# Patient Record
Sex: Male | Born: 1960 | Race: White | Hispanic: No | Marital: Married | Smoking: Never smoker
Health system: Southern US, Community
[De-identification: ages and names within clinical notes are randomized; demographics above are authoritative.]

## PROBLEM LIST (undated history)

## (undated) DIAGNOSIS — J189 Pneumonia, unspecified organism: Secondary | ICD-10-CM

## (undated) DIAGNOSIS — G4733 Obstructive sleep apnea (adult) (pediatric): Secondary | ICD-10-CM

## (undated) DIAGNOSIS — K219 Gastro-esophageal reflux disease without esophagitis: Secondary | ICD-10-CM

## (undated) DIAGNOSIS — I10 Essential (primary) hypertension: Secondary | ICD-10-CM

## (undated) DIAGNOSIS — R011 Cardiac murmur, unspecified: Secondary | ICD-10-CM

## (undated) DIAGNOSIS — E785 Hyperlipidemia, unspecified: Secondary | ICD-10-CM

## (undated) HISTORY — DX: Gastro-esophageal reflux disease without esophagitis: K21.9

## (undated) HISTORY — PX: TONSILLECTOMY: SUR1361

## (undated) HISTORY — DX: Hyperlipidemia, unspecified: E78.5

## (undated) HISTORY — DX: Obstructive sleep apnea (adult) (pediatric): G47.33

## (undated) HISTORY — PX: COLONOSCOPY: SHX174

## (undated) HISTORY — DX: Essential (primary) hypertension: I10

---

## 2008-05-17 LAB — HM COLONOSCOPY

## 2008-10-08 LAB — HM COLONOSCOPY: HM Colonoscopy: NORMAL

## 2010-11-15 LAB — HM DIABETES EYE EXAM

## 2011-09-14 ENCOUNTER — Encounter: Payer: 59 | Attending: Internal Medicine | Admitting: *Deleted

## 2011-09-14 ENCOUNTER — Encounter: Payer: Self-pay | Admitting: *Deleted

## 2011-09-14 DIAGNOSIS — E119 Type 2 diabetes mellitus without complications: Secondary | ICD-10-CM | POA: Insufficient documentation

## 2011-09-14 DIAGNOSIS — Z713 Dietary counseling and surveillance: Secondary | ICD-10-CM | POA: Insufficient documentation

## 2011-09-14 NOTE — Progress Notes (Signed)
Intensive Insulin Progress Note:  Patient states he has had Type 1 Diabetes for 20 years, he was diagnosed when in the [facility], and he is currently on Medtronic Revel insulin pump. 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 His family is still in [place] until his child gets out of school for the summer. He has recently increased his activity level and states he is comfortable with carb counting. He states his BG rises during exercise and then drops suddenly about 100 mg/dl.    Current intensive insulin regime is Basal and Bolus insulin through insulin pump. See [facility] Reports for settings  Assessment: A1c: 7.6% Weight: 254.4 #  Intervention: Uploaded pump to [facility] and reviewed reports with patient. Based on his Carb Ratio of 1u / 2.5 grams carbohydrate he is limited to 60 grams per meal without going above the Max Bolus limit of 25 units at a time.   Plan: Aim for 60 grams of Carb per meal both for weight loss and for Max Bolus limitations for now. Consider using Temp Basal after exercise at 75% for about 2 hours to assist with hypoglycemia issues Continue using Bolus Wizard as able so we can track BG data better  Follow Up: Plan to see patient again in 3 weeks for further evaluation and to upload pump to [facility] again

## 2011-09-14 NOTE — Patient Instructions (Signed)
Plan: Aim for 60 grams of Carb per meal Consider using Temp Basal after exercise at 75% for about 2 hours to assist with hypoglycemia issues Continue using Bolus Wizard as able so we can track BG data better

## 2011-09-20 ENCOUNTER — Encounter: Payer: Self-pay | Admitting: Internal Medicine

## 2011-09-20 ENCOUNTER — Ambulatory Visit (INDEPENDENT_AMBULATORY_CARE_PROVIDER_SITE_OTHER): Payer: 59 | Admitting: Internal Medicine

## 2011-09-20 VITALS — BP 108/68 | HR 82 | Temp 98.1°F | Resp 16 | Wt 258.0 lb

## 2011-09-20 DIAGNOSIS — E7800 Pure hypercholesterolemia, unspecified: Secondary | ICD-10-CM

## 2011-09-20 DIAGNOSIS — E119 Type 2 diabetes mellitus without complications: Secondary | ICD-10-CM

## 2011-09-20 DIAGNOSIS — Z0000 Encounter for general adult medical examination without abnormal findings: Secondary | ICD-10-CM | POA: Insufficient documentation

## 2011-09-20 DIAGNOSIS — E785 Hyperlipidemia, unspecified: Secondary | ICD-10-CM | POA: Insufficient documentation

## 2011-09-20 DIAGNOSIS — N400 Enlarged prostate without lower urinary tract symptoms: Secondary | ICD-10-CM | POA: Insufficient documentation

## 2011-09-20 DIAGNOSIS — G4733 Obstructive sleep apnea (adult) (pediatric): Secondary | ICD-10-CM

## 2011-09-20 DIAGNOSIS — E139 Other specified diabetes mellitus without complications: Secondary | ICD-10-CM

## 2011-09-20 LAB — HM DIABETES FOOT EXAM: HM Diabetic Foot Exam: NORMAL

## 2011-09-20 LAB — FECAL OCCULT BLOOD, GUAIAC: Fecal Occult Blood: NEGATIVE

## 2011-09-20 MED ORDER — DUTASTERIDE-TAMSULOSIN HCL 0.5-0.4 MG PO CAPS: 1.0000 | ORAL_CAPSULE | Freq: Every day | ORAL | Status: DC

## 2011-09-20 NOTE — Progress Notes (Signed)
Subjective:    Patient ID: Zachary Graham, male    DOB: [birth year], 51 y.o.   MRN: [id number]  Diabetes He presents for his follow-up diabetic visit. He has type 1 diabetes mellitus. The initial diagnosis of diabetes was made 20 years ago. His disease course has been stable. There are no hypoglycemic associated symptoms. Pertinent negatives for hypoglycemia include no pallor. Pertinent negatives for diabetes include no blurred vision, no chest pain, no fatigue, no foot paresthesias, no foot ulcerations, no polydipsia, no polyphagia, no polyuria, no visual change, no weakness and no weight loss. There are no hypoglycemic complications. Symptoms are stable. There are no diabetic complications. Current diabetic treatment includes intensive insulin program and oral agent (dual therapy). He is compliant with treatment all of the time. His weight is increasing steadily. He is following a diabetic diet. Meal planning includes avoidance of concentrated sweets. He has had a previous visit with a dietician. He participates in exercise intermittently. There is no change in his home blood glucose trend. An ACE inhibitor/angiotensin II receptor blocker is being taken. He does not see a podiatrist.Eye exam is current.  Benign Prostatic Hypertrophy This is a recurrent problem. The current episode started more than 1 year ago. The problem has been gradually worsening since onset. Irritative symptoms include nocturia. Irritative symptoms do not include frequency or urgency. Obstructive symptoms include dribbling and incomplete emptying. Obstructive symptoms do not include an intermittent stream, a slower stream, straining or a weak stream. Pertinent negatives include no chills, dysuria, genital pain, hematuria, hesitancy, nausea or vomiting. AUA score is 0-7. He is sexually active. The symptoms are aggravated by nothing. Past treatments include nothing.      Review of Systems  Constitutional: Negative for fever, chills,  weight loss, diaphoresis, activity change, appetite change, fatigue and unexpected weight change.  HENT: Negative.   Eyes: Negative.  Negative for blurred vision.  Respiratory: Positive for apnea (and snoring). Negative for cough, choking, chest tightness, shortness of breath, wheezing and stridor.   Cardiovascular: Negative for chest pain, palpitations and leg swelling.  Gastrointestinal: Negative for nausea, vomiting, abdominal pain, diarrhea, constipation, blood in stool, abdominal distention, anal bleeding and rectal pain.  Genitourinary: Positive for difficulty urinating, incomplete emptying and nocturia. Negative for dysuria, hesitancy, urgency, polyuria, frequency, hematuria, flank pain, decreased urine volume, discharge, penile swelling, scrotal swelling, enuresis, genital sores, penile pain and testicular pain.  Musculoskeletal: Negative for myalgias, back pain, joint swelling, arthralgias and gait problem.  Skin: Negative for color change, pallor, rash and wound.  Neurological: Negative.  Negative for weakness.  Hematological: Negative for polydipsia, polyphagia and adenopathy. Does not bruise/bleed easily.  Psychiatric/Behavioral: Negative.        Objective:   Physical Exam  Vitals reviewed. Constitutional: He is oriented to person, place, and time. He appears well-developed and well-nourished. No distress.  HENT:  Head: Normocephalic and atraumatic.  Mouth/Throat: Oropharynx is clear and moist. No oropharyngeal exudate.  Eyes: Conjunctivae are normal. Right eye exhibits no discharge. Left eye exhibits no discharge. No scleral icterus.  Neck: Normal range of motion. Neck supple. No JVD present. No tracheal deviation present. No thyromegaly present.  Cardiovascular: Normal rate, regular rhythm, normal heart sounds and intact distal pulses.  Exam reveals no gallop and no friction rub.   No murmur heard. Pulmonary/Chest: Effort normal and breath sounds normal. No stridor. No  respiratory distress. He has no wheezes. He has no rales. He exhibits no tenderness.  Abdominal: Soft. Bowel sounds are normal. He exhibits no  distension and no mass. There is no tenderness. There is no rebound and no guarding. Hernia confirmed negative in the right inguinal area and confirmed negative in the left inguinal area.  Genitourinary: Rectum normal, testes normal and penis normal. Rectal exam shows no external hemorrhoid, no internal hemorrhoid, no fissure, no mass, no tenderness and anal tone normal. Guaiac negative stool. Prostate is enlarged (2+ smooth bilateral BPH). Prostate is not tender. Right testis shows no mass, no swelling and no tenderness. Right testis is descended. Left testis shows no mass, no swelling and no tenderness. Left testis is descended. Circumcised. No penile tenderness. No discharge found.  Musculoskeletal: Normal range of motion. He exhibits no edema and no tenderness.  Lymphadenopathy:    He has no cervical adenopathy.       Right: No inguinal adenopathy present.       Left: No inguinal adenopathy present.  Neurological: He is oriented to person, place, and time.  Skin: Skin is warm and dry. No rash noted. He is not diaphoretic. No erythema. No pallor.  Psychiatric: He has a normal mood and affect. His behavior is normal. Judgment and thought content normal.         Assessment & Plan:

## 2011-09-20 NOTE — Patient Instructions (Signed)
Health Maintenance, Males A healthy lifestyle and preventative care can promote health and wellness.  Maintain regular health, dental, and eye exams.   Eat a healthy diet. Foods like vegetables, fruits, whole grains, low-fat dairy products, and lean protein foods contain the nutrients you need without too many calories. Decrease your intake of foods high in solid fats, added sugars, and salt. Get information about a proper diet from your caregiver, if necessary.   Regular physical exercise is one of the most important things you can do for your health. Most adults should get at least 150 minutes of moderate-intensity exercise (any activity that increases your heart rate and causes you to sweat) each week. In addition, most adults need muscle-strengthening exercises on 2 or more days a week.    Maintain a healthy weight. The body mass index (BMI) is a screening tool to identify possible weight problems. It provides an estimate of body fat based on height and weight. Your caregiver can help determine your BMI, and can help you achieve or maintain a healthy weight. For adults 20 years and older:   A BMI below 18.5 is considered underweight.   A BMI of 18.5 to 24.9 is normal.   A BMI of 25 to 29.9 is considered overweight.   A BMI of 30 and above is considered obese.   Maintain normal blood lipids and cholesterol by exercising and minimizing your intake of saturated fat. Eat a balanced diet with plenty of fruits and vegetables. Blood tests for lipids and cholesterol should begin at age 20 and be repeated every 5 years. If your lipid or cholesterol levels are high, you are over 50, or you are a high risk for heart disease, you may need your cholesterol levels checked more frequently.Ongoing high lipid and cholesterol levels should be treated with medicines, if diet and exercise are not effective.   If you smoke, find out from your caregiver how to quit. If you do not use tobacco, do not start.    If you choose to drink alcohol, do not exceed 2 drinks per day. One drink is considered to be 12 ounces (355 mL) of beer, 5 ounces (148 mL) of wine, or 1.5 ounces (44 mL) of liquor.   Avoid use of street drugs. Do not share needles with anyone. Ask for help if you need support or instructions about stopping the use of drugs.   High blood pressure causes heart disease and increases the risk of stroke. Blood pressure should be checked at least every 1 to 2 years. Ongoing high blood pressure should be treated with medicines if weight loss and exercise are not effective.   If you are 45 to 49 years old, ask your caregiver if you should take aspirin to prevent heart disease.   Diabetes screening involves taking a blood sample to check your fasting blood sugar level. This should be done once every 3 years, after age 45, if you are within normal weight and without risk factors for diabetes. Testing should be considered at a younger age or be carried out more frequently if you are overweight and have at least 1 risk factor for diabetes.   Colorectal cancer can be detected and often prevented. Most routine colorectal cancer screening begins at the age of 50 and continues through age 75. However, your caregiver may recommend screening at an earlier age if you have risk factors for colon cancer. On a yearly basis, your caregiver may provide home test kits to check for hidden   blood in the stool. Use of a small camera at the end of a tube, to directly examine the colon (sigmoidoscopy or colonoscopy), can detect the earliest forms of colorectal cancer. Talk to your caregiver about this at age 19, when routine screening begins. Direct examination of the colon should be repeated every 5 to 10 years through age 83, unless early forms of pre-cancerous polyps or small growths are found.   Hepatitis C blood testing is recommended for all people born from [birth year] through 1963 and any individual with known risks for  hepatitis C.   Healthy men should no longer receive prostate-specific antigen (PSA) blood tests as part of routine cancer screening. Consult with your caregiver about prostate cancer screening.   Testicular cancer screening is not recommended for adolescents or adult males who have no symptoms. Screening includes self-exam, caregiver exam, and other screening tests. Consult with your caregiver about any symptoms you have or any concerns you have about testicular cancer.   Practice safe sex. Use condoms and avoid high-risk sexual practices to reduce the spread of sexually transmitted infections (STIs).   Use sunscreen with a sun protection factor (SPF) of 30 or greater. Apply sunscreen liberally and repeatedly throughout the day. You should seek shade when your shadow is shorter than you. Protect yourself by wearing long sleeves, pants, a wide-brimmed hat, and sunglasses year round, whenever you are outdoors.   Notify your caregiver of new moles or changes in moles, especially if there is a change in shape or color. Also notify your caregiver if a mole is larger than the size of a pencil eraser.   A one-time screening for abdominal aortic aneurysm (AAA) and surgical repair of large AAAs by sound wave imaging (ultrasonography) is recommended for ages 101 to 63 years who are current or former smokers.   Stay current with your immunizations.  Document Released: 10/30/2007 Document Revised: 04/22/2011 Document Reviewed: 09/28/2010 [facility] Patient Information 2012 [place], [place].Diabetes, Type 1 Diabetes is a long-term (chronic) disease. It occurs when the cells in the pancreas that make insulin (a hormone) are destroyed and can no longer make insulin. Type 1 diabetes was also previously called juvenile-onset diabetes. It most often occurs before the age of 38, but it can also occur in older people. CAUSES  Among other factors, the following may cause type 1 diabetes:  Genetics. This means it may be  passed to you by your parents.   The beta cells that make insulin are destroyed. The cause of this is unknown.  SYMPTOMS   Urinating more than usual (or bed-wetting in children).   Drinking more than usual.   Irritability.   Feeling very hungry.   Weight loss (may be rapid).   Nausea and vomiting.   Abdominal pain.   Feeling more tired than usual (fatigue).   Rapid breathing.   Difficulty staying awake.   Night sweats.  DIAGNOSIS  Your blood is tested to determine whether you have type 1 diabetes. TREATMENT   You will need to check your blood glucose (sugar) levels several times a day.   You will need to balance insulin, a healthy meal plan, and exercise to maintain normal blood glucose.   Education and ongoing support is recommended.   You should have regular checkups and immunizations.  HOME CARE INSTRUCTIONS   Never run out of insulin. It is needed every day.   Do not skip insulin doses.   Do not skip meals. Eat healthy.   Follow your treatment and  monitoring plan.   Wear a pendant or bracelet stating you have diabetes and take insulin.   If you start a new exercise or sport, watch for low blood glucose (hypoglycemia) symptoms. Insulin dosing may need to be adjusted.   Follow up with your caregiver regularly.   Tell your workplace or school about your diabetes treatment plan.  SEEK MEDICAL CARE IF:   You have problems keeping your blood glucose in target range.   You have blood glucose readings that are often too high or too low.   You have problems with your medicines.   You have symptoms of an illness that do not improve after 24 hours.   You have a sore or wound that is not healing.   You notice a change in vision or a new problem with your vision.   You have a fever.  MAKE SURE YOU:  Understand these instructions.   Will watch your condition.   Will get help right away if you are not doing well or get worse.  Document Released:  04/30/2000 Document Revised: 04/22/2011 Document Reviewed: 10/19/2010 [facility] Patient Information 2012 [place], [place].Health Maintenance, Males A healthy lifestyle and preventative care can promote health and wellness.  Maintain regular health, dental, and eye exams.   Eat a healthy diet. Foods like vegetables, fruits, whole grains, low-fat dairy products, and lean protein foods contain the nutrients you need without too many calories. Decrease your intake of foods high in solid fats, added sugars, and salt. Get information about a proper diet from your caregiver, if necessary.   Regular physical exercise is one of the most important things you can do for your health. Most adults should get at least 150 minutes of moderate-intensity exercise (any activity that increases your heart rate and causes you to sweat) each week. In addition, most adults need muscle-strengthening exercises on 2 or more days a week.    Maintain a healthy weight. The body mass index (BMI) is a screening tool to identify possible weight problems. It provides an estimate of body fat based on height and weight. Your caregiver can help determine your BMI, and can help you achieve or maintain a healthy weight. For adults 20 years and older:   A BMI below 18.5 is considered underweight.   A BMI of 18.5 to 24.9 is normal.   A BMI of 25 to 29.9 is considered overweight.   A BMI of 30 and above is considered obese.   Maintain normal blood lipids and cholesterol by exercising and minimizing your intake of saturated fat. Eat a balanced diet with plenty of fruits and vegetables. Blood tests for lipids and cholesterol should begin at age 40 and be repeated every 5 years. If your lipid or cholesterol levels are high, you are over 50, or you are a high risk for heart disease, you may need your cholesterol levels checked more frequently.Ongoing high lipid and cholesterol levels should be treated with medicines, if diet and exercise are  not effective.   If you smoke, find out from your caregiver how to quit. If you do not use tobacco, do not start.   If you choose to drink alcohol, do not exceed 2 drinks per day. One drink is considered to be 12 ounces (355 mL) of beer, 5 ounces (148 mL) of wine, or 1.5 ounces (44 mL) of liquor.   Avoid use of street drugs. Do not share needles with anyone. Ask for help if you need support or instructions about stopping  the use of drugs.   High blood pressure causes heart disease and increases the risk of stroke. Blood pressure should be checked at least every 1 to 2 years. Ongoing high blood pressure should be treated with medicines if weight loss and exercise are not effective.   If you are 40 to 49 years old, ask your caregiver if you should take aspirin to prevent heart disease.   Diabetes screening involves taking a blood sample to check your fasting blood sugar level. This should be done once every 3 years, after age 40, if you are within normal weight and without risk factors for diabetes. Testing should be considered at a younger age or be carried out more frequently if you are overweight and have at least 1 risk factor for diabetes.   Colorectal cancer can be detected and often prevented. Most routine colorectal cancer screening begins at the age of 61 and continues through age 41. However, your caregiver may recommend screening at an earlier age if you have risk factors for colon cancer. On a yearly basis, your caregiver may provide home test kits to check for hidden blood in the stool. Use of a small camera at the end of a tube, to directly examine the colon (sigmoidoscopy or colonoscopy), can detect the earliest forms of colorectal cancer. Talk to your caregiver about this at age 49, when routine screening begins. Direct examination of the colon should be repeated every 5 to 10 years through age 27, unless early forms of pre-cancerous polyps or small growths are found.   Hepatitis C  blood testing is recommended for all people born from [birth year] through 1963 and any individual with known risks for hepatitis C.   Healthy men should no longer receive prostate-specific antigen (PSA) blood tests as part of routine cancer screening. Consult with your caregiver about prostate cancer screening.   Testicular cancer screening is not recommended for adolescents or adult males who have no symptoms. Screening includes self-exam, caregiver exam, and other screening tests. Consult with your caregiver about any symptoms you have or any concerns you have about testicular cancer.   Practice safe sex. Use condoms and avoid high-risk sexual practices to reduce the spread of sexually transmitted infections (STIs).   Use sunscreen with a sun protection factor (SPF) of 30 or greater. Apply sunscreen liberally and repeatedly throughout the day. You should seek shade when your shadow is shorter than you. Protect yourself by wearing long sleeves, pants, a wide-brimmed hat, and sunglasses year round, whenever you are outdoors.   Notify your caregiver of new moles or changes in moles, especially if there is a change in shape or color. Also notify your caregiver if a mole is larger than the size of a pencil eraser.   A one-time screening for abdominal aortic aneurysm (AAA) and surgical repair of large AAAs by sound wave imaging (ultrasonography) is recommended for ages 1 to 3 years who are current or former smokers.   Stay current with your immunizations.  Document Released: 10/30/2007 Document Revised: 04/22/2011 Document Reviewed: 09/28/2010 [facility] Patient Information 2012 [place], [place].

## 2011-09-21 ENCOUNTER — Encounter: Payer: Self-pay | Admitting: Internal Medicine

## 2011-09-21 ENCOUNTER — Other Ambulatory Visit (INDEPENDENT_AMBULATORY_CARE_PROVIDER_SITE_OTHER): Payer: 59

## 2011-09-21 DIAGNOSIS — E7800 Pure hypercholesterolemia, unspecified: Secondary | ICD-10-CM

## 2011-09-21 DIAGNOSIS — E139 Other specified diabetes mellitus without complications: Secondary | ICD-10-CM

## 2011-09-21 DIAGNOSIS — Z0000 Encounter for general adult medical examination without abnormal findings: Secondary | ICD-10-CM

## 2011-09-21 DIAGNOSIS — E119 Type 2 diabetes mellitus without complications: Secondary | ICD-10-CM

## 2011-09-21 DIAGNOSIS — N400 Enlarged prostate without lower urinary tract symptoms: Secondary | ICD-10-CM

## 2011-09-21 DIAGNOSIS — G4733 Obstructive sleep apnea (adult) (pediatric): Secondary | ICD-10-CM

## 2011-09-21 LAB — MICROALBUMIN / CREATININE URINE RATIO
Creatinine,U: 133.4 mg/dL
Microalb, Ur: 0.9 mg/dL (ref 0.0–1.9)

## 2011-09-21 LAB — CBC WITH DIFFERENTIAL/PLATELET
Basophils Absolute: 0 10*3/uL (ref 0.0–0.1)
Eosinophils Absolute: 0.4 10*3/uL (ref 0.0–0.7)
Hemoglobin: 14 g/dL (ref 13.0–17.0)
Lymphocytes Relative: 34.4 % (ref 12.0–46.0)
MCHC: 33.3 g/dL (ref 30.0–36.0)
Monocytes Relative: 8.5 % (ref 3.0–12.0)
Neutro Abs: 3.8 10*3/uL (ref 1.4–7.7)
Neutrophils Relative %: 51.1 % (ref 43.0–77.0)
RBC: 5 Mil/uL (ref 4.22–5.81)
RDW: 14 % (ref 11.5–14.6)

## 2011-09-21 LAB — URINALYSIS, ROUTINE W REFLEX MICROSCOPIC
Hgb urine dipstick: NEGATIVE
Nitrite: NEGATIVE
Specific Gravity, Urine: 1.015 (ref 1.000–1.030)
Total Protein, Urine: NEGATIVE
Urine Glucose: NEGATIVE
pH: 6.5 (ref 5.0–8.0)

## 2011-09-21 LAB — COMPREHENSIVE METABOLIC PANEL
ALT: 32 U/L (ref 0–53)
AST: 20 U/L (ref 0–37)
Albumin: 3.7 g/dL (ref 3.5–5.2)
CO2: 27 mEq/L (ref 19–32)
Calcium: 9.5 mg/dL (ref 8.4–10.5)
Chloride: 100 mEq/L (ref 96–112)
GFR: 116.12 mL/min (ref >60.00)
Potassium: 4.7 mEq/L (ref 3.5–5.1)
Total Protein: 6.7 g/dL (ref 6.0–8.3)

## 2011-09-21 LAB — LDL CHOLESTEROL, DIRECT: Direct LDL: 86.1 mg/dL

## 2011-09-21 LAB — TSH: TSH: 4.75 u[IU]/mL (ref 0.35–5.50)

## 2011-09-21 NOTE — Assessment & Plan Note (Signed)
I will check his FLP 

## 2011-09-21 NOTE — Assessment & Plan Note (Signed)
I have asked him to see Pulm for an eval of his CPAP

## 2011-09-21 NOTE — Assessment & Plan Note (Signed)
He needs an ENDO eval for his insulin pump, today I will check his A1C and his renal function

## 2011-09-21 NOTE — Assessment & Plan Note (Signed)
Exam done, vaccines were updated, labs ordered, pt ed material was given 

## 2011-09-21 NOTE — Assessment & Plan Note (Signed)
I will check his PSA and have asked him to start [facility]

## 2011-09-28 ENCOUNTER — Encounter: Payer: 59 | Attending: Internal Medicine | Admitting: *Deleted

## 2011-09-28 DIAGNOSIS — E119 Type 2 diabetes mellitus without complications: Secondary | ICD-10-CM | POA: Insufficient documentation

## 2011-09-28 DIAGNOSIS — E139 Other specified diabetes mellitus without complications: Secondary | ICD-10-CM

## 2011-09-28 DIAGNOSIS — Z713 Dietary counseling and surveillance: Secondary | ICD-10-CM | POA: Insufficient documentation

## 2011-09-28 NOTE — Progress Notes (Signed)
Intensive Insulin Progress Note: Patient here for follow up visit for diabetes education while on insulin pump. His [place] still out of state. He has continued with his increased activity level and there are no episodes of hypoglycemia on [facility] Reports for the past 2 weeks.   Current intensive insulin regime is Basal and Bolus insulin through insulin pump. See [facility] Reports for settings  Intervention: Uploaded pump to [facility] and reviewed reports with patient. He continues to use Bolus Wizard as able with limitation of Max Bolus of 25 units. Reviewed use of Temp Basal for increased activity and prevention of hypoglycemia.  Plan: Continue to aim for 45-60 grams of Carb per meal Consider using Temp Basal during exercise at 110% to address rise in BG with increased activity  Consider using Temp Basal after exercise at 50-75% to address drop in BG immediately after activity for about 2 hours to assist with hypoglycemia issues Continue using Bolus Wizard as able so we can continue to track BG data  Follow Up: Plan to see patient again after his next appointment with MD

## 2011-10-01 ENCOUNTER — Ambulatory Visit (INDEPENDENT_AMBULATORY_CARE_PROVIDER_SITE_OTHER): Payer: 59 | Admitting: Endocrinology

## 2011-10-01 ENCOUNTER — Encounter: Payer: Self-pay | Admitting: Endocrinology

## 2011-10-01 VITALS — BP 100/62 | HR 82 | Temp 97.0°F | Resp 16 | Wt 253.0 lb

## 2011-10-01 DIAGNOSIS — E119 Type 2 diabetes mellitus without complications: Secondary | ICD-10-CM

## 2011-10-01 DIAGNOSIS — E139 Other specified diabetes mellitus without complications: Secondary | ICD-10-CM

## 2011-10-01 MED ORDER — GLUCOSE BLOOD VI STRP: ORAL_STRIP | Status: DC

## 2011-10-01 NOTE — Patient Instructions (Addendum)
good diet and exercise habits significanly improve the control of your diabetes.  please let me know if you wish to be referred to a dietician, or for weight-lodd surgery.  high blood sugar is very risky to your health.  you should see an eye doctor every year. controlling your blood pressure and cholesterol drastically reduces the damage diabetes does to your body.  this also applies to quitting smoking.  please discuss these with your doctor.  you should take an aspirin every day, unless you have been advised by a doctor not to. check your blood sugar 6 times a day--before the 3 meals, and at bedtime.  also check if you have symptoms of your blood sugar being too high or too low.  please keep a record of the readings and bring it to your next appointment here.  please call us sooner if your blood sugar goes below 70, or if it stays over 200.  Reduce basal rate to 4 units/hr, 24 hrs a day.  continue mealtime bolus of 1 unit/2.5 grams carbohydrate. continue correction bolus (which some people call "sensitivity," or "insulin sensitivity ratio," or just "isr") of 1 unit for each 10 by which your glucose exceeds 100. Please come back for a follow-up appointment in 3 months. You should consider a continuous glucose monitor.  Call if you want to see a diabetes educator for this.

## 2011-10-01 NOTE — Progress Notes (Signed)
Subjective:    Patient ID: Zachary Graham, male    DOB: 04/22/1961, 51 y.o.   MRN: [id number]  HPI pt states 20 years h/o dm.  It started when he has an acute viral illness. 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  he has been on insulin since dx. He has been on pump rx x 7 years.  He averages 250-290 units of novolog per day, via his pump.  pt says his diet and exercise are much better recently.  Since then, cbg's have been lower.  He has few years of slight numbness of the feet, but no assoc pain.   He has 5 different basal rates Past Medical History  Diagnosis Date  . Diabetes mellitus   . GERD (gastroesophageal reflux disease)   . Hypertension   . Hyperlipidemia   . OSA (obstructive sleep apnea)    No past surgical history on file.  History   Social History  . Marital Status: Married    Spouse Name: N/A    Number of Children: N/A  . Years of Education: N/A   Occupational History  . Not on file.   Social History Main Topics  . Smoking status: Never Smoker   . Smokeless tobacco: Never Used  . Alcohol Use: No  . Drug Use: No  . Sexually Active: Yes   Other Topics Concern  . Not on file   Social History Narrative  . No narrative on file    Current Outpatient Prescriptions on File Prior to Visit  Medication Sig Dispense Refill  . Dutasteride-Tamsulosin HCl 0.5-0.4 MG CAPS Take 1 capsule by mouth daily.  90 capsule  3  . Insulin Infusion Pump DEVI by Does not apply route. novolog      . losartan (COZAAR) 50 MG tablet Take 50 mg by mouth daily.      . metFORMIN (GLUCOPHAGE) 500 MG tablet Take 500 mg by mouth 2 (two) times daily with a meal.      . pantoprazole (PROTONIX) 40 MG tablet Take 40 mg by mouth daily.      . simvastatin (ZOCOR) 40 MG tablet Take 40 mg by mouth daily.        Allergies  Allergen Reactions  . Lisinopril   . Penicillins   . Shellfish Allergy     Family History  Problem Relation Age of Onset  . Colon cancer Maternal Grandfather    . Colon cancer Paternal Grandfather   . Alcohol abuse Neg Hx   . Asthma Neg Hx   . Diabetes Neg Hx   . Heart disease Neg Hx   . Hyperlipidemia Neg Hx   . Hypertension Neg Hx   . Kidney disease Neg Hx   . Stroke Neg Hx   DM: none  BP 100/62  Pulse 82  Temp(Src) 97 F (36.1 C) (Oral)  Resp 16  Wt 253 lb (114.76 kg)  SpO2 98%  Review of Systems denies weight loss, blurry vision, headache, chest pain, sob, n/v, urinary frequency, cramps, excessive diaphoresis, memory loss, depression, rhinorrhea, and easy bruising.  He has ED sxs, nocturia, and arthralgias.    Objective:   Physical Exam VS: see vs page GEN: no distress HEAD: head: no deformity eyes: no periorbital swelling, no proptosis external nose and ears are normal mouth: no lesion seen NECK: supple, thyroid is not enlarged CHEST WALL: no deformity LUNGS: clear to auscultation BREASTS:  No gynecomastia CV: reg rate and rhythm, no murmur ABD: abdomen is soft, nontender.  no hepatosplenomegaly.  not distended.  no hernia MUSCULOSKELETAL: muscle bulk and strength are grossly normal.  no obvious joint swelling.  gait is normal and steady EXTEMITIES: no deformity.  no ulcer on the feet.  feet are of normal color and temp.  no edema PULSES: dorsalis pedis intact bilat.  no carotid bruit NEURO:  cn 2-12 grossly intact.   readily moves all 4's.  sensation is intact to touch on the feet SKIN:  Normal texture and temperature.  No rash or suspicious lesion is visible.   NODES:  None palpable at the neck. PSYCH: alert, oriented x3.  Does not appear anxious nor depressed.  Lab Results  Component Value Date   HGBA1C 8.2* 09/21/2011      Assessment & Plan:  DM.  needs increased rx Foot numbness, prob due to DM Obesity.  This exacerbates DM and sleep apnea

## 2011-10-04 NOTE — Patient Instructions (Signed)
Plan: Continue to aim for 45-60 grams of Carb per meal Consider using Temp Basal during exercise at 110% to address rise in BG with increased activity  Consider using Temp Basal after exercise at 50-75% to address drop in BG immediately after activity for about 2 hours to assist with hypoglycemia issues Continue using Bolus Wizard as able so we can continue to track BG data

## 2011-10-06 ENCOUNTER — Other Ambulatory Visit: Payer: Self-pay

## 2011-10-06 NOTE — Telephone Encounter (Signed)
Patient requesting refill on Novolog insulin and send to [facility].

## 2011-10-06 NOTE — Telephone Encounter (Signed)
Please advise on sig thanks

## 2011-10-07 MED ORDER — INSULIN ASPART 100 UNIT/ML ~~LOC~~ SOLN: SUBCUTANEOUS | Status: DC

## 2011-10-07 NOTE — Telephone Encounter (Signed)
Please help on this [facility]

## 2011-10-07 NOTE — Telephone Encounter (Signed)
i sent

## 2011-10-07 NOTE — Telephone Encounter (Signed)
Left message informing pt rx sent to [facility] pharmacy and to callback office with any questions/concerns.

## 2011-10-15 ENCOUNTER — Institutional Professional Consult (permissible substitution): Payer: 59 | Admitting: Pulmonary Disease

## 2011-11-04 ENCOUNTER — Encounter: Payer: Self-pay | Admitting: Pulmonary Disease

## 2011-11-04 ENCOUNTER — Ambulatory Visit (INDEPENDENT_AMBULATORY_CARE_PROVIDER_SITE_OTHER): Payer: 59 | Admitting: Pulmonary Disease

## 2011-11-04 VITALS — BP 122/66 | HR 73 | Temp 97.6°F | Ht 70.0 in | Wt 244.5 lb

## 2011-11-04 DIAGNOSIS — G4733 Obstructive sleep apnea (adult) (pediatric): Secondary | ICD-10-CM

## 2011-11-04 NOTE — Patient Instructions (Addendum)
Continue on cpap at your current setting.  Please call if having issues with pressure, mask fit, or symptoms Continue working on weight loss Will give you a prescription for a new mask from the [place], but let me know if you ever decide to move locally for your equipment.  followup with me in one year Can make an appointment for allergy evaluation at the desk.

## 2011-11-04 NOTE — Assessment & Plan Note (Signed)
The patient was diagnosed with obstructive sleep apnea last year, and has been doing well on CPAP.  I will need to get his study from [place] for documentation.  He is doing well on the device, and his download shows good compliance with adequate control of his events.  He is having some mild mask leak, but is overdue for a new CPAP mask.  The patient has also lost weight since his last visit.  I have asked him to keep up with mask changes and supplies, and to continue working on weight loss.  If he is doing well, will followup in one year.

## 2011-11-04 NOTE — Progress Notes (Signed)
Subjective:    Patient ID: Zachary Graham, male    DOB: 1960/08/31, 51 y.o.   MRN: [id number]  HPI The patient is a 51 year old male who been asked to see for management of a truck of sleep apnea.  He states that he was diagnosed with sleep apnea approximately one year ago while living in [place], and he has been on CPAP since that time.  He uses a full face mask, as well as a heated humidifier.  He has rare snoring breakthrough, but feels that his doing well with the device.  He is sleeping well, and is much more rested during the day.  I have downloaded his machine which shows that he is on an automatic setting with good control of his AHI.  He has excellent compliance, and his only area of concern is an increased mask leak noted.  The patient states that his mask is over 29-year-old, and obviously needs to be replaced.  The patient denies any issues with daytime sleepiness, and states that his weight has actually decreased 30 pounds since diagnosis.  His Epworth score today is only one.  Sleep Questionnaire: What time do you typically go to bed?( Between what hours) 11pm - 12am How long does it take you to fall asleep? 10-15 minutes How many times during the night do you wake up? 2 What time do you get out of bed to start your day? 0630 Do you drive or operate heavy machinery in your occupation? No How much has your weight changed (up or down) over the past two years? (In pounds) 30 lb (13.608 kg) Have you ever had a sleep study before? Yes If yes, location of study? [facility] in [place], [place] If yes, date of study? Do you currently use CPAP? Yes If so, what pressure? Do you wear oxygen at any time? No    Review of Systems  Constitutional: Negative.  Negative for fever and unexpected weight change.  HENT: Negative.  Negative for ear pain, nosebleeds, congestion, sore throat, rhinorrhea, sneezing, trouble swallowing, dental problem, postnasal drip and sinus pressure.   Eyes: Negative.   Negative for redness and itching.  Respiratory: Negative.  Negative for cough, chest tightness, shortness of breath and wheezing.   Cardiovascular: Negative.  Negative for palpitations and leg swelling.  Gastrointestinal: Negative.  Negative for nausea and vomiting.  Genitourinary: Negative.  Negative for dysuria.  Musculoskeletal: Negative.  Negative for joint swelling.  Skin: Negative.  Negative for rash.  Neurological: Negative.  Negative for headaches.  Hematological: Negative.  Does not bruise/bleed easily.  Psychiatric/Behavioral: Negative.  Negative for dysphoric mood. The patient is not nervous/anxious.        Objective:   Physical Exam Constitutional:  Overweight male, no acute distress  HENT:  Nares patent without discharge, but narrowed.  Oropharynx without exudate, palate and uvula are mildly elongated.  Small oral cavity.  Eyes:  Perrla, eomi, no scleral icterus  Neck:  No JVD, no TMG  Cardiovascular:  Normal rate, regular rhythm, no rubs or gallops.  No murmurs        Intact distal pulses  Pulmonary :  Normal breath sounds, no stridor or respiratory distress   No rales, rhonchi, or wheezing  Abdominal:  Soft, nondistended, bowel sounds present.  No tenderness noted.   Musculoskeletal:  No lower extremity edema noted.  Lymph Nodes:  No cervical lymphadenopathy noted  Skin:  No cyanosis noted  Neurologic:  Alert, appropriate, moves all 4 extremities without obvious deficit.  Assessment & Plan:

## 2011-11-08 ENCOUNTER — Telehealth: Payer: Self-pay | Admitting: Pulmonary Disease

## 2011-11-08 NOTE — Telephone Encounter (Signed)
Forward to Dr. Shelle Iron for review on 11-08-11 ym

## 2011-12-20 ENCOUNTER — Ambulatory Visit: Payer: 59 | Admitting: Internal Medicine

## 2012-01-07 ENCOUNTER — Ambulatory Visit: Payer: 59 | Admitting: Internal Medicine

## 2012-01-07 ENCOUNTER — Ambulatory Visit: Payer: 59 | Admitting: Endocrinology

## 2012-01-21 ENCOUNTER — Other Ambulatory Visit (INDEPENDENT_AMBULATORY_CARE_PROVIDER_SITE_OTHER): Payer: 59

## 2012-01-21 ENCOUNTER — Encounter: Payer: Self-pay | Admitting: Endocrinology

## 2012-01-21 ENCOUNTER — Ambulatory Visit (INDEPENDENT_AMBULATORY_CARE_PROVIDER_SITE_OTHER): Payer: 59 | Admitting: Internal Medicine

## 2012-01-21 ENCOUNTER — Ambulatory Visit (INDEPENDENT_AMBULATORY_CARE_PROVIDER_SITE_OTHER): Payer: 59 | Admitting: Endocrinology

## 2012-01-21 VITALS — BP 118/64 | HR 60 | Temp 97.2°F | Resp 16 | Wt 228.0 lb

## 2012-01-21 VITALS — BP 118/64 | HR 56 | Temp 97.2°F | Ht 69.5 in | Wt 228.0 lb

## 2012-01-21 DIAGNOSIS — E119 Type 2 diabetes mellitus without complications: Secondary | ICD-10-CM

## 2012-01-21 DIAGNOSIS — E139 Other specified diabetes mellitus without complications: Secondary | ICD-10-CM

## 2012-01-21 LAB — BASIC METABOLIC PANEL
BUN: 10 mg/dL (ref 6–23)
Creatinine, Ser: 0.8 mg/dL (ref 0.4–1.5)
GFR: 103.31 mL/min (ref >60.00)
Glucose, Bld: 69 mg/dL — ABNORMAL LOW (ref 70–99)

## 2012-01-21 NOTE — Progress Notes (Signed)
Subjective:    Patient ID: Zachary Graham, male    DOB: May 15, 1961, 51 y.o.   MRN: [id number]  Diabetes He presents for his follow-up diabetic visit. He has type 2 diabetes mellitus. His disease course has been improving. There are no hypoglycemic associated symptoms. There are no diabetic associated symptoms. There are no hypoglycemic complications. Symptoms are stable. There are no diabetic complications. Current diabetic treatment includes diet, insulin injections, intensive insulin program and oral agent (monotherapy). He is compliant with treatment all of the time. His weight is decreasing steadily. He is following a generally healthy diet. Meal planning includes avoidance of concentrated sweets. He has not had a previous visit with a dietician. He participates in exercise intermittently. His home blood glucose trend is decreasing steadily. His breakfast blood glucose range is generally 70-90 mg/dl. His lunch blood glucose range is generally 90-110 mg/dl. His dinner blood glucose range is generally 130-140 mg/dl. His highest blood glucose is >200 mg/dl. His overall blood glucose range is 130-140 mg/dl. An ACE inhibitor/angiotensin II receptor blocker is being taken. He does not see a podiatrist.Eye exam is current.      Review of Systems  Constitutional: Negative.   HENT: Negative.   Eyes: Negative.   Respiratory: Negative.   Cardiovascular: Negative.   Gastrointestinal: Negative.   Genitourinary: Negative.   Musculoskeletal: Negative.   Skin: Negative.   Neurological: Negative.   Hematological: Negative.   Psychiatric/Behavioral: Negative.        Objective:   Physical Exam  Vitals reviewed. Constitutional: He is oriented to person, place, and time. He appears well-developed and well-nourished. No distress.  HENT:  Head: Normocephalic and atraumatic.  Mouth/Throat: Oropharynx is clear and moist. No oropharyngeal exudate.  Eyes: Conjunctivae are normal. Right eye exhibits no  discharge. Left eye exhibits no discharge. No scleral icterus.  Neck: Normal range of motion. Neck supple. No JVD present. No tracheal deviation present. No thyromegaly present.  Cardiovascular: Normal rate, regular rhythm, normal heart sounds and intact distal pulses.  Exam reveals no gallop and no friction rub.   No murmur heard. Pulmonary/Chest: Effort normal and breath sounds normal. No stridor. No respiratory distress. He has no wheezes. He has no rales. He exhibits no tenderness.  Abdominal: Soft. Bowel sounds are normal. He exhibits no distension and no mass. There is no tenderness. There is no rebound and no guarding.  Musculoskeletal: Normal range of motion. He exhibits no edema and no tenderness.  Lymphadenopathy:    He has no cervical adenopathy.  Neurological: He is oriented to person, place, and time.  Skin: Skin is warm and dry. No rash noted. He is not diaphoretic. No erythema. No pallor.  Psychiatric: He has a normal mood and affect. His behavior is normal. Judgment and thought content normal.     Lab Results  Component Value Date   WBC 7.5 09/21/2011   HGB 14.0 09/21/2011   HCT 42.0 09/21/2011   PLT 231.0 09/21/2011   GLUCOSE 171* 09/21/2011   CHOL 162 09/21/2011   TRIG 335.0* 09/21/2011   HDL 37.30* 09/21/2011   LDLDIRECT 86.1 09/21/2011   ALT 32 09/21/2011   AST 20 09/21/2011   NA 137 09/21/2011   K 4.7 09/21/2011   CL 100 09/21/2011   CREATININE 0.8 09/21/2011   BUN 16 09/21/2011   CO2 27 09/21/2011   TSH 4.75 09/21/2011   PSA 0.31 09/21/2011   HGBA1C 8.2* 09/21/2011   MICROALBUR 0.9 09/21/2011       Assessment & Plan:

## 2012-01-21 NOTE — Patient Instructions (Addendum)
check your blood sugar 6 times a day--before the 3 meals, and at bedtime.  also check if you have symptoms of your blood sugar being too high or too low.  please keep a record of the readings and bring it to your next appointment here.  please call us sooner if your blood sugar goes below 70, or if it stays over 200.  Reduce basal rate to 1.8 units/hr, 24 hrs a day.  continue mealtime bolus of 1 unit/ 2.5 grams carbohydrate.  continue correction bolus (which some people call "sensitivity," or "insulin sensitivity ratio," or just "isr") of 1 unit for each 10 by which your glucose exceeds 100. Please come back for a follow-up appointment in 3 months.   You should consider a continuous glucose monitor.  Call if you want to see a diabetes educator for this.

## 2012-01-21 NOTE — Patient Instructions (Signed)

## 2012-01-21 NOTE — Progress Notes (Signed)
Subjective:    Patient ID: Zachary Graham, male    DOB: Aug 24, 1960, 51 y.o.   MRN: [id number]  HPI pt returns for f/u of type 1 DM (dx'ed 1993; complicated by peripheral sensory neuropathy).  he has been on insulin since dx. He has been on pump rx x 7 years.  He averages 115 units of novolog per day, via his pump.  pt says his diet and exercise are much better recently, and he continues to lose weight.  he has had to reduce the insulin pump settings.  Despite this, he continues to have hypoglycemia qod.  It is otherwise well-controlled.   Past Medical History  Diagnosis Date  . Diabetes mellitus   . GERD (gastroesophageal reflux disease)   . Hypertension   . Hyperlipidemia   . OSA (obstructive sleep apnea)     Past Surgical History  Procedure Date  . No past surgeries     History   Social History  . Marital Status: Married    Spouse Name: N/A    Number of Children: N/A  . Years of Education: N/A   Occupational History  . 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 Smoking status: Never Smoker   . Smokeless tobacco: Never Used  . Alcohol Use: No  . Drug Use: No  . Sexually Active: Yes   Other Topics Concern  . Not on file   Social History Narrative  . No narrative on file    Current Outpatient Prescriptions on File Prior to Visit  Medication Sig Dispense Refill  . glucose blood (ONE TOUCH ULTRA TEST) test strip 6/day, and lancets 250.03  600 each  12  . insulin aspart (NOVOLOG) 100 UNIT/ML injection For use via pump, total 300 units per day  30 vial  3  . Insulin Infusion Pump DEVI by Does not apply route. novolog      . Insulin Infusion Pump Supplies (MINIMED INFUSION SET-MMT 397) MISC 1 Device by Does not apply route daily.      . Insulin Infusion Pump Supplies (PARADIGM RESERVOIR ) MISC 1 Device by Does not apply route daily.      Marland Kitchen losartan (COZAAR) 50 MG tablet Take 50 mg by mouth daily.      . metFORMIN (GLUCOPHAGE) 500 MG tablet Take  500 mg by mouth 2 (two) times daily with a meal.      . pantoprazole (PROTONIX) 40 MG tablet Take 40 mg by mouth daily.      . simvastatin (ZOCOR) 40 MG tablet Take 40 mg by mouth daily.        Allergies  Allergen Reactions  . Lisinopril   . Penicillins   . Shellfish Allergy     Family History  Problem Relation Age of Onset  . Colon cancer Maternal Grandfather   . Colon cancer Paternal Grandfather   . Alcohol abuse Neg Hx   . Asthma Neg Hx   . Diabetes Neg Hx   . Heart disease Father   . Hyperlipidemia Neg Hx   . Hypertension Neg Hx   . Kidney disease Neg Hx   . Stroke Neg Hx     BP 118/64  Pulse 56  Temp 97.2 F (36.2 C) (Oral)  Ht 5' 9.5" (1.765 m)  Wt 228 lb (103.42 kg)  BMI 33.19 kg/m2  SpO2 99%   Review of Systems Denies LOC    Objective:   Physical Exam VITAL SIGNS:  See vs page GENERAL: no  distress SKIN:  Insulin infusion sites at the anterior abdomen are normal  Lab Results  Component Value Date   HGBA1C 6.8* 01/21/2012      Assessment & Plan:  DM: control is improved with weight-loss

## 2012-01-23 ENCOUNTER — Encounter: Payer: Self-pay | Admitting: Internal Medicine

## 2012-01-23 NOTE — Assessment & Plan Note (Signed)
His a1c shows marked improvement, he was praised for his efforts to lose weight, his renal function is stable

## 2012-01-26 ENCOUNTER — Encounter: Payer: 59 | Attending: Internal Medicine | Admitting: *Deleted

## 2012-01-26 VITALS — Ht 69.5 in | Wt 220.9 lb

## 2012-01-26 DIAGNOSIS — E139 Other specified diabetes mellitus without complications: Secondary | ICD-10-CM

## 2012-01-26 DIAGNOSIS — E109 Type 1 diabetes mellitus without complications: Secondary | ICD-10-CM | POA: Insufficient documentation

## 2012-01-26 DIAGNOSIS — Z713 Dietary counseling and surveillance: Secondary | ICD-10-CM | POA: Insufficient documentation

## 2012-01-26 NOTE — Progress Notes (Signed)
Intensive Insulin Progress Note: Patient here for follow up visit for another diabetes education while on insulin pump. [place]. He has continued with his increased activity level, improved eating habits and has been successful with significant weight loss of 34 pounds since April this year. He had decreased his basal rates on his insulin pump due to weight loss and hypoglycemia, no change to bolus settings yet. He has had episodes of hypoglycemia in early AM as well as after lunch supported by [facility] for the past 2 weeks.   Current intensive insulin regime is Basal and Bolus insulin through insulin pump. See [facility] Reports for settings  Intervention: Uploaded pump to [facility] and reviewed reports with patient. Hypoglycemia patterns noted and decreases in basal rates discussed. Also discussed rationale of increasing his carb ratio at breakfast due to hyperglycemia after breakfast meal and decreasing ratio after lunch due to hypoglycemia early afternoons. Reviewed use of Temp Basal for increased activity and prevention of hypoglycemia once again.  Plan: Continue to aim for 45-60 grams of Carb per meal Consider using Temp Basal during exercise at 110% to address rise in BG with increased activity  Consider using Temp Basal after exercise at 50-75% to address drop in BG immediately after activity for about 2 hours to assist with hypoglycemia issues Continue using Bolus Wizard as able so we can continue to track BG data  Follow Up: Plan to see patient again in 3 months.

## 2012-02-21 ENCOUNTER — Encounter: Payer: Self-pay | Admitting: Endocrinology

## 2012-03-29 LAB — HM DIABETES EYE EXAM: HM Diabetic Eye Exam: NORMAL

## 2012-04-04 ENCOUNTER — Other Ambulatory Visit: Payer: Self-pay | Admitting: *Deleted

## 2012-04-04 MED ORDER — PANTOPRAZOLE SODIUM 40 MG PO TBEC: 40.0000 mg | DELAYED_RELEASE_TABLET | Freq: Every day | ORAL | Status: DC

## 2012-04-04 NOTE — Telephone Encounter (Signed)
R'cd fax from [facility] for refill of Pantoprazole.

## 2012-04-17 ENCOUNTER — Other Ambulatory Visit: Payer: Self-pay | Admitting: Orthopedic Surgery

## 2012-04-17 DIAGNOSIS — R52 Pain, unspecified: Secondary | ICD-10-CM

## 2012-04-20 ENCOUNTER — Encounter: Payer: Self-pay | Admitting: Endocrinology

## 2012-04-20 ENCOUNTER — Ambulatory Visit (HOSPITAL_COMMUNITY)
Admission: RE | Admit: 2012-04-20 | Discharge: 2012-04-20 | Disposition: A | Discharge status: Home / Self Care | Payer: 59 | Source: Ambulatory Visit | Attending: Orthopedic Surgery | Admitting: Orthopedic Surgery

## 2012-04-20 ENCOUNTER — Ambulatory Visit (INDEPENDENT_AMBULATORY_CARE_PROVIDER_SITE_OTHER): Payer: 59 | Admitting: Endocrinology

## 2012-04-20 ENCOUNTER — Ambulatory Visit: Payer: 59 | Admitting: Endocrinology

## 2012-04-20 VITALS — BP 120/80 | HR 72 | Temp 98.4°F | Wt 219.0 lb

## 2012-04-20 DIAGNOSIS — E119 Type 2 diabetes mellitus without complications: Secondary | ICD-10-CM

## 2012-04-20 DIAGNOSIS — R911 Solitary pulmonary nodule: Secondary | ICD-10-CM | POA: Insufficient documentation

## 2012-04-20 DIAGNOSIS — M47814 Spondylosis without myelopathy or radiculopathy, thoracic region: Secondary | ICD-10-CM | POA: Insufficient documentation

## 2012-04-20 DIAGNOSIS — E139 Other specified diabetes mellitus without complications: Secondary | ICD-10-CM

## 2012-04-20 DIAGNOSIS — R079 Chest pain, unspecified: Secondary | ICD-10-CM | POA: Insufficient documentation

## 2012-04-20 DIAGNOSIS — R52 Pain, unspecified: Secondary | ICD-10-CM

## 2012-04-20 NOTE — Patient Instructions (Addendum)
check your blood sugar 6 times a day--before the 3 meals, and at bedtime.  also check if you have symptoms of your blood sugar being too high or too low.  please keep a record of the readings and bring it to your next appointment here.  please call us sooner if your blood sugar goes below 70, or if it stays over 200.  Reduce basal rate to 1.7 units/hr, 24 hrs a day.  continue mealtime bolus of 1 unit/ 2.5 grams carbohydrate.  continue correction bolus (which some people call "sensitivity," or "insulin sensitivity ratio," or just "isr") of 1 unit for each 10 by which your glucose exceeds 100. Please come back for a follow-up appointment in 3 months.   blood tests are being requested for you today.  We'll contact you with results.   Please pursue the continuous glucose monitor. Please stop the metformin.

## 2012-04-20 NOTE — Progress Notes (Signed)
  Subjective:    Patient ID: Zachary Graham, male    DOB: 05-Jan-1961, 51 y.o.   MRN: [id number]  HPI pt returns for f/u of type 1 DM (dx'ed 1993; complicated by peripheral sensory neuropathy; he has been on insulin since dx; he has been on pump rx since 2006).  He averages 115 units of novolog per day, via his pump.  he reduced his insulin pump settings at last ov.  Despite this, he continues to have hypoglycemia qod.  This happens most commonly if lunch is delayed.  It is otherwise well-controlled.   Past Medical History  Diagnosis Date  . Diabetes mellitus   . GERD (gastroesophageal reflux disease)   . Hypertension   . Hyperlipidemia   . OSA (obstructive sleep apnea)     Past Surgical History  Procedure Date  . No past surgeries     History   Social History  . Marital Status: Married    Spouse Name: N/A    Number of Children: N/A  . Years of Education: N/A   Occupational History  . 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 Smoking status: Never Smoker   . Smokeless tobacco: Never Used  . Alcohol Use: No  . Drug Use: No  . Sexually Active: Yes   Other Topics Concern  . Not on file   Social History Narrative  . No narrative on file    Current Outpatient Prescriptions on File Prior to Visit  Medication Sig Dispense Refill  . glucose blood (ONE TOUCH ULTRA TEST) test strip 6/day, and lancets 250.03  600 each  12  . insulin aspart (NOVOLOG) 100 UNIT/ML injection For use via pump, total 300 units per day  30 vial  3  . Insulin Infusion Pump DEVI by Does not apply route. novolog      . Insulin Infusion Pump Supplies (MINIMED INFUSION SET-MMT 397) MISC 1 Device by Does not apply route daily.      . Insulin Infusion Pump Supplies (PARADIGM RESERVOIR ) MISC 1 Device by Does not apply route daily.      Marland Kitchen losartan (COZAAR) 50 MG tablet Take 50 mg by mouth daily.      . pantoprazole (PROTONIX) 40 MG tablet Take 1 tablet (40 mg total) by mouth daily.   30 tablet  5  . simvastatin (ZOCOR) 40 MG tablet Take 40 mg by mouth daily.        Allergies  Allergen Reactions  . Lisinopril   . Penicillins   . Shellfish Allergy     Family History  Problem Relation Age of Onset  . Colon cancer Maternal Grandfather   . Colon cancer Paternal Grandfather   . Alcohol abuse Neg Hx   . Asthma Neg Hx   . Diabetes Neg Hx   . Heart disease Father   . Hyperlipidemia Neg Hx   . Hypertension Neg Hx   . Kidney disease Neg Hx   . Stroke Neg Hx     BP 120/80  Pulse 72  Temp 98.4 F (36.9 C) (Oral)  Wt 219 lb (99.338 kg)  SpO2 97%  Review of Systems Denies LOC and weight change.      Objective:   Physical Exam VITAL SIGNS:  See vs page GENERAL: no distress PSYCH: Alert and oriented x 3.  Does not appear anxious nor depressed.     Assessment & Plan:  DM, apparently well-controlled

## 2012-04-25 ENCOUNTER — Ambulatory Visit: Payer: 59 | Admitting: *Deleted

## 2012-05-03 ENCOUNTER — Encounter: Payer: Self-pay | Admitting: *Deleted

## 2012-05-03 ENCOUNTER — Encounter: Payer: 59 | Attending: Internal Medicine | Admitting: *Deleted

## 2012-05-03 VITALS — Ht 69.5 in | Wt 216.1 lb

## 2012-05-03 DIAGNOSIS — E109 Type 1 diabetes mellitus without complications: Secondary | ICD-10-CM | POA: Insufficient documentation

## 2012-05-03 DIAGNOSIS — Z713 Dietary counseling and surveillance: Secondary | ICD-10-CM | POA: Insufficient documentation

## 2012-05-03 DIAGNOSIS — E139 Other specified diabetes mellitus without complications: Secondary | ICD-10-CM

## 2012-05-03 NOTE — Patient Instructions (Addendum)
Plan: Continue to aim for 45-60 grams of Carb per meal Consider using Temp Basal during exercise at 110% to address rise in BG with increased activity  Consider using Temp Basal after exercise at 50-75% to address drop in BG immediately after activity for about 2 hours to assist with hypoglycemia issues Continue using Bolus Wizard as able so we can continue to track BG data Contact [employer] regarding Real Time Continuous Glucose Monitoring Ask [employer] about access to [facility] [employer] for Real Time CGM

## 2012-05-03 NOTE — Progress Notes (Signed)
Intensive Insulin Progress Note: Patient here for follow up visit for diabetes education while on insulin pump. His family is still out of state. He has continued with his increased activity level which is now up to 1 hour every day on the treadmill, improved eating habits and has been successful with significant weight loss of 38 pounds since April this year. His basal rates have been decreased again due to hypoglycemia, but now he states he is having more hyperglycemia, especially in that AM. He also states he wants to discuss obtaining CGM to use in conjunction with his pump to improve his diabetes management.   Current intensive insulin regime is Basal and Bolus insulin through insulin pump. See [facility] Reports for settings  Intervention: Uploaded pump to [facility] and reviewed reports with patient. Patterns indicate a steady rise of about 100 mg/dl in BG overnight, relatively stable BG during the day and a small gradual increase in the evening hours. We discussed potential changes in his Basal Rates which he decided to program into pump during the visit. See below: : 2.00 ( 10% increase) 7AM: 1.80 (no change) 12Noon: 1.35 (no change) 6PM: 1.85 (slight increase) 9:30 PM: 2.25  (slight increase) Also discussed options for contacting [employer] for CGM along with new sensor product; Enlyte. Once ordered, he may set up appointment for training in this office.  Plan: Continue to aim for 45-60 grams of Carb per meal Consider using Temp Basal during exercise at 110% to address rise in BG with increased activity  Consider using Temp Basal after exercise at 50-75% to address drop in BG immediately after activity for about 2 hours to assist with hypoglycemia issues Continue using Bolus Wizard as able so we can continue to track BG data Contact [employer] regarding Real Time Continuous Glucose Monitoring Ask [employer] about access to [facility] for Real Time CGM  Follow Up: Plan to see  patient again in 3 months.

## 2012-05-25 ENCOUNTER — Encounter: Payer: Self-pay | Admitting: Internal Medicine

## 2012-05-25 ENCOUNTER — Ambulatory Visit (INDEPENDENT_AMBULATORY_CARE_PROVIDER_SITE_OTHER): Payer: 59 | Admitting: Internal Medicine

## 2012-05-25 VITALS — BP 110/68 | HR 66 | Temp 97.8°F | Resp 16 | Wt 229.0 lb

## 2012-05-25 DIAGNOSIS — E139 Other specified diabetes mellitus without complications: Secondary | ICD-10-CM

## 2012-05-25 DIAGNOSIS — E7800 Pure hypercholesterolemia, unspecified: Secondary | ICD-10-CM

## 2012-05-25 DIAGNOSIS — I370 Nonrheumatic pulmonary valve stenosis: Secondary | ICD-10-CM | POA: Insufficient documentation

## 2012-05-25 DIAGNOSIS — E781 Pure hyperglyceridemia: Secondary | ICD-10-CM

## 2012-05-25 DIAGNOSIS — E119 Type 2 diabetes mellitus without complications: Secondary | ICD-10-CM

## 2012-05-25 DIAGNOSIS — R011 Cardiac murmur, unspecified: Secondary | ICD-10-CM

## 2012-05-25 NOTE — Patient Instructions (Signed)
Hypertriglyceridemia  Diet for High blood levels of Triglycerides Most fats in food are triglycerides. Triglycerides in your blood are stored as fat in your body. High levels of triglycerides in your blood may put you at a greater risk for heart disease and stroke.  Normal triglyceride levels are less than 150 mg/dL. Borderline high levels are 150-199 mg/dl. High levels are 200 - 499 mg/dL, and very high triglyceride levels are greater than 500 mg/dL. The decision to treat high triglycerides is generally based on the level. For people with borderline or high triglyceride levels, treatment includes weight loss and exercise. Drugs are recommended for people with very high triglyceride levels. Many people who need treatment for high triglyceride levels have metabolic syndrome. This syndrome is a collection of disorders that often include: insulin resistance, high blood pressure, blood clotting problems, high cholesterol and triglycerides. TESTING PROCEDURE FOR TRIGLYCERIDES  You should not eat 4 hours before getting your triglycerides measured. The normal range of triglycerides is between 10 and 250 milligrams per deciliter (mg/dl). Some people may have extreme levels (1000 or above), but your triglyceride level may be too high if it is above 150 mg/dl, depending on what other risk factors you have for heart disease.  People with high blood triglycerides may also have high blood cholesterol levels. If you have high blood cholesterol as well as high blood triglycerides, your risk for heart disease is probably greater than if you only had high triglycerides. High blood cholesterol is one of the main risk factors for heart disease. CHANGING YOUR DIET  Your weight can affect your blood triglyceride level. If you are more than 20% above your ideal body weight, you may be able to lower your blood triglycerides by losing weight. Eating less and exercising regularly is the best way to combat this. Fat provides more  calories than any other food. The best way to lose weight is to eat less fat. Only 30% of your total calories should come from fat. Less than 7% of your diet should come from saturated fat. A diet low in fat and saturated fat is the same as a diet to decrease blood cholesterol. By eating a diet lower in fat, you may lose weight, lower your blood cholesterol, and lower your blood triglyceride level.  Eating a diet low in fat, especially saturated fat, may also help you lower your blood triglyceride level. Ask your dietitian to help you figure how much fat you can eat based on the number of calories your caregiver has prescribed for you.  Exercise, in addition to helping with weight loss may also help lower triglyceride levels.   Alcohol can increase blood triglycerides. You may need to stop drinking alcoholic beverages.  Too much carbohydrate in your diet may also increase your blood triglycerides. Some complex carbohydrates are necessary in your diet. These may include bread, rice, potatoes, other starchy vegetables and cereals.  Reduce "simple" carbohydrates. These may include pure sugars, candy, honey, and jelly without losing other nutrients. If you have the kind of high blood triglycerides that is affected by the amount of carbohydrates in your diet, you will need to eat less sugar and less high-sugar foods. Your caregiver can help you with this.  Adding 2-4 grams of fish oil (EPA+ DHA) may also help lower triglycerides. Speak with your caregiver before adding any supplements to your regimen. Following the Diet  Maintain your ideal weight. Your caregivers can help you with a diet. Generally, eating less food and getting more   exercise will help you lose weight. Joining a weight control group may also help. Ask your caregivers for a good weight control group in your area.  Eat low-fat foods instead of high-fat foods. This can help you lose weight too.  These foods are lower in fat. Eat MORE of these:    Dried beans, peas, and lentils.  Egg whites.  Low-fat cottage cheese.  Fish.  Lean cuts of meat, such as round, sirloin, rump, and flank (cut extra fat off meat you fix).  Whole grain breads, cereals and pasta.  Skim and nonfat dry milk.  Low-fat yogurt.  Poultry without the skin.  Cheese made with skim or part-skim milk, such as mozzarella, parmesan, farmers', ricotta, or pot cheese. These are higher fat foods. Eat LESS of these:   Whole milk and foods made from whole milk, such as American, blue, cheddar, monterey jack, and swiss cheese  High-fat meats, such as luncheon meats, sausages, knockwurst, bratwurst, hot dogs, ribs, corned beef, ground pork, and regular ground beef.  Fried foods. Limit saturated fats in your diet. Substituting unsaturated fat for saturated fat may decrease your blood triglyceride level. You will need to read package labels to know which products contain saturated fats.  These foods are high in saturated fat. Eat LESS of these:   Fried pork skins.  Whole milk.  Skin and fat from poultry.  Palm oil.  Butter.  Shortening.  Cream cheese.  Bacon.  Margarines and baked goods made from listed oils.  Vegetable shortenings.  Chitterlings.  Fat from meats.  Coconut oil.  Palm kernel oil.  Lard.  Cream.  Sour cream.  Fatback.  Coffee whiteners and non-dairy creamers made with these oils.  Cheese made from whole milk. Use unsaturated fats (both polyunsaturated and monounsaturated) moderately. Remember, even though unsaturated fats are better than saturated fats; you still want a diet low in total fat.  These foods are high in unsaturated fat:   Canola oil.  Sunflower oil.  Mayonnaise.  Almonds.  Peanuts.  Pine nuts.  Margarines made with these oils.  Safflower oil.  Olive oil.  Avocados.  Cashews.  Peanut butter.  Sunflower seeds.  Soybean oil.  Peanut  oil.  Olives.  Pecans.  Walnuts.  Pumpkin seeds. Avoid sugar and other high-sugar foods. This will decrease carbohydrates without decreasing other nutrients. Sugar in your food goes rapidly to your blood. When there is excess sugar in your blood, your liver may use it to make more triglycerides. Sugar also contains calories without other important nutrients.  Eat LESS of these:   Sugar, brown sugar, powdered sugar, jam, jelly, preserves, honey, syrup, molasses, pies, candy, cakes, cookies, frosting, pastries, colas, soft drinks, punches, fruit drinks, and regular gelatin.  Avoid alcohol. Alcohol, even more than sugar, may increase blood triglycerides. In addition, alcohol is high in calories and low in nutrients. Ask for sparkling water, or a diet soft drink instead of an alcoholic beverage. Suggestions for planning and preparing meals   Bake, broil, grill or roast meats instead of frying.  Remove fat from meats and skin from poultry before cooking.  Add spices, herbs, lemon juice or vinegar to vegetables instead of salt, rich sauces or gravies.  Use a non-stick skillet without fat or use no-stick sprays.  Cool and refrigerate stews and broth. Then remove the hardened fat floating on the surface before serving.  Refrigerate meat drippings and skim off fat to make low-fat gravies.  Serve more fish.  Use less butter,   margarine and other high-fat spreads on bread or vegetables.  Use skim or reconstituted non-fat dry milk for cooking.  Cook with low-fat cheeses.  Substitute low-fat yogurt or cottage cheese for all or part of the sour cream in recipes for sauces, dips or congealed salads.  Use half yogurt/half mayonnaise in salad recipes.  Substitute evaporated skim milk for cream. Evaporated skim milk or reconstituted non-fat dry milk can be whipped and substituted for whipped cream in certain recipes.  Choose fresh fruits for dessert instead of high-fat foods such as pies or  cakes. Fruits are naturally low in fat. When Dining Out   Order low-fat appetizers such as fruit or vegetable juice, pasta with vegetables or tomato sauce.  Select clear, rather than cream soups.  Ask that dressings and gravies be served on the side. Then use less of them.  Order foods that are baked, broiled, poached, steamed, stir-fried, or roasted.  Ask for margarine instead of butter, and use only a small amount.  Drink sparkling water, unsweetened tea or coffee, or diet soft drinks instead of alcohol or other sweet beverages. QUESTIONS AND ANSWERS ABOUT OTHER FATS IN THE BLOOD: SATURATED FAT, TRANS FAT, AND CHOLESTEROL What is trans fat? Trans fat is a type of fat that is formed when vegetable oil is hardened through a process called hydrogenation. This process helps makes foods more solid, gives them shape, and prolongs their shelf life. Trans fats are also called hydrogenated or partially hydrogenated oils.  What do saturated fat, trans fat, and cholesterol in foods have to do with heart disease? Saturated fat, trans fat, and cholesterol in the diet all raise the level of LDL "bad" cholesterol in the blood. The higher the LDL cholesterol, the greater the risk for coronary heart disease (CHD). Saturated fat and trans fat raise LDL similarly.  What foods contain saturated fat, trans fat, and cholesterol? High amounts of saturated fat are found in animal products, such as fatty cuts of meat, chicken skin, and full-fat dairy products like butter, whole milk, cream, and cheese, and in tropical vegetable oils such as palm, palm kernel, and coconut oil. Trans fat is found in some of the same foods as saturated fat, such as vegetable shortening, some margarines (especially hard or stick margarine), crackers, cookies, baked goods, fried foods, salad dressings, and other processed foods made with partially hydrogenated vegetable oils. Small amounts of trans fat also occur naturally in some animal  products, such as milk products, beef, and lamb. Foods high in cholesterol include liver, other organ meats, egg yolks, shrimp, and full-fat dairy products. How can I use the new food label to make heart-healthy food choices? Check the Nutrition Facts panel of the food label. Choose foods lower in saturated fat, trans fat, and cholesterol. For saturated fat and cholesterol, you can also use the Percent Daily Value (%DV): 5% DV or less is low, and 20% DV or more is high. (There is no %DV for trans fat.) Use the Nutrition Facts panel to choose foods low in saturated fat and cholesterol, and if the trans fat is not listed, read the ingredients and limit products that list shortening or hydrogenated or partially hydrogenated vegetable oil, which tend to be high in trans fat. POINTS TO REMEMBER:   Discuss your risk for heart disease with your caregivers, and take steps to reduce risk factors.  Change your diet. Choose foods that are low in saturated fat, trans fat, and cholesterol.  Add exercise to your daily routine if   it is not already being done. Participate in physical activity of moderate intensity, like brisk walking, for at least 30 minutes on most, and preferably all days of the week. No time? Break the 30 minutes into three, 10-minute segments during the day.  Stop smoking. If you do smoke, contact your caregiver to discuss ways in which they can help you quit.  Do not use street drugs.  Maintain a normal weight.  Maintain a healthy blood pressure.  Keep up with your blood work for checking the fats in your blood as directed by your caregiver. Document Released: 02/19/2004 Document Revised: 11/02/2011 Document Reviewed: 09/16/2008 [employer] Patient Information 2013 [employer].  

## 2012-05-25 NOTE — Progress Notes (Signed)
Subjective:    Patient ID: Zachary Graham, male    DOB: 01/20/1961, 52 y.o.   MRN: [id number]  Diabetes He presents for his follow-up diabetic visit. He has type 1 diabetes mellitus. His disease course has been fluctuating. There are no hypoglycemic associated symptoms. Pertinent negatives for hypoglycemia include no dizziness or tremors. Pertinent negatives for diabetes include no blurred vision, no chest pain, no fatigue, no foot paresthesias, no foot ulcerations, no polydipsia, no polyphagia, no polyuria, no visual change, no weakness and no weight loss. There are no hypoglycemic complications. There are no diabetic complications. Current diabetic treatment includes intensive insulin program. His weight is increasing steadily. He is following a generally unhealthy diet. When asked about meal planning, he reported none. He has not had a previous visit with a dietician. He participates in exercise three times a week. There is no change in his home blood glucose trend. An ACE inhibitor/angiotensin II receptor blocker is being taken. He does not see a podiatrist.Eye exam is current.      Review of Systems  Constitutional: Negative.  Negative for weight loss and fatigue.  HENT: Negative.   Eyes: Negative.  Negative for blurred vision.  Respiratory: Negative.  Negative for shortness of breath, wheezing and stridor.   Cardiovascular: Negative.  Negative for chest pain, palpitations and leg swelling.  Gastrointestinal: Negative.  Negative for nausea, vomiting, abdominal pain and diarrhea.  Genitourinary: Negative.  Negative for polyuria and frequency.  Musculoskeletal: Negative.  Negative for myalgias, back pain, joint swelling, arthralgias and gait problem.  Skin: Negative.   Neurological: Negative for dizziness, tremors, weakness and light-headedness.  Hematological: Negative for polydipsia, polyphagia and adenopathy. Does not bruise/bleed easily.  Psychiatric/Behavioral: Negative.          Objective:   Physical Exam  Vitals reviewed. Constitutional: He is oriented to person, place, and time. He appears well-developed and well-nourished. No distress.  HENT:  Head: Normocephalic and atraumatic.  Mouth/Throat: Oropharynx is clear and moist. No oropharyngeal exudate.  Eyes: Conjunctivae normal are normal. Right eye exhibits no discharge. Left eye exhibits no discharge. No scleral icterus.  Neck: Normal range of motion. Neck supple. No JVD present. No tracheal deviation present. No thyromegaly present.  Cardiovascular: Normal rate, regular rhythm, S1 normal, S2 normal and intact distal pulses.  Exam reveals no gallop, no S3, no S4 and no friction rub.   Murmur heard.  Decrescendo systolic murmur is present with a grade of 1/6   No diastolic murmur is present  Pulses:      Carotid pulses are 1+ on the right side, and 1+ on the left side.      Radial pulses are 1+ on the right side, and 1+ on the left side.       Femoral pulses are 1+ on the right side, and 1+ on the left side.      Popliteal pulses are 1+ on the right side, and 1+ on the left side.       Dorsalis pedis pulses are 1+ on the right side, and 1+ on the left side.       Posterior tibial pulses are 1+ on the right side, and 1+ on the left side.  Pulmonary/Chest: Effort normal and breath sounds normal. No stridor. No respiratory distress. He has no wheezes. He has no rales. He exhibits no tenderness.  Abdominal: Soft. Bowel sounds are normal. He exhibits no distension and no mass. There is no tenderness. There is no rebound and no guarding.  Musculoskeletal: Normal range of motion. He exhibits no edema and no tenderness.  Lymphadenopathy:    He has no cervical adenopathy.  Neurological: He is oriented to person, place, and time.  Skin: Skin is warm and dry. No rash noted. He is not diaphoretic. No erythema. No pallor.  Psychiatric: He has a normal mood and affect. His behavior is normal. Judgment and thought content  normal.     Lab Results  Component Value Date   WBC 7.5 09/21/2011   HGB 14.0 09/21/2011   HCT 42.0 09/21/2011   PLT 231.0 09/21/2011   GLUCOSE 69* 01/21/2012   CHOL 162 09/21/2011   TRIG 335.0* 09/21/2011   HDL 37.30* 09/21/2011   LDLDIRECT 86.1 09/21/2011   ALT 32 09/21/2011   AST 20 09/21/2011   NA 139 01/21/2012   K 4.6 01/21/2012   CL 106 01/21/2012   CREATININE 0.8 01/21/2012   BUN 10 01/21/2012   CO2 26 01/21/2012   TSH 4.75 09/21/2011   PSA 0.31 09/21/2011   HGBA1C 6.8* 01/21/2012   MICROALBUR 0.9 09/21/2011       Assessment & Plan:

## 2012-05-26 ENCOUNTER — Other Ambulatory Visit (INDEPENDENT_AMBULATORY_CARE_PROVIDER_SITE_OTHER): Payer: 59

## 2012-05-26 DIAGNOSIS — E119 Type 2 diabetes mellitus without complications: Secondary | ICD-10-CM

## 2012-05-26 DIAGNOSIS — E781 Pure hyperglyceridemia: Secondary | ICD-10-CM

## 2012-05-26 DIAGNOSIS — E7800 Pure hypercholesterolemia, unspecified: Secondary | ICD-10-CM

## 2012-05-26 DIAGNOSIS — E139 Other specified diabetes mellitus without complications: Secondary | ICD-10-CM

## 2012-05-26 LAB — LIPID PANEL
HDL: 43.4 mg/dL (ref >39.00)
Triglycerides: 178 mg/dL — ABNORMAL HIGH (ref 0.0–149.0)

## 2012-05-26 LAB — COMPREHENSIVE METABOLIC PANEL
Albumin: 3.6 g/dL (ref 3.5–5.2)
BUN: 13 mg/dL (ref 6–23)
CO2: 28 mEq/L (ref 19–32)
GFR: 104.6 mL/min (ref >60.00)
Glucose, Bld: 169 mg/dL — ABNORMAL HIGH (ref 70–99)
Potassium: 4.7 mEq/L (ref 3.5–5.1)
Sodium: 139 mEq/L (ref 135–145)
Total Bilirubin: 0.7 mg/dL (ref 0.3–1.2)
Total Protein: 6.6 g/dL (ref 6.0–8.3)

## 2012-05-26 LAB — HEMOGLOBIN A1C: Hgb A1c MFr Bld: 7.4 % — ABNORMAL HIGH (ref 4.6–6.5)

## 2012-05-26 LAB — LDL CHOLESTEROL, DIRECT: Direct LDL: 132.1 mg/dL

## 2012-05-26 NOTE — Assessment & Plan Note (Signed)
He is due for an A1C Will also monitor his renal function

## 2012-05-26 NOTE — Assessment & Plan Note (Signed)
He believes he has had a murmur since childhood He does not have any s/s His EKG shows no pathology I have asked him to have an ECHO to see if there is valvular pathology

## 2012-05-26 NOTE — Assessment & Plan Note (Signed)
I will recheck his FLP IF his trigs remain elevated then will ask him to start a med for this

## 2012-05-26 NOTE — Assessment & Plan Note (Signed)
He is doing well on zocor 

## 2012-05-30 ENCOUNTER — Other Ambulatory Visit (HOSPITAL_COMMUNITY): Payer: 59

## 2012-06-05 ENCOUNTER — Encounter: Payer: Self-pay | Admitting: Internal Medicine

## 2012-06-06 ENCOUNTER — Other Ambulatory Visit: Payer: Self-pay | Admitting: Internal Medicine

## 2012-06-06 DIAGNOSIS — R011 Cardiac murmur, unspecified: Secondary | ICD-10-CM

## 2012-06-13 ENCOUNTER — Ambulatory Visit (HOSPITAL_COMMUNITY): Payer: 59 | Attending: Internal Medicine

## 2012-06-13 DIAGNOSIS — E119 Type 2 diabetes mellitus without complications: Secondary | ICD-10-CM | POA: Insufficient documentation

## 2012-06-13 DIAGNOSIS — E785 Hyperlipidemia, unspecified: Secondary | ICD-10-CM | POA: Insufficient documentation

## 2012-06-13 DIAGNOSIS — R011 Cardiac murmur, unspecified: Secondary | ICD-10-CM | POA: Insufficient documentation

## 2012-06-13 NOTE — Progress Notes (Signed)
Echocardiogram performed.  

## 2012-06-14 NOTE — Addendum Note (Signed)
Addended by: [name] on: 06/14/2012 09:22 AM   Modules accepted: Orders

## 2012-06-14 NOTE — Progress Notes (Signed)
  Subjective:    Patient ID: Zachary Graham, male    DOB: [birth year], 52 y.o.   MRN: [id number]  HPI    Review of Systems     Objective:   Physical Exam        Assessment & Plan:

## 2012-06-20 ENCOUNTER — Observation Stay (HOSPITAL_COMMUNITY)
Admission: EM | Admit: 2012-06-20 | Discharge: 2012-06-22 | Disposition: A | Discharge status: Home / Self Care | Payer: 59 | Attending: Internal Medicine | Admitting: Internal Medicine

## 2012-06-20 ENCOUNTER — Encounter (HOSPITAL_COMMUNITY): Payer: Self-pay | Admitting: Emergency Medicine

## 2012-06-20 DIAGNOSIS — R197 Diarrhea, unspecified: Secondary | ICD-10-CM

## 2012-06-20 DIAGNOSIS — E11649 Type 2 diabetes mellitus with hypoglycemia without coma: Secondary | ICD-10-CM | POA: Diagnosis present

## 2012-06-20 DIAGNOSIS — G4733 Obstructive sleep apnea (adult) (pediatric): Secondary | ICD-10-CM | POA: Insufficient documentation

## 2012-06-20 DIAGNOSIS — E1069 Type 1 diabetes mellitus with other specified complication: Principal | ICD-10-CM | POA: Insufficient documentation

## 2012-06-20 DIAGNOSIS — N400 Enlarged prostate without lower urinary tract symptoms: Secondary | ICD-10-CM

## 2012-06-20 DIAGNOSIS — E781 Pure hyperglyceridemia: Secondary | ICD-10-CM | POA: Insufficient documentation

## 2012-06-20 DIAGNOSIS — E1169 Type 2 diabetes mellitus with other specified complication: Secondary | ICD-10-CM

## 2012-06-20 DIAGNOSIS — Z794 Long term (current) use of insulin: Secondary | ICD-10-CM | POA: Insufficient documentation

## 2012-06-20 DIAGNOSIS — D72829 Elevated white blood cell count, unspecified: Secondary | ICD-10-CM | POA: Insufficient documentation

## 2012-06-20 DIAGNOSIS — Z0000 Encounter for general adult medical examination without abnormal findings: Secondary | ICD-10-CM

## 2012-06-20 DIAGNOSIS — E785 Hyperlipidemia, unspecified: Secondary | ICD-10-CM | POA: Diagnosis present

## 2012-06-20 DIAGNOSIS — E162 Hypoglycemia, unspecified: Secondary | ICD-10-CM

## 2012-06-20 DIAGNOSIS — Z79899 Other long term (current) drug therapy: Secondary | ICD-10-CM | POA: Insufficient documentation

## 2012-06-20 DIAGNOSIS — I370 Nonrheumatic pulmonary valve stenosis: Secondary | ICD-10-CM

## 2012-06-20 DIAGNOSIS — E7800 Pure hypercholesterolemia, unspecified: Secondary | ICD-10-CM | POA: Insufficient documentation

## 2012-06-20 DIAGNOSIS — E139 Other specified diabetes mellitus without complications: Secondary | ICD-10-CM

## 2012-06-20 LAB — CBC WITH DIFFERENTIAL/PLATELET
Basophils Absolute: 0 10*3/uL (ref 0.0–0.1)
Basophils Relative: 0 % (ref 0–1)
Eosinophils Absolute: 0.2 10*3/uL (ref 0.0–0.7)
MCH: 27.4 pg (ref 26.0–34.0)
MCHC: 33.4 g/dL (ref 30.0–36.0)
Neutro Abs: 13.8 10*3/uL — ABNORMAL HIGH (ref 1.7–7.7)
Neutrophils Relative %: 90 % — ABNORMAL HIGH (ref 43–77)
Platelets: 219 10*3/uL (ref 150–400)

## 2012-06-20 LAB — COMPREHENSIVE METABOLIC PANEL
ALT: 21 U/L (ref 0–53)
AST: 19 U/L (ref 0–37)
Albumin: 3.7 g/dL (ref 3.5–5.2)
Calcium: 9.1 mg/dL (ref 8.4–10.5)
GFR calc Af Amer: >90 mL/min (ref >90)
Glucose, Bld: 80 mg/dL (ref 70–99)
Sodium: 139 mEq/L (ref 135–145)
Total Protein: 6.8 g/dL (ref 6.0–8.3)

## 2012-06-20 LAB — GLUCOSE, CAPILLARY: Glucose-Capillary: 84 mg/dL (ref 70–99)

## 2012-06-20 MED ORDER — SIMVASTATIN 40 MG PO TABS
40.0000 mg | ORAL_TABLET | Freq: Every day | ORAL | Status: DC
  Administered 2012-06-21: 40 mg via ORAL
  Filled 2012-06-20 (×2): qty 1

## 2012-06-20 MED ORDER — DEXTROSE-NACL 5-0.9 % IV SOLN
Freq: Once | INTRAVENOUS | Status: AC
  Administered 2012-06-20: 125 mL/h via INTRAVENOUS

## 2012-06-20 MED ORDER — INSULIN ASPART 100 UNIT/ML ~~LOC~~ SOLN: 0.0000 [IU] | Freq: Every day | SUBCUTANEOUS | Status: DC

## 2012-06-20 MED ORDER — INSULIN ASPART 100 UNIT/ML ~~LOC~~ SOLN
0.0000 [IU] | Freq: Three times a day (TID) | SUBCUTANEOUS | Status: DC
  Administered 2012-06-21: 9 [IU] via SUBCUTANEOUS

## 2012-06-20 MED ORDER — DEXTROSE 50 % IV SOLN
25.0000 mL | Freq: Once | INTRAVENOUS | Status: AC
  Administered 2012-06-20: 25 mL via INTRAVENOUS
  Filled 2012-06-20: qty 50

## 2012-06-20 MED ORDER — INSULIN ASPART 100 UNIT/ML ~~LOC~~ SOLN: 3.0000 [IU] | Freq: Three times a day (TID) | SUBCUTANEOUS | Status: DC

## 2012-06-20 MED ORDER — ONDANSETRON HCL 4 MG/2ML IJ SOLN
INTRAMUSCULAR | Status: AC
  Administered 2012-06-20: 4 mg
  Filled 2012-06-20: qty 2

## 2012-06-20 MED ORDER — SODIUM CHLORIDE 0.9 % IV SOLN
INTRAVENOUS | Status: DC
  Administered 2012-06-20 – 2012-06-22 (×4): via INTRAVENOUS

## 2012-06-20 MED ORDER — ONDANSETRON HCL 4 MG/2ML IJ SOLN: 4.0000 mg | Freq: Once | INTRAMUSCULAR | Status: DC

## 2012-06-20 MED ORDER — PANTOPRAZOLE SODIUM 40 MG PO TBEC
40.0000 mg | DELAYED_RELEASE_TABLET | Freq: Every day | ORAL | Status: DC
Start: 2012-06-21 — End: 2012-06-22
  Administered 2012-06-21 – 2012-06-22 (×2): 40 mg via ORAL
  Filled 2012-06-20 (×2): qty 1

## 2012-06-20 NOTE — ED Notes (Signed)
Pt c/o fluctuating blood sugar. Pt reports feeling flushed, faint, queasy, nauseated, lethargic for 3 hours. Last intake at 2015. Pt has been taking sugar pills, carbs all throughout the day. Pt reports having 46 cbg at 1830. AAOx4.

## 2012-06-20 NOTE — H&P (Signed)
[facility] History and Physical  Renard Caperton ZOX:[id number] DOB: [birth year]    PCP:   Sanda Linger, MD   Chief Complaint: persistent symptomatic hypoglycemia.  HPI: Zachary Graham is an 52 y.o. male with hx of type I DM for many years, tightly control on insulin pump (basal 1.7 humalog x24 hours, ISR 1 per 10 above 100) with HgA1C 6.9, presents to the ER with symptomatic hypoglycemia.  He found his BS was in the 40's.  He took glucose tablets and sugar bars without significant improvements.  He stopped his pump for 2 hours and eventually his BS went up to 80.  No loss of consciousness or confusion.  He also exercises regularly.  I reviewed his record, and he had episode of hypoglycemia before, where his basal insulin infusion via pump had been 100 units per day.  He was previously on Metformin, but since he loss over 30lbs, his metformin was discontinued, and his 100 units daily requirement has been reduced to 1.7 units per hour. In the ER, his BS went down again to 60 without significant symptoms.  He was given an amp of D50 and he was started on a drip with D5.  Hospitalist was asked to admit him for obs with respect to his hypoglycemia.  He has started no new medication and had no change in his insulin pump.  He has some adominal bloating and nausea, but this has resolved.  He doesn't have any diarrhea.    Rewiew of Systems:  Constitutional: Negative for malaise, fever and chills. No significant weight loss or weight gain Eyes: Negative for eye pain, redness and discharge, diplopia, visual changes, or flashes of light. ENMT: Negative for ear pain, hoarseness, nasal congestion, sinus pressure and sore throat. No headaches; tinnitus, drooling, or problem swallowing. Cardiovascular: Negative for chest pain, palpitations, diaphoresis, dyspnea and peripheral edema. ; No orthopnea, PND Respiratory: Negative for cough, hemoptysis, wheezing and stridor. No pleuritic chestpain. Gastrointestinal:  Negative for nausea, vomiting, diarrhea, constipation, abdominal pain, melena, blood in stool, hematemesis, jaundice and rectal bleeding.    Genitourinary: Negative for frequency, dysuria, incontinence,flank pain and hematuria; Musculoskeletal: Negative for back pain and neck pain. Negative for swelling and trauma.;  Skin: . Negative for pruritus, rash, abrasions, bruising and skin lesion.; ulcerations Neuro: Negative for headache, lightheadedness and neck stiffness. Negative for weakness, altered level of consciousness , altered mental status, extremity weakness, burning feet, involuntary movement, seizure and syncope.  Psych: negative for anxiety, depression, insomnia, tearfulness, panic attacks, hallucinations, paranoia, suicidal or homicidal ideation    Past Medical History  Diagnosis Date  . Diabetes mellitus   . GERD (gastroesophageal reflux disease)   . Hypertension   . Hyperlipidemia   . OSA (obstructive sleep apnea)     Past Surgical History  Procedure Date  . No past surgeries     Medications:  HOME MEDS: Prior to Admission medications   Medication Sig Start Date End Date Taking? Authorizing Provider  glucose blood (ONE TOUCH ULTRA TEST) test strip 6/day, and lancets 250.03 10/01/11  Yes Romero Belling, MD  insulin aspart (NOVOLOG) 100 UNIT/ML injection For use via pump, total 300 units per day 10/07/11  Yes Romero Belling, MD  Insulin Infusion Pump DEVI by Does not apply route. novolog   Yes Historical Provider, MD  Insulin Infusion Pump Supplies (MINIMED INFUSION SET-MMT 397) MISC 1 Device by Does not apply route daily.   Yes Historical Provider, MD  Insulin Infusion Pump Supplies (PARADIGM RESERVOIR ) MISC 1 Device  by Does not apply route daily.   Yes Historical Provider, MD  losartan (COZAAR) 50 MG tablet Take 50 mg by mouth daily.   Yes Historical Provider, MD  pantoprazole (PROTONIX) 40 MG tablet Take 1 tablet (40 mg total) by mouth daily. 04/04/12  Yes [name],  MD  simvastatin (ZOCOR) 40 MG tablet Take 40 mg by mouth daily.   Yes Historical Provider, MD     Allergies:  Allergies  Allergen Reactions  . Lisinopril   . Penicillins   . Shellfish Allergy     Social History:   reports that he has never smoked. He has never used smokeless tobacco. He reports that he does not drink alcohol or use illicit drugs.  Family History: Family History  Problem Relation Age of Onset  . Colon cancer Maternal Grandfather   . Colon cancer Paternal Grandfather   . Alcohol abuse Neg Hx   . Asthma Neg Hx   . Diabetes Neg Hx   . Heart disease Father   . Hyperlipidemia Neg Hx   . Hypertension Neg Hx   . Kidney disease Neg Hx   . Stroke Neg Hx      Physical Exam: There were no vitals filed for this visit. There were no vitals taken for this visit.  GEN:  Pleasant patient lying in the stretcher in no acute distress; cooperative with exam. PSYCH:  alert and oriented x4; does not appear anxious or depressed; affect is appropriate. HEENT: Mucous membranes pink and anicteric; PERRLA; EOM intact; no cervical lymphadenopathy nor thyromegaly or carotid bruit; no JVD; There were no stridor. Neck is very supple. Breasts:: Not examined CHEST WALL: No tenderness CHEST: Normal respiration, clear to auscultation bilaterally.  HEART: Regular rate and rhythm.  There are no murmur, rub, or gallops.   BACK: No kyphosis or scoliosis; no CVA tenderness ABDOMEN: soft and non-tender; no masses, no organomegaly, normal abdominal bowel sounds; no pannus; no intertriginous candida. There is no rebound and no distention. Rectal Exam: Not done EXTREMITIES: No bone or joint deformity; age-appropriate arthropathy of the hands and knees; no edema; no ulcerations.  There is no calf tenderness. Genitalia: not examined PULSES: 2+ and symmetric SKIN: Normal hydration no rash or ulceration CNS: Cranial nerves 2-12 grossly intact no focal lateralizing neurologic deficit.  Speech is  fluent; uvula elevated with phonation, facial symmetry and tongue midline. DTR are normal bilaterally, cerebella exam is intact, barbinski is negative and strengths are equaled bilaterally.  No sensory loss.   Labs on Admission:  Basic Metabolic Panel:  Lab 06/20/12 4098  NA 139  K 3.5  CL 102  CO2 22  GLUCOSE 80  BUN 12  CREATININE 0.66  CALCIUM 9.1  MG --  PHOS --   Liver Function Tests:  Lab 06/20/12 2210  AST 19  ALT 21  ALKPHOS 85  BILITOT 0.4  PROT 6.8  ALBUMIN 3.7   No results found for this basename: LIPASE:5,AMYLASE:5 in the last 168 hours No results found for this basename: AMMONIA:5 in the last 168 hours CBC:  Lab 06/20/12 2210  WBC 15.4*  NEUTROABS 13.8*  HGB 14.4  HCT 43.1  MCV 81.9  PLT 219   Cardiac Enzymes: No results found for this basename: CKTOTAL:5,CKMB:5,CKMBINDEX:5,TROPONINI:5 in the last 168 hours  CBG:  Lab 06/20/12 2309 06/20/12 2142  GLUCAP 60* 84     Radiological Exams on Admission: No results found.   Assessment/Plan Present on Admission:  . Hypoglycemia associated with diabetes . Pure  hypercholesterolemia . Diabetes 1.5, managed as type 1 . Pure hyperglyceridemia . OSA (obstructive sleep apnea)  PLAN:  I don't know exactly what precipitated his hypoglycemia today, but I suspect his insulin need has dropped with his exercise and weight loss.  For now, I will discontinue his insulin pump, and use sensitive meal time and AC and hs coverage with sensitive humalog.  He will be continued on his carb modified diet and will d/c his Dextrose IVF.  I suspect his BS will rise and he will need resuming of his basal rate.  His home meds will be continued.  He is stable, full code, and will be admitted to [facility] under OBS status.  I have continue his home meds.  Thank you for an opportunity to participate in the care of your nice patient.  Other plans as per orders.  Code Status: FULL Unk Lightning, MD. [facility] Pager  [phone] 7pm to 7am.  06/20/2012, 11:58 PM

## 2012-06-20 NOTE — ED Notes (Addendum)
Hospitalist at bedside 

## 2012-06-20 NOTE — ED Notes (Signed)
Patient vomited large amount of pinkish brown emesis.

## 2012-06-20 NOTE — ED Notes (Signed)
CBG 84. 

## 2012-06-20 NOTE — ED Provider Notes (Signed)
History     CSN: [id number]  Arrival date & time 06/20/12  2137   First MD Initiated Contact with Patient 06/20/12 2143      Chief Complaint  Patient presents with  . Hypoglycemia    (Consider location/radiation/quality/duration/timing/severity/associated sxs/prior treatment) HPI Comments: Patient presents to the ER for evaluation of low blood sugar. Patient is an insulin-dependent diabetic with an insulin pump. He reports that for the last 6 hours as blood sugar has been running low. He is intermittently he come diaphoretic, flushed, weak and dizzy. He rechecked his blood sugar and it was low. As at its lowest at 6:30 PM, 46. He has been drinking Sprite, eating sugar and starches but his blood sugar only partially responds and then drops again. This has never happened before. He stopped his pump approximately one hour ago. Patient sees Dr. Everardo All at [facility].  Patient reports that he feels bloated and nauseated after drinking the site and eating multiple sugar snacks. No abdominal pain, however.   Past Medical History  Diagnosis Date  . Diabetes mellitus   . GERD (gastroesophageal reflux disease)   . Hypertension   . Hyperlipidemia   . OSA (obstructive sleep apnea)     Past Surgical History  Procedure Date  . No past surgeries     Family History  Problem Relation Age of Onset  . Colon cancer Maternal Grandfather   . Colon cancer Paternal Grandfather   . Alcohol abuse Neg Hx   . Asthma Neg Hx   . Diabetes Neg Hx   . Heart disease Father   . Hyperlipidemia Neg Hx   . Hypertension Neg Hx   . Kidney disease Neg Hx   . Stroke Neg Hx     History  Substance Use Topics  . Smoking status: Never Smoker   . Smokeless tobacco: Never Used  . Alcohol Use: No      Review of Systems  Constitutional: Positive for diaphoresis and fatigue.  Gastrointestinal: Positive for nausea.  Neurological: Positive for dizziness.    Allergies  Lisinopril; Penicillins;  and Shellfish allergy  Home Medications   Current Outpatient Rx  Name  Route  Sig  Dispense  Refill  . GLUCOSE BLOOD VI STRP      6/day, and lancets 250.03   600 each   12   . INSULIN ASPART 100 UNIT/ML [place] SOLN      For use via pump, total 300 units per day   30 vial   3   . INSULIN INFUSION PUMP DEVI   Does not apply   by Does not apply route. novolog         . PARADIGM QUICK-SET 23" MISC   Does not apply   1 Device by Does not apply route daily.         Marland Kitchen PARADIGM PUMP RESERVOIR MISC   Does not apply   1 Device by Does not apply route daily.         Marland Kitchen LOSARTAN POTASSIUM 50 MG PO TABS   Oral   Take 50 mg by mouth daily.         Marland Kitchen PANTOPRAZOLE SODIUM 40 MG PO TBEC   Oral   Take 1 tablet (40 mg total) by mouth daily.   30 tablet   5   . SIMVASTATIN 40 MG PO TABS   Oral   Take 40 mg by mouth daily.           There were  no vitals taken for this visit.  Physical Exam  Constitutional: He is oriented to person, place, and time. He appears well-developed and well-nourished. No distress.  HENT:  Head: Normocephalic and atraumatic.  Right Ear: Hearing normal.  Nose: Nose normal.  Mouth/Throat: Oropharynx is clear and moist and mucous membranes are normal.  Eyes: Conjunctivae normal and EOM are normal. Pupils are equal, round, and reactive to light.  Neck: Normal range of motion. Neck supple.  Cardiovascular: Normal rate, regular rhythm, S1 normal and S2 normal.  Exam reveals no gallop and no friction rub.   No murmur heard. Pulmonary/Chest: Effort normal and breath sounds normal. No respiratory distress. He exhibits no tenderness.  Abdominal: Soft. Normal appearance and bowel sounds are normal. There is no hepatosplenomegaly. There is no tenderness. There is no rebound, no guarding, no tenderness at McBurney's point and negative Murphy's sign. No hernia.  Musculoskeletal: Normal range of motion.  Neurological: He is alert and oriented to person,  place, and time. He has normal strength. No cranial nerve deficit or sensory deficit. Coordination normal. GCS eye subscore is 4. GCS verbal subscore is 5. GCS motor subscore is 6.  Skin: Skin is warm, dry and intact. No rash noted. No cyanosis.  Psychiatric: He has a normal mood and affect. His speech is normal and behavior is normal. Thought content normal.    ED Course  Procedures (including critical care time)  Labs Reviewed  CBC WITH DIFFERENTIAL - Abnormal; Notable for the following:    WBC 15.4 (*)     Neutrophils Relative 90 (*)     Neutro Abs 13.8 (*)     Lymphocytes Relative 2 (*)     Lymphs Abs 0.4 (*)     All other components within normal limits  GLUCOSE, CAPILLARY - Abnormal; Notable for the following:    Glucose-Capillary 60 (*)     All other components within normal limits  GLUCOSE, CAPILLARY  COMPREHENSIVE METABOLIC PANEL  URINALYSIS, ROUTINE W REFLEX MICROSCOPIC   No results found.   Diagnosis: Refractory hypoglycemia    MDM  Patient is an insulin-dependent diabetic with an insulin pump. He has been experiencing recurrent hypoglycemia for the last several hours. He took his pump off an hour before coming to the ER, had been living himself with oral glucose prior to being seen. Blood sugar was 84 at arrival, but continued to drop. The ER. Blood sugar was 60 at last check, 3 hours after turning his insulin pump off. Patient was administered half amp of D50 and started on a D5 drip. Hospitalist was consulted to observe the patient for glucose control.        Gilda Crease, MD 06/20/12 [phone]

## 2012-06-20 NOTE — ED Notes (Signed)
Pt aware of urinalysis sample needed. Unable to provide it at the moment.

## 2012-06-20 NOTE — ED Notes (Signed)
Patient is unable to use restroom at this time.  

## 2012-06-21 ENCOUNTER — Encounter (HOSPITAL_COMMUNITY): Payer: Self-pay

## 2012-06-21 DIAGNOSIS — E1169 Type 2 diabetes mellitus with other specified complication: Secondary | ICD-10-CM

## 2012-06-21 DIAGNOSIS — G4733 Obstructive sleep apnea (adult) (pediatric): Secondary | ICD-10-CM

## 2012-06-21 DIAGNOSIS — D72829 Elevated white blood cell count, unspecified: Secondary | ICD-10-CM

## 2012-06-21 DIAGNOSIS — R197 Diarrhea, unspecified: Secondary | ICD-10-CM

## 2012-06-21 LAB — URINALYSIS, ROUTINE W REFLEX MICROSCOPIC
Glucose, UA: >1000 mg/dL — AB
Leukocytes, UA: NEGATIVE
Leukocytes, UA: NEGATIVE
Nitrite: NEGATIVE
Nitrite: NEGATIVE
Protein, ur: NEGATIVE mg/dL
Protein, ur: NEGATIVE mg/dL
Urobilinogen, UA: 0.2 mg/dL (ref 0.0–1.0)
Urobilinogen, UA: 0.2 mg/dL (ref 0.0–1.0)

## 2012-06-21 LAB — GLUCOSE, CAPILLARY
Glucose-Capillary: 450 mg/dL — ABNORMAL HIGH (ref 70–99)
Glucose-Capillary: 404 mg/dL — ABNORMAL HIGH (ref 70–99)
Glucose-Capillary: 367 mg/dL — ABNORMAL HIGH (ref 70–99)
Glucose-Capillary: 307 mg/dL — ABNORMAL HIGH (ref 70–99)
Glucose-Capillary: 227 mg/dL — ABNORMAL HIGH (ref 70–99)
Glucose-Capillary: 219 mg/dL — ABNORMAL HIGH (ref 70–99)
Glucose-Capillary: 155 mg/dL — ABNORMAL HIGH (ref 70–99)
Glucose-Capillary: 79 mg/dL (ref 70–99)

## 2012-06-21 LAB — URINE MICROSCOPIC-ADD ON

## 2012-06-21 LAB — CBC
MCHC: 33.8 g/dL (ref 30.0–36.0)
Platelets: 194 10*3/uL (ref 150–400)
RDW: 14.5 % (ref 11.5–15.5)
WBC: 9.5 10*3/uL (ref 4.0–10.5)

## 2012-06-21 MED ORDER — IBUPROFEN 600 MG PO TABS
600.0000 mg | ORAL_TABLET | Freq: Three times a day (TID) | ORAL | Status: DC | PRN
  Administered 2012-06-21 – 2012-06-22 (×2): 600 mg via ORAL
  Filled 2012-06-21: qty 1

## 2012-06-21 MED ORDER — LOSARTAN POTASSIUM 50 MG PO TABS
50.0000 mg | ORAL_TABLET | Freq: Every day | ORAL | Status: DC
  Administered 2012-06-21 – 2012-06-22 (×2): 50 mg via ORAL
  Filled 2012-06-21 (×2): qty 1

## 2012-06-21 MED ORDER — LOPERAMIDE HCL 2 MG PO CAPS
4.0000 mg | ORAL_CAPSULE | ORAL | Status: DC | PRN
  Administered 2012-06-21: 4 mg via ORAL
  Filled 2012-06-21 (×2): qty 2

## 2012-06-21 MED ORDER — ONDANSETRON HCL 4 MG/2ML IJ SOLN
4.0000 mg | Freq: Four times a day (QID) | INTRAMUSCULAR | Status: DC | PRN
  Administered 2012-06-21: 4 mg via INTRAVENOUS
  Filled 2012-06-21: qty 2

## 2012-06-21 MED ORDER — INSULIN PUMP
SUBCUTANEOUS | Status: DC
  Administered 2012-06-21: 16:00:00 via SUBCUTANEOUS
  Administered 2012-06-21: 6.4 via SUBCUTANEOUS
  Administered 2012-06-21: 12:00:00 via SUBCUTANEOUS
  Administered 2012-06-22: 5.425 via SUBCUTANEOUS
  Filled 2012-06-21: qty 1

## 2012-06-21 MED ORDER — INSULIN ASPART 100 UNIT/ML ~~LOC~~ SOLN
8.0000 [IU] | Freq: Once | SUBCUTANEOUS | Status: AC
  Administered 2012-06-21: 8 [IU] via SUBCUTANEOUS

## 2012-06-21 NOTE — Progress Notes (Signed)
Pt refused CPAP

## 2012-06-21 NOTE — Progress Notes (Signed)
Patient blood sugar noted at 450 paged on call regarding blood sugar of 450 Fate Galanti RN

## 2012-06-21 NOTE — Progress Notes (Signed)
Inpatient Diabetes Program Recommendations  AACE/ADA: New Consensus Statement on Inpatient Glycemic Control (2013)  Target Ranges:  Prepandial:   less than 140 mg/dL      Peak postprandial:   less than 180 mg/dL (1-2 hours)      Critically ill patients:  140 - 180 mg/dL   Reason for Visit: Hypoglycemia and Hyperglycemia  52 yo male is an insulin-dependent diabetic with an insulin pump. He reports last night that for the last 6 hours as blood sugar was  running low. He is intermittently he come diaphoretic, flushed, weak and dizzy. He rechecked his blood sugar and it was still low. As at its lowest at 6:30 PM, 46. He had been drinking Sprite, eating sugar and starches but his blood sugar only partially responds and then drops again. This has never happened before.  Pump was discontinued at approx 10:00 pm.  States he ate a chicken sandwich from [employer] and thinks it may have been bad.  Checks blood sugars at least 4 times/day and sees Dr. Everardo All for diabetes management on regular basis.  Results for TANOR, GLASPY (MRN [id number]) as of 06/21/2012 09:59  Ref. Range 06/20/2012 22:10  Sodium Latest Range: 135-145 mEq/L 139  Potassium Latest Range: 3.5-5.1 mEq/L 3.5  Chloride Latest Range: 96-112 mEq/L 102  CO2 Latest Range: 19-32 mEq/L 22  BUN Latest Range: 6-23 mg/dL 12  Creatinine Latest Range: 0.50-1.35 mg/dL 4.09  Calcium Latest Range: 8.4-10.5 mg/dL 9.1  GFR calc non Af Amer Latest Range: >90 mL/min >90  GFR calc Af Amer Latest Range: >90 mL/min >90  Glucose Latest Range: 70-99 mg/dL 80  Alkaline Phosphatase Latest Range: 39-117 U/L 85  Albumin Latest Range: 3.5-5.2 g/dL 3.7  AST Latest Range: 0-37 U/L 19  ALT Latest Range: 0-53 U/L 21  Total Protein Latest Range: 6.0-8.3 g/dL 6.8  Total Bilirubin Latest Range: 0.3-1.2 mg/dL 0.4   Results for EMIR, NACK (MRN [id number]) as of 06/21/2012 09:59  Ref. Range 05/26/2012 10:15  Hemoglobin A1C Latest Range: 4.6-6.5 % 7.4 (H)     Pt continues  to be nauseated and has had several bouts of diarrhea in past couple hours.  Has breakfast tray but does not feel like eating.   Need BMET and basal insulin now.  Pt is in agreement to restart insulin pump at previous basal rate.  Discussed with RN and will text-page MD.  Recommendations:  Restart insulin pump at rate of 1.6 units/hour.  If pump not restarted immediately, will need basal insulin or insulin drip to prevent DKA. Pt to f/u with Dr. Everardo All at discharge for any changes with insulin pump settings. Will continue to follow.  Thank you. Ailene Ards, RD, LDN, CDE Inpatient Diabetes Coordinator [phone]

## 2012-06-21 NOTE — Care Management Note (Signed)
    Page 1 of 1   06/21/2012     12:09:39 PM   CARE MANAGEMENT NOTE 06/21/2012  Patient:  Zachary Graham,Zachary Graham   Account Number:  [id number]  Date Initiated:  06/21/2012  Documentation initiated by:  Lorenda Ishihara  Subjective/Objective Assessment:   52 yo male admitted with symptomatic hypoglycemia. PTA lived at home with spouse.     Action/Plan:   Home when stable   Anticipated DC Date:  06/22/2012   Anticipated DC Plan:  HOME/SELF CARE      DC Planning Services  CM consult      Choice offered to / List presented to:             Status of service:  Completed, signed off Medicare Important Message given?   (If response is "NO", the following Medicare IM given date fields will be blank) Date Medicare IM given:   Date Additional Medicare IM given:    Discharge Disposition:  HOME/SELF CARE  Per UR Regulation:  Reviewed for med. necessity/level of care/duration of stay  If discussed at Long Length of Stay Meetings, dates discussed:    Comments:

## 2012-06-21 NOTE — Progress Notes (Signed)
[facility] PROGRESS NOTE  Zachary Graham WJX:[id number] DOB: 20-Nov-1960 DOA: 06/20/2012 PCP: Sanda Linger, MD  Assessment/Plan: Present on Admission:  . Hypoglycemia associated with diabetes -Now resolved, Possibly secondary to underlying infection -Will resume insulin pump, please see below . Diarrhea/probable gastroenteritis -Obtain stools studies, he is afebrile and hemodynamically stable at this time so we'll hold off antibiotics pending studies -Continue IV fluids/supportive care . Leukocytosis  -Likely secondary to #2, urinalysis negative and patient has no pulmonary symptoms -Await stool studies, follow recheck  . OSA (obstructive sleep apnea) . Insulin-dependent diabetes mellitus -Blood glucose now uncontrolled or following resolution of hypoglycemia -Will resume insulin pump, monitor Accu-Cheks and further treat accordingly.   Code Status: Full code Family Communication: Directly with patient at bedside Disposition Plan: To home when medically ready   Consultants:  none  Procedures:  none  Antibiotics:  none  HPI/Subjective: States he had watery diarrhea x8 overnight, and also reports he vomited in ED. He reports he had eaten a chicken sandwich with suspicious mayo from [employer] prior to onset of his symptoms.  Objective: Filed Vitals:   06/21/12 0058 06/21/12 0150 06/21/12 0151 06/21/12 0505  BP:   141/85 138/73  Pulse:   91 112  Temp: 98.1 F (36.7 C)  98.5 F (36.9 C) 100.1 F (37.8 C)  TempSrc: Oral  Oral Oral  Resp:   20 20  Height:  5\' 9"  (1.753 m)    Weight:  102.331 kg (225 lb 9.6 oz)    SpO2:   100% 93%    Intake/Output Summary (Last 24 hours) at 06/21/12 1111 Last data filed at 06/21/12 1039  Gross per 24 hour  Intake 1174.58 ml  Output    800 ml  Net 374.58 ml   Filed Weights   06/21/12 0150  Weight: 102.331 kg (225 lb 9.6 oz)    Exam:   General:  Middle aged obese male in no apparent distress  Cardiovascular: Regular  rate and rhythm, normal S1-S2  Respiratory: Clear to auscultation bilaterally  Abdomen: Obese, soft, bowel sounds present, mild diffuse tenderness, no rebound, nondistended no organomegaly and no masses palpable.  Extremities: No cyanosis and no edema  Data Reviewed: Basic Metabolic Panel:  Lab 06/20/12 2130  NA 139  K 3.5  CL 102  CO2 22  GLUCOSE 80  BUN 12  CREATININE 0.66  CALCIUM 9.1  MG --  PHOS --   Liver Function Tests:  Lab 06/20/12 2210  AST 19  ALT 21  ALKPHOS 85  BILITOT 0.4  PROT 6.8  ALBUMIN 3.7   No results found for this basename: LIPASE:5,AMYLASE:5 in the last 168 hours No results found for this basename: AMMONIA:5 in the last 168 hours CBC:  Lab 06/20/12 2210  WBC 15.4*  NEUTROABS 13.8*  HGB 14.4  HCT 43.1  MCV 81.9  PLT 219   Cardiac Enzymes: No results found for this basename: CKTOTAL:5,CKMB:5,CKMBINDEX:5,TROPONINI:5 in the last 168 hours BNP (last 3 results) No results found for this basename: PROBNP:3 in the last 8760 hours CBG:  Lab 06/21/12 1026 06/21/12 0806 06/21/12 0548 06/21/12 0348 06/21/12 0146  GLUCAP 307* 367* 404* 450* 258*    No results found for this or any previous visit (from the past 240 hour(s)).   Studies: No results found.  Scheduled Meds:   . insulin aspart  0-5 Units Subcutaneous QHS  . insulin aspart  0-9 Units Subcutaneous TID WC  . insulin aspart  3 Units Subcutaneous TID WC  . insulin  pump   Subcutaneous Q4H  . losartan  50 mg Oral Daily  . pantoprazole  40 mg Oral Daily  . simvastatin  40 mg Oral Daily   Continuous Infusions:   . sodium chloride 125 mL/hr at 06/21/12 2130    Principal Problem:  *Hypoglycemia associated with diabetes Active Problems:  Pure hypercholesterolemia  Diabetes 1.5, managed as type 1  OSA (obstructive sleep apnea)  Pure hyperglyceridemia    Time spent:35    Kela Millin  [facility] Pager [phone]. If 8PM-8AM, please contact night-coverage at  [email or web address], password [facility] 06/21/2012, 11:11 AM  LOS: 1 day

## 2012-06-22 DIAGNOSIS — E162 Hypoglycemia, unspecified: Secondary | ICD-10-CM

## 2012-06-22 DIAGNOSIS — E119 Type 2 diabetes mellitus without complications: Secondary | ICD-10-CM

## 2012-06-22 LAB — BASIC METABOLIC PANEL
BUN: 11 mg/dL (ref 6–23)
Chloride: 102 mEq/L (ref 96–112)
GFR calc Af Amer: >90 mL/min (ref >90)
GFR calc non Af Amer: >90 mL/min (ref >90)
Potassium: 3.6 mEq/L (ref 3.5–5.1)
Sodium: 135 mEq/L (ref 135–145)

## 2012-06-22 LAB — GLUCOSE, CAPILLARY
Glucose-Capillary: 142 mg/dL — ABNORMAL HIGH (ref 70–99)
Glucose-Capillary: 63 mg/dL — ABNORMAL LOW (ref 70–99)
Glucose-Capillary: 72 mg/dL (ref 70–99)
Glucose-Capillary: 83 mg/dL (ref 70–99)
Glucose-Capillary: 95 mg/dL (ref 70–99)
Glucose-Capillary: 97 mg/dL (ref 70–99)

## 2012-06-22 LAB — CBC
HCT: 36.6 % — ABNORMAL LOW (ref 39.0–52.0)
MCHC: 33.3 g/dL (ref 30.0–36.0)
Platelets: 161 10*3/uL (ref 150–400)
RDW: 14.8 % (ref 11.5–15.5)
WBC: 7.4 10*3/uL (ref 4.0–10.5)

## 2012-06-22 MED ORDER — DIPHENOXYLATE-ATROPINE 2.5-0.025 MG PO TABS
1.0000 | ORAL_TABLET | Freq: Four times a day (QID) | ORAL | Status: DC
  Administered 2012-06-22 (×2): 1 via ORAL
  Filled 2012-06-22 (×2): qty 1

## 2012-06-22 MED ORDER — GLUCOSE 40 % PO GEL: 1.0000 | ORAL | Status: DC | PRN

## 2012-06-22 MED ORDER — GLUCOSE-VITAMIN C 4-6 GM-MG PO CHEW: 4.0000 | CHEWABLE_TABLET | ORAL | Status: DC | PRN

## 2012-06-22 MED ORDER — DIPHENOXYLATE-ATROPINE 2.5-0.025 MG PO TABS: 1.0000 | ORAL_TABLET | Freq: Four times a day (QID) | ORAL | Status: DC | PRN

## 2012-06-22 NOTE — Discharge Summary (Signed)
Physician Discharge Summary  Lantz Hermann ZOX:[id number] DOB: 16-Jul-1960 DOA: 06/20/2012  PCP: Sanda Linger, MD  Admit date: 06/20/2012 Discharge date: 06/22/2012  Time spent: <56minutes  Recommendations for Outpatient Follow-up:  1.   Discharge Diagnoses:  Principal Problem:  *Hypoglycemia associated with diabetes Active Problems:  Pure hypercholesterolemia  Diabetes 1.5, managed as type 1  OSA (obstructive sleep apnea)  Pure hyperglyceridemia  Leukocytosis  Diarrhea   Discharge Condition: improved/stable  Diet recommendation: Modified carbohydrate  Filed Weights   06/21/12 0150  Weight: 102.331 kg (225 lb 9.6 oz)    History of present illness:  Sorin Frimpong is an 52 y.o. male with hx of type I DM for many years, tightly control on insulin pump (basal 1.7 humalog x24 hours, ISR 1 per 10 above 100) with HgA1C 6.9, presents to the ER with symptomatic hypoglycemia. He found his BS was in the 40's. He took glucose tablets and sugar bars without significant improvements. He stopped his pump for 2 hours and eventually his BS went up to 80. No loss of consciousness or confusion. He also exercises regularly. I reviewed his record, and he had episode of hypoglycemia before, where his basal insulin infusion via pump had been 100 units per day. He was previously on Metformin, but since he loss over 30lbs, his metformin was discontinued, and his 100 units daily requirement has been reduced to 1.7 units per hour. In the ER, his BS went down again to 60 without significant symptoms. He was given an amp of D50 and he was started on a drip with D5. Hospitalist was asked to admit him for obs with respect to his hypoglycemia. He has started no new medication and had no change in his insulin pump. He has some adominal bloating and nausea, but this has resolved. He doesn't have any diarrhea.    Hospital Course:  Present on Admission:  . Hypoglycemia associated with diabetes  -As discussed above, Possibly  secondary to underlying infection/gastroenteritis  -Upon admission patient was placed on D5 drip after hypoglycemia recurred following D50, and his insulin pump was DC'd -The hypoglycemia resolved on the D5 drip and he was having markedly elevated blood sugars and so the D5 drip was discontinued and he was put back on his insulin pump. He subsequently had an another hypoglycemic episode that responded to POs, his clinically improved at this time and his blood sugars have remained stable and he wants to be discharged this p.m. and I think this is reasonable. He is followup with his  PCP outpatient. . Diarrhea/probable viral gastroenteritis  -Following patient's admission he had multiple episodes of diarrhea and reported that he had eaten a suspicious chicken sandwich prior to admission. C. difficile was done and came back negative. Stool cultures were ordered and are pending at this time. Following this C. difficile result that came back negative he was started on antidiarrheal meds and his diarrhea slowed down-since 4 AM today has only had one BM. He'll be discharged for outpatient followup with his PCP at this time. He stated followup on pending  stool culture. -. Leukocytosis  -Resolved, Likely secondary to #2, urinalysis negative and patient has no pulmonary symptoms  . OSA (obstructive sleep apnea)  . Insulin-dependent diabetes mellitus  -Patient to continue insulin pump with continued monitoring of his Accu-Cheks upon discharge and followup with his PCP.   Procedures: none Consultations:  none  Discharge Exam: Filed Vitals:   06/21/12 2235 06/22/12 0620 06/22/12 0900 06/22/12 1349  BP: 136/78 120/79  116/65 126/74  Pulse: 80 82 73 69  Temp: 98.2 F (36.8 C) 99 F (37.2 C) 97.8 F (36.6 C) 98.1 F (36.7 C)  TempSrc: Oral Oral Oral Oral  Resp: 18 20 18 20   Height:      Weight:      SpO2: 98% 96% 98% 98%   Exam:  General: Middle aged obese male in no apparent distress   Cardiovascular: Regular rate and rhythm, normal S1-S2  Respiratory: Clear to auscultation bilaterally  Abdomen: Obese, soft, bowel sounds present, nontender, no rebound, nondistended no organomegaly and no masses palpable.  Extremities: No cyanosis and no edema     Discharge Instructions  Discharge Orders    Future Appointments: Provider: Department: Dept Phone: Center:   07/06/2012 12:00 PM Lewayne Bunting, MD [facility] Main Office [place]) [phone] [id number]   07/18/2012 11:15 AM Romero Belling, MD [facility] PRIMARY CARE ENDOCRINOLOGY [phone] None   08/01/2012 9:00 AM Vevelyn Royals, RD [facility] [phone] NDM   09/22/2012 1:00 PM [name], MD [facility] -[facility] [phone] [facility]   11/03/2012 9:00 AM Barbaraann Share, MD [facility] [phone] None     Future Orders Please Complete By Expires   Diet Carb Modified      Increase activity slowly          Medication List     As of 06/22/2012  5:40 PM    TAKE these medications         diphenoxylate-atropine 2.5-0.025 MG per tablet   Commonly known as: LOMOTIL   Take 1 tablet by mouth 4 (four) times daily as needed for diarrhea or loose stools.      glucose blood test strip   6/day, and lancets 250.03      insulin aspart 100 UNIT/ML injection   Commonly known as: novoLOG   For use via pump, total 300 units per day      Insulin Infusion Pump Devi   by Does not apply route. novolog      losartan 50 MG tablet   Commonly known as: COZAAR   Take 50 mg by mouth daily.      pantoprazole 40 MG tablet   Commonly known as: PROTONIX   Take 1 tablet (40 mg total) by mouth daily.      PARADIGM RESERVOIR Misc   1 Device by Does not apply route daily.      MINIMED INFUSION SET-MMT 397 Misc   1 Device by Does not apply route daily.      simvastatin 40 MG tablet   Commonly known as: ZOCOR   Take 40 mg by mouth daily.            Follow-up Information    Follow up with Sanda Linger, MD. (in 1-2weeks, call for appt upon discharge)    Contact information:   [address] [place] [address] [phone]           The results of significant diagnostics from this hospitalization (including imaging, microbiology, ancillary and laboratory) are listed below for reference.    Significant Diagnostic Studies: No results found.  Microbiology: Recent Results (from the past 240 hour(s))  CLOSTRIDIUM DIFFICILE BY PCR     Status: Normal   Collection Time   06/21/12  1:06 PM      Component Value Range Status Comment   C difficile by pcr NEGATIVE  NEGATIVE Final  Labs: Basic Metabolic Panel:  Lab 06/22/12 1610 06/20/12 2210  NA 135 139  K 3.6 3.5  CL 102 102  CO2 23 22  GLUCOSE 282* 80  BUN 11 12  CREATININE 0.82 0.66  CALCIUM 7.7* 9.1  MG -- --  PHOS -- --   Liver Function Tests:  Lab 06/20/12 2210  AST 19  ALT 21  ALKPHOS 85  BILITOT 0.4  PROT 6.8  ALBUMIN 3.7   No results found for this basename: LIPASE:5,AMYLASE:5 in the last 168 hours No results found for this basename: AMMONIA:5 in the last 168 hours CBC:  Lab 06/22/12 0405 06/21/12 1225 06/20/12 2210  WBC 7.4 9.5 15.4*  NEUTROABS -- -- 13.8*  HGB 12.2* 14.1 14.4  HCT 36.6* 41.7 43.1  MCV 83.4 82.2 81.9  PLT 161 194 219   Cardiac Enzymes: No results found for this basename: CKTOTAL:5,CKMB:5,CKMBINDEX:5,TROPONINI:5 in the last 168 hours BNP: BNP (last 3 results) No results found for this basename: PROBNP:3 in the last 8760 hours CBG:  Lab 06/22/12 1653 06/22/12 1350 06/22/12 1211 06/22/12 1025 06/22/12 0751  GLUCAP 95 83 71 97 142*       Signed:  Kameko Hukill C  [facility] 06/22/2012, 5:40 PM

## 2012-06-22 NOTE — Progress Notes (Signed)
Inpatient Diabetes Program Recommendations  AACE/ADA: New Consensus Statement on Inpatient Glycemic Control (2013)  Target Ranges:  Prepandial:   less than 140 mg/dL      Peak postprandial:   less than 180 mg/dL (1-2 hours)      Critically ill patients:  140 - 180 mg/dL   Reason for Visit: Hypoglycemia  "My blood sugar dropped again last night and it took a few minutes to get it back up."  On insulin pump with pump settings per Dr. Everardo All.  Pt states he had pump settings adjusted 2 - 3 months ago. Pt has peripheral sensory neuropathy per Dr. Everardo All at last office visit dated 04/20/2012. Pt eating well at present.   : 2.00 ( 10% increase)  7AM: 1.80 (no change)  12Noon: 1.35 (no change)  6PM: 1.85 (slight increase)  9:30 PM: 2.25 (slight increase)  Results for JAROME, TRULL (MRN [id number]) as of 06/22/2012 12:54  Ref. Range 05/26/2012 10:15  Hemoglobin A1C Latest Range: 4.6-6.5 % 7.4 (H)  Results for IZIAH, CATES (MRN [id number]) as of 06/22/2012 12:54  Ref. Range 06/21/2012 22:12 06/21/2012 23:56 06/22/2012 00:31 06/22/2012 01:04 06/22/2012 03:28 06/22/2012 05:51 06/22/2012 07:51 06/22/2012 10:25 06/22/2012 12:11  Glucose-Capillary Latest Range: 70-99 mg/dL 79 63 (L) 63 (L) 72 086 (H) 186 (H) 142 (H) 97 71   Recommendation:  Pt to f/u with Dr. Everardo All for possible pump setting rate changes in light of hypoglycemia.  Pt agrees to f/u.  Questions answered. Will continue to follow while inpatient.  Thank you. Ailene Ards, RD, LDN, CDE Inpatient Diabetes Coordinator [phone]

## 2012-06-22 NOTE — Progress Notes (Addendum)
Hypoglycemic Event  CBG: 63  Treatment: 4 oz juice   Symptoms: none  Follow-up CBG: Time:0030 CBG Result:63  Possible Reasons for Event: insulin pump basal rate  Comments/MD notified: Pt's insulin pump is suspended. Pt is drinking another 4 oz juice. MD Donnamarie Poag notified. MD ordered to keep pump suspended and recheck cbg at 0100    Patsey Berthold  Remember to initiate Hypoglycemia Order Set & complete

## 2012-06-22 NOTE — Progress Notes (Addendum)
Pts CBG is now 72. MD notified. Orders to leave insulin pump turned off until CBG is over 200 and check cbgs q 2 hrs Val Eagle, Nydia Bouton

## 2012-06-22 NOTE — Progress Notes (Signed)
Pts CBG is 242 now. Pt turned insulin pump back on per md order. Will continue to monitor pt. Patsey Berthold

## 2012-06-25 LAB — STOOL CULTURE

## 2012-06-26 ENCOUNTER — Ambulatory Visit (HOSPITAL_COMMUNITY)
Admission: RE | Admit: 2012-06-26 | Discharge: 2012-06-26 | Disposition: A | Discharge status: Home / Self Care | Payer: 59 | Source: Ambulatory Visit | Attending: Infectious Diseases | Admitting: Infectious Diseases

## 2012-06-26 ENCOUNTER — Ambulatory Visit (INDEPENDENT_AMBULATORY_CARE_PROVIDER_SITE_OTHER): Payer: 59 | Admitting: Infectious Diseases

## 2012-06-26 ENCOUNTER — Encounter: Payer: Self-pay | Admitting: Infectious Diseases

## 2012-06-26 VITALS — BP 136/80 | HR 69 | Temp 98.2°F | Ht 70.0 in | Wt 228.0 lb

## 2012-06-26 DIAGNOSIS — M79609 Pain in unspecified limb: Secondary | ICD-10-CM

## 2012-06-26 DIAGNOSIS — M7989 Other specified soft tissue disorders: Secondary | ICD-10-CM | POA: Insufficient documentation

## 2012-06-26 DIAGNOSIS — I82619 Acute embolism and thrombosis of superficial veins of unspecified upper extremity: Secondary | ICD-10-CM | POA: Insufficient documentation

## 2012-06-26 MED ORDER — DOXYCYCLINE HYCLATE 100 MG PO TABS: 100.0000 mg | ORAL_TABLET | Freq: Two times a day (BID) | ORAL | Status: DC

## 2012-06-26 NOTE — Progress Notes (Signed)
  Subjective:    Patient ID: Leotis Isham, male    DOB: [birth year], 52 y.o.   MRN: [id number]  HPI 52 yo M with hx DM2 (x 22 yrs) was in hospital 2-6 and 2-7 (hypoglycemia, food poisining) and then developed what he thought was a bug-spider bite at the site of a previous IV site. Warm, tender (2/10). No fever or chills. Feels like swelling is moving it's way proximal.    Review of Systems  Constitutional: Negative for fever and chills.  Respiratory: Negative for cough, chest tightness and shortness of breath.   Cardiovascular: Negative for chest pain.  Gastrointestinal: Negative for diarrhea and constipation.       Objective:   Physical Exam  Constitutional: He appears well-developed and well-nourished.  HENT:  Mouth/Throat: No oropharyngeal exudate.  Eyes: EOM are normal. Pupils are equal, round, and reactive to light.  Neck: Neck supple.  Cardiovascular: Normal rate, regular rhythm and normal heart sounds.   Pulmonary/Chest: Effort normal and breath sounds normal.  Abdominal: Soft. Bowel sounds are normal. There is no tenderness. There is no rebound.  Musculoskeletal:       Arms: Lymphadenopathy:    He has no cervical adenopathy.          Assessment & Plan:

## 2012-06-26 NOTE — Addendum Note (Signed)
Addended by: Sarinah Doetsch C on: 06/26/2012 04:04 PM   Modules accepted: Orders

## 2012-06-26 NOTE — Progress Notes (Signed)
*  PRELIMINARY RESULTS* Vascular Ultrasound Left upper extremity venous duplex has been completed.  Preliminary findings: Left= No evidence of DVT.  Evidence of superficial thrombosis of the left basilic vein extending from the antecubital fossa to mid upper arm.  Called report to Dr. Ninetta Lights.   Farrel Demark, RDMS, RVT  06/26/2012, 4:13 PM

## 2012-06-26 NOTE — Assessment & Plan Note (Addendum)
Will send him for ASAP LUE doppler. Would consider DVT as first rule out, then consider cellulitis. Lab will call back with result. If DVT (-), would consider starting him on doxy.   Update 4pm- no DVT, there are superficial thrombosis. I have asked him to call his PMD with this result, apply warm compresses. Will give him doxy for 1 week.

## 2012-06-27 ENCOUNTER — Ambulatory Visit: Payer: 59 | Admitting: Cardiology

## 2012-06-30 ENCOUNTER — Encounter: Payer: Self-pay | Admitting: Pulmonary Disease

## 2012-07-06 ENCOUNTER — Ambulatory Visit (INDEPENDENT_AMBULATORY_CARE_PROVIDER_SITE_OTHER): Payer: 59 | Admitting: Cardiology

## 2012-07-06 ENCOUNTER — Other Ambulatory Visit: Payer: Self-pay | Admitting: Endocrinology

## 2012-07-06 ENCOUNTER — Encounter: Payer: Self-pay | Admitting: Cardiology

## 2012-07-06 VITALS — BP 130/81 | HR 74 | Ht 70.0 in | Wt 227.0 lb

## 2012-07-06 DIAGNOSIS — I370 Nonrheumatic pulmonary valve stenosis: Secondary | ICD-10-CM

## 2012-07-06 DIAGNOSIS — E7800 Pure hypercholesterolemia, unspecified: Secondary | ICD-10-CM

## 2012-07-06 DIAGNOSIS — E139 Other specified diabetes mellitus without complications: Secondary | ICD-10-CM

## 2012-07-06 DIAGNOSIS — I379 Nonrheumatic pulmonary valve disorder, unspecified: Secondary | ICD-10-CM

## 2012-07-06 DIAGNOSIS — E119 Type 2 diabetes mellitus without complications: Secondary | ICD-10-CM

## 2012-07-06 NOTE — Assessment & Plan Note (Signed)
Management per primary care. He is on appropriate medications including aspirin, statin and ARB. He exercises routinely and has no cardiac symptoms.

## 2012-07-06 NOTE — Progress Notes (Signed)
HPI: 52 year old male for evaluation of murmur. Echocardiogram in January of 2014 showed normal LV function, grade 2 diastolic dysfunction, mild left atrial enlargement and mild pulmonic stenosis with a peak gradient of 16 mm of mercury. Patient exercises routinely. He denies dyspnea on exertion, orthopnea, PND, pedal edema, palpitations, syncope or chest pain.  Current Outpatient Prescriptions  Medication Sig Dispense Refill  . glucose blood (ONE TOUCH ULTRA TEST) test strip 6/day, and lancets 250.03  600 each  12  . insulin aspart (NOVOLOG) 100 UNIT/ML injection For use via pump, total 100 units per day      . Insulin Infusion Pump DEVI by Does not apply route. novolog      . losartan (COZAAR) 50 MG tablet Take 50 mg by mouth daily.      . pantoprazole (PROTONIX) 40 MG tablet Take 1 tablet (40 mg total) by mouth daily.  30 tablet  5  . simvastatin (ZOCOR) 40 MG tablet Take 40 mg by mouth daily.      . Insulin Infusion Pump Supplies (PARADIGM RESERVOIR ) MISC 1 Device by Does not apply route daily.       No current facility-administered medications for this visit.    Allergies  Allergen Reactions  . Lisinopril   . Penicillins   . Shellfish Allergy     Past Medical History  Diagnosis Date  . Diabetes mellitus   . GERD (gastroesophageal reflux disease)   . Hypertension   . Hyperlipidemia   . OSA (obstructive sleep apnea)     Past Surgical History  Procedure Laterality Date  . Tonsillectomy      History   Social History  . Marital Status: Married    Spouse Name: N/A    Number of Children: 3  . Years of Education: N/A   Occupational History  . RADIATION SAFETY OFFICER [employer]   Social History Main Topics  . Smoking status: Never Smoker   . Smokeless tobacco: Never Used  . Alcohol Use: No  . Drug Use: No  . Sexually Active: Yes   Other Topics Concern  . Not on file   Social History Narrative  . No narrative on file    Family History  Problem Relation  Age of Onset  . Colon cancer Maternal Grandfather   . Colon cancer Paternal Grandfather   . Alcohol abuse Neg Hx   . Asthma Neg Hx   . Diabetes Neg Hx   . Heart disease Father     Valve replacement; pacemaker  . Hyperlipidemia Neg Hx   . Hypertension Neg Hx   . Kidney disease Neg Hx   . Stroke Neg Hx     ROS: no fevers or chills, productive cough, hemoptysis, dysphasia, odynophagia, melena, hematochezia, dysuria, hematuria, rash, seizure activity, orthopnea, PND, pedal edema, claudication. Remaining systems are negative.  Physical Exam:   Blood pressure 130/81, pulse 74, height 5\' 10"  (1.778 m), weight 227 lb (102.967 kg).  General:  Well developed/well nourished in NAD Skin warm/dry Patient not depressed No peripheral clubbing Back-normal HEENT-normal/normal eyelids Neck supple/normal carotid upstroke bilaterally; no bruits; no JVD; no thyromegaly chest - CTA/ normal expansion CV - RRR/normal S1 and S2; no rubs or gallops;  PMI nondisplaced, 1/6 systolic murmur upper right sternal border. Abdomen -NT/ND, no HSM, no mass, + bowel sounds, no bruit 2+ femoral pulses, no bruits Ext-no edema, chords, 2+ DP Neuro-grossly nonfocal  ECG 05/25/2012-sinus rhythm, first degree AV block, PAC, right bundle branch block.

## 2012-07-06 NOTE — Assessment & Plan Note (Signed)
Patient with very soft murmur on examination. Echocardiogram suggests very mild pulmonic stenosis. No symptoms and right heart not enlarged. We will plan to repeat echocardiogram in one year.

## 2012-07-06 NOTE — Assessment & Plan Note (Signed)
Continue statin. 

## 2012-07-06 NOTE — Patient Instructions (Addendum)
Your physician wants you to follow-up in: ONE YEAR WITH DR CRENSHAW You will receive a reminder letter in the mail two months in advance. If you don't receive a letter, please call our office to schedule the follow-up appointment.  

## 2012-07-07 ENCOUNTER — Other Ambulatory Visit: Payer: Self-pay | Admitting: Endocrinology

## 2012-07-07 MED ORDER — INSULIN ASPART 100 UNIT/ML ~~LOC~~ SOLN: SUBCUTANEOUS | Status: DC

## 2012-07-18 ENCOUNTER — Encounter: Payer: Self-pay | Admitting: Endocrinology

## 2012-07-18 ENCOUNTER — Ambulatory Visit (INDEPENDENT_AMBULATORY_CARE_PROVIDER_SITE_OTHER): Payer: 59 | Admitting: Endocrinology

## 2012-07-18 VITALS — BP 132/70 | HR 76 | Wt 227.0 lb

## 2012-07-18 DIAGNOSIS — E139 Other specified diabetes mellitus without complications: Secondary | ICD-10-CM

## 2012-07-18 DIAGNOSIS — E119 Type 2 diabetes mellitus without complications: Secondary | ICD-10-CM

## 2012-07-18 NOTE — Progress Notes (Signed)
Subjective:    Patient ID: Zachary Graham, male    DOB: 05-03-1961, 52 y.o.   MRN: [id number]  HPI pt returns for f/u of type 1 DM (dx'ed 1993; complicated by peripheral sensory neuropathy; he has been on insulin since dx; he has been on pump rx since 2006; he has never had severe hypoglycemia or DKA).  He averages 130 units of novolog per day, via his pump.  3 weeks ago, he was admitted due to difficulty raising his glucose.  He did not have LOC, though.  Aside from n/v, he is unable to cite precip factor.  The episode happened in the afternoon, which is in general the lowest time of the day. no cbg record, but states otherwise, there is no trend throughout the day.  He continues to have 3-4 episodes of mild hypoglycemia per week.   Past Medical History  Diagnosis Date  . Diabetes mellitus   . GERD (gastroesophageal reflux disease)   . Hypertension   . Hyperlipidemia   . OSA (obstructive sleep apnea)     Past Surgical History  Procedure Laterality Date  . Tonsillectomy      History   Social History  . Marital Status: Married    Spouse Name: N/A    Number of Children: 3  . Years of Education: N/A   Occupational History  . 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 Smoking status: Never Smoker   . Smokeless tobacco: Never Used  . Alcohol Use: No  . Drug Use: No  . Sexually Active: Yes   Other Topics Concern  . Not on file   Social History Narrative  . No narrative on file    Current Outpatient Prescriptions on File Prior to Visit  Medication Sig Dispense Refill  . glucose blood (ONE TOUCH ULTRA TEST) test strip 6/day, and lancets 250.03  600 each  12  . insulin aspart (NOVOLOG) 100 UNIT/ML injection For use via pump, total 100 units per day  10 vial  3  . Insulin Infusion Pump DEVI by Does not apply route. novolog      . Insulin Infusion Pump Supplies (PARADIGM RESERVOIR ) MISC 1 Device by Does not apply route daily.      Marland Kitchen losartan (COZAAR)  50 MG tablet Take 50 mg by mouth daily.      . pantoprazole (PROTONIX) 40 MG tablet Take 1 tablet (40 mg total) by mouth daily.  30 tablet  5  . simvastatin (ZOCOR) 40 MG tablet Take 40 mg by mouth daily.       No current facility-administered medications on file prior to visit.    Allergies  Allergen Reactions  . Lisinopril   . Penicillins   . Shellfish Allergy     Family History  Problem Relation Age of Onset  . Colon cancer Maternal Grandfather   . Colon cancer Paternal Grandfather   . Alcohol abuse Neg Hx   . Asthma Neg Hx   . Diabetes Neg Hx   . Heart disease Father     Valve replacement; pacemaker  . Hyperlipidemia Neg Hx   . Hypertension Neg Hx   . Kidney disease Neg Hx   . Stroke Neg Hx     BP 132/70  Pulse 76  Wt 227 lb (102.967 kg)  BMI 32.57 kg/m2  SpO2 97%    Review of Systems He has a few lbs of weight gain.    Objective:   Physical Exam VITAL  SIGNS:  See vs page GENERAL: no distress Pulses: dorsalis pedis intact bilat.   Feet: no deformity.  no ulcer on the feet.  feet are of normal color and temp.  no edema Neuro: sensation is intact to touch on the feet.        Assessment & Plan:  Due to hypoglycemia, we can't pursue more aggressive control for now.  The best next step is a continuous glucose monitor.

## 2012-07-18 NOTE — Patient Instructions (Addendum)
check your blood sugar 6 times a day--before the 3 meals, and at bedtime.  also check if you have symptoms of your blood sugar being too high or too low.  please keep a record of the readings and bring it to your next appointment here.  please call us sooner if your blood sugar goes below 70, or if it stays over 200.  Please continue basal rate of 1.7 units/hr, 24 hrs a day.   continue mealtime bolus of 1 unit/ 2.5 grams carbohydrate.  However, subtract 4 units from your calculated lunch bolus.   continue correction bolus (which some people call "sensitivity," or "insulin sensitivity ratio," or just "isr") of 1 unit for each 10 by which your glucose exceeds 100. Please come back for a follow-up appointment in 3 months.   Please pursue the continuous glucose monitor, as this is best for your situation.

## 2012-08-01 ENCOUNTER — Ambulatory Visit: Payer: 59 | Admitting: *Deleted

## 2012-08-02 ENCOUNTER — Encounter: Payer: 59 | Attending: Internal Medicine | Admitting: *Deleted

## 2012-08-02 DIAGNOSIS — E109 Type 1 diabetes mellitus without complications: Secondary | ICD-10-CM | POA: Insufficient documentation

## 2012-08-02 DIAGNOSIS — E11649 Type 2 diabetes mellitus with hypoglycemia without coma: Secondary | ICD-10-CM

## 2012-08-02 DIAGNOSIS — E139 Other specified diabetes mellitus without complications: Secondary | ICD-10-CM

## 2012-08-02 DIAGNOSIS — Z713 Dietary counseling and surveillance: Secondary | ICD-10-CM | POA: Insufficient documentation

## 2012-08-02 NOTE — Progress Notes (Signed)
  Pump Follow Up Progress Note  Reviewed blood glucose logs on 08/02/2012 via:  [facility] and found the following:           Hypoglycemia Hyperglycemia Comments  Overnight Period:   YES Basal   Pre-Meal:    Breakfast      Lunch      Supper     Post-Meal: Breakfast      Lunch      Supper  YES Carb ratio  Bedtime:   YES Carb ratio now may look at basal later.   Comments: Patient states the following problems:  Incident of severe hypoglycemia on 06/20/12 that required assistance and Emergency Room visit with hospitalization  Several incidents of finding tape saturated with insulin either from site disconnecting with movement or from below the tape indicating the cannula becoming dislodged  States no awareness anymore with high or low BG excursions, expresses desire to pursue CGM through [employer]   Reports he is not changing his sites out as often as directed, sometimes for up to 5 days.  Reports that he Suspends the pump when disconnecting for shower and sometimes forgets to resume the basal when putting pump back on  Intervention:  Discussed [facility] with patient and he decided to increase his basal rate at Midnight by 10% to help with overnight hyperglycemia as well as increasing his supper carb ratio by 10% to help with post supper and bedtime hyperglycemia.  Offered Silhouette infusion set to patient regarding the saturated sites as it has a longer cannula that enters at a 45 degree angle with less risk of cannula issues. He is not willing to try at this time due to length of the needle for insertion and the number of Quick Sets he currently has, but agrees to track the frequency of this problem by using the "Other" feature on his pump under Capture Events so they will show up on the [facility] Reports.  Teacher, adult education regarding desire to pursue CGM so she can oversee the process and be in contact with patient directly  Encouraged him to change out  his sites no longer than every 3 days due to risk of scar tissue possibly occluding the cannula as well as increased risk of infection.  Suggested he not suspend the pump for showers as he would only lose maybe 1 unit of basal insulin during that time frame and eliminate the risk of not remembering to take pump out of suspend.  Plan: Follow up with me in 6 weeks to assess progress. Also asked patient to notify me when CGM ships so we can make an appointment for training.

## 2012-08-04 ENCOUNTER — Ambulatory Visit: Payer: 59 | Admitting: Internal Medicine

## 2012-09-06 ENCOUNTER — Encounter: Payer: 59 | Attending: Internal Medicine | Admitting: *Deleted

## 2012-09-06 DIAGNOSIS — E139 Other specified diabetes mellitus without complications: Secondary | ICD-10-CM

## 2012-09-06 DIAGNOSIS — E109 Type 1 diabetes mellitus without complications: Secondary | ICD-10-CM | POA: Insufficient documentation

## 2012-09-06 DIAGNOSIS — Z713 Dietary counseling and surveillance: Secondary | ICD-10-CM | POA: Insufficient documentation

## 2012-09-06 NOTE — Progress Notes (Signed)
  Pump Follow Up Progress Note for Zachary Graham, Zachary Graham  DOB:, 10-19-1960  Reviewed blood glucose logs on 09/06/2012 via:  [facility] and found the following:           Hypoglycemia Hyperglycemia Comments  Overnight Period:   YES Basal   Pre-Meal:    Breakfast      Lunch      Supper     Post-Meal: Breakfast      Lunch      Supper     Bedtime:       Comments: Patient states the following problems:  Overall average BG has dropped from 201 to 184 mg/dl without any significant hypoglycemia episodes.  Fewer incidents of finding tape saturated with insulin. He is not interested in changing the type of infusion set at this time. He is willing to Tristyn these as "Other" Events on his Capture Event menu on his pump so we can track frequency and correlate with BG patterns if any.  Reports he is now changing his sites out more often as directed  Reports that he no longer Suspends the pump when disconnecting for shower   Intervention:  Discussed [facility] Reports with patient and he decided to increase his basal rate at 9:30 PM by 5% to help with overnight hyperglycemia   Plan: Follow up with me in 2 months to assess progress.   Plan: Continue to aim for 30-60 grams of Carb per meal Continue using Temp Basal during exercise at 110% to address rise in BG with increased activity  Continue using Bolus Wizard as able so we can continue to track BG data Consider giving a small bolus of about 2 units when you change out your set to offset historical rise in BG at that time. Consider using Capture Event "Other" if you have any more saturated tape issues.

## 2012-09-06 NOTE — Patient Instructions (Signed)
Plan: Continue to aim for 30-60 grams of Carb per meal Continue using Temp Basal during exercise at 110% to address rise in BG with increased activity  Continue using Bolus Wizard as able so we can continue to track BG data Consider giving a small bolus of about 2 units when you change out your set to offset historical rise in BG at that time. Consider using Capture Event "Other" if you have any more saturated tape issues.

## 2012-09-22 ENCOUNTER — Encounter: Payer: Self-pay | Admitting: Internal Medicine

## 2012-09-22 ENCOUNTER — Ambulatory Visit (INDEPENDENT_AMBULATORY_CARE_PROVIDER_SITE_OTHER): Payer: 59 | Admitting: Internal Medicine

## 2012-09-22 ENCOUNTER — Other Ambulatory Visit (INDEPENDENT_AMBULATORY_CARE_PROVIDER_SITE_OTHER): Payer: 59

## 2012-09-22 VITALS — BP 118/64 | HR 81 | Temp 97.7°F | Resp 16 | Wt 237.0 lb

## 2012-09-22 DIAGNOSIS — E139 Other specified diabetes mellitus without complications: Secondary | ICD-10-CM

## 2012-09-22 DIAGNOSIS — E119 Type 2 diabetes mellitus without complications: Secondary | ICD-10-CM

## 2012-09-22 DIAGNOSIS — E7800 Pure hypercholesterolemia, unspecified: Secondary | ICD-10-CM

## 2012-09-22 DIAGNOSIS — D510 Vitamin B12 deficiency anemia due to intrinsic factor deficiency: Secondary | ICD-10-CM

## 2012-09-22 LAB — IBC PANEL
Saturation Ratios: 16.6 % — ABNORMAL LOW (ref 20.0–50.0)
Transferrin: 296.7 mg/dL (ref 212.0–360.0)

## 2012-09-22 LAB — BASIC METABOLIC PANEL
Calcium: 9.1 mg/dL (ref 8.4–10.5)
GFR: 78.77 mL/min (ref >60.00)
Glucose, Bld: 319 mg/dL — ABNORMAL HIGH (ref 70–99)
Sodium: 133 mEq/L — ABNORMAL LOW (ref 135–145)

## 2012-09-22 LAB — CBC WITH DIFFERENTIAL/PLATELET
Basophils Absolute: 0 10*3/uL (ref 0.0–0.1)
Basophils Relative: 0.4 % (ref 0.0–3.0)
Eosinophils Absolute: 0.3 10*3/uL (ref 0.0–0.7)
Lymphocytes Relative: 29 % (ref 12.0–46.0)
MCHC: 34.5 g/dL (ref 30.0–36.0)
Neutrophils Relative %: 59.4 % (ref 43.0–77.0)
RBC: 5.06 Mil/uL (ref 4.22–5.81)

## 2012-09-22 LAB — FERRITIN: Ferritin: 83.3 ng/mL (ref 22.0–322.0)

## 2012-09-22 LAB — HM DIABETES FOOT EXAM

## 2012-09-22 LAB — FOLATE: Folate: >24.8 ng/mL (ref >5.9)

## 2012-09-22 MED ORDER — EZETIMIBE 10 MG PO TABS: 10.0000 mg | ORAL_TABLET | Freq: Every day | ORAL | Status: DC

## 2012-09-22 NOTE — Progress Notes (Signed)
Subjective:    Patient ID: Zachary Graham, male    DOB: December 26, 1960, 52 y.o.   MRN: [id number]  Diabetes He presents for his follow-up diabetic visit. He has type 1 diabetes mellitus. His disease course has been worsening. There are no hypoglycemic associated symptoms. Pertinent negatives for hypoglycemia include no dizziness. Pertinent negatives for diabetes include no blurred vision, no chest pain, no fatigue, no foot paresthesias, no foot ulcerations, no polydipsia, no polyphagia, no polyuria, no visual change, no weakness and no weight loss. There are no hypoglycemic complications. There are no diabetic complications. Current diabetic treatment includes insulin pump. He is following a generally unhealthy diet. When asked about meal planning, he reported none. He participates in exercise intermittently. His home blood glucose trend is increasing steadily. His breakfast blood glucose range is generally 140-180 mg/dl. His lunch blood glucose range is generally 180-200 mg/dl. His dinner blood glucose range is generally >200 mg/dl. His highest blood glucose is >200 mg/dl. His overall blood glucose range is >200 mg/dl. An ACE inhibitor/angiotensin II receptor blocker is being taken. He does not see a podiatrist.Eye exam is current.      Review of Systems  Constitutional: Positive for unexpected weight change (weight gain). Negative for fever, chills, weight loss, diaphoresis, activity change, appetite change and fatigue.  HENT: Negative.   Eyes: Negative.  Negative for blurred vision.  Respiratory: Negative.  Negative for apnea, cough, choking, chest tightness, shortness of breath, wheezing and stridor.   Cardiovascular: Negative.  Negative for chest pain, palpitations and leg swelling.  Gastrointestinal: Negative.  Negative for nausea, vomiting, abdominal pain, diarrhea and constipation.  Endocrine: Negative.  Negative for polydipsia, polyphagia and polyuria.  Genitourinary: Negative.    Musculoskeletal: Negative.  Negative for myalgias, back pain, joint swelling, arthralgias and gait problem.  Skin: Negative.   Allergic/Immunologic: Negative.   Neurological: Negative.  Negative for dizziness, weakness and light-headedness.  Hematological: Negative.  Negative for adenopathy. Does not bruise/bleed easily.  Psychiatric/Behavioral: Negative.        Objective:   Physical Exam  Vitals reviewed. Constitutional: He is oriented to person, place, and time. He appears well-developed and well-nourished. No distress.  HENT:  Head: Normocephalic and atraumatic.  Mouth/Throat: Oropharynx is clear and moist. No oropharyngeal exudate.  Eyes: Conjunctivae are normal. Right eye exhibits no discharge. Left eye exhibits no discharge. No scleral icterus.  Neck: Normal range of motion. Neck supple. No JVD present. No tracheal deviation present. No thyromegaly present.  Cardiovascular: Normal rate, regular rhythm, normal heart sounds and intact distal pulses.  Exam reveals no gallop and no friction rub.   No murmur heard. Pulmonary/Chest: Effort normal and breath sounds normal. No stridor. No respiratory distress. He has no wheezes. He has no rales. He exhibits no tenderness.  Abdominal: Soft. Bowel sounds are normal. He exhibits no distension and no mass. There is no tenderness. There is no rebound and no guarding.  Musculoskeletal: Normal range of motion. He exhibits no edema and no tenderness.  Lymphadenopathy:    He has no cervical adenopathy.  Neurological: He is oriented to person, place, and time.  Skin: Skin is warm and dry. No rash noted. He is not diaphoretic. No erythema. No pallor.  Psychiatric: He has a normal mood and affect. His behavior is normal. Judgment and thought content normal.     Lab Results  Component Value Date   WBC 7.4 06/22/2012   HGB 12.2* 06/22/2012   HCT 36.6* 06/22/2012   PLT 161 06/22/2012  GLUCOSE 282* 06/22/2012   CHOL 214* 05/26/2012   TRIG 178.0*  05/26/2012   HDL 43.40 05/26/2012   LDLDIRECT 132.1 05/26/2012   ALT 21 06/20/2012   AST 19 06/20/2012   NA 135 06/22/2012   K 3.6 06/22/2012   CL 102 06/22/2012   CREATININE 0.82 06/22/2012   BUN 11 06/22/2012   CO2 23 06/22/2012   TSH 1.222 06/21/2012   PSA 0.31 09/21/2011   HGBA1C 7.4* 05/26/2012   MICROALBUR 0.9 09/21/2011       Assessment & Plan:

## 2012-09-22 NOTE — Patient Instructions (Addendum)
Diabetes, Type 1 Diabetes is a long-term (chronic) disease. It occurs when the cells in the pancreas that make insulin (a hormone) are destroyed and can no longer make insulin. Type 1 diabetes was also previously called juvenile-onset diabetes. It most often occurs before the age of 30, but it can also occur in older people. CAUSES  Among other factors, the following may cause type 1 diabetes:  Genetics. This means it may be passed to you by your parents.  The beta cells that make insulin are destroyed. The cause of this is unknown. SYMPTOMS   Urinating more than usual (or bed-wetting in children).  Drinking more than usual.  Irritability.  Feeling very hungry.  Weight loss (may be rapid).  Nausea and vomiting.  Abdominal pain.  Feeling more tired than usual (fatigue).  Rapid breathing.  Difficulty staying awake.  Night sweats. DIAGNOSIS  Your blood is tested to determine whether you have type 1 diabetes. TREATMENT   You will need to check your blood glucose (sugar) levels several times a day.  You will need to balance insulin, a healthy meal plan, and exercise to maintain normal blood glucose.  Education and ongoing support is recommended.  You should have regular checkups and immunizations. HOME CARE INSTRUCTIONS   Never run out of insulin. It is needed every day.  Do not skip insulin doses.  Do not skip meals. Eat healthy.  Follow your treatment and monitoring plan.  Wear a pendant or bracelet stating you have diabetes and take insulin.  If you start a new exercise or sport, watch for low blood glucose (hypoglycemia) symptoms. Insulin dosing may need to be adjusted.  Follow up with your caregiver regularly.  Tell your workplace or school about your diabetes treatment plan. SEEK MEDICAL CARE IF:   You have problems keeping your blood glucose in target range.  You have blood glucose readings that are often too high or too low.  You have problems with  your medicines.  You have symptoms of an illness that do not improve after 24 hours.  You have a sore or wound that is not healing.  You notice a change in vision or a new problem with your vision.  You have a fever. MAKE SURE YOU:  Understand these instructions.  Will watch your condition.  Will get help right away if you are not doing well or get worse. Document Released: 04/30/2000 Document Revised: 07/26/2011 Document Reviewed: 10/19/2010 ExitCare Patient Information 2013 [employer].  

## 2012-09-24 ENCOUNTER — Encounter: Payer: Self-pay | Admitting: Internal Medicine

## 2012-09-24 NOTE — Assessment & Plan Note (Signed)
I will check his CBC and his vitamin levels today 

## 2012-09-24 NOTE — Assessment & Plan Note (Signed)
He has not met his LDL goal so I have asked him to add zetia to the statin 

## 2012-09-24 NOTE — Assessment & Plan Note (Addendum)
His A1C is up to 8.2 and his RBS = 319 today He will work on his lifestyle modifications and will adjust his insulin pump accordingly

## 2012-10-03 ENCOUNTER — Other Ambulatory Visit: Payer: Self-pay | Admitting: Internal Medicine

## 2012-10-12 ENCOUNTER — Other Ambulatory Visit: Payer: Self-pay | Admitting: *Deleted

## 2012-10-12 MED ORDER — GLUCOSE BLOOD VI STRP: ORAL_STRIP | Status: DC

## 2012-10-16 ENCOUNTER — Other Ambulatory Visit: Payer: Self-pay | Admitting: Endocrinology

## 2012-10-16 MED ORDER — "INSULIN SYRINGE 29G X 1/2"" 1 ML MISC": 1.0000 | Status: DC

## 2012-10-17 ENCOUNTER — Encounter: Payer: 59 | Attending: Internal Medicine | Admitting: *Deleted

## 2012-10-17 DIAGNOSIS — E139 Other specified diabetes mellitus without complications: Secondary | ICD-10-CM

## 2012-10-17 DIAGNOSIS — Z713 Dietary counseling and surveillance: Secondary | ICD-10-CM | POA: Insufficient documentation

## 2012-10-17 DIAGNOSIS — E109 Type 1 diabetes mellitus without complications: Secondary | ICD-10-CM | POA: Insufficient documentation

## 2012-10-17 NOTE — Patient Instructions (Signed)
Plan: Continue to aim for 30-60 grams of Carb per meal Consider getting back to the gym as your time permits daily to improve BG control and help with weight loss. Continue using Temp Basal during exercise at 110% to address rise in BG with increased activity  Continue using Bolus Wizard as able so we can continue to track BG data Continue giving a small bolus of about 2 units when you change out your set to offset historical rise in BG at that time. Consider using Capture Event "Other" if you have any more saturated tape issues. Get referral from [facility] to wear iPro CGM for about a week so we can assess pump settings more accurately.

## 2012-10-17 NOTE — Progress Notes (Signed)
  Pump Follow Up Progress Note for Zachary, Graham  DOB:, 06-Jun-1960  Reviewed blood glucose logs on 10/17/2012 via:  [facility] and found the following:           Hypoglycemia Hyperglycemia Comments  Overnight Period:   YES Basal   Pre-Meal:    Breakfast      Lunch  YES Basal   Supper  YES Basal  Post-Meal: Breakfast      Lunch      Supper     Bedtime:   YES Basal    Comments: Patient states the following problems:  He has been exercising less due to a very full work schedule, long hours. They are hiring a new position which should improve that and he plans to resume gym exercises as soon as possible.  Average BG has increased back to 203 for past 2 weeks.  No issues with wet tape anymore  Basal % is averaging 23% of TDD so suggest increasing Basal Rate by 10% for all rates, see below. Will wait to assess Carb ratio and Sensitivity Factor until after he resumes his exercise routine  Intervention:  Discussed [facility] Reports with patient and he decided to increase his basal rate 10% x 24 hours to help with hyperglycemia   Pump Settings: Date: Current Date: 10/17/2012  Changes in bold print   Basal Rate: Carb Ratio Sensitivity  Basal Rate: Carb Ratio Sensitivity   MN: 2.20 MN: 2.2 10 MN: 2.40  (+) MN: 2.2 10  7  AM 1.80 11 AM: 2.7  7 AM 2.00  (+) 11 AM: 2.7   12 N 1.35 5 PM: 2.2  12 N 1.50  (+) 5 PM: 2.2    6 PM 1.85   6 PM 2.00  (+)    9:30 P 2.35   9:30 PM 2.60  (+)                        Plan: Continue to aim for 30-60 grams of Carb per meal Consider getting back to the gym as your time permits daily to improve BG control and help with weight loss. Continue using Temp Basal during exercise at 110% to address rise in BG with increased activity  Continue using Bolus Wizard as able so we can continue to track BG data Continue giving a small bolus of about 2 units when you change out your set to offset historical rise in BG at that time. Consider using Capture Event  "Other" if you have any more saturated tape issues. Get referral from [facility] to wear iPro CGM for about a week so we can assess pump settings more accurately.

## 2012-10-31 ENCOUNTER — Encounter: Payer: 59 | Admitting: *Deleted

## 2012-11-03 ENCOUNTER — Ambulatory Visit: Payer: 59 | Admitting: Pulmonary Disease

## 2012-11-06 ENCOUNTER — Other Ambulatory Visit: Payer: Self-pay | Admitting: *Deleted

## 2012-11-08 ENCOUNTER — Other Ambulatory Visit: Payer: Self-pay | Admitting: *Deleted

## 2012-11-08 ENCOUNTER — Encounter: Payer: 59 | Admitting: *Deleted

## 2012-11-08 MED ORDER — LOSARTAN POTASSIUM 50 MG PO TABS: 50.0000 mg | ORAL_TABLET | Freq: Every day | ORAL | Status: DC

## 2012-11-09 ENCOUNTER — Ambulatory Visit (INDEPENDENT_AMBULATORY_CARE_PROVIDER_SITE_OTHER): Payer: 59 | Admitting: Endocrinology

## 2012-11-09 ENCOUNTER — Encounter: Payer: Self-pay | Admitting: Endocrinology

## 2012-11-09 VITALS — BP 128/72 | HR 80 | Ht 70.0 in | Wt 244.0 lb

## 2012-11-09 DIAGNOSIS — E119 Type 2 diabetes mellitus without complications: Secondary | ICD-10-CM

## 2012-11-09 DIAGNOSIS — E139 Other specified diabetes mellitus without complications: Secondary | ICD-10-CM

## 2012-11-09 MED ORDER — GLUCOSE BLOOD VI STRP: 1.0000 | ORAL_STRIP | Freq: Four times a day (QID) | Status: DC

## 2012-11-09 NOTE — Patient Instructions (Addendum)
check your blood sugar 4 times a day.   Please take a basal rate of 2.5 units/hr, 24 hrs a day.   continue mealtime bolus of 1 unit/ 2.5 grams carbohydrate.  However, subtract 10 units from your calculated lunch bolus.  continue correction bolus (which some people call "sensitivity," or "insulin sensitivity ratio," or just "isr") of 1 unit for each 10 by which your glucose exceeds 100. Please come back for a follow-up appointment in 3-4 weeks.    Please upload your continuous glucose monitor just before the visit.

## 2012-11-09 NOTE — Progress Notes (Signed)
Subjective:    Patient ID: Zachary Graham, male    DOB: 1961-01-03, 52 y.o.   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  He got a continuous glucose monitor yesterday.  no cbg record, but states cbg's are extremely variable.  He has adjusted his pump setting, so he is on multiple basal rates per day.   Past Medical History  Diagnosis Date  . Diabetes mellitus   . GERD (gastroesophageal reflux disease)   . Hypertension   . Hyperlipidemia   . OSA (obstructive sleep apnea)     Past Surgical History  Procedure Laterality Date  . Tonsillectomy      History   Social History  . Marital Status: Married    Spouse Name: N/A    Number of Children: 3  . Years of Education: N/A   Occupational History  . 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 Smoking status: Never Smoker   . Smokeless tobacco: Never Used  . Alcohol Use: No  . Drug Use: No  . Sexually Active: Yes   Other Topics Concern  . Not on file   Social History Narrative  . No narrative on file    Current Outpatient Prescriptions on File Prior to Visit  Medication Sig Dispense Refill  . ezetimibe (ZETIA) 10 MG tablet Take 1 tablet (10 mg total) by mouth daily.  90 tablet  3  . insulin aspart (NOVOLOG) 100 UNIT/ML injection For use via pump, total 100 units per day  10 vial  3  . Insulin Infusion Pump Supplies (PARADIGM RESERVOIR ) MISC 1 Device by Does not apply route daily.      .  INSULIN SYRINGE 1CC/29G 29G X 1/2" 1 ML MISC 1 Syringe by Does not apply route every 3 (three) days.  10 each  3  . losartan (COZAAR) 50 MG tablet Take 1 tablet (50 mg total) by mouth daily.  30 tablet  4  . pantoprazole (PROTONIX) 40 MG tablet TAKE 1 TABLET BY MOUTH ONCE DAILY  90 tablet  2  . simvastatin (ZOCOR) 40 MG tablet Take 40 mg by mouth daily.       No current facility-administered medications on file prior to visit.    Allergies  Allergen Reactions  . Lisinopril   . Penicillins   . Shellfish Allergy     Family History  Problem Relation Age of Onset  . Colon cancer Maternal Grandfather   . Colon cancer Paternal Grandfather   . Alcohol abuse Neg Hx   . Asthma Neg Hx   . Diabetes Neg Hx   . Heart disease Father     Valve replacement; pacemaker  . Hyperlipidemia Neg Hx   . Hypertension Neg Hx   . Kidney disease Neg Hx   . Stroke Neg Hx    BP 128/72  Pulse 80  Ht 5\' 10"  (1.778 m)  Wt 244 lb (110.678  kg)  BMI 35.01 kg/m2  SpO2 98%  Review of Systems denies hypoglycemia    Objective:   Physical Exam VITAL SIGNS:  See vs page GENERAL: no distress  Lab Results  Component Value Date   HGBA1C 8.2* 09/22/2012      Assessment & Plan:  DM: This new insulin pump regimen was chosen from multiple options, as it best matches his insulin to his changing requirements throughout the day.  The benefits of glycemic control must be weighed against the risks of hypoglycemia.  Therapy is limited by noncompliance with cbg recording and insulin dosing.  i'll do the best i can.

## 2012-11-10 NOTE — Telephone Encounter (Signed)
rx sent into pharmacy

## 2012-11-15 ENCOUNTER — Encounter: Payer: 59 | Attending: Internal Medicine | Admitting: *Deleted

## 2012-11-15 DIAGNOSIS — E109 Type 1 diabetes mellitus without complications: Secondary | ICD-10-CM | POA: Insufficient documentation

## 2012-11-15 DIAGNOSIS — Z713 Dietary counseling and surveillance: Secondary | ICD-10-CM | POA: Insufficient documentation

## 2012-11-16 NOTE — Progress Notes (Signed)
  Pump Follow Up Progress Note  Reviewed blood glucose logs and CGM data on 11/15/2012 via: [facility] and found the following:         Comments: Patient states Dr. Everardo All increased basal rates to 2.50 u/hr x 24 hours the day (increase of TDD from basal from 45 to 60 units) after he started on CGM with excellent results and no hypoglycemia. Patient has resumed eating [employer] meals and is exercising at 6 PM after work for last 3 days with further improvement in BG and SG.  [facility] reports show improvement of average BG from 203 to 154 mg/dl in past week since he started on CGM! No changes to pump or CGM settings suggested today. Did remind patient of Temp Basal option if needed for exercise.  Also observed patient with change out of Enlite sensor which he accomplished without difficulty.  Pump Settings: Date: Current Date: 11/15/2012  No changes    Basal Rate: Carb Ratio Sensitivity  Basal Rate: Carb Ratio Sensitivity   MN: 2.50 MN:2.2 10 MN:     7 AM 2.50 11 AM: 2.7       12 N 2.50 5 PM: 2.2       6 PM 2.50        9:30 PM 2.50                           Plan:continue with pump and CGM, continue with more appropriate food intake and exercise    Follow up:  Patient to upload to [facility] as needed and follow up visit in 4 weeks.

## 2012-11-19 NOTE — Progress Notes (Signed)
CGM Training:  Appt start time: 1515 end time:1815.  Assessment:  Primary concerns today: Patient here for initiation of [employer] Continuous Glucose Monitoring on 530G pump and Enlite sensor  Medications: see list     Intervention:   Understanding Glucose Sensing Programming Sensor Information  Low Glucose Suspend: 60 mg/dl  High Glucose: 086 mg/dl  Low Glucose 80 mg/dl  Low Glucose Repeat: 20 minutes  High Glucose Repeat: 2.0 hours  Other settings to be added at follow up visit Starting [employer] of Product  Entering BG, Calibration Technique and Graphs with Public librarian and Alarms  Follow Up Patient to see MD for insulin dose adjustments and to schedule visit with me for CGM follow up within 1-2 week(s).

## 2012-12-07 ENCOUNTER — Encounter: Payer: Self-pay | Admitting: Endocrinology

## 2012-12-07 ENCOUNTER — Ambulatory Visit (INDEPENDENT_AMBULATORY_CARE_PROVIDER_SITE_OTHER): Payer: 59 | Admitting: Endocrinology

## 2012-12-07 VITALS — BP 130/76 | HR 80 | Ht 70.0 in | Wt 252.0 lb

## 2012-12-07 DIAGNOSIS — E119 Type 2 diabetes mellitus without complications: Secondary | ICD-10-CM

## 2012-12-07 DIAGNOSIS — E139 Other specified diabetes mellitus without complications: Secondary | ICD-10-CM

## 2012-12-07 MED ORDER — GLUCOSE BLOOD VI STRP: ORAL_STRIP | Status: DC

## 2012-12-07 NOTE — Progress Notes (Signed)
Subjective:    Patient ID: Zachary Graham, male    DOB: 12/16/1960, 52 y.o.   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 He has obtained the continuous glucose monitor.  He calibrates it with his glucose meter approx 6 times a day, and they frequently diverge by more than 40 mg%.  He has increased the daytime basal rate from 7 am-12 noon.  no cbg record, but states cbg's are pretty well-controlled.  He has mild hypoglycemia approx every 3 days.  This happens with exercise, or in the afternoon.  He sees dm education and [facility]link.   Past Medical History  Diagnosis Date  . Diabetes mellitus   . GERD (gastroesophageal reflux disease)   . Hypertension   . Hyperlipidemia   . OSA (obstructive sleep apnea)     Past Surgical History  Procedure Laterality Date  . Tonsillectomy      History   Social History  . Marital Status: Married    Spouse Name: N/A    Number of Children: 3  . Years of Education: N/A   Occupational History  . 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 Smoking status: Never Smoker   . Smokeless tobacco: Never Used  . Alcohol Use: No  . Drug Use: No  . Sexually Active: Yes   Other Topics Concern  . Not on file   Social History Narrative  . No narrative on file    Current Outpatient Prescriptions on File Prior to Visit  Medication Sig Dispense Refill  . ezetimibe (ZETIA) 10 MG tablet Take 1 tablet (10 mg total) by mouth daily.  90 tablet  3  . insulin aspart (NOVOLOG) 100 UNIT/ML injection For use via pump, total 100 units per day  10 vial  3  . Insulin Infusion Pump Supplies (PARADIGM RESERVOIR ) MISC 1 Device by Does not apply  route daily.      . INSULIN SYRINGE 1CC/29G 29G X 1/2" 1 ML MISC 1 Syringe by Does not apply route every 3 (three) days.  10 each  3  . losartan (COZAAR) 50 MG tablet Take 1 tablet (50 mg total) by mouth daily.  30 tablet  4  . pantoprazole (PROTONIX) 40 MG tablet TAKE 1 TABLET BY MOUTH ONCE DAILY  90 tablet  2  . simvastatin (ZOCOR) 40 MG tablet Take 40 mg by mouth daily.       No current facility-administered medications on file prior to visit.    Allergies  Allergen Reactions  . Lisinopril   . Penicillins   . Shellfish Allergy     Family History  Problem Relation Age of Onset  . Colon cancer Maternal Grandfather   . Colon cancer Paternal Grandfather   . Alcohol abuse Neg Hx   . Asthma Neg Hx   . Diabetes Neg Hx   . Heart disease Father     Valve replacement; pacemaker  . Hyperlipidemia Neg Hx   . Hypertension Neg Hx   . Kidney disease Neg Hx   . Stroke Neg Hx    BP 130/76  Pulse 80  Ht 5\' 10"  (1.778 m)  Wt 252 lb (114.306 kg)  BMI 36.16 kg/m2  SpO2 98%  Review of Systems Denies LOC and weight change    Objective:   Physical Exam VITAL SIGNS:  See vs page GENERAL: no distress     Assessment & Plan:  DM: This insulin pump regimen was chosen from multiple options, as it best matches his insulin to his changing requirements throughout the day.  The benefits of glycemic control must be weighed against the risks of hypoglycemia.  Therapy is limited by the fact that he receives medial advice from multiple sources.  He is advised today to stay with the prescribed pump regimen.  therapy is also limited by noncompliance with cbg recording.  i'll do the best i can.

## 2012-12-07 NOTE — Patient Instructions (Addendum)
check your blood sugar 6 times a day.   Please take a basal rate of 2.5 units/hr, 24 hrs a day.   continue mealtime bolus of 1 unit/ 2.5 grams carbohydrate.  However, subtract 15 units from your calculated lunch bolus.  continue correction bolus (which some people call "sensitivity," or "insulin sensitivity ratio," or just "isr") of 1 unit for each 10 by which your glucose exceeds 100. Please come back for a follow-up appointment in 3-4 weeks.  Please upload your continuous glucose monitor, and let me know when this is done.   Please contact the customer service about the continuous glucose monitor, to seek help for the poor correlation between the meter and continuous glucose monitor.

## 2012-12-18 ENCOUNTER — Encounter: Payer: 59 | Attending: Internal Medicine | Admitting: *Deleted

## 2012-12-18 ENCOUNTER — Telehealth: Payer: Self-pay | Admitting: Endocrinology

## 2012-12-18 DIAGNOSIS — E109 Type 1 diabetes mellitus without complications: Secondary | ICD-10-CM | POA: Insufficient documentation

## 2012-12-18 DIAGNOSIS — E139 Other specified diabetes mellitus without complications: Secondary | ICD-10-CM

## 2012-12-18 DIAGNOSIS — Z713 Dietary counseling and surveillance: Secondary | ICD-10-CM | POA: Insufficient documentation

## 2012-12-18 NOTE — Telephone Encounter (Signed)
Jammal came in today and wants to start seeing Dr Elvera Lennox is this ok to move him to her?

## 2012-12-18 NOTE — Telephone Encounter (Signed)
ok 

## 2013-01-03 ENCOUNTER — Ambulatory Visit: Payer: 59 | Admitting: Endocrinology

## 2013-01-08 NOTE — Progress Notes (Signed)
  Pump Follow Up Progress Note  Reviewed blood glucose logs on 12/18/2012 via: [facility] and found the following:            Hypoglycemia Hyperglycemia Comments  Overnight Period:      Pre-Meal:    Breakfast  YES BASAL   Lunch  YES BASAL   Supper     Post-Meal: Breakfast      Lunch      Supper     Bedtime:       Comments: Morning BG too high until lunch time, would suggest increasing his Basal Rate by 10% at 7 AM from 2.50 to 2.75 units per hour.  Pump Settings: Date: Current Date: changes are in bold print   Basal Rate: Carb Ratio Sensitivity  Basal Rate: Carb Ratio Sensitivity   MN: 2.50 MN: 2.2 10 MN: 2.50 MN: 2.2 10  7  AM 2.50 11 AM: 2.7  7 AM 2.75  (+) 11 AM: 2.7   12 PM 2.50 5 PM: 2.2  12 PM: 2.50 5 PM: 2.2                                        Plan: Discussed rationale for above changes and he chose to adjust his pump settings at this time. Patient to continue using his Bolus Wizard for meal and correction insulin doses and call me if any questions or concerns.  Follow up:  P[atient to upload to [facility] as needed for further review

## 2013-01-16 ENCOUNTER — Encounter: Payer: Self-pay | Admitting: Internal Medicine

## 2013-01-23 ENCOUNTER — Ambulatory Visit: Payer: 59 | Admitting: Internal Medicine

## 2013-01-30 ENCOUNTER — Ambulatory Visit: Payer: 59 | Admitting: Internal Medicine

## 2013-02-05 ENCOUNTER — Ambulatory Visit (INDEPENDENT_AMBULATORY_CARE_PROVIDER_SITE_OTHER): Payer: 59 | Admitting: Internal Medicine

## 2013-02-05 ENCOUNTER — Encounter: Payer: Self-pay | Admitting: Internal Medicine

## 2013-02-05 VITALS — BP 132/76 | HR 64 | Temp 98.3°F | Resp 12 | Ht 70.0 in | Wt 254.2 lb

## 2013-02-05 DIAGNOSIS — E119 Type 2 diabetes mellitus without complications: Secondary | ICD-10-CM

## 2013-02-05 DIAGNOSIS — E139 Other specified diabetes mellitus without complications: Secondary | ICD-10-CM

## 2013-02-05 LAB — MICROALBUMIN / CREATININE URINE RATIO
Creatinine,U: 101.6 mg/dL
Microalb Creat Ratio: 0.4 mg/g (ref 0.0–30.0)
Microalb, Ur: 0.4 mg/dL (ref 0.0–1.9)

## 2013-02-05 MED ORDER — GLUCAGON (RDNA) 1 MG IJ KIT: 1.0000 mg | PACK | Freq: Once | INTRAMUSCULAR | Status: DC | PRN

## 2013-02-05 NOTE — Progress Notes (Signed)
Patient ID: Zachary Graham, male   DOB: 1960-08-24, 52 y.o.   MRN: [id number]  HPI: Zachary Graham is a 52 y.o.-year-old male, referred by his PCP, Dr.Jones, for management of DM1, insulin-dependent, uncontrolled, with complications (mild sensory peripheral neuropathy; hypoglycemia episodes). He was previously seen by Dr. Everardo All, last visit on 12/07/2012 (Note reviewed).  Patient has been diagnosed with diabetes in 1993. He has a history of severe hypoglycemia episodes early this year, with mild hypoglycemia episodes every 3 days. Usually these have a with exercise or in PM. He never had DKA.  Last hemoglobin A1c was: Lab Results  Component Value Date   HGBA1C 8.2* 09/22/2012  previously, hemoglobin A1c has been 7.4, previously 6.8%.  Pt is on an insulin pump since 2006, currently on Paradigm 751 (3-4 mo old) + enlite CGM (started recently). Has Novolog in it. He noticed that frequently, he CGM readings, will diverge by more than 40% from his meter CBG reading.  Pump settings: - Basal rates - 63 units/day:  12 AM to 7 AM 2.5 7 AM to 12 PM 2.75 (increased by [facility] on 12/18/2012) 12 PM to 12 AM 2.5 - insulin to carb ratio:  12 AM to 11 AM: 2.2 11 AM to 5 PM: 2.7 5 PM to 12 AM: 2.2 - ISF: 10 - up to 120 units/day from bolusing - target: 100-120 - IOB: 4h - Bolus wizard: On - Modified bolusing: no - Changes the pump site: tries every 3 days  Pt checks his sugars 3-4x a day and 3-4 x a night - calibrates the CGM 6 x a day - am: 100-120 - 8-9 am: 200s - noon: 170-200s - before dinner: 150-160 - bedtime (12 am): 180s - 2 am: 230-300s  - 4 am: 350s Lowest sugar was 27, recently >50s; he has hypoglycemia awareness at 60. Highest sugar was 300s in last 6 mo.  Pt's meals are: - Breakfast: egg sandwich - Lunch: salad, soup, sandwich, fruit - Dinner: at 9-10 am >> pasta, chicken, salmon, Nutri system meals - no high-fat foods like pizza, [place], [place] - Snacks: He drinks 10-12  diet cans of soda a day!!!!   - no CKD, last BUN/creatinine:  Lab Results  Component Value Date   BUN 15 09/22/2012   CREATININE 1.1 09/22/2012  he is on Cozaar. - last set of lipids: Lab Results  Component Value Date   CHOL 214* 05/26/2012   HDL 43.40 05/26/2012   LDLDIRECT 132.1 05/26/2012   TRIG 178.0* 05/26/2012   CHOLHDL 5 05/26/2012  he is on Zocor. - last eye exam was in 08/2012 - at the [facility]. ? DR.  - + numbness and tingling in his feet.  I reviewed his chart and he also has a history of GERD, hypertension, hyperlipidemia, OSA. His BMI is 36. He is on Nutri system diet, and used to exercise 3 times a week (1h) at least, at 6 PM, after work. Last year he lost 50 lbs, gained 35 lbs back. Would like to get back into that routine.  His last TSH on 12/27/2012 was normal, at 2.63.  Pt has FH of DM - father.  ROS: Constitutional: +weight gain, + fatigue, no subjective hyperthermia/hypothermia, nocturia more than once Eyes: no blurry vision, no xerophthalmia ENT: no sore throat, no nodules palpated in throat, no dysphagia/odynophagia, no hoarseness Cardiovascular: no CP/SOB/palpitations/leg swelling Respiratory: no cough/SOB Gastrointestinal: no N/V/D/C Musculoskeletal: no muscle/joint aches Skin: no rashes Neurological: no tremors/numbness/tingling/dizziness Psychiatric: no depression/anxiety  Past Medical History  Diagnosis Date  . Diabetes mellitus   . GERD (gastroesophageal reflux disease)   . Hypertension   . Hyperlipidemia   . OSA (obstructive sleep apnea)     Past Surgical History  Procedure Laterality Date  . Tonsillectomy     History   Social History  . Marital Status: Married    Spouse Name: N/A    Number of Children: 3   Occupational History  . RADIATION SAFETY OFFICER [employer]   Social History Main Topics  . Smoking status: Never Smoker   . Smokeless tobacco: Never Used  . Alcohol Use: No  . Drug Use: No  . Sexual Activity: Yes    Partners:  Female   Social History Narrative   Regular exercise: not at this time   Caffeine use: daily; extensive (dt soda)   Current Outpatient Prescriptions on File Prior to Visit  Medication Sig Dispense Refill  . ezetimibe (ZETIA) 10 MG tablet Take 1 tablet (10 mg total) by mouth daily.  90 tablet  3  . glucose blood (ONE TOUCH ULTRA TEST) test strip 6 times a day, and lancets 250.01  180 each  12  . insulin aspart (NOVOLOG) 100 UNIT/ML injection For use via pump, total 100 units per day  10 vial  3  . Insulin Infusion Pump Supplies (PARADIGM RESERVOIR ) MISC 1 Device by Does not apply route daily.      . INSULIN SYRINGE 1CC/29G 29G X 1/2" 1 ML MISC 1 Syringe by Does not apply route every 3 (three) days.  10 each  3  . losartan (COZAAR) 50 MG tablet Take 1 tablet (50 mg total) by mouth daily.  30 tablet  4  . pantoprazole (PROTONIX) 40 MG tablet TAKE 1 TABLET BY MOUTH ONCE DAILY  90 tablet  2  . simvastatin (ZOCOR) 40 MG tablet Take 40 mg by mouth daily.       No current facility-administered medications on file prior to visit.   Allergies  Allergen Reactions  . Lisinopril   . Penicillins   . Shellfish Allergy    Family History  Problem Relation Age of Onset  . Colon cancer Maternal Grandfather   . Colon cancer Paternal Grandfather   . Alcohol abuse Neg Hx   . Asthma Neg Hx   . Diabetes Neg Hx   . Heart disease Father     Valve replacement; pacemaker  . Hyperlipidemia Neg Hx   . Hypertension Neg Hx   . Kidney disease Neg Hx   . Stroke Neg Hx    PE: BP 132/76  Pulse 64  Temp(Src) 98.3 F (36.8 C) (Oral)  Resp 12  Ht 5\' 10"  (1.778 m)  Wt 254 lb 3.2 oz (115.304 kg)  BMI 36.47 kg/m2  SpO2 98% Wt Readings from Last 3 Encounters:  02/05/13 254 lb 3.2 oz (115.304 kg)  12/07/12 252 lb (114.306 kg)  11/09/12 244 lb (110.678 kg)   Constitutional: overweight, in NAD Eyes: PERRLA, EOMI, no exophthalmos ENT: moist mucous membranes, no thyromegaly, no cervical  lymphadenopathy Cardiovascular: RRR, No MRG Respiratory: CTA B Gastrointestinal: abdomen soft, NT, ND, BS+ Musculoskeletal: no deformities, strength intact in all 4 Skin: moist, warm, no rashes Neurological: no tremor with outstretched hands, DTR normal in all 4  ASSESSMENT: 1. DM2, non-insulin-dependent, uncontrolled, without complications  PLAN:  1. Patient with long-standing type 1 diabetes, on an insulin pump. Patient did not right his sugars down, but we downloaded his pump, which shows that his sugars are  increased from 2 to 6 AM, and then from 8 to 12 PM, then from 8 to 11 PM. He is telling me that his sugars increase in the morning, despite not always having breakfast, which is a sign that he needs an increase in basal rate at that time. The fact that his sugars are distinctly elevated between 2 and 6 AM, would mean that he would also need an increase in his basal rate at that time. Since his sugars are also increasing after breakfast and dinner (not so much after lunch), I will so increase his insulin to carb ratio slightly with these 2 meals. I encouraged him to try to use the sensor as much as he can. I also gave him the following instructions: Please change the pump settings as follows (changes are in bold): Pump settings:  - Basal rates: 12 AM to 2 AM 2.5  2 AM to 8 AM: 2.75 8 AM to 12 PM: 3.0 12 PM to 12 AM 2.75  - insulin to carb ratio:  12 AM to 11 AM: 2.0  11 AM to 5 PM: 2.7  5 PM to 12 AM: 2.0 - ISF: 10  - target: 100-120  - Insulin on Board time: 4h  - given foot care handout and explained the principles  - given instructions for hypoglycemia management "15-15 rule"  - advised for yearly eye exams - sent glucagon Rx to his pharmacy - Return to clinic in one month with sugar log - continue to followup with Pincus Large in diabetes education  - we will check a HbA1C   [facility] Dr.: Dr. Roanna Raider - patient will let me know contact information so I can send her my  notes.  Office Visit on 02/05/2013  Component Date Value Range Status  . Hemoglobin A1C 02/05/2013 8.7* 4.6 - 6.5 % Final   Glycemic Control Guidelines for People with Diabetes:Non Diabetic:  <6%Goal of Therapy: <7%Additional Action Suggested:  >8%   . Sodium 02/05/2013 139  135 - 145 mEq/L Final  . Potassium 02/05/2013 3.9  3.5 - 5.1 mEq/L Final  . Chloride 02/05/2013 104  96 - 112 mEq/L Final  . CO2 02/05/2013 28  19 - 32 mEq/L Final  . Glucose, Bld 02/05/2013 107* 70 - 99 mg/dL Final  . BUN 40/98/1191 13  6 - 23 mg/dL Final  . Creatinine, Ser 02/05/2013 0.8  0.4 - 1.5 mg/dL Final  . Calcium 47/82/9562 9.1  8.4 - 10.5 mg/dL Final  . GFR 13/12/6576 102.87  >60.00 mL/min Final  . Microalb, Ur 02/05/2013 0.4  0.0 - 1.9 mg/dL Final  . Creatinine,U 46/96/2952 101.6   Final  . Microalb Creat Ratio 02/05/2013 0.4  0.0 - 30.0 mg/g Final   Msg sent; Dear Zachary Graham, HbA1c is a little higher than before - let's continue with the plan as discussed. You do not have proteins in your urine, which is great! Your Creatinine is now back to normal, at 0.8. Please let me know if you have any questions. Sincerely, Carlus Pavlov MD

## 2013-02-05 NOTE — Patient Instructions (Addendum)
Please change the pump settings as follows (changes are in bold): Pump settings:  - Basal rates: 12 AM to 2 AM 2.5  2 AM to 8 AM: 2.75 8 AM to 12 PM: 3.0 12 PM to 12 AM 2.75  - insulin to carb ratio:  12 AM to 11 AM: 2.0  11 AM to 5 PM: 2.7  5 PM to 12 AM: 2.0 - ISF: 10  - target: 100-120  - Insulin on Board: 4h   Please stop at the lab, write your name on the list, take a seat in the waiting room, and you will be called in for blood draw.  I will send you the results through MyChart.   Basic Rules for Patients with Type I Diabetes Mellitus  1. The American Diabetes Association (ADA) recommended targets: - fasting sugar <130 - after meal sugar <180 - HbA1C <7%  2. Engage in ?150 min moderate exercise per week  3. Make sure you have ?8h of sleep every night as this helps both blood sugars and your weight.  4. Always keep a sugar log (not only record in your meter) and bring it to all appointments with [place].  5. If you are on a pump, know how to access the settings and to modify the parameters.  6.  Remember, you can always call the number on the back of the pump for emergencies related to the pump.  7. "15-15 rule" for hypoglycemia: if sugars are low, take 15 g of carbs** ("fast sugar" - e.g. 4 glucose tablets, 4 oz orange juice), wait 15 min, then check sugars again. If still <80, repeat. Continue  until your sugars >80, then eat a normal meal.   8. Teach family members and coworkers to inject glucagon. Have a glucagon set at home and one at work. They should call 911 after using the set.  9. If you are on a pump, set "insulin on board" time for 5 hours (if your sugars tend to be higher, can use 4 hours).   10. If you are on a pump, use the "dual wave bolus" setting for high fat foods (e.g. pizza). Start with a setting of 50%-50% (50% instant bolus and 50% prolonged bolus over 3h, for e.g.).    11. If you are on a pump, make sure the basal daily insulin dose is  approximately equal (not larger) to the daily insulin you get from boluses, otherwise you are at risk for hypoglycemia.  12. Check sugar before driving. If <100, correct, and only start driving if sugars rise ?811. Check sugar every hour when on a long drive.  13. Check sugar before exercising. If <100, correct, and only start exercising if sugars rise ?100. Check sugar every hour when on a long exercise routine and 1h after you finished exercising.   If >250, check urine for ketones. If you have moderate-large ketones in urine, do not start exercise. Hydrate yourself with clear liquids and correct the high sugar. Recheck sugars and ketones before attempting to exercise.  Be aware that you might need less insulin when exercising.  *intense, short, exercise bursts can increase your sugars, but  *less intense, longer (>1h), exercise routines can decrease your sugars.  If you are on a pump, you might need to decrease your basal rate by 10% or more (or even disconnect your pump) while you exercise to prevent low sugars. Do not disconnect your pump by more than 3 hours at a time! You also might need  to decrease your insulin bolus for the meal prior to your exercise time by 20% or more.  14. Make sure you have a MedAlert bracelet or pendant mentioning "Type I Diabetes Mellitus". If you have a prior episode of severe hypoglycemia or hypoglycemia unawareness, it should also mention this.  15. Please do not walk barefoot. Inspect your feet for sores/cuts and let us know if you have them.  16. Please call [facility] with any questions and concerns [phone]).   **E.g. of "fast carbs":   first choice (15 g):  1 tube glucose gel, GlucoPouch 15, 2 oz glucose liquid   second choice (15-16 g):  3 or 4 glucose tablets (best taken  with water), 15 Dextrose Bits chewable   third choice (15-20 g):   cup fruit juice,  cup regular soda, 1 cup skim milk,  1 cup sports drink   fourth  choice (15-20 g):  1 small tube [place] gel (not frosting), 2 tbsp raisins, 1 tbsp table sugar,  candy, jelly beans, gum drops - check package for carb amount   (adapted from: Juluis Rainier. "Insulin therapy and hypoglycemia" Endocrinol Metab Clin N Am 2012, 41: 57-87)

## 2013-02-06 LAB — BASIC METABOLIC PANEL
CO2: 28 mEq/L (ref 19–32)
Chloride: 104 mEq/L (ref 96–112)
Creatinine, Ser: 0.8 mg/dL (ref 0.4–1.5)
GFR: 102.87 mL/min (ref >60.00)
Potassium: 3.9 mEq/L (ref 3.5–5.1)
Sodium: 139 mEq/L (ref 135–145)

## 2013-02-28 ENCOUNTER — Ambulatory Visit (INDEPENDENT_AMBULATORY_CARE_PROVIDER_SITE_OTHER): Payer: 59 | Admitting: Internal Medicine

## 2013-02-28 ENCOUNTER — Encounter: Payer: Self-pay | Admitting: Internal Medicine

## 2013-02-28 VITALS — BP 120/74 | HR 80 | Temp 97.5°F | Resp 16 | Ht 70.0 in | Wt 253.0 lb

## 2013-02-28 DIAGNOSIS — I379 Nonrheumatic pulmonary valve disorder, unspecified: Secondary | ICD-10-CM

## 2013-02-28 DIAGNOSIS — E559 Vitamin D deficiency, unspecified: Secondary | ICD-10-CM

## 2013-02-28 DIAGNOSIS — E7800 Pure hypercholesterolemia, unspecified: Secondary | ICD-10-CM

## 2013-02-28 DIAGNOSIS — I370 Nonrheumatic pulmonary valve stenosis: Secondary | ICD-10-CM

## 2013-02-28 DIAGNOSIS — E781 Pure hyperglyceridemia: Secondary | ICD-10-CM

## 2013-02-28 NOTE — Assessment & Plan Note (Signed)
He had labs done at the [place] recently and was told that his Vit D level is low I will check the level today

## 2013-02-28 NOTE — Progress Notes (Signed)
Subjective:    Patient ID: Zachary Graham, male    DOB: 1960/06/18, 52 y.o.   MRN: [id number]  Hyperlipidemia This is a chronic problem. The current episode started more than 1 year ago. The problem is controlled. Recent lipid tests were reviewed and are variable. Exacerbating diseases include diabetes and obesity. He has no history of chronic renal disease, hypothyroidism, liver disease or nephrotic syndrome. Factors aggravating his hyperlipidemia include fatty foods. Pertinent negatives include no chest pain, focal sensory loss, focal weakness, leg pain, myalgias or shortness of breath. Current antihyperlipidemic treatment includes statins and ezetimibe. The current treatment provides moderate improvement of lipids. Compliance problems include adherence to exercise, adherence to diet and medication side effects.       Review of Systems  Constitutional: Negative.  Negative for fever, chills, diaphoresis, appetite change and fatigue.  HENT: Negative.   Eyes: Negative.   Respiratory: Negative.  Negative for cough, choking, chest tightness, shortness of breath and stridor.   Cardiovascular: Negative.  Negative for chest pain, palpitations and leg swelling.  Gastrointestinal: Negative.  Negative for nausea, vomiting, abdominal pain, diarrhea and constipation.  Endocrine: Negative.   Genitourinary: Negative.   Musculoskeletal: Negative.  Negative for arthralgias, back pain, gait problem, joint swelling, myalgias, neck pain and neck stiffness.  Skin: Negative.   Allergic/Immunologic: Negative.   Neurological: Negative.  Negative for focal weakness, syncope, weakness, light-headedness and numbness.  Hematological: Negative.  Negative for adenopathy. Does not bruise/bleed easily.  Psychiatric/Behavioral: Negative.        Objective:   Physical Exam  Vitals reviewed. Constitutional: He is oriented to person, place, and time. He appears well-developed and well-nourished. No distress.  HENT:   Head: Normocephalic and atraumatic.  Mouth/Throat: Oropharynx is clear and moist. No oropharyngeal exudate.  Eyes: Conjunctivae are normal. Right eye exhibits no discharge. Left eye exhibits no discharge. No scleral icterus.  Neck: Normal range of motion. Neck supple. No JVD present. No tracheal deviation present. No thyromegaly present.  Cardiovascular: Normal rate, regular rhythm and intact distal pulses.  Exam reveals no gallop, no S3, no S4 and no friction rub.   Murmur heard.  Decrescendo systolic murmur is present with a grade of 1/6   No diastolic murmur is present  Pulses:      Carotid pulses are 1+ on the right side, and 1+ on the left side.      Radial pulses are 1+ on the right side, and 1+ on the left side.       Femoral pulses are 1+ on the right side, and 1+ on the left side.      Popliteal pulses are 1+ on the right side, and 1+ on the left side.       Dorsalis pedis pulses are 1+ on the right side, and 1+ on the left side.       Posterior tibial pulses are 1+ on the right side, and 1+ on the left side.  Pulmonary/Chest: Effort normal and breath sounds normal. No stridor. No respiratory distress. He has no wheezes. He has no rales. He exhibits no tenderness.  Abdominal: Soft. Bowel sounds are normal. He exhibits no distension and no mass. There is no tenderness. There is no rebound and no guarding.  Musculoskeletal: Normal range of motion. He exhibits edema (trace edema in BLE). He exhibits no tenderness.  Lymphadenopathy:    He has no cervical adenopathy.  Neurological: He is oriented to person, place, and time.  Skin: Skin is warm and dry. No  rash noted. He is not diaphoretic. No erythema. No pallor.  Psychiatric: He has a normal mood and affect. His behavior is normal. Judgment and thought content normal.     Lab Results  Component Value Date   WBC 8.3 09/22/2012   HGB 14.4 09/22/2012   HCT 41.7 09/22/2012   PLT 218.0 09/22/2012   GLUCOSE 107* 02/05/2013   CHOL 214*  05/26/2012   TRIG 178.0* 05/26/2012   HDL 43.40 05/26/2012   LDLDIRECT 132.1 05/26/2012   ALT 21 06/20/2012   AST 19 06/20/2012   NA 139 02/05/2013   K 3.9 02/05/2013   CL 104 02/05/2013   CREATININE 0.8 02/05/2013   BUN 13 02/05/2013   CO2 28 02/05/2013   TSH 1.222 06/21/2012   PSA 0.31 09/21/2011   HGBA1C 8.7* 02/05/2013   MICROALBUR 0.4 02/05/2013       Assessment & Plan:

## 2013-02-28 NOTE — Assessment & Plan Note (Signed)
I will recheck his FLP 

## 2013-02-28 NOTE — Assessment & Plan Note (Signed)
He has stopped taking zetia, I will check his FLP and will advise further

## 2013-02-28 NOTE — Assessment & Plan Note (Signed)
He has no s/s I will check his annual ECHO in Jan 2015

## 2013-02-28 NOTE — Patient Instructions (Signed)

## 2013-03-01 ENCOUNTER — Other Ambulatory Visit (INDEPENDENT_AMBULATORY_CARE_PROVIDER_SITE_OTHER): Payer: 59

## 2013-03-01 DIAGNOSIS — E7800 Pure hypercholesterolemia, unspecified: Secondary | ICD-10-CM

## 2013-03-01 DIAGNOSIS — E559 Vitamin D deficiency, unspecified: Secondary | ICD-10-CM

## 2013-03-01 LAB — LIPID PANEL
Cholesterol: 152 mg/dL (ref 0–200)
HDL: 39.3 mg/dL (ref >39.00)
Total CHOL/HDL Ratio: 4
Triglycerides: 160 mg/dL — ABNORMAL HIGH (ref 0.0–149.0)

## 2013-03-02 ENCOUNTER — Encounter: Payer: Self-pay | Admitting: Internal Medicine

## 2013-03-12 ENCOUNTER — Ambulatory Visit (INDEPENDENT_AMBULATORY_CARE_PROVIDER_SITE_OTHER): Payer: 59 | Admitting: Internal Medicine

## 2013-03-12 ENCOUNTER — Encounter: Payer: Self-pay | Admitting: Internal Medicine

## 2013-03-12 ENCOUNTER — Other Ambulatory Visit: Payer: Self-pay | Admitting: *Deleted

## 2013-03-12 VITALS — BP 124/80 | HR 81 | Temp 98.5°F | Resp 10 | Wt 254.0 lb

## 2013-03-12 DIAGNOSIS — E119 Type 2 diabetes mellitus without complications: Secondary | ICD-10-CM

## 2013-03-12 DIAGNOSIS — E139 Other specified diabetes mellitus without complications: Secondary | ICD-10-CM

## 2013-03-12 MED ORDER — INSULIN ASPART 100 UNIT/ML ~~LOC~~ SOLN: SUBCUTANEOUS | Status: DC

## 2013-03-12 NOTE — Patient Instructions (Addendum)
Please change the following settings: - Basal rates - units/day: 12 AM to 2 AM 2.6 >> 2.8  2 AM to 8 AM: 2.75 >> 3  8 AM to 12 PM: 3.0 >> 3.3 12 PM to 5 PM >> 2.8 5 PM to12 AM 2.75 >> 3.0 Please return in 1 month with your sugar log.

## 2013-03-12 NOTE — Progress Notes (Signed)
Patient ID: Zachary Graham, male   DOB: 11/11/1960, 52 y.o.   MRN: [id number]  HPI: Zachary Graham is a 52 y.o.-year-old male, returning for f/u for DM1, dx 1993, insulin-dependent, uncontrolled, with complications (mild sensory peripheral neuropathy; hypoglycemia episodes). Last visit 1 mo ago.  Last hemoglobin A1c was: Lab Results  Component Value Date   HGBA1C 8.7* 02/05/2013   HGBA1C 8.2* 09/22/2012   HGBA1C 7.4* 05/26/2012   Pt is on an insulin pump since 2006, currently on Paradigm 751 (3-4 mo old) + enlite CGM (started recently). Has Novolog in it. He noticed that frequently, he CGM readings, will diverge by more than 40% from his meter CBG reading.  Pump settings: - Basal rates - 63 units/day:  12 AM to 7 AM 2.5 7 AM to 12 PM 2.75 (increased by [facility] on 12/18/2012) 12 PM to 12 AM 2.5 - new Basal rates - 67 units/day: 12 AM to 2 AM 2.5  2 AM to 8 AM: 2.75 8 AM to 12 PM: 3.0 12 PM to 12 AM 2.75  - insulin to carb ratio:  12 AM to 11 AM: 2.2 11 AM to 5 PM: 2.7 5 PM to 12 AM: 2.2 - new insulin to carb ratio:  12 AM to 11 AM: 2.0  11 AM to 5 PM: 2.7  5 PM to 12 AM: 2.0 - ISF: 10 - target: 100-120 - IOB: 4h - Bolus wizard: On - Modified bolusing: no - Changes the pump site: every 3 days  Pt checks his sugars 3-4x a day and 3-4 x a night - calibrates the CGM 6 x a day - am: 100-120 >> 120-140 - 8-9 am: 200s >> 111-226 - noon: 170-200s >> 76-255 - before dinner: 150-160 >> 96-226 - bedtime (12 am): 180s >> 140-214 - 2 am: 230-300s >> 140-327 - 4 am: 350s >> 250s >> 144-360 Lowest sugar was 27, recently 76 = lowest; he has hypoglycemia awareness at 60. Highest sugar was 300s in last 6 mo. He has a history of severe hypoglycemia episodes early this year, with mild hypoglycemia episodes every 3 days. Usually these have a with exercise or in PM. He never had DKA.  At last visit he was telling me he drank 10-12 diet cans of soda a day >> doing better now, reduced this  -  no CKD, last BUN/creatinine:  Lab Results  Component Value Date   BUN 13 02/05/2013   CREATININE 0.8 02/05/2013  he is on Cozaar. - last set of lipids: Lab Results  Component Value Date   CHOL 152 03/01/2013   HDL 39.30 03/01/2013   LDLCALC 81 03/01/2013   LDLDIRECT 132.1 05/26/2012   TRIG 160.0* 03/01/2013   CHOLHDL 4 03/01/2013  he is on Zocor. - last eye exam was in 08/2012 - at the [facility]. ? DR.  - + numbness and tingling in his feet.  He also has a history of GERD, hypertension, hyperlipidemia, OSA.   He is on Nutri system diet, and used to exercise 3 times a week (1h) at least, at 6 PM, after work. Last year he lost 50 lbs, gained 35 lbs back. Would like to get back into that routine, but too busy now.  I reviewed pt's medications, allergies, PMH, social hx, family hx and no changes required, except as mentioned above.  ROS: Constitutional: +weight gain, + fatigue, no subjective hyperthermia/hypothermia, nocturia more than once Eyes: no blurry vision, no xerophthalmia ENT: no sore throat, no nodules palpated in  throat, no dysphagia/odynophagia, no hoarseness Cardiovascular: no CP/SOB/palpitations/leg swelling Respiratory: no cough/SOB Gastrointestinal: no N/V/D/C Musculoskeletal: no muscle/joint aches Skin: no rashes Neurological: no tremors/numbness/tingling/dizziness  PE: BP 124/80  Pulse 81  Temp(Src) 98.5 F (36.9 C) (Oral)  Resp 10  Wt 254 lb (115.214 kg)  BMI 36.45 kg/m2  SpO2 96% Wt Readings from Last 3 Encounters:  03/12/13 254 lb (115.214 kg)  02/28/13 253 lb (114.76 kg)  02/05/13 254 lb 3.2 oz (115.304 kg)   Constitutional: overweight, in NAD Eyes: PERRLA, EOMI, no exophthalmos ENT: moist mucous membranes, no thyromegaly, no cervical lymphadenopathy Cardiovascular: RRR, No MRG Respiratory: CTA B Gastrointestinal: abdomen soft, NT, ND, BS+ Musculoskeletal: no deformities, strength intact in all 4 Skin: moist, warm, no rashes Neurological: no tremor  with outstretched hands, DTR normal in all 4  ASSESSMENT: 1. DM2, insulin-dependent, uncontrolled, without complications  PLAN:  1. Patient with long-standing type 1 diabetes, on an insulin pump. Patient did not right his sugars down, but we downloaded his pump, which shows that his sugars are still increased from 2 to 6 AM, and then from 8 to 12 PM, then from 8 to 11 PM. He is telling me that his sugars increase after dinner as he eats and his bolus wizard does not let him bolus too much after this. Per his bolus history, he gets an average of ~169 units a day from bolusing (!), as compared to only ~ 67 units a day from basal rates! At last visit, since his sugars were increasing after breakfast and dinner (not so much after lunch), we increased his insulin to carb ratio slightly with these 2 meals. - at this visit, I advised him to: Please change the pump settings as follows (changes are in bold): Patient Instructions  Please change the following settings: - Basal rates - units/day: 12 AM to 2 AM 2.6 >> 2.8  2 AM to 8 AM: 2.75 >> 3  8 AM to 12 PM: 3.0 >> 3.3 12 PM to 5 PM >> 2.8 5 PM to12 AM 2.75 >> 3.0 - insulin to carb ratio:  12 AM to 11 AM: 2.0  11 AM to 5 PM: 2.7  5 PM to 12 AM: 2.0 - ISF: 10  - target: 100-120  - Insulin on Board time: 4h  - sent new insulin Rx to his pharmacy - Return to clinic in one month with sugar log  [facility] Dr.: Dr. Roanna Raider - patient will let me know contact information so I can send her my notes.

## 2013-04-23 ENCOUNTER — Ambulatory Visit: Payer: 59 | Admitting: Internal Medicine

## 2013-04-23 DIAGNOSIS — Z0289 Encounter for other administrative examinations: Secondary | ICD-10-CM

## 2013-05-23 ENCOUNTER — Other Ambulatory Visit: Payer: Self-pay | Admitting: Internal Medicine

## 2013-06-20 ENCOUNTER — Other Ambulatory Visit: Payer: Self-pay | Admitting: Internal Medicine

## 2013-06-27 ENCOUNTER — Ambulatory Visit: Payer: 59 | Admitting: Internal Medicine

## 2013-07-25 ENCOUNTER — Encounter: Payer: Self-pay | Admitting: Internal Medicine

## 2013-07-25 ENCOUNTER — Ambulatory Visit (INDEPENDENT_AMBULATORY_CARE_PROVIDER_SITE_OTHER): Payer: 59 | Admitting: Internal Medicine

## 2013-07-25 ENCOUNTER — Other Ambulatory Visit (INDEPENDENT_AMBULATORY_CARE_PROVIDER_SITE_OTHER): Payer: 59

## 2013-07-25 ENCOUNTER — Ambulatory Visit (INDEPENDENT_AMBULATORY_CARE_PROVIDER_SITE_OTHER)
Admission: RE | Admit: 2013-07-25 | Discharge: 2013-07-25 | Disposition: A | Discharge status: Home / Self Care | Payer: 59 | Source: Ambulatory Visit | Attending: Internal Medicine | Admitting: Internal Medicine

## 2013-07-25 VITALS — BP 110/78 | HR 87 | Temp 97.6°F | Resp 16 | Ht 70.0 in | Wt 253.6 lb

## 2013-07-25 DIAGNOSIS — E139 Other specified diabetes mellitus without complications: Secondary | ICD-10-CM

## 2013-07-25 DIAGNOSIS — J209 Acute bronchitis, unspecified: Secondary | ICD-10-CM | POA: Insufficient documentation

## 2013-07-25 DIAGNOSIS — E119 Type 2 diabetes mellitus without complications: Secondary | ICD-10-CM

## 2013-07-25 DIAGNOSIS — R059 Cough, unspecified: Secondary | ICD-10-CM

## 2013-07-25 DIAGNOSIS — R05 Cough: Secondary | ICD-10-CM

## 2013-07-25 LAB — BASIC METABOLIC PANEL
BUN: 13 mg/dL (ref 6–23)
CALCIUM: 9.4 mg/dL (ref 8.4–10.5)
CO2: 27 mEq/L (ref 19–32)
Chloride: 101 mEq/L (ref 96–112)
Creatinine, Ser: 0.7 mg/dL (ref 0.4–1.5)
GFR: 118.85 mL/min (ref >60.00)
Glucose, Bld: 101 mg/dL — ABNORMAL HIGH (ref 70–99)
Potassium: 3.8 mEq/L (ref 3.5–5.1)
SODIUM: 136 meq/L (ref 135–145)

## 2013-07-25 LAB — HEMOGLOBIN A1C: Hgb A1c MFr Bld: 9 % — ABNORMAL HIGH (ref 4.6–6.5)

## 2013-07-25 MED ORDER — MOXIFLOXACIN HCL 400 MG PO TABS: 400.0000 mg | ORAL_TABLET | Freq: Every day | ORAL | Status: DC

## 2013-07-25 NOTE — Assessment & Plan Note (Signed)
I will treat the infection with avelox

## 2013-07-25 NOTE — Progress Notes (Signed)
Pre visit review using our clinic review tool, if applicable. No additional management support is needed unless otherwise documented below in the visit note. 

## 2013-07-25 NOTE — Assessment & Plan Note (Signed)
His CXR is normal 

## 2013-07-25 NOTE — Progress Notes (Signed)
Subjective:    Patient ID: Zachary Graham, male    DOB: 1960/09/04, 53 y.o.   MRN: [id number]  Cough This is a recurrent problem. The current episode started more than 1 month ago. The problem has been gradually worsening. The cough is productive of purulent sputum. Pertinent negatives include no chest pain, chills, ear congestion, ear pain, fever, headaches, heartburn, hemoptysis, myalgias, nasal congestion, postnasal drip, rash, rhinorrhea, sore throat, shortness of breath, sweats, weight loss or wheezing. He has tried OTC cough suppressant for the symptoms. The treatment provided moderate relief. There is no history of asthma, bronchiectasis, bronchitis, COPD, emphysema, environmental allergies or pneumonia.      Review of Systems  Constitutional: Negative.  Negative for fever, chills, weight loss, diaphoresis, appetite change and fatigue.  HENT: Negative.  Negative for ear pain, postnasal drip, rhinorrhea and sore throat.   Eyes: Negative.   Respiratory: Positive for cough. Negative for apnea, hemoptysis, choking, chest tightness, shortness of breath, wheezing and stridor.   Cardiovascular: Negative.  Negative for chest pain, palpitations and leg swelling.  Gastrointestinal: Negative.  Negative for heartburn, nausea, vomiting, abdominal pain, diarrhea, constipation and blood in stool.  Endocrine: Negative.   Genitourinary: Negative.   Musculoskeletal: Negative.  Negative for myalgias.  Skin: Negative.  Negative for rash.  Allergic/Immunologic: Negative.  Negative for environmental allergies.  Neurological: Negative.  Negative for headaches.  Hematological: Negative.  Negative for adenopathy. Does not bruise/bleed easily.  Psychiatric/Behavioral: Negative.        Objective:   Physical Exam  Vitals reviewed. Constitutional: He is oriented to person, place, and time. He appears well-developed and well-nourished. No distress.  HENT:  Head: Normocephalic and atraumatic.  Mouth/Throat:  Oropharynx is clear and moist. No oropharyngeal exudate.  Eyes: Conjunctivae are normal. Right eye exhibits no discharge. Left eye exhibits no discharge. No scleral icterus.  Neck: Normal range of motion. Neck supple. No JVD present. No tracheal deviation present. No thyromegaly present.  Cardiovascular: Normal rate, regular rhythm, normal heart sounds and intact distal pulses.  Exam reveals no gallop and no friction rub.   No murmur heard. Pulmonary/Chest: Effort normal and breath sounds normal. No stridor. He has no wheezes. He has no rales. He exhibits no tenderness.  Abdominal: Soft. Bowel sounds are normal. He exhibits no distension and no mass. There is no tenderness. There is no rebound and no guarding.  Musculoskeletal: Normal range of motion. He exhibits no edema and no tenderness.  Lymphadenopathy:    He has no cervical adenopathy.  Neurological: He is oriented to person, place, and time.  Skin: Skin is warm and dry. No rash noted. He is not diaphoretic. No erythema. No pallor.  Psychiatric: He has a normal mood and affect. His behavior is normal. Judgment and thought content normal.     Lab Results  Component Value Date   WBC 8.3 09/22/2012   HGB 14.4 09/22/2012   HCT 41.7 09/22/2012   PLT 218.0 09/22/2012   GLUCOSE 107* 02/05/2013   CHOL 152 03/01/2013   TRIG 160.0* 03/01/2013   HDL 39.30 03/01/2013   LDLDIRECT 132.1 05/26/2012   LDLCALC 81 03/01/2013   ALT 21 06/20/2012   AST 19 06/20/2012   NA 139 02/05/2013   K 3.9 02/05/2013   CL 104 02/05/2013   CREATININE 0.8 02/05/2013   BUN 13 02/05/2013   CO2 28 02/05/2013   TSH 1.222 06/21/2012   PSA 0.31 09/21/2011   HGBA1C 8.7* 02/05/2013   MICROALBUR 0.4 02/05/2013  Assessment & Plan:

## 2013-07-25 NOTE — Assessment & Plan Note (Signed)
His A1C shows that his blood sugars are not well controlled He will follow up with endo

## 2013-07-25 NOTE — Patient Instructions (Signed)
Acute Bronchitis Bronchitis is inflammation of the airways that extend from the windpipe into the lungs (bronchi). The inflammation often causes mucus to develop. This leads to a cough, which is the most common symptom of bronchitis.  In acute bronchitis, the condition usually develops suddenly and goes away over time, usually in a couple weeks. Smoking, allergies, and asthma can make bronchitis worse. Repeated episodes of bronchitis may cause further lung problems.  CAUSES Acute bronchitis is most often caused by the same virus that causes a cold. The virus can spread from person to person (contagious).  SIGNS AND SYMPTOMS   Cough.   Fever.   Coughing up mucus.   Body aches.   Chest congestion.   Chills.   Shortness of breath.   Sore throat.  DIAGNOSIS  Acute bronchitis is usually diagnosed through a physical exam. Tests, such as chest X-rays, are sometimes done to rule out other conditions.  TREATMENT  Acute bronchitis usually goes away in a couple weeks. Often times, no medical treatment is necessary. Medicines are sometimes given for relief of fever or cough. Antibiotics are usually not needed but may be prescribed in certain situations. In some cases, an inhaler may be recommended to help reduce shortness of breath and control the cough. A cool mist vaporizer may also be used to help thin bronchial secretions and make it easier to clear the chest.  HOME CARE INSTRUCTIONS  Get plenty of rest.   Drink enough fluids to keep your urine clear or pale yellow (unless you have a medical condition that requires fluid restriction). Increasing fluids may help thin your secretions and will prevent dehydration.   Only take over-the-counter or prescription medicines as directed by your health care provider.   Avoid smoking and secondhand smoke. Exposure to cigarette smoke or irritating chemicals will make bronchitis worse. If you are a smoker, consider using nicotine gum or skin  patches to help control withdrawal symptoms. Quitting smoking will help your lungs heal faster.   Reduce the chances of another bout of acute bronchitis by washing your hands frequently, avoiding people with cold symptoms, and trying not to touch your hands to your mouth, nose, or eyes.   Follow up with your health care provider as directed.  SEEK MEDICAL CARE IF: Your symptoms do not improve after 1 week of treatment.  SEEK IMMEDIATE MEDICAL CARE IF:  You develop an increased fever or chills.   You have chest pain.   You have severe shortness of breath.  You have bloody sputum.   You develop dehydration.  You develop fainting.  You develop repeated vomiting.  You develop a severe headache. MAKE SURE YOU:   Understand these instructions.  Will watch your condition.  Will get help right away if you are not doing well or get worse. Document Released: 06/10/2004 Document Revised: 01/03/2013 Document Reviewed: 10/24/2012 [employer] Patient Information 2014 [employer].  

## 2013-07-26 ENCOUNTER — Encounter: Payer: Self-pay | Admitting: Internal Medicine

## 2013-08-16 ENCOUNTER — Ambulatory Visit: Payer: 59 | Admitting: Internal Medicine

## 2013-08-21 ENCOUNTER — Encounter: Payer: Self-pay | Admitting: Internal Medicine

## 2013-08-21 ENCOUNTER — Ambulatory Visit (INDEPENDENT_AMBULATORY_CARE_PROVIDER_SITE_OTHER): Payer: 59 | Admitting: Internal Medicine

## 2013-08-21 VITALS — BP 116/70 | HR 88 | Temp 97.4°F | Ht 70.0 in | Wt 256.0 lb

## 2013-08-21 DIAGNOSIS — E139 Other specified diabetes mellitus without complications: Secondary | ICD-10-CM

## 2013-08-21 DIAGNOSIS — E119 Type 2 diabetes mellitus without complications: Secondary | ICD-10-CM

## 2013-08-21 MED ORDER — ONETOUCH ULTRASOFT LANCETS MISC: Status: DC

## 2013-08-21 MED ORDER — INSULIN ASPART 100 UNIT/ML ~~LOC~~ SOLN: SUBCUTANEOUS | Status: DC

## 2013-08-21 MED ORDER — GLUCOSE BLOOD VI STRP: ORAL_STRIP | Status: DC

## 2013-08-21 NOTE — Patient Instructions (Addendum)
Pump settings: - Basal rates - TDD units/day :  12 AM to 2 AM 2.7  2 AM to 8 AM: 2.8 >> 3.3 8 AM to 12 PM: 3.1 >> 3.5 12 PM to 9:30 PM 2.6 >> 12 PM to 5 PM: 2.6 5 PM-12 AM: 2.6 >> 3.0 - insulin to carb ratio:  12 AM to 11 AM: 2.0  11 AM to 5 PM: 2.7  5 PM to 12 AM: 2.0 - ISF: 10 - target: 100-120 - IOB: 4h - Bolus wizard: On  Please come back for a follow-up appointment in 3 months

## 2013-08-21 NOTE — Progress Notes (Signed)
Patient ID: Zachary Graham, male   DOB: 1960-06-13, 53 y.o.   MRN: [id number]  HPI: Zachary Graham is a 53 y.o.-year-old male, returning for f/u for DM1, dx 1993, insulin-dependent, uncontrolled, with complications (mild sensory peripheral neuropathy; hypoglycemia episodes). Last visit 6 mo ago.  Last hemoglobin A1c was: Lab Results  Component Value Date   HGBA1C 9.0* 07/25/2013   HGBA1C 8.7* 02/05/2013   HGBA1C 8.2* 09/22/2012   Pt is on an insulin pump since 2006, currently on Paradigm 751 (3-4 mo old) + enlite CGM. Has Novolog in it.  He tried Metformin in the past >> did not help  Pump settings: - Basal rates - TDD units/day 65.8 units/day:  12 AM to 2 AM 2.6 >> 2.8  2 AM to 8 AM: 2.75 >> 3  8 AM to 12 PM: 3.0 >> 3.3 12 PM to 5 PM >> 2.8 5 PM to12 AM 2.75 >> 3.0 - insulin to carb ratio:  12 AM to 11 AM: 2.0  11 AM to 5 PM: 2.7  5 PM to 12 AM: 2.0 - ISF: 10 - target: 100-120 - TDD from boluses: 150-200 units/day - IOB: 4h - Bolus wizard: On - Modified bolusing: no - Changes the pump site: every 3 days  Pt checks his sugars 3-4x a day and 2-3 x a night - calibrates the CGM 6 x a day - am: 100-120 >> 120-140 >> 186-274 - 8-9 am: 200s >> 111-226 >> 188-313 - noon: 170-200s >> 76-255 >> 210, 217 - before dinner: 150-160 >> 96-226 >> 142-291 - bedtime (12 am): 180s >> 140-214 >> 147-288 - 2 am: 230-300s >> 140-327 >> 147-308 - 4 am: 350s >> 250s >> 144-360 >> 146-293 No lows Lowest sugar was 27, recently 100 = lowest; he has hypoglycemia awareness at 60. Highest sugar was 300s (did not correct or infusion problems) in last 6 mo.  He has a history of severe hypoglycemia episodes early this year, with mild hypoglycemia episodes every 3 days. Usually these have a with exercise or in PM. He never had DKA.  At last visit he was telling me he drank 10-12 diet cans of soda a day >> doing better now, reduced this  - no CKD, last BUN/creatinine:  Lab Results  Component Value Date   BUN 13  07/25/2013   CREATININE 0.7 07/25/2013  he is on Cozaar. - last set of lipids: Lab Results  Component Value Date   CHOL 152 03/01/2013   HDL 39.30 03/01/2013   LDLCALC 81 03/01/2013   LDLDIRECT 132.1 05/26/2012   TRIG 160.0* 03/01/2013   CHOLHDL 4 03/01/2013  he is on Zocor. - last eye exam was in 08/2012 - at the [facility]. ? DR.  - + numbness and tingling in his feet.  He also has a history of GERD, hypertension, hyperlipidemia, OSA.   He is on Nutri system diet.   I reviewed pt's medications, allergies, PMH, social hx, family hx and no changes required, except as mentioned above.  ROS: Constitutional: no weight gain, no fatigue, no subjective hyperthermia/hypothermia, nocturia more than once Eyes: no blurry vision, no xerophthalmia ENT: no sore throat, no nodules palpated in throat, no dysphagia/odynophagia, no hoarseness Cardiovascular: no CP/SOB/palpitations/leg swelling Respiratory: no cough/SOB Gastrointestinal: no N/V/D/C Musculoskeletal: no muscle/joint aches Skin: no rashes Neurological: no tremors/numbness/tingling/dizziness  PE: BP 116/70  Pulse 88  Temp(Src) 97.4 F (36.3 C) (Oral)  Ht 5\' 10"  (1.778 m)  Wt 256 lb (116.121 kg)  BMI 36.73 kg/m2  SpO2 97% Wt Readings from Last 3 Encounters:  08/21/13 256 lb (116.121 kg)  07/25/13 253 lb 9.6 oz (115.032 kg)  03/12/13 254 lb (115.214 kg)   Constitutional: obese, in NAD Eyes: PERRLA, EOMI, no exophthalmos ENT: moist mucous membranes, no thyromegaly, no cervical lymphadenopathy Cardiovascular: RRR, No MRG Respiratory: CTA B Gastrointestinal: abdomen soft, NT, ND, BS+ Musculoskeletal: no deformities, strength intact in all 4 Skin: moist, warm, no rashes  ASSESSMENT: 1. DM2, insulin-dependent, uncontrolled, without complications  PLAN:  1. Patient with long-standing type 1 diabetes, on an insulin pump. He boluses an average of ~175 units a day (!), as compared to only 66 units a day from basal rates! He is  insulin resistant and he knows it will ultimately take a great effort to change his det and exercise pattern to improve his insulin resistance. We briefly discussed about using a U500 insulin in the pump, which is something that he would like to avoid. We discussed about Metformin and he did try this in the past but it did not make much of a difference in his sugars. - since his basal - bolus total daily doses are so discrepant, I advised him to change his basal rates as follows: Patient Instructions  Pump settings: - Basal rates - TDD units/day :  12 AM to 2 AM 2.7  2 AM to 8 AM: 2.8 >> 3.3 8 AM to 12 PM: 3.1 >> 3.5 12 PM to 9:30 PM 2.6 >> 12 PM to 5 PM: 2.6 5 PM-12 AM: 2.6 >> 3.0 - insulin to carb ratio:  12 AM to 11 AM: 2.0  11 AM to 5 PM: 2.7  5 PM to 12 AM: 2.0 - ISF: 10 - target: 100-120 - IOB: 4h - Bolus wizard: On  Please come back for a follow-up appointment in 3 months  - refilled insulin, strips, needles, lancets  [facility].: Dr. Meredith Pel

## 2013-09-20 LAB — HM DIABETES EYE EXAM

## 2013-10-24 ENCOUNTER — Encounter: Payer: Self-pay | Admitting: Internal Medicine

## 2013-10-24 ENCOUNTER — Ambulatory Visit (INDEPENDENT_AMBULATORY_CARE_PROVIDER_SITE_OTHER): Payer: 59 | Admitting: Internal Medicine

## 2013-10-24 VITALS — BP 110/72 | HR 85 | Temp 98.1°F | Resp 16 | Ht 70.0 in | Wt 262.0 lb

## 2013-10-24 DIAGNOSIS — F988 Other specified behavioral and emotional disorders with onset usually occurring in childhood and adolescence: Secondary | ICD-10-CM

## 2013-10-24 DIAGNOSIS — F900 Attention-deficit hyperactivity disorder, predominantly inattentive type: Secondary | ICD-10-CM

## 2013-10-24 DIAGNOSIS — E119 Type 2 diabetes mellitus without complications: Secondary | ICD-10-CM

## 2013-10-24 DIAGNOSIS — E139 Other specified diabetes mellitus without complications: Secondary | ICD-10-CM

## 2013-10-24 LAB — HM DIABETES FOOT EXAM

## 2013-10-24 MED ORDER — AMPHETAMINE-DEXTROAMPHET ER 20 MG PO CP24: 20.0000 mg | ORAL_CAPSULE | Freq: Every day | ORAL | Status: DC

## 2013-10-24 NOTE — Progress Notes (Signed)
Subjective:    Patient ID: Zachary Graham, male    DOB: Oct 14, 1960, 53 y.o.   MRN: [id number]  HPI Comments: He thinks he has ADHD - see ASRS questions.     Review of Systems  Constitutional: Negative.  Negative for fever, chills, diaphoresis, appetite change and fatigue.  HENT: Negative.   Eyes: Negative.   Respiratory: Negative.  Negative for cough, choking, chest tightness, shortness of breath and stridor.   Cardiovascular: Negative.  Negative for chest pain, palpitations and leg swelling.  Gastrointestinal: Negative.  Negative for nausea, vomiting, abdominal pain, diarrhea, constipation and blood in stool.  Endocrine: Negative.   Genitourinary: Negative.   Musculoskeletal: Negative.  Negative for back pain, gait problem, joint swelling, myalgias and neck pain.  Skin: Negative.  Negative for rash.  Allergic/Immunologic: Negative.   Neurological: Negative.  Negative for dizziness, light-headedness and headaches.  Hematological: Negative.  Negative for adenopathy. Does not bruise/bleed easily.  Psychiatric/Behavioral: Positive for decreased concentration. Negative for suicidal ideas, hallucinations, behavioral problems, confusion, sleep disturbance, self-injury, dysphoric mood and agitation. The patient is not nervous/anxious and is not hyperactive.        Objective:   Physical Exam  Vitals reviewed. Constitutional: He is oriented to person, place, and time. He appears well-developed and well-nourished. No distress.  HENT:  Head: Normocephalic and atraumatic.  Mouth/Throat: Oropharynx is clear and moist. No oropharyngeal exudate.  Eyes: Conjunctivae are normal. Right eye exhibits no discharge. Left eye exhibits no discharge. No scleral icterus.  Neck: Normal range of motion. Neck supple. No JVD present. No tracheal deviation present. No thyromegaly present.  Cardiovascular: Normal rate, regular rhythm, normal heart sounds and intact distal pulses.  Exam reveals no gallop and no  friction rub.   No murmur heard. Pulmonary/Chest: Effort normal and breath sounds normal. No stridor. No respiratory distress. He has no wheezes. He has no rales. He exhibits no tenderness.  Abdominal: Soft. Bowel sounds are normal. He exhibits no distension and no mass. There is no tenderness. There is no rebound and no guarding.  Musculoskeletal: Normal range of motion. He exhibits no edema and no tenderness.  Lymphadenopathy:    He has no cervical adenopathy.  Neurological: He is oriented to person, place, and time.  Skin: Skin is warm and dry. No rash noted. He is not diaphoretic. No erythema. No pallor.  Psychiatric: He has a normal mood and affect. His behavior is normal. Judgment and thought content normal. His mood appears not anxious. His affect is not angry, not blunt, not labile and not inappropriate. His speech is not rapid and/or pressured and not delayed. He is not slowed and not withdrawn. Cognition and memory are normal. Cognition and memory are not impaired. He does not exhibit a depressed mood. He expresses no homicidal and no suicidal ideation. He expresses no suicidal plans and no homicidal plans. He exhibits normal recent memory and normal remote memory. He is attentive.     Lab Results  Component Value Date   WBC 8.3 09/22/2012   HGB 14.4 09/22/2012   HCT 41.7 09/22/2012   PLT 218.0 09/22/2012   GLUCOSE 101* 07/25/2013   CHOL 152 03/01/2013   TRIG 160.0* 03/01/2013   HDL 39.30 03/01/2013   LDLDIRECT 132.1 05/26/2012   LDLCALC 81 03/01/2013   ALT 21 06/20/2012   AST 19 06/20/2012   NA 136 07/25/2013   K 3.8 07/25/2013   CL 101 07/25/2013   CREATININE 0.7 07/25/2013   BUN 13 07/25/2013   CO2  27 07/25/2013   TSH 1.222 06/21/2012   PSA 0.31 09/21/2011   HGBA1C 9.0* 07/25/2013   MICROALBUR 0.4 02/05/2013       Assessment & Plan:

## 2013-10-24 NOTE — Progress Notes (Signed)
Pre visit review using our clinic review tool, if applicable. No additional management support is needed unless otherwise documented below in the visit note. 

## 2013-10-24 NOTE — Patient Instructions (Signed)
Attention Deficit Hyperactivity Disorder Attention deficit hyperactivity disorder (ADHD) is a problem with behavior issues based on the way the brain functions (neurobehavioral disorder). It is a common reason for behavior and academic problems in school. SYMPTOMS  There are 3 types of ADHD. The 3 types and some of the symptoms include:  Inattentive  Gets bored or distracted easily.  Loses or forgets things. Forgets to hand in homework.  Has trouble organizing or completing tasks.  Difficulty staying on task.  An inability to organize daily tasks and school work.  Leaving projects, chores, or homework unfinished.  Trouble paying attention or responding to details. Careless mistakes.  Difficulty following directions. Often seems like is not listening.  Dislikes activities that require sustained attention (like chores or homework).  Hyperactive-impulsive  Feels like it is impossible to sit still or stay in a seat. Fidgeting with hands and feet.  Trouble waiting turn.  Talking too much or out of turn. Interruptive.  Speaks or acts impulsively.  Aggressive, disruptive behavior.  Constantly busy or on the go, noisy.  Often leaves seat when they are expected to remain seated.  Often runs or climbs where it is not appropriate, or feels very restless.  Combined  Has symptoms of both of the above. Often children with ADHD feel discouraged about themselves and with school. They often perform well below their abilities in school. As children get older, the excess motor activities can calm down, but the problems with paying attention and staying organized persist. Most children do not outgrow ADHD but with good treatment can learn to cope with the symptoms. DIAGNOSIS  When ADHD is suspected, the diagnosis should be made by professionals trained in ADHD. This professional will collect information about the individual suspected of having ADHD. Information must be collected from  various settings where the person lives, works, or attends school.  Diagnosis will include:  Confirming symptoms began in childhood.  Ruling out other reasons for the child's behavior.  The health care providers will check with the child's school and check their medical records.  They will talk to teachers and parents.  Behavior rating scales for the child will be filled out by those dealing with the child on a daily basis. A diagnosis is made only after all information has been considered. TREATMENT  Treatment usually includes behavioral treatment, tutoring or extra support in school, and stimulant medicines. Because of the way a person's brain works with ADHD, these medicines decrease impulsivity and hyperactivity and increase attention. This is different than how they would work in a person who does not have ADHD. Other medicines used include antidepressants and certain blood pressure medicines. Most experts agree that treatment for ADHD should address all aspects of the person's functioning. Along with medicines, treatment should include structured classroom management at school. Parents should reward good behavior, provide constant discipline, and limit-setting. Tutoring should be available for the child as needed. ADHD is a life-long condition. If untreated, the disorder can have long-term serious effects into adolescence and adulthood. HOME CARE INSTRUCTIONS   Often with ADHD there is a lot of frustration among family members dealing with the condition. Blame and anger are also feelings that are common. In many cases, because the problem affects the family as a whole, the entire family may need help. A therapist can help the family find better ways to handle the disruptive behaviors of the person with ADHD and promote change. If the person with ADHD is young, most of the therapist's work   is with the parents. Parents will learn techniques for coping with and improving their child's  behavior. Sometimes only the child with the ADHD needs counseling. Your health care providers can help you make these decisions.  Children with ADHD may need help learning how to organize. Some helpful tips include:  Keep routines the same every day from wake-up time to bedtime. Schedule all activities, including homework and playtime. Keep the schedule in a place where the person with ADHD will often see it. Imer schedule changes as far in advance as possible.  Schedule outdoor and indoor recreation.  Have a place for everything and keep everything in its place. This includes clothing, backpacks, and school supplies.  Encourage writing down assignments and bringing home needed books. Work with your child's teachers for assistance in organizing school work.  Offer your child a well-balanced diet. Breakfast that includes a balance of whole grains, protein and, fruits or vegetables is especially important for school performance. Children should avoid drinks with caffeine including:  Soft drinks.  Coffee.  Tea.  However, some older children (adolescents) may find these drinks helpful in improving their attention. Because it can also be common for adolescents with ADHD to become addicted to caffeine, talk with your health care provider about what is a safe amount of caffeine intake for your child.  Children with ADHD need consistent rules that they can understand and follow. If rules are followed, give small rewards. Children with ADHD often receive, and expect, criticism. Look for good behavior and praise it. Set realistic goals. Give clear instructions. Look for activities that can foster success and self-esteem. Make time for pleasant activities with your child. Give lots of affection.  Parents are their children's greatest advocates. Learn as much as possible about ADHD. This helps you become a stronger and better advocate for your child. It also helps you educate your child's teachers and  instructors if they feel inadequate in these areas. Parent support groups are often helpful. A national group with local chapters is called Children and Adults with Attention Deficit Hyperactivity Disorder (CHADD). SEEK MEDICAL CARE IF:  Your child has repeated muscle twitches, cough or speech outbursts.  Your child has sleep problems.  Your child has a marked loss of appetite.  Your child develops depression.  Your child has new or worsening behavioral problems.  Your child develops dizziness.  Your child has a racing heart.  Your child has stomach pains.  Your child develops headaches. SEEK IMMEDIATE MEDICAL CARE IF:  Your child has been diagnosed with depression or anxiety and the symptoms seem to be getting worse.  Your child has been depressed and suddenly appears to have increased energy or motivation.  You are worried that your child is having a bad reaction to a medication he or she is taking for ADHD. Document Released: 04/23/2002 Document Revised: 02/21/2013 Document Reviewed: 01/08/2013 [employer] Patient Information 2014 [employer].  

## 2013-10-25 ENCOUNTER — Encounter: Payer: Self-pay | Admitting: Internal Medicine

## 2013-10-25 NOTE — Assessment & Plan Note (Signed)
He does not want to see a therapist about this Will try Adderall XR and see if that helps

## 2013-11-20 ENCOUNTER — Ambulatory Visit (INDEPENDENT_AMBULATORY_CARE_PROVIDER_SITE_OTHER): Payer: 59 | Admitting: Internal Medicine

## 2013-11-20 ENCOUNTER — Encounter: Payer: Self-pay | Admitting: Internal Medicine

## 2013-11-20 ENCOUNTER — Other Ambulatory Visit: Payer: Self-pay | Admitting: *Deleted

## 2013-11-20 VITALS — BP 122/68 | HR 85 | Temp 98.3°F | Resp 12 | Wt 244.0 lb

## 2013-11-20 DIAGNOSIS — E119 Type 2 diabetes mellitus without complications: Secondary | ICD-10-CM

## 2013-11-20 DIAGNOSIS — E139 Other specified diabetes mellitus without complications: Secondary | ICD-10-CM

## 2013-11-20 DIAGNOSIS — R002 Palpitations: Secondary | ICD-10-CM

## 2013-11-20 LAB — HEMOGLOBIN A1C: Hgb A1c MFr Bld: 8.1 % — ABNORMAL HIGH (ref 4.6–6.5)

## 2013-11-20 MED ORDER — GLUCOSE BLOOD VI STRP: ORAL_STRIP | Status: DC

## 2013-11-20 NOTE — Progress Notes (Signed)
Patient ID: Zachary Graham, male   DOB: 1960/08/12, 53 y.o.   MRN: [id number]  HPI: Zachary Graham is a 53 y.o.-year-old male, returning for f/u for DM1, dx 1993, insulin-dependent, uncontrolled, with complications (mild sensory peripheral neuropathy; hypoglycemia episodes). Last visit 3 mo ago.  He started on Aderrall and started the Nutrisystem diet (no pizza, no fried foods, no snacks). He lost 18 lbs since last month!!! He has some palpitations but not bothersome.   Last hemoglobin A1c was: Lab Results  Component Value Date   HGBA1C 9.0* 07/25/2013   HGBA1C 8.7* 02/05/2013   HGBA1C 8.2* 09/22/2012   Pt is on an insulin pump since 2006, currently on Paradigm 751 (3-4 mo old) + enlite CGM. Has Novolog in it.  He tried Metformin in the past >> did not help  Pump settings: - Basal rates - 56% 12 AM to 2 AM 2.7 >> 2.3 2 AM to 8 AM: 2.8 >> 3.3 >> 3.0 8 AM to 12 PM: 3.1 >> 3.5 >> 3.4 12 PM to 5 PM: 2.6 >> 2.5 5 PM-12 AM: 2.6 >> 3.0 >> 2.9 - bolus 44%: insulin to carb ratio:  12 AM to 11 AM: 2.0  11 AM to 5 PM: 2.7  5 PM to 12 AM: 2.0 - ISF: 10 - target: 100-120 - IOB: 4h - Bolus wizard: On- Modified bolusing: no - Changes the pump site: every 3 days  Pt checks his sugars 4-6 a day and 2-3 x a night - calibrates the CGM 6 x a day >> sugars much better. Especially in last week: - am: 100-120 >> 120-140 >> 186-274 >> 58-200 - 8-9 am: 200s >> 111-226 >> 188-313 >> 83-168 - noon: 170-200s >> 76-255 >> 210, 217 >> 95-263 (369x1) - before dinner: 150-160 >> 96-226 >> 142-291 >> 70-195 - bedtime (12 am): 180s >> 140-214 >> 147-288 >> 65-204 (316 x1) - 2 am: 230-300s >> 140-327 >> 147-308 >> 69-300 - 4 am: 350s >> 250s >> 144-360 >> 146-293 >> 149-235 He had lows, especially before reducing his basal rates; recently 58 was the lowest; he has hypoglycemia awareness at 60. Highest sugar was 371.  He has a history of severe hypoglycemia episodes early this year. Usually these happen with exercise or  in PM. He never had DKA.  He was drinking 10-12 diet cans of soda a day >> now rarely drinks them.  - no CKD, last BUN/creatinine:  Lab Results  Component Value Date   BUN 13 07/25/2013   CREATININE 0.7 07/25/2013  he is on Cozaar. - last set of lipids: Lab Results  Component Value Date   CHOL 152 03/01/2013   HDL 39.30 03/01/2013   LDLCALC 81 03/01/2013   LDLDIRECT 132.1 05/26/2012   TRIG 160.0* 03/01/2013   CHOLHDL 4 03/01/2013  he is on Zocor. - last eye exam was in 08/2012 - at the [facility]. ? DR.  - + numbness and tingling in his feet.  He also has a history of GERD, hypertension, hyperlipidemia, OSA.   I reviewed pt's medications, allergies, PMH, social hx, family hx and no changes required, except as mentioned above.  ROS: Constitutional: + weight loss, no fatigue, no subjective hyperthermia/hypothermia, nocturia more than once Eyes: no blurry vision, no xerophthalmia ENT: no sore throat, no nodules palpated in throat, no dysphagia/odynophagia, no hoarseness Cardiovascular: no CP/SOB/+ palpitations/no leg swelling Respiratory: no cough/SOB Gastrointestinal: no N/V/D/C Musculoskeletal: no muscle/joint aches Skin: no rashes Neurological: no tremors/numbness/tingling/dizziness  PE: BP 122/68  Pulse 85  Temp(Src) 98.3 F (36.8 C) (Oral)  Resp 12  Wt 244 lb (110.678 kg)  SpO2 96% Wt Readings from Last 3 Encounters:  11/20/13 244 lb (110.678 kg)  10/24/13 262 lb (118.842 kg)  08/21/13 256 lb (116.121 kg)   Constitutional: obese, in NAD Eyes: PERRLA, EOMI, no exophthalmos ENT: moist mucous membranes, no thyromegaly, no cervical lymphadenopathy Cardiovascular: RRR, No MRG Respiratory: CTA B Gastrointestinal: abdomen soft, NT, ND, BS+ Musculoskeletal: no deformities, strength intact in all 4 Skin: moist, warm, no rashes  ASSESSMENT: 1. DM2, insulin-dependent, uncontrolled, without complications  2. Palpitations  PLAN:  1. Patient with long-standing type 1  diabetes, on an insulin pump. His sugars are much better after started Adderall and he lost weight! - since his sugars continue to improve, will continue the same insulin settings: Patient Instructions  Continue the current pump settings: - Basal rates  12 AM to 2 AM: 2.3 2 AM to 8 AM: 3.0 8 AM to 12 PM: 3.4 12 PM to 5 PM: 2.5 5 PM-12 AM: 2.9 insulin to carb ratio:  12 AM to 11 AM: 2.0  11 AM to 5 PM: 2.7  5 PM to 12 AM: 2.0 - ISF: 10 - target: 100-120 - IOB: 4h Please come back for a follow-up appointment in 3 months Please stop at the lab.  - will check HbA1c today - Return in about 3 months (around 02/20/2014).  2. Palpitations - most likely from Adderall, but will check a TSH, free t4  [facility] Dr. Meredith Pel   Office Visit on 11/20/2013  Component Date Value Ref Range Status  . TSH 11/20/2013 0.65  0.35 - 4.50 uIU/mL Final  . Free T4 11/20/2013 1.01  0.60 - 1.60 ng/dL Final  . T3, Free 11/20/2013 2.8  2.3 - 4.2 pg/mL Final  . Hemoglobin A1C 11/20/2013 8.1* 4.6 - 6.5 % Final   Glycemic Control Guidelines for People with Diabetes:Non Diabetic:  <6%Goal of Therapy: <7%Additional Action Suggested:  >8%    Msg sent: Dear Zachary Graham, HbA1c is decreasing! Thyroid tests are normal. Sincerely, Philemon Kingdom MD

## 2013-11-20 NOTE — Patient Instructions (Signed)
Continue the current pump settings: - Basal rates  12 AM to 2 AM: 2.3 2 AM to 8 AM: 3.0 8 AM to 12 PM: 3.4 12 PM to 5 PM: 2.5 5 PM-12 AM: 2.9 insulin to carb ratio:  12 AM to 11 AM: 2.0  11 AM to 5 PM: 2.7  5 PM to 12 AM: 2.0 - ISF: 10 - target: 100-120 - IOB: 4h  Please come back for a follow-up appointment in 3 months  Please stop at the lab.

## 2013-11-21 ENCOUNTER — Telehealth: Payer: Self-pay

## 2013-11-21 LAB — T4, FREE: FREE T4: 1.01 ng/dL (ref 0.60–1.60)

## 2013-11-21 LAB — TSH: TSH: 0.65 u[IU]/mL (ref 0.35–4.50)

## 2013-11-21 LAB — T3, FREE: T3 FREE: 2.8 pg/mL (ref 2.3–4.2)

## 2013-12-26 ENCOUNTER — Telehealth: Payer: Self-pay | Admitting: *Deleted

## 2013-12-26 ENCOUNTER — Telehealth: Payer: Self-pay | Admitting: Internal Medicine

## 2013-12-26 DIAGNOSIS — F900 Attention-deficit hyperactivity disorder, predominantly inattentive type: Secondary | ICD-10-CM

## 2013-12-26 MED ORDER — AMPHETAMINE-DEXTROAMPHET ER 20 MG PO CP24: 20.0000 mg | ORAL_CAPSULE | Freq: Every day | ORAL | Status: DC

## 2013-12-26 NOTE — Telephone Encounter (Signed)
done

## 2013-12-26 NOTE — Telephone Encounter (Signed)
Left msg on triage stating he has lost his rx for adderral wanting md to rx another one...Zachary Graham

## 2013-12-26 NOTE — Telephone Encounter (Signed)
Called pt [facility] (cell) rx ready for pick-up...Zachary Graham

## 2013-12-26 NOTE — Telephone Encounter (Signed)
Pt came by to request Rx refill for ADDERALL medication. Pt states he is completely out at this time but has an appt scheduled with Dr Ronnald Ramp at 1:15pm. Pt would like the Rx to be completed by this time.

## 2013-12-27 ENCOUNTER — Ambulatory Visit (INDEPENDENT_AMBULATORY_CARE_PROVIDER_SITE_OTHER): Payer: 59 | Admitting: Internal Medicine

## 2013-12-27 ENCOUNTER — Encounter: Payer: Self-pay | Admitting: Internal Medicine

## 2013-12-27 ENCOUNTER — Other Ambulatory Visit (INDEPENDENT_AMBULATORY_CARE_PROVIDER_SITE_OTHER): Payer: 59

## 2013-12-27 VITALS — BP 118/80 | HR 77 | Temp 98.2°F | Resp 16 | Ht 70.0 in | Wt 240.0 lb

## 2013-12-27 DIAGNOSIS — E139 Other specified diabetes mellitus without complications: Secondary | ICD-10-CM

## 2013-12-27 DIAGNOSIS — Z0000 Encounter for general adult medical examination without abnormal findings: Secondary | ICD-10-CM

## 2013-12-27 DIAGNOSIS — F900 Attention-deficit hyperactivity disorder, predominantly inattentive type: Secondary | ICD-10-CM

## 2013-12-27 LAB — COMPREHENSIVE METABOLIC PANEL
ALBUMIN: 3.7 g/dL (ref 3.5–5.2)
ALK PHOS: 89 U/L (ref 39–117)
ALT: 34 U/L (ref 0–53)
AST: 21 U/L (ref 0–37)
BUN: 10 mg/dL (ref 6–23)
CO2: 28 mEq/L (ref 19–32)
CREATININE: 0.9 mg/dL (ref 0.4–1.5)
Calcium: 9.8 mg/dL (ref 8.4–10.5)
Chloride: 100 mEq/L (ref 96–112)
GFR: 98.43 mL/min (ref >60.00)
Glucose, Bld: 126 mg/dL — ABNORMAL HIGH (ref 70–99)
POTASSIUM: 4.5 meq/L (ref 3.5–5.1)
Sodium: 138 mEq/L (ref 135–145)
Total Bilirubin: 0.7 mg/dL (ref 0.2–1.2)
Total Protein: 6.5 g/dL (ref 6.0–8.3)

## 2013-12-27 LAB — CBC WITH DIFFERENTIAL/PLATELET
Basophils Absolute: 0.1 10*3/uL (ref 0.0–0.1)
Basophils Relative: 1 % (ref 0.0–3.0)
EOS ABS: 0.5 10*3/uL (ref 0.0–0.7)
Eosinophils Relative: 6.4 % — ABNORMAL HIGH (ref 0.0–5.0)
HCT: 40.4 % (ref 39.0–52.0)
HEMOGLOBIN: 13.4 g/dL (ref 13.0–17.0)
Lymphocytes Relative: 24.7 % (ref 12.0–46.0)
Lymphs Abs: 2 10*3/uL (ref 0.7–4.0)
MCHC: 33.2 g/dL (ref 30.0–36.0)
MCV: 82.4 fl (ref 78.0–100.0)
MONO ABS: 0.6 10*3/uL (ref 0.1–1.0)
Monocytes Relative: 7 % (ref 3.0–12.0)
Neutro Abs: 5 10*3/uL (ref 1.4–7.7)
Neutrophils Relative %: 60.9 % (ref 43.0–77.0)
Platelets: 259 10*3/uL (ref 150.0–400.0)
RBC: 4.91 Mil/uL (ref 4.22–5.81)
RDW: 14.3 % (ref 11.5–15.5)
WBC: 8.1 10*3/uL (ref 4.0–10.5)

## 2013-12-27 LAB — LIPID PANEL
CHOL/HDL RATIO: 4
Cholesterol: 129 mg/dL (ref 0–200)
HDL: 35.4 mg/dL — ABNORMAL LOW (ref >39.00)
LDL CALC: 56 mg/dL (ref 0–99)
NONHDL: 93.6
TRIGLYCERIDES: 187 mg/dL — AB (ref 0.0–149.0)
VLDL: 37.4 mg/dL (ref 0.0–40.0)

## 2013-12-27 LAB — PSA: PSA: 0.53 ng/mL (ref 0.10–4.00)

## 2013-12-27 LAB — TSH: TSH: 2.54 u[IU]/mL (ref 0.35–4.50)

## 2013-12-27 MED ORDER — AMPHETAMINE-DEXTROAMPHET ER 20 MG PO CP24: 20.0000 mg | ORAL_CAPSULE | Freq: Every day | ORAL | Status: DC

## 2013-12-27 NOTE — Progress Notes (Signed)
Pre visit review using our clinic review tool, if applicable. No additional management support is needed unless otherwise documented below in the visit note. 

## 2013-12-27 NOTE — Patient Instructions (Signed)

## 2013-12-27 NOTE — Assessment & Plan Note (Signed)
Exam done Vaccines were reviewed Labs ordered Pt ed material was given 

## 2013-12-27 NOTE — Progress Notes (Signed)
Subjective:    Patient ID: Zachary Graham, male    DOB: 10-25-1960, 53 y.o.   MRN: [id number]  HPI Comments: He returns for a complete physical, he tells me that he feels well and offers no complaints.     Review of Systems  Constitutional: Negative.  Negative for fever, chills, diaphoresis, appetite change and fatigue.  HENT: Negative.   Eyes: Negative.   Respiratory: Negative.  Negative for cough, choking, chest tightness, shortness of breath and stridor.   Cardiovascular: Negative.  Negative for chest pain, palpitations and leg swelling.  Gastrointestinal: Negative.  Negative for nausea, vomiting, abdominal pain, diarrhea and blood in stool.  Endocrine: Negative.  Negative for polydipsia, polyphagia and polyuria.  Genitourinary: Negative.   Musculoskeletal: Negative.  Negative for arthralgias, back pain, joint swelling and myalgias.  Skin: Negative.  Negative for rash.  Allergic/Immunologic: Negative.   Neurological: Negative.   Hematological: Negative.  Negative for adenopathy. Does not bruise/bleed easily.  Psychiatric/Behavioral: Negative.   All other systems reviewed and are negative.      Objective:   Physical Exam  Vitals reviewed. Constitutional: He is oriented to person, place, and time. He appears well-developed and well-nourished. No distress.  HENT:  Head: Normocephalic and atraumatic.  Mouth/Throat: Oropharynx is clear and moist. No oropharyngeal exudate.  Eyes: Conjunctivae are normal. Right eye exhibits no discharge. Left eye exhibits no discharge. No scleral icterus.  Neck: Normal range of motion. Neck supple. No JVD present. No tracheal deviation present. No thyromegaly present.  Cardiovascular: Normal rate, regular rhythm, S1 normal, S2 normal, normal heart sounds and intact distal pulses.   Occasional extrasystoles are present. Exam reveals no gallop, no S3, no S4 and no friction rub.   No murmur heard.  No systolic murmur is present   No diastolic murmur  is present  Pulses:      Carotid pulses are 1+ on the right side, and 1+ on the left side.      Radial pulses are 1+ on the right side, and 1+ on the left side.       Femoral pulses are 1+ on the right side, and 1+ on the left side.      Popliteal pulses are 1+ on the right side, and 1+ on the left side.       Dorsalis pedis pulses are 1+ on the right side, and 1+ on the left side.       Posterior tibial pulses are 1+ on the right side, and 1+ on the left side.  Pulmonary/Chest: Effort normal and breath sounds normal. No stridor. No respiratory distress. He has no wheezes. He has no rales. He exhibits no tenderness.  Abdominal: Soft. Bowel sounds are normal. He exhibits no distension and no mass. There is no tenderness. There is no rebound and no guarding. Hernia confirmed negative in the right inguinal area and confirmed negative in the left inguinal area.  Genitourinary: Rectum normal, prostate normal, testes normal and penis normal. Rectal exam shows no external hemorrhoid, no internal hemorrhoid, no fissure, no mass, no tenderness and anal tone normal. Guaiac negative stool. Prostate is not enlarged and not tender. Right testis shows no mass, no swelling and no tenderness. Right testis is descended. Left testis shows no mass, no swelling and no tenderness. Left testis is descended. Circumcised. No penile erythema or penile tenderness. No discharge found.  Musculoskeletal: Normal range of motion. He exhibits no edema and no tenderness.  Lymphadenopathy:    He has no cervical  adenopathy.       Right: No inguinal adenopathy present.       Left: No inguinal adenopathy present.  Neurological: He is oriented to person, place, and time.  Skin: Skin is warm and dry. No rash noted. He is not diaphoretic. No erythema. No pallor.  Psychiatric: He has a normal mood and affect. His behavior is normal. Judgment and thought content normal.     Lab Results  Component Value Date   WBC 8.3 09/22/2012   HGB  14.4 09/22/2012   HCT 41.7 09/22/2012   PLT 218.0 09/22/2012   GLUCOSE 101* 07/25/2013   CHOL 152 03/01/2013   TRIG 160.0* 03/01/2013   HDL 39.30 03/01/2013   LDLDIRECT 132.1 05/26/2012   LDLCALC 81 03/01/2013   ALT 21 06/20/2012   AST 19 06/20/2012   NA 136 07/25/2013   K 3.8 07/25/2013   CL 101 07/25/2013   CREATININE 0.7 07/25/2013   BUN 13 07/25/2013   CO2 27 07/25/2013   TSH 0.65 11/20/2013   PSA 0.31 09/21/2011   HGBA1C 8.1* 11/20/2013   MICROALBUR 0.4 02/05/2013        Assessment & Plan:

## 2014-02-04 ENCOUNTER — Ambulatory Visit: Payer: 59 | Admitting: Internal Medicine

## 2014-02-09 ENCOUNTER — Encounter: Payer: Self-pay | Admitting: Internal Medicine

## 2014-02-19 ENCOUNTER — Other Ambulatory Visit: Payer: Self-pay | Admitting: *Deleted

## 2014-02-19 ENCOUNTER — Encounter: Payer: Self-pay | Admitting: Internal Medicine

## 2014-02-19 ENCOUNTER — Ambulatory Visit (INDEPENDENT_AMBULATORY_CARE_PROVIDER_SITE_OTHER): Payer: 59 | Admitting: Internal Medicine

## 2014-02-19 VITALS — BP 114/76 | HR 70 | Temp 98.2°F | Resp 12 | Wt 228.0 lb

## 2014-02-19 DIAGNOSIS — E139 Other specified diabetes mellitus without complications: Secondary | ICD-10-CM

## 2014-02-19 DIAGNOSIS — R002 Palpitations: Secondary | ICD-10-CM

## 2014-02-19 MED ORDER — ONETOUCH ULTRASOFT LANCETS MISC: Status: DC

## 2014-02-19 MED ORDER — INSULIN ASPART 100 UNIT/ML ~~LOC~~ SOLN: SUBCUTANEOUS | Status: DC

## 2014-02-19 MED ORDER — GLUCOSE BLOOD VI STRP: ORAL_STRIP | Status: DC

## 2014-02-19 NOTE — Progress Notes (Signed)
Patient ID: Zachary Graham, male   DOB: 04-10-1961, 53 y.o.   MRN: [id number]  HPI: Zachary Graham is a 53 y.o.-year-old male, returning for f/u for DM1, dx 1993, insulin-dependent, uncontrolled, with complications (mild sensory peripheral neuropathy; hypoglycemia episodes). Last visit 3 mo ago.  He started on Aderrall ~5 mo ago. He is still on the [employer]. He continues to lose weight - lost ~30 lbs. Feels great! He has some palpitations but not bothersome.   Last hemoglobin A1c was: 01/2014:  Lab Results  Component Value Date   HGBA1C 8.1* 11/20/2013   HGBA1C 9.0* 07/25/2013   HGBA1C 8.7* 02/05/2013   Pt is on an insulin pump since 2006, currently on Paradigm 751 (3-4 mo old) + enlite CGM. Has Novolog in it.  He tried Metformin in the past >> did not help  Pump settings: - Basal rates - 57.4 units (46%) 12 AM to 2 AM 2.7 >> 2.3 >> 1.7 2 AM to 8 AM: 2.8 >> 3.3 >> 3.0 >> 2.0 8 AM to 12 PM: 3.1 >> 3.5 >> 3.4 >> 3.10 12 PM to 5 PM: 2.6 >> 2.5 >> 2.30 5 PM-12 AM: 2.6 >> 3.0 >> 2.9 >> 2.30 - bolus 66.3 units (54%): insulin to carb ratio:  12 AM to 11 AM: 2.0  11 AM to 5 PM: 2.7  5 PM to 12 AM: 2.0 - ISF: 10 - target: 100-120 - IOB: 4h - Bolus wizard: On- Modified bolusing: no - Changes the pump site: every 3 days  Pt checks his sugars 4-6 a day and 2-3 x a night - calibrates the CGM 6 x a day: - am: 100-120 >> 120-140 >> 186-274 >> 58-200 >> 59x1, 103-287, 333 - 8-9 am: 200s >> 111-226 >> 188-313 >> 83-168 >> 50x1, 107-270, 320 - noon: 170-200s >> 76-255 >> 210, 217 >> 95-263 (369x1) >> 63, 81, 200-238 - before dinner: 150-160 >> 96-226 >> 142-291 >> 70-195 >> 57, 129-197 - bedtime (12 am): 180s >> 140-214 >> 147-288 >> 65-204 (316 x1) >> 108-295 - 2 am: 230-300s >> 140-327 >> 147-308 >> 69-300 >> 158-370 - 4 am: 350s >> 250s >> 144-360 >> 146-293 >> 149-235 >> 188-241 He has some lows, especially before reducing his basal rates; recently 50 was the lowest; he has hypoglycemia  awareness at 60. Highest sugar was 371.  He has a history of severe hypoglycemia episodes early this year. Usually these happen with exercise or in PM. He never had DKA.  He was drinking 10-12 diet cans of soda a day >> now rarely drinks them.  - no CKD, last BUN/creatinine:  Lab Results  Component Value Date   BUN 10 12/27/2013   CREATININE 0.9 12/27/2013  he is on Cozaar. - last set of lipids: Lab Results  Component Value Date   CHOL 129 12/27/2013   HDL 35.40* 12/27/2013   LDLCALC 56 12/27/2013   LDLDIRECT 132.1 05/26/2012   TRIG 187.0* 12/27/2013   CHOLHDL 4 12/27/2013  he is on Zocor. - last eye exam was in 08/2012 - at the [facility]. ? DR.  - + numbness and tingling in his feet.  He also has a history of GERD, hypertension, hyperlipidemia, OSA.   I reviewed pt's medications, allergies, PMH, social hx, family hx and no changes required, except as mentioned above.  ROS: Constitutional: + weight loss, no fatigue, no subjective hyperthermia/hypothermia, nocturia more than once Eyes: no blurry vision, no xerophthalmia ENT: no sore throat, no nodules palpated in  throat, no dysphagia/odynophagia, no hoarseness Cardiovascular: no CP/SOB/+ palpitations - improved on Magnesium/no leg swelling Respiratory: no cough/SOB Gastrointestinal: no N/V/D/C Musculoskeletal: no muscle/joint aches Skin: no rashes Neurological: no tremors/numbness/tingling/dizziness  PE: BP 114/76  Pulse 70  Temp(Src) 98.2 F (36.8 C) (Oral)  Resp 12  Wt 228 lb (103.42 kg)  SpO2 98% Body mass index is 32.71 kg/(m^2).  Wt Readings from Last 3 Encounters:  02/19/14 228 lb (103.42 kg)  12/27/13 240 lb (108.863 kg)  11/20/13 244 lb (110.678 kg)   Constitutional: obese, in NAD Eyes: PERRLA, EOMI, no exophthalmos ENT: moist mucous membranes, no thyromegaly, no cervical lymphadenopathy Cardiovascular: RRR, No MRG Respiratory: CTA B Gastrointestinal: abdomen soft, NT, ND, BS+ Musculoskeletal: no deformities,  strength intact in all 4 Skin: moist, warm, no rashes  ASSESSMENT: 1. DM2, insulin-dependent, uncontrolled, without complications - on pump  2. Palpitations  PLAN:  1. Patient with long-standing type 1 diabetes, on an insulin pump. His sugars are much better after started Adderall and he lost weight! However, he has higher sugars over night >> will increase the basal rates at night and will lower the ICR with dinner:  Patient Instructions  Please change the pump settings as follows: - Basal rates: 12 AM to 2 AM:1.7 >> 1.8 2 AM to 8 AM: 2.0 >> 2.2 8 AM to 12 PM: 3.10 12 PM to 5 PM: 2.30 5 PM-12 AM: 2.30 - bolus: - insulin to carb ratio:  12 AM to 11 AM: 2.0  11 AM to 5 PM: 2.7  5 PM to 12 AM: 2.0 >> 1.6 - ISF: 10 - target: 100-120 - IOB: 4h  Please come back for a follow-up appointment in 3 months - He plans to start exercising >> I advised him to return to the previous settings if he does and he may need temp basal rates - discussed dual wave boluses - with pizza, for e.g. - [facility] >> advised to send this to me - Return in about 3 months (around 05/22/2014).  2. Palpitations - most likely from Adderall - better after we started Mg supplement - TFTs were normal at last visit - we reviewed them  [facility] Dr. Meredith Pel

## 2014-02-19 NOTE — Patient Instructions (Signed)
Please change the pump settings as follows: - Basal rates: 12 AM to 2 AM:1.7 >> 1.8 2 AM to 8 AM: 2.0 >> 2.2 8 AM to 12 PM: 3.10 12 PM to 5 PM: 2.30 5 PM-12 AM: 2.30 - bolus: - insulin to carb ratio:  12 AM to 11 AM: 2.0  11 AM to 5 PM: 2.7  5 PM to 12 AM: 2.0 >> 1.6 - ISF: 10 - target: 100-120 - IOB: 4h  Please come back for a follow-up appointment in 3 months

## 2014-02-21 ENCOUNTER — Other Ambulatory Visit: Payer: Self-pay | Admitting: *Deleted

## 2014-02-21 MED ORDER — ONETOUCH DELICA LANCETS 33G MISC: Status: DC

## 2014-02-21 NOTE — Telephone Encounter (Signed)
Fax from pharmacy, pt requesting to switch to One Touch delica lancets. Done.

## 2014-03-21 ENCOUNTER — Other Ambulatory Visit: Payer: Self-pay | Admitting: Internal Medicine

## 2014-03-22 ENCOUNTER — Encounter: Payer: Self-pay | Admitting: Internal Medicine

## 2014-03-22 ENCOUNTER — Other Ambulatory Visit: Payer: Self-pay | Admitting: Internal Medicine

## 2014-03-24 ENCOUNTER — Other Ambulatory Visit: Payer: Self-pay | Admitting: Internal Medicine

## 2014-03-24 DIAGNOSIS — F900 Attention-deficit hyperactivity disorder, predominantly inattentive type: Secondary | ICD-10-CM

## 2014-03-24 MED ORDER — AMPHETAMINE-DEXTROAMPHET ER 20 MG PO CP24: 20.0000 mg | ORAL_CAPSULE | Freq: Every day | ORAL | Status: DC

## 2014-04-24 ENCOUNTER — Other Ambulatory Visit: Payer: Self-pay | Admitting: Internal Medicine

## 2014-05-03 ENCOUNTER — Encounter: Payer: Self-pay | Admitting: Internal Medicine

## 2014-05-03 ENCOUNTER — Ambulatory Visit (INDEPENDENT_AMBULATORY_CARE_PROVIDER_SITE_OTHER): Payer: 59 | Admitting: Internal Medicine

## 2014-05-03 VITALS — BP 122/80 | HR 69 | Temp 98.2°F | Ht 70.0 in | Wt 219.0 lb

## 2014-05-03 DIAGNOSIS — D2230 Melanocytic nevi of unspecified part of face: Secondary | ICD-10-CM | POA: Insufficient documentation

## 2014-05-03 DIAGNOSIS — D2239 Melanocytic nevi of other parts of face: Secondary | ICD-10-CM

## 2014-05-03 DIAGNOSIS — F900 Attention-deficit hyperactivity disorder, predominantly inattentive type: Secondary | ICD-10-CM

## 2014-05-03 MED ORDER — AMPHETAMINE-DEXTROAMPHET ER 20 MG PO CP24: 20.0000 mg | ORAL_CAPSULE | Freq: Every day | ORAL | Status: DC

## 2014-05-03 NOTE — Patient Instructions (Signed)

## 2014-05-03 NOTE — Progress Notes (Signed)
   Subjective:    Patient ID: Zachary Graham, male    DOB: 10-Aug-1960, 53 y.o.   MRN: [id number]  HPI   Pt states ADD status overall stable on current meds with overall good compliance and tolerability, and good effectiveness with respect to ability for concentration and task completion.  Review of Systems  Constitutional: Negative.  Negative for fever, chills, diaphoresis, appetite change and fatigue.  HENT: Negative.   Eyes: Negative.   Respiratory: Negative.  Negative for cough, choking, chest tightness, shortness of breath and stridor.   Cardiovascular: Negative.  Negative for chest pain, palpitations and leg swelling.  Gastrointestinal: Negative.  Negative for nausea, vomiting, abdominal pain, diarrhea, constipation and blood in stool.  Endocrine: Negative.   Genitourinary: Negative.   Musculoskeletal: Negative.   Skin: Positive for rash.       There are some moles behind his left ear that have been bothering him with itching and occasional drainage for several months, he wants to see a dermatologist about this  Allergic/Immunologic: Negative.   Neurological: Negative.   Hematological: Negative.  Negative for adenopathy. Does not bruise/bleed easily.  Psychiatric/Behavioral: Negative.  Negative for hallucinations, sleep disturbance, dysphoric mood, decreased concentration and agitation. The patient is not nervous/anxious.        Objective:   Physical Exam  Constitutional: He is oriented to person, place, and time. He appears well-developed and well-nourished. No distress.  HENT:  Head: Normocephalic and atraumatic.  Mouth/Throat: Oropharynx is clear and moist. No oropharyngeal exudate.  Eyes: Conjunctivae are normal. Right eye exhibits no discharge. Left eye exhibits no discharge. No scleral icterus.  Neck: Normal range of motion. Neck supple. No JVD present. No tracheal deviation present. No thyromegaly present.  Cardiovascular: Normal rate, regular rhythm, normal heart sounds  and intact distal pulses.  Exam reveals no gallop and no friction rub.   No murmur heard. Pulmonary/Chest: Effort normal and breath sounds normal. No stridor. No respiratory distress. He has no wheezes. He has no rales. He exhibits no tenderness.  Abdominal: Soft. Bowel sounds are normal. He exhibits no distension and no mass. There is no tenderness. There is no rebound and no guarding.  Musculoskeletal: Normal range of motion. He exhibits no edema or tenderness.  Lymphadenopathy:    He has no cervical adenopathy.  Neurological: He is oriented to person, place, and time.  Skin: Skin is warm and dry. No rash noted. He is not diaphoretic. No erythema. No pallor.  3 benign-appearing tan nevi are noted behind his left ear  Vitals reviewed.         Assessment & Plan:

## 2014-05-04 NOTE — Assessment & Plan Note (Signed)
Derm referral at his request

## 2014-05-04 NOTE — Assessment & Plan Note (Signed)
Will cont adderall at the current dose

## 2014-05-16 ENCOUNTER — Encounter: Payer: Self-pay | Admitting: Internal Medicine

## 2014-05-23 ENCOUNTER — Encounter: Payer: Self-pay | Admitting: Internal Medicine

## 2014-05-23 ENCOUNTER — Ambulatory Visit (INDEPENDENT_AMBULATORY_CARE_PROVIDER_SITE_OTHER): Payer: 59 | Admitting: Internal Medicine

## 2014-05-23 VITALS — BP 104/68 | HR 76 | Temp 98.3°F | Resp 12 | Wt 217.4 lb

## 2014-05-23 DIAGNOSIS — E139 Other specified diabetes mellitus without complications: Secondary | ICD-10-CM

## 2014-05-23 MED ORDER — BAYER MICROLET LANCETS MISC: Status: DC

## 2014-05-23 MED ORDER — GLUCOSE BLOOD VI STRP: ORAL_STRIP | Status: DC

## 2014-05-23 MED ORDER — BAYER CONTOUR NEXT LINK W/DEVICE KIT: PACK | Status: DC

## 2014-05-23 NOTE — Patient Instructions (Signed)
Patient Instructions  Please change the pump settings as follows: - Basal rates: 12 AM to 2 AM: 1.65 >> 2.0 2 AM to 8 AM: 2.1 >> 2.4 8 AM to 12 PM: 3.10 12 PM to 5 PM: 2.20 >> 2.4 5 PM-12 AM: 2.25 >> 2.4 - bolus: - insulin to carb ratio:  12 AM to 11 AM: 2.0  11 AM to 5 PM: 2.7 >> 2.2 5 PM to 12 AM: 2.0 >> 1.5 8 pm: 1.6 >> 1.5  - ISF: 10 - target: 100-120 >> 100-100 - IOB: 4h  Please come back for a follow-up appointment in 3 months

## 2014-05-23 NOTE — Progress Notes (Signed)
Patient ID: Zachary Graham, male   DOB: 19-Feb-1961, 54 y.o.   MRN: [id number]  HPI: Zachary Graham is a 54 y.o.-year-old male, returning for f/u for DM1, dx 1993, insulin-dependent, uncontrolled, with complications (mild sensory peripheral neuropathy; hypoglycemia episodes). Last visit 3 mo ago.  He started on Aderrall last year. He is still on the [employer]. He continues to lose weight. Feels great! He has some palpitations but not bothersome.   Last hemoglobin A1c was: 04/05/2014: HbA1c 7.3% Lab Results  Component Value Date   HGBA1C 8.1* 11/20/2013   HGBA1C 9.0* 07/25/2013   HGBA1C 8.7* 02/05/2013   Pt is on an insulin pump since 2006, currently on Paradigm 751 (3-4 mo old) + enlite CGM (not using - feels readings were not accurate). Has Novolog in pump.  He tried Metformin in the past >> did not help  Pump settings: - Basal rates: 12 AM to 2 AM:1.7 >> 1.8 >> 1.65 (?) 2 AM to 8 AM: 2.0 >> 2.2 >> 2.1 (?) 8 AM to 12 PM: 3.10 12 PM to 5 PM: 2.30 >> 2.20 5 PM-12 AM: 2.30 >> 2.25 - bolus: - insulin to carb ratio:  12 AM to 11 AM: 2.0  11 AM to 5 PM: 2.7  5 PM to 12 AM: 2.0 >> 1.6 >> 2.0 (?) 8 pm: 1.6 - ISF: 10 - target: 100-120 - IOB: 4h TDD basal: 54% TDD bolus:46% - Bolus wizard: On- Modified bolusing: no - Changes the pump site: every 3 days  Pt checks his sugars 4-6 a day and 2-3 x a night - ave 188+/-96 - sugars highest at night:   - am: 100-120 >> 120-140 >> 186-274 >> 58-200 >> 59x1, 103-287, 333 >> 79-183, 324 - 8-9 am: 200s >> 111-226 >> 188-313 >> 83-168 >> 50x1, 107-270, 320 >> 145, 186-332 - noon: 170-200s >> 76-255 >> 210, 217 >> 95-263 (369x1) >> 63, 81, 200-238 >> 58-239 - before dinner: 150-160 >> 96-226 >> 142-291 >> 70-195 >> 57, 129-197 >> 150-172 - after dinner: 126-313 - bedtime (12 am): 180s >> 140-214 >> 147-288 >> 65-204 (316 x1) >> 108-295 >> 162-303 - 2 am: 230-300s >> 140-327 >> 147-308 >> 69-300 >> 158-370  >> 99, 190-247 - 4 am: 350s >> 250s  >> 144-360 >> 146-293 >> 149-235 >> 188-241 >> see above  332 (>400 - site issue0  He has a history of severe hypoglycemia episodes early last year. Usually these happen with exercise or in PM. He never had DKA.  - no CKD, last BUN/creatinine:  Lab Results  Component Value Date   BUN 10 12/27/2013   CREATININE 0.9 12/27/2013  he is on Cozaar. - last set of lipids: Lab Results  Component Value Date   CHOL 129 12/27/2013   HDL 35.40* 12/27/2013   LDLCALC 56 12/27/2013   LDLDIRECT 132.1 05/26/2012   TRIG 187.0* 12/27/2013   CHOLHDL 4 12/27/2013  he is on Zocor. - last eye exam was in 09/2013 -  No DR. - + numbness and tingling in his feet. He will see a podiatrist next week.  He also has a history of GERD, hypertension, hyperlipidemia, OSA.   I reviewed pt's medications, allergies, PMH, social hx, family hx and no changes required, except as mentioned above.  ROS: Constitutional: + weight loss, no fatigue, no subjective hyperthermia/hypothermia, nocturia more than once Eyes: no blurry vision, no xerophthalmia ENT: no sore throat, no nodules palpated in throat, no dysphagia/odynophagia, no hoarseness Cardiovascular: no CP/SOB/palpitations/no  leg swelling Respiratory: no cough/SOB Gastrointestinal: no N/V/D/C Musculoskeletal: no muscle/joint aches Skin: no rashes Neurological: no tremors/numbness/tingling/dizziness  PE: BP 104/68 mmHg  Pulse 76  Temp(Src) 98.3 F (36.8 C) (Oral)  Resp 12  Wt 217 lb 6.4 oz (98.612 kg)  SpO2 98% Body mass index is 31.19 kg/(m^2).  Wt Readings from Last 3 Encounters:  05/23/14 217 lb 6.4 oz (98.612 kg)  05/03/14 219 lb (99.338 kg)  02/19/14 228 lb (103.42 kg)   Constitutional: obese, in NAD Eyes: PERRLA, EOMI, no exophthalmos ENT: moist mucous membranes, no thyromegaly, no cervical lymphadenopathy Cardiovascular: RRR, No MRG Respiratory: CTA B Gastrointestinal: abdomen soft, NT, ND, BS+ Musculoskeletal: no deformities, strength  intact in all 4 Skin: moist, warm, no rashes  ASSESSMENT: 1. DM2, insulin-dependent, uncontrolled, without complications - on pump  2. Palpitations  PLAN:  1. Patient with long-standing type 1 diabetes, on an insulin pump. His weight and sugars improved greatly after started Adderall! He has higher sugars over night >> at last visit, we increased the basal rates at night and lowered the ICR with dinner, however, he ended up not making those changes >> will advise to increase basal rates and decrease the ICR. Will also decrease the target:  Patient Instructions   Patient Instructions  Please change the pump settings as follows: - Basal rates: 12 AM to 2 AM: 1.65 >> 2.0 2 AM to 8 AM: 2.1 >> 2.4 8 AM to 12 PM: 3.10 12 PM to 5 PM: 2.20 >> 2.4 5 PM-12 AM: 2.25 >> 2.4 - bolus: - insulin to carb ratio:  12 AM to 11 AM: 2.0  11 AM to 5 PM: 2.7 >> 2.2 5 PM to 12 AM: 2.0 >> 1.5 8 pm: 1.6 >> 1.5  - ISF: 10 - target: 100-120 >> 100-100 - IOB: 4h  Please come back for a follow-up appointment in 3 months  - discussed dual wave boluses - with pizza, for e.g. >> he does that but sugars still high after pizza - [facility] >> reviewed value with pt: 7.3%  - needs  [employer] Contour Rxs - Return in about 3 months (around 08/22/2014).   - time spent with the patient: 40 min, of which >50% was spent in reviewing his pump download, discussing his hypo- and hyper-glycemic episodes, reviewing previous labs and pump settings and developing a plan to avoid hypo- an hyper-glycemia.    [facility] Dr.: Dr. Meredith Pel

## 2014-05-24 ENCOUNTER — Encounter: Payer: Self-pay | Admitting: Internal Medicine

## 2014-05-27 NOTE — Telephone Encounter (Signed)
Record updated, closing phone note.

## 2014-05-29 ENCOUNTER — Encounter: Payer: Self-pay | Admitting: Podiatry

## 2014-05-29 ENCOUNTER — Ambulatory Visit (INDEPENDENT_AMBULATORY_CARE_PROVIDER_SITE_OTHER): Payer: 59 | Admitting: Podiatry

## 2014-05-29 VITALS — BP 140/81 | HR 86 | Ht 69.5 in | Wt 210.0 lb

## 2014-05-29 DIAGNOSIS — M216X1 Other acquired deformities of right foot: Secondary | ICD-10-CM

## 2014-05-29 DIAGNOSIS — M216X9 Other acquired deformities of unspecified foot: Secondary | ICD-10-CM | POA: Insufficient documentation

## 2014-05-29 DIAGNOSIS — M21969 Unspecified acquired deformity of unspecified lower leg: Secondary | ICD-10-CM

## 2014-05-29 DIAGNOSIS — B351 Tinea unguium: Secondary | ICD-10-CM

## 2014-05-29 MED ORDER — EFINACONAZOLE 10 % EX SOLN: 1.0000 | Freq: Every morning | CUTANEOUS | Status: DC

## 2014-05-29 NOTE — Progress Notes (Signed)
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 A1c is at 7.1. Complained of occasional cramping of leg at night. No severe pain or actual discomfort during ambulation.  Patient noted of discolorating toe nail 3rd left.  Wants to continue with his walking exercise. Lost 50 lbs recently.   Review of Systems - General ROS: negative Ophthalmic ROS: Wears glasses. ENT ROS: negative Allergy and Immunology ROS: negative Respiratory ROS: no cough, shortness of breath, or wheezing Cardiovascular ROS: no chest pain or dyspnea on exertion Gastrointestinal ROS: no abdominal pain, change in bowel habits, or black or bloody stools Genito-Urinary ROS: no dysuria, trouble voiding, or hematuria Musculoskeletal ROS: negative Neurological ROS: no TIA or stroke symptoms Dermatological ROS: negative.  Objective: Dermatologic: Normal skin without open lesions. Positive of white discolored nail without thickness 3rd digit left. Neurologic: All epicritic and tactile sensations are grossly intact. Vascular: All pedal pulses are palpable bilateral. No edema or erythema noted. Orthopedic: Tight Achilles tendon on right, unable to flex ankle beyond 90 degree with knee extended. Left lower limb ok able to flex about 15 degree. Positive for excess sagittal plane motion of the first ray bilateral. Positive of subluxed 2nd MPJ medially and spreading 2nd and 3rd digit left. Positive of cavus type foot with rearfoot bilateral.  Radiographic examination reveal cavus type, severe metatarsal adductus bilateral with skewd type foot bilateral.  Assessment: Metatarsus adductus bilateral. Contracted lesser digits bilateral. Hypermobile first ray bilateral. Fungal nail 3rd digit left.   Plan: Reviewed findings and available options.  May benefit from antifungal treatment on 3rd digit left. May benefit from custom orthotics. Both feet casted for orthotics. Stretch exercise reviewed for left tight  Achilles tendon.

## 2014-06-01 ENCOUNTER — Encounter: Payer: Self-pay | Admitting: Internal Medicine

## 2014-06-03 ENCOUNTER — Encounter: Payer: Self-pay | Admitting: Internal Medicine

## 2014-06-03 ENCOUNTER — Other Ambulatory Visit: Payer: Self-pay | Admitting: Internal Medicine

## 2014-06-03 DIAGNOSIS — F900 Attention-deficit hyperactivity disorder, predominantly inattentive type: Secondary | ICD-10-CM

## 2014-06-03 MED ORDER — AMPHETAMINE-DEXTROAMPHET ER 20 MG PO CP24: 20.0000 mg | ORAL_CAPSULE | Freq: Every day | ORAL | Status: DC

## 2014-06-10 ENCOUNTER — Telehealth: Payer: Self-pay | Admitting: *Deleted

## 2014-06-10 NOTE — Telephone Encounter (Signed)
06/10/2014  Dr, Caffie Pinto, Patient called today and he said he had used the Jublia on his small toe and the toe discolorization is already gone,it cleared up the next day! He asked ... Should he continue to use the Jublia and how long  or stop.

## 2014-06-26 ENCOUNTER — Encounter: Payer: 59 | Attending: Internal Medicine | Admitting: *Deleted

## 2014-06-26 ENCOUNTER — Encounter: Payer: Self-pay | Admitting: *Deleted

## 2014-06-26 VITALS — Ht 70.0 in | Wt 217.1 lb

## 2014-06-26 DIAGNOSIS — Z794 Long term (current) use of insulin: Secondary | ICD-10-CM | POA: Insufficient documentation

## 2014-06-26 DIAGNOSIS — Z713 Dietary counseling and surveillance: Secondary | ICD-10-CM | POA: Diagnosis not present

## 2014-06-26 DIAGNOSIS — E139 Other specified diabetes mellitus without complications: Secondary | ICD-10-CM | POA: Diagnosis not present

## 2014-06-26 NOTE — Progress Notes (Signed)
  Medical Nutrition Therapy:  Appt start time: 0800 end time:  0900.  Assessment:  Primary concerns today: Type 1 Diabetes on insulin pump requesting guidance for continued weight loss. This patient is well known to me and has been attending the DM1 Support Group regularly. He has a stressful and time consuming job, so he has little time for exercise, though he does state he walks on treadmill at work for 2 miles twice a week. He lives alone, his family is still up [place]. His father lives locally. He is using [employer] meals for supper and some lunches. He states he is drinking energy drinks for breakfast and often skipping lunch. Very little water intake currently. He states today that he has been diagnosed with ADHD and since he started taking Adderal, his concentration level has improved significantly. He also states he is no longer hungry, which results in more hypoglycemia when meals are skipped.   Preferred Learning Style:   No preference indicated   Learning Readiness:   Ready  Change in progress  MEDICATIONS: see list. Uses Novolog in his Medtronic insulin pump   DIETARY INTAKE:  24-hr recall:  B ( AM): 1 energy drink  Snk ( AM): not unless low BG, which is not often  L ( PM): skips often OR salad with vegetables, protein, lite [place] dressing, diet soda Snk ( PM): same as AM D (8 PM): gets hungry, [employer] meal, diet soda Snk ( PM): popcorn OR [employer] cookies/snacks Beverages: diet soda, Energy drink, water  Usual physical activity: walking on treadmill at work twice a week for 2 miles, would like to increase frequency  Estimated energy needs: 1400 calories 158 g carbohydrates 105 g protein 39 g fat  Progress Towards Goal(s):  In progress.   Nutritional Diagnosis:  NB-1.1 Food and nutrition-related knowledge deficit As related to adequate nutrition for health.  As evidenced by skipping meals and poor water intake.    Intervention:  Nutrition counseling and  diabetes education continued. Reviewed rationale of eating breakfast and throughout the day, increasing his water intake to prevent dehydration and options for increasing his activity level. Provided suggestions for easy breakfast meal options and suggested he come up with a plan for the water such as every other beverage be water or consider either disposable water bottles or consider a "fancy" one that he felt comfortable refilling throughout the day.  He is interested in working with me on his pump settings, I will request permission from Dr. Philemon Kingdom for permission to do so.   Plan: Consider more nutritious options for breakfast: [employer] manager, Protein Bar, Boost Glucose Control or other supplemental beverage Consider ways to increase your water intake such as every other beverage being water or a water bottle that appeals to you. Continue with your current activity level of walking on treadmill twice a week, increasing frequency as tolerated. You have expressed interest in working with me on pump adjustments, I will request permission from your Endocrinologist.  Teaching Method Utilized: Visual and Auditory  Handouts given during visit include:  Provided samples of Protein Bar, Heritage manager packet and Boost Glucose Control drink  Barriers to learning/adherence to lifestyle change:limited time with high stress job and also in school at night  Demonstrated degree of understanding via:  Teach Back   Monitoring/Evaluation:  Dietary intake, exercise, increased water intake, and body weight prn.

## 2014-06-26 NOTE — Patient Instructions (Signed)
Plan: Consider more nutritious options for breakfast: [employer] manager, Protein Bar, Boost Glucose Control or other supplemental beverage Consider ways to increase your water intake such as every other beverage being water or a water bottle that appeals to you. Continue with your current activity level of walking on treadmill twice a week, increasing frequency as tolerated. You have expressed interest in working with me on pump adjustments, I will request permission from your Endocrinologist.

## 2014-06-28 ENCOUNTER — Encounter: Payer: Self-pay | Admitting: Internal Medicine

## 2014-06-30 ENCOUNTER — Other Ambulatory Visit: Payer: Self-pay | Admitting: Internal Medicine

## 2014-06-30 DIAGNOSIS — F900 Attention-deficit hyperactivity disorder, predominantly inattentive type: Secondary | ICD-10-CM

## 2014-06-30 MED ORDER — AMPHETAMINE-DEXTROAMPHET ER 20 MG PO CP24: 20.0000 mg | ORAL_CAPSULE | Freq: Every day | ORAL | Status: DC

## 2014-07-03 ENCOUNTER — Ambulatory Visit: Payer: 59 | Admitting: Internal Medicine

## 2014-07-17 ENCOUNTER — Ambulatory Visit (INDEPENDENT_AMBULATORY_CARE_PROVIDER_SITE_OTHER): Payer: 59 | Admitting: Internal Medicine

## 2014-07-17 ENCOUNTER — Other Ambulatory Visit (INDEPENDENT_AMBULATORY_CARE_PROVIDER_SITE_OTHER): Payer: 59

## 2014-07-17 ENCOUNTER — Encounter: Payer: Self-pay | Admitting: Internal Medicine

## 2014-07-17 VITALS — BP 116/68 | HR 54 | Temp 98.1°F | Resp 16 | Wt 212.0 lb

## 2014-07-17 DIAGNOSIS — E559 Vitamin D deficiency, unspecified: Secondary | ICD-10-CM

## 2014-07-17 DIAGNOSIS — R300 Dysuria: Secondary | ICD-10-CM | POA: Insufficient documentation

## 2014-07-17 DIAGNOSIS — N410 Acute prostatitis: Secondary | ICD-10-CM | POA: Insufficient documentation

## 2014-07-17 DIAGNOSIS — E139 Other specified diabetes mellitus without complications: Secondary | ICD-10-CM

## 2014-07-17 DIAGNOSIS — F900 Attention-deficit hyperactivity disorder, predominantly inattentive type: Secondary | ICD-10-CM

## 2014-07-17 LAB — BASIC METABOLIC PANEL
BUN: 11 mg/dL (ref 6–23)
CHLORIDE: 101 meq/L (ref 96–112)
CO2: 31 mEq/L (ref 19–32)
Calcium: 9.3 mg/dL (ref 8.4–10.5)
Creatinine, Ser: 0.87 mg/dL (ref 0.40–1.50)
GFR: 98.22 mL/min (ref >60.00)
Glucose, Bld: 171 mg/dL — ABNORMAL HIGH (ref 70–99)
POTASSIUM: 4.1 meq/L (ref 3.5–5.1)
Sodium: 136 mEq/L (ref 135–145)

## 2014-07-17 LAB — URINALYSIS, ROUTINE W REFLEX MICROSCOPIC
Bilirubin Urine: NEGATIVE
Hgb urine dipstick: NEGATIVE
Ketones, ur: NEGATIVE
Leukocytes, UA: NEGATIVE
Nitrite: NEGATIVE
RBC / HPF: NONE SEEN (ref 0–?)
Specific Gravity, Urine: 1.02 (ref 1.000–1.030)
TOTAL PROTEIN, URINE-UPE24: NEGATIVE
Urine Glucose: 100 — AB
Urobilinogen, UA: 0.2 (ref 0.0–1.0)
pH: 6 (ref 5.0–8.0)

## 2014-07-17 LAB — HEMOGLOBIN A1C: Hgb A1c MFr Bld: 8.2 % — ABNORMAL HIGH (ref 4.6–6.5)

## 2014-07-17 MED ORDER — AMPHETAMINE-DEXTROAMPHET ER 20 MG PO CP24: 20.0000 mg | ORAL_CAPSULE | Freq: Every day | ORAL | Status: DC

## 2014-07-17 MED ORDER — LOSARTAN POTASSIUM 50 MG PO TABS: 50.0000 mg | ORAL_TABLET | Freq: Every day | ORAL | Status: DC

## 2014-07-17 MED ORDER — CIPROFLOXACIN HCL 500 MG PO TABS: 500.0000 mg | ORAL_TABLET | Freq: Two times a day (BID) | ORAL | Status: DC

## 2014-07-17 MED ORDER — SIMVASTATIN 40 MG PO TABS: 40.0000 mg | ORAL_TABLET | Freq: Every day | ORAL | Status: DC

## 2014-07-17 MED ORDER — PANTOPRAZOLE SODIUM 40 MG PO TBEC
40.0000 mg | DELAYED_RELEASE_TABLET | Freq: Every day | ORAL | Status: DC
Start: 2014-07-17 — End: 2014-11-14

## 2014-07-17 NOTE — Progress Notes (Signed)
Subjective:    Patient ID: Zachary Graham, male    DOB: [birth year], 54 y.o.   MRN: [id number]  HPI Comments:  Pt states ADD status overall stable on current meds with overall good compliance and tolerability, and good effectiveness with respect to ability for concentration and task completion.  Dysuria  This is a new problem. The current episode started 1 to 4 weeks ago. The problem occurs intermittently. The problem has been unchanged. The quality of the pain is described as burning. The pain is at a severity of 1/10. There has been no fever. He is sexually active. There is no history of pyelonephritis. Associated symptoms include frequency. Pertinent negatives include no chills, discharge, flank pain, hematuria, hesitancy, nausea, sweats, urgency or vomiting. He has tried nothing for the symptoms. There is no history of catheterization, kidney stones, recurrent UTIs, a single kidney, urinary stasis or a urological procedure.      Review of Systems  Constitutional: Negative.  Negative for fever, chills, diaphoresis, appetite change and fatigue.  HENT: Negative.   Eyes: Negative.   Respiratory: Negative.  Negative for cough, choking, chest tightness, shortness of breath and stridor.   Cardiovascular: Negative.  Negative for chest pain, palpitations and leg swelling.  Gastrointestinal: Negative.  Negative for nausea, vomiting, abdominal pain, constipation and blood in stool.  Endocrine: Negative.   Genitourinary: Positive for dysuria and frequency. Negative for hesitancy, urgency, hematuria, flank pain, decreased urine volume, penile swelling, scrotal swelling, enuresis, difficulty urinating, penile pain and testicular pain.  Musculoskeletal: Negative.   Skin: Negative.  Negative for rash.  Allergic/Immunologic: Negative.   Neurological: Negative.   Hematological: Negative.  Negative for adenopathy. Does not bruise/bleed easily.  Psychiatric/Behavioral: Negative.        Objective:   Physical Exam  Constitutional: He is oriented to person, place, and time. He appears well-developed and well-nourished. No distress.  HENT:  Head: Normocephalic and atraumatic.  Mouth/Throat: Oropharynx is clear and moist. No oropharyngeal exudate.  Eyes: Conjunctivae are normal. Right eye exhibits no discharge. Left eye exhibits no discharge. No scleral icterus.  Neck: Normal range of motion. Neck supple. No JVD present. No tracheal deviation present. No thyromegaly present.  Cardiovascular: Normal rate, regular rhythm, normal heart sounds and intact distal pulses.  Exam reveals no gallop and no friction rub.   No murmur heard. Pulmonary/Chest: Effort normal and breath sounds normal. No stridor. No respiratory distress. He has no wheezes. He has no rales. He exhibits no tenderness.  Abdominal: Soft. Bowel sounds are normal. He exhibits no distension and no mass. There is no tenderness. There is no rebound and no guarding. Hernia confirmed negative in the right inguinal area and confirmed negative in the left inguinal area.  Genitourinary: Rectum normal, testes normal and penis normal. Rectal exam shows no external hemorrhoid, no internal hemorrhoid, no fissure, no mass, no tenderness and anal tone normal. Guaiac negative stool. Prostate is enlarged (1+ BPH rt lobe larger than left lobe and it is boggy and tender) and tender. Right testis shows no mass, no swelling and no tenderness. Right testis is descended. Left testis shows no mass, no swelling and no tenderness. Left testis is descended. Circumcised. No penile erythema or penile tenderness. No discharge found.  Musculoskeletal: Normal range of motion. He exhibits no edema or tenderness.  Lymphadenopathy:    He has no cervical adenopathy.       Right: No inguinal adenopathy present.       Left: No inguinal adenopathy present.  Neurological: He is oriented to person, place, and time.  Skin: Skin is warm and dry. No rash noted. He is not  diaphoretic. No erythema. No pallor.  Psychiatric: He has a normal mood and affect. His behavior is normal. Judgment and thought content normal.  Vitals reviewed.    Lab Results  Component Value Date   WBC 8.1 12/27/2013   HGB 13.4 12/27/2013   HCT 40.4 12/27/2013   PLT 259.0 12/27/2013   GLUCOSE 126* 12/27/2013   CHOL 129 12/27/2013   TRIG 187.0* 12/27/2013   HDL 35.40* 12/27/2013   LDLDIRECT 132.1 05/26/2012   LDLCALC 56 12/27/2013   ALT 34 12/27/2013   AST 21 12/27/2013   NA 138 12/27/2013   K 4.5 12/27/2013   CL 100 12/27/2013   CREATININE 0.9 12/27/2013   BUN 10 12/27/2013   CO2 28 12/27/2013   TSH 2.54 12/27/2013   PSA 0.53 12/27/2013   HGBA1C 8.1* 11/20/2013   MICROALBUR 0.4 02/05/2013       Assessment & Plan:

## 2014-07-17 NOTE — Assessment & Plan Note (Signed)
I will recheck his Vit D level and will advise further

## 2014-07-17 NOTE — Patient Instructions (Signed)

## 2014-07-17 NOTE — Assessment & Plan Note (Signed)
He is doing well on adderall

## 2014-07-17 NOTE — Assessment & Plan Note (Signed)
This appears to be caused by an acute prostate infection Will check his UA for confirmation

## 2014-07-17 NOTE — Assessment & Plan Note (Signed)
His blood sugars have been well controlled He is die for an A1C, will also monitor his renal function

## 2014-07-17 NOTE — Assessment & Plan Note (Signed)
Will check his UA and urine clx to try to confirm the infection Will treat with cipro

## 2014-07-17 NOTE — Progress Notes (Signed)
Pre visit review using our clinic review tool, if applicable. No additional management support is needed unless otherwise documented below in the visit note. 

## 2014-07-18 ENCOUNTER — Encounter: Payer: Self-pay | Admitting: Internal Medicine

## 2014-07-18 LAB — VITAMIN D 25 HYDROXY (VIT D DEFICIENCY, FRACTURES): VITD: 35.03 ng/mL (ref 30.00–100.00)

## 2014-07-19 LAB — CULTURE, URINE COMPREHENSIVE
COLONY COUNT: NO GROWTH
ORGANISM ID, BACTERIA: NO GROWTH

## 2014-08-22 ENCOUNTER — Ambulatory Visit: Payer: 59 | Admitting: Internal Medicine

## 2014-08-23 ENCOUNTER — Ambulatory Visit (INDEPENDENT_AMBULATORY_CARE_PROVIDER_SITE_OTHER): Payer: 59 | Admitting: Internal Medicine

## 2014-08-23 ENCOUNTER — Encounter: Payer: Self-pay | Admitting: Internal Medicine

## 2014-08-23 VITALS — BP 108/62 | HR 40 | Temp 98.0°F | Resp 12 | Wt 212.0 lb

## 2014-08-23 DIAGNOSIS — E139 Other specified diabetes mellitus without complications: Secondary | ICD-10-CM | POA: Diagnosis not present

## 2014-08-23 NOTE — Patient Instructions (Signed)
Please continue: - Basal rates: 12 AM to 2 AM: 2.0 2 AM to 8 AM: 2.4 8 AM to 12 PM: 3.10 12 PM to 5 PM: 2.4 5 PM-12 AM: 2.4 - bolus: - insulin to carb ratio:  12 AM to 11 AM: 2.0  11 AM to 5 PM: 2.2 5 PM to 12 AM: 1.5 8 pm: 1.5  - ISF: 10 - target: 100-100 - IOB: 4h  Please try to check sugars at least 4x a day, before each meal and at bedtime. Try to stay with a consistent nr or of carbs per meal. Try to bolus with the Bolus Wizard >90% of the time.  Schedule an appt with Bev in 3 weeks.  Please return in 1.5 month.

## 2014-08-23 NOTE — Progress Notes (Signed)
Patient ID: Zachary Graham, male   DOB: Jun 21, 1960, 54 y.o.   MRN: [id number]  HPI: Zachary REIERSON is a 54 y.o.-year-old male, returning for f/u for DM1, dx 1993, insulin-dependent, uncontrolled, with complications (mild sensory peripheral neuropathy; hypoglycemia episodes). Last visit 3 mo ago.  He started on Aderrall last year.   He started an exercise program with [employer]. At the beginning of the week, he felt flu-like sxs. Sugars higher.   He started Cipro for prostatitis - dx 5 weeks >> continues for 8 weeks.  Last hemoglobin A1c was: Lab Results  Component Value Date   HGBA1C 8.2* 07/17/2014   HGBA1C 8.1* 11/20/2013   HGBA1C 9.0* 07/25/2013  04/05/2014: HbA1c 7.3%  Pt is on an insulin pump since 2006, currently on Paradigm 751 (6 mo old) + enlite CGM (not using - feels readings were not accurate). Has Novolog in pump.  He tried Metformin in the past >> did not help  Pump settings: - Basal rates: 12 AM to 2 AM: 1.65 >> 2.0 2 AM to 8 AM: 2.1 >> 2.4 8 AM to 12 PM: 3.10 12 PM to 5 PM: 2.20 >> 2.4 5 PM-12 AM: 2.25 >> 2.4 - bolus: - insulin to carb ratio:  12 AM to 11 AM: 2.0  11 AM to 5 PM: 2.7 >> 2.2 5 PM to 12 AM: 2.0 >> 1.5 8 pm: 1.6 >> 1.5  - ISF: 10 - target: 100-120 >> 100-100 - IOB: 4h  TDD basal: 54% TDD bolus: 46% TDD 110 units - Bolus wizard: On- Modified bolusing: no - Changes the pump site: every 3 days  Pt checks his sugars 2-3 a day- ave 188+/-96 >> 227 +/- 84 now - sugars highest at night:   - am: 100-120 >> 120-140 >> 186-274 >> 58-200 >> 59x1, 103-287, 333 >> 79-183, 324 >> 97-334, 394 - 8-9 am: 200s >> 111-226 >> 188-313 >> 83-168 >> 50x1, 107-270, 320 >> 145, 186-332 >>165, 367 - noon: 170-200s >> 76-255 >> 210, 217 >> 95-263 (369x1) >> 63, 81, 200-238 >> 58-239 >> 125, 167 - before dinner: 150-160 >> 96-226 >> 142-291 >> 70-195 >> 57, 129-197 >> 150-172 >> 102-265 - after dinner: 126-313 >> 140-301 - bedtime (12 am): 180s >> 140-214 >> 147-288 >>  65-204 (316 x1) >> 108-295 >> 162-303 >> see above - 2 am: 230-300s >> 140-327 >> 147-308 >> 69-300 >> 158-370  >> 99, 190-247 >> 217, 242 - 4 am: 350s >> 250s >> 144-360 >> 146-293 >> 149-235 >> 188-241 >> see above >> 256, 360  He has a history of severe hypoglycemia episodes early last year. Usually these happen with exercise or in PM. He never had DKA.  - no CKD, last BUN/creatinine:  Lab Results  Component Value Date   BUN 11 07/17/2014   CREATININE 0.87 07/17/2014  he is on Cozaar. - last set of lipids: Lab Results  Component Value Date   CHOL 129 12/27/2013   HDL 35.40* 12/27/2013   LDLCALC 56 12/27/2013   LDLDIRECT 132.1 05/26/2012   TRIG 187.0* 12/27/2013   CHOLHDL 4 12/27/2013  he is on Zocor. - last eye exam was in 09/2013 -  No DR. - + numbness and tingling in his feet. He will see a podiatrist next week.  He also has a history of GERD, hypertension, hyperlipidemia, OSA.   I reviewed pt's medications, allergies, PMH, social hx, family hx and no changes required, except as mentioned above.  ROS: Constitutional: +  weight loss, no fatigue, no subjective hyperthermia/hypothermia Eyes: no blurry vision, no xerophthalmia ENT: no sore throat, no nodules palpated in throat, no dysphagia/odynophagia, no hoarseness Cardiovascular: no CP/SOB/palpitations/no leg swelling Respiratory: no cough/SOB Gastrointestinal: no N/V/D/C Musculoskeletal: no muscle/joint aches Skin: no rashes Neurological: no tremors/numbness/tingling/dizziness  PE: BP 108/62 mmHg  Pulse 40  Temp(Src) 98 F (36.7 C) (Oral)  Resp 12  Wt 212 lb (96.163 kg)  SpO2 97% Body mass index is 30.42 kg/(m^2).  Wt Readings from Last 3 Encounters:  08/23/14 212 lb (96.163 kg)  07/17/14 212 lb (96.163 kg)  06/26/14 217 lb 1.6 oz (98.476 kg)   Constitutional: obese, in NAD Eyes: PERRLA, EOMI, no exophthalmos ENT: moist mucous membranes, no thyromegaly, no cervical lymphadenopathy Cardiovascular: RRR, No  MRG Respiratory: CTA B Gastrointestinal: abdomen soft, NT, ND, BS+ Musculoskeletal: no deformities, strength intact in all 4 Skin: moist, warm, no rashes  ASSESSMENT: 1. DM2, insulin-dependent, uncontrolled, without complications - on pump  2. Palpitations  PLAN:  1. Patient with long-standing type 1 diabetes, on an insulin pump. His weight and sugars improved greatly after started Adderall! At last visit and continuing at this visit, he has higher sugars late afternoon and over night >> at last visit, we increased the basal rates at night and lowered the ICR with dinner, but this was not enough. Barriers to good control:  Very busy schedule >> no time to check sugars and bolus during the day  Lack of sleep >> sometimes sleeps 3h a night (!) and tries to catch up in the weekend  Reaches home late at night  - sometimes at 1 am >> eats most of the daily calories then  - sometimes forgets to bolus and boluses after he eats  Has prostatitis, which can increase his sugars (chronic infection) - on 2 mo of Cipro - I advised him to try for at least 1-2 weeks before he comes back to see the DM educator or me to check and bolus before every meal and also try to eat the same amount of carbs with the same meal.  - I also explained how to do basal testing and ICR validation - again advised to do dual wave boluses for pizza or other high fat meals - for now will continue the same settings Patient Instructions  Please continue: - Basal rates: 12 AM to 2 AM: 2.0 2 AM to 8 AM: 2.4 8 AM to 12 PM: 3.10 12 PM to 5 PM: 2.4 5 PM-12 AM: 2.4 - bolus: - insulin to carb ratio:  12 AM to 11 AM: 2.0  11 AM to 5 PM: 2.2 5 PM to 12 AM: 1.5 8 pm: 1.5  - ISF: 10 - target: 100-100 - IOB: 4h  Please try to check sugars at least 4x a day, before each meal and at bedtime. Try to stay with a consistent nr or of carbs per meal. Try to bolus with the Bolus Wizard >90% of the time.  Schedule an appt with  Bev in 3 weeks.  Please return in 1.5 month.  - he had a HbA1c 1 mo ago - 8.2% - reviewed this with him - Return in about 6 weeks (around 10/04/2014).   - time spent with the patient: 40 min, of which >50% was spent in reviewing his pump download, discussing his hypo- and hyper-glycemic episodes, reviewing previous labs and pump settings and developing a plan to avoid hypo- an hyper-glycemia.   [facility] Dr.: Dr. Meredith Pel

## 2014-08-29 LAB — HM DIABETES EYE EXAM

## 2014-10-01 ENCOUNTER — Ambulatory Visit: Payer: 59 | Admitting: *Deleted

## 2014-10-04 ENCOUNTER — Ambulatory Visit (INDEPENDENT_AMBULATORY_CARE_PROVIDER_SITE_OTHER): Payer: 59 | Admitting: Internal Medicine

## 2014-10-04 ENCOUNTER — Encounter: Payer: Self-pay | Admitting: Internal Medicine

## 2014-10-04 VITALS — BP 122/82 | HR 76 | Temp 97.8°F | Resp 18 | Wt 212.0 lb

## 2014-10-04 DIAGNOSIS — E139 Other specified diabetes mellitus without complications: Secondary | ICD-10-CM | POA: Diagnosis not present

## 2014-10-04 MED ORDER — GLUCOSE BLOOD VI STRP: ORAL_STRIP | Status: DC

## 2014-10-04 NOTE — Progress Notes (Signed)
Patient ID: Zachary Graham, male   DOB: Sep 03, 1960, 54 y.o.   MRN: [id number]  HPI: Zachary Graham is a 54 y.o.-year-old male, returning for f/u for DM1, dx 1993, insulin-dependent, uncontrolled, with complications (mild sensory peripheral neuropathy; hypoglycemia episodes). Last visit 3 mo ago.  He started on Aderrall last year. He started to lose weight and his sugars improved afterwards.  He is very busy at work, and many times comes home at 1 AM. He does not have too much time to eat at work.  Last hemoglobin A1c was: Lab Results  Component Value Date   HGBA1C 8.2* 07/17/2014   HGBA1C 8.1* 11/20/2013   HGBA1C 9.0* 07/25/2013  04/05/2014: HbA1c 7.3%  Pt is on an insulin pump since 2006, currently on Paradigm 751 (6 mo old) + enlite CGM (not using - feels readings were not accurate). Has Novolog in pump.  He tried Metformin in the past >> did not help  Pump settings: - Basal rates: 12 AM to 2 AM: 2.0 2 AM to 8 AM: 2.4 8 AM to 12 PM: 3.10 12 PM to 5 PM: 2.4 5 PM-12 AM: 2.4 - bolus: - insulin to carb ratio:  12 AM to 2 AM: 2.0  2 AM to 8 AM: 2.4 8 AM to 12 PM: 3.1 12 PM to 5 PM: 2.4 5 PM-12 AM: 2.4  - ISF: 10 - target: 100-120 - IOB: 4h TDD basal: 50 % TDD bolus: 50 % TDD 118 units - Bolus wizard: On- Modified bolusing: no - Changes the pump site: every 2 days  Pt checks his sugars 2-3 a day- ave 188+/-96 >> 227 +/- 84 >> 154 +/- 73  now - sugars highest at night:   - am: 186-274 >> 58-200 >> 59x1, 103-287, 333 >> 79-183, 324 >> 97-334, 394 >> 59-272 - 8-9 am: 188-313 >> 83-168 >> 50x1, 107-270, 320 >> 145, 186-332 >>165, 367 >> 92-242 - noon: 210, 217 >> 95-263 (369x1) >> 63, 81, 200-238 >> 58-239 >> 125, 167 >> 81-133 - before dinner:142-291 >> 70-195 >> 57, 129-197 >> 150-172 >> 102-265 >> 81-153, 269 - after dinner: 126-313 >> 140-301 >> 68-142, 344  - bedtime (12 am): 147-288 >> 65-204 (316 x1) >> 108-295 >> 162-303 >> see above - 2 am: 147-308 >> 69-300 >>  158-370  >> 99, 190-247 >> 217, 242 >> 62-230  - 4 am: 144-360 >> 146-293 >> 149-235 >> 188-241 >> see above >> 256, 360 >> 79-234, 369   He has a history of severe hypoglycemia episodes early last year. Usually these happen with exercise or in PM. He never had DKA.  - no CKD, last BUN/creatinine:  Lab Results  Component Value Date   BUN 11 07/17/2014   CREATININE 0.87 07/17/2014  he is on Cozaar. - last set of lipids: Lab Results  Component Value Date   CHOL 129 12/27/2013   HDL 35.40* 12/27/2013   LDLCALC 56 12/27/2013   LDLDIRECT 132.1 05/26/2012   TRIG 187.0* 12/27/2013   CHOLHDL 4 12/27/2013  he is on Zocor. - last eye exam was in 09/2013 -  No DR. - + numbness and tingling in his feet. He will see a podiatrist next week.  He also has a history of GERD, hypertension, hyperlipidemia, OSA.   Before last visit, he was diagnosed with prostatitis, and was on several months of antibiotics. He is off antibiotics now.   I reviewed pt's medications, allergies, PMH, social hx, family hx, and changes  were documented in the history of present illness. Otherwise, unchanged from my initial visit note.  ROS: Constitutional: no weight loss, no fatigue, no subjective hyperthermia/hypothermia Eyes: no blurry vision, no xerophthalmia ENT: no sore throat, no nodules palpated in throat, no dysphagia/odynophagia, no hoarseness Cardiovascular: no CP/SOB/palpitations/no leg swelling Respiratory: no cough/SOB Gastrointestinal: no N/V/D/C Musculoskeletal: no muscle/joint aches Skin: no rashes Neurological: no tremors/numbness/tingling/dizziness  PE: BP 122/82 mmHg  Pulse 76  Temp(Src) 97.8 F (36.6 C) (Oral)  Resp 18  Wt 212 lb (96.163 kg)  SpO2 99% Body mass index is 30.42 kg/(m^2).  Wt Readings from Last 3 Encounters:  10/04/14 212 lb (96.163 kg)  08/23/14 212 lb (96.163 kg)  07/17/14 212 lb (96.163 kg)   Constitutional: obese, in NAD Eyes: PERRLA, EOMI, no exophthalmos ENT:  moist mucous membranes, no thyromegaly, no cervical lymphadenopathy Cardiovascular: RRR, No MRG Respiratory: CTA B Gastrointestinal: abdomen soft, NT, ND, BS+ Musculoskeletal: no deformities, strength intact in all 4 Skin: moist, warm, no rashes  ASSESSMENT: 1. DM2, insulin-dependent, uncontrolled, without complications - on pump  PLAN:  1. Patient with long-standing type 1 diabetes, on an insulin pump. His weight and sugars improved since last visit as he is more active. He continues to have more variable, but usually higher sugars late afternoon and over night as he gets home late at night and he eats repeatedly after he gets home. He sometimes forgets to bolus for dinner and his sugars increased to mid 200s in that case. Also, his sugars are higher when he eats out usually Fridays ordered during the weekend. He can also have lows are normal sugars later at night, usually during the week. Since there is no consistent pattern, I advised him to try to split the calories that he eats at night throughout the day, and also make notes of what he eats at night and whether he exercises in those days. I advised him to schedule another appointment with Leonia Reader, our diabetes educator in approximately a month to review his log and the pump download. Barriers to good control:  Very busy schedule >> no time to check sugars and bolus during the day  Lack of sleep >> sometimes sleeps 3h a night (!) and tries to catch up in the weekend  Reaches home late at night  - sometimes at 1 am >> eats most of the daily calories then  - sometimes forgets to bolus and boluses after he eats - I again advised him to try for at least 1-2 weeks before he comes back to see the DM educator or me to check and bolus before every meal and also try to eat the same amount of carbs with the same meal. He also needs to spread his meals throughout the day, and not eat the entire amount of daily calories at night - again advised  to do dual wave boluses for pizza or other high fat meals, but he does not eat high-fat meals anymore. He did not have pizza since last visit. - for now will continue the same settings Patient Instructions  Please continue the same pump settings: - Basal rates: 12 AM to 2 AM: 2.0 2 AM to 8 AM: 2.4 8 AM to 12 PM: 3.10 12 PM to 5 PM: 2.4 5 PM-12 AM: 2.4 - bolus: - insulin to carb ratio:  12 AM to 2 AM: 2.0  2 AM to 8 AM: 2.4 8 AM to 12 PM: 3.1 12 PM to 5 PM: 2.4 5 PM-12  AM: 2.4  - ISF: 10 - target: 100-120 - IOB: 4h  Please return in 3 months with your sugar log.   - Needs another hemoglobin A1c after 10/17/2014 - Return in about 3 months (around 01/04/2015).   - time spent with the patient: 25 min, of which >50% was spent in reviewing his pump download, discussing his hypo- and hyper-glycemic episodes, reviewing previous labs and pump settings and developing a plan to avoid hypo- an hyper-glycemia.

## 2014-10-04 NOTE — Patient Instructions (Signed)
Please change the setting: - Basal rates: 12 AM to 2 AM: 2.0 2 AM to 8 AM: 2.4 8 AM to 12 PM: 3.10 12 PM to 5 PM: 2.4 5 PM-12 AM: 2.4 - bolus: - insulin to carb ratio:  12 AM to 11 AM: 2.0  11 AM to 5 PM: 2.2 5 PM to 12 AM: 1.5 8 pm: 1.5  - ISF: 10 - target: 100-100 - IOB: 4h  Please return in 3 months with your sugar log.

## 2014-11-13 ENCOUNTER — Ambulatory Visit (INDEPENDENT_AMBULATORY_CARE_PROVIDER_SITE_OTHER): Payer: 59 | Admitting: Podiatry

## 2014-11-13 ENCOUNTER — Encounter: Payer: Self-pay | Admitting: Podiatry

## 2014-11-13 VITALS — BP 126/78 | HR 70 | Ht 69.5 in | Wt 204.0 lb

## 2014-11-13 DIAGNOSIS — M216X9 Other acquired deformities of unspecified foot: Secondary | ICD-10-CM

## 2014-11-13 DIAGNOSIS — M21969 Unspecified acquired deformity of unspecified lower leg: Secondary | ICD-10-CM

## 2014-11-13 NOTE — Patient Instructions (Signed)
Reviewed clinical finding and biomechanics of foot. Need to keep orthotics in running shoes.

## 2014-11-13 NOTE — Progress Notes (Signed)
Subjective: 54 year old male presents for follow up on his blister. He is running 3 miles a day 6 days a week. He developed blisters on balls of both feet. He is diabetic for 33 years and under control.   Objective:  Dermatologic: Positive of dry white old blister under the ball of right foot.  Neurologic: All epicritic and tactile sensations are grossly intact. Vascular: All pedal pulses are palpable bilateral. No edema or erythema noted. Orthopedic: Tight Achilles tendon bilateral. Positive for excess sagittal plane motion of the first ray bilateral.  Positive of subluxed 2nd MPJ medially and spreading 2nd and 3rd digit left.  Positive of high arched cavus type foot with rearfoot varus bilateral.   Assessment: Metatarsus adductus bilateral. Contracted lesser digits bilateral. Hypermobile first ray bilateral. Dry blister under ball of right foot following running exercise.   Plan: Advised to keep orthotics in running shoes.  Continue stretch exercise for left tight Achilles tendon

## 2014-11-14 ENCOUNTER — Ambulatory Visit (INDEPENDENT_AMBULATORY_CARE_PROVIDER_SITE_OTHER): Payer: 59 | Admitting: Internal Medicine

## 2014-11-14 ENCOUNTER — Other Ambulatory Visit (INDEPENDENT_AMBULATORY_CARE_PROVIDER_SITE_OTHER): Payer: 59

## 2014-11-14 ENCOUNTER — Encounter: Payer: Self-pay | Admitting: Internal Medicine

## 2014-11-14 VITALS — BP 112/70 | HR 71 | Temp 98.2°F | Resp 16 | Ht 69.5 in | Wt 215.0 lb

## 2014-11-14 DIAGNOSIS — E7800 Pure hypercholesterolemia, unspecified: Secondary | ICD-10-CM

## 2014-11-14 DIAGNOSIS — E139 Other specified diabetes mellitus without complications: Secondary | ICD-10-CM | POA: Diagnosis not present

## 2014-11-14 DIAGNOSIS — E781 Pure hyperglyceridemia: Secondary | ICD-10-CM | POA: Diagnosis not present

## 2014-11-14 DIAGNOSIS — J301 Allergic rhinitis due to pollen: Secondary | ICD-10-CM

## 2014-11-14 DIAGNOSIS — F900 Attention-deficit hyperactivity disorder, predominantly inattentive type: Secondary | ICD-10-CM | POA: Diagnosis not present

## 2014-11-14 LAB — HEMOGLOBIN A1C: Hgb A1c MFr Bld: 7.6 % — ABNORMAL HIGH (ref 4.6–6.5)

## 2014-11-14 LAB — BASIC METABOLIC PANEL
BUN: 13 mg/dL (ref 6–23)
CALCIUM: 9.7 mg/dL (ref 8.4–10.5)
CO2: 32 meq/L (ref 19–32)
Chloride: 103 mEq/L (ref 96–112)
Creatinine, Ser: 0.86 mg/dL (ref 0.40–1.50)
GFR: 99.41 mL/min (ref >60.00)
Glucose, Bld: 134 mg/dL — ABNORMAL HIGH (ref 70–99)
POTASSIUM: 4.2 meq/L (ref 3.5–5.1)
Sodium: 140 mEq/L (ref 135–145)

## 2014-11-14 LAB — LIPID PANEL
Cholesterol: 127 mg/dL (ref 0–200)
HDL: 40.7 mg/dL (ref >39.00)
LDL CALC: 67 mg/dL (ref 0–99)
NonHDL: 86.3
Total CHOL/HDL Ratio: 3
Triglycerides: 96 mg/dL (ref 0.0–149.0)
VLDL: 19.2 mg/dL (ref 0.0–40.0)

## 2014-11-14 MED ORDER — LOSARTAN POTASSIUM 50 MG PO TABS: 50.0000 mg | ORAL_TABLET | Freq: Every day | ORAL | Status: DC

## 2014-11-14 MED ORDER — SIMVASTATIN 40 MG PO TABS: 40.0000 mg | ORAL_TABLET | Freq: Every day | ORAL | Status: DC

## 2014-11-14 MED ORDER — INSULIN ASPART 100 UNIT/ML ~~LOC~~ SOLN: SUBCUTANEOUS | Status: DC

## 2014-11-14 MED ORDER — AMPHETAMINE-DEXTROAMPHET ER 20 MG PO CP24: 20.0000 mg | ORAL_CAPSULE | Freq: Every day | ORAL | Status: DC

## 2014-11-14 MED ORDER — PANTOPRAZOLE SODIUM 40 MG PO TBEC: 40.0000 mg | DELAYED_RELEASE_TABLET | Freq: Every day | ORAL | Status: DC

## 2014-11-14 MED ORDER — ONETOUCH DELICA LANCETS 33G MISC: Status: DC

## 2014-11-14 MED ORDER — BECLOMETHASONE DIPROPIONATE 80 MCG/ACT NA AERS: 4.0000 | INHALATION_SPRAY | Freq: Every day | NASAL | Status: DC

## 2014-11-14 NOTE — Patient Instructions (Signed)

## 2014-11-14 NOTE — Progress Notes (Signed)
Subjective:  Patient ID: Zachary Graham, male    DOB: 11-07-1960  Age: 54 y.o. MRN: [id number]  CC: Diabetes and Hyperlipidemia   HPI Zachary Graham presents for f/up on DM2 and lipids but he compalins of runny nose, nasal congestion, and crackling sound in both ears.  Outpatient Prescriptions Prior to Visit  Medication Sig Dispense Refill  . glucagon (GLUCAGON EMERGENCY) 1 MG injection Inject 1 mg into the muscle once as needed. 2 each prn  . Insulin Infusion Pump Supplies (PARADIGM RESERVOIR 3ML) MISC 1 Device by Does not apply route daily.    . INSULIN SYRINGE 1CC/29G 29G X 1/2" 1 ML MISC 1 Syringe by Does not apply route every 3 (three) days. 10 each 3  . amphetamine-dextroamphetamine (ADDERALL XR) 20 MG 24 hr capsule Take 1 capsule (20 mg total) by mouth daily. 30 capsule 0  . insulin aspart (NOVOLOG) 100 UNIT/ML injection For use in insulin pump, total 250 units per day 30 vial 11  . losartan (COZAAR) 50 MG tablet Take 1 tablet (50 mg total) by mouth daily. 90 tablet 3  . ONETOUCH DELICA LANCETS 95A MISC Use to test blood sugar 8 times daily as instructed. 300 each 11  . pantoprazole (PROTONIX) 40 MG tablet Take 1 tablet (40 mg total) by mouth daily. 90 tablet 2  . simvastatin (ZOCOR) 40 MG tablet Take 1 tablet (40 mg total) by mouth daily. 90 tablet 3  . BAYER MICROLET LANCETS lancets Use 6x a day - for [employer] (Patient not taking: Reported on 11/14/2014) 300 each 11  . Blood Glucose Monitoring Suppl (BAYER CONTOUR NEXT LINK) W/DEVICE KIT Use as advised (Patient not taking: Reported on 11/14/2014) 1 kit 1  . ciprofloxacin (CIPRO) 500 MG tablet Take 1 tablet (500 mg total) by mouth 2 (two) times daily. 60 tablet 1  . Efinaconazole (JUBLIA) 10 % SOLN Apply 1 applicator topically every morning. (Patient not taking: Reported on 11/14/2014) 1 Bottle 3  . glucose blood (BAYER CONTOUR NEXT TEST) test strip Use 6x a day (Patient not taking: Reported on 11/14/2014) 300 each 11  . Lancets  (ONETOUCH ULTRASOFT) lancets Use to test blood sugar 8 times daily as instructed (Patient not taking: Reported on 11/14/2014) 300 each 11   No facility-administered medications prior to visit.    ROS Review of Systems  Constitutional: Negative.  Negative for fever, chills, diaphoresis, appetite change and fatigue.  HENT: Positive for postnasal drip and rhinorrhea. Negative for congestion, ear discharge, ear pain, facial swelling, sneezing, sore throat, tinnitus, trouble swallowing and voice change.   Eyes: Negative.   Respiratory: Negative.  Negative for cough, choking, chest tightness, shortness of breath and stridor.   Cardiovascular: Negative.  Negative for chest pain, palpitations and leg swelling.  Gastrointestinal: Negative.  Negative for nausea, vomiting, abdominal pain, diarrhea and constipation.  Endocrine: Negative.  Negative for polydipsia, polyphagia and polyuria.  Genitourinary: Negative.  Negative for dysuria, urgency, frequency, hematuria, flank pain, decreased urine volume and difficulty urinating.  Musculoskeletal: Negative.   Skin: Negative.  Negative for rash.  Allergic/Immunologic: Negative.   Neurological: Negative.  Negative for dizziness and weakness.  Hematological: Negative.  Negative for adenopathy. Does not bruise/bleed easily.  Psychiatric/Behavioral: Positive for decreased concentration. Negative for suicidal ideas, behavioral problems, sleep disturbance, self-injury, dysphoric mood and agitation. The patient is not nervous/anxious.     Objective:  BP 112/70 mmHg  Pulse 71  Temp(Src) 98.2 F (36.8 C) (Oral)  Resp 16  Ht  5' 9.5" (1.765 m)  Wt 215 lb (97.523 kg)  BMI 31.31 kg/m2  SpO2 98%  BP Readings from Last 3 Encounters:  11/14/14 112/70  11/13/14 126/78  10/04/14 122/82    Wt Readings from Last 3 Encounters:  11/14/14 215 lb (97.523 kg)  11/13/14 204 lb (92.534 kg)  10/04/14 212 lb (96.163 kg)    Physical Exam  Constitutional: He is  oriented to person, place, and time. He appears well-developed and well-nourished. No distress.  HENT:  Head: Normocephalic and atraumatic.  Right Ear: Hearing, tympanic membrane, external ear and ear canal normal.  Left Ear: Hearing, tympanic membrane, external ear and ear canal normal.  Nose: Mucosal edema present. No rhinorrhea, sinus tenderness, nasal deformity, septal deviation or nasal septal hematoma. No epistaxis. Right sinus exhibits no maxillary sinus tenderness and no frontal sinus tenderness. Left sinus exhibits no maxillary sinus tenderness and no frontal sinus tenderness.  Mouth/Throat: Oropharynx is clear and moist and mucous membranes are normal. Mucous membranes are not pale, not dry and not cyanotic. No oral lesions. No trismus in the jaw. No uvula swelling. No oropharyngeal exudate, posterior oropharyngeal edema, posterior oropharyngeal erythema or tonsillar abscesses.  Eyes: Conjunctivae are normal. Right eye exhibits no discharge. Left eye exhibits no discharge. No scleral icterus.  Neck: Normal range of motion. Neck supple. No JVD present. No tracheal deviation present. No thyromegaly present.  Cardiovascular: Normal rate, regular rhythm, normal heart sounds and intact distal pulses.  Exam reveals no gallop and no friction rub.   No murmur heard. Pulmonary/Chest: Effort normal and breath sounds normal. No stridor. No respiratory distress. He has no wheezes. He has no rales. He exhibits no tenderness.  Abdominal: Soft. Bowel sounds are normal. He exhibits no distension and no mass. There is no tenderness. There is no rebound and no guarding.  Musculoskeletal: Normal range of motion. He exhibits no edema or tenderness.  Lymphadenopathy:    He has no cervical adenopathy.  Neurological: He is oriented to person, place, and time.  Skin: Skin is warm and dry. No rash noted. He is not diaphoretic. No erythema. No pallor.  Psychiatric: He has a normal mood and affect. His behavior is  normal. Judgment and thought content normal.  Vitals reviewed.   Lab Results  Component Value Date   WBC 8.1 12/27/2013   HGB 13.4 12/27/2013   HCT 40.4 12/27/2013   PLT 259.0 12/27/2013   GLUCOSE 134* 11/14/2014   CHOL 127 11/14/2014   TRIG 96.0 11/14/2014   HDL 40.70 11/14/2014   LDLDIRECT 132.1 05/26/2012   LDLCALC 67 11/14/2014   ALT 34 12/27/2013   AST 21 12/27/2013   NA 140 11/14/2014   K 4.2 11/14/2014   CL 103 11/14/2014   CREATININE 0.86 11/14/2014   BUN 13 11/14/2014   CO2 32 11/14/2014   TSH 2.54 12/27/2013   PSA 0.53 12/27/2013   HGBA1C 7.6* 11/14/2014   MICROALBUR 0.4 02/05/2013    Dg Chest 2 View  07/25/2013   CLINICAL DATA Cough.  EXAM CHEST  2 VIEW  COMPARISON CT CHEST W/O CM dated 04/20/2012  FINDINGS Mediastinum and hilar structures are normal. Stable heart size, normal pulmonary vascularity. No focal infiltrate. No pleural effusion or pneumothorax. No acute bony abnormality.  IMPRESSION No active cardiopulmonary disease.  SIGNATURE  Electronically Signed   By: Marcello Moores  Register   On: 07/25/2013 15:20    Assessment & Plan:   Zachary Graham was seen today for diabetes and hyperlipidemia.  Diagnoses and all  orders for this visit:  Diabetes 1.5, managed as type 1 - blood sugars are well controlled Orders: -     Basic metabolic panel; Future -     Lipid panel; Future -     Hemoglobin A1c; Future -     losartan (COZAAR) 50 MG tablet; Take 1 tablet (50 mg total) by mouth daily. -     simvastatin (ZOCOR) 40 MG tablet; Take 1 tablet (40 mg total) by mouth daily.  Pure hypercholesterolemia - he ha achieved his LDL goal Orders: -     Lipid panel; Future  Pure hyperglyceridemia - improvement ntoed Orders: -     Lipid panel; Future  ADHD (attention deficit hyperactivity disorder), inattentive type Orders: -     Discontinue: amphetamine-dextroamphetamine (ADDERALL XR) 20 MG 24 hr capsule; Take 1 capsule (20 mg total) by mouth daily. -     Discontinue:  amphetamine-dextroamphetamine (ADDERALL XR) 20 MG 24 hr capsule; Take 1 capsule (20 mg total) by mouth daily. -     amphetamine-dextroamphetamine (ADDERALL XR) 20 MG 24 hr capsule; Take 1 capsule (20 mg total) by mouth daily.  Allergic rhinitis due to pollen - he has AR with ETD, will start a steroid NS  Orders: -     Beclomethasone Dipropionate (QNASL) 80 MCG/ACT AERS; Place 4 puffs into the nose daily.  Other orders -     insulin aspart (NOVOLOG) 100 UNIT/ML injection; For use in insulin pump, total 250 units per day -     pantoprazole (PROTONIX) 40 MG tablet; Take 1 tablet (40 mg total) by mouth daily. -     ONETOUCH DELICA LANCETS 76O MISC; Use to test blood sugar 8 times daily as instructed.  I have discontinued Mr. Bowns onetouch ultrasoft, BAYER CONTOUR NEXT LINK, BAYER MICROLET LANCETS, Efinaconazole, ciprofloxacin, and glucose blood. I am also having him start on Beclomethasone Dipropionate. Additionally, I am having him maintain his PARADIGM RESERVOIR 3ML, INSULIN SYRINGE 1CC/29G, glucagon, insulin aspart, losartan, pantoprazole, simvastatin, ONETOUCH DELICA LANCETS 11X, and amphetamine-dextroamphetamine.  Meds ordered this encounter  Medications  . insulin aspart (NOVOLOG) 100 UNIT/ML injection    Sig: For use in insulin pump, total 250 units per day    Dispense:  30 vial    Refill:  11  . losartan (COZAAR) 50 MG tablet    Sig: Take 1 tablet (50 mg total) by mouth daily.    Dispense:  90 tablet    Refill:  3  . pantoprazole (PROTONIX) 40 MG tablet    Sig: Take 1 tablet (40 mg total) by mouth daily.    Dispense:  90 tablet    Refill:  2  . simvastatin (ZOCOR) 40 MG tablet    Sig: Take 1 tablet (40 mg total) by mouth daily.    Dispense:  90 tablet    Refill:  3  . ONETOUCH DELICA LANCETS 72I MISC    Sig: Use to test blood sugar 8 times daily as instructed.    Dispense:  300 each    Refill:  11  . DISCONTD: amphetamine-dextroamphetamine (ADDERALL XR) 20 MG 24 hr capsule     Sig: Take 1 capsule (20 mg total) by mouth daily.    Dispense:  30 capsule    Refill:  0    Fill on or after 11/16/14  . Beclomethasone Dipropionate (QNASL) 80 MCG/ACT AERS    Sig: Place 4 puffs into the nose daily.    Dispense:  8.7 g    Refill:  11  .  DISCONTD: amphetamine-dextroamphetamine (ADDERALL XR) 20 MG 24 hr capsule    Sig: Take 1 capsule (20 mg total) by mouth daily.    Dispense:  30 capsule    Refill:  0    Fill on or after 12/17/14  . amphetamine-dextroamphetamine (ADDERALL XR) 20 MG 24 hr capsule    Sig: Take 1 capsule (20 mg total) by mouth daily.    Dispense:  30 capsule    Refill:  0    Fill on or after 01/17/15     Follow-up: Return in about 4 months (around 03/16/2015).  Scarlette Calico, MD

## 2014-11-15 ENCOUNTER — Ambulatory Visit: Payer: 59 | Admitting: Internal Medicine

## 2014-12-12 ENCOUNTER — Ambulatory Visit: Payer: 59 | Admitting: *Deleted

## 2014-12-19 ENCOUNTER — Encounter: Payer: 59 | Attending: Internal Medicine | Admitting: *Deleted

## 2014-12-19 VITALS — Ht 70.0 in | Wt 212.6 lb

## 2014-12-19 DIAGNOSIS — Z713 Dietary counseling and surveillance: Secondary | ICD-10-CM | POA: Diagnosis not present

## 2014-12-19 DIAGNOSIS — Z794 Long term (current) use of insulin: Secondary | ICD-10-CM | POA: Diagnosis not present

## 2014-12-19 DIAGNOSIS — E139 Other specified diabetes mellitus without complications: Secondary | ICD-10-CM | POA: Insufficient documentation

## 2014-12-20 ENCOUNTER — Encounter: Payer: Self-pay | Admitting: *Deleted

## 2014-12-20 NOTE — Progress Notes (Signed)
  Pump Follow Up Progress Note  Orders received from MD  giving me permission to make insulin pump adjustments for this patient.  Reviewed blood glucose logs on 12/19/14 via: [facility]  and found the following:               Hypoglycemia Hyperglycemia Comments  Overnight Period:   YES Basal  Pre-Meal:    Breakfast YES YES Hypo:When he traveled out [place] Hyper: BG rise overnight   Lunch      Supper     Post-Meal: Breakfast      Lunch      Supper     Bedtime:       Comments: Average BG over past 2 weeks: 199 mg/dl +/- 92.  Significant rise in BG overnight with average FBG above 200 consistently. He has increased his activity level to running and walking 6 days a week for 45 to 60 minutes and does strength training 3 days a week. He has cut out fried and higher fat foods too. He feels more physically fit, but states his weight is not changing much yet. Plan to increase his Basal Rate at MN per below:  Pump Settings: Date: Current Date: 12/19/14 changes in bold print   Basal Rate: Carb Ratio Sensitivity  Basal Rate: Carb Ratio Sensitivity   MN: 2.00 2.0 10 MN: 2.40  (+) 2.0 10  2 A 2.40 2.4  2 A 2.40 2.4   8 A 3.10 3.1  8 A 3.10 3.1   12 N 2.40 2.4  12 N 2.40 2.4   5 P 2.40 2.4  5 P 2.40 2.4                      Plan: Patient to continue to use Bolus Wizard via [employer] insulin pump. Recommend he check BG more often so he can provide correction bolus more quickly when BG is too high. He plans to be trained on Dexcom CGM tomorrow at the [place] so I look forward to seeing that data to be able to make further adjustments as needed.   Follow up: with Dr. Cruzita Lederer as planned and let me know as needed for additional support. Recommend the DM 1 Support Group when able to attend

## 2015-01-06 ENCOUNTER — Ambulatory Visit (INDEPENDENT_AMBULATORY_CARE_PROVIDER_SITE_OTHER): Payer: 59 | Admitting: Internal Medicine

## 2015-01-06 ENCOUNTER — Encounter: Payer: Self-pay | Admitting: Internal Medicine

## 2015-01-06 VITALS — BP 110/74 | HR 74 | Temp 98.4°F | Wt 216.0 lb

## 2015-01-06 DIAGNOSIS — E139 Other specified diabetes mellitus without complications: Secondary | ICD-10-CM

## 2015-01-06 NOTE — Progress Notes (Signed)
Patient ID: Zachary Graham, male   DOB: 1961/03/07, 54 y.o.   MRN: [id number]  HPI: Zachary Graham is a 54 y.o.-year-old male, returning for f/u for DM1, dx 1993, insulin-dependent, uncontrolled, with complications (mild sensory peripheral neuropathy; hypoglycemia episodes). Last visit 1.5 mo ago.  He started on Aderrall last year. He started to lose weight and his sugars improved afterwards.  He is running now - 3-5 mi at a time.   He is very busy at work, and many times comes home at 1 AM. He does not have too much time to eat at work.  Last hemoglobin A1c was: Lab Results  Component Value Date   HGBA1C 7.6* 11/14/2014   HGBA1C 8.2* 07/17/2014   HGBA1C 8.1* 11/20/2013  04/05/2014: HbA1c 7.3%  Pt is on an insulin pump since 2006, currently on Paradigm 751.  He had an Enlite CGM (not using - feels readings were not accurate). He started on a Dexcom CGM since last visit. He loves it! Has Novolog in pump.  He tried Metformin in the past >> did not help  Pump settings: - Basal rates: 12 AM to 2 AM: 2.0 >> 1.95 2 AM to 5 AM: 2.4 >> 2.15 5 AM to 8 AM: 2.4 >> 2.20 8 AM to 12 PM: 3.10 >> 2.60 12 PM to 5 PM: 2.4 >> 1.80 5 PM-12 AM: 2.4 >> 2.10 - bolus: - insulin to carb ratio:  12 AM to 2 AM: 2.0  2 AM to 8 AM: 2.4 >> 2.01.8 8 AM to 12 PM: 3.1 >> 2.0 12 PM to 5 PM: 2.4 >> 1.5 5 PM-12 AM: 2.4 >> 1.5 - ISF: 10 - target: 100-120 - IOB: 4h TDD basal: 52 % TDD bolus: 48 % TDD 107 +/- 16 units - Bolus wizard: On - Modified bolusing: no - Changes the pump site: every 2 days  Pt checks his sugars 2-3 a day- ave 188+/-96 >> 227 +/- 84 >> 154 +/- 73  >> 154 +/- 57 now - sugars still highest at night - still fluctuating:   Previously:   - am: 186-274 >> 58-200 >> 59x1, 103-287, 333 >> 79-183, 324 >> 97-334, 394 >> 59-272 >> 61-196 - 8-9 am: 188-313 >> 83-168 >> 50x1, 107-270, 320 >> 145, 186-332 >>165, 367 >> 92-242 >> 101-282 - noon: 210, 217 >> 95-263 (369x1) >> 63, 81, 200-238 >>  58-239 >> 125, 167 >> 81-133 >> 49x1, 105-219 - before dinner:142-291 >> 70-195 >> 57, 129-197 >> 150-172 >> 102-265 >> 81-153, 269 >> 57-233 - after dinner: 126-313 >> 140-301 >> 68-142, 344 >> 48X1, 90-204 - bedtime (12 am): 147-288 >> 65-204 (316 x1) >> 108-295 >> 162-303 >> see above >> 62-181 - 2 am: 147-308 >> 69-300 >> 158-370  >> 99, 190-247 >> 217, 242 >> 62-230 >> 162-211 - 4 am: 144-360 >> 146-293 >> 149-235 >> 188-241 >> see above >> 256, 360 >> 79-234, 369 >> 147-233, 301  He has a history of severe hypoglycemia episodes in 2015. Usually these happen with exercise or in PM. He never had DKA.  - no CKD, last BUN/creatinine:  Lab Results  Component Value Date   BUN 13 11/14/2014   CREATININE 0.86 11/14/2014  he is on Cozaar. - last set of lipids: Lab Results  Component Value Date   CHOL 127 11/14/2014   HDL 40.70 11/14/2014   LDLCALC 67 11/14/2014   LDLDIRECT 132.1 05/26/2012   TRIG 96.0 11/14/2014   CHOLHDL 3 11/14/2014  he is on Zocor. - last eye exam was in 09/2013 -  No DR. - + numbness and tingling in his feet. He will see a podiatrist next week.  He also has a history of GERD, hypertension, hyperlipidemia, OSA.   I reviewed pt's medications, allergies, PMH, social hx, family hx, and changes were documented in the history of present illness. Otherwise, unchanged from my initial visit note.  ROS: Constitutional: no weight loss, no fatigue, no subjective hyperthermia/hypothermia Eyes: no blurry vision, no xerophthalmia ENT: no sore throat, no nodules palpated in throat, no dysphagia/odynophagia, no hoarseness Cardiovascular: no CP/SOB/palpitations/no leg swelling Respiratory: no cough/SOB Gastrointestinal: no N/V/D/C Musculoskeletal: no muscle/joint aches Skin: no rashes Neurological: no tremors/numbness/tingling/dizziness  PE: BP 110/74 mmHg  Pulse 74  Temp(Src) 98.4 F (36.9 C)  Wt 216 lb (97.977 kg) Body mass index is 30.99 kg/(m^2).  Wt Readings  from Last 3 Encounters:  01/06/15 216 lb (97.977 kg)  12/19/14 212 lb 9.6 oz (96.435 kg)  11/14/14 215 lb (97.523 kg)   Constitutional: obese, in NAD Eyes: PERRLA, EOMI, no exophthalmos ENT: moist mucous membranes, no thyromegaly, no cervical lymphadenopathy Cardiovascular: RRR, No MRG Respiratory: CTA B Gastrointestinal: abdomen soft, NT, ND, BS+ Musculoskeletal: no deformities, strength intact in all 4 Skin: moist, warm, no rashes  ASSESSMENT: 1. DM2, insulin-dependent, uncontrolled, without complications - on pump - On CGM - Dexcom. He had an Enlite before, which found less accurate  - Barriers to good control:  Very busy schedule >> no time to check sugars and bolus during the day  Lack of sleep >> sometimes sleeps 3h a night (!) and tries to catch up in the weekend  Reaches home late at night  - sometimes at 1 am >> eats most of the daily calories then  - sometimes forgets to bolus and boluses after he eats  PLAN:  1. Patient with long-standing type 1 diabetes, on an insulin pump. His weight and sugars improved since last visit as he is more active >> running 3 miles 3x a week and 5 miles 1x a week (6 am). He started to develop low blood sugars I believe is a consequence of his aerobic exercise >> he decreased his basal rates. To avoid lows, he also decreased his insulin to carb ratios. I explained that by doing this, he actually obtained the opposite effect, increasing his mealtime bolusing. However, this was the right thing to do, since he still had large increases in his blood sugars after a meal.   - He continues to have more variable, but usually higher sugars over night as he gets home late at night and he eats repeatedly after he gets home. - We also discussed about CBG changes during and after exercise. He usually disconnects the pump during exercise, but he may drop his sugars afterwards, therefore, I advised him to do a temporary basal rate of 80% for 2 hours after  exercise. He may need to decrease the percentage or increase the time of the temporary basal rate depending on his sugars - since he still has large Increases if sugars after a meal, which then he needs to correct, I will decrease his ICR even further - I advised him to:  Patient Instructions   - Basal rates: 12 AM to 2 AM: 1.95 2 AM to 5 AM: 2.15 5 AM to 8 AM: 2.20 8 AM to 12 PM: 2.60 12 PM to 5 PM: 1.80 5 PM-12 AM: 2.10 - bolus: - insulin to carb  ratio:  12 AM to 11 AM: 2.0 >> 1.8 11 AM to 5 PM: 2.2 >> 2.0 5 PM-12 AM: 1.5  - ISF: 10 - target: 100-120 - IOB: 4h  - Needs another hemoglobin A1c at next visit - Return in about 3 months (around 04/08/2015).   - time spent with the patient: 40 min, of which >50% was spent in reviewing his pump download, reviewing CGM patterns, discussing his hypo- and hyper-glycemic episodes, reviewing previous labs and pump settings and developing a plan to avoid hypo- an hyper-glycemia.

## 2015-01-06 NOTE — Patient Instructions (Signed)
-   Basal rates: 12 AM to 2 AM: 1.95 2 AM to 5 AM: 2.15 5 AM to 8 AM: 2.20 8 AM to 12 PM: 2.60 12 PM to 5 PM: 1.80 5 PM-12 AM: 2.10 - bolus: - insulin to carb ratio:  12 AM to 11 AM: 2.0 >> 1.8 11 AM to 5 PM: 2.2 >> 2.0 5 PM-12 AM: 1.5  - ISF: 10 - target: 100-120 - IOB: 4h

## 2015-01-09 ENCOUNTER — Encounter: Payer: 59 | Admitting: *Deleted

## 2015-01-09 DIAGNOSIS — E139 Other specified diabetes mellitus without complications: Secondary | ICD-10-CM

## 2015-01-14 ENCOUNTER — Ambulatory Visit (INDEPENDENT_AMBULATORY_CARE_PROVIDER_SITE_OTHER): Payer: 59 | Admitting: Internal Medicine

## 2015-01-14 ENCOUNTER — Other Ambulatory Visit (INDEPENDENT_AMBULATORY_CARE_PROVIDER_SITE_OTHER): Payer: 59

## 2015-01-14 ENCOUNTER — Encounter: Payer: Self-pay | Admitting: Internal Medicine

## 2015-01-14 VITALS — BP 126/84 | HR 75 | Temp 98.3°F | Resp 16 | Ht 70.0 in | Wt 215.0 lb

## 2015-01-14 DIAGNOSIS — E139 Other specified diabetes mellitus without complications: Secondary | ICD-10-CM

## 2015-01-14 DIAGNOSIS — Z0000 Encounter for general adult medical examination without abnormal findings: Secondary | ICD-10-CM

## 2015-01-14 DIAGNOSIS — E7800 Pure hypercholesterolemia, unspecified: Secondary | ICD-10-CM

## 2015-01-14 DIAGNOSIS — F900 Attention-deficit hyperactivity disorder, predominantly inattentive type: Secondary | ICD-10-CM

## 2015-01-14 DIAGNOSIS — E781 Pure hyperglyceridemia: Secondary | ICD-10-CM | POA: Diagnosis not present

## 2015-01-14 LAB — BASIC METABOLIC PANEL
BUN: 14 mg/dL (ref 6–23)
CALCIUM: 9.4 mg/dL (ref 8.4–10.5)
CO2: 30 meq/L (ref 19–32)
Chloride: 103 mEq/L (ref 96–112)
Creatinine, Ser: 0.82 mg/dL (ref 0.40–1.50)
GFR: 104.96 mL/min (ref >60.00)
Glucose, Bld: 136 mg/dL — ABNORMAL HIGH (ref 70–99)
POTASSIUM: 4.3 meq/L (ref 3.5–5.1)
SODIUM: 139 meq/L (ref 135–145)

## 2015-01-14 LAB — URINALYSIS, ROUTINE W REFLEX MICROSCOPIC
Bilirubin Urine: NEGATIVE
Hgb urine dipstick: NEGATIVE
KETONES UR: NEGATIVE
Leukocytes, UA: NEGATIVE
Nitrite: NEGATIVE
RBC / HPF: NONE SEEN (ref 0–?)
SPECIFIC GRAVITY, URINE: 1.015 (ref 1.000–1.030)
Total Protein, Urine: NEGATIVE
UROBILINOGEN UA: 0.2 (ref 0.0–1.0)
Urine Glucose: NEGATIVE
pH: 7.5 (ref 5.0–8.0)

## 2015-01-14 LAB — FECAL OCCULT BLOOD, GUAIAC: FECAL OCCULT BLD: NEGATIVE

## 2015-01-14 LAB — CK: Total CK: 129 U/L (ref 7–232)

## 2015-01-14 LAB — PSA: PSA: 0.48 ng/mL (ref 0.10–4.00)

## 2015-01-14 LAB — MICROALBUMIN / CREATININE URINE RATIO
CREATININE, U: 198.8 mg/dL
MICROALB UR: 0.7 mg/dL (ref 0.0–1.9)
Microalb Creat Ratio: 0.4 mg/g (ref 0.0–30.0)

## 2015-01-14 NOTE — Patient Instructions (Signed)

## 2015-01-14 NOTE — Progress Notes (Signed)
Subjective:  Patient ID: Zachary Graham, male    DOB: Mar 16, 1961  Age: 54 y.o. MRN: [id number]  CC: Annual Exam   HPI Zachary Graham presents for an annual exam. His only complaint is that he has leg cramps at night when he is sleeping.  Outpatient Prescriptions Prior to Visit  Medication Sig Dispense Refill  . amphetamine-dextroamphetamine (ADDERALL XR) 20 MG 24 hr capsule Take 1 capsule (20 mg total) by mouth daily. 30 capsule 0  . Beclomethasone Dipropionate (QNASL) 80 MCG/ACT AERS Place 4 puffs into the nose daily. 8.7 g 11  . glucagon (GLUCAGON EMERGENCY) 1 MG injection Inject 1 mg into the muscle once as needed. 2 each prn  . insulin aspart (NOVOLOG) 100 UNIT/ML injection For use in insulin pump, total 250 units per day 30 vial 11  . Insulin Infusion Pump Supplies (PARADIGM RESERVOIR 3ML) MISC 1 Device by Does not apply route daily.    . INSULIN SYRINGE 1CC/29G 29G X 1/2" 1 ML MISC 1 Syringe by Does not apply route every 3 (three) days. 10 each 3  . losartan (COZAAR) 50 MG tablet Take 1 tablet (50 mg total) by mouth daily. 90 tablet 3  . ONETOUCH DELICA LANCETS 49F MISC Use to test blood sugar 8 times daily as instructed. 300 each 11  . pantoprazole (PROTONIX) 40 MG tablet Take 1 tablet (40 mg total) by mouth daily. 90 tablet 2  . simvastatin (ZOCOR) 40 MG tablet Take 1 tablet (40 mg total) by mouth daily. 90 tablet 3   No facility-administered medications prior to visit.    ROS Review of Systems  Constitutional: Negative.  Negative for fever, chills, diaphoresis, appetite change and fatigue.  HENT: Negative.   Eyes: Negative.   Respiratory: Negative.  Negative for cough, choking, chest tightness, shortness of breath and stridor.   Cardiovascular: Negative.  Negative for chest pain, palpitations and leg swelling.  Gastrointestinal: Negative.  Negative for nausea, vomiting, abdominal pain, diarrhea, constipation and blood in stool.  Endocrine: Negative.  Negative for polydipsia,  polyphagia and polyuria.  Genitourinary: Negative.  Negative for dysuria, urgency, frequency, hematuria, discharge, enuresis, difficulty urinating and testicular pain.  Musculoskeletal: Negative.  Negative for myalgias, back pain, joint swelling and arthralgias.  Skin: Negative.   Allergic/Immunologic: Negative.   Neurological: Negative.  Negative for dizziness, speech difficulty, weakness and light-headedness.  Hematological: Negative.  Negative for adenopathy. Does not bruise/bleed easily.  Psychiatric/Behavioral: Negative.     Objective:  BP 126/84 mmHg  Pulse 75  Temp(Src) 98.3 F (36.8 C) (Oral)  Resp 16  Ht 5\' 10"  (1.778 m)  Wt 215 lb (97.523 kg)  BMI 30.85 kg/m2  SpO2 97%  BP Readings from Last 3 Encounters:  01/14/15 126/84  01/06/15 110/74  11/14/14 112/70    Wt Readings from Last 3 Encounters:  01/14/15 215 lb (97.523 kg)  01/06/15 216 lb (97.977 kg)  12/19/14 212 lb 9.6 oz (96.435 kg)    Physical Exam  Constitutional: He is oriented to person, place, and time. He appears well-developed and well-nourished. No distress.  HENT:  Head: Normocephalic and atraumatic.  Mouth/Throat: Oropharynx is clear and moist. No oropharyngeal exudate.  Eyes: Pupils are equal, round, and reactive to light. Right eye exhibits no discharge. Left eye exhibits no discharge. No scleral icterus.  Neck: Normal range of motion. Neck supple. No JVD present. No tracheal deviation present. No thyromegaly present.  Cardiovascular: Normal rate, regular rhythm, normal heart sounds and intact distal pulses.  Exam  reveals no gallop and no friction rub.   No murmur heard. Pulmonary/Chest: Effort normal and breath sounds normal. No stridor. No respiratory distress. He has no wheezes. He has no rales. He exhibits no tenderness.  Abdominal: Soft. Bowel sounds are normal. He exhibits no distension and no mass. There is no tenderness. There is no rebound and no guarding. Hernia confirmed negative in the  right inguinal area and confirmed negative in the left inguinal area.  Genitourinary: Rectum normal, testes normal and penis normal. Rectal exam shows no external hemorrhoid, no internal hemorrhoid, no fissure, no mass, no tenderness and anal tone normal. Guaiac negative stool. Prostate is enlarged (1+ smooth symm BPH). Prostate is not tender. Right testis shows no mass, no swelling and no tenderness. Right testis is descended. Left testis shows no mass, no swelling and no tenderness. Left testis is descended. Circumcised. No penile erythema or penile tenderness. No discharge found.  Musculoskeletal: Normal range of motion. He exhibits no edema or tenderness.  Lymphadenopathy:    He has no cervical adenopathy.       Right: No inguinal adenopathy present.       Left: No inguinal adenopathy present.  Neurological: He is oriented to person, place, and time. He has normal reflexes. He displays normal reflexes. No cranial nerve deficit. He exhibits normal muscle tone. Coordination normal.  Skin: Skin is warm and dry. No rash noted. He is not diaphoretic. No erythema. No pallor.  Psychiatric: He has a normal mood and affect. His behavior is normal. Judgment and thought content normal.  Vitals reviewed.   Lab Results  Component Value Date   WBC 8.1 12/27/2013   HGB 13.4 12/27/2013   HCT 40.4 12/27/2013   PLT 259.0 12/27/2013   GLUCOSE 136* 01/14/2015   CHOL 127 11/14/2014   TRIG 96.0 11/14/2014   HDL 40.70 11/14/2014   LDLDIRECT 132.1 05/26/2012   LDLCALC 67 11/14/2014   ALT 34 12/27/2013   AST 21 12/27/2013   NA 139 01/14/2015   K 4.3 01/14/2015   CL 103 01/14/2015   CREATININE 0.82 01/14/2015   BUN 14 01/14/2015   CO2 30 01/14/2015   TSH 2.54 12/27/2013   PSA 0.48 01/14/2015   HGBA1C 7.6* 11/14/2014   MICROALBUR 0.7 01/14/2015    Dg Chest 2 View  07/25/2013   CLINICAL DATA Cough.  EXAM CHEST  2 VIEW  COMPARISON CT CHEST W/O CM dated 04/20/2012  FINDINGS Mediastinum and hilar  structures are normal. Stable heart size, normal pulmonary vascularity. No focal infiltrate. No pleural effusion or pneumothorax. No acute bony abnormality.  IMPRESSION No active cardiopulmonary disease.  SIGNATURE  Electronically Signed   By: Marcello Moores  Register   On: 07/25/2013 15:20    Assessment & Plan:   Gerod was seen today for annual exam.  Diagnoses and all orders for this visit:  Routine general medical examination at a health care facility- his physical exam is been completed and is unremarkable. His vaccines were reviewed and updated. Labs ordered to screen for prostate cancer. He was given patient education material. -     Urinalysis, Routine w reflex microscopic (not at [facility]); Future -     PSA; Future -     Basic metabolic panel; Future  Pure hypercholesterolemia- he has achieved his LDL goal and his CPK is not elevated. I have asked him to try a bar of Ivory soap under his sheets at night to help with the leg cramps. -     CK; Future  Pure hyperglyceridemia -     CK; Future  Diabetes 1.5, managed as type 1- insulin pump managed by endocrinology -     Microalbumin / creatinine urine ratio; Future -     Basic metabolic panel; Future  ADHD (attention deficit hyperactivity disorder), inattentive type- this was refilled today.   I am having Mr. Delapena maintain his PARADIGM RESERVOIR 3ML, INSULIN SYRINGE 1CC/29G, glucagon, insulin aspart, losartan, pantoprazole, simvastatin, ONETOUCH DELICA LANCETS 16X, Beclomethasone Dipropionate, and amphetamine-dextroamphetamine.  No orders of the defined types were placed in this encounter.     Follow-up: Return in about 6 months (around 07/15/2015).  Scarlette Calico, MD

## 2015-01-14 NOTE — Progress Notes (Signed)
Pre visit review using our clinic review tool, if applicable. No additional management support is needed unless otherwise documented below in the visit note. 

## 2015-01-16 NOTE — Progress Notes (Signed)
CGM Follow up visit Appt start time 1200  End time 1230  Patient here for follow up to starting on Dexcom CGM.  He was able to change out the sensor and start a new one without assistance from me.  He states he is doing well with the alerts and increased information he is getting from the CGM. No further questions or problems.

## 2015-01-30 ENCOUNTER — Ambulatory Visit: Payer: 59 | Admitting: *Deleted

## 2015-01-30 ENCOUNTER — Other Ambulatory Visit: Payer: Self-pay | Admitting: *Deleted

## 2015-01-30 VITALS — BP 120/70 | Wt 216.6 lb

## 2015-01-30 DIAGNOSIS — E139 Other specified diabetes mellitus without complications: Secondary | ICD-10-CM

## 2015-01-30 LAB — POCT GLYCOSYLATED HEMOGLOBIN (HGB A1C): HEMOGLOBIN A1C: 7.6

## 2015-01-31 ENCOUNTER — Encounter: Payer: Self-pay | Admitting: *Deleted

## 2015-01-31 NOTE — Patient Outreach (Signed)
[facility]) Care Management   01/30/2015  ZAHEER WAGEMAN 1961-05-06 [id number]  Zachary Graham is an 54 y.o. male who presents to the [place] office for routine Link To Wellness follow up for self management assistance with Type I DM, HTN, and hyperlipidemia.  Subjective:  Hannah says he is doing well, continues to work long hours but likes his job. He continues to wear a Medtronic insulin pump and has been  wearing a Dexcom continuous glucose monitor for about a month. He says he is pleased with the monitor as it helps him avoid lows and extreme highs. He says he exercises 6x week - combining running 3-5 miles or working out with a trainer through the [employer] he is still having lows and identifies his threshold at a CBG or sensor reading of 55. He says he continues to follow the [employer] meal plan and no longer consumes fried foods.  Objective:   Review of Systems  Constitutional: Negative.     Physical Exam  Constitutional: He appears well-developed and well-nourished.  Musculoskeletal: Normal range of motion.  Skin: Skin is warm and dry.  Psychiatric: He has a normal mood and affect. His behavior is normal. Judgment and thought content normal.   Filed Vitals:   01/30/15 1559  BP: 120/70   Filed Weights   01/30/15 1559  Weight: 216 lb 9.6 oz (98.249 kg)  POC A1C= 7.5%  Current Medications:   Current Outpatient Prescriptions  Medication Sig Dispense Refill  . amphetamine-dextroamphetamine (ADDERALL XR) 20 MG 24 hr capsule Take 1 capsule (20 mg total) by mouth daily. 30 capsule 0  . Beclomethasone Dipropionate (QNASL) 80 MCG/ACT AERS Place 4 puffs into the nose daily. 8.7 g 11  . glucagon (GLUCAGON EMERGENCY) 1 MG injection Inject 1 mg into the muscle once as needed. 2 each prn  . insulin aspart (NOVOLOG) 100 UNIT/ML injection For use in insulin pump, total 250 units per day 30 vial 11  . Insulin Infusion Pump Supplies (PARADIGM  RESERVOIR 3ML) MISC 1 Device by Does not apply route daily.    . INSULIN SYRINGE 1CC/29G 29G X 1/2" 1 ML MISC 1 Syringe by Does not apply route every 3 (three) days. 10 each 3  . losartan (COZAAR) 50 MG tablet Take 1 tablet (50 mg total) by mouth daily. 90 tablet 3  . ONETOUCH DELICA LANCETS 46N MISC Use to test blood sugar 8 times daily as instructed. 300 each 11  . pantoprazole (PROTONIX) 40 MG tablet Take 1 tablet (40 mg total) by mouth daily. 90 tablet 2  . simvastatin (ZOCOR) 40 MG tablet Take 1 tablet (40 mg total) by mouth daily. 90 tablet 3   No current facility-administered medications for this visit.    Functional Status:   In your present state of health, do you have any difficulty performing the following activities: 01/30/2015  Hearing? N  Vision? N  Difficulty concentrating or making decisions? N  Walking or climbing stairs? N  Dressing or bathing? N  Doing errands, shopping? N    Fall/Depression Screening:    PHQ 2/9 Scores 01/30/2015 01/16/2015 12/20/2014 06/26/2014 07/25/2013 06/26/2012  PHQ - 2 Score 0 0 0 0 0 0    Assessment:   [employer] employee and Link To Wellness member, not meeting the treatment A1C target of <7.0% :  meeting treatment targets for HTN and hyperlipidemia (most recent lipid profile on 11/14/14 was normal) at each assessment.  Plan:  [facility] CM Care Plan Problem One        Most Recent Value   Care Plan Problem One  Patient with longstanding Type 1.5 DM being managed as Type I with current POC A1C= 7.5% on 01/30/15   Role Documenting the Problem One  Care Management [place] for Problem One  Active   [facility] Long Term Goal (31-90 days)  Improved Control of Type I DM as evidenced by improved A1C at the next assessment without increased episodes of hypoglycemia ( no more than 4 per week)   [facility] Long Term Goal Start Date  01/30/15   Interventions for Problem One Long Term Goal  discussed strategies to improve blood sugar control and reduce  frequency of lows including reducing basal rate for 2 hours post exercise and  increasing bolus amounts for excessive  spikes (>200) , identified his hypoglycemia threshold and reviewed how to treat hypoglycemia (rule of 15s), positive reinforcement given to [facility] for his participation in a consistent exercise program and discussed how exercise improves insulin sensitivity, reviewed results of labs drawn on 8/30 including kidney functions and PSA and microalbuminuria, will arrange for Link To Wellness follow up after he sees Dr. Cruzita Lederer on  04/04/15       Barrington Ellison RN,CCM,CDE [place] Management Coordinator Link To Wellness Office Phone [phone] Office Fax [phone]

## 2015-02-02 ENCOUNTER — Encounter: Payer: Self-pay | Admitting: Internal Medicine

## 2015-03-05 ENCOUNTER — Other Ambulatory Visit: Payer: Self-pay | Admitting: Internal Medicine

## 2015-03-05 ENCOUNTER — Encounter: Payer: Self-pay | Admitting: Internal Medicine

## 2015-03-05 DIAGNOSIS — M25569 Pain in unspecified knee: Secondary | ICD-10-CM

## 2015-03-05 DIAGNOSIS — Z1159 Encounter for screening for other viral diseases: Secondary | ICD-10-CM

## 2015-03-05 DIAGNOSIS — Z0000 Encounter for general adult medical examination without abnormal findings: Secondary | ICD-10-CM

## 2015-03-05 NOTE — Telephone Encounter (Signed)
Lab put in. Please advise on Referral

## 2015-03-10 ENCOUNTER — Other Ambulatory Visit: Payer: 59

## 2015-03-10 ENCOUNTER — Ambulatory Visit (INDEPENDENT_AMBULATORY_CARE_PROVIDER_SITE_OTHER): Payer: 59

## 2015-03-10 DIAGNOSIS — Z23 Encounter for immunization: Secondary | ICD-10-CM | POA: Diagnosis not present

## 2015-03-10 DIAGNOSIS — Z1159 Encounter for screening for other viral diseases: Secondary | ICD-10-CM

## 2015-03-11 ENCOUNTER — Encounter: Payer: Self-pay | Admitting: Internal Medicine

## 2015-03-11 LAB — HEPATITIS C ANTIBODY: HCV AB: NEGATIVE

## 2015-03-18 ENCOUNTER — Other Ambulatory Visit: Payer: Self-pay | Admitting: Internal Medicine

## 2015-03-18 ENCOUNTER — Encounter: Payer: Self-pay | Admitting: Internal Medicine

## 2015-03-19 ENCOUNTER — Encounter: Payer: Self-pay | Admitting: Family Medicine

## 2015-03-19 ENCOUNTER — Other Ambulatory Visit: Payer: Self-pay | Admitting: Internal Medicine

## 2015-03-19 ENCOUNTER — Other Ambulatory Visit: Payer: Self-pay

## 2015-03-19 ENCOUNTER — Other Ambulatory Visit (INDEPENDENT_AMBULATORY_CARE_PROVIDER_SITE_OTHER): Payer: 59

## 2015-03-19 ENCOUNTER — Ambulatory Visit (INDEPENDENT_AMBULATORY_CARE_PROVIDER_SITE_OTHER): Payer: 59 | Admitting: Family Medicine

## 2015-03-19 VITALS — BP 128/72 | HR 64 | Ht 70.0 in | Wt 221.0 lb

## 2015-03-19 DIAGNOSIS — M7632 Iliotibial band syndrome, left leg: Secondary | ICD-10-CM

## 2015-03-19 DIAGNOSIS — F900 Attention-deficit hyperactivity disorder, predominantly inattentive type: Secondary | ICD-10-CM

## 2015-03-19 DIAGNOSIS — M222X2 Patellofemoral disorders, left knee: Secondary | ICD-10-CM

## 2015-03-19 DIAGNOSIS — M25562 Pain in left knee: Secondary | ICD-10-CM

## 2015-03-19 MED ORDER — AMPHETAMINE-DEXTROAMPHET ER 20 MG PO CP24: 20.0000 mg | ORAL_CAPSULE | Freq: Every day | ORAL | Status: DC

## 2015-03-19 NOTE — Progress Notes (Signed)
[name] [address] [place], [place] [address] Phone: [phone] Subjective:    I'm seeing this patient by the request  of:  Scarlette Calico, MD   CC: Knee pain  [id number] Zachary Graham is a 54 y.o. male coming in with complaint of knee pain. This is on the left side. Patient has started to be more active recently. Has started running. Patient states that with running he noticed some mild lateral knee pain. Patient states that he is always ran differently. Patient states though that distal liver affect his legs previously. Noticing a more pain on the lateral aspect of the knees now even with regular daily activity. Patient states that going up or downstairs especially down stairs can be difficult. States that the pain at night can be painful as well. Describes it as more of a 4 out of 10. Consider an more of a tight sensation. No numbness. Patient states that it is stopped him from running on medications but usually does not stop his daily activities. Has taken anti-inflammatories which has been helpful. Does not remember any true injury.     Past Medical History  Diagnosis Date  . Diabetes mellitus   . GERD (gastroesophageal reflux disease)   . Hypertension   . Hyperlipidemia   . OSA (obstructive sleep apnea)    Past Surgical History  Procedure Laterality Date  . Tonsillectomy     Social History  Substance Use Topics  . Smoking status: Never Smoker   . Smokeless tobacco: Never Used  . Alcohol Use: No   Allergies  Allergen Reactions  . Zetia [Ezetimibe]     abd cramps  . Lisinopril   . Penicillins   . Shellfish Allergy    Family History  Problem Relation Age of Onset  . Colon cancer Maternal Grandfather   . Colon cancer Paternal Grandfather   . Alcohol abuse Neg Hx   . Asthma Neg Hx   . Diabetes Neg Hx   . Heart disease Father     Valve replacement; pacemaker  . Hyperlipidemia Neg Hx   . Hypertension Neg Hx   . Kidney disease  Neg Hx   . Stroke Neg Hx      Past medical history, social, surgical and family history all reviewed in electronic medical record.   Review of Systems: No headache, visual changes, nausea, vomiting, diarrhea, constipation, dizziness, abdominal pain, skin rash, fevers, chills, night sweats, weight loss, swollen lymph nodes, body aches, joint swelling, muscle aches, chest pain, shortness of breath, mood changes.   Objective Blood pressure 128/72, pulse 64, height 5\' 10"  (1.778 m), weight 221 lb (100.245 kg), SpO2 99 %.  General: No apparent distress alert and oriented x3 mood and affect normal, dressed appropriately.  HEENT: Pupils equal, extraocular movements intact  Respiratory: Patient's speak in full sentences and does not appear short of breath  Cardiovascular: No lower extremity edema, non tender, no erythema  Skin: Warm dry intact with no signs of infection or rash on extremities or on axial skeleton.  Abdomen: Soft nontender  Neuro: Cranial nerves II through XII are intact, neurovascularly intact in all extremities with 2+ DTRs and 2+ pulses.  Lymph: No lymphadenopathy of posterior or anterior cervical chain or axillae bilaterally.  Gait patient can externally rotate leg 180 almost. Does ambulate with external rotation of the legs MSK:  Non tender with full range of motion and good stability and symmetric strength and tone of shoulders, elbows, wrist, hip,  and ankles bilaterally.  Knee: Left Normal to inspection with no erythema or effusion or obvious bony abnormalities. Mild lateral tilt of the patella Palpation normal with no warmth, joint line tenderness, patellar tenderness, or condyle tenderness. ROM full in flexion and extension and lower leg rotation. Ligaments with solid consistent endpoints including ACL, PCL, LCL, MCL. Negative Mcmurray's, Apley's, and Thessalonian tests. Mild painful patellar compression. Patellar glide with minimal crepitus. Patellar and quadriceps  tendons unremarkable. Hamstring and quadriceps strength is normal.  Contralateral knee has the same patellar tilt but nontender.  MSK US performed of: Left knee This study was ordered, performed, and interpreted by [name] D.O.  Knee: All structures visualized. Anteromedial, anterolateral, posteromedial, and posterolateral menisci unremarkable without tearing, fraying, effusion, or displacement. Patellar Tendon unremarkable on long and transverse views without effusion. No abnormality of prepatellar bursa. LCL and MCL unremarkable on long and transverse views. No abnormality of origin of medial or lateral head of the gastrocnemius. She does have some distal iliotibial band thickening  IMPRESSION:  Distal iliotibial band thickening  Procedure note 97110; 15 minutes spent for Therapeutic exercises as stated in above notes.  This included exercises focusing on stretching, strengthening, with significant focus on eccentric aspects. Patellofemoral Syndrome  Reviewed anatomy using anatomical model and how PFS occurs.  Given rehab exercises handout for VMO, hip abductors, core, entire kinetic chain including proprioception exercises including cone touches, step downs, hip elevations and turn outs.  Could benefit from PT, regular exercise, upright biking, and a PFS knee brace to assist with tracking abnormalities.   Proper technique shown and discussed handout in great detail with ATC.  All questions were discussed and answered.       Impression and Recommendations:     This case required medical decision making of moderate complexity.

## 2015-03-19 NOTE — Patient Instructions (Signed)
Good to see you.  Ice 20 minutes 2 times daily. Usually after activity and before bed. Exercises 3 times a week.  Alternate handouts Run only 2 times a week for next 3-4 weeks.  pennsaid pinkie amount topically 2 times daily as needed.  Vitamin D 2000 IU daily Iron 65mg  daily with vitamin C 500mg  daily Turmeric 500mg  twice daily See me again in 4 weeks

## 2015-03-19 NOTE — Assessment & Plan Note (Signed)
Patient does have severe rotation externally of the hips that I think is secondary to probably a hip dysplasia. Patient does not have this in any other joints so likely not a benign hypermobility syndrome. This would increase the amount of discomfort on the iliotibial band and we do see thickening noted on ultrasound today. Patient given exercises and work with Product/process development scientist today. We discussed icing protocol. Patient can try topical anti-inflammatory's. We discussed strengthening and hip abductors. Patient and will come back and see me again in 3-4 weeks to make sure he is improving.

## 2015-03-19 NOTE — Progress Notes (Signed)
Pre visit review using our clinic review tool, if applicable. No additional management support is needed unless otherwise documented below in the visit note. 

## 2015-03-19 NOTE — Assessment & Plan Note (Signed)
Patellofemoral Syndrome  Reviewed anatomy using anatomical model and how PFS occurs.  Given rehab exercises handout for VMO, hip abductors, core, entire kinetic chain including proprioception exercises including cone touches, step downs, hip elevations and turn outs.  Could benefit from PT, regular exercise, upright biking, and a PFS knee brace to assist with tracking abnormalities.  

## 2015-04-04 ENCOUNTER — Ambulatory Visit (INDEPENDENT_AMBULATORY_CARE_PROVIDER_SITE_OTHER): Payer: 59 | Admitting: Internal Medicine

## 2015-04-04 ENCOUNTER — Encounter: Payer: Self-pay | Admitting: Internal Medicine

## 2015-04-04 DIAGNOSIS — E1065 Type 1 diabetes mellitus with hyperglycemia: Secondary | ICD-10-CM | POA: Diagnosis not present

## 2015-04-04 MED ORDER — GLUCOSE BLOOD VI STRP: ORAL_STRIP | Status: DC

## 2015-04-04 NOTE — Patient Instructions (Signed)
Please change the basal rates as follows: - Basal rates: 12 AM to 2 AM: 2.00 >> 2.10 2 AM to 5 AM: 2.10 >> 2.20 5 AM to 8 AM: 2.75 >> 2.85 8 AM to 12 PM: 2.30 >> 2.40 12 PM to 4 PM: 1.70 >> 1.80 4 PM to 6 PM: 1.80 >> 1.90 6 PM to 10:30 PM: 2.00 >> 2.10 10:30 PM: 2.05 >> 2.15 - bolus: - insulin to carb ratio:  12 AM to 11 AM: 2.0  11 AM to 5 PM: 2.2  5 PM-12 AM: 1.5  - ISF: 10 - target: 100-120 - IOB: 4h  Please return in 3 months.  Please have a TSH checked at your next lab draw.

## 2015-04-04 NOTE — Progress Notes (Signed)
Patient ID: Zachary Graham, male   DOB: February 13, 1961, 54 y.o.   MRN: [id number]  HPI: Zachary Graham is a 54 y.o.-year-old male, returning for f/u for DM1, dx 1993, insulin-dependent, uncontrolled, with complications (mild sensory peripheral neuropathy; hypoglycemia episodes). Last visit 3 mo ago.  He started on Aderrall last year. He started to lose weight and his sugars improved afterwards.  He is running more now. He developed runner's knee. He is also doing elliptical, bike. He is running 5 K's. Walk/run 25 mi a week.  He is very busy at work, and many times comes home at 1 AM. He does not have too much time to eat at work. He may sleep <4h a night.  Last hemoglobin A1c was: Lab Results  Component Value Date   HGBA1C 7.6 01/30/2015   HGBA1C 7.6* 11/14/2014   HGBA1C 8.2* 07/17/2014  04/05/2014: HbA1c 7.3%  Pt is on an insulin pump since 2006, currently on Paradigm 751.  He had an Enlite CGM (not using - feels readings were not accurate). He started on a Dexcom CGM since last visit. He loves it! Has Novolog in pump.  He tried Metformin in the past >> did not help  Pump settings: - Basal rates: 12 AM to 2 AM: 2.0 >> 1.95 2 AM to 5 AM: 2.4 >> 2.15 5 AM to 8 AM: 2.4 >> 2.20 8 AM to 12 PM: 3.10 >> 2.60 12 PM to 5 PM: 2.4 >> 1.80 5 PM-12 AM: 2.4 >> 2.10 - bolus: - insulin to carb ratio:  12 AM to 11 AM: 2.0  11 AM to 5 PM: 2.2 5 PM:  - ISF: 10 - target: 100-120 - IOB: 4h TDD basal: 52 % >> 39% TDD bolus: 48 % >> 61% TDD 128 +/- 17 units - Bolus wizard: On - Modified bolusing: no - Changes the pump site: every 2 days  Pt checks his sugars 2-3 a day- ave 188+/-96 >> 227 +/- 84 >> 154 +/- 73  >> 154 +/- 57 >> now 210 +/- 56:   Previously:   - am: 186-274 >> 58-200 >> 59x1, 103-287, 333 >> 79-183, 324 >> 97-334, 394 >> 59-272 >> 61-196 >> 196-303 - 8-9 am: 188-313 >> 83-168 >> 50x1, 107-270, 320 >> 145, 186-332 >>165, 367 >> 92-242 >> 101-282 >> 180-223 - noon: 210, 217 >>  95-263 (369x1) >> 63, 81, 200-238 >> 58-239 >> 125, 167 >> 81-133 >> 49x1, 105-219 >> 191-273 - before dinner:142-291 >> 70-195 >> 57, 129-197 >> 150-172 >> 102-265 >> 81-153, 269 >> 57-233 >> 141-247 - after dinner: 126-313 >> 140-301 >> 68-142, 344 >> 48X1, 90-204 >> 132-300 - bedtime (12 am): 147-288 >> 65-204 (316 x1) >> 108-295 >> 162-303 >> see above >> 62-181 >> 130-250 - 2 am: 147-308 >> 69-300 >> 158-370  >> 99, 190-247 >> 217, 242 >> 62-230 >> 162-211 >> 189-278 - 4 am: 144-360 >> 146-293 >> 149-235 >> 188-241 >> see above >> 256, 360 >> 79-234, 369 >> 147-233, 301 >> see bove  He has a history of severe hypoglycemia episodes in 2015. He never had DKA.  - no CKD, last BUN/creatinine:  Lab Results  Component Value Date   BUN 14 01/14/2015   CREATININE 0.82 01/14/2015  he is on Cozaar. - last set of lipids: Lab Results  Component Value Date   CHOL 127 11/14/2014   HDL 40.70 11/14/2014   LDLCALC 67 11/14/2014   LDLDIRECT 132.1 05/26/2012   TRIG  96.0 11/14/2014   CHOLHDL 3 11/14/2014  he is on Zocor. - last eye exam was in 08/2014 -  No DR. - + numbness and tingling in his feet.   He also has a history of GERD, hypertension, hyperlipidemia, OSA.   I reviewed pt's medications, allergies, PMH, social hx, family hx, and changes were documented in the history of present illness. Otherwise, unchanged from my initial visit note.  ROS: Constitutional: no weight loss, no fatigue, no subjective hyperthermia/hypothermia Eyes: no blurry vision, no xerophthalmia ENT: no sore throat, no nodules palpated in throat, no dysphagia/odynophagia, no hoarseness Cardiovascular: no CP/SOB/palpitations/no leg swelling Respiratory: no cough/SOB Gastrointestinal: no N/V/D/C Musculoskeletal: no muscle/joint aches Skin: no rashes Neurological: no tremors/numbness/tingling/dizziness  PE: Weight: 216 lbs  Wt Readings from Last 3 Encounters:  03/19/15 221 lb (100.245 kg)  01/30/15 216 lb 9.6  oz (98.249 kg)  01/14/15 215 lb (97.523 kg)   Constitutional: obese, in NAD Eyes: PERRLA, EOMI, no exophthalmos ENT: moist mucous membranes, no thyromegaly, no cervical lymphadenopathy Cardiovascular: RRR, No MRG Respiratory: CTA B Gastrointestinal: abdomen soft, NT, ND, BS+ Musculoskeletal: no deformities, strength intact in all 4 Skin: moist, warm, no rashes  ASSESSMENT: 1. DM2, insulin-dependent, uncontrolled, without complications - on pump - On CGM - Dexcom. He had an Enlite before, which found less accurate  - Barriers to good control:  Very busy schedule >> no time to check sugars and bolus during the day  Lack of sleep >> only sleeps 4h a night (!) and tries to catch up in the weekend  Reaches home late at night  - sometimes at 1 am >> eats most of the daily calories then  - sometimes forgets to bolus and boluses after he eats  PLAN:  1. Patient with long-standing type 1 diabetes, on an insulin pump. His weight  Fluctuates, but is essentially around the same as at last visit - He continues to have high sugars over night as he gets home late at night and he eats repeatedly after he gets home.  We discussed about using a square wave bolus after dinner if he plans to graze until late at night. - However, at  This visit, he has higher sugars during the day also, as he reduced his insulin during the day for fears of low-dose with exercise.  He started running more.  He stops the pump during exercise , however, he still wears the CGM and reattaching the pump if he sees that his sugars are getting high during exercise. - I advised him to do a temporary basal rate of 80% for 2 hours after exercise if he feels that his sugars are still dropping afterwards. He may need to decrease the percentage or increase the time of the temporary basal rate depending on his sugars - since  All his sugars are now higher than target, with an average of 210, I advised him to increase his pacer rates by  0.10 units her hour - I advised him to:  Patient Instructions  Please change the basal rates as follows: - Basal rates: 12 AM to 2 AM: 2.00 >> 2.10 2 AM to 5 AM: 2.10 >> 2.20 5 AM to 8 AM: 2.75 >> 2.85 8 AM to 12 PM: 2.30 >> 2.40 12 PM to 4 PM: 1.70 >> 1.80 4 PM to 6 PM: 1.80 >> 1.90 6 PM to 10:30 PM: 2.00 >> 2.10 10:30 PM: 2.05 >> 2.15 - bolus: - insulin to carb ratio:  12 AM to  11 AM: 2.0  11 AM to 5 PM: 2.2  5 PM-12 AM: 1.5  - ISF: 10 - target: 100-120 - IOB: 4h  Please return in 3 months.  Please have a TSH checked at your next lab draw.  - Needs another hemoglobin A1c at next visit,  We reviewed the one from last visit with PCP, which was 7.6%, stable - Return in about 3 months (around 07/05/2015).   - time spent with the patient: 40 min, of which >50% was spent in reviewing his pump download, discussing his hypo- and hyper-glycemic episodes, reviewing previous labs and pump settings and developing a plan to avoid hypo- an hyper-glycemia.

## 2015-04-24 ENCOUNTER — Encounter: Payer: Self-pay | Admitting: Family Medicine

## 2015-04-24 ENCOUNTER — Ambulatory Visit (INDEPENDENT_AMBULATORY_CARE_PROVIDER_SITE_OTHER): Payer: 59 | Admitting: Family Medicine

## 2015-04-24 VITALS — BP 118/74 | HR 70 | Ht 70.0 in | Wt 220.0 lb

## 2015-04-24 DIAGNOSIS — E669 Obesity, unspecified: Secondary | ICD-10-CM

## 2015-04-24 DIAGNOSIS — M222X2 Patellofemoral disorders, left knee: Secondary | ICD-10-CM | POA: Diagnosis not present

## 2015-04-24 DIAGNOSIS — M7632 Iliotibial band syndrome, left leg: Secondary | ICD-10-CM

## 2015-04-24 DIAGNOSIS — IMO0001 Reserved for inherently not codable concepts without codable children: Secondary | ICD-10-CM | POA: Insufficient documentation

## 2015-04-24 NOTE — Patient Instructions (Addendum)
Great to see you Ice still is good.  Continue the exercises  2 cups of water immediatly when you wake up 2 cups of water before each meal Breakfast within 30 minutes of waking up 4:1 ratio of carbs to protein within 45 minutes of exercise (whey protein isolate with almond milk will keep the sugar down) Goal of protein 150grams daily Eat something every 2 hours.  Alternate types of cardio through the week Stay active See me again in 4-6 weeks Happy holidays!

## 2015-04-24 NOTE — Progress Notes (Signed)
Pre visit review using our clinic review tool, if applicable. No additional management support is needed unless otherwise documented below in the visit note. 

## 2015-04-24 NOTE — Assessment & Plan Note (Signed)
Discussed multiple different diet changes.

## 2015-04-24 NOTE — Assessment & Plan Note (Signed)
Significantly.

## 2015-04-24 NOTE — Progress Notes (Signed)
[facility] [address] [place], [place] [address] Phone: [phone] Subjective:     CC: Knee pain follow up  [id number] [name] is a 54 y.o. male coming in with complaint of knee pain. Patient was found to have more of a distal iliotibial band syndrome as well as the patellofemoral syndrome. This is likely secondary to patient's hip dysplasia. Patient states overall he has made a proximally of 40-50% improvement. Was able to participate in different races. States that he felt fairly well afterwards. Patient states that he seems to be doing better working with his trainer doing the exercises on a greater regularity. Motivatecd to lose wight with goal of 175     Past Medical History  Diagnosis Date  . Diabetes mellitus   . GERD (gastroesophageal reflux disease)   . Hypertension   . Hyperlipidemia   . OSA (obstructive sleep apnea)    Past Surgical History  Procedure Laterality Date  . Tonsillectomy     Social History  Substance Use Topics  . Smoking status: Never Smoker   . Smokeless tobacco: Never Used  . Alcohol Use: No   Allergies  Allergen Reactions  . Zetia [Ezetimibe]     abd cramps  . Lisinopril   . Penicillins   . Shellfish Allergy    Family History  Problem Relation Age of Onset  . Colon cancer Maternal Grandfather   . Colon cancer Paternal Grandfather   . Alcohol abuse Neg Hx   . Asthma Neg Hx   . Diabetes Neg Hx   . Heart disease Father     Valve replacement; pacemaker  . Hyperlipidemia Neg Hx   . Hypertension Neg Hx   . Kidney disease Neg Hx   . Stroke Neg Hx      Past medical history, social, surgical and family history all reviewed in electronic medical record.   Review of Systems: No headache, visual changes, nausea, vomiting, diarrhea, constipation, dizziness, abdominal pain, skin rash, fevers, chills, night sweats, weight loss, swollen lymph nodes, body aches, joint swelling, muscle aches, chest pain,  shortness of breath, mood changes.   Objective Blood pressure 118/74, pulse 70, height 5\' 10"  (1.778 m), weight 220 lb (99.791 kg), SpO2 98 %.  General: No apparent distress alert and oriented x3 mood and affect normal, dressed appropriately.  HEENT: Pupils equal, extraocular movements intact  Respiratory: Patient's speak in full sentences and does not appear short of breath  Cardiovascular: No lower extremity edema, non tender, no erythema  Skin: Warm dry intact with no signs of infection or rash on extremities or on axial skeleton.  Abdomen: Soft nontender  Neuro: Cranial nerves II through XII are intact, neurovascularly intact in all extremities with 2+ DTRs and 2+ pulses.  Lymph: No lymphadenopathy of posterior or anterior cervical chain or axillae bilaterally.  Gait patient can externally rotate leg 180 almost. Does ambulate with external rotation of the legs MSK:  Non tender with full range of motion and good stability and symmetric strength and tone of shoulders, elbows, wrist, hip, and ankles bilaterally.  Knee: Left Normal to inspection with no erythema or effusion or obvious bony abnormalities. Mild lateral tilt of the patella Palpation normal with no warmth, joint line tenderness, patellar tenderness, or condyle tenderness. ROM full in flexion and extension and lower leg rotation. Ligaments with solid consistent endpoints including ACL, PCL, LCL, MCL. Negative Mcmurray's, Apley's, and Thessalonian tests. Less Pain with patellar compression from previous exam. Patellar  glide with minimal crepitus. Patellar and quadriceps tendons unremarkable. Hamstring and quadriceps strength is normal.  Contralateral knee has the same patellar tilt but nontender.     Impression and Recommendations:     This case required medical decision making of moderate complexity.

## 2015-04-24 NOTE — Assessment & Plan Note (Signed)
His made great strides. We discussed vastus medialis oblique and given an exercise prescription for more strengthening exercises. We discussed avoiding certain activity's. Patient would do more of an icing pedicle. Patient come back and see me again re-continues to improve. I do not feel that injections are necessary at this time.

## 2015-05-21 ENCOUNTER — Encounter (HOSPITAL_COMMUNITY): Payer: Self-pay | Admitting: Emergency Medicine

## 2015-05-21 ENCOUNTER — Emergency Department (HOSPITAL_COMMUNITY)
Admission: EM | Admit: 2015-05-21 | Discharge: 2015-05-21 | Disposition: A | Discharge status: Home / Self Care | Payer: 59 | Source: Home / Self Care | Attending: Family Medicine | Admitting: Family Medicine

## 2015-05-21 DIAGNOSIS — J189 Pneumonia, unspecified organism: Secondary | ICD-10-CM

## 2015-05-21 DIAGNOSIS — E1065 Type 1 diabetes mellitus with hyperglycemia: Secondary | ICD-10-CM

## 2015-05-21 MED ORDER — NAPROXEN 500 MG PO TABS: 500.0000 mg | ORAL_TABLET | Freq: Two times a day (BID) | ORAL | Status: DC

## 2015-05-21 MED ORDER — GUAIFENESIN ER 600 MG PO TB12: 600.0000 mg | ORAL_TABLET | Freq: Two times a day (BID) | ORAL | Status: DC

## 2015-05-21 MED ORDER — AZITHROMYCIN 250 MG PO TABS: 250.0000 mg | ORAL_TABLET | Freq: Every day | ORAL | Status: DC

## 2015-05-21 MED FILL — AZITHROMYCIN 250 MG TABLET: 250 | 1 days supply | Qty: 6 | Fill #0

## 2015-05-21 MED FILL — NAPROXEN 500 MG TABLET: 500 | 15 days supply | Qty: 30 | Fill #0

## 2015-05-21 NOTE — Discharge Instructions (Signed)
It was a pleasure to see you today.  I believe you have a "walking pneumonia" as the cause of your cough/illness.   I am prescribing the following:   1. AZITHROMYCIN 250mg  tablets, take 2 tablet by mouth today, then 1 tab daily for 4 more days. Antibiotic.   2. GUAIFENESIN 600mg  tablet, take 1 tablet by mouth twice daily, to thin mucus. Drink plenty of water.  3. NAPROXEN 500mg  tablets, take 1 tablet by mouth twice daily for headache/body aches.  Do not take with Excedrin or ibuprofen.   Follow up with your primary doctor or urgent care center if you experience fevers/chills, shortness of breath, or if not improving in the coming 48 hours.

## 2015-05-21 NOTE — ED Provider Notes (Signed)
CSN: [id number]     Arrival date & time 05/21/15  1432 History   First MD Initiated Contact with Patient 05/21/15 1535     Chief Complaint  Patient presents with  . URI   (Consider location/radiation/quality/duration/timing/severity/associated sxs/prior Treatment) Patient is a 55 y.o. male presenting with URI. The history is provided by the patient. No language interpreter was used.  URI Presenting symptoms: cough and fatigue   Presenting symptoms: no congestion, no ear pain and no fever   Associated symptoms: no wheezing   Patient presents with complaint of 1 week of cough/congestion/generalized body ache and headache.  Started after a run, when he experienced generalized muscle aches.  Within 2 days began to have cough that developed into paroxysms that wouldn't let up. Would have some retching with the cough. OTC cough syrups not helping. Develops headache associated with heavy coughing. Wife was sick for 1 week prior to his illness.   PMHX type 1 DM, has had relatively well-controlled home glucose readings despite decreased appetite.   ROS: No fevers, had temp 60F at home once.  Some generalized chills a few days ago. No shortness of breath, no nasal symptoms. Did receive flu shot this season.  No vomiting or diarrhea.   Past Medical History  Diagnosis Date  . Diabetes mellitus   . GERD (gastroesophageal reflux disease)   . Hypertension   . Hyperlipidemia   . OSA (obstructive sleep apnea)    Past Surgical History  Procedure Laterality Date  . Tonsillectomy     Family History  Problem Relation Age of Onset  . Colon cancer Maternal Grandfather   . Colon cancer Paternal Grandfather   . Alcohol abuse Neg Hx   . Asthma Neg Hx   . Diabetes Neg Hx   . Heart disease Father     Valve replacement; pacemaker  . Hyperlipidemia Neg Hx   . Hypertension Neg Hx   . Kidney disease Neg Hx   . Stroke Neg Hx    Social History  Substance Use Topics  . Smoking status: Never Smoker   .  Smokeless tobacco: Never Used  . Alcohol Use: No    Review of Systems  Constitutional: Positive for chills, appetite change and fatigue. Negative for fever.  HENT: Negative for congestion, ear pain and postnasal drip.   Respiratory: Positive for cough. Negative for shortness of breath and wheezing.   Cardiovascular: Negative for chest pain.  All other systems reviewed and are negative.   Allergies  Zetia; Lisinopril; Penicillins; and Shellfish allergy  Home Medications   Prior to Admission medications   Medication Sig Start Date End Date Taking? Authorizing Provider  amphetamine-dextroamphetamine (ADDERALL XR) 20 MG 24 hr capsule Take 1 capsule (20 mg total) by mouth daily. 03/19/15   [name], MD  azithromycin (ZITHROMAX) 250 MG tablet Take 1 tablet (250 mg total) by mouth daily. Take first 2 tablets together, then 1 every day until finished. 05/21/15   Willeen Niece, MD  Beclomethasone Dipropionate (QNASL) 80 MCG/ACT AERS Place 4 puffs into the nose daily. 11/14/14   [name], MD  glucagon (GLUCAGON EMERGENCY) 1 MG injection Inject 1 mg into the muscle once as needed. 02/05/13   Philemon Kingdom, MD  glucose blood (BAYER CONTOUR NEXT TEST) test strip Use 6x a day 04/04/15   Philemon Kingdom, MD  guaiFENesin (MUCINEX) 600 MG 12 hr tablet Take 1 tablet (600 mg total) by mouth 2 (two) times daily. 05/21/15   Willeen Niece,  MD  insulin aspart (NOVOLOG) 100 UNIT/ML injection For use in insulin pump, total 250 units per day 11/14/14   [name], MD  Insulin Infusion Pump Supplies (PARADIGM RESERVOIR 3ML) MISC 1 Device by Does not apply route daily.    Historical Provider, MD  INSULIN SYRINGE 1CC/29G 29G X 1/2" 1 ML MISC 1 Syringe by Does not apply route every 3 (three) days. 10/16/12   Renato Shin, MD  losartan (COZAAR) 50 MG tablet Take 1 tablet (50 mg total) by mouth daily. 11/14/14   [name], MD  naproxen (NAPROSYN) 500 MG tablet Take 1 tablet (500 mg total) by mouth 2 (two)  times daily with a meal. 05/21/15   Willeen Niece, MD  [facility] DELICA LANCETS 99991111 MISC Use to test blood sugar 8 times daily as instructed. 11/14/14   [name], MD  pantoprazole (PROTONIX) 40 MG tablet Take 1 tablet (40 mg total) by mouth daily. 11/14/14   [name], MD  simvastatin (ZOCOR) 40 MG tablet Take 1 tablet (40 mg total) by mouth daily. 11/14/14   [name], MD   Meds Ordered and Administered this Visit  Medications - No data to display  BP 138/74 mmHg  Pulse 74  Temp(Src) 98.8 F (37.1 C) (Oral)  Resp 20  SpO2 96% No data found.   Physical Exam  Constitutional:  Mildly ill appearing, no distress. No increased work of breathing, speaking fluidly in full sentences.   HENT:  Head: Normocephalic and atraumatic.  Right Ear: External ear normal.  Left Ear: External ear normal.  Nose: Nose normal.  Mouth/Throat: Oropharynx is clear and moist. No oropharyngeal exudate.  No frontal or maxillary sinus tenderness  Eyes: Conjunctivae and EOM are normal. Pupils are equal, round, and reactive to light.  Neck: Normal range of motion. Neck supple.  Cardiovascular: Normal rate, regular rhythm and normal heart sounds.  Exam reveals no gallop and no friction rub.   No murmur heard. Pulmonary/Chest: Effort normal and breath sounds normal. No respiratory distress. He has no wheezes. He has no rales. He exhibits no tenderness.  Lymphadenopathy:    He has no cervical adenopathy.  Skin: Skin is warm and dry.    ED Course  Procedures (including critical care time)  Labs Review Labs Reviewed - No data to display  Imaging Review No results found.   Visual Acuity Review  Right Eye Distance:   Left Eye Distance:   Bilateral Distance:    Right Eye Near:   Left Eye Near:    Bilateral Near:         MDM   1. Atypical pneumonia   2. Type 1 diabetes mellitus with hyperglycemia, with long-term current use of insulin ([place])    Patient with suspected atypical PNA, no  findings to suggest lobar PNA on exam.  His CBGs have been well controlled, patient with Type I DM.   Plan for treatment with macrolide abx, and mucolytic, NSAID. Reviewed prior labs/studies to confirm renal function prior to Rx.  Discussed plan and patient has no questions, voiced understanding.     Willeen Niece, MD 05/21/15 1600

## 2015-05-21 NOTE — ED Notes (Signed)
Patient reports onset 12/28 of symptoms.  Reports his wife was sick over the holidays.  Reports muscle aches, fatigue, cough, non-productive cough, coughing so much that he is retching, sore throat .

## 2015-05-22 ENCOUNTER — Encounter: Payer: Self-pay | Admitting: Internal Medicine

## 2015-05-23 ENCOUNTER — Other Ambulatory Visit: Payer: Self-pay | Admitting: *Deleted

## 2015-05-23 ENCOUNTER — Telehealth: Payer: Self-pay

## 2015-05-23 NOTE — Patient Outreach (Addendum)
Secure e-mail sent to [facility] e-mail  requesting that he schedule a follow up Link To Wellness appointment for sometime in January. Barrington Ellison RN,CCM,CDE [place] Management Coordinator Link To Wellness Office Phone [phone] Office Fax [phone]

## 2015-05-23 NOTE — Telephone Encounter (Signed)
Spoke with pt to check on fever. He stated fever spike to 102 yesterday and he down to 99 today. Advised him if he continued to feel bad or if temperature spiked back up to go back to urgent care or ED. Also advised pt to not let high grade fever being ignored over long course period over time. Pt complies.

## 2015-05-23 NOTE — Telephone Encounter (Signed)
This was handled. See phone note. adivsed to go to ED.

## 2015-05-26 ENCOUNTER — Other Ambulatory Visit: Payer: Self-pay | Admitting: Internal Medicine

## 2015-05-29 ENCOUNTER — Other Ambulatory Visit: Payer: Self-pay

## 2015-05-29 ENCOUNTER — Encounter: Payer: Self-pay | Admitting: Family Medicine

## 2015-05-29 ENCOUNTER — Ambulatory Visit (INDEPENDENT_AMBULATORY_CARE_PROVIDER_SITE_OTHER): Payer: 59 | Admitting: Family Medicine

## 2015-05-29 VITALS — BP 126/72 | HR 67 | Ht 70.0 in | Wt 219.0 lb

## 2015-05-29 DIAGNOSIS — M222X2 Patellofemoral disorders, left knee: Secondary | ICD-10-CM | POA: Diagnosis not present

## 2015-05-29 DIAGNOSIS — R5383 Other fatigue: Secondary | ICD-10-CM

## 2015-05-29 DIAGNOSIS — F900 Attention-deficit hyperactivity disorder, predominantly inattentive type: Secondary | ICD-10-CM

## 2015-05-29 DIAGNOSIS — E559 Vitamin D deficiency, unspecified: Secondary | ICD-10-CM

## 2015-05-29 DIAGNOSIS — E669 Obesity, unspecified: Secondary | ICD-10-CM

## 2015-05-29 MED ORDER — ALBUTEROL SULFATE HFA 108 (90 BASE) MCG/ACT IN AERS: 2.0000 | INHALATION_SPRAY | RESPIRATORY_TRACT | Status: DC | PRN

## 2015-05-29 MED ORDER — AMPHETAMINE-DEXTROAMPHET ER 20 MG PO CP24: 20.0000 mg | ORAL_CAPSULE | Freq: Every day | ORAL | Status: DC

## 2015-05-29 MED FILL — NovoLOG 100 UNIT/ML SOLN: 100 | 8 days supply | Qty: 20 | Fill #3

## 2015-05-29 MED FILL — VENTOLIN HFA 90 MCG INHALER: 108 (90 BAS | 16 days supply | Qty: 18 | Fill #0

## 2015-05-29 MED FILL — CONTOUR NEXT STRIPS: 50 days supply | Qty: 300 | Fill #0

## 2015-05-29 MED FILL — DEXTROAMP-AMPHET ER 20 MG C: 20 | 30 days supply | Qty: 30 | Fill #0

## 2015-05-29 NOTE — Progress Notes (Signed)
[name] [address] [place], [place] [address] Phone: [phone] Subjective:     CC: Knee pain follow up  [id number] Zachary Graham is a 55 y.o. male coming in with complaint of knee pain. Patient was found to have more of a distal iliotibial band syndrome as well as the patellofemoral syndrome. This is likely secondary to patient's hip dysplasia.  Patient was running and ended up to 8 miles 2-3 times a week. Was doing fairly well and then unfortunately did get pneumonia. Patient was in bed for approximately a week. Has started to be able to start doing regular daily activities again but has not been able to workout. Patient had difficulty even eating well he was sick. Patient states though that he did not have any significant weight loss even with him not eating for almost 3 days. Patient states that he continues to monitor his blood sugars and is doing relatively well. States though that he is feeling more fatigued than he has. Patient has had a family history of thyroid disease previously.     Past Medical History  Diagnosis Date  . Diabetes mellitus   . GERD (gastroesophageal reflux disease)   . Hypertension   . Hyperlipidemia   . OSA (obstructive sleep apnea)    Past Surgical History  Procedure Laterality Date  . Tonsillectomy     Social History  Substance Use Topics  . Smoking status: Never Smoker   . Smokeless tobacco: Never Used  . Alcohol Use: No   Allergies  Allergen Reactions  . Zetia [Ezetimibe]     abd cramps  . Lisinopril   . Penicillins   . Shellfish Allergy    Family History  Problem Relation Age of Onset  . Colon cancer Maternal Grandfather   . Colon cancer Paternal Grandfather   . Alcohol abuse Neg Hx   . Asthma Neg Hx   . Diabetes Neg Hx   . Heart disease Father     Valve replacement; pacemaker  . Hyperlipidemia Neg Hx   . Hypertension Neg Hx   . Kidney disease Neg Hx   . Stroke Neg Hx      Past medical  history, social, surgical and family history all reviewed in electronic medical record.   Review of Systems: No headache, visual changes, nausea, vomiting, diarrhea, constipation, dizziness, abdominal pain, skin rash, fevers, chills, night sweats, weight loss, swollen lymph nodes, body aches, joint swelling, muscle aches, chest pain, shortness of breath, mood changes.   Objective Blood pressure 126/72, pulse 67, height 5\' 10"  (1.778 m), weight 219 lb (99.338 kg), SpO2 94 %.  General: No apparent distress alert and oriented x3 mood and affect normal, dressed appropriately.  HEENT: Pupils equal, extraocular movements intact  Respiratory: Patient's speak in full sentences and does not appear short of breath mild x-ray wheezes still noted Cardiovascular: No lower extremity edema, non tender, no erythema  Skin: Warm dry intact with no signs of infection or rash on extremities or on axial skeleton.  Abdomen: Soft nontender  Neuro: Cranial nerves II through XII are intact, neurovascularly intact in all extremities with 2+ DTRs and 2+ pulses.  Lymph: No lymphadenopathy of posterior or anterior cervical chain or axillae bilaterally.  Gait patient can externally rotate leg 180 almost. Does ambulate with external rotation of the legs MSK:  Non tender with full range of motion and good stability and symmetric strength and tone of shoulders, elbows, wrist, hip, and ankles bilaterally.  Knee: Left Normal to inspection with no erythema or effusion or obvious bony abnormalities. Mild lateral tilt of the patella Palpation normal with no warmth, joint line tenderness, patellar tenderness, or condyle tenderness. ROM full in flexion and extension and lower leg rotation. Ligaments with solid consistent endpoints including ACL, PCL, LCL, MCL. Negative Mcmurray's, Apley's, and Thessalonian tests. Less Pain with patellar compression from previous exam. Patellar glide with minimal crepitus. Patellar and quadriceps  tendons unremarkable. Hamstring and quadriceps strength is normal.  Contralateral knee has the same patellar tilt but nontender.     Impression and Recommendations:     This case required medical decision making of moderate complexity.

## 2015-05-29 NOTE — Assessment & Plan Note (Signed)
Patient was doing very well. Patient given an exercise prescription to increase slowly over the course of next several weeks. We discussed the icing regimen as well as continuing the over-the-counter natural supplements. Patient will follow-up again in 4 weeks for further evaluation.

## 2015-05-29 NOTE — Assessment & Plan Note (Signed)
Will recheck secondary to any type of possibility of the decreasing vitamin D recently into his respiratory illness or potentially secondary to him to consider long to improve. See if any other supplementation as necessary.

## 2015-05-29 NOTE — Patient Instructions (Addendum)
Good to see you Get back on the horse if you can but start 2-3 miles at a time and only 3 times a week for 2 weeks Consider DHEA at 50mg  daily for 4 weeks but consider waiting until labs  Albuterol inhaler 30 minutes before working out for a week or 2.,  Once back at it see me again in 4 weeks.

## 2015-05-29 NOTE — Assessment & Plan Note (Signed)
Patient continues to have difficulty with his weight loss. Patient has a family history of thyroid disease that could be contribute in. We discussed diabetes also being a concern. Patient is on insulin which makes it very difficult. We discussed getting labs to make sure nothing else is possibly contribute in. Patient will have these done when he feels better. Patient will come back and see me again in 4 weeks for further evaluation. Patient is motivated to lose weight.

## 2015-05-29 NOTE — Progress Notes (Signed)
Pre visit review using our clinic review tool, if applicable. No additional management support is needed unless otherwise documented below in the visit note. 

## 2015-05-30 ENCOUNTER — Other Ambulatory Visit: Payer: Self-pay | Admitting: *Deleted

## 2015-05-30 ENCOUNTER — Encounter: Payer: Self-pay | Admitting: *Deleted

## 2015-05-30 VITALS — BP 104/68 | Ht 70.0 in | Wt 219.4 lb

## 2015-05-30 DIAGNOSIS — E109 Type 1 diabetes mellitus without complications: Secondary | ICD-10-CM

## 2015-05-30 LAB — POCT CBG (FASTING - GLUCOSE)-MANUAL ENTRY: Glucose Fasting, POC: 176 mg/dL — AB (ref 70–99)

## 2015-05-30 LAB — POCT GLYCOSYLATED HEMOGLOBIN (HGB A1C): HEMOGLOBIN A1C: 8.1

## 2015-05-30 NOTE — Patient Outreach (Signed)
[facility]) Care Management   05/30/2015  ZABDI KASAL 06-Jun-1960 [id number]  Zachary Graham is an 55 y.o. male who presents to the [place] [facility] office for routine Link To Wellness follow up for self management assistance with  IDDM, HTN and hyperlipidemia.  Subjective:  Zachary Graham says he was diagnosed with pneumonia on 05/21/15 and is still recovering. He says he has a cough and some congestion but he says his symptoms are improving and that he no longer has a fever. He is requesting that his POC Hgb A1C be checked as he was having difficulty with his insulin pump and had several episodes of hyperglycemia >300 until the pump was replaced last week. He says he has not be able to exercise since he developed pneumonia. He says he has eliminated all diet drinks, fried foods, and most bread from his diet. He is drinking only water or carbonated non sweet flavored water He says he is seeing a sports medicine MD for treatment of a right knee injury. He was told by the MD that he could resume light exercise in a week. He reports he will see Dr. Cruzita Lederer on 07/03/15.  Objective:   Review of Systems  Constitutional: Negative.   Respiratory: Positive for cough.     Physical Exam  Constitutional: He is oriented to person, place, and time. He appears well-developed and well-nourished.  Respiratory: Effort normal.  Neurological: He is alert and oriented to person, place, and time.  Skin: Skin is warm and dry.  Psychiatric: He has a normal mood and affect. His behavior is normal. Judgment and thought content normal.    Current Medications:   Current Outpatient Prescriptions  Medication Sig Dispense Refill  . amphetamine-dextroamphetamine (ADDERALL XR) 20 MG 24 hr capsule Take 1 capsule (20 mg total) by mouth daily. 30 capsule 0  . glucose blood (BAYER CONTOUR NEXT TEST) test strip Use 6x a day 300 each 11  . guaiFENesin (MUCINEX) 600 MG 12 hr  tablet Take 1 tablet (600 mg total) by mouth 2 (two) times daily. 20 tablet 0  . insulin aspart (NOVOLOG) 100 UNIT/ML injection For use in insulin pump, total 250 units per day 30 vial 11  . Insulin Infusion Pump Supplies (PARADIGM RESERVOIR 3ML) MISC 1 Device by Does not apply route daily.    . INSULIN SYRINGE 1CC/29G 29G X 1/2" 1 ML MISC 1 Syringe by Does not apply route every 3 (three) days. 10 each 3  . losartan (COZAAR) 50 MG tablet Take 1 tablet (50 mg total) by mouth daily. 90 tablet 3  . Melatonin 5 MG CHEW Chew 2 tablets by mouth at bedtime.    . Multiple Vitamins-Minerals (MULTIVITAMIN GUMMIES ADULT) CHEW Chew 1 Dose by mouth daily.    . naproxen (NAPROSYN) 500 MG tablet Take 1 tablet (500 mg total) by mouth 2 (two) times daily with a meal. 30 tablet 0  . ONETOUCH DELICA LANCETS 99991111 MISC Use to test blood sugar 8 times daily as instructed. 300 each 11  . pantoprazole (PROTONIX) 40 MG tablet Take 1 tablet (40 mg total) by mouth daily. 90 tablet 2  . simvastatin (ZOCOR) 40 MG tablet Take 1 tablet (40 mg total) by mouth daily. 90 tablet 3  . albuterol (PROVENTIL HFA;VENTOLIN HFA) 108 (90 Base) MCG/ACT inhaler Inhale 2 puffs into the lungs every 4 (four) hours as needed for wheezing or shortness of breath. (Patient not taking: Reported on 05/30/2015) 1 each 6  .  Beclomethasone Dipropionate (QNASL) 80 MCG/ACT AERS Place 4 puffs into the nose daily. (Patient not taking: Reported on 05/30/2015) 8.7 g 11  . glucagon (GLUCAGON EMERGENCY) 1 MG injection Inject 1 mg into the muscle once as needed. (Patient not taking: Reported on 05/30/2015) 2 each prn   No current facility-administered medications for this visit.    Functional Status:   In your present state of health, do you have any difficulty performing the following activities: 01/30/2015  Hearing? N  Vision? N  Difficulty concentrating or making decisions? N  Walking or climbing stairs? N  Dressing or bathing? N  Doing errands, shopping? N     Fall/Depression Screening:    PHQ 2/9 Scores 01/30/2015 01/16/2015 12/20/2014 06/26/2014 07/25/2013 06/26/2012  PHQ - 2 Score 0 0 0 0 0 0    Assessment:   [employer] employee and Link To Wellness member with IDDM, hyperlipidemia and HTN, meeting treatment targets for HTN and hyperlipidemia but with elevated POC Hgb A1C of 8.1% most likely due to illness and pump malfunction.  Plan:  [facility] CM Care Plan Problem One        Most Recent Value   Care Plan Problem One  Patient with longstanding IDDM being managed as Type I with current POC Hgb A1C= 8.1% on 05/30/15, also with HTN and hyperlipidemia and meeting treatment targets as evidenced by BP readings consistently <140/<90 and normal lipid profile on 11/14/14   Role Documenting the Problem One  Care Management Basile for Problem One  Active   [facility] Long Term Goal (31-90 days)  Improved Control of IDDM as evidenced by improved Hgb A1C <8.0%) at the next assessment without increased episodes of hypoglycemia ( no more than 4 per week) and no severe hypoglycemia and ongoing good control of HTN and hyperlipidemia as evidenced by normal BP readings and normal lipid profile at each assessment     [facility] Long Term Goal Start Date  05/30/15   Interventions for Problem One Long Term Goal  assessed blood sugar readings, meal plan and exercise regimen, discussed reasons for increased blood sugars and Hgb A1C ( illness, not able to exercise, pump malfunction), discussed sick day rules, discussed strategies to improve blood sugar control, provided Elta [place] with written information on the Medtronic Minimed 630G system, will arrange for Link To Wellness follow up after he sees Dr. Cruzita Lederer on  07/03/15      RNCM to fax today's office visit note to Dr. Ronnald Ramp and Dr. Cruzita Lederer. RNCM will meet quarterly and as needed with patient per Link To Wellness program guidelines to assist with IDDM, HTN and hyperlipidemia self-management and assess patient's progress toward  mutually set goals. Barrington Ellison RN,CCM,CDE [place] Management Coordinator Link To Wellness Office Phone [phone] Office Fax [phone]

## 2015-06-03 MED FILL — NovoLOG 100 UNIT/ML SOLN: 100 | 8 days supply | Qty: 20 | Fill #4

## 2015-06-06 ENCOUNTER — Other Ambulatory Visit (INDEPENDENT_AMBULATORY_CARE_PROVIDER_SITE_OTHER): Payer: 59

## 2015-06-06 DIAGNOSIS — R5383 Other fatigue: Secondary | ICD-10-CM | POA: Diagnosis not present

## 2015-06-06 LAB — T4, FREE: FREE T4: 1.13 ng/dL (ref 0.60–1.60)

## 2015-06-06 LAB — TSH: TSH: 2.03 u[IU]/mL (ref 0.35–4.50)

## 2015-06-06 LAB — VITAMIN D 25 HYDROXY (VIT D DEFICIENCY, FRACTURES): VITD: 41.95 ng/mL (ref 30.00–100.00)

## 2015-06-06 LAB — TESTOSTERONE: Testosterone: 280.27 ng/dL — ABNORMAL LOW (ref 300.00–890.00)

## 2015-06-19 ENCOUNTER — Ambulatory Visit: Payer: Self-pay | Admitting: Internal Medicine

## 2015-06-19 ENCOUNTER — Encounter: Payer: Self-pay | Admitting: Podiatry

## 2015-06-19 ENCOUNTER — Ambulatory Visit (INDEPENDENT_AMBULATORY_CARE_PROVIDER_SITE_OTHER): Payer: 59 | Admitting: Podiatry

## 2015-06-19 DIAGNOSIS — M216X9 Other acquired deformities of unspecified foot: Secondary | ICD-10-CM | POA: Diagnosis not present

## 2015-06-19 DIAGNOSIS — M21969 Unspecified acquired deformity of unspecified lower leg: Secondary | ICD-10-CM | POA: Diagnosis not present

## 2015-06-19 DIAGNOSIS — M2040 Other hammer toe(s) (acquired), unspecified foot: Secondary | ICD-10-CM

## 2015-06-19 DIAGNOSIS — M216X2 Other acquired deformities of left foot: Secondary | ICD-10-CM

## 2015-06-19 DIAGNOSIS — M216X1 Other acquired deformities of right foot: Secondary | ICD-10-CM

## 2015-06-19 NOTE — Progress Notes (Signed)
Subjective: 55 year old male presents requesting a new pair orthotics. The old pair is helping and need a new pair. He runs. He has noted recently the 1st and 2nd toes are overlapping in left foot. His mother had the same problem.   Objective: Neurovascular status are within normal. No edema or erythema noted. Dermatologic: No abnormal skin lesions noted. Orthopedic: Tight Achilles tendon on both feet. Adducted forefoot, weak and elevated first ray bilateral, medially deviating 2nd digit L>R, mild hallux abductus left foot.  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 3. Hypermobile first ray bilateral. 4. Deviating 2nd digit left with intrinsic muscle imbalance.  Plan: Reviewed clinical findings and available treatment options. Reviewed stretch exercise. Will order new pair orthotics same as the old pair that is in the file.

## 2015-06-19 NOTE — Patient Instructions (Signed)
Seen for orthotic prep. Noted of weakened first ray and drifting 2nd toe.  Toe spacer dispensed. Will order new pair according to the old order.

## 2015-07-01 MED FILL — DEXTROAMP-AMPHET ER 20 MG C: 20 | 30 days supply | Qty: 30 | Fill #0

## 2015-07-03 ENCOUNTER — Encounter: Payer: Self-pay | Admitting: Internal Medicine

## 2015-07-03 ENCOUNTER — Ambulatory Visit (INDEPENDENT_AMBULATORY_CARE_PROVIDER_SITE_OTHER): Payer: 59 | Admitting: Internal Medicine

## 2015-07-03 VITALS — BP 118/68 | HR 71 | Temp 97.9°F | Resp 12 | Wt 221.0 lb

## 2015-07-03 DIAGNOSIS — E1065 Type 1 diabetes mellitus with hyperglycemia: Secondary | ICD-10-CM

## 2015-07-03 MED ORDER — NAPROXEN 500 MG PO TABS: 500.0000 mg | ORAL_TABLET | Freq: Two times a day (BID) | ORAL | Status: DC

## 2015-07-03 MED FILL — NAPROXEN 500 MG TABLET: 500 | 15 days supply | Qty: 30 | Fill #0

## 2015-07-03 NOTE — Patient Instructions (Signed)
Please change the pump settings: - Basal rates: 12 AM to 2 AM: 2.10 2 AM to 5 AM: 2.20 5 AM to 8 AM: 2.85 8 AM to 12 PM: 2.40 12 PM to 4 PM: 1.80 4 PM to 6 PM: 1.95 6 PM to 10:30 PM: 2.10 10:30 PM: 2.15 - bolus: - insulin to carb ratio:  12 AM to 11 AM: 2.0 >> 1.7 11 AM to 5 PM: 2.2 5 PM: 1.5 (if you plan to exercise after dinner: 1.8) - ISF: 10 - target: 100-120 - IOB: 4h  Please come back for a follow-up appointment in 3 months

## 2015-07-03 NOTE — Progress Notes (Signed)
Patient ID: Zachary Graham, male   DOB: 27-May-1960, 55 y.o.   MRN: [id number]  HPI: Zachary Graham is a 55 y.o.-year-old male, returning for f/u for DM1, dx 1993, insulin-dependent, uncontrolled, with complications (mild sensory peripheral neuropathy; hypoglycemia episodes). Last visit 3 mo ago.  He had PNA in 04/2015. URI sxs for the last 2 months.   He is still very busy at work, and many times comes home at 11 PM. He does not have too much time to eat at work. He may sleep <4h a night. He is taking Adderall >> lost 60 lbs since he started. He also started to exercise: gym, running (less since PNA).  Last hemoglobin A1c was: Lab Results  Component Value Date   HGBA1C 8.1 05/30/2015   HGBA1C 7.6 01/30/2015   HGBA1C 7.6* 11/14/2014  04/05/2014: HbA1c 7.3%  Pt is on an insulin pump since 2006, currently on Paradigm 751.  His pump malfunctioned >> replaced 05/2015 >> now better. He believes his last HbA1c was high 2/2 this (pump kept stopping). He had an Enlite CGM (not using - feels readings were not accurate). Also on a Dexcom CGM. Has Novolog in pump.  He tried Metformin in the past >> did not help  Pump settings: - Basal rates: 12 AM to 2 AM: 2.10 2 AM to 5 AM: 2.20 5 AM to 8 AM: 2.85 8 AM to 12 PM: 2.40 12 PM to 4 PM: 1.80 4 PM to 6 PM: 1.95 6 PM to 10:30 PM: 2.10 10:30 PM: 2.15 - bolus: - insulin to carb ratio:  12 AM to 11 AM: 2.0  11 AM to 5 PM: 2.2 5 PM: 1.5 - ISF: 10 - target: 100-120 - IOB: 4h TDD basal: 53 units >> 38% TDD bolus: 85.7 units >> 62% TDD 138 +/- 22 units  - Bolus wizard: On - Modified bolusing: no - Changes the pump site: every 2 days  Pt checks his sugars 3.5 a day- ave 188+/-96 >> 227 +/- 84 >> 154 +/- 73  >> 154 +/- 57 >> 210 +/- 56 >> now 167 +/- 82   Previously:   - am: 59x1, 103-287, 333 >> 79-183, 324 >> 97-334, 394 >> 59-272 >> 61-196 >> 196-303 >> 95-272, >400 - 8-9 am: 83-168 >> 50x1, 107-270, 320 >> 145, 186-332 >>165, 367 >> 92-242  >> 101-282 >> 180-223 >> 98-153 - noon: 95-263 (369x1) >> 63, 81, 200-238 >> 58-239 >> 125, 167 >> 81-133 >> 49x1, 105-219 >> 191-273 >> 110-161 - before dinner:142-291 >> 70-195 >> 57, 129-197 >> 150-172 >> 102-265 >> 81-153, 269 >> 57-233 >> 141-247 >> 74-226 - after dinner: 126-313 >> 140-301 >> 68-142, 344 >> 48X1, 90-204 >> 132-300 >> 70 (rarely)-337 - bedtime (12 am): 147-288 >> 65-204 (316 x1) >> 108-295 >> 162-303 >> see above >> 62-181 >> 130-250 >> see above - 2 am: 147-308 >> 69-300 >> 158-370  >> 99, 190-247 >> 217, 242 >> 62-230 >> 162-211 >> 189-278 >> 128-197, 337 - 4 am: 144-360 >> 146-293 >> 149-235 >> 188-241 >> see above >> 256, 360 >> 79-234, 369 >> 147-233, 301 >> see above  He has a history of severe hypoglycemia episodes in 2015. He never had DKA.  - no CKD, last BUN/creatinine:  Lab Results  Component Value Date   BUN 14 01/14/2015   CREATININE 0.82 01/14/2015  he is on Cozaar. - last set of lipids: Lab Results  Component Value Date   CHOL  127 11/14/2014   HDL 40.70 11/14/2014   LDLCALC 67 11/14/2014   LDLDIRECT 132.1 05/26/2012   TRIG 96.0 11/14/2014   CHOLHDL 3 11/14/2014  he is on Zocor. - last eye exam was in 08/2014 -  No DR. - + numbness and tingling in his feet.   He also has a history of GERD, hypertension, hyperlipidemia, OSA.   I reviewed pt's medications, allergies, PMH, social hx, family hx, and changes were documented in the history of present illness. Otherwise, unchanged from my initial visit note.  ROS: Constitutional: no weight loss, no fatigue, no subjective hyperthermia/hypothermia Eyes: no blurry vision, no xerophthalmia ENT: no sore throat, no nodules palpated in throat, no dysphagia/odynophagia, no hoarseness Cardiovascular: no CP/SOB/palpitations/no leg swelling Respiratory: + cough/+ SOB Gastrointestinal: no N/V/D/C Musculoskeletal: no muscle/joint aches Skin: no rashes Neurological: no  tremors/numbness/tingling/dizziness  PE: BP 118/68 mmHg  Pulse 71  Temp(Src) 97.9 F (36.6 C) (Oral)  Resp 12  Wt 221 lb (100.245 kg)  SpO2 96% Body mass index is 31.71 kg/(m^2).  Wt Readings from Last 3 Encounters:  07/03/15 221 lb (100.245 kg)  05/30/15 219 lb 6.4 oz (99.519 kg)  05/29/15 219 lb (99.338 kg)   Constitutional: obese, in NAD Eyes: PERRLA, EOMI, no exophthalmos ENT: moist mucous membranes, no thyromegaly, no cervical lymphadenopathy Cardiovascular: RRR, No MRG Respiratory: CTA B Gastrointestinal: abdomen soft, NT, ND, BS+ Musculoskeletal: no deformities, strength intact in all 4 Skin: moist, warm, no rashes  ASSESSMENT: 1. DM2, insulin-dependent, uncontrolled, without complications - on pump - On CGM - Dexcom. He had an Enlite before, which found less accurate  - Barriers to good control:  Very busy schedule >> no time to check sugars and bolus during the day  Lack of sleep >> only sleeps 4h a night (!) and tries to catch up in the weekend  Reaches home late at night  - sometimes at 1 am >> eats most of the daily calories then  - sometimes forgets to bolus and boluses after he eats  PLAN:  1. Patient with long-standing type 1 diabetes, on an insulin pump. His weight  Fluctuates, but is essentially around the same as at last visit - He continues to have high sugars over night as he gets home late at night and he eats repeatedly after he gets home (he gets more than 60% of his  dailycalories at night).  We will decrease his insulin to carb ratio for dinner and nighttime. -  At last visit, we increased his basal rates , but  I discussed with him that he also needs to decrease his basal rate temporarily to 70-80% after exercise. He may also need to increase his insulin to carb ratio before dinner he plans to exercise afterwards. He stops the pump during exercise , however, he still wears the CGM and reattaching the pump if he sees that his sugars are getting high  during exercise. - I advised him to:  Patient Instructions  Please change the pump settings: - Basal rates: 12 AM to 2 AM: 2.10 2 AM to 5 AM: 2.20 5 AM to 8 AM: 2.85 8 AM to 12 PM: 2.40 12 PM to 4 PM: 1.80 4 PM to 6 PM: 1.95 6 PM to 10:30 PM: 2.10 10:30 PM: 2.15 - bolus: - insulin to carb ratio:  12 AM to 11 AM: 2.0 >> 1.7 11 AM to 5 PM: 2.2 5 PM: 1.5 (if you plan to exercise after dinner: 1.8) - ISF: 10 -  target: 100-120 - IOB: 4h  Please come back for a follow-up appointment in 3 months  -  We reviewed together his previous hemoglobin A1c, which was probably high due to his illness and malfunction of his pump. Now, on the new pump, sugars are definitely better.  Will recheck his hemoglobin A1c at l next visit - Return in about 3 months (around 09/30/2015).   - time spent with the patient: 40 min, of which >50% was spent in reviewing his pump download, discussing his hypo- and hyper-glycemic episodes, reviewing previous labs and pump settings and developing a plan to avoid hypo- an hyper-glycemia.

## 2015-07-07 MED FILL — NovoLOG 100 UNIT/ML SOLN: 100 | 8 days supply | Qty: 20 | Fill #5

## 2015-07-28 MED FILL — PANTOPRAZOLE SOD DR 40 MG T: 40 | 90 days supply | Qty: 90 | Fill #2

## 2015-07-28 MED FILL — NovoLOG 100 UNIT/ML SOLN: 100 | 8 days supply | Qty: 20 | Fill #6

## 2015-07-28 MED FILL — CONTOUR NEXT STRIPS: 50 days supply | Qty: 300 | Fill #1

## 2015-07-29 MED FILL — DEXTROAMP-AMPHET ER 20 MG C: 20 | 30 days supply | Qty: 30 | Fill #0

## 2015-07-31 DIAGNOSIS — E109 Type 1 diabetes mellitus without complications: Secondary | ICD-10-CM | POA: Diagnosis not present

## 2015-08-27 MED FILL — NovoLOG 100 UNIT/ML SOLN: 100 | 4 days supply | Qty: 10 | Fill #7

## 2015-09-04 DIAGNOSIS — H5213 Myopia, bilateral: Secondary | ICD-10-CM | POA: Diagnosis not present

## 2015-09-04 LAB — HM DIABETES EYE EXAM

## 2015-09-11 DIAGNOSIS — Z86010 Personal history of colonic polyps: Secondary | ICD-10-CM | POA: Diagnosis not present

## 2015-09-11 DIAGNOSIS — K922 Gastrointestinal hemorrhage, unspecified: Secondary | ICD-10-CM | POA: Diagnosis not present

## 2015-09-11 DIAGNOSIS — Z800 Family history of malignant neoplasm of digestive organs: Secondary | ICD-10-CM | POA: Diagnosis not present

## 2015-09-11 MED FILL — CONTOUR NEXT STRIPS: 50 days supply | Qty: 300 | Fill #2

## 2015-09-30 ENCOUNTER — Ambulatory Visit: Payer: 59 | Admitting: Internal Medicine

## 2015-10-05 ENCOUNTER — Encounter: Payer: Self-pay | Admitting: Internal Medicine

## 2015-10-06 ENCOUNTER — Telehealth: Payer: Self-pay | Admitting: Internal Medicine

## 2015-10-06 ENCOUNTER — Ambulatory Visit (INDEPENDENT_AMBULATORY_CARE_PROVIDER_SITE_OTHER): Payer: 59 | Admitting: Family Medicine

## 2015-10-06 ENCOUNTER — Encounter: Payer: Self-pay | Admitting: Family Medicine

## 2015-10-06 ENCOUNTER — Other Ambulatory Visit (INDEPENDENT_AMBULATORY_CARE_PROVIDER_SITE_OTHER): Payer: 59

## 2015-10-06 VITALS — BP 132/86 | HR 76 | Ht 70.0 in | Wt 239.0 lb

## 2015-10-06 DIAGNOSIS — R635 Abnormal weight gain: Secondary | ICD-10-CM

## 2015-10-06 DIAGNOSIS — M76891 Other specified enthesopathies of right lower limb, excluding foot: Secondary | ICD-10-CM | POA: Insufficient documentation

## 2015-10-06 DIAGNOSIS — E559 Vitamin D deficiency, unspecified: Secondary | ICD-10-CM

## 2015-10-06 DIAGNOSIS — M65851 Other synovitis and tenosynovitis, right thigh: Secondary | ICD-10-CM | POA: Diagnosis not present

## 2015-10-06 DIAGNOSIS — E669 Obesity, unspecified: Secondary | ICD-10-CM

## 2015-10-06 DIAGNOSIS — E259 Adrenogenital disorder, unspecified: Secondary | ICD-10-CM

## 2015-10-06 DIAGNOSIS — E250 Congenital adrenogenital disorders associated with enzyme deficiency: Secondary | ICD-10-CM

## 2015-10-06 DIAGNOSIS — F900 Attention-deficit hyperactivity disorder, predominantly inattentive type: Secondary | ICD-10-CM

## 2015-10-06 LAB — THYROID PANEL WITH TSH
Free Thyroxine Index: 2 (ref 1.4–3.8)
T3 UPTAKE: 30 % (ref 22–35)
T4 TOTAL: 6.8 ug/dL (ref 4.5–12.0)
TSH: 3.16 m[IU]/L (ref 0.40–4.50)

## 2015-10-06 LAB — SEDIMENTATION RATE: Sed Rate: 12 mm/hr (ref 0–20)

## 2015-10-06 NOTE — Assessment & Plan Note (Signed)
Encourage continued supplementation.

## 2015-10-06 NOTE — Patient Instructions (Signed)
Good to see you  Lets recheck the labs and see if anything changed recently.  New exercises for your hip Try elliptical or biking a little bit more If the pain gets worse it could be your appendix, so monitor it closely.  DHEA 50mg  daily for next 4 weeks.  Green tea would be better to drink.  Eat within 30 minutes Do not take your fiber at same time as vitamins Bromelain 2500 GCU with each meal may help with absorption  CLA 1 gram daily 2 times a day  See me again when you get back an d congrats!!!

## 2015-10-06 NOTE — Progress Notes (Signed)
[name] [place], [place] [address] Phone: [phone] Subjective:     CC: Right abdominal/hip pain as well as weight gain  [id number] Zachary Graham is a 55 y.o. male coming in with complaint of  Right hip pain. Patient was walking on a regular distance in trying to do a regular routine to help him lose weight. Unfortunate started having more pain. Only hurts him with activity. Seems to be in his right lower quadrant. No association with abdominal discomfort, eating, bowel or bladder incontinence. Patient states though that when he has been resting now at this time not making as much improvement either. Rates the severity of pain a 6 out of 10. Patient is concerned because he needs to continue to walk to be able to lose weight. Since he has not been walking on a regular basis and attempting to try to lift weights he has actually gained 20 pounds. Patient continues to watch his diet. States that he is notices energy level going lower. We have checked left previously that were fairly unremarkable except for some low testosterone. Patient did not want to start any type of supplementation. Patient states that the pain is not affecting daily activities yet but he does feel that it is getting worse and he is noticing it more throughout the day. States that sometimes when he rolls over the pain can be severe at night as well.    Past Medical History  Diagnosis Date  . Diabetes mellitus   . GERD (gastroesophageal reflux disease)   . Hypertension   . Hyperlipidemia   . OSA (obstructive sleep apnea)    Past Surgical History  Procedure Laterality Date  . Tonsillectomy     Social History   Social History  . Marital Status: Married    Spouse Name: N/A  . Number of Children: 3  . Years of Education: N/A   Occupational History  . RADIATION SAFETY OFFICER [employer]   Social History Main Topics  . Smoking status: Never Smoker   . Smokeless  tobacco: Never Used  . Alcohol Use: No  . Drug Use: No  . Sexual Activity:    Partners: Female   Other Topics Concern  . None   Social History Narrative   Regular exercise: runs 3 miles 3x week, takes one day off per week to rest   Caffeine use: stopped all diet soda on 04/23/15 per advice from Sports MD   Allergies  Allergen Reactions  . Zetia [Ezetimibe]     abd cramps  . Lisinopril   . Penicillins   . Shellfish Allergy    Family History  Problem Relation Age of Onset  . Colon cancer Maternal Grandfather   . Colon cancer Paternal Grandfather   . Alcohol abuse Neg Hx   . Asthma Neg Hx   . Diabetes Neg Hx   . Heart disease Father     Valve replacement; pacemaker  . Hyperlipidemia Neg Hx   . Hypertension Neg Hx   . Kidney disease Neg Hx   . Stroke Neg Hx     Past medical history, social, surgical and family history all reviewed in electronic medical record.  No pertanent information unless stated regarding to the chief complaint.   Review of Systems: No headache, visual changes, nausea, vomiting, diarrhea, constipation, dizziness, abdominal pain, skin rash, fevers, chills, night sweats, weight loss, swollen lymph nodes, body aches, joint swelling, muscle aches, chest pain, shortness of breath, mood  changes.   Objective Blood pressure 132/86, pulse 76, height 5\' 10"  (1.778 m), weight 239 lb (108.41 kg), SpO2 97 %.  General: No apparent distress alert and oriented x3 mood and affect normal, dressed appropriately.  HEENT: Pupils equal, extraocular movements intact  Respiratory: Patient's speak in full sentences and does not appear short of breath  Cardiovascular: No lower extremity edema, non tender, no erythema  Skin: Warm dry intact with no signs of infection or rash on extremities or on axial skeleton.  Abdomen: Tender to palpation in the right lower quadrant. Minimal involuntary guarding noted. No rebound tenderness. No pain to percussion. Neuro: Cranial nerves II  through XII are intact, neurovascularly intact in all extremities with 2+ DTRs and 2+ pulses.  Lymph: No lymphadenopathy of posterior or anterior cervical chain or axillae bilaterally.  Gait very mild antalgic gait.  MSK:  Non tender with full range of motion and good stability and symmetric strength and tone of shoulders, elbows, wrist, hip, knee and ankles bilaterally.  Back exam shows the patient does have tightness of the hip flexor. Negative straight leg test but tight hamstrings bilaterally. Patient does sit with his legs externally rotated. Negative Faber test. No tenderness with stress of the adductor's. Full strength of the lower extremities that are symmetric bilaterally with deep tendon reflexes intact.  Procedure note [id number]; 15 minutes spent for Therapeutic exercises as stated in above notes.  This included exercises focusing on stretching, strengthening, with significant focus on eccentric aspects.  Hip strengthening exercises which included:  Pelvic tilt/bracing to help with proper recruitment of the lower abs and pelvic floor muscles  Glute strengthening to properly contract glutes without over-engaging low back and hamstrings - prone hip extension and glute bridge exercises Proper stretching techniques to increase effectiveness for the hip flexors, groin, quads, piriformic and low back when appropriate    Proper technique shown and discussed handout in great detail with ATC.  All questions were discussed and answered.     Impression and Recommendations:     This case required medical decision making of moderate complexity.      Note: This dictation was prepared with Dragon dictation along with smaller phrase technology. Any transcriptional errors that result from this process are unintentional.

## 2015-10-06 NOTE — Progress Notes (Signed)
Pre visit review using our clinic review tool, if applicable. No additional management support is needed unless otherwise documented below in the visit note. 

## 2015-10-06 NOTE — Assessment & Plan Note (Signed)
Patient continues to work on weight loss. Having significant difficulty with this at the moment. Patient has changed his diet significantly and continues to try to work out on a regular basis. We will check some other labs to see if anything else is contributing. We did check some of them 3 months ago including as thyroid and testosterone that were borderline. We'll see if there is any changes at this time. Encourage patient to try some over-the-counter medications and some other diet changes that we discussed with patient in great detail today. Patient and will come back and see me again in 1 months.

## 2015-10-06 NOTE — Telephone Encounter (Signed)
Overdue for 6 mo f/u please schedule appt for request

## 2015-10-06 NOTE — Telephone Encounter (Signed)
Patient is requesting adderall refill.  Has appt scheduled for the 25th.

## 2015-10-06 NOTE — Assessment & Plan Note (Signed)
Patient is having more of the hip flexor tendinitis. I do believe that this is mostly a muscle injury. Patient does have some mild abdominal pain. There is a possibility for patient having chronic appendicitis. Patient does not have any severe involuntary guarding but is tender to palpation over McBurney's point. We discussed that advance imaging could be warranted but which patient declined. Patient knows that he needs to seek medical attention if worsening symptoms. We discussed different medications a could be beneficial. Work with Product/process development scientist to learn home exercises in greater detail. We discussed possible compression. Follow-up again in 3-4 weeks for further evaluation and treatment.

## 2015-10-07 LAB — ANA: Anti Nuclear Antibody(ANA): NEGATIVE

## 2015-10-07 LAB — TESTOSTERONE,FREE AND TOTAL
TESTOSTERONE: 265 ng/dL — AB (ref 348–1197)
Testosterone, Free: 4.4 pg/mL — ABNORMAL LOW (ref 7.2–24.0)

## 2015-10-07 MED ORDER — AMPHETAMINE-DEXTROAMPHET ER 20 MG PO CP24: 20.0000 mg | ORAL_CAPSULE | Freq: Every day | ORAL | Status: DC

## 2015-10-07 NOTE — Telephone Encounter (Signed)
Notified pt rx ready for pick-up.../lmb 

## 2015-10-07 NOTE — Telephone Encounter (Addendum)
Please advise appt is on Thursday

## 2015-10-07 NOTE — Telephone Encounter (Signed)
rx written

## 2015-10-08 ENCOUNTER — Encounter: Payer: Self-pay | Admitting: Internal Medicine

## 2015-10-08 ENCOUNTER — Ambulatory Visit (INDEPENDENT_AMBULATORY_CARE_PROVIDER_SITE_OTHER): Payer: 59 | Admitting: Internal Medicine

## 2015-10-08 VITALS — BP 116/72 | HR 83 | Temp 98.2°F | Ht 70.0 in | Wt 240.0 lb

## 2015-10-08 DIAGNOSIS — F900 Attention-deficit hyperactivity disorder, predominantly inattentive type: Secondary | ICD-10-CM | POA: Diagnosis not present

## 2015-10-08 DIAGNOSIS — E1065 Type 1 diabetes mellitus with hyperglycemia: Secondary | ICD-10-CM

## 2015-10-08 DIAGNOSIS — E785 Hyperlipidemia, unspecified: Secondary | ICD-10-CM

## 2015-10-08 MED ORDER — AMPHETAMINE-DEXTROAMPHETAMINE 5 MG PO TABS: 5.0000 mg | ORAL_TABLET | Freq: Every day | ORAL | Status: DC

## 2015-10-08 MED FILL — DEXTROAMP-AMPHETAMINE 5 MG: 5 | 30 days supply | Qty: 30 | Fill #0

## 2015-10-08 MED FILL — DEXTROAMP-AMPHET ER 20 MG C: 20 | 30 days supply | Qty: 30 | Fill #0

## 2015-10-08 MED FILL — NovoLOG 100 UNIT/ML SOLN: 100 | 4 days supply | Qty: 10 | Fill #8

## 2015-10-08 NOTE — Progress Notes (Signed)
Subjective:  Patient ID: Zachary Graham, male    DOB: 07/20/1960  Age: 55 y.o. MRN: [id number]  CC: Hyperlipidemia and Diabetes   HPI Zachary Graham presents for f/up - He complains of about a 20 pound weight gain. He is been out of her and symptoms of ADHD with lack of focus, difficulty completing tasks, and lack of motivation. When he took the 20 mg or Adderall once a day he did notice later in the afternoon that he had a dip in his focus and energy level and he wants to know if he can take a higher dose to combat this lull that he feels later in the day.  Outpatient Prescriptions Prior to Visit  Medication Sig Dispense Refill  . glucagon (GLUCAGON EMERGENCY) 1 MG injection Inject 1 mg into the muscle once as needed. 2 each prn  . glucose blood (BAYER CONTOUR NEXT TEST) test strip Use 6x a day 300 each 11  . guaiFENesin (MUCINEX) 600 MG 12 hr tablet Take 1 tablet (600 mg total) by mouth 2 (two) times daily. 20 tablet 0  . insulin aspart (NOVOLOG) 100 UNIT/ML injection For use in insulin pump, total 250 units per day 30 vial 11  . Insulin Infusion Pump Supplies (PARADIGM RESERVOIR 3ML) MISC 1 Device by Does not apply route daily.    . INSULIN SYRINGE 1CC/29G 29G X 1/2" 1 ML MISC 1 Syringe by Does not apply route every 3 (three) days. 10 each 3  . losartan (COZAAR) 50 MG tablet Take 1 tablet (50 mg total) by mouth daily. 90 tablet 3  . Melatonin 5 MG CHEW Chew 2 tablets by mouth at bedtime.    . Misc Natural Products (TURMERIC CURCUMIN) CAPS Take 1 capsule by mouth.    . Multiple Vitamins-Minerals (MULTIVITAMIN GUMMIES ADULT) CHEW Chew 1 Dose by mouth daily.    . naproxen (NAPROSYN) 500 MG tablet Take 1 tablet (500 mg total) by mouth 2 (two) times daily with a meal. 30 tablet 0  . ONETOUCH DELICA LANCETS 99991111 MISC Use to test blood sugar 8 times daily as instructed. 300 each 11  . pantoprazole (PROTONIX) 40 MG tablet Take 1 tablet (40 mg total) by mouth daily. 90 tablet 2  . simvastatin (ZOCOR)  40 MG tablet Take 1 tablet (40 mg total) by mouth daily. 90 tablet 3  . amphetamine-dextroamphetamine (ADDERALL XR) 20 MG 24 hr capsule Take 1 capsule (20 mg total) by mouth daily. (Patient not taking: Reported on 10/08/2015) 30 capsule 0  . albuterol (PROVENTIL HFA;VENTOLIN HFA) 108 (90 Base) MCG/ACT inhaler Inhale 2 puffs into the lungs every 4 (four) hours as needed for wheezing or shortness of breath. (Patient not taking: Reported on 10/08/2015) 1 each 6  . Beclomethasone Graham (QNASL) 80 MCG/ACT AERS Place 4 puffs into the nose daily. (Patient not taking: Reported on 10/08/2015) 8.7 g 11   No facility-administered medications prior to visit.    ROS Review of Systems  Constitutional: Positive for fatigue and unexpected weight change. Negative for fever, chills, diaphoresis and appetite change.  HENT: Negative.   Eyes: Negative.  Negative for visual disturbance.  Respiratory: Negative.  Negative for cough, choking, chest tightness, shortness of breath and stridor.   Cardiovascular: Negative.  Negative for chest pain, palpitations and leg swelling.  Gastrointestinal: Negative.  Negative for nausea, vomiting, abdominal pain, diarrhea, constipation and blood in stool.  Endocrine: Negative.  Negative for polydipsia, polyphagia and polyuria.  Genitourinary: Negative.  Negative for difficulty urinating.  Musculoskeletal: Negative.  Negative for myalgias, back pain and neck pain.  Skin: Negative.   Allergic/Immunologic: Negative.   Neurological: Negative.  Negative for dizziness, tremors, weakness, light-headedness, numbness and headaches.  Hematological: Negative.  Negative for adenopathy. Does not bruise/bleed easily.  Psychiatric/Behavioral: Negative.     Objective:  BP 116/72 mmHg  Pulse 83  Temp(Src) 98.2 F (36.8 C) (Oral)  Ht 5\' 10"  (1.778 m)  Wt 240 lb (108.863 kg)  BMI 34.44 kg/m2  SpO2 98%  BP Readings from Last 3 Encounters:  10/08/15 116/72  10/06/15 132/86    07/03/15 118/68    Wt Readings from Last 3 Encounters:  10/08/15 240 lb (108.863 kg)  10/06/15 239 lb (108.41 kg)  07/03/15 221 lb (100.245 kg)    Physical Exam  Constitutional: He is oriented to person, place, and time. No distress.  HENT:  Mouth/Throat: Oropharynx is clear and moist. No oropharyngeal exudate.  Eyes: Conjunctivae are normal. Right eye exhibits no discharge. Left eye exhibits no discharge. No scleral icterus.  Neck: Normal range of motion. Neck supple. No JVD present. No tracheal deviation present. No thyromegaly present.  Cardiovascular: Normal rate, regular rhythm, normal heart sounds and intact distal pulses.  Exam reveals no gallop and no friction rub.   No murmur heard. Pulmonary/Chest: Effort normal and breath sounds normal. No stridor. No respiratory distress. He has no wheezes. He has no rales. He exhibits no tenderness.  Abdominal: Soft. Bowel sounds are normal. He exhibits no distension and no mass. There is no tenderness. There is no rebound and no guarding.  Musculoskeletal: He exhibits no edema or tenderness.  Lymphadenopathy:    He has no cervical adenopathy.  Neurological: He is oriented to person, place, and time.  Skin: Skin is warm and dry. No rash noted. He is not diaphoretic. No erythema. No pallor.  Vitals reviewed.   Lab Results  Component Value Date   WBC 8.1 12/27/2013   HGB 13.4 12/27/2013   HCT 40.4 12/27/2013   PLT 259.0 12/27/2013   GLUCOSE 136* 01/14/2015   CHOL 127 11/14/2014   TRIG 96.0 11/14/2014   HDL 40.70 11/14/2014   LDLDIRECT 132.1 05/26/2012   LDLCALC 67 11/14/2014   ALT 34 12/27/2013   AST 21 12/27/2013   NA 139 01/14/2015   K 4.3 01/14/2015   CL 103 01/14/2015   CREATININE 0.82 01/14/2015   BUN 14 01/14/2015   CO2 30 01/14/2015   TSH 3.16 10/06/2015   PSA 0.48 01/14/2015   HGBA1C 8.1 05/30/2015   MICROALBUR 0.7 01/14/2015    No results found.  Assessment & Plan:   Zachary Graham was seen today for hyperlipidemia  and diabetes.  Diagnoses and all orders for this visit:  Type 1 diabetes mellitus with hyperglycemia, with long-term current use of insulin ([place])- diabetes is treated with an insulin pump managed by endocrinology, he is due for a quarterly A1c today. -     Comprehensive metabolic panel; Future -     CBC with Differential/Platelet; Future -     Hemoglobin A1c; Future -     Urinalysis, Routine w reflex microscopic (not at [facility]); Future  Hyperlipidemia with target LDL less than 100- will monitor his liver enzymes -     Comprehensive metabolic panel; Future  ADHD (attention deficit hyperactivity disorder), inattentive type- we will restart Adderall at 20 mg a day and I've also asked him to add a 5 mg dose later in the day to treat the symptoms that he experiences in  the afternoon and evening -     amphetamine-dextroamphetamine (ADDERALL) 5 MG tablet; Take 1 tablet (5 mg total) by mouth daily. -     CBC with Differential/Platelet; Future   I have discontinued Mr. Zachary Graham and albuterol. I am also having him start on amphetamine-dextroamphetamine. Additionally, I am having him maintain his PARADIGM RESERVOIR 3ML, INSULIN SYRINGE 1CC/29G, glucagon, insulin aspart, losartan, pantoprazole, simvastatin, ONETOUCH DELICA LANCETS 99991111, glucose blood, guaiFENesin, Melatonin, MULTIVITAMIN GUMMIES ADULT, Turmeric Curcumin, naproxen, amphetamine-dextroamphetamine, Nutritional Supplements (DHEA PO), Brompheniramine-Pseudoeph (BROMALINE PO), and Linoleic Acid-Sunflower Oil (CLA PO).  Meds ordered this encounter  Medications  . Nutritional Supplements (DHEA PO)    Sig: Take by mouth.  . Brompheniramine-Pseudoeph (BROMALINE PO)    Sig: Take by mouth.  . Linoleic Acid-Sunflower Oil (CLA PO)    Sig: Take by mouth.  Marland Kitchen amphetamine-dextroamphetamine (ADDERALL) 5 MG tablet    Sig: Take 1 tablet (5 mg total) by mouth daily.    Dispense:  30 tablet    Refill:  0     Follow-up: Return  in about 6 weeks (around 11/19/2015).  Scarlette Calico, MD

## 2015-10-08 NOTE — Progress Notes (Signed)
Pre visit review using our clinic review tool, if applicable. No additional management support is needed unless otherwise documented below in the visit note. 

## 2015-10-08 NOTE — Patient Instructions (Signed)
Attention Deficit Hyperactivity Disorder Attention deficit hyperactivity disorder (ADHD) is a problem with behavior issues based on the way the brain functions (neurobehavioral disorder). It is a common reason for behavior and academic problems in school. SYMPTOMS  There are 3 types of ADHD. The 3 types and some of the symptoms include:  Inattentive.  Gets bored or distracted easily.  Loses or forgets things. Forgets to hand in homework.  Has trouble organizing or completing tasks.  Difficulty staying on task.  An inability to organize daily tasks and school work.  Leaving projects, chores, or homework unfinished.  Trouble paying attention or responding to details. Careless mistakes.  Difficulty following directions. Often seems like is not listening.  Dislikes activities that require sustained attention (like chores or homework).  Hyperactive-impulsive.  Feels like it is impossible to sit still or stay in a seat. Fidgeting with hands and feet.  Trouble waiting turn.  Talking too much or out of turn. Interruptive.  Speaks or acts impulsively.  Aggressive, disruptive behavior.  Constantly busy or on the go; noisy.  Often leaves seat when they are expected to remain seated.  Often runs or climbs where it is not appropriate, or feels very restless.  Combined.  Has symptoms of both of the above. Often children with ADHD feel discouraged about themselves and with school. They often perform well below their abilities in school. As children get older, the excess motor activities can calm down, but the problems with paying attention and staying organized persist. Most children do not outgrow ADHD but with good treatment can learn to cope with the symptoms. DIAGNOSIS  When ADHD is suspected, the diagnosis should be made by professionals trained in ADHD. This professional will collect information about the individual suspected of having ADHD. Information must be collected from  various settings where the person lives, works, or attends school.  Diagnosis will include:  Confirming symptoms began in childhood.  Ruling out other reasons for the child's behavior.  The health care providers will check with the child's school and check their medical records.  They will talk to teachers and parents.  Behavior rating scales for the child will be filled out by those dealing with the child on a daily basis. A diagnosis is made only after all information has been considered. TREATMENT  Treatment usually includes behavioral treatment, tutoring or extra support in school, and stimulant medicines. Because of the way a person's brain works with ADHD, these medicines decrease impulsivity and hyperactivity and increase attention. This is different than how they would work in a person who does not have ADHD. Other medicines used include antidepressants and certain blood pressure medicines. Most experts agree that treatment for ADHD should address all aspects of the person's functioning. Along with medicines, treatment should include structured classroom management at school. Parents should reward good behavior, provide constant discipline, and set limits. Tutoring should be available for the child as needed. ADHD is a lifelong condition. If untreated, the disorder can have long-term serious effects into adolescence and adulthood. HOME CARE INSTRUCTIONS   Often with ADHD there is a lot of frustration among family members dealing with the condition. Blame and anger are also feelings that are common. In many cases, because the problem affects the family as a whole, the entire family may need help. A therapist can help the family find better ways to handle the disruptive behaviors of the person with ADHD and promote change. If the person with ADHD is young, most of the therapist's   and anger are also feelings that are common. In many cases, because the problem affects the family as a whole, the entire family may need help. A therapist can help the family find better ways to handle the disruptive behaviors of the person with ADHD and promote change. If the person with ADHD is young, most of the therapist's work is with the parents. Parents will learn techniques for coping with and improving their child's behavior.  Sometimes only the child with the ADHD needs counseling. Your health care providers can help you make these decisions.  · Children with ADHD may need help learning how to organize. Some helpful tips include:  ¨ Keep routines the same every day from wake-up time to bedtime. Schedule all activities, including homework and playtime. Keep the schedule in a place where the person with ADHD will often see it. Terin schedule changes as far in advance as possible.  ¨ Schedule outdoor and indoor recreation.  ¨ Have a place for everything and keep everything in its place. This includes clothing, backpacks, and school supplies.  ¨ Encourage writing down assignments and bringing home needed books. Work with your child's teachers for assistance in organizing school work.  · Offer your child a well-balanced diet. Breakfast that includes a balance of whole grains, protein, and fruits or vegetables is especially important for school performance. Children should avoid drinks with caffeine including:  ¨ Soft drinks.  ¨ Coffee.  ¨ Tea.  ¨ However, some older children (adolescents) may find these drinks helpful in improving their attention. Because it can also be common for adolescents with ADHD to become addicted to caffeine, talk with your health care provider about what is a safe amount of caffeine intake for your child.  · Children with ADHD need consistent rules that they can understand and follow. If rules are followed, give small rewards. Children with ADHD often receive, and expect, criticism. Look for good behavior and praise it. Set realistic goals. Give clear instructions. Look for activities that can foster success and self-esteem. Make time for pleasant activities with your child. Give lots of affection.  · Parents are their children's greatest advocates. Learn as much as possible about ADHD. This helps you become a stronger and better advocate for your child. It also helps you educate your child's teachers and instructors  if they feel inadequate in these areas. Parent support groups are often helpful. A national group with local chapters is called Children and Adults with Attention Deficit Hyperactivity Disorder (CHADD).  SEEK MEDICAL CARE IF:  · Your child has repeated muscle twitches, cough, or speech outbursts.  · Your child has sleep problems.  · Your child has a marked loss of appetite.  · Your child develops depression.  · Your child has new or worsening behavioral problems.  · Your child develops dizziness.  · Your child has a racing heart.  · Your child has stomach pains.  · Your child develops headaches.  SEEK IMMEDIATE MEDICAL CARE IF:  · Your child has been diagnosed with depression or anxiety and the symptoms seem to be getting worse.  · Your child has been depressed and suddenly appears to have increased energy or motivation.  · You are worried that your child is having a bad reaction to a medication he or she is taking for ADHD.     This information is not intended to replace advice given to you by your health care provider. Make sure you discuss any questions you have with your

## 2015-10-11 LAB — DHEA: DHEA: 57 ng/dL — AB (ref 61–1636)

## 2015-10-14 ENCOUNTER — Other Ambulatory Visit (INDEPENDENT_AMBULATORY_CARE_PROVIDER_SITE_OTHER): Payer: 59

## 2015-10-14 ENCOUNTER — Encounter: Payer: Self-pay | Admitting: Internal Medicine

## 2015-10-14 DIAGNOSIS — F900 Attention-deficit hyperactivity disorder, predominantly inattentive type: Secondary | ICD-10-CM

## 2015-10-14 DIAGNOSIS — E785 Hyperlipidemia, unspecified: Secondary | ICD-10-CM

## 2015-10-14 DIAGNOSIS — E1065 Type 1 diabetes mellitus with hyperglycemia: Secondary | ICD-10-CM | POA: Diagnosis not present

## 2015-10-14 LAB — URINALYSIS, ROUTINE W REFLEX MICROSCOPIC
Bilirubin Urine: NEGATIVE
Hgb urine dipstick: NEGATIVE
Ketones, ur: NEGATIVE
Leukocytes, UA: NEGATIVE
NITRITE: NEGATIVE
PH: 6 (ref 5.0–8.0)
RBC / HPF: NONE SEEN (ref 0–?)
SPECIFIC GRAVITY, URINE: 1.01 (ref 1.000–1.030)
Total Protein, Urine: NEGATIVE
URINE GLUCOSE: 100 — AB
Urobilinogen, UA: 0.2 (ref 0.0–1.0)
WBC UA: NONE SEEN (ref 0–?)

## 2015-10-14 LAB — CBC WITH DIFFERENTIAL/PLATELET
BASOS ABS: 0.1 10*3/uL (ref 0.0–0.1)
BASOS PCT: 0.8 % (ref 0.0–3.0)
EOS ABS: 0.5 10*3/uL (ref 0.0–0.7)
Eosinophils Relative: 6.1 % — ABNORMAL HIGH (ref 0.0–5.0)
HCT: 41.9 % (ref 39.0–52.0)
Hemoglobin: 14.1 g/dL (ref 13.0–17.0)
LYMPHS ABS: 2.6 10*3/uL (ref 0.7–4.0)
Lymphocytes Relative: 29 % (ref 12.0–46.0)
MCHC: 33.7 g/dL (ref 30.0–36.0)
MCV: 82 fl (ref 78.0–100.0)
MONO ABS: 0.6 10*3/uL (ref 0.1–1.0)
Monocytes Relative: 7.1 % (ref 3.0–12.0)
NEUTROS ABS: 5 10*3/uL (ref 1.4–7.7)
NEUTROS PCT: 57 % (ref 43.0–77.0)
PLATELETS: 244 10*3/uL (ref 150.0–400.0)
RBC: 5.11 Mil/uL (ref 4.22–5.81)
RDW: 14.3 % (ref 11.5–15.5)
WBC: 8.8 10*3/uL (ref 4.0–10.5)

## 2015-10-14 LAB — COMPREHENSIVE METABOLIC PANEL
ALT: 25 U/L (ref 0–53)
AST: 19 U/L (ref 0–37)
Albumin: 4.1 g/dL (ref 3.5–5.2)
Alkaline Phosphatase: 88 U/L (ref 39–117)
BILIRUBIN TOTAL: 0.8 mg/dL (ref 0.2–1.2)
BUN: 11 mg/dL (ref 6–23)
CHLORIDE: 101 meq/L (ref 96–112)
CO2: 27 meq/L (ref 19–32)
CREATININE: 0.87 mg/dL (ref 0.40–1.50)
Calcium: 9.4 mg/dL (ref 8.4–10.5)
GFR: 97.74 mL/min (ref >60.00)
Glucose, Bld: 201 mg/dL — ABNORMAL HIGH (ref 70–99)
Potassium: 4.3 mEq/L (ref 3.5–5.1)
SODIUM: 136 meq/L (ref 135–145)
Total Protein: 6.6 g/dL (ref 6.0–8.3)

## 2015-10-14 LAB — HEMOGLOBIN A1C: HEMOGLOBIN A1C: 7.8 % — AB (ref 4.6–6.5)

## 2015-11-05 MED FILL — CONTOUR NEXT STRIPS: 50 days supply | Qty: 300 | Fill #0

## 2015-11-05 MED FILL — NovoLOG 100 UNIT/ML SOLN: 100 | 8 days supply | Qty: 20 | Fill #9

## 2015-11-05 MED FILL — ONE TOUCH DELICA 33G LANCET: 37 days supply | Qty: 300 | Fill #0

## 2015-11-10 ENCOUNTER — Other Ambulatory Visit: Payer: Self-pay | Admitting: Internal Medicine

## 2015-11-10 ENCOUNTER — Encounter: Payer: Self-pay | Admitting: Internal Medicine

## 2015-11-10 DIAGNOSIS — F900 Attention-deficit hyperactivity disorder, predominantly inattentive type: Secondary | ICD-10-CM

## 2015-11-10 MED ORDER — AMPHETAMINE-DEXTROAMPHETAMINE 5 MG PO TABS: 5.0000 mg | ORAL_TABLET | Freq: Every day | ORAL | Status: DC

## 2015-11-10 MED ORDER — AMPHETAMINE-DEXTROAMPHET ER 20 MG PO CP24: 20.0000 mg | ORAL_CAPSULE | Freq: Every day | ORAL | Status: DC

## 2015-11-10 NOTE — Telephone Encounter (Signed)
Place rx's up front for pick-up.../lmb 

## 2015-11-11 MED FILL — DEXTROAMP-AMPHETAMINE 5 MG: 5 | 30 days supply | Qty: 30 | Fill #0

## 2015-11-11 MED FILL — NovoLOG 100 UNIT/ML SOLN: 100 | 32 days supply | Qty: 80 | Fill #10

## 2015-11-11 MED FILL — DEXTROAMP-AMPHET ER 20 MG C: 20 | 30 days supply | Qty: 30 | Fill #0

## 2015-11-13 ENCOUNTER — Other Ambulatory Visit: Payer: Self-pay | Admitting: *Deleted

## 2015-11-13 VITALS — BP 110/70 | Ht 70.0 in | Wt 237.6 lb

## 2015-11-13 DIAGNOSIS — E1042 Type 1 diabetes mellitus with diabetic polyneuropathy: Secondary | ICD-10-CM

## 2015-11-13 LAB — POCT GLYCOSYLATED HEMOGLOBIN (HGB A1C): HEMOGLOBIN A1C: 8

## 2015-11-13 LAB — POCT CBG (FASTING - GLUCOSE)-MANUAL ENTRY: Glucose Fasting, POC: 157 mg/dL — AB (ref 70–99)

## 2015-11-13 NOTE — Patient Outreach (Signed)
Zachary Graham) Care Management   11/13/2015  Zachary Graham 18-Sep-1960 [id number]  Zachary Graham is an 55 y.o. male who presents to the [facility] office for routine Link To Wellness follow up for self management assistance with Type II DM, HTN, hyperlipidemia and obesity.  Subjective:  Zachary Graham says he remains very busy at work, he says he has not been as active because it took him several months to recover from pneumonia when he contracted it in January. He has also been on vacation and at a work conference so he has been more inconsistent with following a CHO controlled meal plan. He is requesting a POC Hgb A1C as he feels his blood sugars have been improving over the last month.  Objective:   Review of Systems  Constitutional: Negative.     Physical Exam  Constitutional: He is oriented to person, place, and time. He appears well-developed and well-nourished.  Respiratory: Effort normal.  Neurological: He is alert and oriented to person, place, and time.  Skin: Skin is warm and dry.  Psychiatric: He has a normal mood and affect. His behavior is normal. Judgment and thought content normal.   Filed Weights   11/13/15 1124  Weight: 237 lb 9.6 oz (107.775 kg)  represents 21 lbs weight gain in last 6 months  Filed Vitals:   11/13/15 1124  BP: 110/70   POC postprandial CBG= 157 POC Hgb A1C= 8.0%  Encounter Medications:   Outpatient Encounter Prescriptions as of 11/13/2015  Medication Sig  . amphetamine-dextroamphetamine (ADDERALL XR) 20 MG 24 hr capsule Take 1 capsule (20 mg total) by mouth daily.  Marland Kitchen amphetamine-dextroamphetamine (ADDERALL) 5 MG tablet Take 1 tablet (5 mg total) by mouth daily.  Marland Kitchen glucagon (GLUCAGON EMERGENCY) 1 MG injection Inject 1 mg into the muscle once as needed.  Marland Kitchen glucose blood (BAYER CONTOUR NEXT TEST) test strip Use 6x a day  . insulin aspart (NOVOLOG) 100 UNIT/ML injection For use in insulin pump,  total 250 units per day  . Insulin Infusion Pump Supplies (PARADIGM RESERVOIR 3ML) MISC 1 Device by Does not apply route daily.  . INSULIN SYRINGE 1CC/29G 29G X 1/2" 1 ML MISC 1 Syringe by Does not apply route every 3 (three) days.  Marland Kitchen losartan (COZAAR) 50 MG tablet Take 1 tablet (50 mg total) by mouth daily.  . Melatonin 5 MG CHEW Chew 2 tablets by mouth at bedtime.  . simvastatin (ZOCOR) 40 MG tablet Take 1 tablet (40 mg total) by mouth daily.  . Brompheniramine-Pseudoeph (BROMALINE PO) Take by mouth. Reported on 11/13/2015  . guaiFENesin (MUCINEX) 600 MG 12 hr tablet Take 1 tablet (600 mg total) by mouth 2 (two) times daily. (Patient not taking: Reported on 11/13/2015)  . Linoleic Acid-Sunflower Oil (CLA PO) Take by mouth.  . Misc Natural Products (TURMERIC CURCUMIN) CAPS Take 1 capsule by mouth.  . Multiple Vitamins-Minerals (MULTIVITAMIN GUMMIES ADULT) CHEW Chew 1 Dose by mouth daily.  . naproxen (NAPROSYN) 500 MG tablet Take 1 tablet (500 mg total) by mouth 2 (two) times daily with a meal.  . Nutritional Supplements (DHEA PO) Take by mouth.  Glory Rosebush DELICA LANCETS [id number] MISC Use to test blood sugar 8 times daily as instructed.  . pantoprazole (PROTONIX) 40 MG tablet Take 1 tablet (40 mg total) by mouth daily.   No facility-administered encounter medications on file as of 11/13/2015.    Functional Status:   In your present state of health, do  you have any difficulty performing the following activities: 01/30/2015  Hearing? N  Vision? N  Difficulty concentrating or making decisions? N  Walking or climbing stairs? N  Dressing or bathing? N  Doing errands, shopping? N    Fall/Depression Screening:    PHQ 2/9 Scores 01/30/2015 01/16/2015 12/20/2014 06/26/2014 07/25/2013 06/26/2012  PHQ - 2 Score 0 0 0 0 0 0    Assessment:   [employer] employee and Link To Wellness member with Type I DM, HTN, hyperlipidemia, and obesity. On insulin pump therapy  Plan:  [facility] CM Care Plan Problem One         Most Recent Value   Care Plan Problem One  Patient with longstanding Type 1.5 DM being managed as Type I, on insulin pump,  with current POC Hgb A1C= 8.0% on 11/13/15, also with HTN and hyperlipidemia and meeting treatment targets as evidenced by BP readings consistently <140/<90 and normal lipid profile on 11/14/14, with obesity with current BMI= 34.09 and weight gain of >20 lbs over the last 6 months   Role Documenting the Problem One  Care Management Coordinator   Care Plan for Problem One  Active   [facility] Long Term Goal (31-90 days)  Improved control of Type I DM as evidenced by improved Hgb A1C at the next assessment without increased episodes of hypoglycemia ( no more than 4 per week) and no severe hypoglycemia, ongoing good control of HTN and hyperlipidemia as evidenced by normal BP readings and normal lipid profile at each assessment , evidence of weight loss or no further weight gain at next visit    [place] Goal Start Date  11/13/15   Interventions for Problem One Long Term Goal  assessed blood sugar readings, meal plan and exercise regimen, reviewed reasons for increased blood sugars and Hgb A1C ( illness, not able to exercise, pump malfunction), discussed sick day rules, discussed strategies to improve blood sugar control, discussed weight loss strategies, provided Elta Guadel[place] with web address for information  on the Medtronic Minimed 670G closed hybrid system, reviewed upcoming appointments with various health care providers, will arrange for Link To Wellness follow up in 3 months     RNCM to fax today's office visit note to Dr. Ronnald Ramp. RNCM will meet quarterly and as needed with patient per Link To Wellness program guidelines to assist with Type II DM, HTN, hyperlipidemia and obesity self-management and assess patient's progress toward mutually set goals.  Barrington Ellison RN,CCM,CDE [place] [facility] Coordinator Link To Wellness Office Phone [phone] Office Fax  [phone]

## 2015-11-17 ENCOUNTER — Other Ambulatory Visit: Payer: Self-pay | Admitting: Internal Medicine

## 2015-11-17 ENCOUNTER — Encounter: Payer: Self-pay | Admitting: Internal Medicine

## 2015-11-17 ENCOUNTER — Encounter: Payer: Self-pay | Admitting: Podiatry

## 2015-11-17 ENCOUNTER — Ambulatory Visit (INDEPENDENT_AMBULATORY_CARE_PROVIDER_SITE_OTHER): Payer: 59 | Admitting: Internal Medicine

## 2015-11-17 ENCOUNTER — Ambulatory Visit (INDEPENDENT_AMBULATORY_CARE_PROVIDER_SITE_OTHER): Payer: 59 | Admitting: Podiatry

## 2015-11-17 VITALS — BP 148/90 | HR 79 | Ht 70.0 in | Wt 238.0 lb

## 2015-11-17 VITALS — BP 142/74 | HR 72

## 2015-11-17 DIAGNOSIS — S90424A Blister (nonthermal), right lesser toe(s), initial encounter: Secondary | ICD-10-CM | POA: Diagnosis not present

## 2015-11-17 DIAGNOSIS — E1065 Type 1 diabetes mellitus with hyperglycemia: Secondary | ICD-10-CM

## 2015-11-17 MED ORDER — METFORMIN HCL 500 MG PO TABS: 1000.0000 mg | ORAL_TABLET | Freq: Every day | ORAL | Status: DC

## 2015-11-17 MED FILL — SIMVASTATIN 40 MG TABLET: 40 | 90 days supply | Qty: 90 | Fill #0

## 2015-11-17 NOTE — Progress Notes (Addendum)
Patient ID: Zachary Graham, male   DOB: Oct 02, 1960, 55 y.o.   MRN: [id number]  HPI: Zachary Graham is a 55 y.o.-year-old male, returning for f/u for DM1, dx 1993, insulin-dependent, uncontrolled, with complications (mild sensory peripheral neuropathy; hypoglycemia episodes). Last visit 4.5 mo ago.  He is still very busy at work, and many times comes home at 11 PM. He does not have too much time to eat at work. He may sleep <4h a night. He was taking Adderall >> lost 60 lbs since he started. He also started to exercise: gym, running. He came off Adderall, but recently restarted here.  He has knee pain.  He also had a blood blister on L foot from exercise >> saw his podiatrist today.  Last hemoglobin A1c was: Lab Results  Component Value Date   HGBA1C 8.0 11/13/2015   HGBA1C 7.8* 10/14/2015   HGBA1C 8.1 05/30/2015  04/05/2014: HbA1c 7.3%  Pt is on an insulin pump since 2006, currently on Paradigm 751.  His pump malfunctioned >> replaced 05/2015. He had an Enlite CGM (not using - feels readings were not accurate). Also on a Dexcom CGM. Has Novolog in pump.  He tried Metformin in the past >> did not help  Pump settings: - Basal rates: 12 AM to 2 AM: 2.10  2 AM to 4 AM: 2.20 4 AM to 8 AM: 2.85 8 AM to 12 PM: 2.40 12 PM to 4 PM: 1.80 4 PM to 6 PM: 1.95 6 PM to 10:30 PM: 2.10 10:30 PM: 2.15 - bolus: - insulin to carb ratio:  12 AM to 11 AM: 2.0 >> 1.7 11 AM to 5 PM: 2.2 5 PM: 1.5 (if you plan to exercise after dinner: 1.8) - ISF: 10 - target: 100-120 - IOB: 4h TDD basal: 53 units >> 38% >> 26% TDD bolus: 85.7 units >> 62% >> 75% TDD 138 +/- 22 units >> 197+/-26 units  - Bolus wizard: On - Modified bolusing: no - Changes the pump site: every 2 days  Pt checks his sugars 2.9 a day- ave 188+/-96 >> 227 +/- 84 >> 154 +/- 73  >> 154 +/- 57 >> 210 +/- 56 >> 167 +/- 82 >> 168+/-63:  Previously:   - am: 79-183, 324 >> 97-334, 394 >> 59-272 >> 61-196 >> 196-303 >> 95-272, >400 >>  81-295 - 8-9 am: 50x1, 107-270, 320 >> 145, 186-332 >>165, 367 >> 92-242 >> 101-282 >> 180-223 >> 98-153 >> n/c - noon: 63, 81, 200-238 >> 58-239 >> 125, 167 >> 81-133 >> 49x1, 105-219 >> 191-273 >> 110-161 >> 157-190  - before dinner:57, 129-197 >> 150-172 >> 102-265 >> 81-153, 269 >> 57-233 >> 141-247 >> 74-226 >> 127-146, 198  - after dinner: 126-313 >> 140-301 >> 68-142, 344 >> 48X1, 90-204 >> 132-300 >> 70 (rarely)-337 >> 115-247, 317 - bedtime (12 am): 65-204 (316 x1) >> 108-295 >> 162-303 >> see above >> 62-181 >> 130-250 >> see above - 2 am: 158-370  >> 99, 190-247 >> 217, 242 >> 62-230 >> 162-211 >> 189-278 >> 128-197, 337 >> 104-396  - 4 am: 146-293 >> 149-235 >> 188-241 >> see above >> 256, 360 >> 79-234, 369 >> 147-233, 301 >> see above  He has a history of severe hypoglycemia episodes in 2015. He never had DKA.  - no CKD, last BUN/creatinine:  Lab Results  Component Value Date   BUN 11 10/14/2015   CREATININE 0.87 10/14/2015  he is on Cozaar. - last set  of lipids: Lab Results  Component Value Date   CHOL 127 11/14/2014   HDL 40.70 11/14/2014   LDLCALC 67 11/14/2014   LDLDIRECT 132.1 05/26/2012   TRIG 96.0 11/14/2014   CHOLHDL 3 11/14/2014  he is on Zocor. - last eye exam was in 08/2015 -  No DR. - + numbness and tingling in his feet.   He also has a history of GERD, hypertension, hyperlipidemia, OSA.   I reviewed pt's medications, allergies, PMH, social hx, family hx, and changes were documented in the history of present illness. Otherwise, unchanged from my initial visit note.  ROS: Constitutional: + weight gain, no fatigue, no subjective hyperthermia/hypothermia Eyes: no blurry vision, no xerophthalmia ENT: no sore throat, no nodules palpated in throat, no dysphagia/odynophagia, no hoarseness Cardiovascular: no CP/SOB/palpitations/no leg swelling Respiratory: no cough/SOB Gastrointestinal: no N/V/D/C Musculoskeletal: no muscle /+ joint aches Skin: no  rashes Neurological: no tremors/numbness/tingling/dizziness  PE: BP 148/90 mmHg  Pulse 79  Ht 5\' 10"  (1.778 m)  Wt 238 lb (107.956 kg)  BMI 34.15 kg/m2  SpO2 96% Body mass index is 34.15 kg/(m^2).  Wt Readings from Last 3 Encounters:  11/17/15 238 lb (107.956 kg)  11/13/15 237 lb 9.6 oz (107.775 kg)  10/08/15 240 lb (108.863 kg)   Constitutional: obese, in NAD Eyes: PERRLA, EOMI, no exophthalmos ENT: moist mucous membranes, no thyromegaly, no cervical lymphadenopathy Cardiovascular: RRR, No MRG Respiratory: CTA B Gastrointestinal: abdomen soft, NT, ND, BS+ Musculoskeletal: no deformities, strength intact in all 4 Skin: moist, warm, no rashes  ASSESSMENT: 1. DM2, insulin-dependent, uncontrolled, without complications - on pump - On CGM - Dexcom. He had an Enlite before, which found less accurate  - Barriers to good control:  Very busy schedule >> less time to check sugars and eat but But he does a better job with bolusing during the day >> in fact, he has a lot of large boluses during the day, sometimes without documented carbs or sugars  Lack of sleep >> only sleeps 4h a night (!) and tries to catch up in the weekend  Reaches home late at night  - sometimes at 1 am >> eats most of the daily calories then  - sometimes forgets to bolus and boluses after he eats  PLAN:  1. Patient with long-standing type 1 diabetes, on an insulin pump. He has increased insulin resistance and requires a large amount of insulin per day. Since last visit, he stopped Adderall, and he gained some weight and his sugars were worse. He restarted the other oral and feels that his sugars are improving but he is not hungry anymore and he tells me that he again doesn't eat during the day only when he gets home at night. - He continues to have high sugars over night as he gets home late at night and he eats repeatedly after he gets home.  - He only gets 26% of daily insulin from basal rates, and the rest  from boluses. I believe he over boluses, but for now, I will increase his basal rates overnight to help with high sugars during this period. To help with his high sugars overnight and to help decrease his insulin resistance, I suggested that he retried metformin. He agrees with this. -  I again advised him to increase his insulin to carb ratio before dinner he plans to exercise afterwards. He stops the pump during exercise , however, he still wears the CGM and reattaching the pump if he sees that his sugars  are getting high during exercise. - He is interested in a new Medtronic pump and I gave him a brochure about it with the name of the Medtronic rep - I advised him to:  Patient Instructions  Please change the pump settings: - Basal rates: 12 AM to 2 AM: 2.10 >> 2.3 2 AM to 5 AM: 2.20 >> 2.4 4 AM to 8 AM: 2.85 8 AM to 12 PM: 2.40 12 PM to 4 PM: 1.80 4 PM to 6 PM: 1.95  6 PM to 10:30 PM: 2.10 >> 2.2 10:30 PM: 2.15 >> 2.3 - bolus: - insulin to carb ratio:  12 AM to 11 AM: 1.7 11 AM to 5 PM: 2.2 5 PM: 1.5 (if you plan to exercise after dinner: 1.8) - ISF: 10 - target: 100-120 - IOB: 4h  Please add Metformin 500 mg daily at dinner and increase to 1000 mg in 3 days if no GI symptoms.   Please return in 3 months with your sugar log.   -  We reviewed together his previous hemoglobin A1c (8.0%) - Return in about 3 months (around 02/17/2016).   - time spent with the patient: 40 min, of which >50% was spent in reviewing his pump download, discussing his hypo- and hyper-glycemic episodes, reviewing previous labs and pump settings and developing a plan to avoid hypo- an hyper-glycemia.

## 2015-11-17 NOTE — Patient Instructions (Signed)
Seen for abnormal spot on 4th toe right.  Noted of old blister dried out. Dry skin removed. Return as needed.

## 2015-11-17 NOTE — Patient Instructions (Addendum)
Please change the pump settings: - Basal rates: 12 AM to 2 AM: 2.10 >> 2.3 2 AM to 5 AM: 2.20 >> 2.4 4 AM to 8 AM: 2.85 8 AM to 12 PM: 2.40 12 PM to 4 PM: 1.80 4 PM to 6 PM: 1.95  6 PM to 10:30 PM: 2.10 >> 2.2 10:30 PM: 2.15 >> 2.3 - bolus: - insulin to carb ratio:  12 AM to 11 AM: 1.7 11 AM to 5 PM: 2.2 5 PM: 1.5 (if you plan to exercise after dinner: 1.8) - ISF: 10 - target: 100-120 - IOB: 4h  Please add Metformin 500 mg daily at dinner and increase to 1000 mg in 3 days if no GI symptoms.   Please return in 3 months with your sugar log.

## 2015-11-17 NOTE — Progress Notes (Signed)
Subjective: 55 year old male presents complaining of a dark spot on 4th toe right duration of a week.  Wearing Orthotics and doing well  Been out of town and doing much walking.   Objective: Neurovascular status are within normal. No edema or erythema noted. Dermatologic: Dry old blister at distal plantar aspect 4th toe right.  Orthopedic: Tight Achilles tendon on both feet. Adducted forefoot, weak and elevated first ray bilateral, medially deviating 2nd digit L>R, mild hallux abductus left foot.  Assessment: Dry old blister 4th toe right duration of a week.  Co existing conditions include; 1. Ankle equinus bilateral. 2. Cavovarus foot. 3. Hypermobile first ray bilateral. 4. Deviating 2nd digit left with intrinsic muscle imbalance.  Plan: Reviewed clinical findings and available treatment options. Removed dry blister.

## 2015-11-19 ENCOUNTER — Ambulatory Visit (INDEPENDENT_AMBULATORY_CARE_PROVIDER_SITE_OTHER): Payer: 59 | Admitting: Family Medicine

## 2015-11-19 ENCOUNTER — Encounter: Payer: Self-pay | Admitting: Family Medicine

## 2015-11-19 VITALS — BP 130/82 | HR 84 | Ht 70.0 in | Wt 238.0 lb

## 2015-11-19 DIAGNOSIS — M216X2 Other acquired deformities of left foot: Secondary | ICD-10-CM | POA: Diagnosis not present

## 2015-11-19 DIAGNOSIS — M222X2 Patellofemoral disorders, left knee: Secondary | ICD-10-CM | POA: Diagnosis not present

## 2015-11-19 DIAGNOSIS — M216X1 Other acquired deformities of right foot: Secondary | ICD-10-CM

## 2015-11-19 DIAGNOSIS — E669 Obesity, unspecified: Secondary | ICD-10-CM | POA: Diagnosis not present

## 2015-11-19 NOTE — Patient Instructions (Addendum)
Good to see you  The knee consider adding more of the bike.  3 days of lifting and up to 4 days of cardio.  One day of rest  Consider doing more interval training with the cardoi.  1 minute slow, then 1 minute moderate intenistiy until too tired.  The each day thry to increase minute.  For weight lifting pick 5 exercises do 12 reps of each and 3 sets.  Do them continuously without stopping if you can  Iver Nestle at family medicine residency could be good to help with the diet some.  It will be difficult with all the life changes.  Stop the DHEA for now and see how you feel Send me a message in 3 weeks and see me again in 6 weeks.

## 2015-11-19 NOTE — Progress Notes (Signed)
[name] [address], [place] [address] Phone: [phone] Subjective:     [id number] right hip pain, right  [id number] Zachary Graham is a 55 y.o. male coming in with complaint of  Right hip pain. Patient sent have more tendinitis of the right hip. Patient was given home exercises, icing, we discussed topical anti-inflammatories and over-the-counter medications. Patient continued to be active. Patient states he continues tended difficult he with weight loss. Patient states that unfortunately continues to have a significant stressors in his life. Patient will be moving houses in the next 3 months and patient does have a large presentation in a national conference that he is concerned about. Because of this he has not been working out as regularly. Not sticking quite as much to his diet. Mild increasing knee pain as well. Nonetheless side only.    Past Medical History  Diagnosis Date  . Diabetes mellitus   . GERD (gastroesophageal reflux disease)   . Hypertension   . Hyperlipidemia   . OSA (obstructive sleep apnea)    Past Surgical History  Procedure Laterality Date  . Tonsillectomy     Social History   Social History  . Marital Status: Married    Spouse Name: N/A  . Number of Children: 3  . Years of Education: N/A   Occupational History  . RADIATION SAFETY OFFICER [employer]   Social History Main Topics  . Smoking status: Never Smoker   . Smokeless tobacco: Never Used  . Alcohol Use: No  . Drug Use: No  . Sexual Activity:    Partners: Female   Other Topics Concern  . None   Social History Narrative   Regular exercise: runs 3 miles 3x week, takes one day off per week to rest   Caffeine use: stopped all diet soda on 04/23/15 per advice from Sports MD   Allergies  Allergen Reactions  . Zetia [Ezetimibe]     abd cramps  . Lisinopril   . Penicillins   . Shellfish Allergy    Family History  Problem Relation Age of  Onset  . Colon cancer Maternal Grandfather   . Colon cancer Paternal Grandfather   . Alcohol abuse Neg Hx   . Asthma Neg Hx   . Diabetes Neg Hx   . Heart disease Father     Valve replacement; pacemaker  . Hyperlipidemia Neg Hx   . Hypertension Neg Hx   . Kidney disease Neg Hx   . Stroke Neg Hx     Past medical history, social, surgical and family history all reviewed in electronic medical record.  No pertanent information unless stated regarding to the chief complaint.   Review of Systems: No headache, visual changes, nausea, vomiting, diarrhea, constipation, dizziness, abdominal pain, skin rash, fevers, chills, night sweats, weight loss, swollen lymph nodes, body aches, joint swelling, muscle aches, chest pain, shortness of breath, mood changes.   Objective Blood pressure 130/82, pulse 84, height 5\' 10"  (1.778 m), weight 238 lb (107.956 kg), SpO2 97 %.  General: No apparent distress alert and oriented x3 mood and affect normal, dressed appropriately.  HEENT: Pupils equal, extraocular movements intact  Respiratory: Patient's speak in full sentences and does not appear short of breath  Cardiovascular: No lower extremity edema, non tender, no erythema  Skin: Warm dry intact with no signs of infection or rash on extremities or on axial skeleton.  Abdomen: Tender to palpation in the right lower quadrant. Minimal involuntary guarding  noted. No rebound tenderness. No pain to percussion. Neuro: Cranial nerves II through XII are intact, neurovascularly intact in all extremities with 2+ DTRs and 2+ pulses.  Lymph: No lymphadenopathy of posterior or anterior cervical chain or axillae bilaterally.  Gait very mild antalgic gait.  MSK:  Non tender with full range of motion and good stability and symmetric strength and tone of shoulders, elbows, wrist, hip, and ankles bilaterally.  Back exam shows the patient does have tightness of the hip flexor. Negative straight leg test but tight hamstrings  bilaterally. Patient does sit with his legs externally rotated. Negative Faber test. No tenderness with stress of the adductor's. Full strength of the lower extremities that are symmetric bilaterally with deep tendon reflexes intact.   Left Knee exam shows the patient does have some lateral tracking of the knees bilaterally. Mild crepitus on the left side. Minimal tenderness to palpation in the area. No radiation down the leg.contralateral knee unremarkable    Impression and Recommendations:     This case required medical decision making of moderate complexity.      Note: This dictation was prepared with Dragon dictation along with smaller phrase technology. Any transcriptional errors that result from this process are unintentional.

## 2015-11-19 NOTE — Assessment & Plan Note (Signed)
Encouraged patient to do the home exercises more regular basis. We discussed icing regimen which activities are doing which ones to avoid. Patient will stay active overall. Patient come back and see me again in 4-6 weeks. Patient was given the opportunity for brace which she declined.

## 2015-11-19 NOTE — Progress Notes (Signed)
Pre visit review using our clinic review tool, if applicable. No additional management support is needed unless otherwise documented below in the visit note. 

## 2015-11-19 NOTE — Assessment & Plan Note (Signed)
Patient continues to have difficulty overall. We discussed with patient about nutritional counseling. Patient will try new nutritionists and will be referred today. We discussed proper timing of 7. We discussed the importance of protein. Patient will continue to try to make these changes.  Stresses contributing. Patient and will come back and see me again in 6 weeks for further evaluation.

## 2015-11-19 NOTE — Assessment & Plan Note (Signed)
Encourage him to continue the custom orthotics.

## 2015-11-20 ENCOUNTER — Other Ambulatory Visit: Payer: Self-pay | Admitting: Internal Medicine

## 2015-11-20 MED FILL — PANTOPRAZOLE SOD DR 40 MG T: 40 | 90 days supply | Qty: 90 | Fill #0

## 2015-11-28 ENCOUNTER — Other Ambulatory Visit: Payer: Self-pay | Admitting: Internal Medicine

## 2015-11-28 NOTE — Telephone Encounter (Signed)
Forwarding to ENDO provider

## 2015-12-08 MED FILL — NovoLOG 100 UNIT/ML SOLN: 100 | 24 days supply | Qty: 60 | Fill #0

## 2015-12-08 MED FILL — NAPROXEN 500 MG TABLET: 500 | 15 days supply | Qty: 30 | Fill #0

## 2015-12-15 ENCOUNTER — Encounter: Payer: Self-pay | Admitting: Internal Medicine

## 2015-12-17 ENCOUNTER — Other Ambulatory Visit: Payer: Self-pay | Admitting: Internal Medicine

## 2015-12-17 DIAGNOSIS — F900 Attention-deficit hyperactivity disorder, predominantly inattentive type: Secondary | ICD-10-CM

## 2015-12-18 ENCOUNTER — Other Ambulatory Visit: Payer: Self-pay | Admitting: Internal Medicine

## 2015-12-18 DIAGNOSIS — F900 Attention-deficit hyperactivity disorder, predominantly inattentive type: Secondary | ICD-10-CM

## 2015-12-18 MED ORDER — AMPHETAMINE-DEXTROAMPHET ER 20 MG PO CP24: 20.0000 mg | ORAL_CAPSULE | Freq: Every day | ORAL | 0 refills | Status: DC

## 2015-12-18 MED ORDER — AMPHETAMINE-DEXTROAMPHETAMINE 5 MG PO TABS: 5.0000 mg | ORAL_TABLET | Freq: Every day | ORAL | 0 refills | Status: DC

## 2015-12-18 NOTE — Telephone Encounter (Signed)
Place rx up front for pick-up.../lmb 

## 2015-12-19 MED FILL — DEXTROAMP-AMPHETAMINE 5 MG: 5 | 30 days supply | Qty: 30 | Fill #0

## 2015-12-19 MED FILL — DEXTROAMP-AMPHET ER 20 MG C: 20 | 30 days supply | Qty: 30 | Fill #0

## 2015-12-29 ENCOUNTER — Ambulatory Visit (INDEPENDENT_AMBULATORY_CARE_PROVIDER_SITE_OTHER): Payer: 59 | Admitting: Family Medicine

## 2015-12-29 ENCOUNTER — Encounter: Payer: Self-pay | Admitting: Family Medicine

## 2015-12-29 DIAGNOSIS — E785 Hyperlipidemia, unspecified: Secondary | ICD-10-CM

## 2015-12-29 DIAGNOSIS — E669 Obesity, unspecified: Secondary | ICD-10-CM | POA: Diagnosis not present

## 2015-12-29 NOTE — Progress Notes (Signed)
Medical Nutrition Therapy:  Appt start time: 1000 end time:  1100.  Assessment:  Primary concerns today: Weight management and Blood sugar control.  Zachary Graham would like to be under 200 lb.    Learning Readiness: Ready  Usual eating pattern includes 2 meals (Nutrisystem bar for bkfst) and 3-4 snacks per day. Frequent foods and beverages include ~60 oz diet soda per day, water, Nutrisystem bar, Fiber One bar, chips, Nutrisystem meals, [place] hotdog w/ chs 3 X wk, 2 slc veg pizza ~1 X every 10 days (spikes BG).  Avoided foods include okra, Br sprouts, radishes (dislies), shellfish (allergy), fruit.   Usual physical activity includes none currently.  He has participated in [place] said he has had some numbness in his feet and has been very tired recently.  He re-started taking Metformin for ~10 weeks, and only recently discovered he'd been taking Gabapentin instead of Metformin b/c his dad had placed the pills in the wrong bottle.  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  Zachary Graham is originally from [place], where his wife and daughter are still.  They will move to [place] now that his daughter has graduated from college.  He is currently in a hotel with his dad, so usually eats at [employer] or eats [employer] or take-out food.  He had been running, but started having knee pain, for which he saw Dr. [name] last fall.  (Also had pneumonia from Jan through Mar of 2017.)    Zachary Graham uses an insulin pump, so he counts carb's, and adjusts boluses for meals.  Lab Results  Component Value Date   HGBA1C 8.0 11/13/2015   HGBA1C 7.8 (H) 10/14/2015   HGBA1C 8.1 05/30/2015   FBG have been 120s-130s.  Also usually checks at bedtime, and an additional 1-2 X during the night, worried that he will be hypoglycemic even though he has not experienced a  nocturnal hypo event in several months (since discontinuing regular exercise).  (CGM has not been working well recently.)  He had to cut back on insulin when he'd lost ~60 lb when he ran.  Has regained ~30 lb now.  Uses a CPAP nightly, which he said is very helpful.    24-hr recall: (Up at 8 AM) B ( AM)-   --- Snk (9 AM)-   12 oz diet soda L (12 PM)-  8 oz Activia yogurt (spent 4+ hours at Urgent Care with dad) Snk ( PM)-  --- D (6 PM)-  [employer] grilled chx sand, diet soda Snk (10 PM)-  1 [place] hotdog, bun, mustard, diet Mtn Dew  Typical day? Yes.   On weekdays days he has more veg's, and yesterday's overall intake was a bit light.    Progress Towards Goal(s):  In progress.   Nutritional Diagnosis:  NB-2.1 Physical inactivity As related to time constraints.  As evidenced by no regular physical activity.    Intervention:  Nutrition education  Handouts given during visit include:  AVS  Demonstrated degree of understanding via:  Teach Back  Barriers to learning/adherence to lifestyle change: Sleep deprivation.    Monitoring/Evaluation:  Dietary intake, exercise, BG, and body weight in 10 week(s).  No appts available sooner.

## 2015-12-29 NOTE — Patient Instructions (Addendum)
  Diet Recommendations for Diabetes   Starchy (carb) foods: Bread, rice, pasta, potatoes, corn, cereal, grits, crackers, bagels, muffins, all baked goods.  (Fruits, milk, and yogurt also have carbohydrate, but most of these foods will not spike your blood sugar as the starchy foods will.)  A few fruits do cause high blood sugars; use small portions of bananas (limit to 1/2 at a time), grapes, watermelon, oranges, and most tropical fruits.   Protein foods: Meat, fish, poultry, eggs, dairy foods, and beans such as pinto and kidney beans (beans also provide carbohydrate).   1. Eat at least 3 meals and 1-2 snacks per day. Never go more than 4-5 hours while awake without eating. Eat breakfast within the first hour of getting up.   2. Limit starchy foods to TWO per meal and ONE per snack. ONE portion of a starchy  food is equal to the following:   - ONE slice of bread (or its equivalent, such as half of a hamburger bun).   - 1/2 cup of a "scoopable" starchy food such as potatoes or rice.   - 15 grams of carbohydrate as shown on food label.  3. Include at every meal: a protein food, a carb food, and vegetables and/or fruit.   - Obtain twice the volume of veg's as protein or carbohydrate foods for both lunch and dinner.     - SLEEP:  You will find weight management much harder until you can get adequate sleep.    - Avoid all sources of caffeine after 3 PM (including colas and Mtn Dew).    - Keep in mind:  1. [facility] Values: Caring for patients, co-workers, and community.   2. We are a health care system, and need to consistently exemplify healthy behaviors, and encourage such among employees.   3. You are insured through [facility], and will be costing the system more in benefits if health deteriorates.    - DIET SODA:  You are not likely to feel different when you include or avoid it, but it may impact your body's carb metabolism.  This is all theory at this point, but a sweet perception with NO  calories/carb's, may cause dysregulation in carb metabolism.   - Colas are probably the worst kind of soda.   - Important concept:  TASTE PREFERENCES ARE LEARNED.  This means that it will get easier to choose foods you know are good for you if you are exposed to them enough.  This includes also learning to NOT be so dependent on foods or drinks that are regulars for you now.

## 2016-01-12 ENCOUNTER — Other Ambulatory Visit: Payer: Self-pay | Admitting: Internal Medicine

## 2016-01-12 ENCOUNTER — Telehealth: Payer: Self-pay | Admitting: Emergency Medicine

## 2016-01-12 DIAGNOSIS — F900 Attention-deficit hyperactivity disorder, predominantly inattentive type: Secondary | ICD-10-CM

## 2016-01-12 MED ORDER — AMPHETAMINE-DEXTROAMPHET ER 20 MG PO CP24: 20.0000 mg | ORAL_CAPSULE | Freq: Every day | ORAL | 0 refills | Status: DC

## 2016-01-12 MED ORDER — AMPHETAMINE-DEXTROAMPHETAMINE 5 MG PO TABS: 5.0000 mg | ORAL_TABLET | Freq: Every day | ORAL | 0 refills | Status: DC

## 2016-01-12 NOTE — Telephone Encounter (Signed)
Please advise at your convenience  LOV: 09/2015

## 2016-01-12 NOTE — Telephone Encounter (Signed)
Pharmacy called and stated pt is going out of town and wanted to know if they can go ahead a get a prescription refill for amphetamine-dextroamphetamine (ADDERALL XR) 20 MG 24 hr capsule and amphetamine-dextroamphetamine (ADDERALL) 5 MG tablet. Please advise thanks.

## 2016-01-12 NOTE — Telephone Encounter (Signed)
YES

## 2016-01-21 MED FILL — CONTOUR NEXT STRIPS: 50 days supply | Qty: 300 | Fill #1

## 2016-01-21 MED FILL — DEXTROAMP-AMPHETAMINE 5 MG: 5 | 30 days supply | Qty: 30 | Fill #0

## 2016-01-21 MED FILL — DEXTROAMP-AMPHET ER 20 MG C: 20 | 30 days supply | Qty: 30 | Fill #0

## 2016-02-03 DIAGNOSIS — E1065 Type 1 diabetes mellitus with hyperglycemia: Secondary | ICD-10-CM | POA: Diagnosis not present

## 2016-02-06 ENCOUNTER — Other Ambulatory Visit: Payer: Self-pay | Admitting: Internal Medicine

## 2016-02-17 ENCOUNTER — Encounter: Payer: Self-pay | Admitting: Internal Medicine

## 2016-02-17 ENCOUNTER — Ambulatory Visit (INDEPENDENT_AMBULATORY_CARE_PROVIDER_SITE_OTHER): Payer: 59 | Admitting: Internal Medicine

## 2016-02-17 DIAGNOSIS — E1165 Type 2 diabetes mellitus with hyperglycemia: Secondary | ICD-10-CM | POA: Diagnosis not present

## 2016-02-17 DIAGNOSIS — R200 Anesthesia of skin: Secondary | ICD-10-CM | POA: Diagnosis not present

## 2016-02-17 DIAGNOSIS — E1065 Type 1 diabetes mellitus with hyperglycemia: Secondary | ICD-10-CM

## 2016-02-17 LAB — POCT GLYCOSYLATED HEMOGLOBIN (HGB A1C): Hemoglobin A1C: 7.8

## 2016-02-17 MED FILL — SIMVASTATIN 40 MG TABLET: 40 | 90 days supply | Qty: 90 | Fill #0

## 2016-02-17 MED FILL — PANTOPRAZOLE SOD DR 40 MG T: 40 | 90 days supply | Qty: 90 | Fill #0

## 2016-02-17 NOTE — Progress Notes (Addendum)
Patient ID: Zachary Graham, male   DOB: [birth year], 55 y.o.   MRN: [id number]  HPI: SOK Zachary Graham is a 55 y.o.-year-old male, returning for f/u for DM1, dx 1993, insulin-dependent, uncontrolled, with complications (mild sensory peripheral neuropathy; hypoglycemia episodes). Last visit 4.5 mo ago.  He is still very busy at work, and many times comes home late at night. No time to exercise anymore.  Since last visit, he wanted to start metformin but he used his father's bottle which contained Gabapentin instead (his father replaced the pill). He obviously did not see an improvement in his blood sugars. It took 3 months until he realized the medication change. At that point, he tried to stop gabapentin but developed numbness in his feet, especially at night. Is worried about this.  Last hemoglobin A1c was: Lab Results  Component Value Date   HGBA1C 8.0 11/13/2015   HGBA1C 7.8 (H) 10/14/2015   HGBA1C 8.1 05/30/2015  04/05/2014: HbA1c 7.3%  Pt is on an insulin pump since 2006, currently on Paradigm 751.  His pump malfunctioned >> replaced 05/2015. He had an Enlite CGM (not using - feels readings were not accurate). Also on a Dexcom CGM. Has Novolog in pump.  He tried Metformin in the past >> did not help >> re-added this 10/2015  Now on: - Pump settings: - Basal rates: 12 AM to 2 AM: 2.3 2 AM to 5 AM: 2.4 4 AM to 8 AM: 2.9   8 AM to 12 PM: 2.40 12 PM to 4 PM: 1.80 4 PM to 6 PM: 1.95  6 PM to 10:30 PM: 2.1 10:30 PM: 2.3 - bolus: - insulin to carb ratio:  12 AM to 11 AM: 1.7 11 AM to 5 PM: 2.2 5 PM: 1.5 (if you plan to exercise after dinner: 1.8) - ISF: 10 - target: 100-120 - IOB: 4h  TDD basal: 24% TDD bolus: 76% TDD 138 +/- 22 units >> 197+/-26 units >> 221.9 +/- 28.4 units   - Bolus wizard: On - Modified bolusing: no - Changes the pump site: every 2 days  Pt checks his sugars 6.1x a day- ave 186+/-63 (will scan the pump downloads): - am: 79-183, 324 >> 97-334, 394 >> 59-272  >> 61-196 >> 196-303 >> 95-272, >400 >> 81-295 >> 101-336 - 8-9 am: 145, 186-332 >>165, 367 >> 92-242 >> 101-282 >> 180-223 >> 98-153 >> n/c >> 113-264 - noon: 58-239 >> 125, 167 >> 81-133 >> 49x1, 105-219 >> 191-273 >> 110-161 >> 157-190 >> 89-228 - before dinner:150-172 >> 102-265 >> 81-153, 269 >> 57-233 >> 141-247 >> 74-226 >> 127-146, 198 >> 133-242 - after dinner: 140-301 >> 68-142, 344 >> 48X1, 90-204 >> 132-300 >> 70 (rarely)-337 >> 115-247, 317 >> 126-252 - bedtime (12 am): 65-204 (316 x1) >> 108-295 >> 162-303 >> see above >> 62-181 >> 130-250 >> see above - 2 am: 158-370  >> 99, 190-247 >> 217, 242 >> 62-230 >> 162-211 >> 189-278 >> 128-197, 337 >> 104-396 >> 144-296, 354 - 4 am: 146-293 >> 149-235 >> 188-241 >> see above >> 256, 360 >> 79-234, 369 >> 147-233, 301 >> see above >> 123, 206 He has a history of severe hypoglycemia episodes in 2015. He never had DKA.  - no CKD, last BUN/creatinine:  Lab Results  Component Value Date   BUN 11 10/14/2015   CREATININE 0.87 10/14/2015  he is on Cozaar. - last set of lipids: Lab Results  Component Value Date   CHOL 127  11/14/2014   HDL 40.70 11/14/2014   LDLCALC 67 11/14/2014   LDLDIRECT 132.1 05/26/2012   TRIG 96.0 11/14/2014   CHOLHDL 3 11/14/2014  he is on Zocor. - last eye exam was in 08/2015 -  No DR. - + numbness and tingling in his feet.   He also has a history of GERD, hypertension, hyperlipidemia, OSA.   Lab Results  Component Value Date   TSH 3.16 10/06/2015   I reviewed pt's medications, allergies, PMH, social hx, family hx, and changes were documented in the history of present illness. Otherwise, unchanged from my initial visit note.  ROS: Constitutional: + weight gain, + fatigue, no subjective hyperthermia/hypothermia Eyes: no blurry vision, no xerophthalmia ENT: no sore throat, no nodules palpated in throat, no dysphagia/odynophagia, no hoarseness Cardiovascular: no CP/SOB/palpitations/no leg  swelling Respiratory: no cough/SOB Gastrointestinal: no N/V/D/C Musculoskeletal: no muscle /joint aches Skin: no rashes Neurological: no tremors/+ numbness/+ tingling/no dizziness  PE: BP (P) 130/80 (BP Location: Left Arm, Patient Position: Sitting)   Pulse (P) 88   Ht (P) 5' 9.5" (1.765 m)   Wt (P) 243 lb (110.2 kg)   SpO2 (P) 96%   BMI (P) 35.37 kg/m  Body mass index is 35.37 kg/m (pended).  Wt Readings from Last 3 Encounters:  02/17/16 (P) 243 lb (110.2 kg)  12/29/15 241 lb 3.2 oz (109.4 kg)  11/19/15 238 lb (108 kg)   Constitutional: obese, in NAD Eyes: PERRLA, EOMI, no exophthalmos ENT: moist mucous membranes, no thyromegaly, no cervical lymphadenopathy Cardiovascular: RRR, No MRG Respiratory: CTA B Gastrointestinal: abdomen soft, NT, ND, BS+ Musculoskeletal: no deformities, strength intact in all 4 Skin: moist, warm, no rashes  ASSESSMENT: 1. DM2, insulin-dependent, uncontrolled, without complications - on pump - On CGM - Dexcom. He had an Enlite before, which found less accurate >> retried this Summer 2017 >> still poor accuracy.  - Barriers to good control:  Very busy schedule >> less time to check sugars and eat but But he does a great job with bolusing during the day >> still has a lot of large boluses during the day, sometimes without documented carbs or sugars  Lack of sleep >> only sleeps max 5h a night (!) and tries to catch up in the weekend  Reaches home late at night  - sometimes at 1 am >> eats >65% of the daily calories then  2. Numbness in feet  PLAN:  1. Patient with long-standing type 1 diabetes, on an insulin pump. He has increased insulin resistance and requires a large amount of insulin per day. Since he stopped Adderall, and he gained weight and his sugars are slightly worse, despite increasing insulin at last visit. He also is extremely busy at work, and nowadays does not have a lot of time to exercise. However, he plan to restart this. - He  continues to have high sugars over night as he gets home late at night and he eats repeatedly after he gets home. Will increase his basal rates further. I also advised him to add metformin, as discussed at last visit. - He only gets 1/4 of daily insulin from basal rates, and the rest from boluses. I believe he over boluses,so  I will increase his basal ratesand decrease his insulin to carb ratio to hopefully decrease the need for repeated boluses.  - He is interested in a new Medtronic pump and I gave him a brochure about it with the name of the Med[employer] rep >> he called and apparently he  may qualify for this in another year.  - I advised him to:  Patient Instructions  Please start:  - Metformin 500 mg daily at dinnertime for 3 days >> then increase to 1000 mg at dinnertime.  Please change the settings as follows: - Basal rates: 12 AM to 2 AM: 2.3 >> 2.5 2 AM to 5 AM: 2.4 >> 2.6 4 AM to 8 AM: 2.9   8 AM to 12 PM: 2.40 >> 2.6 12 PM to 4 PM: 1.80 >> 1.9 4 PM to 6 PM: 1.95 >> 2.1 6 PM to 10:30 PM: 2.1 >> 2.2 10:30 PM: 2.15 >> 2.3 - bolus: - insulin to carb ratio:  12 AM to 11 AM: 1.7 >> 1.5 11 AM to 5 PM: 2.2 >> 2.0 5 PM: 1.5 (if you plan to exercise after dinner: 1.8) - ISF: 10 - target: 100-120 - IOB: 4h  Please start a B complex. Start alpha lipoic acid 600 mg 2x a day.  Restart Neurontin 1 tablet at night x 2 weeks, then 1 tablet every other day for 2 weeks, then stop.  - new hemoglobin A1c >> 7.8% (slightly better) - he is up-to-date with the flu shot and his eye exam - He is due for a cholesterol level, he will return fasting for this - Return in about 3 months (around 05/19/2016).   2. Numbness in feet - He took 3 months of gabapentin by mistake. When he tried to stop the drug, she started to develop numbness in his feet. This is mostly at night.  -  I advised him to start a B complex and alpha lipoic acid but also to restart the gabapentin and to taper it more slowly  -  time spent with the patient: 40 min, of which >50% was spent in reviewing his pump download, discussing his hypo- and hyper-glycemic episodes, reviewing previous labs and pump settings and developing a plan to avoid hypo- an hyper-glycemia.   Cholesterol levels are slightly higher than before. Component     Latest Ref Rng & Units 02/20/2016  Cholesterol     0 - 200 mg/dL 169  Triglycerides     0.0 - 149.0 mg/dL 176.0 (H)  HDL Cholesterol     >39.00 mg/dL 40.80  VLDL     0.0 - 40.0 mg/dL 35.2  LDL (calc)     0 - 99 mg/dL 93  Total CHOL/HDL Ratio      4  NonHDL      128.69    Philemon Kingdom, MD PhD [facility]

## 2016-02-17 NOTE — Patient Instructions (Addendum)
Please start:  - Metformin 500 mg daily at dinnertime for 3 days >> then increase to 1000 mg at dinnertime.  Please change the settings as follows: - Basal rates: 12 AM to 2 AM: 2.3 >> 2.5 2 AM to 5 AM: 2.4 >> 2.6 4 AM to 8 AM: 2.9   8 AM to 12 PM: 2.40 >> 2.6 12 PM to 4 PM: 1.80 >> 1.9 4 PM to 6 PM: 1.95 >> 2.1 6 PM to 10:30 PM: 2.1 >> 2.2 10:30 PM: 2.15 >> 2.3 - bolus: - insulin to carb ratio:  12 AM to 11 AM: 1.7 >> 1.5 11 AM to 5 PM: 2.2 >> 2.0 5 PM: 1.5 (if you plan to exercise after dinner: 1.8) - ISF: 10 - target: 100-120 - IOB: 4h  Please start a B complex. Start alpha lipoic acid 600 mg 2x a day.  Restart Neurontin 1 tablet at night x 2 weeks, then 1 tablet every other day for 2 weeks, then stop.

## 2016-02-19 ENCOUNTER — Other Ambulatory Visit: Payer: Self-pay | Admitting: Internal Medicine

## 2016-02-19 ENCOUNTER — Other Ambulatory Visit: Payer: Self-pay

## 2016-02-19 DIAGNOSIS — F900 Attention-deficit hyperactivity disorder, predominantly inattentive type: Secondary | ICD-10-CM

## 2016-02-19 MED ORDER — METFORMIN HCL 500 MG PO TABS: 1000.0000 mg | ORAL_TABLET | Freq: Every day | ORAL | 3 refills | Status: DC

## 2016-02-19 MED ORDER — NAPROXEN 500 MG PO TABS
500.0000 mg | ORAL_TABLET | Freq: Two times a day (BID) | ORAL | 3 refills | Status: DC
Start: 2016-02-19 — End: 2017-01-24

## 2016-02-19 MED ORDER — AMPHETAMINE-DEXTROAMPHETAMINE 5 MG PO TABS: 5.0000 mg | ORAL_TABLET | Freq: Every day | ORAL | 0 refills | Status: DC

## 2016-02-19 MED FILL — metFORMIN HCL 500 MG TABS: 500 | 90 days supply | Qty: 180 | Fill #0

## 2016-02-19 MED FILL — NAPROXEN 500 MG TABLET: 500 | 15 days supply | Qty: 30 | Fill #0

## 2016-02-19 NOTE — Telephone Encounter (Signed)
Sent pt mychart msg rx reay for pick-up. Place in cabinet up front...Zachary Graham

## 2016-02-20 ENCOUNTER — Other Ambulatory Visit (INDEPENDENT_AMBULATORY_CARE_PROVIDER_SITE_OTHER): Payer: 59

## 2016-02-20 DIAGNOSIS — E1065 Type 1 diabetes mellitus with hyperglycemia: Secondary | ICD-10-CM | POA: Diagnosis not present

## 2016-02-20 LAB — LIPID PANEL
Cholesterol: 169 mg/dL (ref 0–200)
HDL: 40.8 mg/dL (ref >39.00)
LDL Cholesterol: 93 mg/dL (ref 0–99)
NONHDL: 128.69
TRIGLYCERIDES: 176 mg/dL — AB (ref 0.0–149.0)
Total CHOL/HDL Ratio: 4
VLDL: 35.2 mg/dL (ref 0.0–40.0)

## 2016-02-20 MED FILL — DEXTROAMP-AMPHETAMINE 5 MG: 5 | 30 days supply | Qty: 30 | Fill #0

## 2016-03-01 ENCOUNTER — Encounter: Payer: Self-pay | Admitting: Internal Medicine

## 2016-03-01 ENCOUNTER — Ambulatory Visit (INDEPENDENT_AMBULATORY_CARE_PROVIDER_SITE_OTHER): Payer: 59 | Admitting: Internal Medicine

## 2016-03-01 ENCOUNTER — Other Ambulatory Visit (INDEPENDENT_AMBULATORY_CARE_PROVIDER_SITE_OTHER): Payer: 59

## 2016-03-01 VITALS — BP 130/60 | HR 79 | Temp 98.3°F | Ht 70.0 in | Wt 250.5 lb

## 2016-03-01 DIAGNOSIS — E1140 Type 2 diabetes mellitus with diabetic neuropathy, unspecified: Secondary | ICD-10-CM | POA: Insufficient documentation

## 2016-03-01 DIAGNOSIS — Z0000 Encounter for general adult medical examination without abnormal findings: Secondary | ICD-10-CM

## 2016-03-01 DIAGNOSIS — IMO0001 Reserved for inherently not codable concepts without codable children: Secondary | ICD-10-CM

## 2016-03-01 DIAGNOSIS — E1065 Type 1 diabetes mellitus with hyperglycemia: Secondary | ICD-10-CM | POA: Diagnosis not present

## 2016-03-01 DIAGNOSIS — R200 Anesthesia of skin: Secondary | ICD-10-CM | POA: Diagnosis not present

## 2016-03-01 DIAGNOSIS — Z23 Encounter for immunization: Secondary | ICD-10-CM

## 2016-03-01 DIAGNOSIS — F900 Attention-deficit hyperactivity disorder, predominantly inattentive type: Secondary | ICD-10-CM

## 2016-03-01 DIAGNOSIS — R202 Paresthesia of skin: Secondary | ICD-10-CM | POA: Diagnosis not present

## 2016-03-01 DIAGNOSIS — E669 Obesity, unspecified: Secondary | ICD-10-CM | POA: Diagnosis not present

## 2016-03-01 LAB — COMPREHENSIVE METABOLIC PANEL
ALK PHOS: 82 U/L (ref 39–117)
ALT: 30 U/L (ref 0–53)
AST: 20 U/L (ref 0–37)
Albumin: 4.2 g/dL (ref 3.5–5.2)
BILIRUBIN TOTAL: 0.5 mg/dL (ref 0.2–1.2)
BUN: 13 mg/dL (ref 6–23)
CO2: 28 mEq/L (ref 19–32)
CREATININE: 0.84 mg/dL (ref 0.40–1.50)
Calcium: 9.5 mg/dL (ref 8.4–10.5)
Chloride: 99 mEq/L (ref 96–112)
GFR: 101.63 mL/min (ref >60.00)
GLUCOSE: 245 mg/dL — AB (ref 70–99)
Potassium: 4.2 mEq/L (ref 3.5–5.1)
SODIUM: 137 meq/L (ref 135–145)
TOTAL PROTEIN: 6.6 g/dL (ref 6.0–8.3)

## 2016-03-01 LAB — URINALYSIS, ROUTINE W REFLEX MICROSCOPIC
Bilirubin Urine: NEGATIVE
Hgb urine dipstick: NEGATIVE
Ketones, ur: NEGATIVE
Leukocytes, UA: NEGATIVE
Nitrite: NEGATIVE
PH: 6 (ref 5.0–8.0)
RBC / HPF: NONE SEEN (ref 0–?)
Specific Gravity, Urine: 1.015 (ref 1.000–1.030)
TOTAL PROTEIN, URINE-UPE24: NEGATIVE
Urobilinogen, UA: 0.2 (ref 0.0–1.0)
WBC, UA: NONE SEEN (ref 0–?)

## 2016-03-01 LAB — CBC WITH DIFFERENTIAL/PLATELET
BASOS ABS: 0.1 10*3/uL (ref 0.0–0.1)
Basophils Relative: 0.8 % (ref 0.0–3.0)
EOS ABS: 0.5 10*3/uL (ref 0.0–0.7)
EOS PCT: 4.2 % (ref 0.0–5.0)
HCT: 40.4 % (ref 39.0–52.0)
Hemoglobin: 13.8 g/dL (ref 13.0–17.0)
LYMPHS ABS: 2.3 10*3/uL (ref 0.7–4.0)
Lymphocytes Relative: 21.2 % (ref 12.0–46.0)
MCHC: 34.1 g/dL (ref 30.0–36.0)
MCV: 82.5 fl (ref 78.0–100.0)
MONO ABS: 0.9 10*3/uL (ref 0.1–1.0)
Monocytes Relative: 8 % (ref 3.0–12.0)
NEUTROS PCT: 65.8 % (ref 43.0–77.0)
Neutro Abs: 7.1 10*3/uL (ref 1.4–7.7)
Platelets: 233 10*3/uL (ref 150.0–400.0)
RBC: 4.9 Mil/uL (ref 4.22–5.81)
RDW: 13.9 % (ref 11.5–15.5)
WBC: 10.8 10*3/uL — ABNORMAL HIGH (ref 4.0–10.5)

## 2016-03-01 LAB — MICROALBUMIN / CREATININE URINE RATIO
Creatinine,U: 86 mg/dL
MICROALB/CREAT RATIO: 0.8 mg/g (ref 0.0–30.0)
Microalb, Ur: <0.7 mg/dL (ref 0.0–1.9)

## 2016-03-01 LAB — VITAMIN B12: VITAMIN B 12: 658 pg/mL (ref 211–911)

## 2016-03-01 LAB — PSA: PSA: 0.41 ng/mL (ref 0.10–4.00)

## 2016-03-01 LAB — FOLATE: Folate: 21.2 ng/mL (ref >5.9)

## 2016-03-01 LAB — TSH: TSH: 3.01 u[IU]/mL (ref 0.35–4.50)

## 2016-03-01 MED ORDER — AMPHETAMINE-DEXTROAMPHET ER 20 MG PO CP24: 20.0000 mg | ORAL_CAPSULE | Freq: Every day | ORAL | 0 refills | Status: DC

## 2016-03-01 MED ORDER — LORCASERIN HCL ER 20 MG PO TB24: 1.0000 | ORAL_TABLET | Freq: Every day | ORAL | 5 refills | Status: DC

## 2016-03-01 MED FILL — DEXTROAMP-AMPHET ER 20 MG C: 20 | 30 days supply | Qty: 30 | Fill #0

## 2016-03-01 NOTE — Progress Notes (Signed)
Pre visit review using our clinic review tool, if applicable. No additional management support is needed unless otherwise documented below in the visit note. 

## 2016-03-01 NOTE — Progress Notes (Signed)
Subjective:  Patient ID: Zachary Graham, male    DOB: Oct 11, 1960  Age: 55 y.o. MRN: [id number]  CC: Annual Exam   HPI Zachary Graham presents for a CPX. He complains of fatigue, weight gain and trouble keeping his blood sugar down. He is working about 100 hrs/week by his report and feels like he does not have time to exercise, sleep, and exercise. He denies DOE,SOB,diaphoresis, edema. He wants to try a medication to help him lose weight because he consumes a lot of calories late in the day.  He has also developed numbness in his feet (L>R) after mistakegnly his father's supply of gabapentin.  Outpatient Medications Prior to Visit  Medication Sig Dispense Refill  . amphetamine-dextroamphetamine (ADDERALL) 5 MG tablet Take 1 tablet (5 mg total) by mouth daily. 30 tablet 0  . Ascorbic Acid (VITAMIN C) 1000 MG tablet Take 1,000 mg by mouth daily.    . Brompheniramine-Pseudoeph (BROMALINE PO) Take by mouth. Reported on 11/13/2015    . Calcium-Magnesium-Vitamin D (CALCIUM 500 PO) Take by mouth.    . Cholecalciferol (VITAMIN D-3 PO) Take 1,000 mg by mouth daily.    Marland Kitchen DHEA 25 MG CAPS Take 25 mg by mouth.    Marland Kitchen glucagon (GLUCAGON EMERGENCY) 1 MG injection Inject 1 mg into the muscle once as needed. 2 each prn  . glucose blood (BAYER CONTOUR NEXT TEST) test strip Use 6x a day 300 each 11  . Insulin Infusion Pump Supplies (PARADIGM RESERVOIR 3ML) MISC 1 Device by Does not apply route daily.    . INSULIN SYRINGE 1CC/29G 29G X 1/2" 1 ML MISC 1 Syringe by Does not apply route every 3 (three) days. 10 each 3  . losartan (COZAAR) 50 MG tablet Take 1 tablet (50 mg total) by mouth daily. 90 tablet 3  . Melatonin 5 MG CHEW Chew 2 tablets by mouth at bedtime.    . metFORMIN (GLUCOPHAGE) 500 MG tablet Take 2 tablets (1,000 mg total) by mouth daily with supper. 180 tablet 3  . Misc Natural Products (TURMERIC CURCUMIN) CAPS Take 1 capsule by mouth.    . Multiple Vitamins-Minerals (MULTIVITAMIN GUMMIES ADULT) CHEW  Chew 1 Dose by mouth daily.    . naproxen (NAPROSYN) 500 MG tablet Take 1 tablet (500 mg total) by mouth 2 (two) times daily with a meal. 30 tablet 3  . NOVOLOG 100 UNIT/ML injection FOR USE IN INSULIN PUMP, TOTAL 250 UNITS PER DAY 300 mL 11  . ONETOUCH DELICA LANCETS 99991111 MISC Use to test blood sugar 8 times daily as instructed. 300 each 11  . pantoprazole (PROTONIX) 40 MG tablet TAKE 1 TABLET BY MOUTH ONCE DAILY 90 tablet 3  . simvastatin (ZOCOR) 40 MG tablet Take 1 tablet (40 mg total) by mouth daily. 90 tablet 3  . amphetamine-dextroamphetamine (ADDERALL XR) 20 MG 24 hr capsule Take 1 capsule (20 mg total) by mouth daily. 30 capsule 0   No facility-administered medications prior to visit.     ROS Review of Systems  Constitutional: Positive for fatigue and unexpected weight change. Negative for activity change and appetite change.  HENT: Negative.   Eyes: Negative for photophobia and visual disturbance.  Respiratory: Negative.  Negative for cough, choking, chest tightness, shortness of breath and stridor.   Cardiovascular: Negative.  Negative for chest pain, palpitations and leg swelling.  Gastrointestinal: Negative.  Negative for abdominal pain, constipation, diarrhea and nausea.  Endocrine: Negative for cold intolerance, heat intolerance, polydipsia, polyphagia and polyuria.  Genitourinary:  Negative.  Negative for difficulty urinating, dysuria, frequency, penile pain, scrotal swelling, testicular pain and urgency.  Musculoskeletal: Negative.  Negative for myalgias.  Skin: Negative.  Negative for color change and rash.  Allergic/Immunologic: Negative.   Neurological: Negative.   Hematological: Negative.  Negative for adenopathy. Does not bruise/bleed easily.  Psychiatric/Behavioral: Positive for decreased concentration. Negative for agitation, behavioral problems, confusion, self-injury, sleep disturbance and suicidal ideas. The patient is not nervous/anxious and is not hyperactive.      Objective:  BP 130/60 (BP Location: Right Arm, Patient Position: Sitting, Cuff Size: Large)   Pulse 79   Temp 98.3 F (36.8 C) (Oral)   Ht 5\' 10"  (1.778 m)   Wt 250 lb 8 oz (113.6 kg)   SpO2 96%   BMI 35.94 kg/m   BP Readings from Last 3 Encounters:  03/01/16 130/60  02/17/16 (P) 130/80  11/19/15 130/82    Wt Readings from Last 3 Encounters:  03/01/16 250 lb 8 oz (113.6 kg)  02/17/16 (P) 243 lb (110.2 kg)  12/29/15 241 lb 3.2 oz (109.4 kg)    Physical Exam  Constitutional: He is oriented to person, place, and time. He appears well-developed and well-nourished. No distress.  HENT:  Mouth/Throat: Oropharynx is clear and moist. No oropharyngeal exudate.  Eyes: Conjunctivae are normal. Right eye exhibits no discharge. Left eye exhibits no discharge. No scleral icterus.  Neck: Normal range of motion. Neck supple. No JVD present. No tracheal deviation present. No thyromegaly present.  Cardiovascular: Normal rate, regular rhythm, normal heart sounds and intact distal pulses.  Exam reveals no gallop and no friction rub.   No murmur heard. Pulmonary/Chest: Effort normal and breath sounds normal. No stridor. No respiratory distress. He has no wheezes. He has no rales. He exhibits no tenderness.  Abdominal: Soft. Bowel sounds are normal. He exhibits no distension and no mass. There is no tenderness. There is no rebound and no guarding. Hernia confirmed negative in the right inguinal area and confirmed negative in the left inguinal area.  Genitourinary: Rectum normal, prostate normal, testes normal and penis normal. Rectal exam shows no external hemorrhoid, no internal hemorrhoid, no fissure, no mass, no tenderness, anal tone normal and guaiac negative stool. Prostate is not enlarged and not tender. Right testis shows no mass, no swelling and no tenderness. Right testis is descended. Left testis shows no mass, no swelling and no tenderness. Left testis is descended. Circumcised. No penile  erythema or penile tenderness. No discharge found.  Musculoskeletal: Normal range of motion. He exhibits no edema, tenderness or deformity.  Lymphadenopathy:    He has no cervical adenopathy.       Right: No inguinal adenopathy present.       Left: No inguinal adenopathy present.  Neurological: He is alert and oriented to person, place, and time. He has normal reflexes. He displays normal reflexes. No cranial nerve deficit. He exhibits normal muscle tone. Coordination normal.  Skin: Skin is warm and dry. No rash noted. He is not diaphoretic. No erythema. No pallor.  Psychiatric: He has a normal mood and affect. His behavior is normal. Judgment and thought content normal.  Vitals reviewed.   Lab Results  Component Value Date   WBC 8.8 10/14/2015   HGB 14.1 10/14/2015   HCT 41.9 10/14/2015   PLT 244.0 10/14/2015   GLUCOSE 201 (H) 10/14/2015   CHOL 169 02/20/2016   TRIG 176.0 (H) 02/20/2016   HDL 40.80 02/20/2016   LDLDIRECT 132.1 05/26/2012   LDLCALC 93  02/20/2016   ALT 25 10/14/2015   AST 19 10/14/2015   NA 136 10/14/2015   K 4.3 10/14/2015   CL 101 10/14/2015   CREATININE 0.87 10/14/2015   BUN 11 10/14/2015   CO2 27 10/14/2015   TSH 3.16 10/06/2015   PSA 0.48 01/14/2015   HGBA1C 7.8 02/17/2016   MICROALBUR 0.7 01/14/2015    No results found.  Assessment & Plan:   Aydian was seen today for annual exam.  Diagnoses and all orders for this visit:  Type 1 diabetes mellitus with hyperglycemia, with long-term current use of insulin ([place])- insulin pump managed by ENDO -     Microalbumin / creatinine urine ratio; Future  Routine general medical examination at a health care facility- Exam completed, labs ordered and reviewed, vaccines reviewed and updated, patient education material was given. -     Urinalysis, Routine w reflex microscopic (not at [facility]); Future -     Comprehensive metabolic panel; Future -     CBC with Differential/Platelet; Future -     PSA; Future -      TSH; Future -     HIV antibody; Future  Numbness and tingling of both feet- his B12 and folate levels are normal and his labs show no evidence of secondary metabolic causes for this, it is most consistent with diabetic neuropathy, he will continue to take gabapentin as needed. -     Vitamin B12; Future -     Folate; Future  ADHD (attention deficit hyperactivity disorder), inattentive type- he is doing well on the current dose of Adderall, will continue. -     Discontinue: amphetamine-dextroamphetamine (ADDERALL XR) 20 MG 24 hr capsule; Take 1 capsule (20 mg total) by mouth daily. -     Discontinue: amphetamine-dextroamphetamine (ADDERALL XR) 20 MG 24 hr capsule; Take 1 capsule (20 mg total) by mouth daily. -     amphetamine-dextroamphetamine (ADDERALL XR) 20 MG 24 hr capsule; Take 1 capsule (20 mg total) by mouth daily.  Obesity, Class II, BMI 35.0-39.9, with comorbidity (see actual BMI)- in addition to lifestyle modifications she will start velvety to help reduce his caloric intake and assist him in his weight loss goals. -     Lorcaserin HCl ER (BELVIQ XR) 20 MG TB24; Take 1 tablet by mouth daily.   I am having Mr. Gibeault start on Lorcaserin HCl ER. I am also having him maintain his PARADIGM RESERVOIR 3ML, INSULIN SYRINGE 1CC/29G, glucagon, losartan, simvastatin, ONETOUCH DELICA LANCETS 99991111, glucose blood, Melatonin, MULTIVITAMIN GUMMIES ADULT, Turmeric Curcumin, Brompheniramine-Pseudoeph (BROMALINE PO), Cholecalciferol (VITAMIN D-3 PO), Calcium-Magnesium-Vitamin D (CALCIUM 500 PO), DHEA, NOVOLOG, vitamin C, pantoprazole, metFORMIN, naproxen, amphetamine-dextroamphetamine, Alpha-Lipoic Acid, b complex vitamins, and amphetamine-dextroamphetamine.  Meds ordered this encounter  Medications  . Alpha-Lipoic Acid 600 MG CAPS    Sig: Take by mouth.  Marland Kitchen b complex vitamins tablet    Sig: Take 1 tablet by mouth daily.  Marland Kitchen DISCONTD: amphetamine-dextroamphetamine (ADDERALL XR) 20 MG 24 hr capsule    Sig:  Take 1 capsule (20 mg total) by mouth daily.    Dispense:  30 capsule    Refill:  0    Fill on or after 03/01/2016  . DISCONTD: amphetamine-dextroamphetamine (ADDERALL XR) 20 MG 24 hr capsule    Sig: Take 1 capsule (20 mg total) by mouth daily.    Dispense:  30 capsule    Refill:  0    Fill on or after 04/01/2016  . amphetamine-dextroamphetamine (ADDERALL XR) 20 MG 24 hr capsule  Sig: Take 1 capsule (20 mg total) by mouth daily.    Dispense:  30 capsule    Refill:  0    Fill on or after 05/01/2016  . Lorcaserin HCl ER (BELVIQ XR) 20 MG TB24    Sig: Take 1 tablet by mouth daily.    Dispense:  30 tablet    Refill:  5     Follow-up: Return if symptoms worsen or fail to improve.  Scarlette Calico, MD

## 2016-03-01 NOTE — Patient Instructions (Signed)

## 2016-03-02 ENCOUNTER — Encounter: Payer: Self-pay | Admitting: Internal Medicine

## 2016-03-02 ENCOUNTER — Telehealth: Payer: Self-pay

## 2016-03-02 LAB — HIV ANTIBODY (ROUTINE TESTING W REFLEX): HIV 1&2 Ab, 4th Generation: NONREACTIVE

## 2016-03-02 LAB — HEPATITIS C ANTIBODY: HCV Ab: NEGATIVE

## 2016-03-02 NOTE — Telephone Encounter (Signed)
PA for Belviq started via cover my meds.

## 2016-03-05 ENCOUNTER — Encounter: Payer: Self-pay | Admitting: Internal Medicine

## 2016-03-08 ENCOUNTER — Ambulatory Visit: Payer: 59 | Admitting: Family Medicine

## 2016-03-09 MED FILL — BELVIQ XR 20 MG TABLET: 20 | 30 days supply | Qty: 30 | Fill #0

## 2016-03-13 ENCOUNTER — Encounter: Payer: Self-pay | Admitting: Internal Medicine

## 2016-03-25 ENCOUNTER — Ambulatory Visit: Payer: Self-pay | Admitting: *Deleted

## 2016-03-26 MED FILL — CONTOUR NEXT STRIPS: 50 days supply | Qty: 300 | Fill #2

## 2016-03-26 MED FILL — NovoLOG 100 UNIT/ML SOLN: 100 | 24 days supply | Qty: 60 | Fill #1

## 2016-04-07 MED FILL — BELVIQ XR 20 MG TABLET: 20 | 30 days supply | Qty: 30 | Fill #1

## 2016-04-07 MED FILL — DEXTROAMP-AMPHETAMINE 5 MG: 5 | 30 days supply | Qty: 30 | Fill #0

## 2016-04-07 MED FILL — DEXTROAMP-AMPHET ER 20 MG C: 20 | 20 days supply | Qty: 20 | Fill #0

## 2016-04-07 MED FILL — NAPROXEN 500 MG TABLET: 500 | 15 days supply | Qty: 30 | Fill #1

## 2016-04-12 ENCOUNTER — Encounter: Payer: Self-pay | Admitting: Internal Medicine

## 2016-04-19 DIAGNOSIS — I739 Peripheral vascular disease, unspecified: Secondary | ICD-10-CM | POA: Diagnosis not present

## 2016-04-19 DIAGNOSIS — R202 Paresthesia of skin: Secondary | ICD-10-CM | POA: Diagnosis not present

## 2016-04-22 ENCOUNTER — Other Ambulatory Visit: Payer: Self-pay | Admitting: *Deleted

## 2016-04-22 ENCOUNTER — Encounter: Payer: Self-pay | Admitting: *Deleted

## 2016-04-22 VITALS — Ht 70.0 in | Wt 246.8 lb

## 2016-04-22 DIAGNOSIS — E109 Type 1 diabetes mellitus without complications: Secondary | ICD-10-CM

## 2016-04-22 LAB — POCT GLYCOSYLATED HEMOGLOBIN (HGB A1C): HEMOGLOBIN A1C: 7.3

## 2016-04-22 LAB — POCT CBG (FASTING - GLUCOSE)-MANUAL ENTRY: Glucose Fasting, POC: 128 mg/dL — AB (ref 70–99)

## 2016-04-22 NOTE — Patient Outreach (Signed)
[facility]) Care Management   04/22/2016  Zachary Graham 10-20-1960 [id number]  Zachary Graham is an 55 y.o. male who presents to the [facility] office for routine Link To Wellness follow up for self management assistance with Type II DM, HTN, hyperlipidemia and obesity.  Subjective:  Zachary Graham says he remains very busy at work and is not longer exercising because he doesn't have the time. He is requesting a POC Hgb A1C as he feels his blood sugars have been improving over the last month. He says he does not think the Belviq is helping with weight loss.   Objective:   Review of Systems  Constitutional: Negative.     Physical Exam  Constitutional: He is oriented to person, place, and time. He appears well-developed and well-nourished.  Respiratory: Effort normal.  Neurological: He is alert and oriented to person, place, and time.  Skin: Skin is warm and dry.  Psychiatric: He has a normal mood and affect. His behavior is normal. Judgment and thought content normal.   Filed Weights   04/22/16 1147  Weight: 246 lb 12.8 oz (111.9 kg)     POC random CBG= 128 POC Hgb A1C= 7.3%  Encounter Medications:   Outpatient Encounter Prescriptions as of 04/22/2016  Medication Sig  . Alpha-Lipoic Acid 600 MG CAPS Take by mouth.  Marland Kitchen amphetamine-dextroamphetamine (ADDERALL XR) 20 MG 24 hr capsule Take 1 capsule (20 mg total) by mouth daily.  Marland Kitchen amphetamine-dextroamphetamine (ADDERALL) 5 MG tablet Take 1 tablet (5 mg total) by mouth daily.  . Ascorbic Acid (VITAMIN C) 1000 MG tablet Take 1,000 mg by mouth daily.  Marland Kitchen b complex vitamins tablet Take 1 tablet by mouth daily.  . Calcium-Magnesium-Vitamin D (CALCIUM 500 PO) Take by mouth.  . Cholecalciferol (VITAMIN D-3 PO) Take 1,000 mg by mouth daily.  Marland Kitchen glucagon (GLUCAGON EMERGENCY) 1 MG injection Inject 1 mg into the muscle once as needed.  Marland Kitchen glucose blood (BAYER CONTOUR NEXT TEST) test strip  Use 6x a day  . Insulin Infusion Pump Supplies (PARADIGM RESERVOIR 3ML) MISC 1 Device by Does not apply route daily.  . INSULIN SYRINGE 1CC/29G 29G X 1/2" 1 ML MISC 1 Syringe by Does not apply route every 3 (three) days.  . Lorcaserin HCl ER (BELVIQ XR) 20 MG TB24 Take 1 tablet by mouth daily.  . Melatonin 5 MG CHEW Chew 2 tablets by mouth at bedtime.  . Misc Natural Products (TURMERIC CURCUMIN) CAPS Take 1 capsule by mouth.  . Multiple Vitamins-Minerals (MULTIVITAMIN GUMMIES ADULT) CHEW Chew 1 Dose by mouth daily.  . naproxen (NAPROSYN) 500 MG tablet Take 1 tablet (500 mg total) by mouth 2 (two) times daily with a meal.  . NOVOLOG 100 UNIT/ML injection FOR USE IN INSULIN PUMP, TOTAL 250 UNITS PER DAY  . ONETOUCH DELICA LANCETS 99991111 MISC Use to test blood sugar 8 times daily as instructed.  . pantoprazole (PROTONIX) 40 MG tablet TAKE 1 TABLET BY MOUTH ONCE DAILY  . simvastatin (ZOCOR) 40 MG tablet Take 1 tablet (40 mg total) by mouth daily.  . Brompheniramine-Pseudoeph (BROMALINE PO) Take by mouth. Reported on 11/13/2015  . DHEA 25 MG CAPS Take 25 mg by mouth.  . losartan (COZAAR) 50 MG tablet Take 1 tablet (50 mg total) by mouth daily.  . metFORMIN (GLUCOPHAGE) 500 MG tablet Take 2 tablets (1,000 mg total) by mouth daily with supper. (Patient not taking: Reported on 04/22/2016)   No facility-administered encounter medications  on file as of 04/22/2016.     Functional Status:   In your present state of health, do you have any difficulty performing the following activities: 04/22/2016  Hearing? N  Vision? N  Difficulty concentrating or making decisions? N  Walking or climbing stairs? N  Dressing or bathing? N  Doing errands, shopping? 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  Plan:  [facility] CM Care Plan Problem One        Most Recent Value   Care Plan Problem One  Patient with longstanding Type 1.5 DM being managed as Type I, on insulin pump,  with current POC Hgb A1C improved at 7.3% previously 8.0% on 11/13/15, also with HTN and hyperlipidemia and meeting treatment targets as evidenced by BP readings consistently <130/<80, lipid profile on 02/20/16 showed overall elevation in all elements with triglycerides higher than target at 176 previously 96, with obesity with current BMI= 35.5 weight loss of 4 lbs in last 2 months   Role Documenting the Problem One  Care Management Coordinator   Care Plan for Problem One  Active   [facility] Long Term Goal (31-90 days)  Improved control of Type I DM as evidenced by improved Hgb A1C at the next assessment without increased episodes of hypoglycemia ( no more than 4 per week) and no severe hypoglycemia, ongoing good control of HTN and hyperlipidemia as evidenced by normal BP readings and normal lipid profile at each assessment , evidence of ongoing weight loss or no weight gain at next visit    [place] Goal Start Date  04/23/16   Interventions for Problem One Long Term Goal  Provided time for Zachary Graham to express the tremendous amount of stress he is under due to work and emotional support provided, encouraged him to use coping strategies that have worked for him in the past, assessed blood sugar readings, meal plan and exercise regimen, and current insulin pump settings, assessed POC Hgb A1C and CBG and discussed results including correlation between Hgb A1C and estimated average glucose, reviewed information  on the Medtronic Minimed 670G closed hybrid system and reminded Zachary Graham of the 100% coverage benefit under the [employer] To Wellness program pharmacy benefit, encouraged Zachary Graham to resume some form of exercise to help with his stress levels, glycemic control and  cardiovascular health,  reviewed upcoming appointments with various health care providers, will arrange for Link To Wellness follow up in 3 months     RNCM to fax today's office visit note to Dr. Ronnald Ramp and Dr. Cruzita Lederer. RNCM will meet quarterly and as needed with patient per Link To Wellness program guidelines to assist with Type II DM, HTN, hyperlipidemia and obesity self-management and assess patient's progress toward mutually set goals.  Barrington Ellison RN,CCM,CDE [place] Management Coordinator Link To Wellness Office Phone [phone] Office Fax [phone]

## 2016-04-29 MED FILL — NovoLOG 100 UNIT/ML SOLN: 100 | 24 days supply | Qty: 60 | Fill #2

## 2016-04-29 MED FILL — DEXTROAMP-AMPHET ER 20 MG C: 20 | 30 days supply | Qty: 30 | Fill #0

## 2016-05-20 MED FILL — PANTOPRAZOLE SOD DR 40 MG T: 40 | 90 days supply | Qty: 90 | Fill #1

## 2016-05-24 ENCOUNTER — Other Ambulatory Visit: Payer: Self-pay | Admitting: Internal Medicine

## 2016-05-24 DIAGNOSIS — F900 Attention-deficit hyperactivity disorder, predominantly inattentive type: Secondary | ICD-10-CM

## 2016-05-25 MED ORDER — AMPHETAMINE-DEXTROAMPHET ER 20 MG PO CP24: 20.0000 mg | ORAL_CAPSULE | Freq: Every day | ORAL | 0 refills | Status: DC

## 2016-05-25 MED ORDER — AMPHETAMINE-DEXTROAMPHETAMINE 5 MG PO TABS: 5.0000 mg | ORAL_TABLET | Freq: Every day | ORAL | 0 refills | Status: DC

## 2016-05-25 NOTE — Telephone Encounter (Signed)
rx placed up front for pt to pick up.

## 2016-05-27 ENCOUNTER — Ambulatory Visit (INDEPENDENT_AMBULATORY_CARE_PROVIDER_SITE_OTHER): Payer: 59 | Admitting: Internal Medicine

## 2016-05-27 ENCOUNTER — Encounter: Payer: Self-pay | Admitting: Internal Medicine

## 2016-05-27 VITALS — BP 134/84 | HR 88 | Ht 69.5 in | Wt 248.0 lb

## 2016-05-27 DIAGNOSIS — E1065 Type 1 diabetes mellitus with hyperglycemia: Secondary | ICD-10-CM | POA: Diagnosis not present

## 2016-05-27 MED FILL — BELVIQ XR 20 MG TABLET: 20 | 30 days supply | Qty: 30 | Fill #2

## 2016-05-27 MED FILL — NAPROXEN 500 MG TABLET: 500 | 15 days supply | Qty: 30 | Fill #2

## 2016-05-27 MED FILL — SIMVASTATIN 40 MG TABLET: 40 | 90 days supply | Qty: 90 | Fill #1

## 2016-05-27 NOTE — Progress Notes (Addendum)
Patient ID: Zachary Graham, male   DOB: 04-Mar-1961, 56 y.o.   MRN: [id number]  HPI: Zachary Graham is a 56 y.o.-year-old male, returning for f/u for DM1, dx 1993, insulin-dependent, uncontrolled, with complications (mild sensory peripheral neuropathy; hypoglycemia episodes). Last visit 3 mo ago.  He is even more busy at work. No time to exercise anymore.  His neuropathy is better after we restarted gabapentin at last visit and then taper it off slower and also added alpha lipoic acid.  Last hemoglobin A1c was: Lab Results  Component Value Date   HGBA1C 7.3 04/22/2016   HGBA1C 7.8 02/17/2016   HGBA1C 8.0 11/13/2015  04/05/2014: HbA1c 7.3%  Pt is on an insulin pump since 2006, currently on Paradigm 751.  His pump malfunctioned >> replaced 05/2015. He had an Enlite CGM (not using - feels readings were not accurate). Also on a Dexcom CGM >> stopped 3 weeks ago - Problems with the transmitter. Has Novolog in pump.   He tried Metformin in the past >> did not help In the past, but did not re-add it at last visit, as we discussed.  Now on: - Pump settings: - Basal rates: 12 AM to 2 AM: 2.5 2 AM: 2.6 5 AM: 2.9   7 AM: 3.0 8 AM: 2.6 12 PM: 1.9 4 PM: 2.1 6 PM: 2.2 10:30 PM: 2.3 - bolus: - insulin to carb ratio:  12 AM: 1.5 11 AM: 2.0 5 PM: 1.5  - ISF: 10 - target: 100-120 - IOB: 4h  TDD basal: 24% >> 29% TDD bolus: 76% >> 71% TDD 192 +/- 32 units  - Bolus wizard: On - Modified bolusing: no - Changes the pump site: every 2 days  Pt checks his sugars 3.3x a day-  (will scan the pump downloads) >> ave: 211 +/- 80: - am: 59-272 >> 61-196 >> 196-303 >> 95-272, >400 >> 81-295 >> 101-336 >> 168-287 - 8-9 am:  92-242 >> 101-282 >> 180-223 >> 98-153 >> n/c >> 113-264 >> 75, 234-269 - noon: 49x1, 105-219 >> 191-273 >> 110-161 >> 157-190 >> 89-228 >> 118-204 - before dinner: 141-247 >> 74-226 >> 127-146, 198 >> 133-242 >> 139-276, 381 - after dinner: 132-300 >> 70 (rarely)-337 >>  115-247, 317 >> 126-252 >> 71, 160-267, 355 - bedtime (12 am): 162-303 >> see above >> 62-181 >> 130-250 >> see above - 2 am: 189-278 >> 128-197, 337 >> 104-396 >> 144-296, 354 >> 117-310 - 4 am:79-234, 369 >> 147-233, 301 >> see above >> 123, 206 >> 189 He has a history of severe hypoglycemia episodes in 2015. He never had DKA.  - no CKD, last BUN/creatinine:  Lab Results  Component Value Date   BUN 13 03/01/2016   CREATININE 0.84 03/01/2016  he is on Cozaar. - last set of lipids: Lab Results  Component Value Date   CHOL 169 02/20/2016   HDL 40.80 02/20/2016   LDLCALC 93 02/20/2016   LDLDIRECT 132.1 05/26/2012   TRIG 176.0 (H) 02/20/2016   CHOLHDL 4 02/20/2016  he is on Zocor. - last eye exam was in 08/2015 -  No DR. - + numbness and tingling in his feet. This has improved on alpha lipoic acid. He also takes a B complex  He also has a history of GERD, hypertension, hyperlipidemia, OSA.   Lab Results  Component Value Date   TSH 3.01 03/01/2016   I reviewed pt's medications, allergies, PMH, social hx, family hx, and changes were documented in the history  of present illness. Otherwise, unchanged from my initial visit note.  ROS: Constitutional: no weight gain,no  fatigue, no subjective hyperthermia/hypothermia Eyes: no blurry vision, no xerophthalmia ENT: no sore throat, no nodules palpated in throat, no dysphagia/odynophagia, no hoarseness Cardiovascular: no CP/SOB/palpitations/no leg swelling Respiratory: no cough/SOB Gastrointestinal: no N/V/D/C Musculoskeletal: no muscle /joint aches Skin: no rashes Neurological: no tremors/+ numbness/+ tingling/no dizziness  PE: BP 134/84 (BP Location: Left Arm, Patient Position: Sitting)   Pulse 88   Ht 5' 9.5" (1.765 m)   Wt 248 lb (112.5 kg)   SpO2 97%   BMI 36.10 kg/m  Body mass index is 36.1 kg/m.  Wt Readings from Last 3 Encounters:  05/27/16 248 lb (112.5 kg)  04/22/16 246 lb 12.8 oz (111.9 kg)  03/01/16 250 lb 8 oz  (113.6 kg)   Constitutional: obese, in NAD Eyes: PERRLA, EOMI, no exophthalmos ENT: moist mucous membranes, no thyromegaly, no cervical lymphadenopathy Cardiovascular: RRR, No MRG Respiratory: CTA B Gastrointestinal: abdomen soft, NT, ND, BS+ Musculoskeletal: no deformities, strength intact in all 4 Skin: moist, warm, no rashes  ASSESSMENT: 1. DM1, insulin-dependent, uncontrolled, without complications - on pump - On CGM - Dexcom. He had an Enlite before, which found less accurate >> retried this Summer 2017 >> still poor accuracy.  - Barriers to good control:  Very busy schedule >> less time to check sugars and eat but But he does a great job with bolusing during the day >> still has a lot of large boluses during the day, sometimes without documented carbs or sugars  Lack of sleep >> only sleeps max 5h a night (!) and tries to catch up in the weekend  Reaches home late at night  - sometimes at 1 am >> eats >65% of the daily calories then  2. Numbness in feet  PLAN:  1. Patient with long-standing type 1 diabetes, on an insulin pump. He has Very high insulin resistance and requires a large amount of insulin per day. He is extremely busy at work, and does not have a lot of time to exercise. He still has high sugars over night as he gets home late at night and he eats repeatedly after he gets home. However, he now also has high sugars during the day. - At last visit, we discussed about adding back metformin, but we did not try this yet. I advised him to start. - He only gets <33% of daily insulin from basal rates, and >66% from boluses. We will gincrease his basal rates further.  - At last visit, we have discussed about the new Medtronic pump and I gave him a brochure about it with the name of the Medtronic rep >> he called and apparently he may qualify for this towards the end of this year - Reviewed together his last HbA1c, which was better, at 7.3%, however, at that time, he was using  the CGM consistently. 3 weeks ago he came off because of error in transmission. I advised him to call the Dexcom rep and have it replaced. - I advised him to:  Patient Instructions  Please change: - Basal rates: 12 AM: 2.5 >> 3.0 2 AM: 2.6 >> 3.0 5 AM: 2.9  >> 3.0 7 AM: 3.0 8 AM: 2.6 >> 2.8 12 PM: 1.9 >> 2.0 4 PM: 2.1 >> 2.6 6 PM: 2.2 >> 2.6 10:30 PM: 2.3 >> 2.5 - bolus: - insulin to carb ratio:  12 AM: 1.5 11 AM: 2.0 5 PM: 1.5  - ISF:  10 - target: 100-120 - IOB: 4h  Please start:  - Metformin 500 mg daily at dinnertime for 3 days >> then increase to 1000 mg at dinnertime.  Please come back for a follow-up appointment in 3 months  Please call Annye English: [phone]  - he is up-to-date with the flu shot and his eye exam - Return in about 3 months (around 08/25/2016).   2. Numbness in feet - He took 3 months of gabapentin by mistake. When he tried to stop the drug, she started to develop numbness in his feet. At last visit, we restarted gabapentin and tapered it or slowly and also  I advised him to start a B complex and alpha lipoic acid but also to restart the gabapentin and to taper it more slowly. His neuropathy is much better. We'll continue the B complex and the alpha lipoic acid.  - time spent with the patient: 40 min, of which >50% was spent in reviewing his pump download, discussing his hypo- and hyper-glycemic episodes, reviewing previous labs and pump settings and developing a plan to avoid hypo- an hyper-glycemia.   Philemon Kingdom, MD PhD [facility] Endocrinology

## 2016-05-27 NOTE — Patient Instructions (Addendum)
Please change: - Basal rates: 12 AM: 2.5 >> 3.0 2 AM: 2.6 >> 3.0 5 AM: 2.9  >> 3.0 7 AM: 3.0 8 AM: 2.6 >> 2.8 12 PM: 1.9 >> 2.0 4 PM: 2.1 >> 2.6 6 PM: 2.2 >> 2.6 10:30 PM: 2.3 >> 2.5 - bolus: - insulin to carb ratio:  12 AM: 1.5 11 AM: 2.0 5 PM: 1.5  - ISF: 10 - target: 100-120 - IOB: 4h  Please start:  - Metformin 500 mg daily at dinnertime for 3 days >> then increase to 1000 mg at dinnertime.  Please come back for a follow-up appointment in 3 months  Please call Annye English: [phone]

## 2016-06-04 MED FILL — DEXTROAMP-AMPHETAMINE 5 MG: 5 | 30 days supply | Qty: 30 | Fill #0

## 2016-06-04 MED FILL — ADDERALL XR 20 MG CAP SA: 20 | 30 days supply | Qty: 30 | Fill #0

## 2016-06-16 ENCOUNTER — Other Ambulatory Visit: Payer: Self-pay

## 2016-06-16 MED ORDER — FREESTYLE LITE DEVI: 0 refills | Status: DC

## 2016-06-16 MED ORDER — GLUCOSE BLOOD VI STRP: ORAL_STRIP | 5 refills | Status: DC

## 2016-06-16 MED ORDER — FREESTYLE LANCETS MISC: 5 refills | Status: DC

## 2016-06-16 MED FILL — CONTOUR NEXT STRIPS: 90 days supply | Qty: 600 | Fill #0

## 2016-06-17 ENCOUNTER — Encounter: Payer: Self-pay | Admitting: Internal Medicine

## 2016-06-20 ENCOUNTER — Other Ambulatory Visit: Payer: Self-pay | Admitting: Internal Medicine

## 2016-06-20 DIAGNOSIS — F900 Attention-deficit hyperactivity disorder, predominantly inattentive type: Secondary | ICD-10-CM

## 2016-06-20 MED ORDER — AMPHETAMINE-DEXTROAMPHETAMINE 10 MG PO TABS: 10.0000 mg | ORAL_TABLET | Freq: Two times a day (BID) | ORAL | 0 refills | Status: DC

## 2016-06-21 NOTE — Telephone Encounter (Signed)
Place up front.

## 2016-07-15 MED FILL — NovoLOG 100 UNIT/ML SOLN: 100 | 24 days supply | Qty: 60 | Fill #3

## 2016-07-20 MED FILL — DEXTROAMP-AMPHETAMIN 10 MG: 10 | 30 days supply | Qty: 60 | Fill #0

## 2016-08-23 MED FILL — PANTOPRAZOLE SOD DR 40 MG T: 40 | 90 days supply | Qty: 90 | Fill #2

## 2016-08-26 ENCOUNTER — Ambulatory Visit: Payer: 59 | Admitting: Internal Medicine

## 2016-09-16 ENCOUNTER — Other Ambulatory Visit: Payer: Self-pay | Admitting: Internal Medicine

## 2016-09-16 DIAGNOSIS — J301 Allergic rhinitis due to pollen: Secondary | ICD-10-CM

## 2016-09-16 DIAGNOSIS — E139 Other specified diabetes mellitus without complications: Secondary | ICD-10-CM

## 2016-09-16 MED FILL — CONTOUR NEXT STRIPS: 90 days supply | Qty: 600 | Fill #1

## 2016-09-22 ENCOUNTER — Telehealth: Payer: Self-pay

## 2016-09-22 NOTE — Telephone Encounter (Signed)
PA started   Key: [facility]

## 2016-09-24 DIAGNOSIS — Z01818 Encounter for other preprocedural examination: Secondary | ICD-10-CM | POA: Diagnosis not present

## 2016-09-24 MED FILL — QNASL 80 MCG NASAL SPRAY: 80 | 30 days supply | Qty: 9 | Fill #0

## 2016-09-24 NOTE — Telephone Encounter (Signed)
Will you inform patient the PA has been approved and to contact his pharmacy to fill the prescription.

## 2016-09-24 NOTE — Telephone Encounter (Signed)
Patient has been informed.

## 2016-10-07 ENCOUNTER — Ambulatory Visit: Payer: 59 | Admitting: Internal Medicine

## 2016-10-12 ENCOUNTER — Other Ambulatory Visit: Payer: Self-pay | Admitting: Internal Medicine

## 2016-10-12 ENCOUNTER — Encounter: Payer: Self-pay | Admitting: Internal Medicine

## 2016-10-12 ENCOUNTER — Telehealth: Payer: Self-pay | Admitting: Internal Medicine

## 2016-10-12 DIAGNOSIS — F900 Attention-deficit hyperactivity disorder, predominantly inattentive type: Secondary | ICD-10-CM

## 2016-10-12 DIAGNOSIS — E1065 Type 1 diabetes mellitus with hyperglycemia: Secondary | ICD-10-CM

## 2016-10-12 MED ORDER — AMPHETAMINE-DEXTROAMPHETAMINE 5 MG PO TABS: 5.0000 mg | ORAL_TABLET | Freq: Every day | ORAL | 0 refills | Status: DC

## 2016-10-12 MED ORDER — AMPHETAMINE-DEXTROAMPHETAMINE 10 MG PO TABS: 10.0000 mg | ORAL_TABLET | Freq: Two times a day (BID) | ORAL | 0 refills | Status: DC

## 2016-10-12 MED ORDER — FREESTYLE LITE DEVI: 1 refills | Status: DC

## 2016-10-12 MED FILL — DEXTROAMP-AMP 10 MG TAB: 10 | 30 days supply | Qty: 60 | Fill #0

## 2016-10-12 MED FILL — SIMVASTATIN 40 MG TABLET: 40 | 90 days supply | Qty: 90 | Fill #2

## 2016-10-12 MED FILL — DEXTROAMP-AMPHETAMINE 5 MG: 5 | 30 days supply | Qty: 30 | Fill #0

## 2016-10-12 NOTE — Telephone Encounter (Signed)
Patient is requesting to talk about getting the new libra glucose sensor.

## 2016-10-12 NOTE — Telephone Encounter (Signed)
Please advise 

## 2016-10-12 NOTE — Telephone Encounter (Signed)
Pt contacted and informed rx is ready for pick up.

## 2016-10-12 NOTE — Telephone Encounter (Signed)
Forwarded information

## 2016-10-13 ENCOUNTER — Telehealth: Payer: Self-pay

## 2016-10-13 NOTE — Telephone Encounter (Signed)
Called to ask if patient wanted this sent over. LVM with call back number.

## 2016-10-13 NOTE — Telephone Encounter (Signed)
Zachary Graham, can you please find out: does he want me to send this in?

## 2016-11-02 DIAGNOSIS — Z794 Long term (current) use of insulin: Secondary | ICD-10-CM | POA: Diagnosis not present

## 2016-11-02 DIAGNOSIS — E109 Type 1 diabetes mellitus without complications: Secondary | ICD-10-CM | POA: Diagnosis not present

## 2016-11-05 ENCOUNTER — Telehealth: Payer: Self-pay | Admitting: Internal Medicine

## 2016-11-05 MED FILL — LOSARTAN POTASSIUM 50 MG TA: 50 | 90 days supply | Qty: 90 | Fill #0

## 2016-11-05 NOTE — Telephone Encounter (Signed)
Reference Key :[id number]  Calling to inquire about PA for novolog vials.  Thank you,  -LL

## 2016-11-08 ENCOUNTER — Telehealth: Payer: Self-pay

## 2016-11-08 NOTE — Telephone Encounter (Signed)
Called patient, LVM, advised that I was submitting the paperwork for his PA for novolog.

## 2016-11-11 ENCOUNTER — Other Ambulatory Visit: Payer: Self-pay

## 2016-11-11 MED ORDER — INSULIN ASPART 100 UNIT/ML ~~LOC~~ SOLN: SUBCUTANEOUS | 11 refills | Status: DC

## 2016-11-11 MED FILL — NovoLOG 100 UNIT/ML SOLN: 100 | 24 days supply | Qty: 60 | Fill #0

## 2016-11-22 ENCOUNTER — Encounter: Payer: Self-pay | Admitting: *Deleted

## 2016-11-22 ENCOUNTER — Other Ambulatory Visit: Payer: Self-pay | Admitting: *Deleted

## 2016-11-22 NOTE — Patient Outreach (Signed)
[facility]) Care Management   11/22/2016  Zachary Graham 03/14/1961 [id number]  Zachary Graham is an 56 y.o. male who presents to the [place] Management office for routine Link To Wellness follow up for self management assistance with Type II DM, HTN, hyperlipidemia and obesity.  Subjective:  Zachary Graham says he remains very busy at work and is not longer exercising because he doesn't have the time. He says the stress at work has decreased somewhat due to personnel changes.  He says he has also had many things going on outside of work: his wife and daughter moved here from [place] after they sold their house there and bought a house in [place], he had to place his elderly father in a nursing home due to self care issues, and his son was married in [place].  He says he was recently seen at the [place] and an Hgb A1C was checked and it was 9.0%. He says his endocrinologist at the  [facility] advised him to have weight loss surgery but Zachary Graham feels he is too high risk for the surgery. He says he continues to wear the Medtronic pump (Paradigm 751) but is not wearing a contiguous glucose monitor because of accuracy and transmitting problems with both the Enlite and Dexcom products.   Objective:   Review of Systems  Constitutional: Negative.     Physical Exam  Constitutional: He is oriented to person, place, and time. He appears well-developed and well-nourished.  Respiratory: Effort normal.  Neurological: He is alert and oriented to person, place, and time.  Skin: Skin is warm and dry.  Psychiatric: He has a normal mood and affect. His behavior is normal. Judgment and thought content normal.     Today's Vitals   11/22/16 1358 11/22/16 1400  BP: 110/78   Weight: 240 lb (108.9 kg)   Height: 1.778 m (5\' 10" )   PainSc:  0-No pain    Encounter Medications:   Outpatient Encounter Prescriptions as of 11/22/2016  Medication Sig Note  . Alpha-Lipoic Acid 600 MG  CAPS Take by mouth. 11/22/2016: Takes for tingling for feet  . Ascorbic Acid (VITAMIN C) 1000 MG tablet Take 1,000 mg by mouth daily.   Marland Kitchen b complex vitamins tablet Take 1 tablet by mouth daily.   . Calcium-Magnesium-Vitamin D (CALCIUM 500 PO) Take by mouth.   . Cholecalciferol (VITAMIN D-3 PO) Take 1,000 mg by mouth daily.   . insulin aspart (NOVOLOG) 100 UNIT/ML injection FOR USE IN INSULIN PUMP, TOTAL 250 UNITS PER DAY   . Insulin Infusion Pump Supplies (PARADIGM RESERVOIR 3ML) MISC 1 Device by Does not apply route daily.   Marland Kitchen losartan (COZAAR) 50 MG tablet TAKE 1 TABLET BY MOUTH ONCE DAILY   . Melatonin 5 MG CHEW Chew 2 tablets by mouth at bedtime.   . Misc Natural Products (TURMERIC CURCUMIN) CAPS Take 1 capsule by mouth.   . naproxen (NAPROSYN) 500 MG tablet Take 1 tablet (500 mg total) by mouth 2 (two) times daily with a meal.   . pantoprazole (PROTONIX) 40 MG tablet TAKE 1 TABLET BY MOUTH ONCE DAILY   . QNASL 80 MCG/ACT AERS PLACE 4 PUFFS INTO THE NOSE DAILY.   . simvastatin (ZOCOR) 40 MG tablet Take 1 tablet (40 mg total) by mouth daily.   Marland Kitchen amphetamine-dextroamphetamine (ADDERALL) 10 MG tablet Take 1 tablet (10 mg total) by mouth 2 (two) times daily. (Patient not taking: Reported on 11/22/2016)   . amphetamine-dextroamphetamine (ADDERALL)  5 MG tablet Take 1 tablet (5 mg total) by mouth daily. (Patient not taking: Reported on 11/22/2016)   . Blood Glucose Monitoring Suppl (FREESTYLE LITE) DEVI Use to check sugar 6 times daily (Patient not taking: Reported on 11/22/2016)   . Brompheniramine-Pseudoeph (BROMALINE PO) Take by mouth. Reported on 11/13/2015   . DHEA 25 MG CAPS Take 25 mg by mouth.   Marland Kitchen glucagon (GLUCAGON EMERGENCY) 1 MG injection Inject 1 mg into the muscle once as needed.   Marland Kitchen glucose blood (FREESTYLE LITE) test strip Use as instructed to check sugar 6 times daily (Patient not taking: Reported on 11/22/2016)   . INSULIN SYRINGE 1CC/29G 29G X 1/2" 1 ML MISC 1 Syringe by Does not apply route  every 3 (three) days.   . Lancets (FREESTYLE) lancets Use as instructed to check sugar 6 times daily.   . Lorcaserin HCl ER (BELVIQ XR) 20 MG TB24 Take 1 tablet by mouth daily. (Patient not taking: Reported on 11/22/2016)   . metFORMIN (GLUCOPHAGE) 500 MG tablet Take 2 tablets (1,000 mg total) by mouth daily with supper. (Patient not taking: Reported on 05/27/2016)    No facility-administered encounter medications on file as of 11/22/2016.     Functional Status:   In your present state of health, do you have any difficulty performing the following activities: 04/22/2016  Hearing? N  Vision? N  Difficulty concentrating or making decisions? N  Walking or climbing stairs? N  Dressing or bathing? N  Doing errands, shopping? N  Some recent data might be hidden    Fall/Depression Screening:    PHQ 2/9 Scores 01/30/2015 01/16/2015 12/20/2014 06/26/2014 07/25/2013 06/26/2012  PHQ - 2 Score 0 0 0 0 0 0    Assessment:   [employer] employee and Link To Wellness member with Type I DM, HTN, hyperlipidemia, and obesity, on insulin pump therapy with continuous glucose monitor.  Plan:  [facility] CM Care Plan Problem One        Most Recent Value   Care Plan Problem One  Patient with longstanding Type 1.5 DM being managed as Type I, on insulin pump,  with most recent  Hgb A1C increased at 9.0% previously 7.3%, also with HTN and hyperlipidemia and meeting treatment targets as evidenced by BP readings consistently <130/<80, lipid profile on 02/20/16 showed overall elevation in all elements with triglycerides higher than target at 176 previously 96, with obesity with current BMI= 34.5 with ongoing slow steady weight loss    Role Documenting the Problem One  Care Management [place] for Problem One  Active   [facility] Long Term Goal (31-90 days)  Improved control of Type I DM as evidenced by improved Hgb A1C at the next assessment without increased episodes of hypoglycemia ( no more than 4 per week) and no severe  hypoglycemia, ongoing good control of HTN and hyperlipidemia as evidenced by normal BP readings and normal lipid profile at each assessment , evidence of ongoing weight loss or no weight gain at next visit    [place] Term Goal Start Date  11/22/16   Interventions for Problem One Long Term Goal  Assessed blood sugar readings, meal plan and exercise regimen, and current insulin pump settings, reviewed information  On the Freestyle Libre continuous glucose monitor and advised Zachary Graham that he can now get the sensors from a [facility] outpatient pharmacy, also reviewed  the Medtronic Minimed 670G closed hybrid system and reminded Zachary Graham of the 100% coverage benefit under the [employer]  To Wellness program pharmacy benefit, encouraged Noa to resume some form of exercise to help with his stress levels, glycemic control and cardiovascular health,  reviewed upcoming appointments with his primary care provider on 7/19 and his endocrinologist on 9/10 , will arrange for Link To Wellness follow up in 3 months     RNCM to fax today's office visit note to Dr. Ronnald Ramp and Dr. Cruzita Lederer. RNCM will meet quarterly and as needed with patient per Link To Wellness program guidelines to assist with Type II DM, HTN, hyperlipidemia and obesity self-management and assess patient's progress toward mutually set goals.  Barrington Ellison RN,CCM,CDE [place] Management Coordinator Link To Wellness Office Phone [phone] Office Fax [phone]

## 2016-11-24 DIAGNOSIS — G4733 Obstructive sleep apnea (adult) (pediatric): Secondary | ICD-10-CM | POA: Diagnosis not present

## 2016-12-02 ENCOUNTER — Ambulatory Visit (INDEPENDENT_AMBULATORY_CARE_PROVIDER_SITE_OTHER): Payer: 59 | Admitting: Internal Medicine

## 2016-12-02 ENCOUNTER — Encounter: Payer: Self-pay | Admitting: Internal Medicine

## 2016-12-02 ENCOUNTER — Other Ambulatory Visit (INDEPENDENT_AMBULATORY_CARE_PROVIDER_SITE_OTHER): Payer: 59

## 2016-12-02 VITALS — BP 110/68 | HR 80 | Temp 97.8°F | Ht 70.0 in | Wt 242.0 lb

## 2016-12-02 DIAGNOSIS — Z0000 Encounter for general adult medical examination without abnormal findings: Secondary | ICD-10-CM

## 2016-12-02 DIAGNOSIS — E785 Hyperlipidemia, unspecified: Secondary | ICD-10-CM | POA: Diagnosis not present

## 2016-12-02 DIAGNOSIS — R0789 Other chest pain: Secondary | ICD-10-CM | POA: Insufficient documentation

## 2016-12-02 DIAGNOSIS — F900 Attention-deficit hyperactivity disorder, predominantly inattentive type: Secondary | ICD-10-CM

## 2016-12-02 DIAGNOSIS — E781 Pure hyperglyceridemia: Secondary | ICD-10-CM | POA: Diagnosis not present

## 2016-12-02 DIAGNOSIS — F320 Major depressive disorder, single episode, mild: Secondary | ICD-10-CM | POA: Insufficient documentation

## 2016-12-02 DIAGNOSIS — E1065 Type 1 diabetes mellitus with hyperglycemia: Secondary | ICD-10-CM | POA: Diagnosis not present

## 2016-12-02 LAB — CBC WITH DIFFERENTIAL/PLATELET
BASOS ABS: 0.1 10*3/uL (ref 0.0–0.1)
Basophils Relative: 0.6 % (ref 0.0–3.0)
EOS ABS: 0.4 10*3/uL (ref 0.0–0.7)
Eosinophils Relative: 4.2 % (ref 0.0–5.0)
HEMATOCRIT: 41.1 % (ref 39.0–52.0)
HEMOGLOBIN: 13.6 g/dL (ref 13.0–17.0)
LYMPHS PCT: 23.6 % (ref 12.0–46.0)
Lymphs Abs: 2 10*3/uL (ref 0.7–4.0)
MCHC: 33.1 g/dL (ref 30.0–36.0)
MCV: 83.7 fl (ref 78.0–100.0)
Monocytes Absolute: 0.7 10*3/uL (ref 0.1–1.0)
Monocytes Relative: 8.5 % (ref 3.0–12.0)
Neutro Abs: 5.4 10*3/uL (ref 1.4–7.7)
Neutrophils Relative %: 63.1 % (ref 43.0–77.0)
PLATELETS: 231 10*3/uL (ref 150.0–400.0)
RBC: 4.9 Mil/uL (ref 4.22–5.81)
RDW: 14.1 % (ref 11.5–15.5)
WBC: 8.5 10*3/uL (ref 4.0–10.5)

## 2016-12-02 LAB — COMPREHENSIVE METABOLIC PANEL
ALBUMIN: 3.7 g/dL (ref 3.5–5.2)
ALK PHOS: 89 U/L (ref 39–117)
ALT: 23 U/L (ref 0–53)
AST: 14 U/L (ref 0–37)
BILIRUBIN TOTAL: 0.5 mg/dL (ref 0.2–1.2)
BUN: 14 mg/dL (ref 6–23)
CALCIUM: 8.9 mg/dL (ref 8.4–10.5)
CO2: 26 mEq/L (ref 19–32)
CREATININE: 0.85 mg/dL (ref 0.40–1.50)
Chloride: 103 mEq/L (ref 96–112)
GFR: 99.96 mL/min (ref >60.00)
Glucose, Bld: 235 mg/dL — ABNORMAL HIGH (ref 70–99)
Potassium: 3.9 mEq/L (ref 3.5–5.1)
Sodium: 136 mEq/L (ref 135–145)
Total Protein: 6.3 g/dL (ref 6.0–8.3)

## 2016-12-02 LAB — LIPID PANEL
CHOLESTEROL: 152 mg/dL (ref 0–200)
HDL: 40 mg/dL (ref >39.00)
NonHDL: 111.66
Total CHOL/HDL Ratio: 4
Triglycerides: 290 mg/dL — ABNORMAL HIGH (ref 0.0–149.0)
VLDL: 58 mg/dL — ABNORMAL HIGH (ref 0.0–40.0)

## 2016-12-02 LAB — LDL CHOLESTEROL, DIRECT: LDL DIRECT: 87 mg/dL

## 2016-12-02 LAB — TROPONIN I: TNIDX: 0 ug/l (ref 0.00–0.06)

## 2016-12-02 LAB — HEMOGLOBIN A1C: Hgb A1c MFr Bld: 9.7 % — ABNORMAL HIGH (ref 4.6–6.5)

## 2016-12-02 MED ORDER — DULOXETINE HCL 30 MG PO CPEP: 30.0000 mg | ORAL_CAPSULE | Freq: Every day | ORAL | 2 refills | Status: DC

## 2016-12-02 MED ORDER — METFORMIN HCL 500 MG PO TABS
1000.0000 mg | ORAL_TABLET | Freq: Every day | ORAL | 1 refills | Status: DC
Start: 2016-12-02 — End: 2018-06-03

## 2016-12-02 MED ORDER — AMPHETAMINE-DEXTROAMPHETAMINE 10 MG PO TABS: 10.0000 mg | ORAL_TABLET | Freq: Two times a day (BID) | ORAL | 0 refills | Status: DC

## 2016-12-02 MED FILL — DULoxetine HCL 30 MG CPEP: 30 | 30 days supply | Qty: 30 | Fill #0

## 2016-12-02 MED FILL — metFORMIN HCL 500 MG TABS: 500 | 90 days supply | Qty: 180 | Fill #0

## 2016-12-02 MED FILL — DEXTROAMP-AMP 10 MG TAB: 10 | 30 days supply | Qty: 60 | Fill #0

## 2016-12-02 NOTE — Progress Notes (Signed)
Pre visit review using our clinic review tool, if applicable. No additional management support is needed unless otherwise documented below in the visit note. 

## 2016-12-02 NOTE — Progress Notes (Signed)
Subjective:  Patient ID: Zachary Graham, male    DOB: Oct 02, 1960  Age: 56 y.o. 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  He complains that his blood sugars have been high. He has not been taking metformin. He has not been able to practice a healthy lifestyle recently because of long work hours and moving his home. He complains of fatigue, irritability, sadness, and anhedonia. He was treated for depression nearly 20 years ago. He doesn't know what he took or what his response was to it. He has not been taking Adderall and complains that he has a decrease in focus, performance, and concentration. About one month ago he noticed that he started developing tightness in his chest that only occurs at rest. He has had no exertional symptoms and he denies DOE, diaphoresis, cough, edema, or palpitations.  Outpatient Medications Prior to Visit  Medication Sig Dispense Refill  . Alpha-Lipoic Acid 600 MG CAPS Take by mouth.    . Ascorbic Acid (VITAMIN C) 1000 MG tablet Take 1,000 mg by mouth daily.    Marland Kitchen b complex vitamins tablet Take 1 tablet by mouth daily.    . Blood Glucose Monitoring Suppl (FREESTYLE LITE) DEVI Use to check sugar 6 times daily 1 each 1  . Calcium-Magnesium-Vitamin D (CALCIUM 500 PO) Take by mouth.    . Cholecalciferol (VITAMIN D-3 PO) Take 1,000 mg by mouth daily.    Marland Kitchen glucagon (GLUCAGON EMERGENCY) 1 MG injection Inject 1 mg into the muscle once as needed. 2 each prn  . glucose blood (FREESTYLE LITE) test strip Use as instructed to check sugar 6 times daily 400 each 5  . insulin aspart (NOVOLOG) 100 UNIT/ML injection FOR USE IN INSULIN PUMP, TOTAL 250 UNITS PER DAY 300 mL 11  . Insulin Infusion Pump Supplies (PARADIGM RESERVOIR 3ML) MISC 1 Device by Does not apply route daily.    . INSULIN SYRINGE 1CC/29G 29G X 1/2" 1 ML MISC 1 Syringe by Does not apply route every 3 (three) days. 10 each 3  . Lancets (FREESTYLE) lancets Use as  instructed to check sugar 6 times daily. 400 each 5  . losartan (COZAAR) 50 MG tablet TAKE 1 TABLET BY MOUTH ONCE DAILY 90 tablet 1  . Melatonin 5 MG CHEW Chew 2 tablets by mouth at bedtime.    . Misc Natural Products (TURMERIC CURCUMIN) CAPS Take 1 capsule by mouth.    . naproxen (NAPROSYN) 500 MG tablet Take 1 tablet (500 mg total) by mouth 2 (two) times daily with a meal. 30 tablet 3  . pantoprazole (PROTONIX) 40 MG tablet TAKE 1 TABLET BY MOUTH ONCE DAILY 90 tablet 3  . QNASL 80 MCG/ACT AERS PLACE 4 PUFFS INTO THE NOSE DAILY. 8.7 g 5  . simvastatin (ZOCOR) 40 MG tablet Take 1 tablet (40 mg total) by mouth daily. 90 tablet 3  . amphetamine-dextroamphetamine (ADDERALL) 5 MG tablet Take 1 tablet (5 mg total) by mouth daily. (Patient not taking: Reported on 11/22/2016) 30 tablet 0  . DHEA 25 MG CAPS Take 25 mg by mouth.    Marland Kitchen amphetamine-dextroamphetamine (ADDERALL) 10 MG tablet Take 1 tablet (10 mg total) by mouth 2 (two) times daily. (Patient not taking: Reported on 11/22/2016) 60 tablet 0  . Brompheniramine-Pseudoeph (BROMALINE PO) Take by mouth. Reported on 11/13/2015    . Lorcaserin HCl ER (BELVIQ XR) 20 MG TB24 Take 1 tablet by mouth daily. (Patient not taking: Reported on  11/22/2016) 30 tablet 5  . metFORMIN (GLUCOPHAGE) 500 MG tablet Take 2 tablets (1,000 mg total) by mouth daily with supper. (Patient not taking: Reported on 05/27/2016) 180 tablet 3   No facility-administered medications prior to visit.     ROS Review of Systems  Constitutional: Positive for fatigue and unexpected weight change (wt gain). Negative for appetite change, chills, diaphoresis and fever.  HENT: Negative.   Eyes: Negative for visual disturbance.  Respiratory: Positive for apnea and chest tightness. Negative for cough, choking, shortness of breath, wheezing and stridor.   Cardiovascular: Negative for chest pain, palpitations and leg swelling.  Gastrointestinal: Negative for abdominal pain, constipation, diarrhea,  nausea and vomiting.  Endocrine: Positive for polydipsia. Negative for cold intolerance, heat intolerance, polyphagia and polyuria.  Genitourinary: Negative.  Negative for difficulty urinating, discharge, dysuria, penile swelling, scrotal swelling, testicular pain and urgency.  Musculoskeletal: Negative.  Negative for myalgias.  Skin: Negative.   Allergic/Immunologic: Negative.   Neurological: Negative.  Negative for dizziness, weakness and light-headedness.  Hematological: Negative for adenopathy. Does not bruise/bleed easily.  Psychiatric/Behavioral: Positive for decreased concentration, dysphoric mood and sleep disturbance. Negative for agitation, self-injury and suicidal ideas. The patient is not nervous/anxious and is not hyperactive.     Objective:  BP 110/68 (BP Location: Left Arm, Patient Position: Sitting, Cuff Size: Large)   Pulse 80   Temp 97.8 F (36.6 C) (Oral)   Ht 5\' 10"  (1.778 m)   Wt 242 lb (109.8 kg)   SpO2 99%   BMI 34.72 kg/m   BP Readings from Last 3 Encounters:  12/02/16 110/68  11/22/16 110/78  05/27/16 134/84    Wt Readings from Last 3 Encounters:  12/02/16 242 lb (109.8 kg)  11/22/16 240 lb (108.9 kg)  05/27/16 248 lb (112.5 kg)    Physical Exam  Constitutional: He is oriented to person, place, and time. No distress.  HENT:  Head: Normocephalic and atraumatic.  Mouth/Throat: Oropharynx is clear and moist. No oropharyngeal exudate.  Eyes: Conjunctivae are normal. Right eye exhibits no discharge. Left eye exhibits no discharge. No scleral icterus.  Neck: Normal range of motion. Neck supple. No JVD present. No thyromegaly present.  Cardiovascular: Normal rate, regular rhythm and intact distal pulses.  Exam reveals no gallop and no friction rub.   No murmur heard. EKG - Sinus  Rhythm  -First degree A-V block  - occasional ectopic ventricular beat    PRi = 226 BORDERLINE RHYTHM- no old EKG for comparison   Pulmonary/Chest: Effort normal and breath  sounds normal. No respiratory distress. He has no wheezes. He has no rales. He exhibits no tenderness.  Abdominal: Soft. Bowel sounds are normal. He exhibits no distension and no mass. There is no tenderness. There is no rebound and no guarding. Hernia confirmed negative in the right inguinal area and confirmed negative in the left inguinal area.  Genitourinary: Rectum normal, prostate normal, testes normal and penis normal. Rectal exam shows no external hemorrhoid, no internal hemorrhoid, no fissure, no mass, no tenderness, anal tone normal and guaiac negative stool. Prostate is not enlarged and not tender. Right testis shows no mass, no swelling and no tenderness. Right testis is descended. Left testis shows no mass, no swelling and no tenderness. Left testis is descended. Circumcised. No penile erythema or penile tenderness. No discharge found.  Musculoskeletal: Normal range of motion. He exhibits no edema, tenderness or deformity.  Lymphadenopathy:    He has no cervical adenopathy.       Right:  No inguinal adenopathy present.       Left: No inguinal adenopathy present.  Neurological: He is alert and oriented to person, place, and time.  Skin: Skin is warm and dry. No rash noted. He is not diaphoretic. No erythema. No pallor.  Psychiatric: Judgment normal. His mood appears not anxious. His affect is angry. His speech is not rapid and/or pressured, not delayed and not tangential. He is not agitated, not slowed and not withdrawn. Cognition and memory are normal. He exhibits a depressed mood. He expresses no homicidal and no suicidal ideation. He expresses no suicidal plans and no homicidal plans. He is attentive.  Vitals reviewed.   Lab Results  Component Value Date   WBC 8.5 12/02/2016   HGB 13.6 12/02/2016   HCT 41.1 12/02/2016   PLT 231.0 12/02/2016   GLUCOSE 235 (H) 12/02/2016   CHOL 152 12/02/2016   TRIG 290.0 (H) 12/02/2016   HDL 40.00 12/02/2016   LDLDIRECT 87.0 12/02/2016   LDLCALC  93 02/20/2016   ALT 23 12/02/2016   AST 14 12/02/2016   NA 136 12/02/2016   K 3.9 12/02/2016   CL 103 12/02/2016   CREATININE 0.85 12/02/2016   BUN 14 12/02/2016   CO2 26 12/02/2016   TSH 3.01 03/01/2016   PSA 0.41 03/01/2016   HGBA1C 9.7 (H) 12/02/2016   MICROALBUR <0.7 03/01/2016    No results found.  Assessment & Plan:   Aalijah was seen today for hypertension and depression.  Diagnoses and all orders for this visit:  Type 1 diabetes mellitus with hyperglycemia, with long-term current use of insulin ([place])- his A1c is up to 9.7%. Will restart metformin. I've also asked him to follow-up with his endocrinologist. -     Comprehensive metabolic panel; Future -     CBC with Differential/Platelet; Future -     Hemoglobin A1c; Future -     Ambulatory referral to Ophthalmology -     metFORMIN (GLUCOPHAGE) 500 MG tablet; Take 2 tablets (1,000 mg total) by mouth daily with supper. -     Ambulatory referral to Endocrinology  Pure hyperglyceridemia- some improvement noted. -     Lipid panel; Future  Hyperlipidemia with target LDL less than 100- he has achieved his LDL goal is doing well on the statin. -     Lipid panel; Future  Current mild episode of major depressive disorder without prior episode ([place])- we'll start Cymbalta and increase the dose over time. -     DULoxetine (CYMBALTA) 30 MG capsule; Take 1 capsule (30 mg total) by mouth daily.  ADHD (attention deficit hyperactivity disorder), inattentive type- I think his performance would improve if he restarted Adderall. -     amphetamine-dextroamphetamine (ADDERALL) 10 MG tablet; Take 1 tablet (10 mg total) by mouth 2 (two) times daily.  Chest tightness- his chest pain is suspicious for angina in that he describes it as a tightness but there is no exertional component so it is more likely to be atypical chest pain. His EKG is negative for ischemia but since he does have risk factors for CAD I've asked him to undergo a myocardial  perfusion imaging study. -     EKG 12-Lead -     Troponin I; Future -     Myocardial Perfusion Imaging; Future   I have discontinued Mr. Minturn Brompheniramine-Pseudoeph (BROMALINE PO) and Lorcaserin HCl ER. I am also having him start on DULoxetine. Additionally, I am having him maintain his PARADIGM RESERVOIR 3ML, INSULIN SYRINGE 1CC/29G,  glucagon, simvastatin, Melatonin, Turmeric Curcumin, Cholecalciferol (VITAMIN D-3 PO), Calcium-Magnesium-Vitamin D (CALCIUM 500 PO), DHEA, vitamin C, pantoprazole, naproxen, Alpha-Lipoic Acid, b complex vitamins, glucose blood, freestyle, losartan, QNASL, amphetamine-dextroamphetamine, FREESTYLE LITE, insulin aspart, metFORMIN, and amphetamine-dextroamphetamine.  Meds ordered this encounter  Medications  . DULoxetine (CYMBALTA) 30 MG capsule    Sig: Take 1 capsule (30 mg total) by mouth daily.    Dispense:  30 capsule    Refill:  2  . metFORMIN (GLUCOPHAGE) 500 MG tablet    Sig: Take 2 tablets (1,000 mg total) by mouth daily with supper.    Dispense:  180 tablet    Refill:  1  . amphetamine-dextroamphetamine (ADDERALL) 10 MG tablet    Sig: Take 1 tablet (10 mg total) by mouth 2 (two) times daily.    Dispense:  60 tablet    Refill:  0     Follow-up: Return in about 4 weeks (around 12/30/2016).  Scarlette Calico, MD

## 2016-12-02 NOTE — Patient Instructions (Signed)
Nonspecific Chest Pain °Chest pain can be caused by many different conditions. There is always a chance that your pain could be related to something serious, such as a heart attack or a blood clot in your lungs. Chest pain can also be caused by conditions that are not life-threatening. If you have chest pain, it is very important to follow up with your health care provider. °What are the causes? °Causes of this condition include: °· Heartburn. °· Pneumonia or bronchitis. °· Anxiety or stress. °· Inflammation around your heart (pericarditis) or lung (pleuritis or pleurisy). °· A blood clot in your lung. °· A collapsed lung (pneumothorax). This can develop suddenly on its own (spontaneous pneumothorax) or from trauma to the chest. °· Shingles infection (varicella-zoster virus). °· Heart attack. °· Damage to the bones, muscles, and cartilage that make up your chest wall. This can include: °? Bruised bones due to injury. °? Strained muscles or cartilage due to frequent or repeated coughing or overwork. °? Fracture to one or more ribs. °? Sore cartilage due to inflammation (costochondritis). ° °What increases the risk? °Risk factors for this condition may include: °· Activities that increase your risk for trauma or injury to your chest. °· Respiratory infections or conditions that cause frequent coughing. °· Medical conditions or overeating that can cause heartburn. °· Heart disease or family history of heart disease. °· Conditions or health behaviors that increase your risk of developing a blood clot. °· Having had chicken pox (varicella zoster). ° °What are the signs or symptoms? °Chest pain can feel like: °· Burning or tingling on the surface of your chest or deep in your chest. °· Crushing, pressure, aching, or squeezing pain. °· Dull or sharp pain that is worse when you move, cough, or take a deep breath. °· Pain that is also felt in your back, neck, shoulder, or arm, or pain that spreads to any of these  areas. ° °Your chest pain may come and go, or it may stay constant. °How is this diagnosed? °Lab tests or other studies may be needed to find the cause of your pain. Your health care provider may have you take a test called an ECG (electrocardiogram). An ECG records your heartbeat patterns at the time the test is performed. You may also have other tests, such as: °· Transthoracic echocardiogram (TTE). In this test, sound waves are used to create a picture of the heart structures and to look at how blood flows through your heart. °· Transesophageal echocardiogram (TEE). This is a more advanced imaging test that takes images from inside your body. It allows your health care provider to see your heart in finer detail. °· Cardiac monitoring. This allows your health care provider to monitor your heart rate and rhythm in real time. °· Holter monitor. This is a portable device that records your heartbeat and can help to diagnose abnormal heartbeats. It allows your health care provider to track your heart activity for several days, if needed. °· Stress tests. These can be done through exercise or by taking medicine that makes your heart beat more quickly. °· Blood tests. °· Other imaging tests. ° °How is this treated? °Treatment depends on what is causing your chest pain. Treatment may include: °· Medicines. These may include: °? Acid blockers for heartburn. °? Anti-inflammatory medicine. °? Pain medicine for inflammatory conditions. °? Antibiotic medicine, if an infection is present. °? Medicines to dissolve blood clots. °? Medicines to treat coronary artery disease (CAD). °· Supportive care for conditions that   do not require medicines. This may include: °? Resting. °? Applying heat or cold packs to injured areas. °? Limiting activities until pain decreases. ° °Follow these instructions at home: °Medicines °· If you were prescribed an antibiotic, take it as told by your health care provider. Do not stop taking the  antibiotic even if you start to feel better. °· Take over-the-counter and prescription medicines only as told by your health care provider. °Lifestyle °· Do not use any products that contain nicotine or tobacco, such as cigarettes and e-cigarettes. If you need help quitting, ask your health care provider. °· Do not drink alcohol. °· Make lifestyle changes as directed by your health care provider. These may include: °? Getting regular exercise. Ask your health care provider to suggest some activities that are safe for you. °? Eating a heart-healthy diet. A registered dietitian can help you to learn healthy eating options. °? Maintaining a healthy weight. °? Managing diabetes, if necessary. °? Reducing stress, such as with yoga or relaxation techniques. °General instructions °· Avoid any activities that bring on chest pain. °· If heartburn is the cause for your chest pain, raise (elevate) the head of your bed about 6 inches (15 cm) by putting blocks under the legs. Sleeping with more pillows does not effectively relieve heartburn because it only changes the position of your head. °· Keep all follow-up visits as told by your health care provider. This is important. This includes any further testing if your chest pain does not go away. °Contact a health care provider if: °· Your chest pain does not go away. °· You have a rash with blisters on your chest. °· You have a fever. °· You have chills. °Get help right away if: °· Your chest pain is worse. °· You have a cough that gets worse, or you cough up blood. °· You have severe pain in your abdomen. °· You have severe weakness. °· You faint. °· You have sudden, unexplained chest discomfort. °· You have sudden, unexplained discomfort in your arms, back, neck, or jaw. °· You have shortness of breath at any time. °· You suddenly start to sweat, or your skin gets clammy. °· You feel nauseous or you vomit. °· You suddenly feel light-headed or dizzy. °· Your heart begins to beat  quickly, or it feels like it is skipping beats. °These symptoms may represent a serious problem that is an emergency. Do not wait to see if the symptoms will go away. Get medical help right away. Call your local emergency services (911 in the [place] Do not drive yourself to the hospital. °This information is not intended to replace advice given to you by your health care provider. Make sure you discuss any questions you have with your health care provider. °Document Released: 02/10/2005 Document Revised: 01/26/2016 Document Reviewed: 01/26/2016 °Elsevier Interactive Patient Education © 2017 [employer] ° °

## 2016-12-04 NOTE — Assessment & Plan Note (Signed)
Exam completed Labs ordered and reviewed Vaccines reviewed PSA not repeated since it was done less than one year ago and his exam is normal Colon cancer screening is up-to-date Patient education material was given.

## 2016-12-08 ENCOUNTER — Telehealth (HOSPITAL_COMMUNITY): Payer: Self-pay | Admitting: Internal Medicine

## 2016-12-09 NOTE — Telephone Encounter (Signed)
User: Cherie Dark A Date/time: 12/08/16 9:13 AM  Comment: Called pt and lmsg for him to CB to get scheduled for myoview.  Context:  Outcome: Left Message  Phone number: [phone] Phone Type: Home Phone  Comm. type: Telephone Call type: Outgoing  Contact: Leavens, Elta [place] D Relation to patient: Self

## 2016-12-14 ENCOUNTER — Telehealth (HOSPITAL_COMMUNITY): Payer: Self-pay | Admitting: *Deleted

## 2016-12-14 MED FILL — NAPROXEN 500 MG TABLET: 500 | 15 days supply | Qty: 30 | Fill #3

## 2016-12-14 MED FILL — PANTOPRAZOLE SOD DR 40 MG T: 40 | 90 days supply | Qty: 90 | Fill #3

## 2016-12-14 NOTE — Telephone Encounter (Signed)
Patient given detailed instructions per Myocardial Perfusion Study Information Sheet for the test on 12/16/16. Patient notified to arrive 15 minutes early and that it is imperative to arrive on time for appointment to keep from having the test rescheduled.  If you need to cancel or reschedule your appointment, please call the office within 24 hours of your appointment. . Patient verbalized understanding. Kirstie Peri

## 2016-12-16 ENCOUNTER — Encounter: Payer: Self-pay | Admitting: Internal Medicine

## 2016-12-16 ENCOUNTER — Ambulatory Visit (HOSPITAL_COMMUNITY): Payer: 59 | Attending: Cardiology

## 2016-12-16 DIAGNOSIS — R0789 Other chest pain: Secondary | ICD-10-CM | POA: Diagnosis not present

## 2016-12-16 LAB — MYOCARDIAL PERFUSION IMAGING
CHL CUP NUCLEAR SSS: 6
CSEPEDS: 0 s
CSEPEW: 10.1 METS
Exercise duration (min): 9 min
LV sys vol: 66 mL
LVDIAVOL: 135 mL (ref 62–150)
MPHR: 167 {beats}/min
NUC STRESS TID: 1.16
Peak HR: 155 {beats}/min
Percent HR: 92 %
RATE: 0.33
Rest HR: 69 {beats}/min
SDS: 5
SRS: 1

## 2016-12-16 MED ORDER — TECHNETIUM TC 99M TETROFOSMIN IV KIT
10.1000 | PACK | Freq: Once | INTRAVENOUS | Status: AC | PRN
  Administered 2016-12-16: 10.1 via INTRAVENOUS
  Filled 2016-12-16: qty 11

## 2016-12-16 MED ORDER — TECHNETIUM TC 99M TETROFOSMIN IV KIT
32.8000 | PACK | Freq: Once | INTRAVENOUS | Status: AC | PRN
  Administered 2016-12-16: 32.8 via INTRAVENOUS
  Filled 2016-12-16: qty 33

## 2016-12-30 ENCOUNTER — Telehealth: Payer: 59 | Admitting: Family

## 2016-12-30 DIAGNOSIS — M546 Pain in thoracic spine: Secondary | ICD-10-CM

## 2016-12-30 MED ORDER — BACLOFEN 10 MG PO TABS: 10.0000 mg | ORAL_TABLET | Freq: Three times a day (TID) | ORAL | 0 refills | Status: DC | PRN

## 2016-12-30 MED ORDER — ETODOLAC 300 MG PO CAPS: 300.0000 mg | ORAL_CAPSULE | Freq: Two times a day (BID) | ORAL | 0 refills | Status: DC

## 2016-12-30 MED FILL — NovoLOG 100 UNIT/ML SOLN: 100 | 24 days supply | Qty: 60 | Fill #1

## 2016-12-30 MED FILL — DULoxetine HCL 30 MG CPEP: 30 | 30 days supply | Qty: 30 | Fill #1

## 2016-12-30 MED FILL — ETODOLAC 300 MG CAPSULE: 300 | 10 days supply | Qty: 20 | Fill #0

## 2016-12-30 MED FILL — BACLOFEN 10 MG TABLET: 10 | 10 days supply | Qty: 30 | Fill #0

## 2016-12-30 NOTE — Progress Notes (Signed)
Thank you for the details you put in the comment boxes. Those details really help [place] take better care of you. Sometimes, with muscle spasms or strains in the back/neck, they will put pressure on nerves causing referred pain like you are experiencing in your shoulder. See the template we use below along with the scripts I am sending. Also, you can do light massage on the area (left/right sides of neck) and when the muscles loosen up from the medication and massage and ice, the pressure on the nerves will become less and the shoulder pain should clear up. These things are usually very temporary, but if long-term, could indicate serious problems. Most of the time, basic treatment (and time) clears these situations up. Just be careful not to lift weights or any pulling/lifting more than a few pounds (perhaps less than 10) while this is bothering you. The Baclofen below says every 8 hours as needed but I would really make sure you take it at least twice daily for now to allow that muscle to relax more.   We are sorry that you are not feeling well.  Here is how we plan to help!  Based on what you have shared with me it looks like you mostly have acute back pain.  Acute back pain is defined as musculoskeletal pain that can resolve in 1-3 weeks with conservative treatment.  I have prescribed Etodolac 300 mg twice a day non-steroid anti-inflammatory (NSAID) as well as Baclofen 10 mg every eight hours as needed which is a muscle relaxer  Some patients experience stomach irritation or in increased heartburn with anti-inflammatory drugs.  Please keep in mind that muscle relaxer's can cause fatigue and should not be taken while at work or driving.  Back pain is very common.  The pain often gets better over time.  The cause of back pain is usually not dangerous.  Most people can learn to manage their back pain on their own.  Home Care  Stay active.  Start with short walks on flat ground if you can.  Try to walk  farther each day.  Do not sit, drive or stand in one place for more than 30 minutes.  Do not stay in bed.  Do not avoid exercise or work.  Activity can help your back heal faster.  Be careful when you bend or lift an object.  Bend at your knees, keep the object close to you, and do not twist.  Sleep on a firm mattress.  Lie on your side, and bend your knees.  If you lie on your back, put a pillow under your knees.  Only take medicines as told by your doctor.  Put ice on the injured area.  Put ice in a plastic bag  Place a towel between your skin and the bag  Leave the ice on for 15-20 minutes, 3-4 times a day for the first 2-3 days. 210 After that, you can switch between ice and heat packs.  Ask your doctor about back exercises or massage.  Avoid feeling anxious or stressed.  Find good ways to deal with stress, such as exercise.  Get Help Right Way If:  Your pain does not go away with rest or medicine.  Your pain does not go away in 1 week.  You have new problems.  You do not feel well.  The pain spreads into your legs.  You cannot control when you poop (bowel movement) or pee (urinate)  You feel sick to your stomach (  nauseous) or throw up (vomit)  You have belly (abdominal) pain.  You feel like you may pass out (faint).  If you develop a fever.  Make Sure you:  Understand these instructions.  Will watch your condition  Will get help right away if you are not doing well or get worse.  Your e-visit answers were reviewed by a board certified advanced clinical practitioner to complete your personal care plan.  Depending on the condition, your plan could have included both over the counter or prescription medications.  If there is a problem please reply  once you have received a response from your provider.  Your safety is important to [place].  If you have drug allergies check your prescription carefully.    You can use MyChart to ask questions about today's visit,  request a non-urgent call back, or ask for a work or school excuse for 24 hours related to this e-Visit. If it has been greater than 24 hours you will need to follow up with your provider, or enter a new e-Visit to address those concerns.  You will get an e-mail in the next two days asking about your experience.  I hope that your e-visit has been valuable and will speed your recovery. Thank you for using e-visits.

## 2016-12-31 ENCOUNTER — Encounter (HOSPITAL_COMMUNITY): Payer: Self-pay | Admitting: Emergency Medicine

## 2016-12-31 ENCOUNTER — Ambulatory Visit (HOSPITAL_COMMUNITY)
Admission: EM | Admit: 2016-12-31 | Discharge: 2016-12-31 | Disposition: A | Discharge status: Home / Self Care | Payer: 59 | Attending: Family Medicine | Admitting: Family Medicine

## 2016-12-31 DIAGNOSIS — M25511 Pain in right shoulder: Secondary | ICD-10-CM | POA: Diagnosis not present

## 2016-12-31 DIAGNOSIS — E1065 Type 1 diabetes mellitus with hyperglycemia: Secondary | ICD-10-CM

## 2016-12-31 MED ORDER — KETOROLAC TROMETHAMINE 30 MG/ML IJ SOLN
INTRAMUSCULAR | Status: AC
  Filled 2016-12-31: qty 1

## 2016-12-31 MED ORDER — KETOROLAC TROMETHAMINE 30 MG/ML IJ SOLN
30.0000 mg | Freq: Once | INTRAMUSCULAR | Status: AC
  Administered 2016-12-31: 30 mg via INTRAMUSCULAR

## 2016-12-31 MED ORDER — DICLOFENAC SODIUM 1 % TD GEL
2.0000 g | Freq: Four times a day (QID) | TRANSDERMAL | 0 refills | Status: DC
Start: 2016-12-31 — End: 2017-01-24

## 2016-12-31 NOTE — Discharge Instructions (Signed)
Toradol injection in office today. Continue etodolac as directed. Voltaren gel on affected area. Heat compress and muscle relaxants as needed. This can take up to 2-3 weeks to completely resolve, but you should be feeling better each week. Follow up with PCP as scheduled for further evaluation and treatment needed.

## 2016-12-31 NOTE — ED Provider Notes (Signed)
[place]    CSN: [id number] Arrival date & time: 12/31/16  [place]     History   Chief Complaint Chief Complaint  Patient presents with  . Shoulder Pain    HPI PURCELL JUNGBLUTH is a 56 y.o. male.   56 year old male with history of diabetes, GERD, hyperlipidemia, hypertension, OSA, comes in for 5 day history of right shoulder pain. Denies injury. Had a E-visit yesterday he was given Etodolac and Baclofen, but without improvement. Patient states symptoms first started after waking up with neck pain. States pain is constant, with exacerbation from movement. Denies numbness, tingling. Patient concerned for bursitis. Patient has type I diabetic, with uncontrolled blood sugars.       Past Medical History:  Diagnosis Date  . Diabetes mellitus   . GERD (gastroesophageal reflux disease)   . Hyperlipidemia   . Hypertension   . OSA (obstructive sleep apnea)     Patient Active Problem List   Diagnosis Date Noted  . Current mild episode of major depressive disorder without prior episode ([place]) 12/02/2016  . Chest tightness 12/02/2016  . Diabetic neuropathy ([place]) 03/01/2016  . Hammer toe, acquired 06/19/2015  . Obesity, Class II, BMI 35.0-39.9, with comorbidity (see actual BMI) 04/24/2015  . Type 1 diabetes mellitus with hyperglycemia, with long-term current use of insulin ([place]) 04/04/2015  . Patellofemoral stress syndrome of left knee 03/19/2015  . Allergic rhinitis due to pollen 11/14/2014  . Equinus deformity of foot, acquired 05/29/2014  . ADHD (attention deficit hyperactivity disorder), inattentive type 10/24/2013  . Vitamin D deficiency 02/28/2013  . Pulmonic stenosis 05/25/2012  . Pure hyperglyceridemia 05/25/2012  . Hyperlipidemia with target LDL less than 100 09/20/2011  . OSA (obstructive sleep apnea) 09/20/2011  . BPH (benign prostatic hyperplasia) 09/20/2011  . Routine general medical examination at a health care facility 09/20/2011    Past Surgical History:   Procedure Laterality Date  . TONSILLECTOMY         Home Medications    Prior to Admission medications   Medication Sig Start Date End Date Taking? Authorizing Provider  Alpha-Lipoic Acid 600 MG CAPS Take by mouth.   Yes [provider]  amphetamine-dextroamphetamine (ADDERALL) 10 MG tablet Take 1 tablet (10 mg total) by mouth 2 (two) times daily. 12/02/16  Yes [name], MD  amphetamine-dextroamphetamine (ADDERALL) 5 MG tablet Take 1 tablet (5 mg total) by mouth daily. 10/12/16  Yes [name], MD  Ascorbic Acid (VITAMIN C) 1000 MG tablet Take 1,000 mg by mouth daily.   Yes [provider]  b complex vitamins tablet Take 1 tablet by mouth daily.   Yes [provider]  baclofen (LIORESAL) 10 MG tablet Take 1 tablet (10 mg total) by mouth 3 (three) times daily as needed for muscle spasms. 12/30/16  Yes Withrow, Elyse Jarvis, FNP  Blood Glucose Monitoring Suppl (FREESTYLE LITE) DEVI Use to check sugar 6 times daily 10/12/16  Yes Philemon Kingdom, MD  Calcium-Magnesium-Vitamin D (CALCIUM 500 PO) Take by mouth.   Yes [provider]  Cholecalciferol (VITAMIN D-3 PO) Take 1,000 mg by mouth daily.   Yes [provider]  DHEA 25 MG CAPS Take 25 mg by mouth.   Yes [provider]  DULoxetine (CYMBALTA) 30 MG capsule Take 1 capsule (30 mg total) by mouth daily. 12/02/16  Yes [name], MD  etodolac (LODINE) 300 MG capsule Take 1 capsule (300 mg total) by mouth 2 (two) times daily. 12/30/16  Yes Withrow,  Elyse Jarvis, FNP  glucose blood (FREESTYLE LITE) test strip Use as instructed to check sugar 6 times daily 06/16/16  Yes Philemon Kingdom, MD  insulin aspart (NOVOLOG) 100 UNIT/ML injection FOR USE IN INSULIN PUMP, TOTAL 250 UNITS PER DAY 11/11/16  Yes Philemon Kingdom, MD  Insulin Infusion Pump Supplies (PARADIGM RESERVOIR 3ML) MISC 1 Device by Does not apply route daily.   Yes [provider]  INSULIN SYRINGE 1CC/29G 29G X 1/2" 1 ML  MISC 1 Syringe by Does not apply route every 3 (three) days. 10/16/12  Yes Renato Shin, MD  Lancets (FREESTYLE) lancets Use as instructed to check sugar 6 times daily. 06/16/16  Yes Philemon Kingdom, MD  losartan (COZAAR) 50 MG tablet TAKE 1 TABLET BY MOUTH ONCE DAILY 09/17/16  Yes [name], MD  Melatonin 5 MG CHEW Chew 2 tablets by mouth at bedtime.   Yes [provider]  metFORMIN (GLUCOPHAGE) 500 MG tablet Take 2 tablets (1,000 mg total) by mouth daily with supper. 12/02/16  Yes [name], MD  Misc Natural Products (TURMERIC CURCUMIN) CAPS Take 1 capsule by mouth.   Yes [provider]  naproxen (NAPROSYN) 500 MG tablet Take 1 tablet (500 mg total) by mouth 2 (two) times daily with a meal. 02/19/16  Yes Philemon Kingdom, MD  pantoprazole (PROTONIX) 40 MG tablet TAKE 1 TABLET BY MOUTH ONCE DAILY 02/08/16  Yes [name], MD  QNASL 80 MCG/ACT AERS PLACE 4 PUFFS INTO THE NOSE DAILY. 09/17/16  Yes [name], MD  simvastatin (ZOCOR) 40 MG tablet Take 1 tablet (40 mg total) by mouth daily. 11/14/14  Yes [name], MD  diclofenac sodium (VOLTAREN) 1 % GEL Apply 2 g topically 4 (four) times daily. 12/31/16   Tasia Catchings, Maury Groninger V, PA-C  glucagon (GLUCAGON EMERGENCY) 1 MG injection Inject 1 mg into the muscle once as needed. 02/05/13   Philemon Kingdom, MD    Family History Family History  Problem Relation Age of Onset  . Colon cancer Maternal Grandfather   . Colon cancer Paternal Grandfather   . Heart disease Father        Valve replacement; pacemaker  . Alcohol abuse Neg Hx   . Asthma Neg Hx   . Diabetes Neg Hx   . Hyperlipidemia Neg Hx   . Hypertension Neg Hx   . Kidney disease Neg Hx   . Stroke Neg Hx     Social History Social History  Substance Use Topics  . Smoking status: Never Smoker  . Smokeless tobacco: Never Used  . Alcohol use No     Allergies   Zetia [ezetimibe]; Lisinopril; Penicillins; and Shellfish allergy   Review of Systems Review of  Systems  Musculoskeletal: Positive for arthralgias and myalgias. Negative for back pain and gait problem.  Skin: Negative for wound.     Physical Exam Triage Vital Signs ED Triage Vitals [12/31/16 1924]  Enc Vitals Group     BP 123/72     Pulse Rate 83     Resp 16     Temp 98.5 F (36.9 C)     Temp Source Oral     SpO2 96 %     Weight      Height      Head Circumference      Peak Flow      Pain Score 7     Pain Loc      Pain Edu?      Excl. in [place]?  No data found.   Updated Vital Signs BP 123/72 (BP Location: Left Arm)   Pulse 83   Temp 98.5 F (36.9 C) (Oral)   Resp 16   SpO2 96%   Physical Exam  Constitutional: He is oriented to person, place, and time. He appears well-developed and well-nourished. No distress.  HENT:  Head: Normocephalic and atraumatic.  Neck: Normal range of motion. Neck supple. No spinous process tenderness and no muscular tenderness present. Normal range of motion present.  Cardiovascular: Normal rate, regular rhythm and normal heart sounds.  Exam reveals no gallop and no friction rub.   No murmur heard. Pulmonary/Chest: Effort normal and breath sounds normal. He has no wheezes. He has no rales.  Musculoskeletal:  Tenderness on palpation of the right shoulder. Full range of motion, though with pain. Strength normal and equal bilaterally. Sensation intact and equal bilaterally. Radial pulses 2+ and equal bilaterally.  Neurological: He is alert and oriented to person, place, and time.     UC Treatments / Results  Labs (all labs ordered are listed, but only abnormal results are displayed) Labs Reviewed - No data to display  EKG  EKG Interpretation None       Radiology No results found.  Procedures Procedures (including critical care time)  Medications Ordered in UC Medications  ketorolac (TORADOL) 30 MG/ML injection 30 mg (30 mg Intramuscular Given 12/31/16 2015)     Initial Impression / Assessment and Plan / UC Course  I  have reviewed the triage vital signs and the nursing notes.  Pertinent labs & imaging results that were available during my care of the patient were reviewed by me and considered in my medical decision making (see chart for details).    Discussed with patient symptoms most likely due to muscle strain, or bursitis. Given patient with diabetes with uncontrolled blood sugar, will defer prednisone treatment. Toradol injection in office. Voltaren gel on affected area. Patient to continue NSAIDs as directed, muscle relaxant as needed, heat compress. Patient to follow up with primary care as scheduled for further evaluation and treatment needed.  Final Clinical Impressions(s) / UC Diagnoses   Final diagnoses:  Acute pain of right shoulder    New Prescriptions Discharge Medication List as of 12/31/2016  7:59 PM    START taking these medications   Details  diclofenac sodium (VOLTAREN) 1 % GEL Apply 2 g topically 4 (four) times daily., Starting Fri 12/31/2016, Normal         Tasia Catchings, Siham Bucaro V, PA-C 12/31/16 2026

## 2016-12-31 NOTE — ED Triage Notes (Signed)
Pt c/o constant right shoulder pain onset 5 days  Reports an E-visit yest and was given Baclofen and etodolac w/no relief.   Thinks it may be bursitis  Sts pain is getting worse and it increases w/activity... Pain is 7/10 at the moment  A&O x4... NAD... Ambulatory

## 2017-01-01 ENCOUNTER — Emergency Department (HOSPITAL_BASED_OUTPATIENT_CLINIC_OR_DEPARTMENT_OTHER)
Admission: EM | Admit: 2017-01-01 | Discharge: 2017-01-01 | Disposition: A | Discharge status: Home / Self Care | Payer: 59 | Attending: Emergency Medicine | Admitting: Emergency Medicine

## 2017-01-01 ENCOUNTER — Emergency Department (HOSPITAL_BASED_OUTPATIENT_CLINIC_OR_DEPARTMENT_OTHER): Payer: 59

## 2017-01-01 ENCOUNTER — Encounter (HOSPITAL_BASED_OUTPATIENT_CLINIC_OR_DEPARTMENT_OTHER): Payer: Self-pay | Admitting: Emergency Medicine

## 2017-01-01 DIAGNOSIS — Z794 Long term (current) use of insulin: Secondary | ICD-10-CM | POA: Insufficient documentation

## 2017-01-01 DIAGNOSIS — I10 Essential (primary) hypertension: Secondary | ICD-10-CM | POA: Diagnosis not present

## 2017-01-01 DIAGNOSIS — M542 Cervicalgia: Secondary | ICD-10-CM | POA: Diagnosis present

## 2017-01-01 DIAGNOSIS — E1065 Type 1 diabetes mellitus with hyperglycemia: Secondary | ICD-10-CM | POA: Diagnosis not present

## 2017-01-01 DIAGNOSIS — E1040 Type 1 diabetes mellitus with diabetic neuropathy, unspecified: Secondary | ICD-10-CM | POA: Diagnosis not present

## 2017-01-01 DIAGNOSIS — M25511 Pain in right shoulder: Secondary | ICD-10-CM | POA: Insufficient documentation

## 2017-01-01 MED ORDER — DIAZEPAM 5 MG PO TABS: 5.0000 mg | ORAL_TABLET | Freq: Two times a day (BID) | ORAL | 0 refills | Status: DC

## 2017-01-01 NOTE — ED Triage Notes (Signed)
Patient states that he has had pain to his neck and right shoulder since last Sunday. The patient has been seen 2 times and givne NSAIDS and he reports that the pain is not any better

## 2017-01-01 NOTE — ED Provider Notes (Signed)
Emergency Department Provider Note   I have reviewed the triage vital signs and the nursing notes.   HISTORY  Chief Complaint Neck Pain   HPI HPI Comments: Zachary Graham is a 56 y.o. male who presents to the Emergency Department complaining of right sided neck pain that started approximately one week ago. He reports that the pain is mainly in his right shoulder at this point. Pain is severe and keeping him from sleeping. Not necessarily worse with movement. No shoulder redness. No pain in other locations. No injury. No neck stiffness or HA. No numbness/tingling in the arm. He had an e-visit yesterday and was prescribed Baclofen which has not improved his symptoms.    Past Medical History:  Diagnosis Date  . Diabetes mellitus   . GERD (gastroesophageal reflux disease)   . Hyperlipidemia   . Hypertension   . OSA (obstructive sleep apnea)     Patient Active Problem List   Diagnosis Date Noted  . Current mild episode of major depressive disorder without prior episode ([place]) 12/02/2016  . Chest tightness 12/02/2016  . Diabetic neuropathy ([place]) 03/01/2016  . Hammer toe, acquired 06/19/2015  . Obesity, Class II, BMI 35.0-39.9, with comorbidity (see actual BMI) 04/24/2015  . Type 1 diabetes mellitus with hyperglycemia, with long-term current use of insulin ([place]) 04/04/2015  . Patellofemoral stress syndrome of left knee 03/19/2015  . Allergic rhinitis due to pollen 11/14/2014  . Equinus deformity of foot, acquired 05/29/2014  . ADHD (attention deficit hyperactivity disorder), inattentive type 10/24/2013  . Vitamin D deficiency 02/28/2013  . Pulmonic stenosis 05/25/2012  . Pure hyperglyceridemia 05/25/2012  . Hyperlipidemia with target LDL less than 100 09/20/2011  . OSA (obstructive sleep apnea) 09/20/2011  . BPH (benign prostatic hyperplasia) 09/20/2011  . Routine general medical examination at a health care facility 09/20/2011    Past Surgical History:  Procedure  Laterality Date  . TONSILLECTOMY      Current Outpatient Rx  . Order #: [id number] Class: Historical Med  . Order #: [id number] Class: Print  . Order #: [id number] Class: Print  . Order #: [id number] Class: Historical Med  . Order #: [id number] Class: Historical Med  . Order #: [id number] Class: Normal  . Order #: [id number] Class: Normal  . Order #: [id number] Class: Historical Med  . Order #: [id number] Class: Historical Med  . Order #: [id number] Class: Historical Med  . Order #: [id number] Class: Print  . Order #: [id number] Class: Normal  . Order #: [id number] Class: Normal  . Order #: [id number] Class: Normal  . Order #: [id number] Class: Normal  . Order #: [id number] Class: Normal  . Order #: [id number] Class: Normal  . Order #: [id number] Class: Historical Med  . Order #: [id number] Class: Normal  . Order #: [id number] Class: Normal  . Order #: [id number] Class: Normal  . Order #: [id number] Class: Historical Med  . Order #: [id number] Class: Normal  . Order #: [id number] Class: Historical Med  . Order #: [id number] Class: Normal  . Order #: [id number] Class: Normal  . Order #: [id number] Class: Normal  . Order #: [id number] Class: Normal    Allergies Zetia [ezetimibe]; Lisinopril; Penicillins; and Shellfish allergy  Family History  Problem Relation Age of Onset  . Colon cancer Maternal Grandfather   . Colon cancer Paternal Grandfather   . Heart disease Father        Valve replacement; pacemaker  . Alcohol abuse Neg Hx   . Asthma Neg Hx   . Diabetes Neg Hx   . Hyperlipidemia Neg Hx   . Hypertension Neg Hx   . Kidney disease Neg Hx   .  Stroke Neg Hx     Social History Social History  Substance Use Topics  . Smoking status: Never Smoker  . Smokeless tobacco: Never Used  . Alcohol use No    Review of Systems  Constitutional: No fever/chills Eyes: No visual changes. ENT: No sore throat. Cardiovascular: Denies chest pain. Respiratory: Denies shortness of breath. Gastrointestinal: No abdominal pain.  No  nausea, no vomiting.  No diarrhea.  No constipation. Genitourinary: Negative for dysuria. Musculoskeletal: Negative for back pain. Positive right lateral neck and right shoulder pain.  Skin: Negative for rash. Neurological: Negative for headaches, focal weakness or numbness.  10-point ROS otherwise negative.  ____________________________________________   PHYSICAL EXAM:  VITAL SIGNS: ED Triage Vitals  Enc Vitals Group     BP 01/01/17 1716 (!) 163/80     Pulse Rate 01/01/17 1716 72     Resp 01/01/17 1716 20     Temp 01/01/17 1716 97.9 F (36.6 C)     Temp Source 01/01/17 1716 Oral     SpO2 01/01/17 1716 100 %     Weight 01/01/17 1714 230 lb (104.3 kg)     Height 01/01/17 1714 5\' 10"  (1.778 m)     Pain Score 01/01/17 1714 8   Constitutional: Alert and oriented. Well appearing and in no acute distress. Eyes: Conjunctivae are normal.  Head: Atraumatic. Nose: No congestion/rhinnorhea. Mouth/Throat: Mucous membranes are moist.  Neck: No stridor.  Cardiovascular: Normal rate, regular rhythm. Good peripheral circulation. Grossly normal heart sounds.   Respiratory: Normal respiratory effort.  No retractions. Lungs CTAB. Musculoskeletal: No lower extremity tenderness nor edema. No gross deformities of extremities. Full ROM of the right shoulder with no overlying erythema or warmth. No midline cervical spine tenderness. No meningismus.  Neurologic:  Normal speech and language. No gross focal neurologic deficits are appreciated.  Skin:  Skin is warm, dry and intact. No rash noted.  ____________________________________________  [id number]  Dg Shoulder Right  Result Date: 01/01/2017 CLINICAL DATA:  Right shoulder pain for 5 days EXAM: RIGHT SHOULDER - 2+ VIEW COMPARISON:  None. FINDINGS: There is no evidence of fracture or dislocation. There is no evidence of arthropathy or other focal bone abnormality. Soft tissues are unremarkable. IMPRESSION: Negative. Electronically Signed   By: Donavan Foil M.D.   On: 01/01/2017 18:28    ____________________________________________   PROCEDURES  Procedure(s) performed:   Procedures  None ____________________________________________   INITIAL IMPRESSION / ASSESSMENT AND PLAN / ED COURSE  Pertinent labs & imaging results that were available during my care of the patient were reviewed by me and considered in my medical decision making (see chart for details).  Patient presents to the ED with right sided neck and right shoulder pain. No midline c-spine tenderness. No evidence of infectious process or septic joint. Normal ROM of the right shoulder. Normal plain film. Suspect MSK etiology. Discharging home with Valium and will d/c Baclofen. Has appt with sports med on Tuesday.   At this time, I do not feel there is any life-threatening condition present. I have reviewed and discussed all results (EKG, imaging, lab, urine as appropriate), exam findings with patient. I have reviewed nursing notes and appropriate previous records.  I feel the patient is safe to be discharged home without further emergent workup. Discussed usual and customary return precautions. Patient and family (if present) verbalize understanding and are comfortable with this plan.  Patient will follow-up with their primary care provider. If they do not have a primary care provider, information  for follow-up has been provided to them. All questions have been answered.  ____________________________________________  FINAL CLINICAL IMPRESSION(S) / ED DIAGNOSES  Final diagnoses:  Acute pain of right shoulder     MEDICATIONS GIVEN DURING THIS VISIT:  Medications - No data to display   NEW OUTPATIENT MEDICATIONS STARTED DURING THIS VISIT:  Discharge Medication List as of 01/01/2017  7:10 PM    START taking these medications   Details  diazepam (VALIUM) 5 MG tablet Take 1 tablet (5 mg total) by mouth 2 (two) times daily., Starting Sat 01/01/2017, Print         Note:  This document was prepared using Dragon voice recognition software and may include unintentional dictation errors.  Nanda Quinton, MD Emergency Medicine    Long, [facility], MD 01/02/17 [phone]

## 2017-01-01 NOTE — Discharge Instructions (Signed)

## 2017-01-03 NOTE — Progress Notes (Signed)
[name] [address] [place], [place] [address] Phone: [phone] Subjective:    I'm seeing this patient by the request  of:    CC:   [id number]  Zachary Graham is a 56 y.o. male coming in with complaint of right shoulder pain. He woke up on Sunday with a "crick" in his neck. His pain is constant. He has tried ice, heat, Valium and he has had absolutely no relief. He does note that he did 4 hours worth of shoulder abduction by using his mouse to move emails back and forth in his inbox at work. Occasional numbness in the right hand. He notes that he has thrown up due to the pain.   Onset- Sunday  Location- right neck/shoulder Duration- constant Character-sharp Aggravating factors- range of motion Reliving factors-  nothing Therapies tried- ice, heat,  Severity-8/10    Past Medical History:  Diagnosis Date  . Diabetes mellitus   . GERD (gastroesophageal reflux disease)   . Hyperlipidemia   . Hypertension   . OSA (obstructive sleep apnea)    Past Surgical History:  Procedure Laterality Date  . TONSILLECTOMY     Social History   Social History  . Marital status: Married    Spouse name: N/A  . Number of children: 3  . Years of education: N/A   Occupational History  . RADIATION SAFETY OFFICER [employer]   Social History Main Topics  . Smoking status: Never Smoker  . Smokeless tobacco: Never Used  . Alcohol use No  . Drug use: No  . Sexual activity: Yes    Partners: Female   Other Topics Concern  . Not on file   Social History Narrative   Regular exercise: runs 3 miles 3x week, takes one day off per week to rest   Caffeine use: stopped all diet soda on 04/23/15 per advice from Sports MD   Allergies  Allergen Reactions  . Zetia [Ezetimibe]     abd cramps  . Lisinopril   . Penicillins   . Shellfish Allergy    Family History  Problem Relation Age of Onset  . Colon cancer Maternal Grandfather   . Colon cancer Paternal  Grandfather   . Heart disease Father        Valve replacement; pacemaker  . Alcohol abuse Neg Hx   . Asthma Neg Hx   . Diabetes Neg Hx   . Hyperlipidemia Neg Hx   . Hypertension Neg Hx   . Kidney disease Neg Hx   . Stroke Neg Hx      Past medical history, social, surgical and family history all reviewed in electronic medical record.  No pertanent information unless stated regarding to the chief complaint.   Review of Systems:Review of systems updated and as accurate as of 01/03/17  No headache, visual changes, nausea, vomiting, diarrhea, constipation, dizziness, abdominal pain, skin rash, fevers, chills, night sweats, weight loss, swollen lymph nodes, body aches, joint swelling,  chest pain, shortness of breath, mood changes. Positive muscle aches  Objective  There were no vitals taken for this visit. Systems examined below as of 01/03/17   General: No apparent distress alert and oriented x3 mood and affect normal, dressed appropriately.  HEENT: Pupils equal, extraocular movements intact  Respiratory: Patient's speak in full sentences and does not appear short of breath  Cardiovascular: No lower extremity edema, non tender, no erythema  Skin: Warm dry intact with no signs of infection or rash on extremities  or on axial skeleton.  Abdomen: Soft nontender  Neuro: Cranial nerves II through XII are intact, neurovascularly intact in all extremities with 2+ DTRs and 2+ pulses.  Lymph: No lymphadenopathy of posterior or anterior cervical chain or axillae bilaterally.  Gait normal with good balance and coordination.  MSK:  Non tender with full range of motion and good stability and symmetric strength and tone of  elbows, wrist, hip, knee and ankles bilaterally.  Shoulder: Right Inspection reveals no abnormalities, atrophy or asymmetry. Palpation is normal with no tenderness over AC joint or bicipital groove. ROM is full in all planes. Rotator cuff strength normal throughout. Very mild  impingement noted Speeds and Yergason's tests normal. No labral pathology noted with negative Obrien's, negative clunk and good stability. Normal scapular function observed. No painful arc and no drop arm sign. No apprehension sign Contralateral arm unremarkable  Neck: Inspection loss of lordosis. No palpable stepoffs. Positive Spurling's maneuver. Decreased range of motion with right-sided rotation and side bending significantly minimal extension as well as Grip strength and sensation normal in bilateral hands Weakness in the C8 distribution on the right No sensory change to C4 to T1 Negative Hoffman sign bilaterally Reflexes normal  MSK US performed of: Right This study was ordered, performed, and interpreted by [name] D.O.  Shoulder:   Supraspinatus:  Appears normal on long and transverse views, no bursal bulge seen with shoulder abduction on impingement view. Infraspinatus:  Appears normal on long and transverse views. Subscapularis:  Appears normal on long and transverse views. Teres Minor:  Appears normal on long and transverse views. AC joint:  Capsule undistended, no geyser sign. Glenohumeral Joint:  Appears normal without effusion. Glenoid Labrum:  Intact without visualized tears. Biceps Tendon:  Appears normal on long and transverse views, no fraying of tendon, tendon located in intertubercular groove, no subluxation with shoulder internal or external rotation. No increased power doppler signal. Impression: Normal shoulder      Impression and Recommendations:     This case required medical decision making of moderate complexity.      Note: This dictation was prepared with Dragon dictation along with smaller phrase technology. Any transcriptional errors that result from this process are unintentional.

## 2017-01-04 ENCOUNTER — Ambulatory Visit (INDEPENDENT_AMBULATORY_CARE_PROVIDER_SITE_OTHER)
Admission: RE | Admit: 2017-01-04 | Discharge: 2017-01-04 | Disposition: A | Discharge status: Home / Self Care | Payer: 59 | Source: Ambulatory Visit | Attending: Family Medicine | Admitting: Family Medicine

## 2017-01-04 ENCOUNTER — Ambulatory Visit (INDEPENDENT_AMBULATORY_CARE_PROVIDER_SITE_OTHER): Payer: 59 | Admitting: Family Medicine

## 2017-01-04 ENCOUNTER — Encounter: Payer: Self-pay | Admitting: Family Medicine

## 2017-01-04 ENCOUNTER — Ambulatory Visit: Payer: Self-pay

## 2017-01-04 VITALS — BP 150/90 | HR 78 | Ht 69.0 in | Wt 248.0 lb

## 2017-01-04 DIAGNOSIS — M5412 Radiculopathy, cervical region: Secondary | ICD-10-CM

## 2017-01-04 DIAGNOSIS — M542 Cervicalgia: Secondary | ICD-10-CM

## 2017-01-04 DIAGNOSIS — M5010 Cervical disc disorder with radiculopathy, unspecified cervical region: Secondary | ICD-10-CM | POA: Insufficient documentation

## 2017-01-04 DIAGNOSIS — M25511 Pain in right shoulder: Secondary | ICD-10-CM | POA: Diagnosis not present

## 2017-01-04 DIAGNOSIS — H5213 Myopia, bilateral: Secondary | ICD-10-CM | POA: Diagnosis not present

## 2017-01-04 LAB — HM DIABETES EYE EXAM

## 2017-01-04 MED ORDER — MELOXICAM 15 MG PO TABS: 15.0000 mg | ORAL_TABLET | Freq: Every day | ORAL | 0 refills | Status: DC

## 2017-01-04 MED ORDER — METHYLPREDNISOLONE ACETATE 80 MG/ML IJ SUSP
80.0000 mg | Freq: Once | INTRAMUSCULAR | Status: AC
  Administered 2017-01-04: 80 mg via INTRAMUSCULAR

## 2017-01-04 MED ORDER — KETOROLAC TROMETHAMINE 60 MG/2ML IM SOLN
60.0000 mg | Freq: Once | INTRAMUSCULAR | Status: AC
  Administered 2017-01-04: 60 mg via INTRAMUSCULAR

## 2017-01-04 MED ORDER — TRAMADOL HCL 50 MG PO TABS: 50.0000 mg | ORAL_TABLET | Freq: Two times a day (BID) | ORAL | 0 refills | Status: DC | PRN

## 2017-01-04 MED FILL — traMADol HCL 50 MG TABS: 50 | 15 days supply | Qty: 30 | Fill #0

## 2017-01-04 MED FILL — MELOXICAM 15 MG TABLET: 15 | 30 days supply | Qty: 30 | Fill #0

## 2017-01-04 NOTE — Assessment & Plan Note (Signed)
Cervical radiculopathy. Patient does have an in the C8 distribution. Patient is unable to tolerate the gabapentin. Encourage patient to take the Cymbalta. Given 2 injections today. Patient is a type I diabetic but does have a continuous glucose meter. Encourage him to monitor blood sugars closely with him having a crit a steroid injection today. Worsening symptoms to seek medical attention immediately. We discussed icing regimen. Patient come back again in 1 week. X-rays of cervical spine pending

## 2017-01-04 NOTE — Patient Instructions (Addendum)
Good to see you  Ice 20 minutes 2 times daily. Usually after activity and before bed. Neck xray today  2 injections to help with pain but please watch blood sugar Cymbalta 30 mg daily  Meloxicam daily for 10 days then as needed Tramadol up to 2 times a day  See me again in 1 week (OK to double book)

## 2017-01-06 ENCOUNTER — Encounter: Payer: Self-pay | Admitting: Family Medicine

## 2017-01-06 ENCOUNTER — Other Ambulatory Visit: Payer: Self-pay | Admitting: Internal Medicine

## 2017-01-06 DIAGNOSIS — E103299 Type 1 diabetes mellitus with mild nonproliferative diabetic retinopathy without macular edema, unspecified eye: Secondary | ICD-10-CM

## 2017-01-07 ENCOUNTER — Encounter: Payer: Self-pay | Admitting: Family Medicine

## 2017-01-07 DIAGNOSIS — M5412 Radiculopathy, cervical region: Secondary | ICD-10-CM

## 2017-01-07 NOTE — Telephone Encounter (Signed)
Pt wife called stating she does not believe he can wait for Tuesday to have a MRI ordered, he ca barely move or walk and is in immense pain. Can an MRI be ordered today and also his preference is to have it done at [facility] but they will anywhere where it can be done the soonest.  Please call wife back when ordered.

## 2017-01-11 ENCOUNTER — Encounter: Payer: Self-pay | Admitting: Family Medicine

## 2017-01-11 ENCOUNTER — Ambulatory Visit
Admission: RE | Admit: 2017-01-11 | Discharge: 2017-01-11 | Disposition: A | Discharge status: Home / Self Care | Payer: 59 | Source: Ambulatory Visit | Attending: Family Medicine | Admitting: Family Medicine

## 2017-01-11 ENCOUNTER — Ambulatory Visit (INDEPENDENT_AMBULATORY_CARE_PROVIDER_SITE_OTHER): Payer: 59 | Admitting: Family Medicine

## 2017-01-11 VITALS — BP 134/82 | HR 82 | Ht 69.0 in | Wt 244.0 lb

## 2017-01-11 DIAGNOSIS — M50223 Other cervical disc displacement at C6-C7 level: Secondary | ICD-10-CM | POA: Diagnosis not present

## 2017-01-11 DIAGNOSIS — M5412 Radiculopathy, cervical region: Secondary | ICD-10-CM

## 2017-01-11 MED ORDER — TIZANIDINE HCL 4 MG PO TABS: 4.0000 mg | ORAL_TABLET | Freq: Four times a day (QID) | ORAL | 0 refills | Status: DC | PRN

## 2017-01-11 MED ORDER — METHYLPREDNISOLONE ACETATE 80 MG/ML IJ SUSP
80.0000 mg | Freq: Once | INTRAMUSCULAR | Status: AC
  Administered 2017-01-11: 80 mg via INTRAMUSCULAR

## 2017-01-11 MED ORDER — KETOROLAC TROMETHAMINE 60 MG/2ML IM SOLN
60.0000 mg | Freq: Once | INTRAMUSCULAR | Status: AC
  Administered 2017-01-11: 60 mg via INTRAMUSCULAR

## 2017-01-11 MED ORDER — OXYCODONE-ACETAMINOPHEN 5-325 MG PO TABS: 1.0000 | ORAL_TABLET | Freq: Four times a day (QID) | ORAL | 0 refills | Status: DC | PRN

## 2017-01-11 MED FILL — OXYCODONE-ACETAMINOPHEN 5-3: 5-325 | 5 days supply | Qty: 20 | Fill #0

## 2017-01-11 MED FILL — tiZANidine HCL 4 MG TABS: 4 | 7 days supply | Qty: 30 | Fill #0

## 2017-01-11 NOTE — Patient Instructions (Addendum)
Good to see you  Percocet up to 3 times a day for severe pain but do not take with the tramadol Zanaflex up to 3 times a day and take it at home first  2 injections today to help pain  Epidural of the neck ordered I do want you to see neurosurgery as well and they will call you  If we go forward with the epidural I want to see you again 1 week after the injection.  Worsening pain or weakness you need to go to the emergency room

## 2017-01-11 NOTE — Assessment & Plan Note (Signed)
Patient is having worsening symptoms with increasing weakness of the extremity. Patient's MRI does correspond with patient's symptoms fairly significantly. Muscle relaxer given, tramadol has for breakthrough pain and given some mild Percocet. Patient will be set up for an epidural but also referred to neurosurgery for further evaluation. Patient knows that worsening symptoms to go to the emergency room immediately. Patient is accompanied with wife and we didn't answer all questions. If patient does well with the epidural he will follow-up with me again 2 weeks after the injection.

## 2017-01-11 NOTE — Progress Notes (Signed)
[facility] [address], [place] [address] Phone: [phone] Subjective:    I'm seeing this patient by the request  of:    CC: Neck pain and shoulder pain  [id number]  RADEN BYINGTON is a 56 y.o. male coming in with complaint of right shoulder pain. Patient was found to have more of a cervical radiculopathy. Very minimal arthritis noted on x-ray. Was sent for an MRI. 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 States that the pain is worsening overall. Patient states that there is increasing weakness.    Past Medical History:  Diagnosis Date  . Diabetes mellitus   . GERD (gastroesophageal reflux disease)   . Hyperlipidemia   . Hypertension   . OSA (obstructive sleep apnea)    Past Surgical History:  Procedure Laterality Date  . TONSILLECTOMY     Social History   Social History  . Marital status: Married    Spouse name: N/A  . Number of children: 3  . Years of education: N/A   Occupational History  . RADIATION SAFETY OFFICER    Social History Main Topics  . Smoking status: Never Smoker  . Smokeless tobacco: Never Used  . Alcohol use No  . Drug use: No  . Sexual activity: Yes    Partners: Female   Other Topics Concern  . None   Social History Narrative   Regular exercise: runs 3 miles 3x week, takes one day off per week to rest   Caffeine use: stopped all diet soda on 04/23/15 per advice from Sports MD   Allergies  Allergen Reactions  . Zetia [Ezetimibe]     abd cramps  . Lisinopril   . Penicillins   . Shellfish Allergy    Family History  Problem Relation Age of Onset  . Colon cancer Maternal Grandfather   . Colon cancer Paternal Grandfather   . Heart disease Father        Valve replacement; pacemaker  . Alcohol abuse Neg Hx   . Asthma Neg Hx   . Diabetes Neg Hx   . Hyperlipidemia Neg Hx   . Hypertension Neg Hx   .  Kidney disease Neg Hx   . Stroke Neg Hx      Past medical history, social, surgical and family history all reviewed in electronic medical record.  No pertanent information unless stated regarding to the chief complaint.   Review of Systems: No headache, visual changes, nausea, vomiting, diarrhea, constipation, dizziness, abdominal pain, skin rash, fevers, chills, night sweats, weight loss, swollen lymph nodes, body aches, joint swelling, chest pain, shortness of breath, mood changes. Positive muscle aches   Objective  Height 5\' 9"  (1.753 m), weight 244 lb (110.7 kg). Systems examined below as of 01/11/17   Systems examined below as of 01/11/17 General: NAD A&O x3 mood, affect normal very uncomfortable  HEENT: Pupils equal, extraocular movements intact no nystagmus Respiratory: not short of breath at rest or with speaking Cardiovascular: No lower extremity edema, non tender Skin: Warm dry intact with no signs of infection or rash on extremities or on axial skeleton. Abdomen: Soft nontender, no masses Neuro: Cranial nerves  intact, neurovascularly intact in all extremities with 2+ DTRs and 2+ pulses. Lymph: No lymphadenopathy appreciated today  Gait normal with good balance and coordination.  MSK: Non tender with full range of motion and good stability and symmetric strength and tone of  shoulders, elbows, wrist,  knee hips and ankles bilaterally.    Neck: Inspection loss of lordosis. No palpable stepoffs. Positive Spurling's maneuver. Lacks last 10 of extension Grip strength is worsening from previous exam at this point. Patient does also have some mild numbness in the C8 distribution. Negative Hoffman sign bilaterally Reflexes normal   Impression and Recommendations:     This case required medical decision making of moderate complexity.      Note: This dictation was prepared with Dragon dictation along with smaller phrase technology. Any transcriptional errors that result  from this process are unintentional.

## 2017-01-12 ENCOUNTER — Ambulatory Visit
Admission: RE | Admit: 2017-01-12 | Discharge: 2017-01-12 | Disposition: A | Discharge status: Home / Self Care | Payer: 59 | Source: Ambulatory Visit | Attending: Family Medicine | Admitting: Family Medicine

## 2017-01-12 DIAGNOSIS — M5412 Radiculopathy, cervical region: Secondary | ICD-10-CM

## 2017-01-12 DIAGNOSIS — M50222 Other cervical disc displacement at C5-C6 level: Secondary | ICD-10-CM | POA: Diagnosis not present

## 2017-01-12 MED ORDER — TRIAMCINOLONE ACETONIDE 40 MG/ML IJ SUSP (RADIOLOGY)
60.0000 mg | Freq: Once | INTRAMUSCULAR | Status: AC
  Administered 2017-01-12: 60 mg via EPIDURAL

## 2017-01-12 MED ORDER — IOPAMIDOL (ISOVUE-M 300) INJECTION 61%
1.0000 mL | Freq: Once | INTRAMUSCULAR | Status: AC | PRN
  Administered 2017-01-12: 1 mL via EPIDURAL

## 2017-01-12 NOTE — Discharge Instructions (Signed)

## 2017-01-18 ENCOUNTER — Encounter: Payer: Self-pay | Admitting: Family Medicine

## 2017-01-18 DIAGNOSIS — M5412 Radiculopathy, cervical region: Secondary | ICD-10-CM

## 2017-01-18 DIAGNOSIS — G4733 Obstructive sleep apnea (adult) (pediatric): Secondary | ICD-10-CM | POA: Diagnosis not present

## 2017-01-19 DIAGNOSIS — M4722 Other spondylosis with radiculopathy, cervical region: Secondary | ICD-10-CM | POA: Diagnosis not present

## 2017-01-19 DIAGNOSIS — I10 Essential (primary) hypertension: Secondary | ICD-10-CM | POA: Diagnosis not present

## 2017-01-19 DIAGNOSIS — M5020 Other cervical disc displacement, unspecified cervical region: Secondary | ICD-10-CM | POA: Diagnosis not present

## 2017-01-19 DIAGNOSIS — Z6836 Body mass index (BMI) 36.0-36.9, adult: Secondary | ICD-10-CM | POA: Diagnosis not present

## 2017-01-19 DIAGNOSIS — M5030 Other cervical disc degeneration, unspecified cervical region: Secondary | ICD-10-CM | POA: Diagnosis not present

## 2017-01-19 DIAGNOSIS — M5412 Radiculopathy, cervical region: Secondary | ICD-10-CM | POA: Diagnosis not present

## 2017-01-19 MED FILL — NovoLOG 100 UNIT/ML SOLN: 100 | 24 days supply | Qty: 60 | Fill #2

## 2017-01-20 ENCOUNTER — Ambulatory Visit (INDEPENDENT_AMBULATORY_CARE_PROVIDER_SITE_OTHER): Payer: 59 | Admitting: Family Medicine

## 2017-01-20 ENCOUNTER — Encounter: Payer: Self-pay | Admitting: Family Medicine

## 2017-01-20 VITALS — BP 126/74 | HR 91 | Ht 69.0 in | Wt 249.0 lb

## 2017-01-20 DIAGNOSIS — M5412 Radiculopathy, cervical region: Secondary | ICD-10-CM | POA: Diagnosis not present

## 2017-01-20 NOTE — Assessment & Plan Note (Signed)
Patient does show improvement after the epidural. We did discuss the possibility of long-term nerves injury. Patient though has made some improvement. Patient is even shown and the reflexes seem to be better today than what they were previously. Patient did discuss surgical options with neurosurgery. We will given him other suggestions as well so we have more choices. Patient has elected to try physical therapy. Patient knows if worsening symptoms to call sooner. Patient will come back again in 4 weeks. We discussed other different medication changes which patient declined.

## 2017-01-20 NOTE — Patient Instructions (Signed)
Good to see you doing so well.  Ice is still your friend Continue the cymbalta at the same dose.  No other change but use ibuprofen when needed PT will be calling you  Watch the weakness Send me a message in 2 weeks to make sure you are doing well  See me again in 4 weeks.

## 2017-01-20 NOTE — Progress Notes (Signed)
[name] [address] [place],  [address] Phone: [phone] Subjective:     CC: Neck pain and shoulder pain  [id number]  Zachary Graham is a 56 y.o. male coming in with complaint of right shoulder pain. Patient was found to have more of a cervical radiculopathy. Very minimal arthritis noted on x-ray. Was sent for an MRI. 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 Patient was having worsening symptoms and did elect to have an epidural. Patient states that 70% of the pain has improved. States that the weakness is still there but is improving in the hand but continues to have some weakness of the bicep. Did see neurosurgery who did discuss the possibility of surgical intervention. Patient at this point with the improvement would like to hold on the once no what else he can do to try to continue to improve.    Past Medical History:  Diagnosis Date  . Diabetes mellitus   . GERD (gastroesophageal reflux disease)   . Hyperlipidemia   . Hypertension   . OSA (obstructive sleep apnea)    Past Surgical History:  Procedure Laterality Date  . TONSILLECTOMY     Social History   Social History  . Marital status: Married    Spouse name: N/A  . Number of children: 3  . 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 Smoking status: Never Smoker  . Smokeless tobacco: Never Used  . Alcohol use No  . Drug use: No  . Sexual activity: Yes    Partners: Female   Other Topics Concern  . None   Social History Narrative   Regular exercise: runs 3 miles 3x week, takes one day off per week to rest   Caffeine use: stopped all diet soda on 04/23/15 per advice from Sports MD   Allergies  Allergen Reactions  . Zetia [Ezetimibe]     abd cramps  . Lisinopril   . Penicillins   . Shellfish Allergy     Family History  Problem Relation Age of Onset  . Colon cancer Maternal Grandfather   . Colon cancer Paternal Grandfather   . Heart disease Father        Valve replacement; pacemaker  . Alcohol abuse Neg Hx   . Asthma Neg Hx   . Diabetes Neg Hx   . Hyperlipidemia Neg Hx   . Hypertension Neg Hx   . Kidney disease Neg Hx   . Stroke Neg Hx      Past medical history, social, surgical and family history all reviewed in electronic medical record.  No pertanent information unless stated regarding to the chief complaint.   Review of Systems: No headache, visual changes, nausea, vomiting, diarrhea, constipation, dizziness, abdominal pain, skin rash, fevers, chills, night sweats, weight loss, swollen lymph nodes, body aches, joint swelling, chest pain, shortness of breath, mood changes.  Mild positive muscle aches   Objective  Blood pressure 126/74, pulse 91, height 5\' 9"  (1.753 m), weight 249 lb (112.9 kg), SpO2 96 %.   Systems examined below as of 01/20/17 General: NAD A&O x3 mood, affect normal  HEENT: Pupils equal, extraocular movements intact no nystagmus Respiratory: not short of breath at rest or with speaking Cardiovascular: No lower extremity edema, non tender Skin: Warm dry intact with no signs of infection or rash on  extremities or on axial skeleton. Abdomen: Soft nontender, no masses Neuro: Cranial nerves  intact, neurovascularly intact in all extremities with 2+ DTRs and 2+ pulses. Lymph: No lymphadenopathy appreciated today  Gait normal with good balance and coordination.  MSK: Non tender with full range of motion and good stability and symmetric strength and tone of shoulders, elbows, wrist,  knee hips and ankles bilaterally.    Neck: Inspection unremarkable. No palpable stepoffs. Continue mild positive Spurling's maneuver. Lacking the last 5-10 of rotation and side bent bilaterally. Grip strength still mild weakness noted on the right compared to the left Mild  weakness of strength in the C5 distribution on the right No sensory change to C4 to T1 Negative Hoffman sign bilaterally Reflexes normal   Impression and Recommendations:     This case required medical decision making of moderate complexity.      Note: This dictation was prepared with Dragon dictation along with smaller phrase technology. Any transcriptional errors that result from this process are unintentional.

## 2017-01-24 ENCOUNTER — Ambulatory Visit (INDEPENDENT_AMBULATORY_CARE_PROVIDER_SITE_OTHER): Payer: 59 | Admitting: Internal Medicine

## 2017-01-24 ENCOUNTER — Ambulatory Visit (INDEPENDENT_AMBULATORY_CARE_PROVIDER_SITE_OTHER): Payer: 59 | Admitting: Physical Therapy

## 2017-01-24 ENCOUNTER — Ambulatory Visit (INDEPENDENT_AMBULATORY_CARE_PROVIDER_SITE_OTHER): Payer: 59

## 2017-01-24 ENCOUNTER — Encounter: Payer: Self-pay | Admitting: Internal Medicine

## 2017-01-24 ENCOUNTER — Encounter: Payer: Self-pay | Admitting: Physical Therapy

## 2017-01-24 VITALS — BP 134/80 | HR 80 | Wt 241.0 lb

## 2017-01-24 DIAGNOSIS — E1065 Type 1 diabetes mellitus with hyperglycemia: Secondary | ICD-10-CM

## 2017-01-24 DIAGNOSIS — M5010 Cervical disc disorder with radiculopathy, unspecified cervical region: Secondary | ICD-10-CM | POA: Diagnosis not present

## 2017-01-24 DIAGNOSIS — M5412 Radiculopathy, cervical region: Secondary | ICD-10-CM

## 2017-01-24 DIAGNOSIS — M542 Cervicalgia: Secondary | ICD-10-CM

## 2017-01-24 DIAGNOSIS — Z23 Encounter for immunization: Secondary | ICD-10-CM

## 2017-01-24 DIAGNOSIS — E1142 Type 2 diabetes mellitus with diabetic polyneuropathy: Secondary | ICD-10-CM

## 2017-01-24 DIAGNOSIS — F900 Attention-deficit hyperactivity disorder, predominantly inattentive type: Secondary | ICD-10-CM | POA: Diagnosis not present

## 2017-01-24 MED ORDER — AMPHETAMINE-DEXTROAMPHETAMINE 5 MG PO TABS: 5.0000 mg | ORAL_TABLET | Freq: Every day | ORAL | 0 refills | Status: DC

## 2017-01-24 MED ORDER — AMPHETAMINE-DEXTROAMPHETAMINE 10 MG PO TABS: 10.0000 mg | ORAL_TABLET | Freq: Two times a day (BID) | ORAL | 0 refills | Status: DC

## 2017-01-24 NOTE — Patient Instructions (Signed)

## 2017-01-24 NOTE — Progress Notes (Signed)
Subjective:  Patient ID: Zachary Graham, male    DOB: [birth year]  Age: 56 y.o. MRN: [id number]  CC: ADHD   HPI Zachary Graham presents for f/up - He has recently been diagnosed with a disc herniation in the cervical spine. He has had neck pain that radiates into his right upper extremity and bilateral upper extremity weakness worse on the right than the left. He had an epidural steroid injection which decreased his pain from a 9 down to a 3. He has stopped taking all medications for pain. He saw a neurosurgeon and it was recommended that he have a discectomy. He is contemplating surgical intervention.  Pt states ADD status overall stable on current meds with overall good compliance and tolerability, and good effectiveness with respect to ability for concentration and task completion.  Outpatient Medications Prior to Visit  Medication Sig Dispense Refill  . Alpha-Lipoic Acid 600 MG CAPS Take by mouth.    . Ascorbic Acid (VITAMIN C) 1000 MG tablet Take 1,000 mg by mouth daily.    Marland Kitchen b complex vitamins tablet Take 1 tablet by mouth daily.    . Blood Glucose Monitoring Suppl (FREESTYLE LITE) DEVI Use to check sugar 6 times daily 1 each 1  . Calcium-Magnesium-Vitamin D (CALCIUM 500 PO) Take by mouth.    . Cholecalciferol (VITAMIN D-3 PO) Take 1,000 mg by mouth daily.    Marland Kitchen DHEA 25 MG CAPS Take 25 mg by mouth.    . DULoxetine (CYMBALTA) 30 MG capsule Take 1 capsule (30 mg total) by mouth daily. 30 capsule 2  . glucagon (GLUCAGON EMERGENCY) 1 MG injection Inject 1 mg into the muscle once as needed. 2 each prn  . glucose blood (FREESTYLE LITE) test strip Use as instructed to check sugar 6 times daily 400 each 5  . insulin aspart (NOVOLOG) 100 UNIT/ML injection FOR USE IN INSULIN PUMP, TOTAL 250 UNITS PER DAY 300 mL 11  . Insulin Infusion Pump Supplies (PARADIGM RESERVOIR 3ML) MISC 1 Device by Does not apply route daily.    . INSULIN SYRINGE 1CC/29G 29G X 1/2" 1 ML MISC 1 Syringe by Does not apply route  every 3 (three) days. 10 each 3  . Lancets (FREESTYLE) lancets Use as instructed to check sugar 6 times daily. 400 each 5  . losartan (COZAAR) 50 MG tablet TAKE 1 TABLET BY MOUTH ONCE DAILY 90 tablet 1  . Melatonin 5 MG CHEW Chew 2 tablets by mouth at bedtime.    . metFORMIN (GLUCOPHAGE) 500 MG tablet Take 2 tablets (1,000 mg total) by mouth daily with supper. 180 tablet 1  . Misc Natural Products (TURMERIC CURCUMIN) CAPS Take 1 capsule by mouth.    . pantoprazole (PROTONIX) 40 MG tablet TAKE 1 TABLET BY MOUTH ONCE DAILY 90 tablet 3  . QNASL 80 MCG/ACT AERS PLACE 4 PUFFS INTO THE NOSE DAILY. 8.7 g 5  . simvastatin (ZOCOR) 40 MG tablet Take 1 tablet (40 mg total) by mouth daily. 90 tablet 3  . amphetamine-dextroamphetamine (ADDERALL) 10 MG tablet Take 1 tablet (10 mg total) by mouth 2 (two) times daily. 60 tablet 0  . amphetamine-dextroamphetamine (ADDERALL) 5 MG tablet Take 1 tablet (5 mg total) by mouth daily. 30 tablet 0  . baclofen (LIORESAL) 10 MG tablet Take 1 tablet (10 mg total) by mouth 3 (three) times daily as needed for muscle spasms. 30 each 0  . diazepam (VALIUM) 5 MG tablet Take 1 tablet (5 mg total) by mouth 2 (  two) times daily. 10 tablet 0  . diclofenac sodium (VOLTAREN) 1 % GEL Apply 2 g topically 4 (four) times daily. 1 Tube 0  . etodolac (LODINE) 300 MG capsule Take 1 capsule (300 mg total) by mouth 2 (two) times daily. 20 capsule 0  . meloxicam (MOBIC) 15 MG tablet Take 1 tablet (15 mg total) by mouth daily. 30 tablet 0  . naproxen (NAPROSYN) 500 MG tablet Take 1 tablet (500 mg total) by mouth 2 (two) times daily with a meal. 30 tablet 3  . oxyCODONE-acetaminophen (ROXICET) 5-325 MG tablet Take 1 tablet by mouth every 6 (six) hours as needed for severe pain. 20 tablet 0  . tiZANidine (ZANAFLEX) 4 MG tablet Take 1 tablet (4 mg total) by mouth every 6 (six) hours as needed for muscle spasms. 30 tablet 0  . traMADol (ULTRAM) 50 MG tablet Take 1 tablet (50 mg total) by mouth every  12 (twelve) hours as needed. 30 tablet 0   No facility-administered medications prior to visit.     ROS Review of Systems  Constitutional: Negative for appetite change, diaphoresis and fatigue.  HENT: Negative.  Negative for trouble swallowing.   Eyes: Negative.   Respiratory: Negative.  Negative for cough, chest tightness, shortness of breath and wheezing.   Cardiovascular: Negative for chest pain, palpitations and leg swelling.  Gastrointestinal: Negative for abdominal pain, constipation, diarrhea, nausea and vomiting.  Endocrine: Negative.   Genitourinary: Negative.  Negative for decreased urine volume and difficulty urinating.  Musculoskeletal: Positive for neck pain. Negative for arthralgias, back pain and neck stiffness.  Skin: Negative for color change and rash.  Allergic/Immunologic: Negative.   Neurological: Positive for weakness. Negative for dizziness, light-headedness and numbness.  Hematological: Negative for adenopathy. Does not bruise/bleed easily.  Psychiatric/Behavioral: Positive for decreased concentration. Negative for confusion, dysphoric mood, self-injury and suicidal ideas. The patient is not nervous/anxious.     Objective:  BP (!) 150/88 (BP Location: Left Arm, Patient Position: Sitting, Cuff Size: Large)   Pulse 77   Temp 98.1 F (36.7 C) (Oral)   Resp 16   Ht 5\' 9"  (1.753 m)   Wt 242 lb 4 oz (109.9 kg)   SpO2 99%   BMI 35.77 kg/m   BP Readings from Last 3 Encounters:  01/24/17 134/80  01/24/17 (!) 150/88  01/20/17 126/74    Wt Readings from Last 3 Encounters:  01/24/17 241 lb (109.3 kg)  01/24/17 242 lb 4 oz (109.9 kg)  01/20/17 249 lb (112.9 kg)    Physical Exam  Constitutional: He is oriented to person, place, and time. No distress.  HENT:  Mouth/Throat: Oropharynx is clear and moist. No oropharyngeal exudate.  Eyes: Conjunctivae are normal. Right eye exhibits no discharge. Left eye exhibits no discharge. No scleral icterus.  Neck: Normal  range of motion. Neck supple. No JVD present. No thyromegaly present.  Cardiovascular: Normal rate, regular rhythm and intact distal pulses.  Exam reveals no gallop and no friction rub.   No murmur heard. Pulmonary/Chest: Effort normal and breath sounds normal. No respiratory distress. He has no wheezes. He has no rales. He exhibits no tenderness.  Abdominal: Soft. Bowel sounds are normal. He exhibits no distension and no mass. There is no tenderness. There is no rebound and no guarding.  Musculoskeletal: Normal range of motion. He exhibits no edema, tenderness or deformity.  Lymphadenopathy:    He has no cervical adenopathy.  Neurological: He is alert and oriented to person, place, and time.  Skin:  Skin is warm and dry. No rash noted. He is not diaphoretic. No erythema. No pallor.  Vitals reviewed.   Lab Results  Component Value Date   WBC 8.5 12/02/2016   HGB 13.6 12/02/2016   HCT 41.1 12/02/2016   PLT 231.0 12/02/2016   GLUCOSE 235 (H) 12/02/2016   CHOL 152 12/02/2016   TRIG 290.0 (H) 12/02/2016   HDL 40.00 12/02/2016   LDLDIRECT 87.0 12/02/2016   LDLCALC 93 02/20/2016   ALT 23 12/02/2016   AST 14 12/02/2016   NA 136 12/02/2016   K 3.9 12/02/2016   CL 103 12/02/2016   CREATININE 0.85 12/02/2016   BUN 14 12/02/2016   CO2 26 12/02/2016   TSH 3.01 03/01/2016   PSA 0.41 03/01/2016   HGBA1C 9.7 (H) 12/02/2016   MICROALBUR <0.7 03/01/2016    Dg Inject Diag/thera/inc Needle/cath/plc Epi/cerv/thor W/img  Result Date: 01/12/2017 CLINICAL DATA:  Cervical spondylosis without myelopathy. Symptomatic disc herniation at C5-6 on the RIGHT. FLUOROSCOPY TIME:  22 seconds corresponding to a Dose Area Product of 29.33 Gy*m2 PROCEDURE: Informed written consent was obtained.  Time-out was performed. An appropriate skin entry site was chosen, cleansed with Betadine, and anesthetized with 1% lidocaine. CERVICAL EPIDURAL INJECTION An interlaminar approach was performed on the RIGHT at C7-T1 . A  20 gauge epidural needle was advanced using loss-of-resistance technique. DIAGNOSTIC EPIDURAL INJECTION Injection of Isovue-M 300 shows a good epidural pattern with spread above and below the level of needle placement, primarily on the RIGHT. No vascular opacification is seen. THERAPEUTIC EPIDURAL INJECTION 1.5 ml of Kenalog 40 mixed with 1 ml of 1% Lidocaine and 2 ml of normal saline were then instilled. The procedure was well-tolerated, and the patient was discharged thirty minutes following the injection in good condition. IMPRESSION: Technically successful first epidural injection on the RIGHT at C7-T1. Electronically Signed   By: Staci Righter M.D.   On: 01/12/2017 11:35    Assessment & Plan:   Zachary Graham was seen today for adhd.  Diagnoses and all orders for this visit:  Herniation of cervical intervertebral disc with radiculopathy- I encouraged him to pursue surgical intervention to reduce the pain and to prevent further neurological decline  ADHD (attention deficit hyperactivity disorder), inattentive type -     Discontinue: amphetamine-dextroamphetamine (ADDERALL) 10 MG tablet; Take 1 tablet (10 mg total) by mouth 2 (two) times daily. -     Discontinue: amphetamine-dextroamphetamine (ADDERALL) 5 MG tablet; Take 1 tablet (5 mg total) by mouth daily. -     Discontinue: amphetamine-dextroamphetamine (ADDERALL) 10 MG tablet; Take 1 tablet (10 mg total) by mouth 2 (two) times daily. -     Discontinue: amphetamine-dextroamphetamine (ADDERALL) 5 MG tablet; Take 1 tablet (5 mg total) by mouth daily. -     amphetamine-dextroamphetamine (ADDERALL) 10 MG tablet; Take 1 tablet (10 mg total) by mouth 2 (two) times daily. -     amphetamine-dextroamphetamine (ADDERALL) 5 MG tablet; Take 1 tablet (5 mg total) by mouth daily.   I have discontinued Mr. Afzal naproxen, etodolac, baclofen, diclofenac sodium, diazepam, meloxicam, traMADol, oxyCODONE-acetaminophen, and tiZANidine. I am also having him maintain  his PARADIGM RESERVOIR 3ML, INSULIN SYRINGE 1CC/29G, glucagon, simvastatin, Melatonin, Turmeric Curcumin, Cholecalciferol (VITAMIN D-3 PO), Calcium-Magnesium-Vitamin D (CALCIUM 500 PO), DHEA, vitamin C, pantoprazole, Alpha-Lipoic Acid, b complex vitamins, glucose blood, freestyle, losartan, QNASL, FREESTYLE LITE, insulin aspart, DULoxetine, metFORMIN, Biotin, amphetamine-dextroamphetamine, and amphetamine-dextroamphetamine.  Meds ordered this encounter  Medications  . Biotin 10000 MCG TABS    Sig: Take by  mouth.  . DISCONTD: amphetamine-dextroamphetamine (ADDERALL) 10 MG tablet    Sig: Take 1 tablet (10 mg total) by mouth 2 (two) times daily.    Dispense:  60 tablet    Refill:  0  . DISCONTD: amphetamine-dextroamphetamine (ADDERALL) 5 MG tablet    Sig: Take 1 tablet (5 mg total) by mouth daily.    Dispense:  30 tablet    Refill:  0  . DISCONTD: amphetamine-dextroamphetamine (ADDERALL) 10 MG tablet    Sig: Take 1 tablet (10 mg total) by mouth 2 (two) times daily.    Dispense:  60 tablet    Refill:  0  . DISCONTD: amphetamine-dextroamphetamine (ADDERALL) 5 MG tablet    Sig: Take 1 tablet (5 mg total) by mouth daily.    Dispense:  30 tablet    Refill:  0  . amphetamine-dextroamphetamine (ADDERALL) 10 MG tablet    Sig: Take 1 tablet (10 mg total) by mouth 2 (two) times daily.    Dispense:  60 tablet    Refill:  0  . amphetamine-dextroamphetamine (ADDERALL) 5 MG tablet    Sig: Take 1 tablet (5 mg total) by mouth daily.    Dispense:  30 tablet    Refill:  0     Follow-up: Return if symptoms worsen or fail to improve.  Scarlette Calico, MD

## 2017-01-24 NOTE — Therapy (Signed)
[facility] [address] [place], [place], [address] Phone: [phone]   Fax:  [phone]  Physical Therapy Evaluation  Patient Details  Name: Zachary Graham MRN: [id number] Date of Birth: 08/30/1960 Referring Provider: [name]  Encounter Date: 01/24/2017      PT End of Session - 01/24/17 1338    Visit Number 1   Number of Visits 12   Date for PT Re-Evaluation 03/07/17   PT Start Time 1147   PT Stop Time 1233   PT Time Calculation (min) 46 min   Activity Tolerance Patient tolerated treatment well   Behavior During Therapy [facility] for tasks assessed/performed      Past Medical History:  Diagnosis Date  . Diabetes mellitus   . GERD (gastroesophageal reflux disease)   . Hyperlipidemia   . Hypertension   . OSA (obstructive sleep apnea)     Past Surgical History:  Procedure Laterality Date  . TONSILLECTOMY      There were no vitals filed for this visit.       Subjective Assessment - 01/24/17 1146    Pertinent History  Pt states increased pain in neck/shoulder and shoulder blade on R.  She has had significant relief from recent epidural injection. He has most pain in R shoulder, shoulder blade. Pt has had + MRI for nerve compression from c5-c6 compression. 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 He does do a lot of work at [employer].    Limitations Standing;Walking;Sitting   Patient Stated Goals Decreased pain , increased strength and movement of UE   Currently in Pain? Yes   Pain Score 3    Aggravating Factors  sitting, standing, walking    Pain Relieving Factors Laying flat            [facility] PT Assessment - 01/24/17 0001      Assessment   Medical Diagnosis Cervical Radiculopathy   Referring Provider [name]   Hand Dominance Right   Next MD Visit Tamala Julian: 10/4    Prior Therapy no     Precautions   Precautions None      Balance Screen   Has the patient fallen in the past 6 months No     Prior Function   Level of Independence Independent     ROM / Strength   AROM / PROM / Strength AROM     AROM   AROM Assessment Site Cervical;Other (comment);Shoulder   Right/Left Shoulder Right   Right Shoulder Flexion 130 Degrees   Right Shoulder ABduction 130 Degrees   Cervical Flexion moderate limitation   Cervical Extension moderate/severe  limitation   Cervical - Right Side Bend moderate limitation   Cervical - Left Side Bend moderate limitation   Cervical - Right Rotation moderate limitation   Cervical - Left Rotation moderate limitation     Palpation   Palpation comment Pain in R upper trap, R cervical paraspinals, significant muscle guarding with attempts for PROM testing and joint testing.      Special Tests    Special Tests Cervical   Cervical Tests Dictraction     Distraction Test   Findngs --  No increased pain, pt did not state relief of pain however            Objective measurements completed on examination: See above findings.          [facility] Adult PT Treatment/Exercise - 01/24/17  0001      Neck Exercises: Seated   Cervical Rotation 10 reps     Neck Exercises: Supine   Other Supine Exercise Pec stretch with nerve glide     Manual Therapy   Manual Therapy Joint mobilization;Soft tissue mobilization;Manual Traction;Passive ROM   Passive ROM Cervical PROM all motions, manual UT stretch   Manual Traction 10 sec x10                PT Education - 01/24/17 1337    Education provided Yes   Education Details Initial HEP, recommendations for movement, heat/ice, and posture    Person(s) Educated Patient   Methods Explanation;Handout;Tactile cues   Comprehension Verbalized understanding;Need further instruction          PT Short Term Goals - 01/24/17 1243      PT SHORT TERM GOAL #1   Title Pt to demo decreased pain in neck and shoulder to 2-3/10 with activity     Time 2   Period Weeks   Status New   Target Date 02/07/17           PT Long Term Goals - 01/24/17 1244      PT LONG TERM GOAL #1   Title Pt to demo improved cervical ROm, to be WNL, to improve ability for head turns while driving   Time 6   Period Weeks   Status New   Target Date 03/07/17     PT LONG TERM GOAL #2   Title Pt to demo decreased pain in cervical and shoulder region to 0-1/10 with activity, to improve ability for work duties   Time 6   Period Weeks   Status New   Target Date 03/07/17     PT LONG TERM GOAL #3   Title Pt to demo ability to self correct posture, in clinic, at least 75 % of the time    Time 6   Period Weeks   Status New   Target Date 03/07/17                Plan - 01/24/17 1339    Clinical Impression Statement Pt presents wtih primary complaint of increased pain in cervical region, and into R UE. He states pain deep into R shoulder and deltoid region, but denies any numbness/tingling sensations. He states that he feels mild weakness in R UE, he has 40/55lb grip strength on R vs L. ( R hand dominant). Pt with significant muscle guarding in cervical region, with stiff upper body posture.  He has poor posture in seated and standing., with fwd head posture, unable to achive neutral spine alignment today, due to muscle guarding and pain. Pt to benefit form skilled care, to improve cervical pain, ROM, UE strength, and overall improvement of functional use of R UE.    Clinical Presentation Stable   Clinical Decision Making Low   Rehab Potential Good   PT Frequency 2x / week   PT Duration 6 weeks   PT Treatment/Interventions ADLs/Self Care Home Management;Cryotherapy;Electrical Stimulation;Moist Heat;Traction;Ultrasound;Neuromuscular re-education;Therapeutic exercise;Patient/family education;Manual techniques;Taping;Dry needling   PT Next Visit Plan Progress/educate on HEP for posture, cervical stretching, and cervical ROM, retraction, distraction,     PT Home Exercise Plan Cervical rotation; supine pec stretch with nerve glide.    Consulted and Agree with Plan of Care Patient      Patient will benefit from skilled therapeutic intervention in order to improve the following deficits and impairments:  Hypomobility, Decreased strength, Impaired UE functional  use, Pain, Increased muscle spasms, Improper body mechanics, Impaired flexibility  Visit Diagnosis: Cervicalgia - Plan: PT plan of care cert/re-cert  Radiculopathy, cervical region - Plan: PT plan of care cert/re-cert     Problem List Patient Active Problem List   Diagnosis Date Noted  . 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 ([place]) 01/06/2017  . Cervical radiculopathy at C8 01/04/2017  . Current mild episode of major depressive disorder without prior episode ([place]) 12/02/2016  . Chest tightness 12/02/2016  . Diabetic neuropathy ([place]) 03/01/2016  . Hammer toe, acquired 06/19/2015  . Obesity, Class II, BMI 35.0-39.9, with comorbidity (see actual BMI) 04/24/2015  . Type 1 diabetes mellitus with hyperglycemia, with long-term current use of insulin ([place]) 04/04/2015  . Patellofemoral stress syndrome of left knee 03/19/2015  . Allergic rhinitis due to pollen 11/14/2014  . Equinus deformity of foot, acquired 05/29/2014  . ADHD (attention deficit hyperactivity disorder), inattentive type 10/24/2013  . Vitamin D deficiency 02/28/2013  . Pulmonic stenosis 05/25/2012  . Pure hyperglyceridemia 05/25/2012  . Hyperlipidemia with target LDL less than 100 09/20/2011  . OSA (obstructive sleep apnea) 09/20/2011  . BPH (benign prostatic hyperplasia) 09/20/2011  . Routine general medical examination at a health care facility 09/20/2011   Lyndee Hensen, PT, DPT 2:09 PM  01/24/17    [place], [place], [address] Phone: [phone]   Fax:  [phone]  Name: Zachary Graham MRN: [id number] Date of Birth: Jul 02, 1960

## 2017-01-24 NOTE — Patient Instructions (Addendum)
Please continue: - Basal rates: 12 AM to 2 AM: 3.0 8 AM: 3.10 12 PM: 2.2 4 PM: 2.8 10:30 PM: 2.7 - bolus: - insulin to carb ratio:  12 AM: 1.5 11 AM: 2.0 5 PM: 1.5  - ISF: 10 - target: 100-120 - IOB: 3.0   Please attach the CGM as soon as possible.   Please come back for a follow-up appointment in 3 months.

## 2017-01-24 NOTE — Progress Notes (Signed)
Patient ID: Zachary Graham, male   DOB: July 03, 1960, 56 y.o.   MRN: [id number]  HPI: Zachary Graham is a 56 y.o.-year-old male, returning for f/u for DM1, dx 1993, insulin-dependent, uncontrolled, with complications (mild sensory peripheral neuropathy; hypoglycemia episodes, DR). Last visit 8 mo ago.  He has a herniated disk in neck >> had an epidural. He also had a steroid inj. May have surgery.  However, due to his neck and shoulder pain, his study to go to bed earlier in the last month and not to eat through the night. His sugars are better at this visit due to this.  Pt was on an insulin pump since 2006, now on Medtronic 670G. Has Novolog in pump. He loves the pump but does have problems with the sensor.   Last hemoglobin A1c was: Lab Results  Component Value Date   HGBA1C 9.7 (H) 12/02/2016   HGBA1C 7.3 04/22/2016   HGBA1C 7.8 02/17/2016  04/05/2014: HbA1c 7.3%  Now on: - Pump settings: - Basal rates: 12 AM to 2 AM: 3.0 8 AM: 3.10 12 PM: 2.2 4 PM: 2.8 10:30 PM: 2.7 - bolus: - insulin to carb ratio:  12 AM: 1.5 11 AM: 2.0 5 PM: 1.5  - ISF: 10 - target: 100-120 - IOB: 4h >> 3.0  TDD basal: 24% >> 29% >> 38% TDD bolus: 76% >> 71% >> 62% TDD 192 +/- 32 units >> 178  - Bolus wizard: On - Modified bolusing: no - Changes the pump site: every 1.6 days  Pt checks his sugars 4.8x a day-  (will scan the pump + CGM downloads) >> ave: 211 +/- 80 >> 118 +/- 23 (CGM). - am: 81-295 >> 101-336 >> 168-287 >> 84-239, 265 - 8-9 am: n/c >> 113-264 >> 75, 234-269 >> 83, 114-144, 184 - noon: 157-190 >> 89-228 >> 118-204 >> 78-195, 227 - before dinner:  133-242 >> 139-276, 381 >> [address], 192, 248 - after dinner: 126-252 >> 71, 160-267, 355 >> 82-209 - bedtime (12 am):  62-181 >> 130-250 >> see above >> 1x >400 (site pb), 133-197, 274 - 2 am: 1 144-296, 354 >> 117-310 >> see above - 4 am:79-234, 369 >> 147-233, 301 >> see above >> 123, 206 >> 189 >> see am  He has a history of severe  hypoglycemia episodes in 2015. He never had DKA.  - No CKD, last BUN/creatinine:  Lab Results  Component Value Date   BUN 14 12/02/2016   CREATININE 0.85 12/02/2016  On Cozaar. - last set of lipids: Lab Results  Component Value Date   CHOL 152 12/02/2016   HDL 40.00 12/02/2016   LDLCALC 93 02/20/2016   LDLDIRECT 87.0 12/02/2016   TRIG 290.0 (H) 12/02/2016   CHOLHDL 4 12/02/2016  On Zocor. - last eye exam was in 12/2016 >> + DR. Letta [facility].  - he has numbness and tingling in his feet. This has improved on alpha lipoic acid + B complex. He started Cymbalta >> more aching.   He also has a history of GERD, hypertension, hyperlipidemia, OSA.   Lab Results  Component Value Date   TSH 3.01 03/01/2016   ROS: Constitutional: no weight gain/no weight loss, no fatigue, no subjective hyperthermia, no subjective hypothermia Eyes: no blurry vision, no xerophthalmia ENT: no sore throat, no nodules palpated in throat, no dysphagia, no odynophagia, no hoarseness Cardiovascular: no CP/no SOB/no palpitations/no leg swelling Respiratory: no cough/no SOB/no wheezing Gastrointestinal: no N/no V/no D/no C/no acid reflux Musculoskeletal: +  muscle aches/+ joint aches Skin: no rashes, no hair loss Neurological: no tremors/no dizziness  I reviewed pt's medications, allergies, PMH, social hx, family hx, and changes were documented in the history of present illness. Otherwise, unchanged from my initial visit note.  PE: BP 134/80 (BP Location: Left Arm, Patient Position: Sitting)   Pulse 80   Wt 241 lb (109.3 kg)   SpO2 96%   BMI 35.59 kg/m  Body mass index is 35.59 kg/m.  Wt Readings from Last 3 Encounters:  01/24/17 241 lb (109.3 kg)  01/24/17 242 lb 4 oz (109.9 kg)  01/20/17 249 lb (112.9 kg)   Constitutional: overweight, in NAD Eyes: PERRLA, EOMI, no exophthalmos ENT: moist mucous membranes, no thyromegaly, no cervical lymphadenopathy Cardiovascular: RRR, No MRG Respiratory:  CTA B Gastrointestinal: abdomen soft, NT, ND, BS+ Musculoskeletal: no deformities, strength intact in all 4 Skin: moist, warm, no rashes Neurological: no tremor with outstretched hands, DTR normal in all 4  ASSESSMENT: 1. DM1, insulin-dependent, uncontrolled, without complications - DR - PN  - on pump - On CGM - Dexcom. He had an Enlite before, which found less accurate >> retried this Summer 2017 >> still poor accuracy.  - Barriers to good control:  Very busy schedule >> less time to check sugars and eat but But he does a great job with bolusing during the day >> still has a lot of large boluses during the day, sometimes without documented carbs or sugars  Lack of sleep >> usually sleeps max 5h a night (!) and tries to catch up in the weekend  Reaches home late at night  - sometimes at 1 am >> eats >65% of the daily calories then  2. 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 Patient with long-standing type 1 diabetes with very high insulin resistance, on high doses of insulin through insulin pump. Since last visit he change to the latest Medtronic insulin pump, 670 G and he is struggling now to attach the CGM. He had it on for a few days and his sugars were wonderful at that time, 100% in the normal range. He is very interested to get back in the automatic mode. - In the last 2 weeks his sugars have improved quite significantly and I believe that this is due to his neck and shoulder pain that did not allow him to stay late at night and work and also eat frequently throughout the night. I strongly advised him not to get back to the previous practice of only sleeping a few hours a night. - At this visit, reviewing his sugars at home, I do not feel that we need to make any changes in his insulin doses. He is still getting less insulin from basal rates then from boluses, but the proportion is better than at last visit. - He is continuing metformin, Which she restarted  recently, and I believe that this also plays a role in improvement of his sugars. - We reviewed together his last visit he A1c which was higher, at 9.7%, however, since then, it is clear that his sugars did improve. - I advised him to:  Patient Instructions   Patient Instructions  Please continue: - Basal rates: 12 AM to 2 AM: 3.0 8 AM: 3.10 12 PM: 2.2 4 PM: 2.8 10:30 PM: 2.7 - bolus: - insulin to carb ratio:  12 AM: 1.5 11 AM: 2.0 5 PM: 1.5  - ISF: 10 - target: 100-120 - IOB: 3.0  Please attach the CGM as soon as possible.   Please come back for a follow-up appointment in 3 months.  - continue checking sugars at different times of the day - check 4-8x a day, rotating checks - advised for yearly eye exams >> he is UTD - Return to clinic in 3 mo with sugar log   2 Diabetic neuropathy -  numbness and tingling in his feet started after he took gabapentin by mistake, instead of metformin. We tapered it off and added alpha lipoic acid and B complex. The numbness and tingling decreased in intensity but it is even better after he started Cymbalta, but he feels that Cymbalta numbs the tingling but causes more aching. He is not sure whether this is coming from spending more recently but we discussed that it is unusual for Cymbalta to cause pain.  - time spent with the patient: 40 min, of which >50% was spent in reviewing his pump and CGM downloads, discussing his hyper hyper-glycemic episodes, reviewing previous labs and pump settings and developing a plan to avoid hypo- and hyper-glycemia. We also discussed about his PN.  Philemon Kingdom, MD PhD [facility] End[facility]

## 2017-01-26 ENCOUNTER — Encounter: Payer: Self-pay | Admitting: Internal Medicine

## 2017-01-27 ENCOUNTER — Ambulatory Visit (INDEPENDENT_AMBULATORY_CARE_PROVIDER_SITE_OTHER): Payer: 59 | Admitting: Physical Therapy

## 2017-01-27 ENCOUNTER — Encounter: Payer: Self-pay | Admitting: Physical Therapy

## 2017-01-27 DIAGNOSIS — Z981 Arthrodesis status: Secondary | ICD-10-CM | POA: Diagnosis not present

## 2017-01-27 DIAGNOSIS — M542 Cervicalgia: Secondary | ICD-10-CM | POA: Diagnosis not present

## 2017-01-27 DIAGNOSIS — M5412 Radiculopathy, cervical region: Secondary | ICD-10-CM | POA: Diagnosis not present

## 2017-01-27 NOTE — Therapy (Signed)
[facility] [facility] [address] [place], [place], [address] Phone: [phone]   Fax:  [phone]  Physical Therapy Treatment  Patient Details  Name: Zachary Graham MRN: [id number] Date of Birth: [birth year] Referring Provider: [name]  Encounter Date: 01/27/2017      PT End of Session - 01/27/17 1652    Visit Number 2   Number of Visits 12   Date for PT Re-Evaluation 03/07/17   PT Start Time 0355   PT Stop Time 1656   PT Time Calculation (min) 61 min   Activity Tolerance Patient tolerated treatment well   Behavior During Therapy [facility] for tasks assessed/performed      Past Medical History:  Diagnosis Date  . Diabetes mellitus   . GERD (gastroesophageal reflux disease)   . Hyperlipidemia   . Hypertension   . OSA (obstructive sleep apnea)     Past Surgical History:  Procedure Laterality Date  . TONSILLECTOMY      There were no vitals filed for this visit.      Subjective Assessment - 01/27/17 1651    Subjective Pt states "stiffness" today. He has very stiff upper body posture when getting up from chair and walking into PT gym.    Currently in Pain? Yes   Pain Score 4                          [facility] Adult PT Treatment/Exercise - 01/27/17 1539      Neck Exercises: Seated   Cervical Rotation 20 reps   Shoulder Rolls 20 reps   Other Seated Exercise Scap squeezes x20     Neck Exercises: Supine   Neck Retraction 20 reps   Neck Retraction Limitations with manual assist   Other Supine Exercise Pec stretch with nerve glide     Modalities   Modalities Moist Heat     Moist Heat Therapy   Number Minutes Moist Heat 10 Minutes   Moist Heat Location Cervical     Manual Therapy   Manual Therapy Joint mobilization;Soft tissue mobilization;Manual Traction;Passive ROM   Passive ROM Cervical PROM all motions, manual UT stretch   Manual Traction 10 sec x10     Neck Exercises: Stretches   Corner Stretch 3  reps;30 seconds  Doorway                PT Education - 01/27/17 1652    Education Details Handout for HEP   Person(s) Educated Patient   Methods Explanation;Handout   Comprehension Verbalized understanding;Need further instruction          PT Short Term Goals - 01/24/17 1243      PT SHORT TERM GOAL #1   Title Pt to demo decreased pain in neck and shoulder to 2-3/10 with activity    Time 2   Period Weeks   Status New   Target Date 02/07/17           PT Long Term Goals - 01/24/17 1244      PT LONG TERM GOAL #1   Title Pt to demo improved cervical ROm, to be WNL, to improve ability for head turns while driving   Time 6   Period Weeks   Status New   Target Date 03/07/17     PT LONG TERM GOAL #2   Title Pt to demo decreased pain in cervical and shoulder region to 0-1/10 with activity, to improve ability for work duties  Time 6   Period Weeks   Status New   Target Date 03/07/17     PT LONG TERM GOAL #3   Title Pt to demo ability to self correct posture, in clinic, at least 75 % of the time    Time 6   Period Weeks   Status New   Target Date 03/07/17               Plan - 01/27/17 1654    Clinical Impression Statement Pt with significant muscle guarding with PROM and manual neck work. He has increased soreness and stretch with pec stretch and nerve glide. Manual therapy done for this and for cervical distraction. Pt to benefit from conitnued education and practice with postural exercises and strengthening.    PT Treatment/Interventions ADLs/Self Care Home Management;Cryotherapy;Electrical Stimulation;Moist Heat;Traction;Ultrasound;Neuromuscular re-education;Therapeutic exercise;Patient/family education;Manual techniques;Taping;Dry needling   PT Home Exercise Plan Cervical rotation; supine pec stretch with nerve glide;  supine retraction;  doorway stretch       Patient will benefit from skilled therapeutic intervention in order to improve the  following deficits and impairments:  Hypomobility, Decreased strength, Impaired UE functional use, Pain, Increased muscle spasms, Improper body mechanics, Impaired flexibility  Visit Diagnosis: Cervicalgia  Radiculopathy, cervical region     Problem List Patient Active Problem List   Diagnosis Date Noted  . 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 ([place]) 01/06/2017  . Herniation of cervical intervertebral disc with radiculopathy 01/04/2017  . Current mild episode of major depressive disorder without prior episode ([place]) 12/02/2016  . Chest tightness 12/02/2016  . Diabetic neuropathy ([place]) 03/01/2016  . Hammer toe, acquired 06/19/2015  . Obesity, Class II, BMI 35.0-39.9, with comorbidity (see actual BMI) 04/24/2015  . Type 1 diabetes mellitus with hyperglycemia, with long-term current use of insulin ([place]) 04/04/2015  . Patellofemoral stress syndrome of left knee 03/19/2015  . Allergic rhinitis due to pollen 11/14/2014  . Equinus deformity of foot, acquired 05/29/2014  . ADHD (attention deficit hyperactivity disorder), inattentive type 10/24/2013  . Vitamin D deficiency 02/28/2013  . Pulmonic stenosis 05/25/2012  . Pure hyperglyceridemia 05/25/2012  . Hyperlipidemia with target LDL less than 100 09/20/2011  . OSA (obstructive sleep apnea) 09/20/2011  . BPH (benign prostatic hyperplasia) 09/20/2011  . Routine general medical examination at a health care facility 09/20/2011   Lyndee Hensen, PT, DPT 5:02 PM  01/27/17    [place], [place], [address] Phone: [phone]   Fax:  [phone]  Name: Zachary Graham MRN: [id number] Date of Birth: [birth year]

## 2017-01-31 ENCOUNTER — Encounter: Payer: Self-pay | Admitting: Podiatry

## 2017-01-31 ENCOUNTER — Ambulatory Visit (INDEPENDENT_AMBULATORY_CARE_PROVIDER_SITE_OTHER): Payer: 59

## 2017-01-31 ENCOUNTER — Ambulatory Visit (INDEPENDENT_AMBULATORY_CARE_PROVIDER_SITE_OTHER): Payer: 59 | Admitting: Podiatry

## 2017-01-31 VITALS — BP 123/89 | HR 83

## 2017-01-31 DIAGNOSIS — E1142 Type 2 diabetes mellitus with diabetic polyneuropathy: Secondary | ICD-10-CM | POA: Diagnosis not present

## 2017-01-31 DIAGNOSIS — M79671 Pain in right foot: Secondary | ICD-10-CM | POA: Diagnosis not present

## 2017-01-31 DIAGNOSIS — M79672 Pain in left foot: Secondary | ICD-10-CM

## 2017-01-31 DIAGNOSIS — M722 Plantar fascial fibromatosis: Secondary | ICD-10-CM | POA: Diagnosis not present

## 2017-01-31 DIAGNOSIS — R52 Pain, unspecified: Secondary | ICD-10-CM

## 2017-01-31 NOTE — Patient Instructions (Signed)
Today her diabetic foot screen demonstrated adequate circulation and feeling in your feet. The x-rays demonstrate no acute bony problems If problems seems most consistent with neuropathy and muscular spasms as well as plantar fasciitis Talk with your prescribing doctor about Cymbalta before you discontinue this medication Return the symptoms worsen over time    Diabetes and Foot Care Diabetes may cause you to have problems because of poor blood supply (circulation) to your feet and legs. This may cause the skin on your feet to become thinner, break easier, and heal more slowly. Your skin may become dry, and the skin may peel and crack. You may also have nerve damage in your legs and feet causing decreased feeling in them. You may not notice minor injuries to your feet that could lead to infections or more serious problems. Taking care of your feet is one of the most important things you can do for yourself. Follow these instructions at home:  Wear shoes at all times, even in the house. Do not go barefoot. Bare feet are easily injured.  Check your feet daily for blisters, cuts, and redness. If you cannot see the bottom of your feet, use a mirror or ask someone for help.  Wash your feet with warm water (do not use hot water) and mild soap. Then pat your feet and the areas between your toes until they are completely dry. Do not soak your feet as this can dry your skin.  Apply a moisturizing lotion or petroleum jelly (that does not contain alcohol and is unscented) to the skin on your feet and to dry, brittle toenails. Do not apply lotion between your toes.  Trim your toenails straight across. Do not dig under them or around the cuticle. File the edges of your nails with an emery board or nail file.  Do not cut corns or calluses or try to remove them with medicine.  Wear clean socks or stockings every day. Make sure they are not too tight. Do not wear knee-high stockings since they may decrease  blood flow to your legs.  Wear shoes that fit properly and have enough cushioning. To break in new shoes, wear them for just a few hours a day. This prevents you from injuring your feet. Always look in your shoes before you put them on to be sure there are no objects inside.  Do not cross your legs. This may decrease the blood flow to your feet.  If you find a minor scrape, cut, or break in the skin on your feet, keep it and the skin around it clean and dry. These areas may be cleansed with mild soap and water. Do not cleanse the area with peroxide, alcohol, or iodine.  When you remove an adhesive bandage, be sure not to damage the skin around it.  If you have a wound, look at it several times a day to make sure it is healing.  Do not use heating pads or hot water bottles. They may burn your skin. If you have lost feeling in your feet or legs, you may not know it is happening until it is too late.  Make sure your health care provider performs a complete foot exam at least annually or more often if you have foot problems. Report any cuts, sores, or bruises to your health care provider immediately. Contact a health care provider if:  You have an injury that is not healing.  You have cuts or breaks in the skin.  You have  an ingrown nail.  You notice redness on your legs or feet.  You feel burning or tingling in your legs or feet.  You have pain or cramps in your legs and feet.  Your legs or feet are numb.  Your feet always feel cold. Get help right away if:  There is increasing redness, swelling, or pain in or around a wound.  There is a red line that goes up your leg.  Pus is coming from a wound.  You develop a fever or as directed by your health care provider.  You notice a bad smell coming from an ulcer or wound. This information is not intended to replace advice given to you by your health care provider. Make sure you discuss any questions you have with your health care  provider. Document Released: 04/30/2000 Document Revised: 10/09/2015 Document Reviewed: 10/10/2012 Elsevier Interactive Patient Education  2017 [employer].

## 2017-01-31 NOTE — Progress Notes (Signed)
   Subjective:    Patient ID: Zachary Graham, male    DOB: 04-16-1961, 56 y.o.   MRN: [id number]  HPI  This patient presents today with approximately 5 day history of a severe bilateral cramping, aching on and off weightbearing on the right and left feet. The pain is primarily focused in the plantar aspect of the feet. Pain is is gradually improving with relative rest. Patient states that weightbearing tends reduce some of the discomfort is able sleep at night. Patient is on Cymbalta for cervical radiculopathy and discontinue the medication himself because of a paradoxical response to gabapentin. Is unsure whether discontinuing the medication had any influence on the gradual reducing discomfort he said and bilateral foot pain. He does not recall previous history of this problem  Patient is a diabetic for approximately 26 years He denies any history of foot ulceration, claudication or amputation He denies smoking history He describes a history of taking gabapentin inadvertently and having neuropathic-like pain exacerbated and discontinued when he stopped the medication. Patient is under treatment for herniated cervical disc   Review of Systems  Musculoskeletal: Positive for back pain.       Joint pain  Neurological: Positive for numbness.       Objective:   Physical Exam  Patient pleasant orientated 3  He estimated his weight at approximately 240 pounds sennoside approximate 5 foot 10  Vascular: No peripheral edema noted bilaterally No calf tenderness bilaterally DP and PT pulses 2/4 bilaterally Capillary reflex within normal limits bilaterally  Neurological: Sensation to 10 g monofilament wire intact 8/8 bilaterally Vibratory sensation reactive bilaterally Ankle reflex equal reactive bilaterally  Dermatological: No open skin lesions bilaterally Texture and turgor appears normal limits Light hair growth bilaterally  Musculoskeletal: Hammertoe second left Manual motor  testing hip abduction and hip abduction 5/5 bilaterally Knee flexion and knee extension 5/5 bilaterally Ankle flexion and plantar flexion 5/5 bilaterally Foot inversion, eversion 5/5 bilaterally Mild palpable tenderness plantar fascial areas bilaterally without any palpable lesions There is no pain or crepitus on range of motion of ankle, subtalar, midtarsal joints bilaterally  X-ray examination weightbearing left foot dated 01/31/2017 Intact bony structure without fracture and/or dislocation Bone density within normal limits Joint spaces within normal limits Metatarsus adductus Posterior heel spur Hammertoe second Radiographic impression: No acute bony abnormality noted in a weightbearing x-ray of the left foot dated 01/31/2017  X-ray examination weightbearing right foot dated 01/31/2017 Intact bony structure without fracture and/or dislocation Bone density within normal limits Joint space within normal limits Metatarsus adductus No acute bony abnormality noted weightbearing x-ray of the right foot dated 01/31/2017       Assessment & Plan:   Assessment: Diabetic with satisfactory neurovascular status Bilateral plantar fasciitis Resolving symptoms that may be consistent with neuropathy, fasciitis and possible referred pain Pain also may be secondary to altered weightbearing because of patient's complaint of cervical radiculopathy  Plan: Today I reviewed the results of exam an x-ray with patient today. I informed there was no obvious acute problem at this time. The symptoms of pain are resolving. I instructed him to contact the prescribers Cymbalta to see if the prescriber recommended discontinuing Cymbalta. I instructed patient to limit standing walking wearing athletic style shoes and contact the office if the symptoms exacerbate

## 2017-01-31 NOTE — Progress Notes (Signed)
Tried calling patient with No Answer for previsit information.

## 2017-02-01 ENCOUNTER — Encounter: Payer: Self-pay | Admitting: Physical Therapy

## 2017-02-01 ENCOUNTER — Ambulatory Visit (INDEPENDENT_AMBULATORY_CARE_PROVIDER_SITE_OTHER): Payer: 59 | Admitting: Physical Therapy

## 2017-02-01 DIAGNOSIS — M542 Cervicalgia: Secondary | ICD-10-CM

## 2017-02-01 DIAGNOSIS — M5412 Radiculopathy, cervical region: Secondary | ICD-10-CM

## 2017-02-01 NOTE — Therapy (Signed)
[facility] [address] [place], [place], [address] Phone: [phone]   Fax:  [phone]  Physical Therapy Treatment  Patient Details  Name: Zachary Graham MRN: [id number] Date of Birth: 1961-04-16 Referring Provider: [name]  Encounter Date: 02/01/2017      PT End of Session - 02/01/17 1619    Visit Number 3   Number of Visits 12   Date for PT Re-Evaluation 03/07/17   PT Start Time 1521   PT Stop Time 1623   PT Time Calculation (min) 62 min   Activity Tolerance Patient tolerated treatment well   Behavior During Therapy [facility] for tasks assessed/performed      Past Medical History:  Diagnosis Date  . Diabetes mellitus   . GERD (gastroesophageal reflux disease)   . Hyperlipidemia   . Hypertension   . OSA (obstructive sleep apnea)     Past Surgical History:  Procedure Laterality Date  . TONSILLECTOMY      There were no vitals filed for this visit.      Subjective Assessment - 02/01/17 1617    Subjective Pt states that he stopped taking Cymbalta. He has noticed that tingling in his feet has improved. He did see podiatry yesterday, because foot pain had worsened last week. Encouraged pt to follow up with MD that perscribed the medication. Pt states soreness in R upper trap region, with increased use of R UE, work duties, and eating. He has been doing HEP.    Currently in Pain? Yes   Pain Score 4                          [facility] Adult PT Treatment/Exercise - 02/01/17 1517      Neck Exercises: Seated   Neck Retraction 20 reps   Cervical Rotation 20 reps   Shoulder Rolls 20 reps   Other Seated Exercise Scap squeezes x20     Neck Exercises: Supine   Neck Retraction 20 reps   Neck Retraction Limitations with manual assist   Other Supine Exercise Pec stretch with nerve glide     Shoulder Exercises: Supine   Flexion AROM;20 reps     Modalities   Modalities Moist Heat     Moist Heat Therapy   Moist Heat Location Cervical     Manual Therapy   Manual Therapy Joint mobilization;Soft tissue mobilization;Manual Traction;Passive ROM   Joint Mobilization Cervical PA mobs    Passive ROM Cervical PROM all motions, manual UT stretch   Manual Traction 10 sec x10     Neck Exercises: Stretches   Corner Stretch 3 reps;30 seconds  Doorway                PT Education - 02/01/17 1534    Education provided Yes   Education Details On Dx and HEP.    Person(s) Educated Patient   Methods Explanation   Comprehension Verbalized understanding          PT Short Term Goals - 01/24/17 1243      PT SHORT TERM GOAL #1   Title Pt to demo decreased pain in neck and shoulder to 2-3/10 with activity    Time 2   Period Weeks   Status New   Target Date 02/07/17           PT Long Term Goals - 01/24/17 1244      PT LONG TERM GOAL #1   Title Pt to demo improved  cervical ROm, to be WNL, to improve ability for head turns while driving   Time 6   Period Weeks   Status New   Target Date 03/07/17     PT LONG TERM GOAL #2   Title Pt to demo decreased pain in cervical and shoulder region to 0-1/10 with activity, to improve ability for work duties   Time 6   Period Weeks   Status New   Target Date 03/07/17     PT LONG TERM GOAL #3   Title Pt to demo ability to self correct posture, in clinic, at least 75 % of the time    Time 6   Period Weeks   Status New   Target Date 03/07/17               Plan - 02/01/17 1620    Clinical Impression Statement Pt with improved muscle guarding during session today. Pain noted at R upper trap and 1st rib region. He has improved ROM for pec stretch and nerve glide without discomfort today. Manual therapy done for manual distraction , cervical ROM, and pain. Pt to benefit from progression of ther ex and light strengthening once pain is improved. He has follow up with surgeon this week.    PT Treatment/Interventions ADLs/Self Care Home  Management;Cryotherapy;Electrical Stimulation;Moist Heat;Traction;Ultrasound;Neuromuscular re-education;Therapeutic exercise;Patient/family education;Manual techniques;Taping;Dry needling   PT Home Exercise Plan Cervical rotation; supine pec stretch with nerve glide;  supine retraction;  doorway stretch       Patient will benefit from skilled therapeutic intervention in order to improve the following deficits and impairments:  Hypomobility, Decreased strength, Impaired UE functional use, Pain, Increased muscle spasms, Improper body mechanics, Impaired flexibility  Visit Diagnosis: Cervicalgia  Radiculopathy, cervical region     Problem List Patient Active Problem List   Diagnosis Date Noted  . 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 ([place]) 01/06/2017  . Herniation of cervical intervertebral disc with radiculopathy 01/04/2017  . Current mild episode of major depressive disorder without prior episode ([place]) 12/02/2016  . Chest tightness 12/02/2016  . Diabetic neuropathy ([place]) 03/01/2016  . Hammer toe, acquired 06/19/2015  . Obesity, Class II, BMI 35.0-39.9, with comorbidity (see actual BMI) 04/24/2015  . Type 1 diabetes mellitus with hyperglycemia, with long-term current use of insulin ([place]) 04/04/2015  . Patellofemoral stress syndrome of left knee 03/19/2015  . Allergic rhinitis due to pollen 11/14/2014  . Equinus deformity of foot, acquired 05/29/2014  . ADHD (attention deficit hyperactivity disorder), inattentive type 10/24/2013  . Vitamin D deficiency 02/28/2013  . Pulmonic stenosis 05/25/2012  . Pure hyperglyceridemia 05/25/2012  . Hyperlipidemia with target LDL less than 100 09/20/2011  . OSA (obstructive sleep apnea) 09/20/2011  . BPH (benign prostatic hyperplasia) 09/20/2011  . Routine general medical examination at a health care facility 09/20/2011   Lyndee Hensen, PT, DPT 4:24 PM  02/01/17    [facility] [place], [place], [address] Phone: [phone]   Fax:  [phone]  Name: ESLI JERNIGAN MRN: [id number] Date of Birth: [birth year]

## 2017-02-03 ENCOUNTER — Encounter: Payer: Self-pay | Admitting: Physical Therapy

## 2017-02-03 ENCOUNTER — Ambulatory Visit (INDEPENDENT_AMBULATORY_CARE_PROVIDER_SITE_OTHER): Payer: 59 | Admitting: Physical Therapy

## 2017-02-03 DIAGNOSIS — M5412 Radiculopathy, cervical region: Secondary | ICD-10-CM

## 2017-02-03 DIAGNOSIS — M542 Cervicalgia: Secondary | ICD-10-CM

## 2017-02-03 NOTE — Therapy (Signed)
[place] [address] [place], [place], [address] Phone: [phone]   Fax:  [phone]  Physical Therapy Treatment  Patient Details  Name: Zachary Graham MRN: [id number] Date of Birth: 24-Apr-1961 Referring Provider: [name]  Encounter Date: 02/03/2017      PT End of Session - 02/03/17 1656    Visit Number 4   Number of Visits 12   Date for PT Re-Evaluation 03/07/17   PT Start Time 1600   PT Stop Time 1700   PT Time Calculation (min) 60 min   Activity Tolerance Patient tolerated treatment well;Patient limited by pain   Behavior During Therapy [facility] for tasks assessed/performed      Past Medical History:  Diagnosis Date  . Diabetes mellitus   . GERD (gastroesophageal reflux disease)   . Hyperlipidemia   . Hypertension   . OSA (obstructive sleep apnea)     Past Surgical History:  Procedure Laterality Date  . TONSILLECTOMY      There were no vitals filed for this visit.      Subjective Assessment - 02/03/17 1655    Subjective Pt states soreness in shoulder , and stiffness today.    Currently in Pain? Yes   Pain Score 5    Pain Location Shoulder   Pain Orientation Right   Pain Descriptors / Indicators Nagging   Pain Type Acute pain   Pain Onset 1 to 4 weeks ago   Pain Frequency Intermittent                         [facility] Adult PT Treatment/Exercise - 02/03/17 1558      Exercises   Exercises Shoulder     Neck Exercises: Seated   Neck Retraction 20 reps   Cervical Rotation 20 reps   Shoulder Rolls 20 reps   Other Seated Exercise --     Neck Exercises: Supine   Neck Retraction --   Neck Retraction Limitations tried- increased pain   Other Supine Exercise Pec stretch with nerve glide     Shoulder Exercises: Supine   Flexion AROM;20 reps   Flexion Limitations cane     Shoulder Exercises: Standing   Row 20 reps   Theraband Level (Shoulder Row) Level 2 (Red)     Shoulder Exercises:  Pulleys   Flexion 2 minutes     Modalities   Modalities Moist Heat     Moist Heat Therapy   Number Minutes Moist Heat 10 Minutes   Moist Heat Location Cervical     Manual Therapy   Manual Therapy Joint mobilization;Soft tissue mobilization;Manual Traction;Passive ROM   Joint Mobilization side glides   Passive ROM Cervical PROM all motions, manual UT stretch, manual retraction   Manual Traction 10 sec x10     Neck Exercises: Stretches   Corner Stretch 3 reps;30 seconds  Doorway                PT Education - 02/03/17 1656    Education provided Yes   Education Details On continued HEP, and expected duration for improvements with dx.    Person(s) Educated Patient   Methods Explanation   Comprehension Verbalized understanding;Need further instruction          PT Short Term Goals - 01/24/17 1243      PT SHORT TERM GOAL #1   Title Pt to demo decreased pain in neck and shoulder to 2-3/10 with activity    Time 2  Period Weeks   Status New   Target Date 02/07/17           PT Long Term Goals - 01/24/17 1244      PT LONG TERM GOAL #1   Title Pt to demo improved cervical ROm, to be WNL, to improve ability for head turns while driving   Time 6   Period Weeks   Status New   Target Date 03/07/17     PT LONG TERM GOAL #2   Title Pt to demo decreased pain in cervical and shoulder region to 0-1/10 with activity, to improve ability for work duties   Time 6   Period Weeks   Status New   Target Date 03/07/17     PT LONG TERM GOAL #3   Title Pt to demo ability to self correct posture, in clinic, at least 75 % of the time    Time 6   Period Weeks   Status New   Target Date 03/07/17               Plan - 02/03/17 1657    Clinical Impression Statement Pt with stiffness and muscle guarding today, with manual therapy. He has good ability for shoulder ROM and movement, but states that pain stays constant in shoulder region. Pt to benefit from continued  postural work, and manual therapy for neck.    PT Treatment/Interventions ADLs/Self Care Home Management;Cryotherapy;Electrical Stimulation;Moist Heat;Traction;Ultrasound;Neuromuscular re-education;Therapeutic exercise;Patient/family education;Manual techniques;Taping;Dry needling   PT Home Exercise Plan Cervical rotation; supine pec stretch with nerve glide;  supine retraction;  doorway stretch       Patient will benefit from skilled therapeutic intervention in order to improve the following deficits and impairments:  Hypomobility, Decreased strength, Impaired UE functional use, Pain, Increased muscle spasms, Improper body mechanics, Impaired flexibility  Visit Diagnosis: Cervicalgia  Radiculopathy, cervical region     Problem List Patient Active Problem List   Diagnosis Date Noted  . 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 ([place]) 01/06/2017  . Herniation of cervical intervertebral disc with radiculopathy 01/04/2017  . Current mild episode of major depressive disorder without prior episode ([place]) 12/02/2016  . Chest tightness 12/02/2016  . Diabetic neuropathy ([place]) 03/01/2016  . Hammer toe, acquired 06/19/2015  . Obesity, Class II, BMI 35.0-39.9, with comorbidity (see actual BMI) 04/24/2015  . Type 1 diabetes mellitus with hyperglycemia, with long-term current use of insulin ([place]) 04/04/2015  . Patellofemoral stress syndrome of left knee 03/19/2015  . Allergic rhinitis due to pollen 11/14/2014  . Equinus deformity of foot, acquired 05/29/2014  . ADHD (attention deficit hyperactivity disorder), inattentive type 10/24/2013  . Vitamin D deficiency 02/28/2013  . Pulmonic stenosis 05/25/2012  . Pure hyperglyceridemia 05/25/2012  . Hyperlipidemia with target LDL less than 100 09/20/2011  . OSA (obstructive sleep apnea) 09/20/2011  . BPH (benign prostatic hyperplasia) 09/20/2011  . Routine general medical examination at a health care facility  09/20/2011   Lyndee Hensen, PT, DPT 5:01 PM  02/03/17    [facility] [place], [place], [address] Phone: [phone]   Fax:  [phone]  Name: Zachary Graham MRN: [id number] Date of Birth: 05-07-1961

## 2017-02-04 DIAGNOSIS — M4722 Other spondylosis with radiculopathy, cervical region: Secondary | ICD-10-CM | POA: Diagnosis not present

## 2017-02-04 DIAGNOSIS — M5030 Other cervical disc degeneration, unspecified cervical region: Secondary | ICD-10-CM | POA: Diagnosis not present

## 2017-02-04 DIAGNOSIS — M5412 Radiculopathy, cervical region: Secondary | ICD-10-CM | POA: Diagnosis not present

## 2017-02-04 DIAGNOSIS — M5020 Other cervical disc displacement, unspecified cervical region: Secondary | ICD-10-CM | POA: Diagnosis not present

## 2017-02-08 ENCOUNTER — Ambulatory Visit (INDEPENDENT_AMBULATORY_CARE_PROVIDER_SITE_OTHER): Payer: 59 | Admitting: Physical Therapy

## 2017-02-08 ENCOUNTER — Encounter: Payer: Self-pay | Admitting: Physical Therapy

## 2017-02-08 DIAGNOSIS — M542 Cervicalgia: Secondary | ICD-10-CM | POA: Diagnosis not present

## 2017-02-08 DIAGNOSIS — M5412 Radiculopathy, cervical region: Secondary | ICD-10-CM

## 2017-02-08 NOTE — Therapy (Addendum)
[facility] [address] [place], [place], [address] Phone: [phone]   Fax:  [phone]  Physical Therapy Treatment  Patient Details  Name: Zachary Graham MRN: [id number] Date of Birth: 05-23-1960 Referring Provider: [name]  Encounter Date: 02/08/2017      PT End of Session - 02/08/17 1646    Visit Number 5   Number of Visits 12   Date for PT Re-Evaluation 03/07/17   PT Start Time 1600   PT Stop Time 1653   PT Time Calculation (min) 53 min   Activity Tolerance Patient tolerated treatment well;Patient limited by pain   Behavior During Therapy [facility] for tasks assessed/performed      Past Medical History:  Diagnosis Date  . Diabetes mellitus   . GERD (gastroesophageal reflux disease)   . Hyperlipidemia   . Hypertension   . OSA (obstructive sleep apnea)     Past Surgical History:  Procedure Laterality Date  . TONSILLECTOMY      There were no vitals filed for this visit.      Subjective Assessment - 02/08/17 1644    Subjective Pt states that he had follow up wtih Surgeon, and has decided to schedule surgery for Oct 31. Pt does report that PT is helping his symptoms of pain and stiffness.    Currently in Pain? Yes   Pain Score 5    Pain Location Shoulder   Pain Orientation Right   Pain Descriptors / Indicators Aching;Burning   Pain Onset 1 to 4 weeks ago   Pain Frequency Intermittent                         [facility] Adult PT Treatment/Exercise - 02/08/17 1608      Exercises   Exercises Shoulder     Neck Exercises: Seated   Neck Retraction 20 reps   Cervical Rotation 20 reps   Shoulder Rolls 20 reps     Neck Exercises: Supine   Neck Retraction Limitations tried- increased pain   Other Supine Exercise Pec stretch with nerve glide     Shoulder Exercises: Supine   Flexion AROM;20 reps   Flexion Limitations cane     Shoulder Exercises: Standing   Row 20 reps   Theraband Level (Shoulder  Row) Level 2 (Red)     Shoulder Exercises: Pulleys   Flexion 2 minutes     Modalities   Modalities Moist Heat     Moist Heat Therapy   Number Minutes Moist Heat 10 Minutes   Moist Heat Location Cervical     Manual Therapy   Manual Therapy Joint mobilization;Soft tissue mobilization;Manual Traction;Passive ROM   Joint Mobilization side glides   Passive ROM Cervical PROM all motions, manual UT stretch, manual retraction   Manual Traction 10 sec x10     Neck Exercises: Stretches   Corner Stretch 3 reps;30 seconds  Doorway                PT Education - 02/08/17 1645    Education provided Yes   Education Details Ed on plan for PT prior to surgery , and posture    Person(s) Educated Patient   Methods Explanation   Comprehension Verbalized understanding          PT Short Term Goals - 01/24/17 1243      PT SHORT TERM GOAL #1   Title Pt to demo decreased pain in neck and shoulder to 2-3/10 with activity  Time 2   Period Weeks   Status New   Target Date 02/07/17           PT Long Term Goals - 01/24/17 1244      PT LONG TERM GOAL #1   Title Pt to demo improved cervical ROm, to be WNL, to improve ability for head turns while driving   Time 6   Period Weeks   Status New   Target Date 03/07/17     PT LONG TERM GOAL #2   Title Pt to demo decreased pain in cervical and shoulder region to 0-1/10 with activity, to improve ability for work duties   Time 6   Period Weeks   Status New   Target Date 03/07/17     PT LONG TERM GOAL #3   Title Pt to demo ability to self correct posture, in clinic, at least 75 % of the time    Time 6   Period Weeks   Status New   Target Date 03/07/17               Plan - 02/08/17 1647    Clinical Impression Statement Pt continues to have pain in shoulder and upper trap region with most activities. He requires max cuing for decreasing muscle guarding in upper traps with most all activities done today. He has improved  ability for cervical retraction and extension in supine position, but has increased pain with attempts for this in seated position. Pt requests to continue PT eventhough he will be having surgery. He would benefit from decreased frequency to 1x/wk for posture, muscle tightness, and ROM, for 1-3 more weeks, but plan to work towards d/c to HEP in coming weeks prior to surgery.    PT Treatment/Interventions ADLs/Self Care Home Management;Cryotherapy;Electrical Stimulation;Moist Heat;Traction;Ultrasound;Neuromuscular re-education;Therapeutic exercise;Patient/family education;Manual techniques;Taping;Dry needling   PT Home Exercise Plan Cervical rotation; supine pec stretch with nerve glide;  supine retraction;  doorway stretch       Patient will benefit from skilled therapeutic intervention in order to improve the following deficits and impairments:  Hypomobility, Decreased strength, Impaired UE functional use, Pain, Increased muscle spasms, Improper body mechanics, Impaired flexibility  Visit Diagnosis: Cervicalgia  Radiculopathy, cervical region     Problem List Patient Active Problem List   Diagnosis Date Noted  . 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 ([place]) 01/06/2017  . Herniation of cervical intervertebral disc with radiculopathy 01/04/2017  . Current mild episode of major depressive disorder without prior episode ([place]) 12/02/2016  . Chest tightness 12/02/2016  . Diabetic neuropathy ([place]) 03/01/2016  . Hammer toe, acquired 06/19/2015  . Obesity, Class II, BMI 35.0-39.9, with comorbidity (see actual BMI) 04/24/2015  . Type 1 diabetes mellitus with hyperglycemia, with long-term current use of insulin ([place]) 04/04/2015  . Patellofemoral stress syndrome of left knee 03/19/2015  . Allergic rhinitis due to pollen 11/14/2014  . Equinus deformity of foot, acquired 05/29/2014  . ADHD (attention deficit hyperactivity disorder), inattentive type  10/24/2013  . Vitamin D deficiency 02/28/2013  . Pulmonic stenosis 05/25/2012  . Pure hyperglyceridemia 05/25/2012  . Hyperlipidemia with target LDL less than 100 09/20/2011  . OSA (obstructive sleep apnea) 09/20/2011  . BPH (benign prostatic hyperplasia) 09/20/2011  . Routine general medical examination at a health care facility 09/20/2011    Lyndee Hensen 02/08/2017, 4:54 PM  [place], [place], [address] Phone: [phone]   Fax:  [phone]  Name: Zachary Graham  [place] MRN: [id number] Date of Birth: Sep 21, 1960

## 2017-02-10 MED FILL — CONTOUR NEXT STRIPS: 90 days supply | Qty: 600 | Fill #2

## 2017-02-10 MED FILL — DEXTROAMP-AMPHETAMINE 5 MG: 5 | 30 days supply | Qty: 30 | Fill #0

## 2017-02-10 MED FILL — DEXTROAMP-AMP 10 MG TAB: 10 | 30 days supply | Qty: 60 | Fill #0

## 2017-02-15 ENCOUNTER — Ambulatory Visit (INDEPENDENT_AMBULATORY_CARE_PROVIDER_SITE_OTHER): Payer: 59 | Admitting: Physical Therapy

## 2017-02-15 DIAGNOSIS — M542 Cervicalgia: Secondary | ICD-10-CM

## 2017-02-15 DIAGNOSIS — M5412 Radiculopathy, cervical region: Secondary | ICD-10-CM

## 2017-02-15 NOTE — Therapy (Addendum)
[facility] [address] [place], [place], [address] Phone: [phone]   Fax:  [phone]  Physical Therapy Treatment/Discharge  Patient Details  Name: Zachary Graham MRN: [id number] Date of Birth: 1960-08-15 Referring Provider: [name]  Encounter Date: 02/15/2017      PT End of Session - 02/15/17 1640    Visit Number 6   Number of Visits 12   Date for PT Re-Evaluation 03/07/17   PT Start Time 1603   PT Stop Time 1637   PT Time Calculation (min) 34 min   Activity Tolerance Patient tolerated treatment well;Patient limited by pain   Behavior During Therapy [facility] for tasks assessed/performed      Past Medical History:  Diagnosis Date  . Diabetes mellitus   . GERD (gastroesophageal reflux disease)   . Hyperlipidemia   . Hypertension   . OSA (obstructive sleep apnea)     Past Surgical History:  Procedure Laterality Date  . TONSILLECTOMY      There were no vitals filed for this visit.      Subjective Assessment - 02/15/17 1606    Subjective Ibuprofen and Tylenol seem to take edge off of the pain but still has bouts of sharp pain.  Sees Dr. Tamala Julian Thursday.   Pertinent History  Pt states increased pain in neck/shoulder and shoulder blade on R.  She has had significant relief from recent epidural injection. He has most pain in R shoulder, shoulder blade. Pt has had + MRI for nerve compression from c5-c6 compression. 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 He does do a lot of work at [employer].    Patient Stated Goals Decreased pain , increased strength and movement of UE   Currently in Pain? Yes   Pain Score 4   up to 6-7/10   Pain Location Shoulder   Pain Orientation Right   Pain Descriptors / Indicators Aching;Burning   Pain Type Acute pain   Pain Onset 1 to 4 weeks ago   Pain Frequency Intermittent   Aggravating Factors  sitting,  standing, walking   Pain Relieving Factors lying flat            [facility] PT Assessment - 02/15/17 1612      Assessment   Medical Diagnosis Cervical Radiculopathy   Referring Provider [name]   Hand Dominance Right     Posture/Postural Control   Posture/Postural Control Postural limitations   Postural Limitations Rounded Shoulders;Forward head   Posture Comments significanty guarded posture with all movement; minimal active neck motion     ROM / Strength   AROM / PROM / Strength Strength     AROM   Cervical Flexion 27   Cervical Extension 32   Cervical - Right Side Bend 18   Cervical - Left Side Bend 18   Cervical - Right Rotation 48   Cervical - Left Rotation 48     Strength   Strength Assessment Site Hand   Right/Left hand Right;Left   Right Hand Grip (lbs) 66  66, 65, 67   Left Hand Grip (lbs) 65  70, 65, 60                     [facility] Adult PT Treatment/Exercise - 02/15/17 1612      Self-Care   Self-Care Other Self-Care Comments   Other Self-Care Comments  verbally reviewed HEP and discussed tips for  increasing postural awareness                PT Education - 02/15/17 1640    Education provided Yes   Education Details see self care   Person(s) Educated Patient   Methods Explanation   Comprehension Verbalized understanding          PT Short Term Goals - 02/15/17 1640      PT SHORT TERM GOAL #1   Title Pt to demo decreased pain in neck and shoulder to 2-3/10 with activity    Baseline 10/1: constant 4-5/10 with medication   Status Not Met           PT Long Term Goals - 02/15/17 1641      PT LONG TERM GOAL #1   Title Pt to demo improved cervical ROm, to be WNL, to improve ability for head turns while driving   Status On-going   Target Date 03/07/17     PT LONG TERM GOAL #2   Title Pt to demo decreased pain in cervical and shoulder region to 0-1/10 with activity, to improve ability for work duties   Status On-going    Target Date 03/07/17     PT LONG TERM GOAL #3   Title Pt to demo ability to self correct posture, in clinic, at least 75 % of the time    Status On-going               Plan - 02/15/17 1641    Clinical Impression Statement Pt still with elevated pain that hasn't improved since beginning PT.  Continues to amb with guarded posture and minimal neck movement.  Pt at this time requesting to hold PT as he is scheduled for surgery on 03/16/17.  Will follow up after MD appointment this week to determine d/c v. continue.   PT Treatment/Interventions ADLs/Self Care Home Management;Cryotherapy;Electrical Stimulation;Moist Heat;Traction;Ultrasound;Neuromuscular re-education;Therapeutic exercise;Patient/family education;Manual techniques;Taping;Dry needling   PT Next Visit Plan hold PT; may d/c as pt has surgery scheduled; continue posture/stretching/ROM exercises   Consulted and Agree with Plan of Care Patient      Patient will benefit from skilled therapeutic intervention in order to improve the following deficits and impairments:  Hypomobility, Decreased strength, Impaired UE functional use, Pain, Increased muscle spasms, Improper body mechanics, Impaired flexibility  Visit Diagnosis: Cervicalgia  Radiculopathy, cervical region     Problem List Patient Active Problem List   Diagnosis Date Noted  . 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 ([place]) 01/06/2017  . Herniation of cervical intervertebral disc with radiculopathy 01/04/2017  . Current mild episode of major depressive disorder without prior episode ([place]) 12/02/2016  . Chest tightness 12/02/2016  . Diabetic neuropathy ([place]) 03/01/2016  . Hammer toe, acquired 06/19/2015  . Obesity, Class II, BMI 35.0-39.9, with comorbidity (see actual BMI) 04/24/2015  . Type 1 diabetes mellitus with hyperglycemia, with long-term current use of insulin ([place]) 04/04/2015  . Patellofemoral stress syndrome of  left knee 03/19/2015  . Allergic rhinitis due to pollen 11/14/2014  . Equinus deformity of foot, acquired 05/29/2014  . ADHD (attention deficit hyperactivity disorder), inattentive type 10/24/2013  . Vitamin D deficiency 02/28/2013  . Pulmonic stenosis 05/25/2012  . Pure hyperglyceridemia 05/25/2012  . Hyperlipidemia with target LDL less than 100 09/20/2011  . OSA (obstructive sleep apnea) 09/20/2011  . BPH (benign prostatic hyperplasia) 09/20/2011  . Routine general medical examination at a health care facility 09/20/2011      Laureen Abrahams, PT,  DPT 02/15/17 4:44 PM    [facility] [place], [place], [address] Phone: [phone]   Fax:  [phone]  Name: ANIKIN PROSSER MRN: [id number] Date of Birth: August 31, 1960       PHYSICAL THERAPY DISCHARGE SUMMARY  Visits from Start of Care: 6  Current functional level related to goals / functional outcomes: See above   Remaining deficits: Unknown; pt scheduled for surgery   Education / Equipment: HEP  Plan: Patient agrees to discharge.  Patient goals were not met. Patient is being discharged due to a change in medical status.  ?????     Laureen Abrahams, PT, DPT 03/18/17 10:24 AM   [facility] [address] [place], [place], [address] Phone: [phone]  Fax: [phone]

## 2017-02-17 ENCOUNTER — Encounter: Payer: Self-pay | Admitting: Family Medicine

## 2017-02-17 ENCOUNTER — Ambulatory Visit (INDEPENDENT_AMBULATORY_CARE_PROVIDER_SITE_OTHER): Payer: 59 | Admitting: Family Medicine

## 2017-02-17 DIAGNOSIS — M5010 Cervical disc disorder with radiculopathy, unspecified cervical region: Secondary | ICD-10-CM

## 2017-02-17 NOTE — Assessment & Plan Note (Signed)
Spent  25 minutes with patient face-to-face and had greater than 50% of counseling including as described in assessment and plan.Patient continues to be somewhat symptomatic. Patient has made some improvement with physical therapy but still states that when he coughs has worsening pain. Patient states and still has some radiation of the pain seems to go down his arm from time to time. Patient states that this can be severe. Patient does not want this to be is new normal. When it is no different type of treatment options. We discussed with him about different medications but he has had difficulty with some of the neuropathic pain medications. Discontinued the Cymbalta already. We discussed with patient about icing regimen, home exercises. We discussed with him about repeating the epidural that he did have some resolution of the pain as a possibility. Patient though continues to have the weakness and we discussed probably in his best interest surgical intervention could be his best resort. Patient will consider other treatment options and will come back if he decides to change

## 2017-02-17 NOTE — Progress Notes (Signed)
[name] [place], [place] [address] Phone: [phone] Subjective:     CC: Neck pain follow-up  [id number]  Zachary Graham is a 56 y.o. male coming in with complaint of neck pain. Patient does have known cervical radiculopathy. MRI has been done showing a C5-C6 nerve impingement. Patient was given an epidural 01/13/2017. Patient did have improvement but unfortunately seems to have plateaued. Patient did see neurosurgery and is scheduled now for surgery in the near future. Patient is wondering and wants to asked questions.      Past Medical History:  Diagnosis Date  . Diabetes mellitus   . GERD (gastroesophageal reflux disease)   . Hyperlipidemia   . Hypertension   . OSA (obstructive sleep apnea)    Past Surgical History:  Procedure Laterality Date  . TONSILLECTOMY     Social History   Social History  . Marital status: Married    Spouse name: N/A  . Number of children: 3  . Years of education: N/A   Occupational History  . RADIATION SAFETY OFFICER [employer]   Social History Main Topics  . Smoking status: Never Smoker  . Smokeless tobacco: Never Used  . Alcohol use No  . Drug use: No  . Sexual activity: Yes    Partners: Female   Other Topics Concern  . Not on file   Social History Narrative   Regular exercise: runs 3 miles 3x week, takes one day off per week to rest   Caffeine use: stopped all diet soda on 04/23/15 per advice from Sports MD   Allergies  Allergen Reactions  . Zetia [Ezetimibe]     abd cramps  . Lisinopril   . Penicillins   . Shellfish Allergy    Family History  Problem Relation Age of Onset  . Colon cancer Maternal Grandfather   . Colon cancer Paternal Grandfather   . Heart disease Father        Valve replacement; pacemaker  . Alcohol abuse Neg Hx   . Asthma Neg Hx   . Diabetes Neg Hx   . Hyperlipidemia Neg Hx   . Hypertension Neg Hx   . Kidney disease Neg Hx   . Stroke Neg Hx       Past medical history, social, surgical and family history all reviewed in electronic medical record.  No pertanent information unless stated regarding to the chief complaint.   Review of Systems:Review of systems updated and as accurate as of 02/17/17  No headache, visual changes, nausea, vomiting, diarrhea, constipation, dizziness, abdominal pain, skin rash, fevers, chills, night sweats, weight loss, swollen lymph nodes, body aches, joint swelling, muscle aches, chest pain, shortness of breath, mood changes.   Objective  There were no vitals taken for this visit. Systems examined below as of 02/17/17   General: No apparent distress alert and oriented x3 mood and affect normal, dressed appropriately.  HEENT: Pupils equal, extraocular movements intact  Respiratory: Patient's speak in full sentences and does not appear short of breath  Cardiovascular: No lower extremity edema, non tender, no erythema  Skin: Warm dry intact with no signs of infection or rash on extremities or on axial skeleton.  Abdomen: Soft nontender  Neuro: Cranial nerves II through XII are intact, neurovascularly intact in all extremities with 2+ DTRs and 2+ pulses.  Lymph: No lymphadenopathy of posterior or anterior cervical chain or axillae bilaterally.  Gait normal with good balance and coordination.  MSK:  Non tender  with full range of motion and good stability and symmetric strength and tone of shoulders, elbows, wrist, hip, knee and ankles bilaterally.  Neck: Inspection unremarkable. No palpable stepoffs. Negative Spurling's maneuver. Full neck range of motion Grip strength and sensation normal in bilateral hands Strength good C4 to T1 distribution No sensory change to C4 to T1 Negative Hoffman sign bilaterally Reflexes normal Patient's hand exam still shows some very mild weakness in the C6 distribution on the right side compared to the left side.   Impression and Recommendations:     This case  required medical decision making of moderate complexity.      Note: This dictation was prepared with Dragon dictation along with smaller phrase technology. Any transcriptional errors that result from this process are unintentional.

## 2017-02-22 ENCOUNTER — Other Ambulatory Visit: Payer: Self-pay | Admitting: Podiatry

## 2017-02-22 DIAGNOSIS — M722 Plantar fascial fibromatosis: Secondary | ICD-10-CM

## 2017-02-23 ENCOUNTER — Other Ambulatory Visit: Payer: Self-pay | Admitting: Neurosurgery

## 2017-03-10 ENCOUNTER — Encounter: Payer: Self-pay | Admitting: *Deleted

## 2017-03-10 ENCOUNTER — Other Ambulatory Visit: Payer: Self-pay | Admitting: Internal Medicine

## 2017-03-10 ENCOUNTER — Other Ambulatory Visit: Payer: Self-pay | Admitting: *Deleted

## 2017-03-10 VITALS — BP 128/70 | Ht 70.0 in | Wt 249.0 lb

## 2017-03-10 DIAGNOSIS — Z794 Long term (current) use of insulin: Principal | ICD-10-CM

## 2017-03-10 DIAGNOSIS — E11319 Type 2 diabetes mellitus with unspecified diabetic retinopathy without macular edema: Secondary | ICD-10-CM

## 2017-03-10 LAB — POCT CBG (FASTING - GLUCOSE)-MANUAL ENTRY: GLUCOSE FASTING, POC: 252 mg/dL — AB (ref 70–99)

## 2017-03-10 LAB — POCT GLYCOSYLATED HEMOGLOBIN (HGB A1C): Hemoglobin A1C: 7.6

## 2017-03-10 MED FILL — DEXTROAMP-AMPHETAMINE 5 MG: 5 | 30 days supply | Qty: 30 | Fill #0

## 2017-03-10 MED FILL — PANTOPRAZOLE SOD DR 40 MG T: 40 | 90 days supply | Qty: 90 | Fill #0

## 2017-03-10 MED FILL — NovoLOG 100 UNIT/ML SOLN: 100 | 24 days supply | Qty: 60 | Fill #3

## 2017-03-10 MED FILL — metFORMIN HCL 500 MG TABS: 500 | 90 days supply | Qty: 180 | Fill #1

## 2017-03-10 MED FILL — DEXTROAMP-AMP 10 MG TAB: 10 | 30 days supply | Qty: 60 | Fill #0

## 2017-03-10 NOTE — Patient Outreach (Signed)
[facility]) Care Management   03/10/2017  Zachary Graham August 05, 1960 [id number]  Zachary Graham is an 56 y.o. male who presents to the [place] Management office for routine Link To Wellness follow up for self management assistance with Type II DM, HTN, hyperlipidemia and obesity.  Subjective:  Zachary Graham says he has a herniated cervical disk and will have surgery on 03/16/17 if his medical leave is approved by [employer]. He says he  Is now wearing the Medtronic 670 G insulin pump but is still having difficulty with the sensor and continuous glucose monitor not connecting with the pump so he wants to pursue using the Freestyle Libre flash glucose system even though it does not connect with the pump.  He agrees to a POC Hgb A1C since he will be having surgery next week.    Objective:   Review of Systems  Constitutional: Negative.     Physical Exam  Constitutional: He is oriented to person, place, and time. He appears well-developed and well-nourished.  Respiratory: Effort normal.  Neurological: He is alert and oriented to person, place, and time.  Skin: Skin is warm and dry.  Psychiatric: He has a normal mood and affect. His behavior is normal. Judgment and thought content normal.     Today's Vitals   03/10/17 1119  BP: 128/70  Weight: 249 lb (112.9 kg)  Height: 1.778 m (5\' 10" )  PainSc: 0-No pain   POC Hgb A1C= 7.6% POC Post prandial CBG= 252- Zachary Graham says he did not bolus with the meal  Current Meds  Medication Sig  . Alpha-Lipoic Acid 600 MG CAPS Take by mouth.  Marland Kitchen amphetamine-dextroamphetamine (ADDERALL) 10 MG tablet Take 1 tablet (10 mg total) by mouth 2 (two) times daily.  Marland Kitchen amphetamine-dextroamphetamine (ADDERALL) 5 MG tablet Take 1 tablet (5 mg total) by mouth daily.  . Ascorbic Acid (VITAMIN C) 1000 MG tablet Take 1,000 mg by mouth daily.  Marland Kitchen b complex vitamins tablet Take 1 tablet by mouth daily.  . Biotin 10000 MCG TABS Take  by mouth.  . Calcium-Magnesium-Vitamin D (CALCIUM 500 PO) Take by mouth.  . Cholecalciferol (VITAMIN D-3 PO) Take 1,000 mg by mouth daily.  . insulin lispro (HUMALOG) 100 UNIT/ML injection For use in insulin pump  . losartan (COZAAR) 50 MG tablet TAKE 1 TABLET BY MOUTH ONCE DAILY  . Melatonin 5 MG CHEW Chew 2 tablets by mouth at bedtime.  . metFORMIN (GLUCOPHAGE) 500 MG tablet Take 2 tablets (1,000 mg total) by mouth daily with supper.  . Misc Natural Products (TURMERIC CURCUMIN) CAPS Take 1 capsule by mouth.  . pantoprazole (PROTONIX) 40 MG tablet TAKE 1 TABLET BY MOUTH ONCE DAILY  . QNASL 80 MCG/ACT AERS PLACE 4 PUFFS INTO THE NOSE DAILY.  . simvastatin (ZOCOR) 40 MG tablet Take 1 tablet (40 mg total) by mouth daily.     Functional Status:   In your present state of health, do you have any difficulty performing the following activities: 11/22/2016 04/22/2016  Hearing? N N  Vision? N N  Difficulty concentrating or making decisions? N N  Walking or climbing stairs? N N  Dressing or bathing? N N  Doing errands, shopping? N N  Preparing Food and eating ? N -  Using the Toilet? N -  In the past six months, have you accidently leaked urine? N -  Do you have problems with loss of bowel control? N -  Managing your Medications? N -  Managing your Finances? N -  Housekeeping or managing your Housekeeping? N -  Some recent data might be hidden    Fall/Depression Screening:    PHQ 2/9 Scores 11/22/2016 01/30/2015 01/16/2015 12/20/2014 06/26/2014 07/25/2013 06/26/2012  PHQ - 2 Score 0 0 0 0 0 0 0    Assessment:   [employer] employee and Link To Wellness member with Type I DM, HTN, hyperlipidemia, and obesity, on insulin pump therapy, currently not using the sensor and continuous glucose monitor.  Plan:  [facility] CM Care Plan Problem One        Most Recent Value   Care Plan Problem One  Patient with longstanding Type 1.5 DM being managed as Type I, on insulin pump,  with today's POC Hgb A1C decreased  to 7.6% , down from 9.7% on 12/02/16. Also with HTN and hyperlipidemia and meeting treatment targets for HTN as evidenced by BP readings consistently <130/<80, lipid profile on 12/02/16 showed overall elevation in all elements with triglycerides higher than target at 290 previously 176, obese with some recent weight gain with current BMI= 35.73    Role Documenting the Problem One  Care Management [place] for Problem One  Active   [facility] Long Term Goal (31-90 days)  Improved control of Type I DM as evidenced by improved Hgb A1C at the next assessment without increased episodes of hypoglycemia ( no more than 4 per week) and no severe hypoglycemia, ongoing good control of HTN as evidenced by normal BP readings and improved lipid management as evidenced by normal lipid profile at each assessment , evidence of weight loss or no weight gain at next visit    [place] Term Goal Start Date  03/10/17   Interventions for Problem One Long Term Goal  Discussed upcoming cervical spine surgery, assessed blood sugar readings, meal plan and exercise regimen, and current insulin pump settings, assessed POC CBG and POC Hgb A1C and reviewed results and targets, discussed pros and cons of the 670G  insulin pump, discussed barriers to using the Guardian sensor and continuous glucose monitor and encouraged Zachary Graham to contact the Medtronic rep for help with trouble shooting sensor issues and ask if the Guardian 3 sensor is now available, reviewed information on the [employer] continuous glucose monitor and advised Zachary Graham that he can now get the system from a [facility] outpatient pharmacy,  encouraged Zachary Graham to resume some form of exercise once he has been cleared by his neurosurgeon,  reviewed upcoming appointment for preadmission tests on 03/14/17, will arrange for Link To Wellness follow up in 3 months     RNCM to fax today's office visit note to Dr. Ronnald Ramp and Dr. Cruzita Lederer. RNCM will meet quarterly and as needed with patient  per Link To Wellness program guidelines to assist with IDDM, HTN, hyperlipidemia and obesity self-management and assess patient's progress toward mutually set goals.  Barrington Ellison RN,CCM,CDE [place] Management Coordinator Link To Wellness Office Phone [phone] Office Fax [phone]

## 2017-03-11 ENCOUNTER — Encounter: Payer: Self-pay | Admitting: Internal Medicine

## 2017-03-11 ENCOUNTER — Other Ambulatory Visit: Payer: Self-pay | Admitting: Internal Medicine

## 2017-03-11 MED ORDER — FREESTYLE LIBRE 14 DAY SENSOR MISC: 1.0000 | 3 refills | Status: DC

## 2017-03-11 MED ORDER — FREESTYLE LIBRE 14 DAY READER DEVI: 1.0000 | Freq: Once | 1 refills | Status: AC

## 2017-03-11 NOTE — Pre-Procedure Instructions (Signed)
Zachary Graham  03/11/2017      [place], [place] - [address] [place] [address] Phone: [phone] Fax: [phone]  [id number] - [place], [place] [address]  [place] [address] Phone: [phone] Fax: [phone]    Your procedure is scheduled on Wednesday, 03/16/2017.  Report to [facility] Admitting at [email or web address].[email or web address].  Call this number if you have problems the morning of surgery:  [phone]   Remember: Do not eat food or drink liquids after midnight.  Continue all other medications as directed by your physician except follow these medication instructions before surgery    Take these medicines the morning of surgery with A SIP OF WATER: Pantoprazole (Protonix) QNASL (Nasal Spray)  7 days prior to surgery STOP taking any Aspirin (unless otherwise instructed by your surgeon), Aleve, Naproxen, Ibuprofen, Motrin, Advil, Goody's, BC's, all herbal medications, fish oil, and all vitamins  WHAT DO I DO ABOUT MY DIABETES MEDICATION?  Marland Kitchen Do not take oral diabetes medicines (pills) the morning of surgery. - Do NOT take your metformin the morning of surgery.  . THE NIGHT BEFORE SURGERY, take ___________ units of ___________insulin.      . THE MORNING OF SURGERY, take _____________ units of __________insulin.   Contact your physician that manages your insulin pump for instructions regarding your insulin pump for the day of surgery.   How to Manage Your Diabetes Before and After Surgery  Why is it important to control my blood sugar before and after surgery? . Improving blood sugar levels before and after surgery helps healing and can limit problems. . A way of improving blood sugar control is eating a healthy diet by: o  Eating less sugar and carbohydrates o  Increasing activity/exercise o  Talking with your doctor  about reaching your blood sugar goals . High blood sugars (greater than 180 mg/dL) can raise your risk of infections and slow your recovery, so you will need to focus on controlling your diabetes during the weeks before surgery. . Make sure that the doctor who takes care of your diabetes knows about your planned surgery including the date and location.  How do I manage my blood sugar before surgery? . Check your blood sugar at least 4 times a day, starting 2 days before surgery, to make sure that the level is not too high or low. o Check your blood sugar the morning of your surgery when you wake up and every 2 hours until you get to the Short Stay unit. . If your blood sugar is less than 70 mg/dL, you will need to treat for low blood sugar: o Do not take insulin. o Treat a low blood sugar (less than 70 mg/dL) with  cup of clear juice (cranberry or apple), 4 glucose tablets, OR glucose gel. o Recheck blood sugar in 15 minutes after treatment (to make sure it is greater than 70 mg/dL). If your blood sugar is not greater than 70 mg/dL on recheck, call [phone] for further instructions. . Report your blood sugar to the short stay nurse when you get to Short Stay.  . If you are admitted to the hospital after surgery: o Your blood sugar will be checked by the staff and you will probably be given insulin after surgery (instead of oral diabetes medicines) to make sure you have good blood sugar levels. o The  goal for blood sugar control after surgery is 80-180 mg/dL.     Do not wear jewelry, make-up or nail polish.  Do not wear lotions, powders, or perfumes, or deoderant.  Do not shave 48 hours prior to surgery.  Men may shave face and neck.  Do not bring valuables to the hospital.  [facility] is not responsible for any belongings or valuables.  Contacts, dentures or bridgework may not be worn into surgery.  Leave your suitcase in the car.  After surgery it may be brought to your room.  For  patients admitted to the hospital, discharge time will be determined by your treatment team.  Patients discharged the day of surgery will not be allowed to drive home.   Name and phone number of your driver:    Special instructions:   [place]- Preparing For Surgery  Before surgery, you can play an important role. Because skin is not sterile, your skin needs to be as free of germs as possible. You can reduce the number of germs on your skin by washing with CHG (chlorahexidine gluconate) Soap before surgery.  CHG is an antiseptic cleaner which kills germs and bonds with the skin to continue killing germs even after washing.  Please do not use if you have an allergy to CHG or antibacterial soaps. If your skin becomes reddened/irritated stop using the CHG.  Do not shave (including legs and underarms) for at least 48 hours prior to first CHG shower. It is OK to shave your face.  Please follow these instructions carefully.   1. Shower the NIGHT BEFORE SURGERY and the MORNING OF SURGERY with CHG.   2. If you chose to wash your hair, wash your hair first as usual with your normal shampoo.  3. After you shampoo, rinse your hair and body thoroughly to remove the shampoo.  4. Use CHG as you would any other liquid soap. You can apply CHG directly to the skin and wash gently with a scrungie or a clean washcloth.   5. Apply the CHG Soap to your body ONLY FROM THE NECK DOWN.  Do not use on open wounds or open sores. Avoid contact with your eyes, ears, mouth and genitals (private parts). Wash Face and genitals (private parts)  with your normal soap.  6. Wash thoroughly, paying special attention to the area where your surgery will be performed.  7. Thoroughly rinse your body with warm water from the neck down.  8. DO NOT shower/wash with your normal soap after using and rinsing off the CHG Soap.  9. Pat yourself dry with a CLEAN TOWEL.  10. Wear CLEAN PAJAMAS to bed the night before surgery, wear  comfortable clothes the morning of surgery  11. Place CLEAN SHEETS on your bed the night of your first shower and DO NOT SLEEP WITH PETS.    Day of Surgery: Shower as stated above Do not apply any deodorants/lotions.  Please wear clean clothes to the hospital/surgery center.      Please read over the following fact sheets that you were given. Coughing and Deep Breathing, MRSA Information and Surgical Site Infection Prevention

## 2017-03-14 ENCOUNTER — Encounter: Payer: Self-pay | Admitting: Internal Medicine

## 2017-03-14 ENCOUNTER — Encounter (HOSPITAL_COMMUNITY)
Admission: RE | Admit: 2017-03-14 | Discharge: 2017-03-14 | Disposition: A | Discharge status: Home / Self Care | Payer: 59 | Source: Ambulatory Visit | Attending: Neurosurgery | Admitting: Neurosurgery

## 2017-03-14 ENCOUNTER — Encounter (HOSPITAL_COMMUNITY): Payer: Self-pay

## 2017-03-14 DIAGNOSIS — G4733 Obstructive sleep apnea (adult) (pediatric): Secondary | ICD-10-CM | POA: Diagnosis not present

## 2017-03-14 DIAGNOSIS — K219 Gastro-esophageal reflux disease without esophagitis: Secondary | ICD-10-CM | POA: Diagnosis not present

## 2017-03-14 DIAGNOSIS — Z6835 Body mass index (BMI) 35.0-35.9, adult: Secondary | ICD-10-CM | POA: Diagnosis not present

## 2017-03-14 DIAGNOSIS — Z7982 Long term (current) use of aspirin: Secondary | ICD-10-CM | POA: Diagnosis not present

## 2017-03-14 DIAGNOSIS — Z91013 Allergy to seafood: Secondary | ICD-10-CM | POA: Diagnosis not present

## 2017-03-14 DIAGNOSIS — E669 Obesity, unspecified: Secondary | ICD-10-CM | POA: Diagnosis not present

## 2017-03-14 DIAGNOSIS — E559 Vitamin D deficiency, unspecified: Secondary | ICD-10-CM | POA: Diagnosis not present

## 2017-03-14 DIAGNOSIS — F909 Attention-deficit hyperactivity disorder, unspecified type: Secondary | ICD-10-CM | POA: Diagnosis not present

## 2017-03-14 DIAGNOSIS — Z794 Long term (current) use of insulin: Secondary | ICD-10-CM | POA: Diagnosis not present

## 2017-03-14 DIAGNOSIS — E781 Pure hyperglyceridemia: Secondary | ICD-10-CM | POA: Diagnosis not present

## 2017-03-14 DIAGNOSIS — Z79899 Other long term (current) drug therapy: Secondary | ICD-10-CM | POA: Diagnosis not present

## 2017-03-14 DIAGNOSIS — E103299 Type 1 diabetes mellitus with mild nonproliferative diabetic retinopathy without macular edema, unspecified eye: Secondary | ICD-10-CM | POA: Diagnosis not present

## 2017-03-14 DIAGNOSIS — Z888 Allergy status to other drugs, medicaments and biological substances status: Secondary | ICD-10-CM | POA: Diagnosis not present

## 2017-03-14 DIAGNOSIS — I10 Essential (primary) hypertension: Secondary | ICD-10-CM | POA: Diagnosis not present

## 2017-03-14 DIAGNOSIS — R011 Cardiac murmur, unspecified: Secondary | ICD-10-CM | POA: Diagnosis not present

## 2017-03-14 DIAGNOSIS — Z880 Allergy status to penicillin: Secondary | ICD-10-CM | POA: Diagnosis not present

## 2017-03-14 DIAGNOSIS — M50122 Cervical disc disorder at C5-C6 level with radiculopathy: Secondary | ICD-10-CM | POA: Diagnosis not present

## 2017-03-14 DIAGNOSIS — E1040 Type 1 diabetes mellitus with diabetic neuropathy, unspecified: Secondary | ICD-10-CM | POA: Diagnosis not present

## 2017-03-14 DIAGNOSIS — F329 Major depressive disorder, single episode, unspecified: Secondary | ICD-10-CM | POA: Diagnosis not present

## 2017-03-14 DIAGNOSIS — N400 Enlarged prostate without lower urinary tract symptoms: Secondary | ICD-10-CM | POA: Diagnosis not present

## 2017-03-14 HISTORY — DX: Pneumonia, unspecified organism: J18.9

## 2017-03-14 HISTORY — DX: Cardiac murmur, unspecified: R01.1

## 2017-03-14 LAB — CBC
HEMATOCRIT: 41.5 % (ref 39.0–52.0)
Hemoglobin: 13.7 g/dL (ref 13.0–17.0)
MCH: 28.9 pg (ref 26.0–34.0)
MCHC: 33 g/dL (ref 30.0–36.0)
MCV: 87.6 fL (ref 78.0–100.0)
PLATELETS: 198 10*3/uL (ref 150–400)
RBC: 4.74 MIL/uL (ref 4.22–5.81)
RDW: 14.5 % (ref 11.5–15.5)
WBC: 8.5 10*3/uL (ref 4.0–10.5)

## 2017-03-14 LAB — BASIC METABOLIC PANEL
ANION GAP: 10 (ref 5–15)
BUN: 14 mg/dL (ref 6–20)
CALCIUM: 9.3 mg/dL (ref 8.9–10.3)
CO2: 25 mmol/L (ref 22–32)
Chloride: 103 mmol/L (ref 101–111)
Creatinine, Ser: 0.78 mg/dL (ref 0.61–1.24)
GFR calc Af Amer: >60 mL/min (ref >60)
GFR calc non Af Amer: >60 mL/min (ref >60)
GLUCOSE: 116 mg/dL — AB (ref 65–99)
Potassium: 4.6 mmol/L (ref 3.5–5.1)
Sodium: 138 mmol/L (ref 135–145)

## 2017-03-14 LAB — GLUCOSE, CAPILLARY: Glucose-Capillary: 111 mg/dL — ABNORMAL HIGH (ref 65–99)

## 2017-03-14 LAB — SURGICAL PCR SCREEN
MRSA, PCR: NEGATIVE
Staphylococcus aureus: NEGATIVE

## 2017-03-14 NOTE — Progress Notes (Signed)
PCP - Dr. Ronnald Ramp Cardiologist - patient denies  Chest x-ray - n/a EKG - 12/02/2016 Stress Test - 12/2016 ECHO - 2014 Cardiac Cath - patient denies  Sleep Study - 2012; in Epic CPAP - yes  Fasting Blood Sugar - 100's Checks Blood Sugar 7  times a day Endocrinologist - Dr. Benjiman Core; instructed patient to contact endocrinologist regarding instructions for insulin pump for the day of surgery.  Patient verbalized understanding.  Patient also instructed to bring pump supplies.    Patient denies shortness of breath, fever, cough and chest pain at PAT appointment   Patient verbalized understanding of instructions that were given to them at the PAT appointment. Patient was also instructed that they will need to review over the PAT instructions again at home before surgery.

## 2017-03-15 ENCOUNTER — Encounter: Payer: Self-pay | Admitting: Internal Medicine

## 2017-03-15 MED ORDER — VANCOMYCIN HCL 10 G IV SOLR
1500.0000 mg | INTRAVENOUS | Status: DC
  Filled 2017-03-15: qty 1500

## 2017-03-15 NOTE — Progress Notes (Signed)
Anesthesia Chart Review:  Pt is a 56 year old male scheduled for C5-6 disc arthroplasty on 03/16/2017 with Jovita Gamma, MD  - PCP is Scarlette Calico, MD - Endocrinologist is Philemon Kingdom, MD  PMH includes:  HTN, type 1 DM, heart murmur (PCP notes document "no murmur"), OSA, hyperlipidemia, GERD.  Never smoker. BMI 36  Medications include: adderall, ASA 81mg , humalog (on insulin pump), losartan, metformin, protonix, simvastatin  BP 115/65   Pulse 65   Temp 36.9 C   Resp 20   Ht 5\' 10"  (1.778 m)   Wt 249 lb 9.6 oz (113.2 kg)   SpO2 97%   BMI 35.81 kg/m   Preoperative labs reviewed.   - Glucose 116. HbA1c was 7.6 on 03/10/17  EKG 12/02/16: Sinus  Rhythm  -First degree A-V block  - occasional ectopic ventricular beat   Nuclear stress test 12/16/16:   The left ventricular ejection fraction is mildly decreased (45-54%).  Nuclear stress EF: 51%.  Blood pressure demonstrated a hypertensive response to exercise.  Horizontal ST segment depression ST segment depression was noted during stress in the II, III and V6 leads, and returning to baseline after less than 1 minute of recovery.  This is a low risk study.  Echo 06/13/12:  - Left ventricle: The cavity size was normal. Wall thickness was normal. Systolic function was normal. The estimatedejection fraction was in the range of 55% to 65%. Wallmotion was normal; there were no regional wall motionabnormalities. Features are consistent with a pseudonormalleft ventricular filling pattern, with concomitantabnormal relaxation and increased filling pressure (grade2 diastolic dysfunction). - Left atrium: The atrium was mildly dilated. - Atrial septum: No defect or patent foramen ovale was identified. - Pulmonic valve: The findings are consistent with mild stenosis. Peak gradient: 80mm Hg (S).  If no changes, I anticipate pt can proceed with surgery as scheduled.   Willeen Cass, FNP-BC [facility] Short Stay Surgical  Center/Anesthesiology Phone: [phone] 03/15/2017 10:22 AM

## 2017-03-16 ENCOUNTER — Encounter (HOSPITAL_COMMUNITY)
Admission: RE | Disposition: A | Discharge status: Home / Self Care | Payer: Self-pay | Source: Ambulatory Visit | Attending: Neurosurgery

## 2017-03-16 ENCOUNTER — Ambulatory Visit (HOSPITAL_COMMUNITY): Payer: 59 | Admitting: Emergency Medicine

## 2017-03-16 ENCOUNTER — Encounter (HOSPITAL_COMMUNITY): Payer: Self-pay | Admitting: Urology

## 2017-03-16 ENCOUNTER — Observation Stay (HOSPITAL_COMMUNITY)
Admission: RE | Admit: 2017-03-16 | Discharge: 2017-03-16 | Disposition: A | Discharge status: Home / Self Care | Payer: 59 | Source: Ambulatory Visit | Attending: Neurosurgery | Admitting: Neurosurgery

## 2017-03-16 ENCOUNTER — Ambulatory Visit (HOSPITAL_COMMUNITY): Payer: 59

## 2017-03-16 ENCOUNTER — Ambulatory Visit (HOSPITAL_COMMUNITY): Payer: 59 | Admitting: Anesthesiology

## 2017-03-16 DIAGNOSIS — G4733 Obstructive sleep apnea (adult) (pediatric): Secondary | ICD-10-CM | POA: Insufficient documentation

## 2017-03-16 DIAGNOSIS — Z6835 Body mass index (BMI) 35.0-35.9, adult: Secondary | ICD-10-CM | POA: Insufficient documentation

## 2017-03-16 DIAGNOSIS — F909 Attention-deficit hyperactivity disorder, unspecified type: Secondary | ICD-10-CM | POA: Insufficient documentation

## 2017-03-16 DIAGNOSIS — Z794 Long term (current) use of insulin: Secondary | ICD-10-CM | POA: Insufficient documentation

## 2017-03-16 DIAGNOSIS — M5020 Other cervical disc displacement, unspecified cervical region: Secondary | ICD-10-CM | POA: Diagnosis present

## 2017-03-16 DIAGNOSIS — F329 Major depressive disorder, single episode, unspecified: Secondary | ICD-10-CM | POA: Diagnosis not present

## 2017-03-16 DIAGNOSIS — E559 Vitamin D deficiency, unspecified: Secondary | ICD-10-CM | POA: Insufficient documentation

## 2017-03-16 DIAGNOSIS — E103299 Type 1 diabetes mellitus with mild nonproliferative diabetic retinopathy without macular edema, unspecified eye: Secondary | ICD-10-CM | POA: Diagnosis not present

## 2017-03-16 DIAGNOSIS — Z419 Encounter for procedure for purposes other than remedying health state, unspecified: Secondary | ICD-10-CM

## 2017-03-16 DIAGNOSIS — N400 Enlarged prostate without lower urinary tract symptoms: Secondary | ICD-10-CM | POA: Insufficient documentation

## 2017-03-16 DIAGNOSIS — E1040 Type 1 diabetes mellitus with diabetic neuropathy, unspecified: Secondary | ICD-10-CM | POA: Insufficient documentation

## 2017-03-16 DIAGNOSIS — M50122 Cervical disc disorder at C5-C6 level with radiculopathy: Principal | ICD-10-CM | POA: Insufficient documentation

## 2017-03-16 DIAGNOSIS — E781 Pure hyperglyceridemia: Secondary | ICD-10-CM | POA: Diagnosis not present

## 2017-03-16 DIAGNOSIS — Z880 Allergy status to penicillin: Secondary | ICD-10-CM | POA: Insufficient documentation

## 2017-03-16 DIAGNOSIS — I10 Essential (primary) hypertension: Secondary | ICD-10-CM | POA: Insufficient documentation

## 2017-03-16 DIAGNOSIS — Z7982 Long term (current) use of aspirin: Secondary | ICD-10-CM | POA: Insufficient documentation

## 2017-03-16 DIAGNOSIS — Z91013 Allergy to seafood: Secondary | ICD-10-CM | POA: Insufficient documentation

## 2017-03-16 DIAGNOSIS — Z79899 Other long term (current) drug therapy: Secondary | ICD-10-CM | POA: Insufficient documentation

## 2017-03-16 DIAGNOSIS — E669 Obesity, unspecified: Secondary | ICD-10-CM | POA: Insufficient documentation

## 2017-03-16 DIAGNOSIS — R011 Cardiac murmur, unspecified: Secondary | ICD-10-CM | POA: Insufficient documentation

## 2017-03-16 DIAGNOSIS — K219 Gastro-esophageal reflux disease without esophagitis: Secondary | ICD-10-CM | POA: Insufficient documentation

## 2017-03-16 DIAGNOSIS — Z981 Arthrodesis status: Secondary | ICD-10-CM | POA: Diagnosis not present

## 2017-03-16 DIAGNOSIS — Z888 Allergy status to other drugs, medicaments and biological substances status: Secondary | ICD-10-CM | POA: Insufficient documentation

## 2017-03-16 DIAGNOSIS — E785 Hyperlipidemia, unspecified: Secondary | ICD-10-CM | POA: Diagnosis not present

## 2017-03-16 HISTORY — PX: CERVICAL DISC ARTHROPLASTY: SHX587

## 2017-03-16 LAB — GLUCOSE, CAPILLARY
Glucose-Capillary: 189 mg/dL — ABNORMAL HIGH (ref 65–99)
Glucose-Capillary: 206 mg/dL — ABNORMAL HIGH (ref 65–99)
Glucose-Capillary: 200 mg/dL — ABNORMAL HIGH (ref 65–99)
Glucose-Capillary: 251 mg/dL — ABNORMAL HIGH (ref 65–99)
Glucose-Capillary: 179 mg/dL — ABNORMAL HIGH (ref 65–99)

## 2017-03-16 SURGERY — CERVICAL ANTERIOR DISC ARTHROPLASTY
Anesthesia: General | Site: Spine Cervical

## 2017-03-16 MED ORDER — SUGAMMADEX SODIUM 200 MG/2ML IV SOLN
INTRAVENOUS | Status: DC | PRN
  Administered 2017-03-16: 200 mg via INTRAVENOUS

## 2017-03-16 MED ORDER — BISACODYL 10 MG RE SUPP: 10.0000 mg | Freq: Every day | RECTAL | Status: DC | PRN

## 2017-03-16 MED ORDER — AMPHETAMINE-DEXTROAMPHETAMINE 10 MG PO TABS: 10.0000 mg | ORAL_TABLET | Freq: Two times a day (BID) | ORAL | Status: DC

## 2017-03-16 MED ORDER — HYDROXYZINE HCL 50 MG PO TABS: 50.0000 mg | ORAL_TABLET | ORAL | 0 refills | Status: DC | PRN

## 2017-03-16 MED ORDER — HYDROXYZINE HCL 25 MG PO TABS: 50.0000 mg | ORAL_TABLET | ORAL | Status: DC | PRN

## 2017-03-16 MED ORDER — CHLORHEXIDINE GLUCONATE CLOTH 2 % EX PADS: 6.0000 | MEDICATED_PAD | Freq: Once | CUTANEOUS | Status: DC

## 2017-03-16 MED ORDER — ONDANSETRON HCL 4 MG/2ML IJ SOLN
INTRAMUSCULAR | Status: DC | PRN
  Administered 2017-03-16: 4 mg via INTRAVENOUS

## 2017-03-16 MED ORDER — ACETAMINOPHEN 10 MG/ML IV SOLN
1000.0000 mg | Freq: Once | INTRAVENOUS | Status: AC
  Administered 2017-03-16: 1000 mg via INTRAVENOUS

## 2017-03-16 MED ORDER — SODIUM CHLORIDE 0.9 % IV SOLN: 250.0000 mL | INTRAVENOUS | Status: DC

## 2017-03-16 MED ORDER — SODIUM CHLORIDE 0.9% FLUSH: 3.0000 mL | Freq: Two times a day (BID) | INTRAVENOUS | Status: DC

## 2017-03-16 MED ORDER — CYCLOBENZAPRINE HCL 5 MG PO TABS: 5.0000 mg | ORAL_TABLET | Freq: Three times a day (TID) | ORAL | Status: DC | PRN

## 2017-03-16 MED ORDER — AMPHETAMINE-DEXTROAMPHETAMINE 5 MG PO TABS: 5.0000 mg | ORAL_TABLET | Freq: Every day | ORAL | Status: DC

## 2017-03-16 MED ORDER — ACETAMINOPHEN 650 MG RE SUPP: 650.0000 mg | RECTAL | Status: DC | PRN

## 2017-03-16 MED ORDER — BUPIVACAINE HCL (PF) 0.5 % IJ SOLN
INTRAMUSCULAR | Status: AC
  Filled 2017-03-16: qty 30

## 2017-03-16 MED ORDER — METFORMIN HCL 500 MG PO TABS: 1000.0000 mg | ORAL_TABLET | Freq: Every day | ORAL | Status: DC

## 2017-03-16 MED ORDER — ALUM & MAG HYDROXIDE-SIMETH 200-200-20 MG/5ML PO SUSP: 30.0000 mL | Freq: Four times a day (QID) | ORAL | Status: DC | PRN

## 2017-03-16 MED ORDER — 0.9 % SODIUM CHLORIDE (POUR BTL) OPTIME
TOPICAL | Status: DC | PRN
  Administered 2017-03-16: 1000 mL

## 2017-03-16 MED ORDER — ONDANSETRON HCL 4 MG/2ML IJ SOLN: 4.0000 mg | Freq: Four times a day (QID) | INTRAMUSCULAR | Status: DC | PRN

## 2017-03-16 MED ORDER — THROMBIN (RECOMBINANT) 5000 UNITS EX SOLR
OROMUCOSAL | Status: DC | PRN
  Administered 2017-03-16: 12:00:00 via TOPICAL

## 2017-03-16 MED ORDER — SIMVASTATIN 40 MG PO TABS
40.0000 mg | ORAL_TABLET | Freq: Every day | ORAL | Status: DC
  Administered 2017-03-16: 40 mg via ORAL
  Filled 2017-03-16: qty 1

## 2017-03-16 MED ORDER — PHENOL 1.4 % MT LIQD: 1.0000 | OROMUCOSAL | Status: DC | PRN

## 2017-03-16 MED ORDER — SUGAMMADEX SODIUM 200 MG/2ML IV SOLN
INTRAVENOUS | Status: AC
  Filled 2017-03-16: qty 2

## 2017-03-16 MED ORDER — HYDROXYZINE HCL 50 MG/ML IM SOLN: 50.0000 mg | INTRAMUSCULAR | Status: DC | PRN

## 2017-03-16 MED ORDER — ONDANSETRON HCL 4 MG PO TABS: 4.0000 mg | ORAL_TABLET | Freq: Four times a day (QID) | ORAL | Status: DC | PRN

## 2017-03-16 MED ORDER — LACTATED RINGERS IV SOLN
INTRAVENOUS | Status: DC | PRN
  Administered 2017-03-16 (×2): via INTRAVENOUS

## 2017-03-16 MED ORDER — SODIUM CHLORIDE 0.9% FLUSH: 3.0000 mL | INTRAVENOUS | Status: DC | PRN

## 2017-03-16 MED ORDER — LACTATED RINGERS IV SOLN
INTRAVENOUS | Status: DC
  Administered 2017-03-16: 11:00:00 via INTRAVENOUS

## 2017-03-16 MED ORDER — FLEET ENEMA 7-19 GM/118ML RE ENEM: 1.0000 | ENEMA | Freq: Once | RECTAL | Status: DC | PRN

## 2017-03-16 MED ORDER — PHENYLEPHRINE HCL 10 MG/ML IJ SOLN
INTRAVENOUS | Status: DC | PRN
  Administered 2017-03-16: 50 ug/min via INTRAVENOUS

## 2017-03-16 MED ORDER — ACETAMINOPHEN 325 MG PO TABS: 650.0000 mg | ORAL_TABLET | ORAL | Status: DC | PRN

## 2017-03-16 MED ORDER — MAGNESIUM HYDROXIDE 400 MG/5ML PO SUSP: 30.0000 mL | Freq: Every day | ORAL | Status: DC | PRN

## 2017-03-16 MED ORDER — LIDOCAINE 2% (20 MG/ML) 5 ML SYRINGE
INTRAMUSCULAR | Status: AC
  Filled 2017-03-16: qty 5

## 2017-03-16 MED ORDER — MIDAZOLAM HCL 2 MG/2ML IJ SOLN
INTRAMUSCULAR | Status: AC
  Filled 2017-03-16: qty 2

## 2017-03-16 MED ORDER — INSULIN PUMP
Freq: Three times a day (TID) | SUBCUTANEOUS | Status: DC
  Administered 2017-03-16: 6 via SUBCUTANEOUS
  Filled 2017-03-16: qty 1

## 2017-03-16 MED ORDER — SODIUM CHLORIDE 0.9 % IV SOLN: INTRAVENOUS | Status: DC

## 2017-03-16 MED ORDER — BUPIVACAINE HCL (PF) 0.5 % IJ SOLN
INTRAMUSCULAR | Status: DC | PRN
  Administered 2017-03-16: 8 mL

## 2017-03-16 MED ORDER — HYDROCODONE-ACETAMINOPHEN 5-325 MG PO TABS: 1.0000 | ORAL_TABLET | Freq: Four times a day (QID) | ORAL | 0 refills | Status: DC | PRN

## 2017-03-16 MED ORDER — DEXAMETHASONE SODIUM PHOSPHATE 10 MG/ML IJ SOLN
INTRAMUSCULAR | Status: DC | PRN
  Administered 2017-03-16: 4 mg via INTRAVENOUS

## 2017-03-16 MED ORDER — GENTAMICIN IN SALINE 1.6-0.9 MG/ML-% IV SOLN
80.0000 mg | INTRAVENOUS | Status: AC
  Administered 2017-03-16: 80 mg via INTRAVENOUS
  Filled 2017-03-16: qty 50

## 2017-03-16 MED ORDER — LIDOCAINE HCL (CARDIAC) 20 MG/ML IV SOLN
INTRAVENOUS | Status: DC | PRN
Start: 2017-03-16 — End: 2017-03-16
  Administered 2017-03-16: 60 mg via INTRAVENOUS
  Administered 2017-03-16: 40 mg via INTRAVENOUS

## 2017-03-16 MED ORDER — ACETAMINOPHEN 10 MG/ML IV SOLN
INTRAVENOUS | Status: AC
  Filled 2017-03-16: qty 100

## 2017-03-16 MED ORDER — LOSARTAN POTASSIUM 50 MG PO TABS
50.0000 mg | ORAL_TABLET | Freq: Every day | ORAL | Status: DC
  Administered 2017-03-16: 50 mg via ORAL
  Filled 2017-03-16: qty 1

## 2017-03-16 MED ORDER — LIDOCAINE-EPINEPHRINE 1 %-1:100000 IJ SOLN
INTRAMUSCULAR | Status: DC | PRN
  Administered 2017-03-16: 8 mL

## 2017-03-16 MED ORDER — LIDOCAINE-EPINEPHRINE 1 %-1:100000 IJ SOLN
INTRAMUSCULAR | Status: AC
  Filled 2017-03-16: qty 1

## 2017-03-16 MED ORDER — PROPOFOL 10 MG/ML IV BOLUS
INTRAVENOUS | Status: DC | PRN
  Administered 2017-03-16: 230 mg via INTRAVENOUS

## 2017-03-16 MED ORDER — KETOROLAC TROMETHAMINE 30 MG/ML IJ SOLN
30.0000 mg | Freq: Four times a day (QID) | INTRAMUSCULAR | Status: DC
  Administered 2017-03-16: 30 mg via INTRAVENOUS
  Filled 2017-03-16: qty 1

## 2017-03-16 MED ORDER — FENTANYL CITRATE (PF) 250 MCG/5ML IJ SOLN
INTRAMUSCULAR | Status: AC
  Filled 2017-03-16: qty 5

## 2017-03-16 MED ORDER — ROCURONIUM BROMIDE 100 MG/10ML IV SOLN
INTRAVENOUS | Status: DC | PRN
  Administered 2017-03-16: 50 mg via INTRAVENOUS

## 2017-03-16 MED ORDER — MIDAZOLAM HCL 5 MG/5ML IJ SOLN
INTRAMUSCULAR | Status: DC | PRN
  Administered 2017-03-16: 2 mg via INTRAVENOUS

## 2017-03-16 MED ORDER — HEMOSTATIC AGENTS (NO CHARGE) OPTIME
TOPICAL | Status: DC | PRN
  Administered 2017-03-16: 1 via TOPICAL

## 2017-03-16 MED ORDER — KETOROLAC TROMETHAMINE 30 MG/ML IJ SOLN
INTRAMUSCULAR | Status: AC
  Filled 2017-03-16: qty 1

## 2017-03-16 MED ORDER — SODIUM CHLORIDE 0.9 % IR SOLN
Status: DC | PRN
  Administered 2017-03-16: 12:00:00

## 2017-03-16 MED ORDER — FENTANYL CITRATE (PF) 100 MCG/2ML IJ SOLN
INTRAMUSCULAR | Status: DC | PRN
  Administered 2017-03-16: 150 ug via INTRAVENOUS
  Administered 2017-03-16 (×2): 50 ug via INTRAVENOUS

## 2017-03-16 MED ORDER — EPHEDRINE 5 MG/ML INJ
INTRAVENOUS | Status: AC
  Filled 2017-03-16: qty 10

## 2017-03-16 MED ORDER — EPHEDRINE SULFATE-NACL 50-0.9 MG/10ML-% IV SOSY
PREFILLED_SYRINGE | INTRAVENOUS | Status: DC | PRN
  Administered 2017-03-16: 10 mg via INTRAVENOUS
  Administered 2017-03-16: 5 mg via INTRAVENOUS

## 2017-03-16 MED ORDER — FENTANYL CITRATE (PF) 100 MCG/2ML IJ SOLN
25.0000 ug | INTRAMUSCULAR | Status: DC | PRN
  Administered 2017-03-16: 25 ug via INTRAVENOUS

## 2017-03-16 MED ORDER — THROMBIN (RECOMBINANT) 5000 UNITS EX SOLR
CUTANEOUS | Status: AC
  Filled 2017-03-16: qty 15000

## 2017-03-16 MED ORDER — MENTHOL 3 MG MT LOZG: 1.0000 | LOZENGE | OROMUCOSAL | Status: DC | PRN

## 2017-03-16 MED ORDER — PROPOFOL 10 MG/ML IV BOLUS
INTRAVENOUS | Status: AC
  Filled 2017-03-16: qty 20

## 2017-03-16 MED ORDER — KETOROLAC TROMETHAMINE 30 MG/ML IJ SOLN
30.0000 mg | Freq: Once | INTRAMUSCULAR | Status: AC
  Administered 2017-03-16: 30 mg via INTRAVENOUS

## 2017-03-16 MED ORDER — PANTOPRAZOLE SODIUM 40 MG PO TBEC: 40.0000 mg | DELAYED_RELEASE_TABLET | Freq: Every day | ORAL | Status: DC

## 2017-03-16 MED ORDER — FENTANYL CITRATE (PF) 100 MCG/2ML IJ SOLN
INTRAMUSCULAR | Status: AC
  Filled 2017-03-16: qty 2

## 2017-03-16 SURGICAL SUPPLY — 56 items
BAG DECANTER FOR FLEXI CONT (MISCELLANEOUS) ×2 IMPLANT
BIT CUTTER RAIL 1.5 (BIT) ×2 IMPLANT
BIT DRILL NEURO 2X3.1 SFT TUCH (MISCELLANEOUS) ×1 IMPLANT
BLADE ULTRA TIP 2M (BLADE) ×2 IMPLANT
CANISTER SUCT 3000ML PPV (MISCELLANEOUS) ×2 IMPLANT
CARTRIDGE OIL MAESTRO DRILL (MISCELLANEOUS) ×1 IMPLANT
DECANTER SPIKE VIAL GLASS SM (MISCELLANEOUS) ×2 IMPLANT
DERMABOND ADVANCED (GAUZE/BANDAGES/DRESSINGS) ×1
DERMABOND ADVANCED .7 DNX12 (GAUZE/BANDAGES/DRESSINGS) ×1 IMPLANT
DIFFUSER DRILL AIR PNEUMATIC (MISCELLANEOUS) ×2 IMPLANT
DISC CERVICAL PRESTIGE LP 7X18 (Neuro Prosthesis/Implant) ×2 IMPLANT
DRAPE C-ARM 42X72 X-RAY (DRAPES) ×4 IMPLANT
DRAPE HALF SHEET 40X57 (DRAPES) IMPLANT
DRAPE LAPAROTOMY 100X72 PEDS (DRAPES) ×2 IMPLANT
DRAPE MICROSCOPE LEICA (MISCELLANEOUS) ×2 IMPLANT
DRAPE POUCH INSTRU U-SHP 10X18 (DRAPES) ×2 IMPLANT
DRILL NEURO 2X3.1 SOFT TOUCH (MISCELLANEOUS) ×2
ELECT COATED BLADE 2.86 ST (ELECTRODE) ×2 IMPLANT
ELECT REM PT RETURN 9FT ADLT (ELECTROSURGICAL) ×2
ELECTRODE REM PT RTRN 9FT ADLT (ELECTROSURGICAL) ×1 IMPLANT
GAUZE SPONGE 4X4 16PLY XRAY LF (GAUZE/BANDAGES/DRESSINGS) ×2 IMPLANT
GLOVE BIO SURGEON STRL SZ8 (GLOVE) ×2 IMPLANT
GLOVE BIOGEL PI IND STRL 7.0 (GLOVE) ×1 IMPLANT
GLOVE BIOGEL PI IND STRL 7.5 (GLOVE) ×1 IMPLANT
GLOVE BIOGEL PI IND STRL 8 (GLOVE) ×1 IMPLANT
GLOVE BIOGEL PI INDICATOR 7.0 (GLOVE) ×1
GLOVE BIOGEL PI INDICATOR 7.5 (GLOVE) ×1
GLOVE BIOGEL PI INDICATOR 8 (GLOVE) ×1
GLOVE ECLIPSE 7.5 STRL STRAW (GLOVE) ×2 IMPLANT
GLOVE INDICATOR 8.5 STRL (GLOVE) ×4 IMPLANT
GLOVE SS N UNI LF 6.5 STRL (GLOVE) ×2 IMPLANT
GLOVE SS N UNI LF 7.0 STRL (GLOVE) ×2 IMPLANT
GOWN STRL REUS W/ TWL LRG LVL3 (GOWN DISPOSABLE) ×2 IMPLANT
GOWN STRL REUS W/ TWL XL LVL3 (GOWN DISPOSABLE) ×5 IMPLANT
GOWN STRL REUS W/TWL 2XL LVL3 (GOWN DISPOSABLE) IMPLANT
GOWN STRL REUS W/TWL LRG LVL3 (GOWN DISPOSABLE) ×2
GOWN STRL REUS W/TWL XL LVL3 (GOWN DISPOSABLE) ×5
HALTER HD/CHIN CERV TRACTION D (MISCELLANEOUS) ×2 IMPLANT
HEMOSTAT POWDER KIT SURGIFOAM (HEMOSTASIS) ×2 IMPLANT
KIT BASIN OR (CUSTOM PROCEDURE TRAY) ×2 IMPLANT
KIT ROOM TURNOVER OR (KITS) ×2 IMPLANT
NEEDLE HYPO 25X1 1.5 SAFETY (NEEDLE) ×2 IMPLANT
NEEDLE SPNL 22GX3.5 QUINCKE BK (NEEDLE) ×2 IMPLANT
NS IRRIG 1000ML POUR BTL (IV SOLUTION) ×2 IMPLANT
OIL CARTRIDGE MAESTRO DRILL (MISCELLANEOUS) ×2
PACK LAMINECTOMY NEURO (CUSTOM PROCEDURE TRAY) ×2 IMPLANT
PAD ARMBOARD 7.5X6 YLW CONV (MISCELLANEOUS) ×2 IMPLANT
RUBBERBAND STERILE (MISCELLANEOUS) IMPLANT
SPONGE INTESTINAL PEANUT (DISPOSABLE) ×4 IMPLANT
SPONGE SURGIFOAM ABS GEL SZ50 (HEMOSTASIS) ×2 IMPLANT
STAPLER SKIN PROX WIDE 3.9 (STAPLE) IMPLANT
SUT VIC AB 2-0 CP2 18 (SUTURE) ×2 IMPLANT
SUT VIC AB 3-0 SH 8-18 (SUTURE) ×4 IMPLANT
TOWEL GREEN STERILE (TOWEL DISPOSABLE) ×2 IMPLANT
TOWEL GREEN STERILE FF (TOWEL DISPOSABLE) ×2 IMPLANT
WATER STERILE IRR 1000ML POUR (IV SOLUTION) ×2 IMPLANT

## 2017-03-16 NOTE — Anesthesia Procedure Notes (Addendum)
Procedure Name: Intubation Date/Time: 03/16/2017 11:33 AM Performed by: Kyung Rudd Pre-anesthesia Checklist: Patient identified, Emergency Drugs available, Suction available and Patient being monitored Patient Re-evaluated:Patient Re-evaluated prior to induction Oxygen Delivery Method: Circle System Utilized Preoxygenation: Pre-oxygenation with 100% oxygen Induction Type: IV induction Ventilation: Mask ventilation without difficulty Laryngoscope Size: Glidescope and 4 Tube type: Oral Tube size: 7.5 mm Number of attempts: 1 Airway Equipment and Method: Stylet and LTA kit utilized Placement Confirmation: ETT inserted through vocal cords under direct vision,  positive ETCO2 and breath sounds checked- equal and bilateral Secured at: 23 cm Tube secured with: Tape Dental Injury: Teeth and Oropharynx as per pre-operative assessment  Comments: Intubation by Vickii Penna, SRNA

## 2017-03-16 NOTE — Progress Notes (Signed)
Orthopedic Tech Progress Note Patient Details:  Zachary Graham [birth year] [id number]  Ortho Devices Type of Ortho Device: Soft collar Ortho Device/Splint Location: neck Ortho Device/Splint Interventions: Ordered, Application   Braulio Bosch 03/16/2017, 2:49 PM

## 2017-03-16 NOTE — Transfer of Care (Signed)
Immediate Anesthesia Transfer of Care Note  Patient: Zachary Graham  Procedure(s) Performed: CERVICAL FIVE- CERVICAL SIX DISC ARTHROPLASTY (N/A Spine Cervical)  Patient Location: PACU  Anesthesia Type:General  Level of Consciousness: awake, alert  and oriented  Airway & Oxygen Therapy: Patient Spontanous Breathing and Patient connected to face mask oxygen  Post-op Assessment: Report given to RN, Post -op Vital signs reviewed and stable and Patient moving all extremities X 4  Post vital signs: Reviewed and stable  Last Vitals:  Vitals:   03/16/17 1020 03/16/17 1428  BP:    Pulse:    Resp:    Temp: 36.7 C 36.9 C  SpO2:      Last Pain:  Vitals:   03/16/17 1019  TempSrc:   PainSc: 3       Patients Stated Pain Goal: 3 (11/88/67 7373)  Complications: No apparent anesthesia complications

## 2017-03-16 NOTE — Progress Notes (Signed)
Discharged instructions/Education/Rx/AVS given to patient and verbalized understanding. Patient MAE well, swallowing well with no complaints. Pain is mild to moderate and controlled by PRN meds. Patient voiding and emptying bladder well. No swelling, no drainage, no redness noted on incision site. Patient d/ via wheelchair.

## 2017-03-16 NOTE — Discharge Summary (Signed)
Physician Discharge Summary  Patient ID: Zachary Graham MRN: [id number] DOB/AGE: 1960/06/12 56 y.o.  Admit date: 03/16/2017 Discharge date: 03/16/2017  Admission Diagnoses:  C5-6 cervical disc herniation, right cervical radiculopathy with right biceps weakness  Discharge Diagnoses:  C5-6 cervical disc herniation, right cervical radiculopathy with right biceps weakness Active Problems:   HNP (herniated nucleus pulposus), cervical   Discharged Condition: good  Hospital Course: Patient was admitted, he underwent a C5-6 disc arthroplasty.  Postoperative he has done well.  He is up and ambulating actively.  He is voiding well.  His incision is healing nicely.  He ate all of his dinner.  He is asking to be discharged home.  I have given him instructions regarding wound care and activities following discharge.  I have asked him to check with my secretary about a follow-up appoint with me in the office in 3-4 weeks.  Discharge Exam: Blood pressure (!) 148/83, pulse 80, temperature 97.7 F (36.5 C), resp. rate 16, height 5\' 10"  (1.778 m), weight 113.2 kg (249 lb 9.6 oz), SpO2 97 %.  Disposition: 01-Home or Self Care   Allergies as of 03/16/2017      Reactions   Lisinopril Swelling, Other (See Comments)   Extreme facial swelling causing hospitalization   Shellfish Allergy Anaphylaxis   Zetia [ezetimibe] Other (See Comments)   ABDOMINAL CRAMPING   Penicillins Other (See Comments)   UNSPECIFIED REACTION > Childhood allergy    Cymbalta [duloxetine Hcl] Other (See Comments)   Severe feet cramps      Medication List    TAKE these medications   acetaminophen 325 MG tablet Commonly known as:  TYLENOL Take 650 mg by mouth every 6 (six) hours as needed for mild pain.   Alpha-Lipoic Acid 600 MG Caps Take 1 capsule by mouth daily.   amphetamine-dextroamphetamine 10 MG tablet Commonly known as:  ADDERALL Take 1 tablet (10 mg total) by mouth 2 (two) times daily.    amphetamine-dextroamphetamine 5 MG tablet Commonly known as:  ADDERALL Take 1 tablet (5 mg total) by mouth daily.   aspirin EC 81 MG tablet Take 81 mg by mouth daily.   b complex vitamins tablet Take 1 tablet by mouth daily.   Biotin 10000 MCG Tabs Take 1 tablet by mouth daily.   CALCIUM 500 PO Take 1 tablet by mouth daily.   glucagon 1 MG injection Commonly known as:  GLUCAGON EMERGENCY Inject 1 mg into the muscle once as needed.   HUMALOG 100 UNIT/ML injection Generic drug:  insulin lispro Inject into the skin 3 (three) times daily before meals. Insulin pump   HYDROcodone-acetaminophen 5-325 MG tablet Commonly known as:  NORCO Take 1-2 tablets by mouth every 6 (six) hours as needed (pain).   hydrOXYzine 50 MG tablet Commonly known as:  ATARAX/VISTARIL Take 1 tablet (50 mg total) by mouth every 4 (four) hours as needed for nausea.   ibuprofen 200 MG tablet Commonly known as:  ADVIL,MOTRIN Take 400 mg by mouth every 6 (six) hours as needed for mild pain.   losartan 50 MG tablet Commonly known as:  COZAAR TAKE 1 TABLET BY MOUTH ONCE DAILY What changed:  See the new instructions.   Melatonin 5 MG Chew Chew 2 tablets by mouth at bedtime.   metFORMIN 500 MG tablet Commonly known as:  GLUCOPHAGE Take 2 tablets (1,000 mg total) by mouth daily with supper.   NON FORMULARY CPAP   pantoprazole 40 MG tablet Commonly known as:  PROTONIX TAKE 1 TABLET  BY MOUTH ONCE DAILY What changed:  See the new instructions.   QNASL 80 MCG/ACT Aers Generic drug:  Beclomethasone Dipropionate PLACE 4 PUFFS INTO THE NOSE DAILY. What changed:  how much to take  when to take this  reasons to take this   simvastatin 40 MG tablet Commonly known as:  ZOCOR Take 1 tablet (40 mg total) by mouth daily.   Turmeric Curcumin Caps Take 1 capsule by mouth daily.   vitamin C 1000 MG tablet Take 1,000 mg by mouth daily.   VITAMIN D-3 PO Take 1,000 mg by mouth daily.         SignedHosie Spangle 03/16/2017, 6:15 PM

## 2017-03-16 NOTE — Discharge Instructions (Signed)

## 2017-03-16 NOTE — Op Note (Signed)
03/16/2017  2:13 PM  PATIENT:  Zachary Graham  56 y.o. 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  ASSISTANTS: Kary Kos, M.D.  ANESTHESIA:   general  EBL:  Total I/O In: 1000 [I.V.:1000] Out: -  25cc  BLOOD ADMINISTERED:none  COUNT: Correct per nursing staff  DICTATION: Patient was brought to the operating room placed under general endotracheal anesthesia. Patient was placed in 10 pounds of halter traction. The neck was prepped with Betadine soap and solution and draped in a sterile fashion. A horizontal incision was made on the left side of the neck. The line of the incision was infiltrated with local anesthetic with epinephrine. Dissection was carried down thru the subcutaneous tissue and platysma, bipolar cautery was used to maintain hemostasis. Dissection was then carried down thru an avascular plane leaving the sternocleidomastoid carotid artery and jugular vein laterally and the trachea and esophagus medially. The ventral aspect of the vertebral column was identified and a localizing x-ray was taken. The C5-6 level was identified. The annulus was incised and the disc space entered. Discectomy was performed with micro-curettes and pituitary rongeurs. The operating microscope was draped and brought into the field provided additional magnification illumination and visualization. Discectomy was continued posteriorly thru the disc space. Posterior osteophytic overgrowth was removed using the high-speed drill along with a 2 mm thin footplated Kerrison punch. Posterior longitudinal ligament along with disc herniation was carefully removed, decompressing the spinal canal and thecal sac. We  then continued to remove osteophytic overgrowth and disc material decompressing the neural foramina and exiting nerve roots bilaterally. Once the decompression was completed hemostasis was established with the use of Surgifoam. Hemostasis was confirmed. The C-arm fluoroscope was draped and brought in the field to guide the placement of the disc arthroplasty implant, and was used throughout the arthroplasty portion of the procedure. We used a series of trials to find the proper implant size. We selected a 7 x 18 mm prestige LP cervical disc arthroplasty implant. The drill guide was passed over the sizer, and 4 tracks were drilled. The drill guide was removed, and the channel cutter was then passed over the sizer, and a pair of channels was cut into the endplates.  The channel cutter was first removed, and then the sizer. We then loaded the 7 x 18 mm prestige LP cervical disc arthroplasty implant, and it was gently tamped into position, taking care to properly position the superior aspect superiorly. It's position was again checked with the C-arm fluoroscopy unit, and the implant was found to be in good position in both a lateral and AP perspective. The wound was irrigated extensively with bacitracin solution checked for hemostasis which was established and confirmed. We then proceeded with closure. The platysma was closed with interrupted inverted 2-0 undyed Vicryl suture, the subcutaneous and subcuticular closed with interrupted inverted 3-0 undyed Vicryl suture. The skin edges were approximated with Dermabond. Following surgery the patient was taken out of cervical traction. To be reversed and the anesthetic and taken to the recovery room for further care.  PLAN OF CARE: Admit for overnight observation  PATIENT DISPOSITION:  PACU - hemodynamically stable.   Delay start of Pharmacological VTE agent (>24hrs) due to surgical blood loss or risk of bleeding:  yes

## 2017-03-16 NOTE — H&P (Signed)
Subjective: Patient is a 56 y.o. 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 Patient has been having persistent difficulties with right cervical radicular pain for the past 2-1/2 months. MRI scan revealed a right C5-6 cervical disc relation. He's been treated with epidural surgery injections without lasting relief.He is admitted now for a C5-6 cervical disc arthroplasty.   Patient Active Problem List   Diagnosis Date Noted  . 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 Herniation of cervical intervertebral disc with radiculopathy 01/04/2017  . Current mild episode of major depressive disorder without prior episode ([place]) 12/02/2016  . Chest tightness 12/02/2016  . Diabetic neuropathy ([place]) 03/01/2016  . Hammer toe, acquired 06/19/2015  . Obesity, Class II, BMI 35.0-39.9, with comorbidity (see actual BMI) 04/24/2015  . Type 1 diabetes mellitus with hyperglycemia, with long-term current use of insulin ([place]) 04/04/2015  . Patellofemoral stress syndrome of left knee 03/19/2015  . Allergic rhinitis due to pollen 11/14/2014  . Equinus deformity of foot, acquired 05/29/2014  . ADHD (attention deficit hyperactivity disorder), inattentive type 10/24/2013  . Vitamin D deficiency 02/28/2013  . Pulmonic stenosis 05/25/2012  . Pure hyperglyceridemia 05/25/2012  . Hyperlipidemia with target LDL less than 100 09/20/2011  . OSA (obstructive sleep apnea) 09/20/2011  . BPH (benign prostatic hyperplasia) 09/20/2011  . Routine general medical examination at a health care facility 09/20/2011   Past Medical History:  Diagnosis Date  . Diabetes mellitus   . GERD (gastroesophageal reflux disease)   . Heart murmur   . Hyperlipidemia   . Hypertension   . OSA (obstructive sleep apnea)   . Pneumonia     Past Surgical History:  Procedure Laterality Date  . COLONOSCOPY     . TONSILLECTOMY      Prescriptions Prior to Admission  Medication Sig Dispense Refill Last Dose  . acetaminophen (TYLENOL) 325 MG tablet Take 650 mg by mouth every 6 (six) hours as needed for mild pain.   03/15/2017 at Unknown time  . Alpha-Lipoic Acid 600 MG CAPS Take 1 capsule by mouth daily.    03/15/2017 at Unknown time  . amphetamine-dextroamphetamine (ADDERALL) 10 MG tablet Take 1 tablet (10 mg total) by mouth 2 (two) times daily. 60 tablet 0 Past Month at Unknown time  . amphetamine-dextroamphetamine (ADDERALL) 5 MG tablet Take 1 tablet (5 mg total) by mouth daily. 30 tablet 0 Past Month at Unknown time  . Ascorbic Acid (VITAMIN C) 1000 MG tablet Take 1,000 mg by mouth daily.   03/15/2017 at Unknown time  . aspirin EC 81 MG tablet Take 81 mg by mouth daily.   03/06/2017  . b complex vitamins tablet Take 1 tablet by mouth daily.   03/15/2017 at Unknown time  . Biotin 10000 MCG TABS Take 1 tablet by mouth daily.    Past Week at Unknown time  . Calcium-Magnesium-Vitamin D (CALCIUM 500 PO) Take 1 tablet by mouth daily.    03/15/2017 at Unknown time  . Cholecalciferol (VITAMIN D-3 PO) Take 1,000 mg by mouth daily.   03/15/2017 at Unknown time  . glucagon (GLUCAGON EMERGENCY) 1 MG injection Inject 1 mg into the muscle once as needed. 2 each prn Taking  . ibuprofen (ADVIL,MOTRIN) 200 MG tablet Take 400 mg by mouth every 6 (six) hours as needed for mild pain.   03/15/2017 at Unknown time  . insulin lispro (HUMALOG) 100 UNIT/ML injection Inject into the skin 3 (  three) times daily before meals. Insulin pump   03/16/2017 at Unknown time  . losartan (COZAAR) 50 MG tablet TAKE 1 TABLET BY MOUTH ONCE DAILY (Patient taking differently: TAKE 50 mg BY MOUTH ONCE DAILY) 90 tablet 1 03/15/2017 at Unknown time  . Melatonin 5 MG CHEW Chew 2 tablets by mouth at bedtime.   Past Week at Unknown time  . metFORMIN (GLUCOPHAGE) 500 MG tablet Take 2 tablets (1,000 mg total) by mouth daily with supper. 180 tablet 1  Past Week at Unknown time  . Misc Natural Products (TURMERIC CURCUMIN) CAPS Take 1 capsule by mouth daily.    03/15/2017 at Unknown time  . NON FORMULARY CPAP     . pantoprazole (PROTONIX) 40 MG tablet TAKE 1 TABLET BY MOUTH ONCE DAILY (Patient taking differently: TAKE 40 mg BY MOUTH ONCE DAILY) 90 tablet 3 03/16/2017 at 0700  . simvastatin (ZOCOR) 40 MG tablet Take 1 tablet (40 mg total) by mouth daily. 90 tablet 3 03/15/2017 at Unknown time  . QNASL 80 MCG/ACT AERS PLACE 4 PUFFS INTO THE NOSE DAILY. (Patient taking differently: Place 2 puffs into the nose daily as needed (allergies). ) 8.7 g 5 Unknown at Unknown time   Allergies  Allergen Reactions  . Lisinopril Swelling and Other (See Comments)    Extreme facial swelling causing hospitalization  . Shellfish Allergy Anaphylaxis  . Zetia [Ezetimibe] Other (See Comments)    ABDOMINAL CRAMPING  . Penicillins Other (See Comments)    UNSPECIFIED REACTION > Childhood allergy    . Cymbalta [Duloxetine Hcl] Other (See Comments)    Severe feet cramps    Social History  Substance Use Topics  . Smoking status: Never Smoker  . Smokeless tobacco: Never Used  . Alcohol use No    Family History  Problem Relation Age of Onset  . Colon cancer Maternal Grandfather   . Colon cancer Paternal Grandfather   . Heart disease Father        Valve replacement; pacemaker  . Alcohol abuse Neg Hx   . Asthma Neg Hx   . Diabetes Neg Hx   . Hyperlipidemia Neg Hx   . Hypertension Neg Hx   . Kidney disease Neg Hx   . Stroke Neg Hx      Review of Systems A comprehensive review of systems was negative.  Objective: Vital signs in last 24 hours: Temp:  [98 F (36.7 C)] 98 F (36.7 C) (10/31 1020) Pulse Rate:  [80] 80 (10/31 1005) Resp:  [18] 18 (10/31 1005) BP: (163)/(88) 163/88 (10/31 1005) SpO2:  [97 %] 97 % (10/31 1005) Weight:  [113.2 kg (249 lb 9.6 oz)] 113.2 kg (249 lb 9.6 oz) (10/31 1005)  EXAM: Patient well-developed well-nourished white  male in no acute distress. Lungs are clear to auscultation , the patient has symmetrical respiratory excursion. Heart has a regular rate and rhythm normal S1 and S2 no murmur.   Abdomen is soft nontender nondistended bowel sounds are present. Extremity examination shows no clubbing cyanosis or edema. Neurologic examination shows weakness of the right biceps 4/5, the remainder the operatory strength is 5/5 including the deltoid bilateral, the left biceps, and the triceps, intrinsics, and grips bilaterally. Sensation is intact pinprick in the digits of the upper extremities. Reflex examination shows loss of the right biceps and brachioradialis reflex, otherwise intact and symmetric reflexes.  He has a normal gait and stance.  Data Review:CBC    Component Value Date/Time   WBC 8.5 03/14/2017 0900  RBC 4.74 03/14/2017 0900   HGB 13.7 03/14/2017 0900   HCT 41.5 03/14/2017 0900   PLT 198 03/14/2017 0900   MCV 87.6 03/14/2017 0900   MCH 28.9 03/14/2017 0900   MCHC 33.0 03/14/2017 0900   RDW 14.5 03/14/2017 0900   LYMPHSABS 2.0 12/02/2016 1523   MONOABS 0.7 12/02/2016 1523   EOSABS 0.4 12/02/2016 1523   BASOSABS 0.1 12/02/2016 1523                          BMET    Component Value Date/Time   NA 138 03/14/2017 0900   K 4.6 03/14/2017 0900   CL 103 03/14/2017 0900   CO2 25 03/14/2017 0900   GLUCOSE 116 (H) 03/14/2017 0900   BUN 14 03/14/2017 0900   CREATININE 0.78 03/14/2017 0900   CALCIUM 9.3 03/14/2017 0900   GFRNONAA >60 03/14/2017 0900   GFRAA >60 03/14/2017 0900     Assessment/Plan: Patient with right cervical radiculopathy, with weakness of the right biceps, secondary to a right C5-6 cervical disc herniation. Patient is admitted now for a C5-6 cervical disc arthroplasty.  I've discussed with the patient the nature of his condition, the nature the surgical procedure, the typical length of surgery, hospital stay, and overall recuperation. We discussed limitations postoperatively. I  discussed risks of surgery including risks of infection, bleeding, possibly need for transfusion, the risk of nerve root dysfunction with pain, weakness, numbness, or paresthesias, the risk of spinal cord dysfunction with paralysis of all 4 limbs and quadriplegia, and the risk of dural tear and CSF leakage and possible need for further surgery, the risk of esophageal dysfunction causing dysphagia and the risk of laryngeal dysfunction causing hoarseness of the voice, and the risk of anesthetic complications including myocardial infarction, stroke, pneumonia, and death. We also discussed the need for postoperative immobilization in a cervical collar. Understanding all this the patient does wish to proceed with surgery and is admitted for such.    Hosie Spangle, MD 03/16/2017 11:06 AM

## 2017-03-16 NOTE — Anesthesia Preprocedure Evaluation (Addendum)
Anesthesia Evaluation  Patient identified by MRN, date of birth, ID band Patient awake    Reviewed: Allergy & Precautions, H&P , NPO status , Patient's Chart, lab work & pertinent test results, reviewed documented beta blocker date and time   Airway Mallampati: IV  TM Distance: >3 FB Neck ROM: full    Dental no notable dental hx. (+) Teeth Intact   Pulmonary sleep apnea ,    Pulmonary exam normal breath sounds clear to auscultation       Cardiovascular hypertension, On Medications  Rhythm:regular Rate:Normal     Neuro/Psych    GI/Hepatic   Endo/Other  diabetes, Type 2  Renal/GU      Musculoskeletal   Abdominal   Peds  Hematology   Anesthesia Other Findings EF at least 50% ( MUGA v Echo)  Reproductive/Obstetrics                          Anesthesia Physical Anesthesia Plan  ASA: III  Anesthesia Plan: General   Post-op Pain Management:    Induction: Intravenous  PONV Risk Score and Plan:   Airway Management Planned: Oral ETT and Video Laryngoscope Planned  Additional Equipment:   Intra-op Plan:   Post-operative Plan: Extubation in OR  Informed Consent: I have reviewed the patients History and Physical, chart, labs and discussed the procedure including the risks, benefits and alternatives for the proposed anesthesia with the patient or authorized representative who has indicated his/her understanding and acceptance.   Dental Advisory Given  Plan Discussed with: CRNA and Surgeon  Anesthesia Plan Comments: (  )       Anesthesia Quick Evaluation

## 2017-03-16 NOTE — Progress Notes (Signed)
Inpatient Diabetes Program Recommendations  AACE/ADA: New Consensus Statement on Inpatient Glycemic Control (2015)  Target Ranges:  Prepandial:   less than 140 mg/dL      Peak postprandial:   less than 180 mg/dL (1-2 hours)      Critically ill patients:  140 - 180 mg/dL   Lab Results  Component Value Date   GLUCAP 251 (H) 03/16/2017   HGBA1C 7.6 03/10/2017    Review of Glycemic ControlResults for NAZIRE, FRUTH (MRN [id number]) as of 03/16/2017 16:16  Ref. Range 03/16/2017 10:07 03/16/2017 11:13 03/16/2017 14:29 03/16/2017 15:34  Glucose-Capillary Latest Ref Range: 65 - 99 mg/dL 189 (H) 206 (H) 200 (H) 251 (H)    Diabetes history: Type 1.5 Diabetes Outpatient Diabetes medications: Insulin pump Pump settings: Per Dr. Arman Filter note on 01/24/17- uses 670G insulin pump - Basal rates: 12 AM to 2 AM: 3.0 8 AM: 3.10 12 PM: 2.2 4 PM: 2.8 10:30 PM: 2.7 - bolus: - insulin to carb ratio:  12 AM: 1.5 11 AM: 2.0 5 PM: 1.5  - ISF: 10 - target: 100-120 - IOB: 4h >> 3.0  TDD basal: 24% >> 29% >> 38% TDD bolus: 76% >> 71% >> 62% TDD 192 +/- 32 units >> 178  - Bolus wizard: On - Modified bolusing: no - Changes the pump site: every 1.6 days Current orders for Inpatient glycemic control:  Insulin pump  Inpatient Diabetes Program Recommendations:    Discussed with RN. Encouraged insulin pump order set.  Patient has insulin pump on.  No further recommendations.   Thanks Adah Perl, RN, BC-ADM Inpatient Diabetes Coordinator Pager [phone] (8a-5p)

## 2017-03-17 ENCOUNTER — Encounter (HOSPITAL_COMMUNITY): Payer: Self-pay | Admitting: Neurosurgery

## 2017-03-17 NOTE — Anesthesia Postprocedure Evaluation (Signed)
Anesthesia Post Note  Patient: Zachary Graham  Procedure(s) Performed: CERVICAL FIVE- CERVICAL SIX DISC ARTHROPLASTY (N/A Spine Cervical)     Patient location during evaluation: PACU Anesthesia Type: General Level of consciousness: awake and alert Pain management: pain level controlled Vital Signs Assessment: post-procedure vital signs reviewed and stable Respiratory status: spontaneous breathing, nonlabored ventilation, respiratory function stable and patient connected to nasal cannula oxygen Cardiovascular status: blood pressure returned to baseline and stable Postop Assessment: no apparent nausea or vomiting Anesthetic complications: no    Last Vitals:  Vitals:   03/16/17 1608 03/16/17 2000  BP: (!) 148/83 133/86  Pulse: 80 93  Resp: 16 20  Temp: 36.5 C 36.6 C  SpO2: 97% 96%    Last Pain:  Vitals:   03/16/17 2000  TempSrc: Oral  PainSc:                  Riccardo Dubin

## 2017-03-20 ENCOUNTER — Encounter: Payer: Self-pay | Admitting: Internal Medicine

## 2017-03-30 NOTE — Telephone Encounter (Signed)
Submitted PA request for [employer] device and sensor today, faxed to [place].

## 2017-04-04 ENCOUNTER — Telehealth: Payer: Self-pay | Admitting: Internal Medicine

## 2017-04-04 NOTE — Telephone Encounter (Signed)
[employer] (patient's pharmacy benefit) needs to know if patient's insulin treatment plan requires frequent adjustments of insulin dosing. Please call them at ph# [phone] to advise

## 2017-04-05 ENCOUNTER — Encounter: Payer: Self-pay | Admitting: Internal Medicine

## 2017-04-05 NOTE — Telephone Encounter (Signed)
Will call insurance and notify them that patient is on a pump.

## 2017-04-05 NOTE — Telephone Encounter (Signed)
Called patient regarding the information they needed. They started appeal for patient Zachary Graham system, since he is on pump and requires changes in insulin.  They will fax over any additional information they may need.

## 2017-04-05 NOTE — Telephone Encounter (Signed)
Called and had to leave a message for them to return phone call. Left call back number.

## 2017-04-11 DIAGNOSIS — Z9889 Other specified postprocedural states: Secondary | ICD-10-CM | POA: Diagnosis not present

## 2017-04-11 DIAGNOSIS — M5030 Other cervical disc degeneration, unspecified cervical region: Secondary | ICD-10-CM | POA: Diagnosis not present

## 2017-04-11 DIAGNOSIS — M5020 Other cervical disc displacement, unspecified cervical region: Secondary | ICD-10-CM | POA: Diagnosis not present

## 2017-04-11 DIAGNOSIS — M5412 Radiculopathy, cervical region: Secondary | ICD-10-CM | POA: Diagnosis not present

## 2017-04-13 ENCOUNTER — Other Ambulatory Visit: Payer: Self-pay | Admitting: *Deleted

## 2017-04-13 NOTE — Patient Outreach (Signed)
Spoke to [employer] via phone advising him that disease self-management services will be transitioned from the [facility] program to [facility] in 2019. Ensured that he completed the health risk assessment on the [email or web address] website. Also advised Jahaad that a letter will be mailed to the home residence with details of this transition. Will close case to [facility] diabetes program. Barrington Ellison RN,CCM,CDE [place] Coordinator [facility] and [employer] [phone] Office Fax [phone]

## 2017-04-19 ENCOUNTER — Telehealth: Payer: Self-pay

## 2017-04-19 NOTE — Telephone Encounter (Signed)
Starting an appeal for patients Free [employer].

## 2017-04-19 NOTE — Telephone Encounter (Signed)
Spoke with [employer] and they are faxing all information to me to start the appeal process.

## 2017-04-20 NOTE — Telephone Encounter (Signed)
Appeal documents faxed today with the reference numbers attached.

## 2017-04-21 NOTE — Telephone Encounter (Signed)
Received fax from [place] stating it would be 10-14 days before we received a response

## 2017-04-22 NOTE — Telephone Encounter (Signed)
Freestyle libre 10 and 14 day sensor has been approved and both are valid until 04/20/18

## 2017-04-26 NOTE — Progress Notes (Signed)
[name] [place],  [address] Phone: [phone] Subjective:    I'm seeing this patient by the request  of:    CC: Neck pain follow-up  FTD:[id number]  Zachary Graham is a 56 y.o. male coming in with complaint of neck pain.  Patient was having cervical radiculopathy with significant weakness.  Patient underwent a fusion of C5-C6 by Dr. Sherwood Gambler.  Patient states that he has some stiffness in his shoulders. His pain has gone away.  Patient would like to get back to working out and would like to return to running.  Patient would like some direction on what to do next.      Past Medical History:  Diagnosis Date  . Diabetes mellitus   . GERD (gastroesophageal reflux disease)   . Heart murmur   . Hyperlipidemia   . Hypertension   . OSA (obstructive sleep apnea)   . Pneumonia    Past Surgical History:  Procedure Laterality Date  . CERVICAL DISC ARTHROPLASTY N/A 03/16/2017   Procedure: CERVICAL FIVE- CERVICAL SIX [place] ARTHROPLASTY;  Surgeon: Jovita Gamma, MD;  Location: [facility];  Service: Neurosurgery;  Laterality: N/A;  CERVICAL 5- CERVICAL 6 DISC ARTHROPLASTY  . COLONOSCOPY    . TONSILLECTOMY     Social History   Socioeconomic History  . Marital status: Married    Spouse name: None  . Number of children: 3  . Years of education: None  . Highest education level: None  Social Needs  . Financial resource strain: None  . Food insecurity - worry: None  . Food insecurity - inability: None  . Transportation needs - medical: None  . Transportation needs - non-medical: None  Occupational History  . Occupation: RADIATION SAFETY OFFICER    Employer:   Tobacco Use  . Smoking status: Never Smoker  . Smokeless tobacco: Never Used  Substance and Sexual Activity  . Alcohol use: No    Alcohol/week: 0.0 oz  . Drug use: No  . Sexual activity: Yes    Partners: Female  Other Topics Concern  . None  Social History  Narrative   Regular exercise: runs 3 miles 3x week, takes one day off per week to rest   Caffeine use: stopped all diet soda on 04/23/15 per advice from Sports [facility]   Allergies  Allergen Reactions  . Lisinopril Swelling and Other (See Comments)    Extreme facial swelling causing hospitalization  . Shellfish Allergy Anaphylaxis  . Zetia [Ezetimibe] Other (See Comments)    ABDOMINAL CRAMPING  . Penicillins Other (See Comments)    UNSPECIFIED REACTION > Childhood allergy    . Cymbalta [Duloxetine Hcl] Other (See Comments)    Severe feet cramps   Family History  Problem Relation Age of Onset  . Colon cancer Maternal Grandfather   . Colon cancer Paternal Grandfather   . Heart disease Father        Valve replacement; pacemaker  . Alcohol abuse Neg Hx   . Asthma Neg Hx   . Diabetes Neg Hx   . Hyperlipidemia Neg Hx   . Hypertension Neg Hx   . Kidney disease Neg Hx   . Stroke Neg Hx      Past medical history, social, surgical and family history all reviewed in electronic medical record.  No pertanent information unless stated regarding to the chief complaint.   Review of Systems:Review of systems updated and as accurate as of 04/27/17  No headache, visual changes,  nausea, vomiting, diarrhea, constipation, dizziness, abdominal pain, skin rash, fevers, chills, night sweats, weight loss, swollen lymph nodes, body aches, joint swelling, muscle aches, chest pain, shortness of breath, mood changes.   Objective  Blood pressure 122/68, pulse 88, height 5\' 10"  (1.778 m), weight 256 lb (116.1 kg), SpO2 97 %. Systems examined below as of 04/27/17   General: No apparent distress alert and oriented x3 mood and affect normal, dressed appropriately.  HEENT: Pupils equal, extraocular movements intact  Respiratory: Patient's speak in full sentences and does not appear short of breath  Cardiovascular: No lower extremity edema, non tender, no erythema  Skin: Warm dry intact with no signs of infection  or rash on extremities or on axial skeleton.  Abdomen: Soft nontender  Neuro: Cranial nerves II through XII are intact, neurovascularly intact in all extremities with 2+ DTRs and 2+ pulses.  Lymph: No lymphadenopathy of posterior or anterior cervical chain or axillae bilaterally.  Gait normal with good balance and coordination.  MSK:  Non tender with full range of motion and good stability and symmetric strength and tone of shoulders, elbows, wrist, hip, knee and ankles bilaterally.  Neck exam shows the patient's incision well-healed.  Does have the.  Patient does not have limitation in forward flexion lacking the last 10 degrees as well as has only 5 degrees of extension at this time.  Negative Spurling's.  Strength is full and symmetric of the upper extremities    Impression and Recommendations:     This case required medical decision making of moderate complexity.      Note: This dictation was prepared with Dragon dictation along with smaller phrase technology. Any transcriptional errors that result from this process are unintentional.

## 2017-04-27 ENCOUNTER — Encounter: Payer: Self-pay | Admitting: Family Medicine

## 2017-04-27 ENCOUNTER — Ambulatory Visit (INDEPENDENT_AMBULATORY_CARE_PROVIDER_SITE_OTHER): Payer: 59 | Admitting: Family Medicine

## 2017-04-27 VITALS — BP 122/68 | HR 88 | Ht 70.0 in | Wt 256.0 lb

## 2017-04-27 DIAGNOSIS — M5020 Other cervical disc displacement, unspecified cervical region: Secondary | ICD-10-CM | POA: Diagnosis not present

## 2017-04-27 DIAGNOSIS — M542 Cervicalgia: Secondary | ICD-10-CM | POA: Diagnosis not present

## 2017-04-27 NOTE — Patient Instructions (Addendum)
Good to see you  Zachary Graham is your friend.  Keep hands within peripheral vision.  PT will call you  Machines are better then free weights if possible.  I think you will do great but no shoveling.  Biking (with handle bars very high) or elliptical (with no arm movement) for cardio See me again in 4 weeks and we will get you back to doing everything.

## 2017-04-27 NOTE — Assessment & Plan Note (Signed)
Patient is doing very well after surgical intervention.  Patient will restart with formal physical therapy.  We discussed icing regimen.  Patient has not taken any pain medications at this time.  We discussed no heavy lifting even though patient has already been shoveling snow.  Discussed that this will be likely another 6-12 weeks until we get rid of full restrictions.  Patient is following up with neurosurgeon again next month as well.

## 2017-04-28 MED FILL — FREESTYLE LIBRE SENSOR SYST: 90 days supply | Qty: 9 | Fill #0

## 2017-04-28 MED FILL — FREESTYLE LIBRE READER DEVI: 30 days supply | Qty: 1 | Fill #0

## 2017-05-06 MED FILL — CONTOUR NEXT STRIPS: 90 days supply | Qty: 600 | Fill #3

## 2017-05-13 MED FILL — NovoLOG 100 UNIT/ML SOLN: 100 | 24 days supply | Qty: 60 | Fill #4

## 2017-05-20 DIAGNOSIS — E109 Type 1 diabetes mellitus without complications: Secondary | ICD-10-CM | POA: Diagnosis not present

## 2017-05-20 DIAGNOSIS — Z6836 Body mass index (BMI) 36.0-36.9, adult: Secondary | ICD-10-CM | POA: Diagnosis not present

## 2017-05-20 DIAGNOSIS — Z794 Long term (current) use of insulin: Secondary | ICD-10-CM | POA: Diagnosis not present

## 2017-05-20 DIAGNOSIS — E7800 Pure hypercholesterolemia, unspecified: Secondary | ICD-10-CM | POA: Diagnosis not present

## 2017-05-20 DIAGNOSIS — E669 Obesity, unspecified: Secondary | ICD-10-CM | POA: Diagnosis not present

## 2017-05-20 DIAGNOSIS — Z713 Dietary counseling and surveillance: Secondary | ICD-10-CM | POA: Diagnosis not present

## 2017-06-01 ENCOUNTER — Ambulatory Visit: Payer: 59

## 2017-06-21 ENCOUNTER — Ambulatory Visit: Payer: 59

## 2017-06-27 DIAGNOSIS — E109 Type 1 diabetes mellitus without complications: Secondary | ICD-10-CM | POA: Diagnosis not present

## 2017-06-27 DIAGNOSIS — H3560 Retinal hemorrhage, unspecified eye: Secondary | ICD-10-CM | POA: Diagnosis not present

## 2017-06-27 DIAGNOSIS — Z794 Long term (current) use of insulin: Secondary | ICD-10-CM | POA: Diagnosis not present

## 2017-07-04 ENCOUNTER — Other Ambulatory Visit: Payer: Self-pay | Admitting: Internal Medicine

## 2017-07-04 DIAGNOSIS — F900 Attention-deficit hyperactivity disorder, predominantly inattentive type: Secondary | ICD-10-CM

## 2017-07-05 ENCOUNTER — Ambulatory Visit (INDEPENDENT_AMBULATORY_CARE_PROVIDER_SITE_OTHER): Payer: 59 | Admitting: Physical Therapy

## 2017-07-05 ENCOUNTER — Other Ambulatory Visit: Payer: Self-pay | Admitting: Internal Medicine

## 2017-07-05 DIAGNOSIS — M542 Cervicalgia: Secondary | ICD-10-CM

## 2017-07-05 DIAGNOSIS — F900 Attention-deficit hyperactivity disorder, predominantly inattentive type: Secondary | ICD-10-CM

## 2017-07-05 DIAGNOSIS — R293 Abnormal posture: Secondary | ICD-10-CM

## 2017-07-05 MED ORDER — AMPHETAMINE-DEXTROAMPHETAMINE 5 MG PO TABS: 5.0000 mg | ORAL_TABLET | Freq: Every day | ORAL | 0 refills | Status: DC

## 2017-07-05 MED ORDER — AMPHETAMINE-DEXTROAMPHETAMINE 10 MG PO TABS: 10.0000 mg | ORAL_TABLET | Freq: Two times a day (BID) | ORAL | 0 refills | Status: DC

## 2017-07-05 MED FILL — AMPHETAMINE-DEXTROAMPHETAMI: 10 | 30 days supply | Qty: 60 | Fill #0

## 2017-07-05 MED FILL — DEXTROAMP-AMPHETAMINE 5 MG: 5 | 30 days supply | Qty: 30 | Fill #0

## 2017-07-06 NOTE — Therapy (Signed)
[facility] [address] [place], [place], [address] Phone: [phone]   Fax:  [phone]  Physical Therapy Evaluation  Patient Details  Name: Zachary Graham MRN: [id number] Date of Birth: 17-Dec-1960 Referring Provider: [name]   Encounter Date: 07/05/2017  PT End of Session - 07/05/17 1642    Visit Number  1    Number of Visits  12    Date for PT Re-Evaluation  08/16/17    PT Start Time  3007    PT Stop Time  1644    PT Time Calculation (min)  50 min    Activity Tolerance  Patient tolerated treatment well    Behavior During Therapy  [facility] for tasks assessed/performed       Past Medical History:  Diagnosis Date  . Diabetes mellitus   . GERD (gastroesophageal reflux disease)   . Heart murmur   . Hyperlipidemia   . Hypertension   . OSA (obstructive sleep apnea)   . Pneumonia     Past Surgical History:  Procedure Laterality Date  . CERVICAL DISC ARTHROPLASTY N/A 03/16/2017   Procedure: CERVICAL FIVE- CERVICAL SIX [place] ARTHROPLASTY;  Surgeon: Jovita Gamma, MD;  Location: [place];  Service: Neurosurgery;  Laterality: N/A;  CERVICAL 5- CERVICAL 6 DISC ARTHROPLASTY  . COLONOSCOPY    . TONSILLECTOMY      There were no vitals filed for this visit.   Subjective Assessment - 07/05/17 1555    Subjective  Pt had cervical surgery on 03/16/17, he states good recovery of pain and UE tingling on R. He recently states increased soreness in shoulder L >R, and mild soreness in neck. He notices weakness in B hands, and stiffness in neck wiht rotation with driving. He is still working full time at hospital. He has no restrictions from surgery.     Limitations  House hold activities;Lifting    Patient Stated Goals  Decreased pain, stiffness     Currently in Pain?  Yes    Pain Score  2     Pain Location  Shoulder    Pain Orientation  Left;Right    Pain Descriptors / Indicators  Aching    Pain Type  Acute pain    Pain Onset  More than  a month ago    Pain Frequency  Intermittent    Aggravating Factors   Reaching, lifting, cervical ROM.          [facility] PT Assessment - 07/05/17 1703      Assessment   Medical Diagnosis  Neck Pain    Referring Provider  [name]    Hand Dominance  Right    Prior Therapy  Yes, prior to surgery      Precautions   Precautions  None      Restrictions   Weight Bearing Restrictions  No      Balance Screen   Has the patient fallen in the past 6 months  No      Prior Function   Level of Independence  Independent      Cognition   Overall Cognitive Status  Within Functional Limits for tasks assessed      Posture/Postural Control   Posture Comments  Poor posture, rounded shoulders, forward head,        AROM   Overall AROM Comments  Mild limitation for L shoulder flex, mild limitation for Bil ER    Cervical Flexion  wfl    Cervical Extension  wfl  Cervical - Right Side Bend  25    Cervical - Left Side Bend  20    Cervical - Right Rotation  62    Cervical - Left Rotation  62      Strength   Overall Strength Comments  L shoulder: 4-/5 ; R shoulder 4/5 ;  Grip: L: 55 lb; R: 75 lb;                Objective measurements completed on examination: See above findings.      [facility] Adult PT Treatment/Exercise - 07/05/17 1703      Neck Exercises: Seated   Neck Retraction  20 reps    Cervical Rotation  10 reps;Both    Shoulder Rolls  20 reps      Shoulder Exercises: Supine   Flexion  AAROM;20 reps    Flexion Limitations  cane      Shoulder Exercises: Seated   Row  20 reps             PT Education - 07/05/17 1635    Education provided  Yes    Education Details  HEP, posture     Person(s) Educated  Patient    Methods  Explanation;Demonstration;Tactile cues;Verbal cues;Handout    Comprehension  Verbalized understanding       PT Short Term Goals - 07/05/17 1647      PT SHORT TERM GOAL #1   Title  Pt to be independent with initial HEP     Time  2     Period  Weeks    Status  New    Target Date  07/19/17      PT SHORT TERM GOAL #2   Title  Pt to demo ability to self correct posture in clinic at least 75% of the time     Time  2    Period  Weeks    Status  New    Target Date  07/19/17        PT Long Term Goals - 07/05/17 1648      PT LONG TERM GOAL #1   Title  Pt to demo improved cervical and shoulder ROm, to be WNL, to improve ability for reaching, driving, and IADLs     Time  6    Period  Weeks    Status  New    Target Date  08/16/17      PT LONG TERM GOAL #2   Title  Pt to demo decreased pain in cervical and shoulder region to 0-1/10 with activity, to improve ability for work duties    Time  6    Period  Weeks    Status  New    Target Date  08/16/17      PT LONG TERM GOAL #3   Title  Pt to demo improved strength of Bil UE to at least 4+/5 to improve ability for lifting, reaching, and IADLs.              Plan - 07/05/17 1649    Clinical Impression Statement  Pt presents with increased pain and stiffness in cervical region and shoulder region, following Cervical surgery on 03/16/17. He has stiffness in cervical spine, and gleneral humeral joints bil L>R, that is limiting ROM in multiple directions. He has tightness of UT and surrounding cervical musculature. Pt with decreased strength of Bil UE, and grip strength, and difficulty with full AROM for shoulders. He has poor posture, with rounded shoulders, forward head, and stiff  upper body posture. Pts pain likely due to posture and muscle imbalance, from decresaed activity after surgery. He has decreased ability for functional activities, work duties, driving, and reaching, lifting , carrying, due to deficits. He will benefit from skilled PT to improve defiicts and return to PLOF without pain.     Clinical Presentation  Stable    Clinical Decision Making  Low    Rehab Potential  Good    PT Frequency  2x / week    PT Duration  6 weeks    PT Treatment/Interventions   ADLs/Self Care Home Management;Cryotherapy;Electrical Stimulation;Iontophoresis 4mg /ml Dexamethasone;Functional mobility training;Ultrasound;Moist Heat;Therapeutic activities;Therapeutic exercise;Neuromuscular re-education;Patient/family education;Passive range of motion;Manual techniques;Dry needling;Taping    Consulted and Agree with Plan of Care  Patient       Patient will benefit from skilled therapeutic intervention in order to improve the following deficits and impairments:  Decreased range of motion, Impaired UE functional use, Decreased activity tolerance, Pain, Impaired flexibility, Improper body mechanics, Hypomobility, Postural dysfunction, Decreased mobility  Visit Diagnosis: Cervicalgia  Abnormal posture     Problem List Patient Active Problem List   Diagnosis Date Noted  . HNP (herniated nucleus pulposus), cervical 03/16/2017  . 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 ([place]) 01/06/2017  . Herniation of cervical intervertebral disc with radiculopathy 01/04/2017  . Current mild episode of major depressive disorder without prior episode ([place]) 12/02/2016  . Chest tightness 12/02/2016  . Diabetic neuropathy ([place]) 03/01/2016  . Hammer toe, acquired 06/19/2015  . Obesity, Class II, BMI 35.0-39.9, with comorbidity (see actual BMI) 04/24/2015  . Type 1 diabetes mellitus with hyperglycemia, with long-term current use of insulin ([place]) 04/04/2015  . Patellofemoral stress syndrome of left knee 03/19/2015  . Allergic rhinitis due to pollen 11/14/2014  . Equinus deformity of foot, acquired 05/29/2014  . ADHD (attention deficit hyperactivity disorder), inattentive type 10/24/2013  . Vitamin D deficiency 02/28/2013  . Pulmonic stenosis 05/25/2012  . Pure hyperglyceridemia 05/25/2012  . Hyperlipidemia with target LDL less than 100 09/20/2011  . OSA (obstructive sleep apnea) 09/20/2011  . BPH (benign prostatic hyperplasia) 09/20/2011  .  Routine general medical examination at a health care facility 09/20/2011    Lyndee Hensen, PT, DPT 10:46 AM  07/06/17    [facility] [place], [place], [address] Phone: [phone]   Fax:  [phone]  Name: Zachary Graham MRN: [id number] Date of Birth: [birth year]

## 2017-07-11 ENCOUNTER — Other Ambulatory Visit: Payer: Self-pay | Admitting: Internal Medicine

## 2017-07-11 MED FILL — PANTOPRAZOLE SOD DR 40 MG T: 40 | 90 days supply | Qty: 90 | Fill #1

## 2017-07-14 ENCOUNTER — Ambulatory Visit (INDEPENDENT_AMBULATORY_CARE_PROVIDER_SITE_OTHER): Payer: 59 | Admitting: Physical Therapy

## 2017-07-14 ENCOUNTER — Encounter: Payer: Self-pay | Admitting: Physical Therapy

## 2017-07-14 DIAGNOSIS — M542 Cervicalgia: Secondary | ICD-10-CM

## 2017-07-14 DIAGNOSIS — R293 Abnormal posture: Secondary | ICD-10-CM | POA: Diagnosis not present

## 2017-07-14 NOTE — Therapy (Signed)
[facility] [address] [place], [place], [address] Phone: [phone]   Fax:  [phone]  Physical Therapy Treatment  Patient Details  Name: Zachary Graham MRN: [id number] Date of Birth: 08-09-1960 Referring Provider: [name]   Encounter Date: 07/14/2017  PT End of Session - 07/14/17 2136    Visit Number  2    Number of Visits  12    Date for PT Re-Evaluation  08/16/17    PT Start Time  0805    PT Stop Time  3235    PT Time Calculation (min)  50 min    Activity Tolerance  Patient tolerated treatment well    Behavior During Therapy  [facility] for tasks assessed/performed       Past Medical History:  Diagnosis Date  . Diabetes mellitus   . GERD (gastroesophageal reflux disease)   . Heart murmur   . Hyperlipidemia   . Hypertension   . OSA (obstructive sleep apnea)   . Pneumonia     Past Surgical History:  Procedure Laterality Date  . CERVICAL DISC ARTHROPLASTY N/A 03/16/2017   Procedure: CERVICAL FIVE- CERVICAL SIX [place] ARTHROPLASTY;  Surgeon: Jovita Gamma, MD;  Location: [place];  Service: Neurosurgery;  Laterality: N/A;  CERVICAL 5- CERVICAL 6 DISC ARTHROPLASTY  . COLONOSCOPY    . TONSILLECTOMY      There were no vitals filed for this visit.  Subjective Assessment - 07/14/17 2134    Subjective  Pt states discomfort in L UE, and stiffness in neck. He has missed a few appts due to work schedule     Currently in Pain?  Yes    Pain Score  2     Pain Location  Arm    Pain Orientation  Left    Pain Descriptors / Indicators  Aching    Pain Type  Acute pain    Pain Onset  More than a month ago    Pain Frequency  Intermittent                      [facility] Adult PT Treatment/Exercise - 07/14/17 0811      Posture/Postural Control   Posture Comments  Poor posture, rounded shoulders, forward head,        Neck Exercises: Seated   Neck Retraction  20 reps    Cervical Rotation  10 reps;Both    Shoulder Rolls   20 reps      Shoulder Exercises: Supine   Protraction  20 reps;Limitations    Protraction Limitations  SA punch    Flexion  AAROM;20 reps    Flexion Limitations  cane      Shoulder Exercises: Seated   Row  --    Other Seated Exercises  Posterior shoulder stretch 30 sec x3      Shoulder Exercises: Standing   Row  20 reps;Theraband    Theraband Level (Shoulder Row)  Level 2 (Red)      Shoulder Exercises: Pulleys   Flexion  2 minutes      Modalities   Modalities  Moist Heat      Moist Heat Therapy   Number Minutes Moist Heat  10 Minutes    Moist Heat Location  Cervical      Manual Therapy   Manual Therapy  Passive ROM;Soft tissue mobilization;Joint mobilization    Joint Mobilization  Posterior mobs for L shoulder     Soft tissue mobilization  L UT and cervical paraspinals  Passive ROM  All motions, cervical and L shoulder             PT Education - 07/14/17 2135    Education provided  Yes    Education Details  HEP    Person(s) Educated  Patient    Methods  Explanation;Verbal cues;Tactile cues    Comprehension  Need further instruction       PT Short Term Goals - 07/05/17 1647      PT SHORT TERM GOAL #1   Title  Pt to be independent with initial HEP     Time  2    Period  Weeks    Status  New    Target Date  07/19/17      PT SHORT TERM GOAL #2   Title  Pt to demo ability to self correct posture in clinic at least 75% of the time     Time  2    Period  Weeks    Status  New    Target Date  07/19/17        PT Long Term Goals - 07/05/17 1648      PT LONG TERM GOAL #1   Title  Pt to demo improved cervical and shoulder ROm, to be WNL, to improve ability for reaching, driving, and IADLs     Time  6    Period  Weeks    Status  New    Target Date  08/16/17      PT LONG TERM GOAL #2   Title  Pt to demo decreased pain in cervical and shoulder region to 0-1/10 with activity, to improve ability for work duties    Time  6    Period  Weeks    Status   New    Target Date  08/16/17      PT LONG TERM GOAL #3   Title  Pt to demo improved strength of Bil UE to at least 4+/5 to improve ability for lifting, reaching, and IADLs.             Plan - 07/14/17 2137    Clinical Impression Statement  Ther ex performed for posture and ROM, with cuing for proper mechanics. Pt with tightness and soreness in L UT today with STM, and will benefit from continued work on this. Plan to progress L UE strength as pain improves.     Rehab Potential  Good    PT Frequency  2x / week    PT Duration  6 weeks    PT Treatment/Interventions  ADLs/Self Care Home Management;Cryotherapy;Electrical Stimulation;Iontophoresis 4mg /ml Dexamethasone;Functional mobility training;Ultrasound;Moist Heat;Therapeutic activities;Therapeutic exercise;Neuromuscular re-education;Patient/family education;Passive range of motion;Manual techniques;Dry needling;Taping    Consulted and Agree with Plan of Care  Patient       Patient will benefit from skilled therapeutic intervention in order to improve the following deficits and impairments:  Decreased range of motion, Impaired UE functional use, Decreased activity tolerance, Pain, Impaired flexibility, Improper body mechanics, Hypomobility, Postural dysfunction, Decreased mobility  Visit Diagnosis: Cervicalgia  Abnormal posture     Problem List Patient Active Problem List   Diagnosis Date Noted  . HNP (herniated nucleus pulposus), cervical 03/16/2017  . 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 ([place]) 01/06/2017  . Herniation of cervical intervertebral disc with radiculopathy 01/04/2017  . Current mild episode of major depressive disorder without prior episode ([place]) 12/02/2016  . Chest tightness 12/02/2016  . Diabetic neuropathy ([place]) 03/01/2016  . Hammer toe, acquired  06/19/2015  . Obesity, Class II, BMI 35.0-39.9, with comorbidity (see actual BMI) 04/24/2015  . Type 1 diabetes  mellitus with hyperglycemia, with long-term current use of insulin ([place]) 04/04/2015  . Patellofemoral stress syndrome of left knee 03/19/2015  . Allergic rhinitis due to pollen 11/14/2014  . Equinus deformity of foot, acquired 05/29/2014  . ADHD (attention deficit hyperactivity disorder), inattentive type 10/24/2013  . Vitamin D deficiency 02/28/2013  . Pulmonic stenosis 05/25/2012  . Pure hyperglyceridemia 05/25/2012  . Hyperlipidemia with target LDL less than 100 09/20/2011  . OSA (obstructive sleep apnea) 09/20/2011  . BPH (benign prostatic hyperplasia) 09/20/2011  . Routine general medical examination at a health care facility 09/20/2011   Lyndee Hensen, PT, DPT 9:39 PM  07/14/17    [facility] [place], [place], [address] Phone: [phone]   Fax:  [phone]  Name: TYRIS ELIOT MRN: [id number] Date of Birth: February 14, 1961

## 2017-07-19 ENCOUNTER — Encounter: Payer: Self-pay | Admitting: Physical Therapy

## 2017-07-19 ENCOUNTER — Ambulatory Visit (INDEPENDENT_AMBULATORY_CARE_PROVIDER_SITE_OTHER): Payer: 59 | Admitting: Physical Therapy

## 2017-07-19 DIAGNOSIS — R293 Abnormal posture: Secondary | ICD-10-CM | POA: Diagnosis not present

## 2017-07-19 DIAGNOSIS — M542 Cervicalgia: Secondary | ICD-10-CM | POA: Diagnosis not present

## 2017-07-19 NOTE — Therapy (Signed)
[facility] [address] [place], [place], [address] Phone: [phone]   Fax:  [phone]  Physical Therapy Treatment  Patient Details  Name: Zachary Graham MRN: [id number] Date of Birth: May 16, 1961 Referring Provider: [name]   Encounter Date: 07/19/2017  PT End of Session - 07/19/17 1041    Visit Number  3    Number of Visits  12    Date for PT Re-Evaluation  08/16/17    PT Start Time  0812    PT Stop Time  0858    PT Time Calculation (min)  46 min    Activity Tolerance  Patient tolerated treatment well    Behavior During Therapy  [facility] for tasks assessed/performed       Past Medical History:  Diagnosis Date  . Diabetes mellitus   . GERD (gastroesophageal reflux disease)   . Heart murmur   . Hyperlipidemia   . Hypertension   . OSA (obstructive sleep apnea)   . Pneumonia     Past Surgical History:  Procedure Laterality Date  . CERVICAL DISC ARTHROPLASTY N/A 03/16/2017   Procedure: CERVICAL FIVE- CERVICAL SIX [place] ARTHROPLASTY;  Surgeon: Jovita Gamma, MD;  Location: [place];  Service: Neurosurgery;  Laterality: N/A;  CERVICAL 5- CERVICAL 6 DISC ARTHROPLASTY  . COLONOSCOPY    . TONSILLECTOMY      There were no vitals filed for this visit.  Subjective Assessment - 07/19/17 1040    Subjective  Pt states good dsys and bad days, but pain is intermiottent, and seems related to increased work and computer work. Pt 10 min late to appt today.     Currently in Pain?  Yes    Pain Score  2     Pain Location  Arm    Pain Orientation  Left    Pain Descriptors / Indicators  Aching    Pain Type  Acute pain    Pain Onset  More than a month ago    Pain Frequency  Intermittent                      [facility] Adult PT Treatment/Exercise - 07/19/17 0815      Posture/Postural Control   Posture Comments  Poor posture, rounded shoulders, forward head,        Neck Exercises: Seated   Neck Retraction  20 reps     Cervical Rotation  10 reps;Both    Shoulder Rolls  20 reps      Shoulder Exercises: Supine   Protraction  --    Protraction Limitations  --    Flexion  AAROM;20 reps    Flexion Limitations  cane      Shoulder Exercises: Seated   Other Seated Exercises  Posterior shoulder stretch 30 sec x3      Shoulder Exercises: Standing   Row  20 reps;Theraband    Theraband Level (Shoulder Row)  Level 3 (Green)      Shoulder Exercises: Pulleys   Flexion  3 minutes      Modalities   Modalities  Moist Heat      Moist Heat Therapy   Number Minutes Moist Heat  10 Minutes    Moist Heat Location  Cervical      Manual Therapy   Manual Therapy  Passive ROM;Soft tissue mobilization;Joint mobilization;Taping    Joint Mobilization  Posterior mobs for L shoulder     Soft tissue mobilization  L UT and cervical paraspinals  Passive ROM  All motions, cervical and L shoulder; Nerve glides for L UE.     Kinesiotex  Inhibit Muscle      Kinesiotix   Inhibit Muscle   L UT , 2 I strips             PT Education - 07/19/17 1041    Education provided  Yes    Education Details  Posture , k-tape precautions and use     Person(s) Educated  Patient    Methods  Explanation    Comprehension  Verbalized understanding;Need further instruction       PT Short Term Goals - 07/05/17 1647      PT SHORT TERM GOAL #1   Title  Pt to be independent with initial HEP     Time  2    Period  Weeks    Status  New    Target Date  07/19/17      PT SHORT TERM GOAL #2   Title  Pt to demo ability to self correct posture in clinic at least 75% of the time     Time  2    Period  Weeks    Status  New    Target Date  07/19/17        PT Long Term Goals - 07/05/17 1648      PT LONG TERM GOAL #1   Title  Pt to demo improved cervical and shoulder ROm, to be WNL, to improve ability for reaching, driving, and IADLs     Time  6    Period  Weeks    Status  New    Target Date  08/16/17      PT LONG TERM GOAL #2    Title  Pt to demo decreased pain in cervical and shoulder region to 0-1/10 with activity, to improve ability for work duties    Time  6    Period  Weeks    Status  New    Target Date  08/16/17      PT LONG TERM GOAL #3   Title  Pt to demo improved strength of Bil UE to at least 4+/5 to improve ability for lifting, reaching, and IADLs.             Plan - 07/19/17 1145    Clinical Impression Statement  Ther ex performed for posture and shoulder ROM. He states increased symptoms in UE intermittently during session. He has increased soreness and tightness in L UT and 1st rib region, addressed with manual therapy. Pt has difficulty maintaining proper posture , and will benefit from continued work on this.     Rehab Potential  Good    PT Frequency  2x / week    PT Duration  6 weeks    PT Treatment/Interventions  ADLs/Self Care Home Management;Cryotherapy;Electrical Stimulation;Iontophoresis 4mg /ml Dexamethasone;Functional mobility training;Ultrasound;Moist Heat;Therapeutic activities;Therapeutic exercise;Neuromuscular re-education;Patient/family education;Passive range of motion;Manual techniques;Dry needling;Taping    Consulted and Agree with Plan of Care  Patient       Patient will benefit from skilled therapeutic intervention in order to improve the following deficits and impairments:  Decreased range of motion, Impaired UE functional use, Decreased activity tolerance, Pain, Impaired flexibility, Improper body mechanics, Hypomobility, Postural dysfunction, Decreased mobility  Visit Diagnosis: Cervicalgia  Abnormal posture     Problem List Patient Active Problem List   Diagnosis Date Noted  . HNP (herniated nucleus pulposus), cervical 03/16/2017  . Mild nonproliferative diabetic retinopathy associated with type  1 diabetes mellitus ([place]) 01/06/2017  . Herniation of cervical intervertebral disc with radiculopathy 01/04/2017  . Current mild episode of major depressive disorder  without prior episode ([place]) 12/02/2016  . Chest tightness 12/02/2016  . Diabetic neuropathy ([place]) 03/01/2016  . Hammer toe, acquired 06/19/2015  . Obesity, Class II, BMI 35.0-39.9, with comorbidity (see actual BMI) 04/24/2015  . Type 1 diabetes mellitus with hyperglycemia, with long-term current use of insulin ([place]) 04/04/2015  . Patellofemoral stress syndrome of left knee 03/19/2015  . Allergic rhinitis due to pollen 11/14/2014  . Equinus deformity of foot, acquired 05/29/2014  . ADHD (attention deficit hyperactivity disorder), inattentive type 10/24/2013  . Vitamin D deficiency 02/28/2013  . Pulmonic stenosis 05/25/2012  . Pure hyperglyceridemia 05/25/2012  . Hyperlipidemia with target LDL less than 100 09/20/2011  . OSA (obstructive sleep apnea) 09/20/2011  . BPH (benign prostatic hyperplasia) 09/20/2011  . Routine general medical examination at a health care facility 09/20/2011   Lyndee Hensen, PT, DPT 11:49 AM  07/19/17    [facility] [place], [place], [address] Phone: [phone]   Fax:  [phone]  Name: EAMON TANTILLO MRN: [id number] Date of Birth: 30-Dec-1960

## 2017-07-26 ENCOUNTER — Encounter: Payer: Self-pay | Admitting: Physical Therapy

## 2017-07-26 ENCOUNTER — Ambulatory Visit (INDEPENDENT_AMBULATORY_CARE_PROVIDER_SITE_OTHER): Payer: 59 | Admitting: Physical Therapy

## 2017-07-26 DIAGNOSIS — R293 Abnormal posture: Secondary | ICD-10-CM

## 2017-07-26 DIAGNOSIS — M542 Cervicalgia: Secondary | ICD-10-CM

## 2017-07-26 NOTE — Therapy (Signed)
[facility] [address] [place], [place], [address] Phone: [phone]   Fax:  [phone]  Physical Therapy Treatment  Patient Details  Name: Zachary Graham MRN: [id number] Date of Birth: [birth year] Referring Provider: [name]   Encounter Date: 07/26/2017  PT End of Session - 07/26/17 1152    Visit Number  4    Number of Visits  12    Date for PT Re-Evaluation  08/16/17    PT Start Time  1132    PT Stop Time  1220    PT Time Calculation (min)  48 min    Activity Tolerance  Patient tolerated treatment well    Behavior During Therapy  [facility] for tasks assessed/performed       Past Medical History:  Diagnosis Date  . Diabetes mellitus   . GERD (gastroesophageal reflux disease)   . Heart murmur   . Hyperlipidemia   . Hypertension   . OSA (obstructive sleep apnea)   . Pneumonia     Past Surgical History:  Procedure Laterality Date  . CERVICAL DISC ARTHROPLASTY N/A 03/16/2017   Procedure: CERVICAL FIVE- CERVICAL SIX [place] ARTHROPLASTY;  Surgeon: Jovita Gamma, MD;  Location: [facility];  Service: Neurosurgery;  Laterality: N/A;  CERVICAL 5- CERVICAL 6 DISC ARTHROPLASTY  . COLONOSCOPY    . TONSILLECTOMY      There were no vitals filed for this visit.  Subjective Assessment - 07/26/17 1138    Subjective  Pt states mild improvements in pain. He has not had to take any pain meds or OTC meds.     Currently in Pain?  Yes    Pain Score  1     Pain Location  Arm    Pain Orientation  Left    Pain Descriptors / Indicators  Aching    Pain Type  Acute pain    Pain Onset  More than a month ago    Pain Frequency  Intermittent                      [facility] Adult PT Treatment/Exercise - 07/26/17 1154      Posture/Postural Control   Posture Comments  Poor posture, rounded shoulders, forward head,        Neck Exercises: Seated   Neck Retraction  20 reps    Cervical Rotation  10 reps;Both    Shoulder Rolls  20 reps    Other Seated Exercise  Thoracic rotation x20      Shoulder Exercises: Supine   Flexion  AAROM;20 reps    Flexion Limitations  2lb      Shoulder Exercises: Seated   Other Seated Exercises  --      Shoulder Exercises: Sidelying   ABduction  20 reps;Weights    ABduction Weight (lbs)  2    Other Sidelying Exercises  Horizontal Abd x20      Shoulder Exercises: Standing   Row  20 reps;Theraband    Theraband Level (Shoulder Row)  Level 3 (Green)    Other Standing Exercises  Bicep Curls GTB x20; Gripping x20;       Shoulder Exercises: Pulleys   Flexion  3 minutes      Modalities   Modalities  --      Moist Heat Therapy   Moist Heat Location  --      Manual Therapy   Manual Therapy  Passive ROM;Soft tissue mobilization;Joint mobilization;Taping    Joint Mobilization  Posterior mobs for  L shoulder     Soft tissue mobilization  L UT and cervical paraspinals    Passive ROM  All motions, cervical and L shoulder; Nerve glides for L UE.     Kinesiotex  --      Kinesiotix   Inhibit Muscle   --             PT Education - 07/26/17 1151    Education provided  Yes    Education Details  HEP, UE strength    Person(s) Educated  Patient    Methods  Explanation    Comprehension  Verbalized understanding       PT Short Term Goals - 07/05/17 1647      PT SHORT TERM GOAL #1   Title  Pt to be independent with initial HEP     Time  2    Period  Weeks    Status  New    Target Date  07/19/17      PT SHORT TERM GOAL #2   Title  Pt to demo ability to self correct posture in clinic at least 75% of the time     Time  2    Period  Weeks    Status  New    Target Date  07/19/17        PT Long Term Goals - 07/05/17 1648      PT LONG TERM GOAL #1   Title  Pt to demo improved cervical and shoulder ROm, to be WNL, to improve ability for reaching, driving, and IADLs     Time  6    Period  Weeks    Status  New    Target Date  08/16/17      PT LONG TERM GOAL #2   Title  Pt to  demo decreased pain in cervical and shoulder region to 0-1/10 with activity, to improve ability for work duties    Time  6    Period  Weeks    Status  New    Target Date  08/16/17      PT LONG TERM GOAL #3   Title  Pt to demo improved strength of Bil UE to at least 4+/5 to improve ability for lifting, reaching, and IADLs.             Plan - 07/26/17 1332    Clinical Impression Statement  Pt with improving pain levels with activity. He has improving ability for shoulder ROM, but does require cuing for posture with AROM. UE strengthening progressed today. He has significant stiffness in upper body with ambulation ,discussed posture today.     Rehab Potential  Good    PT Frequency  2x / week    PT Duration  6 weeks    PT Treatment/Interventions  ADLs/Self Care Home Management;Cryotherapy;Electrical Stimulation;Iontophoresis 4mg /ml Dexamethasone;Functional mobility training;Ultrasound;Moist Heat;Therapeutic activities;Therapeutic exercise;Neuromuscular re-education;Patient/family education;Passive range of motion;Manual techniques;Dry needling;Taping    Consulted and Agree with Plan of Care  Patient       Patient will benefit from skilled therapeutic intervention in order to improve the following deficits and impairments:  Decreased range of motion, Impaired UE functional use, Decreased activity tolerance, Pain, Impaired flexibility, Improper body mechanics, Hypomobility, Postural dysfunction, Decreased mobility  Visit Diagnosis: Cervicalgia  Abnormal posture     Problem List Patient Active Problem List   Diagnosis Date Noted  . HNP (herniated nucleus pulposus), cervical 03/16/2017  . 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 ([place]) 01/06/2017  .  Herniation of cervical intervertebral disc with radiculopathy 01/04/2017  . Current mild episode of major depressive disorder without prior episode ([place]) 12/02/2016  . Chest tightness 12/02/2016   . Diabetic neuropathy ([place]) 03/01/2016  . Hammer toe, acquired 06/19/2015  . Obesity, Class II, BMI 35.0-39.9, with comorbidity (see actual BMI) 04/24/2015  . Type 1 diabetes mellitus with hyperglycemia, with long-term current use of insulin ([place]) 04/04/2015  . Patellofemoral stress syndrome of left knee 03/19/2015  . Allergic rhinitis due to pollen 11/14/2014  . Equinus deformity of foot, acquired 05/29/2014  . ADHD (attention deficit hyperactivity disorder), inattentive type 10/24/2013  . Vitamin D deficiency 02/28/2013  . Pulmonic stenosis 05/25/2012  . Pure hyperglyceridemia 05/25/2012  . Hyperlipidemia with target LDL less than 100 09/20/2011  . OSA (obstructive sleep apnea) 09/20/2011  . BPH (benign prostatic hyperplasia) 09/20/2011  . Routine general medical examination at a health care facility 09/20/2011   Lyndee Hensen, PT, DPT 1:37 PM  07/26/17    [facility] [place], [place], [address] Phone: [phone]   Fax:  [phone]  Name: Zachary Graham MRN: [id number] Date of Birth: 1960-05-30

## 2017-08-02 ENCOUNTER — Encounter: Payer: Self-pay | Admitting: Physical Therapy

## 2017-08-02 ENCOUNTER — Ambulatory Visit (INDEPENDENT_AMBULATORY_CARE_PROVIDER_SITE_OTHER): Payer: 59 | Admitting: Physical Therapy

## 2017-08-02 DIAGNOSIS — R293 Abnormal posture: Secondary | ICD-10-CM | POA: Diagnosis not present

## 2017-08-02 DIAGNOSIS — M542 Cervicalgia: Secondary | ICD-10-CM

## 2017-08-02 NOTE — Therapy (Signed)
[facility] [address] [place], [place], [address] Phone: [phone]   Fax:  [phone]  Physical Therapy Treatment  Patient Details  Name: Zachary Graham MRN: [id number] Date of Birth: 29-Nov-1960 Referring Provider: [name]   Encounter Date: 08/02/2017  PT End of Session - 08/02/17 0820    Visit Number  5    Number of Visits  12    Date for PT Re-Evaluation  08/16/17    PT Start Time  0805    PT Stop Time  0845    PT Time Calculation (min)  40 min    Activity Tolerance  Patient tolerated treatment well    Behavior During Therapy  [facility] for tasks assessed/performed       Past Medical History:  Diagnosis Date  . Diabetes mellitus   . GERD (gastroesophageal reflux disease)   . Heart murmur   . Hyperlipidemia   . Hypertension   . OSA (obstructive sleep apnea)   . Pneumonia     Past Surgical History:  Procedure Laterality Date  . CERVICAL DISC ARTHROPLASTY N/A 03/16/2017   Procedure: CERVICAL FIVE- CERVICAL SIX [place] ARTHROPLASTY;  Surgeon: Jovita Gamma, MD;  Location: [facility];  Service: Neurosurgery;  Laterality: N/A;  CERVICAL 5- CERVICAL 6 DISC ARTHROPLASTY  . COLONOSCOPY    . TONSILLECTOMY      There were no vitals filed for this visit.  Subjective Assessment - 08/02/17 0816    Subjective  Pt states fatigue in the last 2 days, due to being tired from vacation, and had increased pain, moslty in L shoulder region.     Currently in Pain?  Yes    Pain Score  3     Pain Location  Arm    Pain Orientation  Left    Pain Descriptors / Indicators  Aching    Pain Type  Acute pain    Pain Onset  More than a month ago    Pain Frequency  Intermittent                      [facility] Adult PT Treatment/Exercise - 08/02/17 0821      Posture/Postural Control   Posture Comments  Poor posture, rounded shoulders, forward head,        Neck Exercises: Seated   Neck Retraction  20 reps    Cervical Rotation  10  reps;Both    Shoulder Rolls  --    Other Seated Exercise  --      Shoulder Exercises: Supine   Flexion  20 reps;AROM    Flexion Limitations  --    Other Supine Exercises  SA punch 2lb x20;       Shoulder Exercises: Sidelying   ABduction  20 reps;Weights    ABduction Weight (lbs)  2    Other Sidelying Exercises  --      Shoulder Exercises: Standing   ABduction  20 reps;Weights    Shoulder ABduction Weight (lbs)  1    Row  20 reps;Theraband    Theraband Level (Shoulder Row)  Level 3 (Green)    Other Standing Exercises  Bicep Curls GTB x20;       Shoulder Exercises: Pulleys   Flexion  3 minutes      Manual Therapy   Manual Therapy  Passive ROM;Soft tissue mobilization;Joint mobilization;Taping    Joint Mobilization  Posterior mobs for L shoulder     Soft tissue mobilization  L UT and cervical  paraspinals    Passive ROM  All motions, cervical and L shoulder; Nerve glides for L UE.              PT Education - 08/02/17 0817    Education provided  Yes    Education Details  HEP     Person(s) Educated  Patient    Methods  Explanation    Comprehension  Verbalized understanding       PT Short Term Goals - 07/05/17 1647      PT SHORT TERM GOAL #1   Title  Pt to be independent with initial HEP     Time  2    Period  Weeks    Status  New    Target Date  07/19/17      PT SHORT TERM GOAL #2   Title  Pt to demo ability to self correct posture in clinic at least 75% of the time     Time  2    Period  Weeks    Status  New    Target Date  07/19/17        PT Long Term Goals - 07/05/17 1648      PT LONG TERM GOAL #1   Title  Pt to demo improved cervical and shoulder ROm, to be WNL, to improve ability for reaching, driving, and IADLs     Time  6    Period  Weeks    Status  New    Target Date  08/16/17      PT LONG TERM GOAL #2   Title  Pt to demo decreased pain in cervical and shoulder region to 0-1/10 with activity, to improve ability for work duties    Time  6     Period  Weeks    Status  New    Target Date  08/16/17      PT LONG TERM GOAL #3   Title  Pt to demo improved strength of Bil UE to at least 4+/5 to improve ability for lifting, reaching, and IADLs.             Plan - 08/02/17 1210    Clinical Impression Statement  Pt with increased soreness with L shoulder elevation and ER ROM, UE pain seems most elicited by Shoulder activity. He does not demonstrate weakness or pain with resisted ER, but may likely be getting some shoulder impingement. Manual therapy done for shoulder ROM and pain today. Pt to benefit from continued strengthening of UE. Grip strength tested today, no changes since Eval.     Rehab Potential  Good    PT Frequency  2x / week    PT Duration  6 weeks    PT Treatment/Interventions  ADLs/Self Care Home Management;Cryotherapy;Electrical Stimulation;Iontophoresis 4mg /ml Dexamethasone;Functional mobility training;Ultrasound;Moist Heat;Therapeutic activities;Therapeutic exercise;Neuromuscular re-education;Patient/family education;Passive range of motion;Manual techniques;Dry needling;Taping    Consulted and Agree with Plan of Care  Patient       Patient will benefit from skilled therapeutic intervention in order to improve the following deficits and impairments:  Decreased range of motion, Impaired UE functional use, Decreased activity tolerance, Pain, Impaired flexibility, Improper body mechanics, Hypomobility, Postural dysfunction, Decreased mobility  Visit Diagnosis: Cervicalgia  Abnormal posture     Problem List Patient Active Problem List   Diagnosis Date Noted  . HNP (herniated nucleus pulposus), cervical 03/16/2017  . 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 ([place]) 01/06/2017  . Herniation of cervical intervertebral disc with radiculopathy 01/04/2017  .  Current mild episode of major depressive disorder without prior episode ([place]) 12/02/2016  . Chest tightness  12/02/2016  . Diabetic neuropathy ([place]) 03/01/2016  . Hammer toe, acquired 06/19/2015  . Obesity, Class II, BMI 35.0-39.9, with comorbidity (see actual BMI) 04/24/2015  . Type 1 diabetes mellitus with hyperglycemia, with long-term current use of insulin ([place]) 04/04/2015  . Patellofemoral stress syndrome of left knee 03/19/2015  . Allergic rhinitis due to pollen 11/14/2014  . Equinus deformity of foot, acquired 05/29/2014  . ADHD (attention deficit hyperactivity disorder), inattentive type 10/24/2013  . Vitamin D deficiency 02/28/2013  . Pulmonic stenosis 05/25/2012  . Pure hyperglyceridemia 05/25/2012  . Hyperlipidemia with target LDL less than 100 09/20/2011  . OSA (obstructive sleep apnea) 09/20/2011  . BPH (benign prostatic hyperplasia) 09/20/2011  . Routine general medical examination at a health care facility 09/20/2011     Lyndee Hensen, PT, DPT 12:15 PM  08/02/17    [facility] [place], [place], [address] Phone: [phone]   Fax:  [phone]  Name: Zachary Graham MRN: [id number] Date of Birth: 05-05-1961

## 2017-08-03 ENCOUNTER — Ambulatory Visit (INDEPENDENT_AMBULATORY_CARE_PROVIDER_SITE_OTHER): Payer: 59 | Admitting: Physical Therapy

## 2017-08-03 DIAGNOSIS — M542 Cervicalgia: Secondary | ICD-10-CM | POA: Diagnosis not present

## 2017-08-03 DIAGNOSIS — R293 Abnormal posture: Secondary | ICD-10-CM

## 2017-08-03 NOTE — Therapy (Signed)
[facility] [address] [place], [place], [address] Phone: [phone]   Fax:  [phone]  Physical Therapy Treatment  Patient Details  Name: Zachary Graham MRN: [id number] Date of Birth: 1960/09/28 Referring Provider: [name]   Encounter Date: 08/03/2017  PT End of Session - 08/03/17 0817    Visit Number  6    Number of Visits  12    Date for PT Re-Evaluation  08/16/17    PT Start Time  0801    PT Stop Time  0839    PT Time Calculation (min)  38 min    Activity Tolerance  Patient tolerated treatment well    Behavior During Therapy  [facility] for tasks assessed/performed       Past Medical History:  Diagnosis Date  . Diabetes mellitus   . GERD (gastroesophageal reflux disease)   . Heart murmur   . Hyperlipidemia   . Hypertension   . OSA (obstructive sleep apnea)   . Pneumonia     Past Surgical History:  Procedure Laterality Date  . CERVICAL DISC ARTHROPLASTY N/A 03/16/2017   Procedure: CERVICAL FIVE- CERVICAL SIX [place] ARTHROPLASTY;  Surgeon: Jovita Gamma, MD;  Location: [facility];  Service: Neurosurgery;  Laterality: N/A;  CERVICAL 5- CERVICAL 6 DISC ARTHROPLASTY  . COLONOSCOPY    . TONSILLECTOMY      There were no vitals filed for this visit.  Subjective Assessment - 08/03/17 0815    Subjective  Pt states he is very fatigued today, he is not sleeping well, due to much stress at work. Pain intensified by fatigue.     Currently in Pain?  Yes    Pain Score  3     Pain Location  Arm    Pain Orientation  Left    Pain Descriptors / Indicators  Aching    Pain Type  Acute pain    Pain Onset  More than a month ago    Pain Frequency  Intermittent                      [facility] Adult PT Treatment/Exercise - 08/03/17 1003      Posture/Postural Control   Posture Comments  Poor posture, rounded shoulders, forward head,        Shoulder Exercises: Supine   Flexion  20 reps;AAROM    Flexion Limitations  2lb with  cane      Shoulder Exercises: Sidelying   External Rotation  20 reps;Limitations    External Rotation Limitations  2lb    ABduction  20 reps;Weights    ABduction Weight (lbs)  2    Other Sidelying Exercises  Horizontal Abd x20      Shoulder Exercises: Standing   ABduction  --    Shoulder ABduction Weight (lbs)  --    Row  20 reps;Theraband    Theraband Level (Shoulder Row)  Level 3 (Green)    Other Standing Exercises  --      Shoulder Exercises: Pulleys   Flexion  3 minutes      Shoulder Exercises: Stretch   Corner Stretch  3 reps;30 seconds      Modalities   Modalities  Moist Heat      Moist Heat Therapy   Number Minutes Moist Heat  10 Minutes    Moist Heat Location  Cervical      Manual Therapy   Manual Therapy  Passive ROM;Soft tissue mobilization;Joint mobilization;Taping    Joint Mobilization  Posterior mobs for L shoulder     Soft tissue mobilization  L UT and cervical paraspinals    Passive ROM  All motions, for L shoulder             PT Education - 08/03/17 0817    Education provided  Yes    Education Details  HEP, importance of letting body to rest    Person(s) Educated  Patient    Methods  Explanation    Comprehension  Verbalized understanding       PT Short Term Goals - 07/05/17 1647      PT SHORT TERM GOAL #1   Title  Pt to be independent with initial HEP     Time  2    Period  Weeks    Status  New    Target Date  07/19/17      PT SHORT TERM GOAL #2   Title  Pt to demo ability to self correct posture in clinic at least 75% of the time     Time  2    Period  Weeks    Status  New    Target Date  07/19/17        PT Long Term Goals - 07/05/17 1648      PT LONG TERM GOAL #1   Title  Pt to demo improved cervical and shoulder ROm, to be WNL, to improve ability for reaching, driving, and IADLs     Time  6    Period  Weeks    Status  New    Target Date  08/16/17      PT LONG TERM GOAL #2   Title  Pt to demo decreased pain in cervical  and shoulder region to 0-1/10 with activity, to improve ability for work duties    Time  6    Period  Weeks    Status  New    Target Date  08/16/17      PT LONG TERM GOAL #3   Title  Pt to demo improved strength of Bil UE to at least 4+/5 to improve ability for lifting, reaching, and IADLs.             Plan - 08/03/17 1001    Clinical Impression Statement  Pt with mild improvements in L shoulder ROM today. Shoulder strengthening added today, no increased pain with strengthening, but has soreness at end range of PROM for ER. L UT trigger points decreased from previous sessions. Pt educated on continuing to be mindful of posture. Pt with improving pain overall, but pain is variable depending on activity and fatigue .     Rehab Potential  Good    PT Frequency  2x / week    PT Duration  6 weeks    PT Treatment/Interventions  ADLs/Self Care Home Management;Cryotherapy;Electrical Stimulation;Iontophoresis 4mg /ml Dexamethasone;Functional mobility training;Ultrasound;Moist Heat;Therapeutic activities;Therapeutic exercise;Neuromuscular re-education;Patient/family education;Passive range of motion;Manual techniques;Dry needling;Taping    Consulted and Agree with Plan of Care  Patient       Patient will benefit from skilled therapeutic intervention in order to improve the following deficits and impairments:  Decreased range of motion, Impaired UE functional use, Decreased activity tolerance, Pain, Impaired flexibility, Improper body mechanics, Hypomobility, Postural dysfunction, Decreased mobility  Visit Diagnosis: Cervicalgia  Abnormal posture     Problem List Patient Active Problem List   Diagnosis Date Noted  . HNP (herniated nucleus pulposus), cervical 03/16/2017  . Mild nonproliferative diabetic retinopathy associated with type 1 diabetes  mellitus ([place]) 01/06/2017  . Herniation of cervical intervertebral disc with radiculopathy 01/04/2017  . Current mild episode of major  depressive disorder without prior episode ([place]) 12/02/2016  . Chest tightness 12/02/2016  . Diabetic neuropathy ([place]) 03/01/2016  . Hammer toe, acquired 06/19/2015  . Obesity, Class II, BMI 35.0-39.9, with comorbidity (see actual BMI) 04/24/2015  . Type 1 diabetes mellitus with hyperglycemia, with long-term current use of insulin ([place]) 04/04/2015  . Patellofemoral stress syndrome of left knee 03/19/2015  . Allergic rhinitis due to pollen 11/14/2014  . Equinus deformity of foot, acquired 05/29/2014  . ADHD (attention deficit hyperactivity disorder), inattentive type 10/24/2013  . Vitamin D deficiency 02/28/2013  . Pulmonic stenosis 05/25/2012  . Pure hyperglyceridemia 05/25/2012  . Hyperlipidemia with target LDL less than 100 09/20/2011  . OSA (obstructive sleep apnea) 09/20/2011  . BPH (benign prostatic hyperplasia) 09/20/2011  . Routine general medical examination at a health care facility 09/20/2011    Lyndee Hensen, PT, DPT 10:06 AM  08/03/17    [facility] [place], [place], [address] Phone: [phone]   Fax:  [phone]  Name: Zachary Graham MRN: [id number] Date of Birth: 02-27-1961

## 2017-08-04 MED FILL — CONTOUR NEXT STRIPS: 66 days supply | Qty: 400 | Fill #0

## 2017-08-09 ENCOUNTER — Encounter: Payer: Self-pay | Admitting: Physical Therapy

## 2017-08-09 ENCOUNTER — Ambulatory Visit (INDEPENDENT_AMBULATORY_CARE_PROVIDER_SITE_OTHER): Payer: 59 | Admitting: Physical Therapy

## 2017-08-09 DIAGNOSIS — M542 Cervicalgia: Secondary | ICD-10-CM

## 2017-08-09 DIAGNOSIS — R293 Abnormal posture: Secondary | ICD-10-CM | POA: Diagnosis not present

## 2017-08-09 DIAGNOSIS — M5412 Radiculopathy, cervical region: Secondary | ICD-10-CM | POA: Diagnosis not present

## 2017-08-09 NOTE — Therapy (Signed)
[facility] [address] [place], [place], [address] Phone: [phone]   Fax:  [phone]  Physical Therapy Treatment  Patient Details  Name: Zachary Graham MRN: [id number] Date of Birth: [birth year] Referring Provider: [name]   Encounter Date: 08/09/2017  PT End of Session - 08/09/17 1105    Visit Number  7    Number of Visits  12    Date for PT Re-Evaluation  08/16/17    PT Start Time  0804    PT Stop Time  0844    PT Time Calculation (min)  40 min    Activity Tolerance  Patient tolerated treatment well    Behavior During Therapy  [facility] for tasks assessed/performed       Past Medical History:  Diagnosis Date  . Diabetes mellitus   . GERD (gastroesophageal reflux disease)   . Heart murmur   . Hyperlipidemia   . Hypertension   . OSA (obstructive sleep apnea)   . Pneumonia     Past Surgical History:  Procedure Laterality Date  . CERVICAL DISC ARTHROPLASTY N/A 03/16/2017   Procedure: CERVICAL FIVE- CERVICAL SIX [place] ARTHROPLASTY;  Surgeon: Jovita Gamma, MD;  Location: [facility];  Service: Neurosurgery;  Laterality: N/A;  CERVICAL 5- CERVICAL 6 DISC ARTHROPLASTY  . COLONOSCOPY    . TONSILLECTOMY      There were no vitals filed for this visit.  Subjective Assessment - 08/09/17 1104    Subjective  Pt states that neck and arm pain has been improved in last few days. He also has been sleeping a bit better.     Currently in Pain?  Yes    Pain Score  1     Pain Location  Arm    Pain Orientation  Left    Pain Descriptors / Indicators  Aching    Pain Type  Acute pain    Pain Onset  More than a month ago    Pain Frequency  Intermittent                No data recorded       [facility] Adult PT Treatment/Exercise - 08/09/17 0807      Posture/Postural Control   Posture Comments  Poor posture, rounded shoulders, forward head,        Shoulder Exercises: Supine   Horizontal ABduction  15 reps    Flexion  20  reps;AAROM    Flexion Limitations  2lb with cane      Shoulder Exercises: Sidelying   External Rotation  20 reps;Limitations    External Rotation Limitations  2lb    ABduction  20 reps;Weights    ABduction Weight (lbs)  2    Other Sidelying Exercises  Horizontal Abd x20      Shoulder Exercises: Standing   Flexion  20 reps;Weights    Shoulder Flexion Weight (lbs)  2    ABduction  20 reps;Weights    Shoulder ABduction Weight (lbs)  2    Row  20 reps;Theraband    Theraband Level (Shoulder Row)  Level 3 (Green)    Other Standing Exercises  Wall slides x20 L UE;       Shoulder Exercises: Pulleys   Flexion  3 minutes      Shoulder Exercises: Stretch   Corner Stretch  3 reps;30 seconds      Modalities   Modalities  Moist Heat      Moist Heat Therapy   Moist Heat Location  --  Manual Therapy   Manual Therapy  Passive ROM;Soft tissue mobilization;Joint mobilization;Taping    Joint Mobilization  Posterior and Inferior mobs for L shoulder     Soft tissue mobilization  --    Passive ROM  All motions, for L shoulder             PT Education - 08/09/17 1105    Education provided  Yes    Education Details  HEP , shoulder ROM.     Person(s) Educated  Patient    Methods  Explanation    Comprehension  Verbalized understanding       PT Short Term Goals - 07/05/17 1647      PT SHORT TERM GOAL #1   Title  Pt to be independent with initial HEP     Time  2    Period  Weeks    Status  New    Target Date  07/19/17      PT SHORT TERM GOAL #2   Title  Pt to demo ability to self correct posture in clinic at least 75% of the time     Time  2    Period  Weeks    Status  New    Target Date  07/19/17        PT Long Term Goals - 07/05/17 1648      PT LONG TERM GOAL #1   Title  Pt to demo improved cervical and shoulder ROm, to be WNL, to improve ability for reaching, driving, and IADLs     Time  6    Period  Weeks    Status  New    Target Date  08/16/17      PT  LONG TERM GOAL #2   Title  Pt to demo decreased pain in cervical and shoulder region to 0-1/10 with activity, to improve ability for work duties    Time  6    Period  Weeks    Status  New    Target Date  08/16/17      PT LONG TERM GOAL #3   Title  Pt to demo improved strength of Bil UE to at least 4+/5 to improve ability for lifting, reaching, and IADLs.             Plan - 08/09/17 1106    Clinical Impression Statement  Pt with improvements in shoulder ROM, increased range and decreased pain at end range for flexion. Pt to benefit from continued work on mobility and strengthening for shoulder, as well as postural awareness and strengthening.     Rehab Potential  Good    PT Frequency  2x / week    PT Duration  6 weeks    PT Treatment/Interventions  ADLs/Self Care Home Management;Cryotherapy;Electrical Stimulation;Iontophoresis 4mg /ml Dexamethasone;Functional mobility training;Ultrasound;Moist Heat;Therapeutic activities;Therapeutic exercise;Neuromuscular re-education;Patient/family education;Passive range of motion;Manual techniques;Dry needling;Taping    Consulted and Agree with Plan of Care  Patient       Patient will benefit from skilled therapeutic intervention in order to improve the following deficits and impairments:  Decreased range of motion, Impaired UE functional use, Decreased activity tolerance, Pain, Impaired flexibility, Improper body mechanics, Hypomobility, Postural dysfunction, Decreased mobility  Visit Diagnosis: Cervicalgia  Abnormal posture  Radiculopathy, cervical region     Problem List Patient Active Problem List   Diagnosis Date Noted  . HNP (herniated nucleus pulposus), cervical 03/16/2017  . 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 ([place]) 01/06/2017  . Herniation of  cervical intervertebral disc with radiculopathy 01/04/2017  . Current mild episode of major depressive disorder without prior episode ([place])  12/02/2016  . Chest tightness 12/02/2016  . Diabetic neuropathy ([place]) 03/01/2016  . Hammer toe, acquired 06/19/2015  . Obesity, Class II, BMI 35.0-39.9, with comorbidity (see actual BMI) 04/24/2015  . Type 1 diabetes mellitus with hyperglycemia, with long-term current use of insulin ([place]) 04/04/2015  . Patellofemoral stress syndrome of left knee 03/19/2015  . Allergic rhinitis due to pollen 11/14/2014  . Equinus deformity of foot, acquired 05/29/2014  . ADHD (attention deficit hyperactivity disorder), inattentive type 10/24/2013  . Vitamin D deficiency 02/28/2013  . Pulmonic stenosis 05/25/2012  . Pure hyperglyceridemia 05/25/2012  . Hyperlipidemia with target LDL less than 100 09/20/2011  . OSA (obstructive sleep apnea) 09/20/2011  . BPH (benign prostatic hyperplasia) 09/20/2011  . Routine general medical examination at a health care facility 09/20/2011   Lyndee Hensen, PT, DPT 11:10 AM  08/09/17    [facility] [place], [place], [address] Phone: [phone]   Fax:  [phone]  Name: Zachary Graham MRN: [id number] Date of Birth: [birth year]

## 2017-08-16 ENCOUNTER — Ambulatory Visit (INDEPENDENT_AMBULATORY_CARE_PROVIDER_SITE_OTHER): Payer: 59 | Admitting: Physical Therapy

## 2017-08-16 ENCOUNTER — Encounter: Payer: Self-pay | Admitting: Physical Therapy

## 2017-08-16 DIAGNOSIS — M542 Cervicalgia: Secondary | ICD-10-CM

## 2017-08-16 DIAGNOSIS — R293 Abnormal posture: Secondary | ICD-10-CM | POA: Diagnosis not present

## 2017-08-16 NOTE — Therapy (Signed)
[facility] [address] [place], [place], [address] Phone: [phone]   Fax:  [phone]  Physical Therapy Treatment/ReCert   Patient Details  Name: Zachary Graham MRN: [id number] Date of Birth: 01/10/1961 Referring Provider: [name]   Encounter Date: 08/16/2017  PT End of Session - 08/16/17 0809    Visit Number  8    Number of Visits  14    Date for PT Re-Evaluation  09/02/17    PT Start Time  0804    PT Stop Time  0844    PT Time Calculation (min)  40 min    Activity Tolerance  Patient tolerated treatment well    Behavior During Therapy  [facility] for tasks assessed/performed       Past Medical History:  Diagnosis Date  . Diabetes mellitus   . GERD (gastroesophageal reflux disease)   . Heart murmur   . Hyperlipidemia   . Hypertension   . OSA (obstructive sleep apnea)   . Pneumonia     Past Surgical History:  Procedure Laterality Date  . CERVICAL DISC ARTHROPLASTY N/A 03/16/2017   Procedure: CERVICAL FIVE- CERVICAL SIX [place] ARTHROPLASTY;  Surgeon: Jovita Gamma, MD;  Location: [facility];  Service: Neurosurgery;  Laterality: N/A;  CERVICAL 5- CERVICAL 6 DISC ARTHROPLASTY  . COLONOSCOPY    . TONSILLECTOMY      There were no vitals filed for this visit.  Subjective Assessment - 08/16/17 1123    Subjective  Pt states that overall he has improved pain. He definitely still feels that his L UE is weaker than R. He gets increased soreness at end of long work day, and with increased activity, like mowing lawn over the weekend. He has been doing HEP, and is interested in returning to [place].     Currently in Pain?  Yes    Pain Score  2     Pain Location  Shoulder    Pain Orientation  Right    Pain Descriptors / Indicators  Aching    Pain Type  Acute pain    Pain Onset  More than a month ago    Pain Frequency  Intermittent         [facility] PT Assessment - 08/16/17 0001      Strength   Overall Strength Comments  L shoulder: 4/5  to 4+/5  ; R shoulder 4+/5 ;  Grip: L: 60 lb; R: 80 lb;                      [facility] Adult PT Treatment/Exercise - 08/16/17 0827      Posture/Postural Control   Posture Comments  Poor posture, rounded shoulders, forward head,        Shoulder Exercises: Supine   Horizontal ABduction  20 reps;Limitations    Horizontal ABduction Limitations  2lb    Flexion  20 reps;AROM    Flexion Limitations  2 lb      Shoulder Exercises: Sidelying   External Rotation  --    External Rotation Limitations  --    ABduction  20 reps;Weights    ABduction Weight (lbs)  2    Other Sidelying Exercises  Horizontal Abd x20      Shoulder Exercises: Standing   External Rotation  20 reps;Theraband    Theraband Level (Shoulder External Rotation)  Level 3 (Green)    Internal Rotation  20 reps;Theraband    Theraband Level (Shoulder Internal Rotation)  Level 3 (Green)  Flexion  20 reps;Weights    Shoulder Flexion Weight (lbs)  2    ABduction  20 reps;Weights    Shoulder ABduction Weight (lbs)  2    Row  20 reps;Theraband    Theraband Level (Shoulder Row)  Level 3 (Green)    Other Standing Exercises  Wall slides x20 L UE;       Shoulder Exercises: Pulleys   Flexion  2 minutes      Shoulder Exercises: Stretch   Corner Stretch  --      Modalities   Modalities  --      Manual Therapy   Manual Therapy  Passive ROM;Soft tissue mobilization;Joint mobilization;Taping    Joint Mobilization  Posterior and Inferior mobs for L shoulder     Passive ROM  All motions, for L shoulder             PT Education - 08/16/17 1125    Education provided  Yes    Education Details  Gym activities to avoid with UE. , PT plan     Person(s) Educated  Patient    Methods  Explanation;Verbal cues    Comprehension  Verbalized understanding;Need further instruction       PT Short Term Goals - 08/16/17 0813      PT SHORT TERM GOAL #1   Title  Pt to be independent with initial HEP     Time  2    Period   Weeks    Status  Achieved      PT SHORT TERM GOAL #2   Title  Pt to demo ability to self correct posture in clinic at least 75% of the time     Time  2    Period  Weeks    Status  Achieved        PT Long Term Goals - 08/16/17 1126      PT LONG TERM GOAL #1   Title  Pt to demo improved cervical and shoulder ROm, to be WNL, to improve ability for reaching, driving, and IADLs     Time  6    Period  Weeks    Status  Partially Met      PT LONG TERM GOAL #2   Title  Pt to demo decreased pain in cervical and shoulder region to 0-1/10 with activity, to improve ability for work duties    Time  6    Period  Weeks    Status  Partially Met      PT LONG TERM GOAL #3   Title  Pt to demo improved strength of Bil UE to at least 4+/5 to improve ability for lifting, reaching, and IADLs.     Status  Partially Met            Plan - 08/16/17 1126    Clinical Impression Statement  Pt with improving pain levels. He does have increased soreness with increased activity and with increased fatigue, but improved from previously. He continues to have weakness in L UE vs R. Shoulder strength is improving, but grip strength continues to be quite a bit weaker than R side. Pt has been responding well to shoulder ROM, stretching, and strengthening in last few sessions. He has diffiuclty maintaining proper shoulder and neck posture with activities, and has been educated on importance of movement mechanics. Discussed plan with pt today, he will benefit from continuation of skilled PT for 2 more weeks. IF he does not have significant pain relief, or  if weakness increases or changes in UE, he will call MD for follow up appt.  Pt likely getting increased pain from shoulder impingement and poor posture, however there is some concern for weakness with grip.     Rehab Potential  Good    PT Frequency  2x / week    PT Duration  3 weeks    PT Treatment/Interventions  ADLs/Self Care Home  Management;Cryotherapy;Electrical Stimulation;Iontophoresis 15m/ml Dexamethasone;Functional mobility training;Ultrasound;Moist Heat;Therapeutic activities;Therapeutic exercise;Neuromuscular re-education;Patient/family education;Passive range of motion;Manual techniques;Dry needling;Taping    PT Next Visit Plan  Teach final HEP for UE strength     Consulted and Agree with Plan of Care  Patient       Patient will benefit from skilled therapeutic intervention in order to improve the following deficits and impairments:  Decreased range of motion, Impaired UE functional use, Decreased activity tolerance, Pain, Impaired flexibility, Improper body mechanics, Hypomobility, Postural dysfunction, Decreased mobility  Visit Diagnosis: Cervicalgia  Abnormal posture     Problem List Patient Active Problem List   Diagnosis Date Noted  . HNP (herniated nucleus pulposus), cervical 03/16/2017  . 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 ([place] 01/06/2017  . Herniation of cervical intervertebral disc with radiculopathy 01/04/2017  . Current mild episode of major depressive disorder without prior episode ([facility] 12/02/2016  . Chest tightness 12/02/2016  . Diabetic neuropathy ([place] 03/01/2016  . Hammer toe, acquired 06/19/2015  . Obesity, Class II, BMI 35.0-39.9, with comorbidity (see actual BMI) 04/24/2015  . Type 1 diabetes mellitus with hyperglycemia, with long-term current use of insulin ([facility] 04/04/2015  . Patellofemoral stress syndrome of left knee 03/19/2015  . Allergic rhinitis due to pollen 11/14/2014  . Equinus deformity of foot, acquired 05/29/2014  . ADHD (attention deficit hyperactivity disorder), inattentive type 10/24/2013  . Vitamin D deficiency 02/28/2013  . Pulmonic stenosis 05/25/2012  . Pure hyperglyceridemia 05/25/2012  . Hyperlipidemia with target LDL less than 100 09/20/2011  . OSA (obstructive sleep apnea) 09/20/2011  . BPH (benign  prostatic hyperplasia) 09/20/2011  . Routine general medical examination at a health care facility 09/20/2011   LLyndee Hensen PT, DPT 11:56 AM  08/16/17    [facility] [address]Phone: [phone]  Fax:  [phone] Name: Zachary GILARDIMRN: [id number] of Birth: 108/28/1962

## 2017-08-18 ENCOUNTER — Encounter: Payer: Self-pay | Admitting: Physical Therapy

## 2017-08-18 ENCOUNTER — Ambulatory Visit (INDEPENDENT_AMBULATORY_CARE_PROVIDER_SITE_OTHER): Payer: 59 | Admitting: Physical Therapy

## 2017-08-18 DIAGNOSIS — R293 Abnormal posture: Secondary | ICD-10-CM

## 2017-08-18 DIAGNOSIS — M542 Cervicalgia: Secondary | ICD-10-CM | POA: Diagnosis not present

## 2017-08-18 NOTE — Therapy (Signed)
[facility] [address] [place], [place], [address] Phone: [phone]   Fax:  [phone]  Physical Therapy Treatment  Patient Details  Name: Zachary Graham MRN: [id number] Date of Birth: [birth year] Referring Provider: [name]   Encounter Date: 08/18/2017  PT End of Session - 08/18/17 0830    Visit Number  9    Number of Visits  14    Date for PT Re-Evaluation  09/02/17    PT Start Time  0804    PT Stop Time  0845    PT Time Calculation (min)  41 min    Activity Tolerance  Patient tolerated treatment well    Behavior During Therapy  [facility] for tasks assessed/performed       Past Medical History:  Diagnosis Date  . Diabetes mellitus   . GERD (gastroesophageal reflux disease)   . Heart murmur   . Hyperlipidemia   . Hypertension   . OSA (obstructive sleep apnea)   . Pneumonia     Past Surgical History:  Procedure Laterality Date  . CERVICAL DISC ARTHROPLASTY N/A 03/16/2017   Procedure: CERVICAL FIVE- CERVICAL SIX [place] ARTHROPLASTY;  Surgeon: Jovita Gamma, MD;  Location: [facility];  Service: Neurosurgery;  Laterality: N/A;  CERVICAL 5- CERVICAL 6 DISC ARTHROPLASTY  . COLONOSCOPY    . TONSILLECTOMY      There were no vitals filed for this visit.  Subjective Assessment - 08/18/17 0825    Subjective  Pt states minimal pain in shoulder. Has been able to do most activities without pain, but does have a "twinge here and there"     Currently in Pain?  Yes    Pain Score  1     Pain Location  Shoulder    Pain Orientation  Left    Pain Descriptors / Indicators  Aching    Pain Type  Acute pain    Pain Onset  More than a month ago    Pain Frequency  Intermittent         [facility] PT Assessment - 08/18/17 0830      Posture/Postural Control   Posture Comments  Poor posture, rounded shoulders, forward head,                     [facility] Adult PT Treatment/Exercise - 08/18/17 0830      Shoulder Exercises: Supine   Horizontal ABduction  20 reps;Limitations    Horizontal ABduction Limitations  2lb    Flexion  20 reps;AROM    Flexion Limitations  2 lb      Shoulder Exercises: Sidelying   ABduction  20 reps;Weights    ABduction Weight (lbs)  2    Other Sidelying Exercises  Horizontal Abd x20      Shoulder Exercises: Standing   External Rotation  20 reps;Theraband    Theraband Level (Shoulder External Rotation)  Level 3 (Green)    Internal Rotation  20 reps;Theraband    Theraband Level (Shoulder Internal Rotation)  Level 3 (Green)    Flexion  --    Shoulder Flexion Weight (lbs)  --    ABduction  --    Shoulder ABduction Weight (lbs)  --    Row  20 reps;Theraband    Theraband Level (Shoulder Row)  Level 3 (Green)    Other Standing Exercises  Wall slides x20 L UE;       Shoulder Exercises: Pulleys   Flexion  2 minutes      Manual  Therapy   Manual Therapy  Passive ROM;Soft tissue mobilization;Joint mobilization;Taping    Joint Mobilization  Posterior and Inferior mobs for L shoulder     Passive ROM  All motions, for L shoulder, L UT stretch             PT Education - 08/18/17 0829    Education provided  Yes    Education Details  Education on final HEP     Person(s) Educated  Patient    Methods  Explanation;Handout;Verbal cues;Tactile cues;Demonstration    Comprehension  Verbalized understanding       PT Short Term Goals - 08/16/17 0813      PT SHORT TERM GOAL #1   Title  Pt to be independent with initial HEP     Time  2    Period  Weeks    Status  Achieved      PT SHORT TERM GOAL #2   Title  Pt to demo ability to self correct posture in clinic at least 75% of the time     Time  2    Period  Weeks    Status  Achieved        PT Long Term Goals - 08/16/17 1126      PT LONG TERM GOAL #1   Title  Pt to demo improved cervical and shoulder ROm, to be WNL, to improve ability for reaching, driving, and IADLs     Time  6    Period  Weeks    Status  Partially Met      PT  LONG TERM GOAL #2   Title  Pt to demo decreased pain in cervical and shoulder region to 0-1/10 with activity, to improve ability for work duties    Time  6    Period  Weeks    Status  Partially Met      PT LONG TERM GOAL #3   Title  Pt to demo improved strength of Bil UE to at least 4+/5 to improve ability for lifting, reaching, and IADLs.     Status  Partially Met            Plan - 08/18/17 1109    Clinical Impression Statement  Pt challenged wtih UE strengthening, but showing improvement in ability for reps, weight, mechanics. L UE noteably weakner than R. Pt to benefit from continued strenghtening and education on final HEP. Pt to be seen at decrearsed frequency, 1x/wk , plan to d/c in next 1-2 weeks.     Rehab Potential  Good    PT Frequency  2x / week    PT Duration  3 weeks    PT Treatment/Interventions  ADLs/Self Care Home Management;Cryotherapy;Electrical Stimulation;Iontophoresis 33m/ml Dexamethasone;Functional mobility training;Ultrasound;Moist Heat;Therapeutic activities;Therapeutic exercise;Neuromuscular re-education;Patient/family education;Passive range of motion;Manual techniques;Dry needling;Taping    PT Next Visit Plan  Teach final HEP for UE strength     Consulted and Agree with Plan of Care  Patient       Patient will benefit from skilled therapeutic intervention in order to improve the following deficits and impairments:  Decreased range of motion, Impaired UE functional use, Decreased activity tolerance, Pain, Impaired flexibility, Improper body mechanics, Hypomobility, Postural dysfunction, Decreased mobility  Visit Diagnosis: Cervicalgia  Abnormal posture     Problem List Patient Active Problem List   Diagnosis Date Noted  . HNP (herniated nucleus pulposus), cervical 03/16/2017  . 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 ([place] 01/06/2017  . Herniation of  cervical intervertebral disc with radiculopathy  01/04/2017  . Current mild episode of major depressive disorder without prior episode ([place]) 12/02/2016  . Chest tightness 12/02/2016  . Diabetic neuropathy ([place]) 03/01/2016  . Hammer toe, acquired 06/19/2015  . Obesity, Class II, BMI 35.0-39.9, with comorbidity (see actual BMI) 04/24/2015  . Type 1 diabetes mellitus with hyperglycemia, with long-term current use of insulin ([place]) 04/04/2015  . Patellofemoral stress syndrome of left knee 03/19/2015  . Allergic rhinitis due to pollen 11/14/2014  . Equinus deformity of foot, acquired 05/29/2014  . ADHD (attention deficit hyperactivity disorder), inattentive type 10/24/2013  . Vitamin D deficiency 02/28/2013  . Pulmonic stenosis 05/25/2012  . Pure hyperglyceridemia 05/25/2012  . Hyperlipidemia with target LDL less than 100 09/20/2011  . OSA (obstructive sleep apnea) 09/20/2011  . BPH (benign prostatic hyperplasia) 09/20/2011  . Routine general medical examination at a health care facility 09/20/2011   Lyndee Hensen, PT, DPT 11:18 AM  08/18/17    [facility] [place], [place], [address] Phone: [phone]   Fax:  [phone]  Name: Zachary Graham MRN: [id number] Date of Birth: Sep 26, 1960

## 2017-08-19 DIAGNOSIS — E7800 Pure hypercholesterolemia, unspecified: Secondary | ICD-10-CM | POA: Diagnosis not present

## 2017-08-19 DIAGNOSIS — E109 Type 1 diabetes mellitus without complications: Secondary | ICD-10-CM | POA: Diagnosis not present

## 2017-08-23 ENCOUNTER — Encounter: Payer: Self-pay | Admitting: Physical Therapy

## 2017-08-23 ENCOUNTER — Ambulatory Visit (INDEPENDENT_AMBULATORY_CARE_PROVIDER_SITE_OTHER): Payer: 59 | Admitting: Physical Therapy

## 2017-08-23 ENCOUNTER — Ambulatory Visit (INDEPENDENT_AMBULATORY_CARE_PROVIDER_SITE_OTHER): Payer: 59 | Admitting: Family Medicine

## 2017-08-23 ENCOUNTER — Ambulatory Visit: Payer: 59 | Admitting: Family Medicine

## 2017-08-23 ENCOUNTER — Encounter: Payer: Self-pay | Admitting: Family Medicine

## 2017-08-23 VITALS — BP 128/80 | HR 72 | Temp 98.2°F | Resp 14 | Ht 70.0 in | Wt 246.0 lb

## 2017-08-23 DIAGNOSIS — T25422A Corrosion of unspecified degree of left foot, initial encounter: Secondary | ICD-10-CM | POA: Diagnosis not present

## 2017-08-23 DIAGNOSIS — R293 Abnormal posture: Secondary | ICD-10-CM | POA: Diagnosis not present

## 2017-08-23 DIAGNOSIS — T304 Corrosion of unspecified body region, unspecified degree: Secondary | ICD-10-CM

## 2017-08-23 DIAGNOSIS — M542 Cervicalgia: Secondary | ICD-10-CM | POA: Diagnosis not present

## 2017-08-23 DIAGNOSIS — Z77098 Contact with and (suspected) exposure to other hazardous, chiefly nonmedicinal, chemicals: Secondary | ICD-10-CM | POA: Diagnosis not present

## 2017-08-23 MED ORDER — SILVER SULFADIAZINE 1 % EX CREA: 1.0000 "application " | TOPICAL_CREAM | Freq: Every day | CUTANEOUS | 0 refills | Status: DC

## 2017-08-23 MED ORDER — DICLOFENAC SODIUM 75 MG PO TBEC: 75.0000 mg | DELAYED_RELEASE_TABLET | Freq: Two times a day (BID) | ORAL | 0 refills | Status: DC

## 2017-08-23 MED FILL — DICLOFENAC SOD EC 75 MG TAB: 75 | 15 days supply | Qty: 30 | Fill #0

## 2017-08-23 MED FILL — SILVADENE 1% CREAM: 1 | 30 days supply | Qty: 50 | Fill #0
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
## 2017-08-23 NOTE — Patient Instructions (Signed)
Please use the cream and diclofenac as needed.  Let me or your regular doctor know if the pain worsens, if the skin falls off, or if the redness starts to spread.  Take care, Dr Jerline Pain

## 2017-08-23 NOTE — Therapy (Signed)
[facility] [address] [place], [place], [address] Phone: [phone]   Fax:  [phone]  Physical Therapy Treatment  Patient Details  Name: Zachary Graham MRN: [id number] Date of Birth: 1960/10/22 Referring Provider: [name]   Encounter Date: 08/23/2017  PT End of Session - 08/23/17 1253    Visit Number  10    Number of Visits  14    Date for PT Re-Evaluation  09/02/17    PT Start Time  0803    PT Stop Time  0846    PT Time Calculation (min)  43 min    Activity Tolerance  Patient tolerated treatment well    Behavior During Therapy  [facility] for tasks assessed/performed       Past Medical History:  Diagnosis Date  . Diabetes mellitus   . GERD (gastroesophageal reflux disease)   . Heart murmur   . Hyperlipidemia   . Hypertension   . OSA (obstructive sleep apnea)   . Pneumonia     Past Surgical History:  Procedure Laterality Date  . CERVICAL DISC ARTHROPLASTY N/A 03/16/2017   Procedure: CERVICAL FIVE- CERVICAL SIX [place] ARTHROPLASTY;  Surgeon: Jovita Gamma, MD;  Location: [place];  Service: Neurosurgery;  Laterality: N/A;  CERVICAL 5- CERVICAL 6 DISC ARTHROPLASTY  . COLONOSCOPY    . TONSILLECTOMY      There were no vitals filed for this visit.  Subjective Assessment - 08/23/17 1250    Subjective  Pt states shoulder/neck is doing better. He has had minimal pain over the weekend. He complains today of pain on dorsum of L foot, seeing PCP today. He was riding [employer], nature, and thinks gas spilled on his shoe. Significant redness on top of foot, but no swelling, bruising, or decreased motor function present. Recommend/Enforced MD appt today.     Currently in Pain?  Yes    Multiple Pain Sites  Yes    Pain Score  4    Pain Location  Foot    Pain Orientation  Left    Pain Descriptors / Indicators  Burning    Pain Type  Acute pain    Pain Onset  In the past 7 days    Pain Frequency  Intermittent                        [facility] Adult PT Treatment/Exercise - 08/23/17 0816      Posture/Postural Control   Posture Comments  Poor posture, rounded shoulders, forward head,        Shoulder Exercises: Supine   Horizontal ABduction  20 reps;Limitations    Horizontal ABduction Limitations  2lb    Flexion  20 reps;AROM    Flexion Limitations  2 lb      Shoulder Exercises: Sidelying   ABduction  20 reps;Weights    ABduction Weight (lbs)  2    Other Sidelying Exercises  Horizontal Abd x20      Shoulder Exercises: Standing   External Rotation  20 reps;Theraband    Theraband Level (Shoulder External Rotation)  Level 3 (Green)    Internal Rotation  20 reps;Theraband    Theraband Level (Shoulder Internal Rotation)  Level 3 (Green)    Row  20 reps;Theraband    Theraband Level (Shoulder Row)  Level 3 (Green)    Other Standing Exercises  Wall slides x20 L UE;       Shoulder Exercises: Pulleys   Flexion  2 minutes  Shoulder Exercises: Stretch   Corner Stretch  3 reps;30 seconds      Manual Therapy   Manual Therapy  Passive ROM;Soft tissue mobilization;Joint mobilization;Taping    Joint Mobilization  Posterior and Inferior mobs for L shoulder     Passive ROM  All motions, for L shoulder, L UT stretch             PT Education - 08/23/17 1253    Education provided  Yes    Education Details  Encouraged HEP. Discussed plan for d/c     Person(s) Educated  Patient    Methods  Explanation    Comprehension  Verbalized understanding       PT Short Term Goals - 08/16/17 0813      PT SHORT TERM GOAL #1   Title  Pt to be independent with initial HEP     Time  2    Period  Weeks    Status  Achieved      PT SHORT TERM GOAL #2   Title  Pt to demo ability to self correct posture in clinic at least 75% of the time     Time  2    Period  Weeks    Status  Achieved        PT Long Term Goals - 08/16/17 1126      PT LONG TERM GOAL #1   Title  Pt to demo improved  cervical and shoulder ROm, to be WNL, to improve ability for reaching, driving, and IADLs     Time  6    Period  Weeks    Status  Partially Met      PT LONG TERM GOAL #2   Title  Pt to demo decreased pain in cervical and shoulder region to 0-1/10 with activity, to improve ability for work duties    Time  6    Period  Weeks    Status  Partially Met      PT LONG TERM GOAL #3   Title  Pt to demo improved strength of Bil UE to at least 4+/5 to improve ability for lifting, reaching, and IADLs.     Status  Partially Met            Plan - 08/23/17 1254    Clinical Impression Statement  Pt seeing PCP for foot pain/burn today. He has improving shoulder pain, plan to see pt for one visit next week, then d/c to HEP. Pt continues to require cuing for posture with shoulder strengthening exercises.     Rehab Potential  Good    PT Frequency  2x / week    PT Duration  3 weeks    PT Treatment/Interventions  ADLs/Self Care Home Management;Cryotherapy;Electrical Stimulation;Iontophoresis 15m/ml Dexamethasone;Functional mobility training;Ultrasound;Moist Heat;Therapeutic activities;Therapeutic exercise;Neuromuscular re-education;Patient/family education;Passive range of motion;Manual techniques;Dry needling;Taping    PT Next Visit Plan  Teach final HEP for UE strength     Consulted and Agree with Plan of Care  Patient       Patient will benefit from skilled therapeutic intervention in order to improve the following deficits and impairments:  Decreased range of motion, Impaired UE functional use, Decreased activity tolerance, Pain, Impaired flexibility, Improper body mechanics, Hypomobility, Postural dysfunction, Decreased mobility  Visit Diagnosis: Cervicalgia  Abnormal posture     Problem List Patient Active Problem List   Diagnosis Date Noted  . HNP (herniated nucleus pulposus), cervical 03/16/2017  . 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  (  [place]) 01/06/2017  . Herniation of cervical intervertebral disc with radiculopathy 01/04/2017  . Current mild episode of major depressive disorder without prior episode ([place]) 12/02/2016  . Chest tightness 12/02/2016  . Diabetic neuropathy ([place]) 03/01/2016  . Hammer toe, acquired 06/19/2015  . Obesity, Class II, BMI 35.0-39.9, with comorbidity (see actual BMI) 04/24/2015  . Type 1 diabetes mellitus with hyperglycemia, with long-term current use of insulin ([place]) 04/04/2015  . Patellofemoral stress syndrome of left knee 03/19/2015  . Allergic rhinitis due to pollen 11/14/2014  . Equinus deformity of foot, acquired 05/29/2014  . ADHD (attention deficit hyperactivity disorder), inattentive type 10/24/2013  . Vitamin D deficiency 02/28/2013  . Pulmonic stenosis 05/25/2012  . Pure hyperglyceridemia 05/25/2012  . Hyperlipidemia with target LDL less than 100 09/20/2011  . OSA (obstructive sleep apnea) 09/20/2011  . BPH (benign prostatic hyperplasia) 09/20/2011  . Routine general medical examination at a health care facility 09/20/2011    Lyndee Hensen, PT, DPT 1:02 PM  08/23/17    [facility] [place], [place], [address] Phone: [phone]   Fax:  [phone]  Name: Zachary Graham MRN: [id number] Date of Birth: 12/19/1960

## 2017-08-31 ENCOUNTER — Ambulatory Visit (INDEPENDENT_AMBULATORY_CARE_PROVIDER_SITE_OTHER): Payer: 59 | Admitting: Physical Therapy

## 2017-08-31 DIAGNOSIS — M542 Cervicalgia: Secondary | ICD-10-CM

## 2017-08-31 DIAGNOSIS — R293 Abnormal posture: Secondary | ICD-10-CM | POA: Diagnosis not present

## 2017-08-31 DIAGNOSIS — D3132 Benign neoplasm of left choroid: Secondary | ICD-10-CM | POA: Diagnosis not present

## 2017-08-31 DIAGNOSIS — E113293 Type 2 diabetes mellitus with mild nonproliferative diabetic retinopathy without macular edema, bilateral: Secondary | ICD-10-CM | POA: Insufficient documentation

## 2017-08-31 DIAGNOSIS — H269 Unspecified cataract: Secondary | ICD-10-CM | POA: Insufficient documentation

## 2017-08-31 NOTE — Therapy (Signed)
[facility] [address] [place], [place], [address] Phone: [phone]   Fax:  [phone]  Physical Therapy Treatment/Discharge  Patient Details  Name: Zachary Graham MRN: [id number] Date of Birth: 17-Apr-1961 Referring Provider: [name]   Encounter Date: 08/31/2017  PT End of Session - 08/31/17 0919    Visit Number  11    Number of Visits  14    Date for PT Re-Evaluation  09/02/17    PT Start Time  0815    PT Stop Time  7371    PT Time Calculation (min)  40 min    Activity Tolerance  Patient tolerated treatment well    Behavior During Therapy  [facility] for tasks assessed/performed       Past Medical History:  Diagnosis Date  . Diabetes mellitus   . GERD (gastroesophageal reflux disease)   . Heart murmur   . Hyperlipidemia   . Hypertension   . OSA (obstructive sleep apnea)   . Pneumonia     Past Surgical History:  Procedure Laterality Date  . CERVICAL DISC ARTHROPLASTY N/A 03/16/2017   Procedure: CERVICAL FIVE- CERVICAL SIX [place] ARTHROPLASTY;  Surgeon: Jovita Gamma, MD;  Location: [place];  Service: Neurosurgery;  Laterality: N/A;  CERVICAL 5- CERVICAL 6 DISC ARTHROPLASTY  . COLONOSCOPY    . TONSILLECTOMY      There were no vitals filed for this visit.  Subjective Assessment - 08/31/17 0918    Subjective  Pt states overall much improvement. He has slight increase in soreness with heavier work at home, lifting, carrying, and some fatigue of shoulders/neck after longer work day. Pt states he is mostly pain free, and has several days that he has no pain.     Currently in Pain?  No/denies    Pain Score  0-No pain         [facility] PT Assessment - 08/31/17 0823      Strength   Overall Strength Comments  Cervical ROM: WFL;  L shoulder: 4+/5 to 4+/5  ; R shoulder 4+/5 ;  Grip: L: 70 lb; R: 75 lb;                      [facility] Adult PT Treatment/Exercise - 08/31/17 0823      Posture/Postural Control   Posture  Comments  Poor posture, rounded shoulders, forward head,        Shoulder Exercises: Supine   Horizontal ABduction  20 reps;Limitations    Horizontal ABduction Limitations  2lb    Flexion  20 reps;AROM    Flexion Limitations  2 lb      Shoulder Exercises: Sidelying   ABduction  20 reps;Weights    ABduction Weight (lbs)  2    Other Sidelying Exercises  Horizontal Abd x20      Shoulder Exercises: Standing   External Rotation  20 reps;Theraband    Theraband Level (Shoulder External Rotation)  Level 3 (Green)    Internal Rotation  20 reps;Theraband    Theraband Level (Shoulder Internal Rotation)  Level 3 (Green)    Row  20 reps;Theraband    Theraband Level (Shoulder Row)  Level 3 (Green)    Other Standing Exercises  Wall slides x20 L UE;       Shoulder Exercises: Pulleys   Flexion  2 minutes      Shoulder Exercises: Stretch   Corner Stretch  3 reps;30 seconds      Manual Therapy   Manual  Therapy  Passive ROM;Soft tissue mobilization;Joint mobilization;Taping    Joint Mobilization  --    Passive ROM  All motions, for L shoulder, L UT stretch             PT Education - 08/31/17 0919    Education provided  Yes    Education Details  HEP    Person(s) Educated  Patient    Methods  Explanation    Comprehension  Verbalized understanding       PT Short Term Goals - 08/16/17 0813      PT SHORT TERM GOAL #1   Title  Pt to be independent with initial HEP     Time  2    Period  Weeks    Status  Achieved      PT SHORT TERM GOAL #2   Title  Pt to demo ability to self correct posture in clinic at least 75% of the time     Time  2    Period  Weeks    Status  Achieved        PT Long Term Goals - 08/31/17 0920      PT LONG TERM GOAL #1   Title  Pt to demo improved cervical and shoulder ROm, to be WNL, to improve ability for reaching, driving, and IADLs     Time  6    Period  Weeks    Status  Achieved      PT LONG TERM GOAL #2   Title  Pt to demo decreased pain in  cervical and shoulder region to 0-1/10 with activity, to improve ability for work duties    Time  6    Period  Weeks    Status  Achieved      PT LONG TERM GOAL #3   Title  Pt to demo improved strength of Bil UE to at least 4+/5 to improve ability for lifting, reaching, and IADLs.     Status  Achieved            Plan - 08/31/17 0921    Clinical Impression Statement  Pt has met goals for PT and is ready for d/c to HEP. He has good understanding of final HEP for strengthening and stretching for shoulders and neck. He has much improved strength of L UE, and improved grip strength as well. He has no pain with movement and ROM today, but states mild soreness at times with increased activity. Recommended that if pain worsens or does not improve for him to obtain follow up appt with [facility] that he has seen in the past. Pt in agreement with plan.     Rehab Potential  Good    PT Frequency  2x / week    PT Duration  3 weeks    PT Treatment/Interventions  ADLs/Self Care Home Management;Cryotherapy;Electrical Stimulation;Iontophoresis 31m/ml Dexamethasone;Functional mobility training;Ultrasound;Moist Heat;Therapeutic activities;Therapeutic exercise;Neuromuscular re-education;Patient/family education;Passive range of motion;Manual techniques;Dry needling;Taping    Consulted and Agree with Plan of Care  Patient       Patient will benefit from skilled therapeutic intervention in order to improve the following deficits and impairments:  Decreased range of motion, Impaired UE functional use, Decreased activity tolerance, Pain, Impaired flexibility, Improper body mechanics, Hypomobility, Postural dysfunction, Decreased mobility  Visit Diagnosis: Cervicalgia  Abnormal posture     Problem List Patient Active Problem List   Diagnosis Date Noted  . HNP (herniated nucleus pulposus), cervical 03/16/2017  . Mild nonproliferative diabetic retinopathy associated with  type 1 diabetes mellitus ([place])  01/06/2017  . Herniation of cervical intervertebral disc with radiculopathy 01/04/2017  . Current mild episode of major depressive disorder without prior episode ([place]) 12/02/2016  . Chest tightness 12/02/2016  . Diabetic neuropathy ([place]) 03/01/2016  . Hammer toe, acquired 06/19/2015  . Obesity, Class II, BMI 35.0-39.9, with comorbidity (see actual BMI) 04/24/2015  . Type 1 diabetes mellitus with hyperglycemia, with long-term current use of insulin ([place]) 04/04/2015  . Patellofemoral stress syndrome of left knee 03/19/2015  . Allergic rhinitis due to pollen 11/14/2014  . Equinus deformity of foot, acquired 05/29/2014  . ADHD (attention deficit hyperactivity disorder), inattentive type 10/24/2013  . Vitamin D deficiency 02/28/2013  . Pulmonic stenosis 05/25/2012  . Pure hyperglyceridemia 05/25/2012  . Hyperlipidemia with target LDL less than 100 09/20/2011  . OSA (obstructive sleep apnea) 09/20/2011  . BPH (benign prostatic hyperplasia) 09/20/2011  . Routine general medical examination at a health care facility 09/20/2011    Lyndee Hensen, PT, DPT 9:26 AM  08/31/17   [facility] [place], [place], [address] Phone: [phone]   Fax:  [phone]  Name: Zachary Graham MRN: [id number] Date of Birth: [birth year]    PHYSICAL THERAPY DISCHARGE SUMMARY  Visits from Start of Care: 11 Plan: Patient agrees to discharge.  Patient goals were met. Patient is being discharged due to meeting the stated rehab goals.  ?????       Lyndee Hensen, PT, DPT 9:27 AM  08/31/17

## 2017-09-09 ENCOUNTER — Other Ambulatory Visit: Payer: Self-pay | Admitting: Internal Medicine

## 2017-09-09 ENCOUNTER — Other Ambulatory Visit: Payer: Self-pay | Admitting: Family Medicine

## 2017-09-12 MED FILL — SILVADENE 1% CREAM: 1 | 20 days supply | Qty: 50 | Fill #0

## 2017-09-13 MED FILL — MICROLET LANCETS: 50 days supply | Qty: 300 | Fill #0

## 2017-09-30 MED FILL — FREESTYLE LIBRE SENSOR SYST: 30 days supply | Qty: 3 | Fill #1

## 2017-09-30 MED FILL — PANTOPRAZOLE SOD DR 40 MG T: 40 | 90 days supply | Qty: 90 | Fill #2

## 2017-09-30 MED FILL — CONTOUR NEXT STRIPS: 66 days supply | Qty: 400 | Fill #1

## 2017-10-03 ENCOUNTER — Other Ambulatory Visit (INDEPENDENT_AMBULATORY_CARE_PROVIDER_SITE_OTHER): Payer: 59

## 2017-10-03 ENCOUNTER — Encounter: Payer: Self-pay | Admitting: Internal Medicine

## 2017-10-03 ENCOUNTER — Ambulatory Visit (INDEPENDENT_AMBULATORY_CARE_PROVIDER_SITE_OTHER): Payer: 59 | Admitting: Internal Medicine

## 2017-10-03 VITALS — BP 108/70 | HR 66 | Temp 98.4°F | Ht 70.0 in | Wt 238.0 lb

## 2017-10-03 DIAGNOSIS — E781 Pure hyperglyceridemia: Secondary | ICD-10-CM | POA: Diagnosis not present

## 2017-10-03 DIAGNOSIS — Z0000 Encounter for general adult medical examination without abnormal findings: Secondary | ICD-10-CM

## 2017-10-03 DIAGNOSIS — E1065 Type 1 diabetes mellitus with hyperglycemia: Secondary | ICD-10-CM | POA: Diagnosis not present

## 2017-10-03 DIAGNOSIS — E559 Vitamin D deficiency, unspecified: Secondary | ICD-10-CM

## 2017-10-03 DIAGNOSIS — E785 Hyperlipidemia, unspecified: Secondary | ICD-10-CM

## 2017-10-03 LAB — LIPID PANEL
CHOL/HDL RATIO: 3
Cholesterol: 89 mg/dL (ref 0–200)
HDL: 34.1 mg/dL — AB (ref >39.00)
LDL Cholesterol: 33 mg/dL (ref 0–99)
NonHDL: 54.64
Triglycerides: 109 mg/dL (ref 0.0–149.0)
VLDL: 21.8 mg/dL (ref 0.0–40.0)

## 2017-10-03 LAB — BASIC METABOLIC PANEL
BUN: 14 mg/dL (ref 6–23)
CALCIUM: 9.3 mg/dL (ref 8.4–10.5)
CO2: 27 meq/L (ref 19–32)
CREATININE: 0.8 mg/dL (ref 0.40–1.50)
Chloride: 100 mEq/L (ref 96–112)
GFR: 106.87 mL/min (ref >60.00)
GLUCOSE: 146 mg/dL — AB (ref 70–99)
Potassium: 4 mEq/L (ref 3.5–5.1)
Sodium: 136 mEq/L (ref 135–145)

## 2017-10-03 LAB — URINALYSIS, ROUTINE W REFLEX MICROSCOPIC
Bilirubin Urine: NEGATIVE
Hgb urine dipstick: NEGATIVE
Ketones, ur: NEGATIVE
Leukocytes, UA: NEGATIVE
Nitrite: NEGATIVE
RBC / HPF: NONE SEEN (ref 0–?)
Specific Gravity, Urine: 1.02 (ref 1.000–1.030)
Total Protein, Urine: NEGATIVE
Urine Glucose: 250 — AB
Urobilinogen, UA: 0.2 (ref 0.0–1.0)
pH: 5.5 (ref 5.0–8.0)

## 2017-10-03 LAB — POCT GLYCOSYLATED HEMOGLOBIN (HGB A1C): Hemoglobin A1C: 8.1 % — AB (ref 4.0–5.6)

## 2017-10-03 LAB — MICROALBUMIN / CREATININE URINE RATIO
Creatinine,U: 90.5 mg/dL
Microalb Creat Ratio: 0.8 mg/g (ref 0.0–30.0)

## 2017-10-03 NOTE — Patient Instructions (Signed)

## 2017-10-03 NOTE — Progress Notes (Signed)
Subjective:  Patient ID: Zachary Graham, male    DOB: 1961/02/28  Age: 57 y.o. MRN: [id number]  CC: Hyperlipidemia; Diabetes; and Annual Exam   HPI Zachary Graham presents for a CPX.  He has been able to lose about 35 pounds over the last year with dietary restrictions.  Unfortunately, he tells me his blood sugars have not been well controlled.  He denies any recent episodes of polyuria, polydipsia, or polyphagia.  Outpatient Medications Prior to Visit  Medication Sig Dispense Refill  . acetaminophen (TYLENOL) 325 MG tablet Take 650 mg by mouth every 6 (six) hours as needed for mild pain.    . Alpha-Lipoic Acid 600 MG CAPS Take 1 capsule by mouth daily.     Marland Kitchen amphetamine-dextroamphetamine (ADDERALL) 10 MG tablet Take 1 tablet (10 mg total) by mouth 2 (two) times daily. 60 tablet 0  . amphetamine-dextroamphetamine (ADDERALL) 5 MG tablet Take 1 tablet (5 mg total) by mouth daily. 30 tablet 0  . Ascorbic Acid (VITAMIN C) 1000 MG tablet Take 1,000 mg by mouth daily.    Marland Kitchen aspirin EC 81 MG tablet Take 81 mg by mouth daily.    Marland Kitchen b complex vitamins tablet Take 1 tablet by mouth daily.    Marland Kitchen BAYER MICROLET LANCETS lancets USE TO TEST BLOOD SUGAR 6 TIMES A DAY 300 each 11  . Biotin 5000 MCG TABS Take by mouth.    . Calcium-Magnesium-Vitamin D (CALCIUM 500 PO) Take 1 tablet by mouth daily.     . CONTOUR NEXT TEST test strip USE AS INSTRUCTED TO CHECK SUGAR 6 TIMES DAILY 400 each 5  . diclofenac (VOLTAREN) 75 MG EC tablet Take 1 tablet (75 mg total) by mouth 2 (two) times daily. 30 tablet 0  . glucagon (GLUCAGON EMERGENCY) 1 MG injection Inject 1 mg into the muscle once as needed. 2 each prn  . insulin lispro (HUMALOG) 100 UNIT/ML injection Inject into the skin 3 (three) times daily before meals. Insulin pump    . losartan (COZAAR) 50 MG tablet TAKE 1 TABLET BY MOUTH ONCE DAILY (Patient taking differently: TAKE 50 mg BY MOUTH ONCE DAILY) 90 tablet 1  . Melatonin 5 MG CHEW Chew 10 tablets by mouth at  bedtime.     . metFORMIN (GLUCOPHAGE) 500 MG tablet Take 2 tablets (1,000 mg total) by mouth daily with supper. 180 tablet 1  . Misc Natural Products (TURMERIC CURCUMIN) CAPS Take 1 capsule by mouth daily.     . NON FORMULARY CPAP    . pantoprazole (PROTONIX) 40 MG tablet TAKE 1 TABLET BY MOUTH ONCE DAILY (Patient taking differently: TAKE 40 mg BY MOUTH ONCE DAILY) 90 tablet 3  . QNASL 80 MCG/ACT AERS PLACE 4 PUFFS INTO THE NOSE DAILY. (Patient taking differently: Place 2 puffs into the nose daily as needed (allergies). ) 8.7 g 5  . SILVADENE 1 % cream APPLY 1 APPLICATION TOPICALLY DAILY. 50 g 0  . simvastatin (ZOCOR) 40 MG tablet Take 1 tablet (40 mg total) by mouth daily. 90 tablet 3  . Biotin 10000 MCG TABS Take 0.5 tablets by mouth daily.      No facility-administered medications prior to visit.     ROS Review of Systems  Constitutional: Negative.  Negative for appetite change, diaphoresis, fatigue and fever.  HENT: Negative.   Eyes: Negative for visual disturbance.  Respiratory: Negative.  Negative for cough, chest tightness, shortness of breath and wheezing.   Cardiovascular: Negative for chest pain, palpitations and leg  swelling.  Gastrointestinal: Negative for abdominal pain, constipation, diarrhea, nausea and vomiting.  Endocrine: Negative.  Negative for polydipsia, polyphagia and polyuria.  Genitourinary: Negative.  Negative for difficulty urinating, dysuria, frequency, hematuria, scrotal swelling, testicular pain and urgency.  Musculoskeletal: Negative.  Negative for arthralgias, myalgias and neck pain.  Skin: Negative.  Negative for color change and pallor.  Allergic/Immunologic: Negative.   Neurological: Negative.  Negative for dizziness, weakness and light-headedness.  Hematological: Negative for adenopathy. Does not bruise/bleed easily.  Psychiatric/Behavioral: Negative.     Objective:  BP 108/70 (BP Location: Left Arm, Patient Position: Sitting, Cuff Size: Large)    Pulse 66   Temp 98.4 F (36.9 C) (Oral)   Ht 5\' 10"  (1.778 m)   Wt 238 lb (108 kg)   SpO2 98%   BMI 34.15 kg/m   BP Readings from Last 3 Encounters:  10/03/17 108/70  08/23/17 128/80  04/27/17 122/68    Wt Readings from Last 3 Encounters:  10/03/17 238 lb (108 kg)  08/23/17 246 lb (111.6 kg)  04/27/17 256 lb (116.1 kg)    Physical Exam  Constitutional: He is oriented to person, place, and time. No distress.  HENT:  Mouth/Throat: Oropharynx is clear and moist. No oropharyngeal exudate.  Eyes: Conjunctivae are normal. No scleral icterus.  Neck: Normal range of motion. Neck supple. No JVD present. No thyromegaly present.  Cardiovascular: Normal rate, regular rhythm and normal heart sounds. Exam reveals no gallop and no friction rub.  No murmur heard. Pulmonary/Chest: Effort normal and breath sounds normal. No stridor. No respiratory distress. He has no wheezes. He has no rales.  Abdominal: Soft. Bowel sounds are normal. He exhibits no mass. There is no hepatosplenomegaly. There is no tenderness. Hernia confirmed negative in the right inguinal area and confirmed negative in the left inguinal area.  Genitourinary: Rectum normal, testes normal and penis normal. Rectal exam shows no external hemorrhoid, no internal hemorrhoid, no fissure, no mass, no tenderness, anal tone normal and guaiac negative stool. Prostate is enlarged (1+ smooth symm BPH). Prostate is not tender. Cremasteric reflex is present. Right testis shows no mass, no swelling and no tenderness. Left testis shows no mass, no swelling and no tenderness. Circumcised. No penile erythema or penile tenderness. No discharge found.  Musculoskeletal: Normal range of motion. He exhibits no edema, tenderness or deformity.  Lymphadenopathy:    He has no cervical adenopathy. No inguinal adenopathy noted on the right or left side.  Neurological: He is alert and oriented to person, place, and time.  Skin: Skin is warm and dry. He is not  diaphoretic.  Vitals reviewed.   Lab Results  Component Value Date   WBC 8.5 03/14/2017   HGB 13.7 03/14/2017   HCT 41.5 03/14/2017   PLT 198 03/14/2017   GLUCOSE 146 (H) 10/03/2017   CHOL 89 10/03/2017   TRIG 109.0 10/03/2017   HDL 34.10 (L) 10/03/2017   LDLDIRECT 87.0 12/02/2016   LDLCALC 33 10/03/2017   ALT 23 12/02/2016   AST 14 12/02/2016   NA 136 10/03/2017   K 4.0 10/03/2017   CL 100 10/03/2017   CREATININE 0.80 10/03/2017   BUN 14 10/03/2017   CO2 27 10/03/2017   TSH 3.01 03/01/2016   PSA 0.37 10/03/2017   HGBA1C 8.1 (A) 10/03/2017   MICROALBUR <0.7 10/03/2017    No results found.  Assessment & Plan:   Chantry was seen today for hyperlipidemia, diabetes and annual exam.  Diagnoses and all orders for this visit:  Pure hyperglyceridemia- Improvement noted -     VITAMIN D 25 Hydroxy (Vit-D Deficiency, Fractures); Future  Hyperlipidemia with target LDL less than 100- He has achieved his LDL goal and is doing well on the statin. -     VITAMIN D 25 Hydroxy (Vit-D Deficiency, Fractures); Future  Type 1 diabetes mellitus with hyperglycemia, with long-term current use of insulin ([place])- His A1c is at 8.1%.  He is on an insulin pump.  His blood sugars are not adequately well controlled.  He will follow up with his endocrinologist. -     Basic metabolic panel; Future -     Microalbumin / creatinine urine ratio; Future -     Urinalysis, Routine w reflex microscopic; Future -     POCT glycosylated hemoglobin (Hb A1C)  Vitamin D deficiency- His vitamin D level is in the normal range.  Will continue the current vitamin D supplement. -     Cancel: Lipid panel; Future  Routine general medical examination at a health care facility- Exam completed, labs reviewed, vaccines reviewed, screening for colon cancer is up-to-date, patient education material was given. -     PSA; Future -     Lipid panel; Future   I am having Ryoma D. Nelligan maintain his glucagon, simvastatin,  Melatonin, Turmeric Curcumin, Calcium-Magnesium-Vitamin D (CALCIUM 500 PO), vitamin C, Alpha-Lipoic Acid, b complex vitamins, losartan, QNASL, metFORMIN, HUMALOG, pantoprazole, acetaminophen, aspirin EC, NON FORMULARY, amphetamine-dextroamphetamine, amphetamine-dextroamphetamine, CONTOUR NEXT TEST, diclofenac, BAYER MICROLET LANCETS, SILVADENE, and Biotin.  No orders of the defined types were placed in this encounter.    Follow-up: Return in about 6 months (around 04/05/2018).  Scarlette Calico, MD

## 2017-10-04 ENCOUNTER — Encounter: Payer: Self-pay | Admitting: Internal Medicine

## 2017-10-04 LAB — PSA: PSA: 0.37 ng/mL (ref 0.10–4.00)

## 2017-10-04 LAB — VITAMIN D 25 HYDROXY (VIT D DEFICIENCY, FRACTURES): VITD: 83.45 ng/mL (ref 30.00–100.00)

## 2017-10-11 ENCOUNTER — Other Ambulatory Visit: Payer: Self-pay | Admitting: Internal Medicine

## 2017-10-11 ENCOUNTER — Other Ambulatory Visit: Payer: Self-pay | Admitting: Family Medicine

## 2017-10-11 DIAGNOSIS — F900 Attention-deficit hyperactivity disorder, predominantly inattentive type: Secondary | ICD-10-CM

## 2017-10-11 MED ORDER — AMPHETAMINE-DEXTROAMPHETAMINE 5 MG PO TABS: 5.0000 mg | ORAL_TABLET | Freq: Every day | ORAL | 0 refills | Status: DC

## 2017-10-11 MED ORDER — AMPHETAMINE-DEXTROAMPHETAMINE 10 MG PO TABS: 10.0000 mg | ORAL_TABLET | Freq: Two times a day (BID) | ORAL | 0 refills | Status: DC

## 2017-10-11 MED FILL — DEXTROAMP-AMPHETAMINE 5 MG: 5 | 30 days supply | Qty: 30 | Fill #0

## 2017-10-11 MED FILL — DEXTROAMP-AMP 10 MG TAB: 10 | 30 days supply | Qty: 60 | Fill #0

## 2017-10-12 MED ORDER — DICLOFENAC SODIUM 75 MG PO TBEC: 75.0000 mg | DELAYED_RELEASE_TABLET | Freq: Two times a day (BID) | ORAL | 0 refills | Status: DC

## 2017-10-12 NOTE — Telephone Encounter (Signed)
Pt was seen over 6 weeks ago for this. Will need to be seen again if he is still having ongoing pain requiring prescription meds.  Algis Greenhouse. Jerline Pain, MD 10/12/2017 8:18 AM

## 2017-10-12 NOTE — Telephone Encounter (Signed)
Sent message through MyChart to call and schedule an appt.

## 2017-10-28 MED FILL — FREESTYLE LIBRE SENSOR SYST: 30 days supply | Qty: 3 | Fill #2

## 2017-11-16 ENCOUNTER — Other Ambulatory Visit: Payer: Self-pay | Admitting: Internal Medicine

## 2017-11-16 DIAGNOSIS — F900 Attention-deficit hyperactivity disorder, predominantly inattentive type: Secondary | ICD-10-CM

## 2017-11-16 MED ORDER — AMPHETAMINE-DEXTROAMPHETAMINE 10 MG PO TABS: 10.0000 mg | ORAL_TABLET | Freq: Two times a day (BID) | ORAL | 0 refills | Status: DC

## 2017-11-16 MED ORDER — AMPHETAMINE-DEXTROAMPHETAMINE 5 MG PO TABS: 5.0000 mg | ORAL_TABLET | Freq: Every day | ORAL | 0 refills | Status: DC

## 2017-11-16 MED FILL — DEXTROAMP-AMPHETAMINE 5 MG: 5 | 90 days supply | Qty: 90 | Fill #0

## 2017-11-16 MED FILL — DEXTROAMP-AMP 10 MG TAB: 10 | 90 days supply | Qty: 180 | Fill #0

## 2017-11-29 ENCOUNTER — Encounter: Payer: Self-pay | Admitting: Internal Medicine

## 2017-11-29 MED FILL — FREESTYLE LIBRE SENSOR SYST: 30 days supply | Qty: 3 | Fill #3

## 2017-11-29 MED FILL — CONTOUR NEXT STRIPS: 66 days supply | Qty: 400 | Fill #2

## 2017-11-30 ENCOUNTER — Encounter: Payer: Self-pay | Admitting: Internal Medicine

## 2017-12-01 ENCOUNTER — Telehealth: Payer: Self-pay | Admitting: Internal Medicine

## 2017-12-01 ENCOUNTER — Other Ambulatory Visit: Payer: Self-pay | Admitting: Internal Medicine

## 2017-12-01 DIAGNOSIS — F900 Attention-deficit hyperactivity disorder, predominantly inattentive type: Secondary | ICD-10-CM

## 2017-12-01 MED ORDER — FREESTYLE LIBRE 14 DAY SENSOR MISC: 1.0000 | 11 refills | Status: DC

## 2017-12-01 MED ORDER — FREESTYLE LIBRE 14 DAY READER DEVI: 1.0000 | Freq: Once | 1 refills | Status: AC

## 2017-12-01 NOTE — Telephone Encounter (Signed)
Copied from [place] [id number]. Topic: Quick Communication - See Telephone Encounter >> Dec 01, 2017 12:24 PM Conception Chancy, NT wrote: CRM for notification. See Telephone encounter for: 12/01/17.  Patient is calling and requesting a refill on amphetamine-dextroamphetamine (ADDERALL) 10 MG tablet and amphetamine-dextroamphetamine (ADDERALL) 5 MG tablet. Please advise.  [place], [place] [address] Phone: [phone] Fax: [phone]

## 2017-12-01 NOTE — Telephone Encounter (Signed)
Check [facility] registry last filled 11/16/2017. Not due until 12/17/17. No early refills.Marland KitchenJohny Chess

## 2017-12-14 ENCOUNTER — Ambulatory Visit: Payer: 59 | Admitting: Internal Medicine

## 2017-12-14 ENCOUNTER — Encounter: Payer: Self-pay | Admitting: Internal Medicine

## 2017-12-15 ENCOUNTER — Encounter: Payer: Self-pay | Admitting: Internal Medicine

## 2017-12-16 ENCOUNTER — Ambulatory Visit: Payer: 59

## 2017-12-17 ENCOUNTER — Encounter: Payer: Self-pay | Admitting: Internal Medicine

## 2017-12-22 ENCOUNTER — Ambulatory Visit (INDEPENDENT_AMBULATORY_CARE_PROVIDER_SITE_OTHER): Payer: 59 | Admitting: *Deleted

## 2017-12-22 DIAGNOSIS — Z23 Encounter for immunization: Secondary | ICD-10-CM | POA: Diagnosis not present

## 2017-12-28 MED FILL — PANTOPRAZOLE SOD DR 40 MG T: 40 | 90 days supply | Qty: 90 | Fill #3

## 2017-12-28 MED FILL — FREESTYLE LIBRE SENSOR SYST: 30 days supply | Qty: 3 | Fill #4

## 2017-12-31 ENCOUNTER — Encounter: Payer: Self-pay | Admitting: Internal Medicine

## 2018-01-05 ENCOUNTER — Encounter: Payer: Self-pay | Admitting: Internal Medicine

## 2018-01-05 ENCOUNTER — Ambulatory Visit (INDEPENDENT_AMBULATORY_CARE_PROVIDER_SITE_OTHER): Payer: 59 | Admitting: Internal Medicine

## 2018-01-05 VITALS — BP 128/82 | HR 68 | Ht 70.0 in | Wt 219.4 lb

## 2018-01-05 DIAGNOSIS — E785 Hyperlipidemia, unspecified: Secondary | ICD-10-CM | POA: Diagnosis not present

## 2018-01-05 DIAGNOSIS — E1065 Type 1 diabetes mellitus with hyperglycemia: Secondary | ICD-10-CM | POA: Diagnosis not present

## 2018-01-05 LAB — POCT GLYCOSYLATED HEMOGLOBIN (HGB A1C): Hemoglobin A1C: 7.3 % — AB (ref 4.0–5.6)

## 2018-01-05 MED ORDER — FREESTYLE LIBRE 14 DAY SENSOR MISC: 1.0000 | 11 refills | Status: DC

## 2018-01-05 MED ORDER — SILVADENE 1 % EX CREA: TOPICAL_CREAM | CUTANEOUS | 0 refills | Status: DC

## 2018-01-05 MED ORDER — GLUCAGON (RDNA) 1 MG IJ KIT: 1.0000 mg | PACK | Freq: Once | INTRAMUSCULAR | 99 refills | Status: DC | PRN

## 2018-01-05 MED FILL — GLUCAGON 1 MG EMERGENCY KIT: 1 | 2 days supply | Qty: 2 | Fill #0

## 2018-01-05 NOTE — Patient Instructions (Addendum)
Please change: - Basal rates: 12 AM: 2.00 >> 1.8 4 AM: 1.8 8 AM: 2.5 >> 2.2 12 PM: 2.4 >> 2.2 4 PM: 2.2 >> 1.8 10:30 PM: 2.3 >> 1.8 - bolus: - insulin to carb ratio:  12 AM: 1.5 >> 5 11 AM: 2.0 >> 5 5 PM: 1.5 >> 5 - ISF: 10 >> 25 - target: 100-120 - IOB: 3 hours  Please continue Metformin 1000 mg 2x a day.  Please return in 3-4 months.

## 2018-01-05 NOTE — Progress Notes (Signed)
Patient ID: Zachary Graham, male   DOB: 01/20/1961, 57 y.o.   MRN: [id number]  HPI: Zachary Graham is a 57 y.o.-year-old male, returning for f/u for DM1, dx 1993, insulin-dependent, uncontrolled, with complications (mild sensory peripheral neuropathy; hypoglycemia episodes, DR). Last visit 11 mo ago.  He started on insulin pump in 2006.  He is currently on a Medtronic 670 G insulin pump with NovoLog.   Last hemoglobin A1c was: Lab Results  Component Value Date   HGBA1C 8.1 (A) 10/03/2017   HGBA1C 7.6 03/10/2017   HGBA1C 9.7 (H) 12/02/2016  04/05/2014: HbA1c 7.3%  Pump settings: - Basal rates: 12 AM: 3.0 >> 2.00 4 am: 1.8 8 AM: 3.10 >> 2.5 12 PM: 2.2 >> 2.4 4 PM: 2.8 >> 2.2 10:30 PM: 2.7 >> 2.3 - bolus: - insulin to carb ratio:  12 AM: 1.5 11 AM: 2.0 5 PM: 1.5 - ISF: 10 - target: 100-120 - IOB: 3 hours  TDD basal: 24% >> 29% >> 38% >> 31% TDD bolus: 76% >> 71% >> 62% >> 69% TDD 192 +/- 32 units >> 178 units >> 78 units ! - Bolus wizard: On - Changes the pump site: every 2.6 days  Pt checks his sugars 7.2x a day: - am: 168-287 >> 84-239, 265 >>> 53 (correstion of a high), 69-141 - 8-9 am: 75, 234-269 >> 83, 114-144, 184 >> 63-181, 244 - noon: 118-204 >> 78-195, 227 >> 90-139 - before dinner: 139-276, 381 >> 79, 161, 192, 248 >> 63-177 - after dinner: 71, 160-267, 355 >> 82-209 >> 52-175 - bedtime (12 am): >400 (site pb), 133-197, 274 -Nighttime: 144-296, 354 >> 117-310 >> see above >> 89-209, 243  He has a history of severe hypoglycemia episodes in 2015.  No previous episodes of DKA.  -No CKD, last BUN/creatinine:  Lab Results  Component Value Date   BUN 14 10/03/2017   CREATININE 0.80 10/03/2017  On Cozaar. -+ HL; last set of lipids: Lab Results  Component Value Date   CHOL 89 10/03/2017   HDL 34.10 (L) 10/03/2017   LDLCALC 33 10/03/2017   LDLDIRECT 87.0 12/02/2016   TRIG 109.0 10/03/2017   CHOLHDL 3 10/03/2017  On Zocor. 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 [facility].  - + numbness and tingling in his feet.  Improved on alpha lipoic acid and B complex.  He also has a history of GERD, HTN, OSA.  Latest TSH was normal: Lab Results  Component Value Date   TSH 3.01 03/01/2016   ROS: Constitutional: + weight loss, no fatigue, no subjective hyperthermia, no subjective hypothermia Eyes: no blurry vision, no xerophthalmia ENT: no sore throat, no nodules palpated in throat, no dysphagia, no odynophagia, no hoarseness Cardiovascular: no CP/no SOB/no palpitations/no leg swelling Respiratory: no cough/no SOB/no wheezing Gastrointestinal: no N/no V/no D/no C/no acid reflux Musculoskeletal: no muscle aches/no joint aches Skin: no rashes, no hair loss Neurological: no tremors/no numbness/no tingling/no dizziness  I reviewed pt's medications, allergies, PMH, social hx, family hx, and changes were documented in the history of present illness. Otherwise, unchanged from my initial visit note.  Past Medical History:  Diagnosis Date  . Diabetes mellitus   . GERD (gastroesophageal reflux disease)   . Heart murmur   . Hyperlipidemia   . Hypertension   . OSA (obstructive sleep apnea)   . Pneumonia    Past Surgical History:  Procedure Laterality Date  . CERVICAL DISC ARTHROPLASTY N/A 03/16/2017   Procedure:  CERVICAL FIVE- CERVICAL SIX [place] ARTHROPLASTY;  Surgeon: Jovita Gamma, MD;  Location: [place];  Service: Neurosurgery;  Laterality: N/A;  CERVICAL 5- CERVICAL 6 DISC ARTHROPLASTY  . COLONOSCOPY    . TONSILLECTOMY     Social History   Socioeconomic History  . Marital status: Married    Spouse name: Not on file  . Number of children: 3  . Years of education: Not on file  . Highest education level: Not on file  Occupational History  . Occupation: RADIATION SAFETY OFFICER    Employer: [employer]  Social Needs  . Financial resource strain: Not on file  . Food insecurity:    Worry: Not on file    Inability: Not on file   . Transportation needs:    Medical: Not on file    Non-medical: Not on file  Tobacco Use  . Smoking status: Never Smoker  . Smokeless tobacco: Never Used  Substance and Sexual Activity  . Alcohol use: No    Alcohol/week: 0.0 standard drinks  . Drug use: No  . Sexual activity: Yes    Partners: Female  Lifestyle  . Physical activity:    Days per week: Not on file    Minutes per session: Not on file  . Stress: Not on file  Relationships  . Social connections:    Talks on phone: Not on file    Gets together: Not on file    Attends religious service: Not on file    Active member of club or organization: Not on file    Attends meetings of clubs or organizations: Not on file    Relationship status: Not on file  . Intimate partner violence:    Fear of current or ex partner: Not on file    Emotionally abused: Not on file    Physically abused: Not on file    Forced sexual activity: Not on file  Other Topics Concern  . Not on file  Social History Narrative   Regular exercise: runs 3 miles 3x week, takes one day off per week to rest   Caffeine use: stopped all diet soda on 04/23/15 per advice from Sports MD   Current Outpatient Medications on File Prior to Visit  Medication Sig Dispense Refill  . acetaminophen (TYLENOL) 325 MG tablet Take 650 mg by mouth every 6 (six) hours as needed for mild pain.    . Alpha-Lipoic Acid 600 MG CAPS Take 1 capsule by mouth daily.     Marland Kitchen amphetamine-dextroamphetamine (ADDERALL) 10 MG tablet Take 1 tablet (10 mg total) by mouth 2 (two) times daily. 180 tablet 0  . amphetamine-dextroamphetamine (ADDERALL) 5 MG tablet Take 1 tablet (5 mg total) by mouth daily. 90 tablet 0  . Ascorbic Acid (VITAMIN C) 1000 MG tablet Take 1,000 mg by mouth daily.    Marland Kitchen aspirin EC 81 MG tablet Take 81 mg by mouth daily.    Marland Kitchen b complex vitamins tablet Take 1 tablet by mouth daily.    Marland Kitchen BAYER MICROLET LANCETS lancets USE TO TEST BLOOD SUGAR 6 TIMES A DAY 300 each 11  . Biotin  5000 MCG TABS Take by mouth.    . Calcium-Magnesium-Vitamin D (CALCIUM 500 PO) Take 1 tablet by mouth daily.     . CONTOUR NEXT TEST test strip USE AS INSTRUCTED TO CHECK SUGAR 6 TIMES DAILY 400 each 5  . diclofenac (VOLTAREN) 75 MG EC tablet Take 1 tablet (75 mg total) by mouth 2 (two) times daily. 30 tablet  0  . insulin lispro (HUMALOG) 100 UNIT/ML injection Inject into the skin 3 (three) times daily before meals. Insulin pump    . losartan (COZAAR) 50 MG tablet TAKE 1 TABLET BY MOUTH ONCE DAILY (Patient taking differently: TAKE 50 mg BY MOUTH ONCE DAILY) 90 tablet 1  . Melatonin 5 MG CHEW Chew 10 tablets by mouth at bedtime.     . metFORMIN (GLUCOPHAGE) 500 MG tablet Take 2 tablets (1,000 mg total) by mouth daily with supper. 180 tablet 1  . Misc Natural Products (TURMERIC CURCUMIN) CAPS Take 1 capsule by mouth daily.     . NON FORMULARY CPAP    . pantoprazole (PROTONIX) 40 MG tablet TAKE 1 TABLET BY MOUTH ONCE DAILY (Patient taking differently: TAKE 40 mg BY MOUTH ONCE DAILY) 90 tablet 3  . QNASL 80 MCG/ACT AERS PLACE 4 PUFFS INTO THE NOSE DAILY. (Patient taking differently: Place 2 puffs into the nose daily as needed (allergies). ) 8.7 g 5  . simvastatin (ZOCOR) 40 MG tablet Take 1 tablet (40 mg total) by mouth daily. 90 tablet 3   No current facility-administered medications on file prior to visit.    Allergies  Allergen Reactions  . Lisinopril Swelling and Other (See Comments)    Extreme facial swelling causing hospitalization  . Shellfish Allergy Anaphylaxis  . Zetia [Ezetimibe] Other (See Comments)    ABDOMINAL CRAMPING  . Penicillins Other (See Comments)    UNSPECIFIED REACTION > Childhood allergy    . Cymbalta [Duloxetine Hcl] Other (See Comments)    Severe feet cramps   Family History  Problem Relation Age of Onset  . Colon cancer Maternal Grandfather   . Colon cancer Paternal Grandfather   . Heart disease Father        Valve replacement; pacemaker  . Alcohol abuse Neg  Hx   . Asthma Neg Hx   . Diabetes Neg Hx   . Hyperlipidemia Neg Hx   . Hypertension Neg Hx   . Kidney disease Neg Hx   . Stroke Neg Hx     PE: BP 128/82 (BP Location: Right Arm, Patient Position: Sitting, Cuff Size: Normal)   Pulse 68   Ht 5\' 10"  (1.778 m)   Wt 219 lb 6.4 oz (99.5 kg)   SpO2 98%   BMI 31.48 kg/m  There is no height or weight on file to calculate BMI.  Wt Readings from Last 3 Encounters:  01/05/18 219 lb 6.4 oz (99.5 kg)  10/03/17 238 lb (108 kg)  08/23/17 246 lb (111.6 kg)   Constitutional: overweight, in NAD Eyes: PERRLA, EOMI, no exophthalmos ENT: moist mucous membranes, no thyromegaly, no cervical lymphadenopathy Cardiovascular: RRR, No MRG Respiratory: CTA B Gastrointestinal: abdomen soft, NT, ND, BS+ Musculoskeletal: no deformities, strength intact in all 4 Skin: moist, warm, no rashes Neurological: no tremor with outstretched hands, DTR normal in all 4  ASSESSMENT: 1. DM1, insulin-dependent, uncontrolled, without complications - DR - PN  -Insulin pump -Was on a Dexcom CGM and previously on Enlite CGM, which found less accurate >> retried this Summer 2017 >> still poor accuracy.  Currently on freestyle libre CGM.  - Barriers to good control:  Very busy schedule >> less time to check sugars and eat but But he does a great job with bolusing during the day >> still has a lot of large boluses during the day, sometimes without documented carbs or sugars  Lack of sleep >> usually sleeps max 5h a night (!) and tries to catch  up in the weekend  Reaches home late at night  - sometimes at 1 am >> eats >65% of the daily calories then  2. HL  PLAN:  1. Patient with long-standing, uncontrolled, type 1 diabetes, with high insulin resistance, on high doses of insulin through his insulin pump.  He returns after a long absence, 11 months.  He is now on the Medtronic 670 G pump and has a freestyle libre CGM. He feels the Guardian CGM was not accurate for him  and giving him a lot of errors. - He continues metformin, which we are using due to his high insulin resistance.  He tolerates this well. - Latest HbA1c was 8.1%, slightly higher - At this visit, however, he is doing much better: sugars have improved significantly and he was actually able to reduce the amt of insulin used by 100 units 1 day! Subsequently, he lost quite a bit of weight (net ~25 lbs since last visit) - he is still getting the majority of TDD from basal rates >> will reduce these further - he maintained his ISF and ICR despite a significant improvement in his weight and his insulin resistance >> he is dropping his sugars excessively after a correction (>> will increase his ISF) and also if entering all the carbs eaten (>> will increase his ICRs). - overall, excellent improvements, but need to change the above settings to avoid hypoglycemia - I advised him to:  Patient Instructions  Please change: - Basal rates: 12 AM: 2.00 >> 1.8 4 AM: 1.8 8 AM: 2.5 >> 2.2 12 PM: 2.4 >> 2.2 4 PM: 2.2 >> 1.8 10:30 PM: 2.3 >> 1.8 - bolus: - insulin to carb ratio:  12 AM: 1.5 >> 5 11 AM: 2.0 >> 5 5 PM: 1.5 >> 5 - ISF: 10 >> 25 - target: 100-120 - IOB: 3 hours  Please continue Metformin 1000 mg 2x a day.  Please return in 3-4 months.   - today, HbA1c is 7.3% (improved!) - continue checking sugars at different times of the day - check 4x a day, rotating checks - advised for yearly eye exams >> he is UTD - Return to clinic in 3-4 mo with sugar log    2. HL - Reviewed latest lipid panel from 09/2017: LDL excellent Lab Results  Component Value Date   CHOL 89 10/03/2017   HDL 34.10 (L) 10/03/2017   LDLCALC 33 10/03/2017   LDLDIRECT 87.0 12/02/2016   TRIG 109.0 10/03/2017   CHOLHDL 3 10/03/2017  - Continues the statin without side effects.   Philemon Kingdom, MD PhD [facility]

## 2018-01-11 ENCOUNTER — Other Ambulatory Visit: Payer: Self-pay

## 2018-01-11 MED ORDER — SILVADENE 1 % EX CREA: TOPICAL_CREAM | CUTANEOUS | 0 refills | Status: DC

## 2018-01-13 ENCOUNTER — Encounter: Payer: Self-pay | Admitting: Internal Medicine

## 2018-01-19 MED FILL — SILVADENE 1% CREAM: 1 | 15 days supply | Qty: 50 | Fill #0

## 2018-01-25 MED FILL — FREESTYLE LIBRE 14 DAY SENS: 28 days supply | Qty: 2 | Fill #0

## 2018-02-05 ENCOUNTER — Encounter: Payer: Self-pay | Admitting: Internal Medicine

## 2018-02-10 ENCOUNTER — Other Ambulatory Visit: Payer: Self-pay | Admitting: Internal Medicine

## 2018-02-10 DIAGNOSIS — F900 Attention-deficit hyperactivity disorder, predominantly inattentive type: Secondary | ICD-10-CM

## 2018-02-12 MED FILL — AMPHETAMINE-DEXTROAMPHETAMI: 10 | 90 days supply | Qty: 180 | Fill #0

## 2018-03-06 ENCOUNTER — Other Ambulatory Visit: Payer: Self-pay | Admitting: Internal Medicine

## 2018-03-06 DIAGNOSIS — F900 Attention-deficit hyperactivity disorder, predominantly inattentive type: Secondary | ICD-10-CM

## 2018-03-08 MED ORDER — AMPHETAMINE-DEXTROAMPHETAMINE 10 MG PO TABS: 10.0000 mg | ORAL_TABLET | Freq: Two times a day (BID) | ORAL | 0 refills | Status: DC

## 2018-03-16 ENCOUNTER — Ambulatory Visit (INDEPENDENT_AMBULATORY_CARE_PROVIDER_SITE_OTHER): Payer: 59

## 2018-03-16 DIAGNOSIS — Z23 Encounter for immunization: Secondary | ICD-10-CM | POA: Diagnosis not present

## 2018-03-16 DIAGNOSIS — Z299 Encounter for prophylactic measures, unspecified: Secondary | ICD-10-CM

## 2018-03-17 NOTE — Progress Notes (Signed)
[name] [place], [address] [address] Phone: [phone] Subjective:   Zachary Graham, am serving as a scribe for Dr. Hulan Saas.   CC: Left shoulder pain  [id number]  Zachary Graham is a 57 y.o. male coming in with complaint of left shoulder pain 4-5 months ago. Did do physical therapy which helped. Pain with flexion. Does have tingling going into his hand on occasion. Uses IBU every 36 hrs. Has had epidural in 2018 which did help.  Patient did have a herniation last year and is concerned that this is contributing.  Patient notes that he got shingles shot last week. Felt like he had the flu on Friday. Symptoms got worse over weekend: body aches, fever of 101, and arm pain with redness at injection site.        Past Medical History:  Diagnosis Date  . Diabetes mellitus   . GERD (gastroesophageal reflux disease)   . Heart murmur   . Hyperlipidemia   . Hypertension   . OSA (obstructive sleep apnea)   . Pneumonia    Past Surgical History:  Procedure Laterality Date  . CERVICAL DISC ARTHROPLASTY N/A 03/16/2017   Procedure: CERVICAL FIVE- CERVICAL SIX [place] ARTHROPLASTY;  Surgeon: Jovita Gamma, MD;  Location: [place];  Service: Neurosurgery;  Laterality: N/A;  CERVICAL 5- CERVICAL 6 DISC ARTHROPLASTY  . COLONOSCOPY    . TONSILLECTOMY     Social History   Socioeconomic History  . Marital status: Married    Spouse name: Not on file  . Number of children: 3  . Years of education: Not on file  . Highest education level: Not on file  Occupational History  . Occupation: RADIATION SAFETY OFFICER    Employer: [employer]  Social Needs  . Financial resource strain: Not on file  . Food insecurity:    Worry: Not on file    Inability: Not on file  . Transportation needs:    Medical: Not on file    Non-medical: Not on file  Tobacco Use  . Smoking status: Never Smoker  . Smokeless tobacco: Never Used  Substance and Sexual  Activity  . Alcohol use: Graham    Alcohol/week: 0.0 standard drinks  . Drug use: Graham  . Sexual activity: Yes    Partners: Female  Lifestyle  . Physical activity:    Days per week: Not on file    Minutes per session: Not on file  . Stress: Not on file  Relationships  . Social connections:    Talks on phone: Not on file    Gets together: Not on file    Attends religious service: Not on file    Active member of club or organization: Not on file    Attends meetings of clubs or organizations: Not on file    Relationship status: Not on file  Other Topics Concern  . Not on file  Social History Narrative   Regular exercise: runs 3 miles 3x week, takes one day off per week to rest   Caffeine use: stopped all diet soda on 04/23/15 per advice from Sports MD   Allergies  Allergen Reactions  . Lisinopril Swelling and Other (See Comments)    Extreme facial swelling causing hospitalization  . Shellfish Allergy Anaphylaxis  . Zetia [Ezetimibe] Other (See Comments)    ABDOMINAL CRAMPING  . Penicillins Other (See Comments)    UNSPECIFIED REACTION > Childhood allergy    . Cymbalta [Duloxetine Hcl] Other (  See Comments)    Severe feet cramps   Family History  Problem Relation Age of Onset  . Colon cancer Maternal Grandfather   . Colon cancer Paternal Grandfather   . Heart disease Father        Valve replacement; pacemaker  . Alcohol abuse Neg Hx   . Asthma Neg Hx   . Diabetes Neg Hx   . Hyperlipidemia Neg Hx   . Hypertension Neg Hx   . Kidney disease Neg Hx   . Stroke Neg Hx     Current Outpatient Medications (Endocrine & Metabolic):  .  glucagon (GLUCAGON EMERGENCY) 1 MG injection, Inject 1 mg into the muscle once as needed. .  insulin lispro (HUMALOG) 100 UNIT/ML injection, Inject into the skin 3 (three) times daily before meals. Insulin pump .  metFORMIN (GLUCOPHAGE) 500 MG tablet, Take 2 tablets (1,000 mg total) by mouth daily with supper.  Current Outpatient Medications  (Cardiovascular):  .  losartan (COZAAR) 50 MG tablet, TAKE 1 TABLET BY MOUTH ONCE DAILY (Patient taking differently: TAKE 50 mg BY MOUTH ONCE DAILY) .  simvastatin (ZOCOR) 40 MG tablet, Take 1 tablet (40 mg total) by mouth daily.  Current Outpatient Medications (Respiratory):  Marland Kitchen  QNASL 80 MCG/ACT AERS, PLACE 4 PUFFS INTO THE NOSE DAILY. (Patient taking differently: Place 2 puffs into the nose daily as needed (allergies). )  Current Outpatient Medications (Analgesics):  .  acetaminophen (TYLENOL) 325 MG tablet, Take 650 mg by mouth every 6 (six) hours as needed for mild pain. Marland Kitchen  aspirin EC 81 MG tablet, Take 81 mg by mouth daily.   Current Outpatient Medications (Other):  Marland Kitchen  Alpha-Lipoic Acid 600 MG CAPS, Take 1 capsule by mouth daily.  Marland Kitchen  amphetamine-dextroamphetamine (ADDERALL) 10 MG tablet, Take 1 tablet (10 mg total) by mouth 2 (two) times daily. Marland Kitchen  amphetamine-dextroamphetamine (ADDERALL) 5 MG tablet, Take 1 tablet (5 mg total) by mouth daily. .  Ascorbic Acid (VITAMIN C) 1000 MG tablet, Take 1,000 mg by mouth daily. Marland Kitchen  b complex vitamins tablet, Take 1 tablet by mouth daily. Marland Kitchen  BAYER MICROLET LANCETS lancets, USE TO TEST BLOOD SUGAR 6 TIMES A DAY .  Biotin 5000 MCG TABS, Take by mouth. .  Calcium-Magnesium-Vitamin D (CALCIUM 500 PO), Take 1 tablet by mouth daily.  .  Continuous Blood Gluc Sensor (FREESTYLE LIBRE 14 DAY SENSOR) MISC, 1 each by Does not apply route every 14 (fourteen) days. Change every 2 weeks .  CONTOUR NEXT TEST test strip, USE AS INSTRUCTED TO CHECK SUGAR 6 TIMES DAILY .  Melatonin 5 MG CHEW, Chew 10 tablets by mouth at bedtime.  .  Misc Natural Products (TURMERIC CURCUMIN) CAPS, Take 1 capsule by mouth daily.  .  NON FORMULARY, CPAP .  pantoprazole (PROTONIX) 40 MG tablet, TAKE 1 TABLET BY MOUTH ONCE DAILY (Patient taking differently: TAKE 40 mg BY MOUTH ONCE DAILY) .  SILVADENE 1 % cream, APPLY 1 APPLICATION TOPICALLY DAILY. Marland Kitchen  doxycycline (VIBRA-TABS) 100 MG  tablet, Take 1 tablet (100 mg total) by mouth 2 (two) times daily for 14 days.    Past medical history, social, surgical and family history all reviewed in electronic medical record.  Graham pertanent information unless stated regarding to the chief complaint.   Review of Systems:  Graham headache, visual changes, nausea, vomiting, diarrhea, constipation, dizziness, abdominal pain, skin rash, fevers, chills, night sweats, weight loss, swollen lymph nodes, body aches, joint swelling, chest pain, shortness of breath, mood changes.  Positive muscle aches  Objective  Blood pressure 108/66, height 5\' 10"  (1.778 m), weight 220 lb (99.8 kg).    General: Graham apparent distress alert and oriented x3 mood and affect normal, dressed appropriately.  HEENT: Pupils equal, extraocular movements intact  Respiratory: Patient's speak in full sentences and does not appear short of breath  Cardiovascular: Graham lower extremity edema, non tender, Graham erythema  Skin: Warm dry intact with Graham signs of infection or rash on extremities or on axial skeleton.  Abdomen: Soft nontender  Neuro: Cranial nerves II through XII are intact, neurovascularly intact in all extremities with 2+ DTRs and 2+ pulses.  Lymph: Graham lymphadenopathy of posterior or anterior cervical chain or axillae bilaterally.  Gait mild antalgic MSK:  Non tender with full range of motion and good stability and symmetric strength and tone of  elbows, wrist, hip, knee and ankles bilaterally.  Left shoulder shows decreased range of motion in all planes.  There is only 5 degrees of external range of motion.  Positive impingement.  Neurovascular intact distally though.  Diffuse tenderness to palpation of the shoulder noted. Contralateral shoulder unremarkable.  Procedure: Real-time Ultrasound Guided Injection of left glenohumeral joint Device: GE Logiq E  Ultrasound guided injection is preferred based studies that show increased duration, increased effect, greater  accuracy, decreased procedural pain, increased response rate with ultrasound guided versus blind injection.  Verbal informed consent obtained.  Time-out conducted.  Noted Graham overlying erythema, induration, or other signs of local infection.  Skin prepped in a sterile fashion.  Local anesthesia: Topical Ethyl chloride.  With sterile technique and under real time ultrasound guidance:  Joint visualized.  21g 2 inch needle inserted posterior approach. Pictures taken for needle placement. Patient did have injection of 2 cc of 0.5% Marcaine, and 1cc of Kenalog 40 mg/dL. Completed without difficulty  Pain immediately resolved suggesting accurate placement of the medication.  Advised to call if fevers/chills, erythema, induration, drainage, or persistent bleeding.  Images permanently stored and available for review in the ultrasound unit.  Impression: Technically successful ultrasound guided injection.     Impression and Recommendations:     This case required medical decision making of moderate complexity. The above documentation has been reviewed and is accurate and complete [name], DO       Note: This dictation was prepared with Dragon dictation along with smaller phrase technology. Any transcriptional errors that result from this process are unintentional.

## 2018-03-20 ENCOUNTER — Ambulatory Visit: Payer: Self-pay

## 2018-03-20 ENCOUNTER — Ambulatory Visit (INDEPENDENT_AMBULATORY_CARE_PROVIDER_SITE_OTHER): Payer: 59 | Admitting: Family Medicine

## 2018-03-20 ENCOUNTER — Encounter: Payer: Self-pay | Admitting: Family Medicine

## 2018-03-20 VITALS — BP 108/66 | Ht 70.0 in | Wt 220.0 lb

## 2018-03-20 DIAGNOSIS — M7501 Adhesive capsulitis of right shoulder: Secondary | ICD-10-CM

## 2018-03-20 DIAGNOSIS — M7500 Adhesive capsulitis of unspecified shoulder: Secondary | ICD-10-CM

## 2018-03-20 DIAGNOSIS — M25512 Pain in left shoulder: Secondary | ICD-10-CM | POA: Diagnosis not present

## 2018-03-20 DIAGNOSIS — E10618 Type 1 diabetes mellitus with other diabetic arthropathy: Secondary | ICD-10-CM | POA: Diagnosis not present

## 2018-03-20 DIAGNOSIS — G8929 Other chronic pain: Secondary | ICD-10-CM

## 2018-03-20 MED ORDER — DOXYCYCLINE HYCLATE 100 MG PO TABS: 100.0000 mg | ORAL_TABLET | Freq: Two times a day (BID) | ORAL | 0 refills | Status: AC

## 2018-03-20 MED FILL — FREESTYLE LIBRE 14 DAY SENS: 28 days supply | Qty: 2 | Fill #1

## 2018-03-20 MED FILL — DOXYCYCLINE HYCLATE 100 MG: 100 | 14 days supply | Qty: 28 | Fill #0

## 2018-03-20 NOTE — Patient Instructions (Addendum)
Good to see you  Ice 20 minutes 2 times daily. Usually after activity and before bed. pennsaid pinkie amount topically 2 times daily as needed.  Try to move the shoulder as much as you can  Injected the shoulder today  PT will be calling you  See me again in 4-6 weeks

## 2018-03-28 DIAGNOSIS — N529 Male erectile dysfunction, unspecified: Secondary | ICD-10-CM | POA: Diagnosis not present

## 2018-03-28 DIAGNOSIS — M7502 Adhesive capsulitis of left shoulder: Secondary | ICD-10-CM | POA: Diagnosis not present

## 2018-03-28 DIAGNOSIS — R5383 Other fatigue: Secondary | ICD-10-CM | POA: Diagnosis not present

## 2018-03-28 DIAGNOSIS — M5010 Cervical disc disorder with radiculopathy, unspecified cervical region: Secondary | ICD-10-CM | POA: Diagnosis not present

## 2018-04-04 ENCOUNTER — Other Ambulatory Visit: Payer: Self-pay | Admitting: Internal Medicine

## 2018-04-04 MED FILL — PANTOPRAZOLE SOD DR 40 MG T: 40 | 90 days supply | Qty: 90 | Fill #0

## 2018-04-05 ENCOUNTER — Encounter: Payer: Self-pay | Admitting: Physical Therapy

## 2018-04-05 ENCOUNTER — Ambulatory Visit (INDEPENDENT_AMBULATORY_CARE_PROVIDER_SITE_OTHER): Payer: 59 | Admitting: Physical Therapy

## 2018-04-05 DIAGNOSIS — M25512 Pain in left shoulder: Secondary | ICD-10-CM | POA: Diagnosis not present

## 2018-04-05 DIAGNOSIS — M25612 Stiffness of left shoulder, not elsewhere classified: Secondary | ICD-10-CM

## 2018-04-05 NOTE — Patient Instructions (Signed)
Access Code: [email or web address]  URL: [email or web address]  Date: 04/05/2018  Prepared by: Lyndee Hensen   Exercises  Supine Shoulder Flexion with Dowel - 10 reps - 2 sets - 2x daily  Supine Shoulder Flexion PROM - 10 reps - 2 sets - 2x daily  Supine Shoulder External Rotation with Dowel - 10 reps - 2 sets - 2x daily  Seated Shoulder Flexion Towel Slide at Table Top - 10 reps - 1 sets - 2x daily  Standing Shoulder Flexion Wall Walk - 10 reps - 1 sets - 2x daily  Standing Shoulder Internal Rotation Stretch with Hands Behind Back - 5-10 reps - 1 sets - 2x daily

## 2018-04-09 ENCOUNTER — Encounter: Payer: Self-pay | Admitting: Physical Therapy

## 2018-04-09 NOTE — Therapy (Signed)
[facility] [address] [place], [place], [address] Phone: [phone]   Fax:  [phone]  Physical Therapy Evaluation  Patient Details  Name: Zachary Graham MRN: [id number] Date of Birth: August 13, 1960 Referring Provider (PT): [name]   Encounter Date: 04/05/2018  PT End of Session - 04/09/18 2135    Visit Number  1    Number of Visits  12    Date for PT Re-Evaluation  05/17/18    Authorization Type  [facility]    PT Start Time  0848    PT Stop Time  0930    PT Time Calculation (min)  42 min    Activity Tolerance  Patient tolerated treatment well    Behavior During Therapy  [facility] for tasks assessed/performed       Past Medical History:  Diagnosis Date  . Diabetes mellitus   . GERD (gastroesophageal reflux disease)   . Heart murmur   . Hyperlipidemia   . Hypertension   . OSA (obstructive sleep apnea)   . Pneumonia     Past Surgical History:  Procedure Laterality Date  . CERVICAL DISC ARTHROPLASTY N/A 03/16/2017   Procedure: CERVICAL FIVE- CERVICAL SIX [place] ARTHROPLASTY;  Surgeon: Jovita Gamma, MD;  Location: [facility];  Service: Neurosurgery;  Laterality: N/A;  CERVICAL 5- CERVICAL 6 DISC ARTHROPLASTY  . COLONOSCOPY    . TONSILLECTOMY      There were no vitals filed for this visit.       [facility] PT Assessment - 04/09/18 0001      Assessment   Medical Diagnosis  L shoulder pain    Referring Provider (PT)  [name]    Prior Therapy  for neck      Precautions   Precautions  None      Balance Screen   Has the patient fallen in the past 6 months  No      Prior Function   Level of Independence  Independent      Cognition   Overall Cognitive Status  Within Functional Limits for tasks assessed      AROM   Overall AROM Comments  R : 145 for flex/abd    Left Shoulder Flexion  1115 Degrees    Left Shoulder ABduction  110 Degrees    Left Shoulder Internal Rotation  50 Degrees    Left Shoulder External Rotation   45 Degrees      PROM   Left Shoulder Flexion  140 Degrees    Left Shoulder ABduction  135 Degrees    Left Shoulder Internal Rotation  50 Degrees    Left Shoulder External Rotation  45 Degrees      Strength   Left Shoulder Flexion  4-/5    Left Shoulder ABduction  4-/5    Left Shoulder Internal Rotation  4/5    Left Shoulder External Rotation  4/5                Objective measurements completed on examination: See above findings.      [facility] Adult PT Treatment/Exercise - 04/09/18 0001      Exercises   Exercises  Shoulder      Shoulder Exercises: Supine   External Rotation  20 reps    External Rotation Limitations  cane    Flexion  20 reps    Shoulder Flexion Weight (lbs)  cane      Shoulder Exercises: Stretch   Internal Rotation Stretch  5 reps    Internal  Rotation Stretch Limitations  hands behind back;     Table Stretch - Flexion  10 seconds;5 reps             PT Education - 04/09/18 2135    Education Details  PT POC, HEP , posture    Person(s) Educated  Patient    Methods  Explanation;Demonstration;Tactile cues;Verbal cues;Handout    Comprehension  Verbalized understanding;Need further instruction;Tactile cues required;Verbal cues required;Returned demonstration       PT Short Term Goals - 04/09/18 2138      PT SHORT TERM GOAL #1   Title  Pt to be independent with initial HEP     Time  2    Period  Weeks    Status  New    Target Date  04/19/18        PT Long Term Goals - 04/09/18 2138      PT LONG TERM GOAL #1   Title  Pt to demo L shoulder AROm, to be WNL, to improve ability for reaching, lifting, and IADLs.     Time  6    Period  Weeks    Status  New    Target Date  05/17/18      PT LONG TERM GOAL #2   Title  Pt to demo improved strength of L shoulder, to be at least 4+/5 to improve ability for IADLs and work duties.     Time  6    Period  Weeks    Status  New    Target Date  05/17/18      PT LONG TERM GOAL #3   Title  Pt to  report decreased pain in L shoulder to be 0-2/10 with activity     Time  6    Status  New    Target Date  05/17/18             Plan - 04/09/18 2141    Clinical Impression Statement  Pt presents with primary complaint of increased pain in L shoulder, which has improved somewhat with recent injection. Pt with stiffness and hypomobility of L GHJ. He has decreased PROM due to stiffness, and decreased ability for AROM. He has decreased strength, stability, and ability for functional activity with L UE. Pt to benefit from skilled PT to improve deficits and return to PLOF without pain.     Clinical Presentation  Stable    Clinical Decision Making  Low    Rehab Potential  Good    PT Frequency  2x / week    PT Duration  6 weeks    PT Treatment/Interventions  ADLs/Self Care Home Management;Cryotherapy;Electrical Stimulation;Iontophoresis 4mg /ml Dexamethasone;Moist Heat;Therapeutic activities;Functional mobility training;Ultrasound;Therapeutic exercise;Neuromuscular re-education;Patient/family education;Dry needling;Passive range of motion;Manual techniques;Taping;Joint Manipulations    Consulted and Agree with Plan of Care  Patient       Patient will benefit from skilled therapeutic intervention in order to improve the following deficits and impairments:  Decreased endurance, Hypomobility, Decreased activity tolerance, Decreased strength, Impaired UE functional use, Pain, Increased muscle spasms, Decreased mobility, Decreased range of motion, Improper body mechanics, Impaired flexibility  Visit Diagnosis: Acute pain of left shoulder  Stiffness of left shoulder, not elsewhere classified     Problem List Patient Active Problem List   Diagnosis Date Noted  . 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 HNP (herniated nucleus pulposus), cervical 03/16/2017  . 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 ([place])  01/06/2017  .  Herniation of cervical intervertebral disc with radiculopathy 01/04/2017  . Current mild episode of major depressive disorder without prior episode ([place]) 12/02/2016  . Diabetic neuropathy ([place]) 03/01/2016  . Obesity, Class II, BMI 35.0-39.9, with comorbidity (see actual BMI) 04/24/2015  . Type 1 diabetes mellitus with hyperglycemia, with long-term current use of insulin ([place]) 04/04/2015  . Patellofemoral stress syndrome of left knee 03/19/2015  . Allergic rhinitis due to pollen 11/14/2014  . Equinus deformity of foot, acquired 05/29/2014  . ADHD (attention deficit hyperactivity disorder), inattentive type 10/24/2013  . Vitamin D deficiency 02/28/2013  . Pulmonic stenosis 05/25/2012  . Pure hyperglyceridemia 05/25/2012  . Hyperlipidemia with target LDL less than 100 09/20/2011  . OSA (obstructive sleep apnea) 09/20/2011  . BPH (benign prostatic hyperplasia) 09/20/2011  . Routine general medical examination at a health care facility 09/20/2011    Lyndee Hensen, PT, DPT 9:44 PM  04/09/18    [facility] [place], [place], [address] Phone: [phone]   Fax:  [phone]  Name: Zachary Graham MRN: [id number] Date of Birth: [birth year]

## 2018-04-11 ENCOUNTER — Ambulatory Visit (INDEPENDENT_AMBULATORY_CARE_PROVIDER_SITE_OTHER): Payer: 59 | Admitting: Physical Therapy

## 2018-04-11 ENCOUNTER — Encounter: Payer: Self-pay | Admitting: Physical Therapy

## 2018-04-11 DIAGNOSIS — Z125 Encounter for screening for malignant neoplasm of prostate: Secondary | ICD-10-CM | POA: Diagnosis not present

## 2018-04-11 DIAGNOSIS — M25612 Stiffness of left shoulder, not elsewhere classified: Secondary | ICD-10-CM | POA: Diagnosis not present

## 2018-04-11 DIAGNOSIS — Z794 Long term (current) use of insulin: Secondary | ICD-10-CM | POA: Diagnosis not present

## 2018-04-11 DIAGNOSIS — E109 Type 1 diabetes mellitus without complications: Secondary | ICD-10-CM | POA: Diagnosis not present

## 2018-04-11 DIAGNOSIS — N529 Male erectile dysfunction, unspecified: Secondary | ICD-10-CM | POA: Diagnosis not present

## 2018-04-11 DIAGNOSIS — Z1321 Encounter for screening for nutritional disorder: Secondary | ICD-10-CM | POA: Diagnosis not present

## 2018-04-11 DIAGNOSIS — M25512 Pain in left shoulder: Secondary | ICD-10-CM

## 2018-04-11 MED FILL — CONTOUR NEXT STRIPS: 66 days supply | Qty: 400 | Fill #4

## 2018-04-11 MED FILL — FREESTYLE LIBRE 14 DAY SENS: 28 days supply | Qty: 2 | Fill #2

## 2018-04-11 NOTE — Therapy (Signed)
[facility] [address] [place], [place], [address] Phone: [phone]   Fax:  [phone]  Physical Therapy Treatment  Patient Details  Name: Zachary Graham MRN: [id number] Date of Birth: Jun 06, 1960 Referring Provider (PT): [name]   Encounter Date: 04/11/2018  PT End of Session - 04/11/18 1303    Visit Number  2    Number of Visits  12    Date for PT Re-Evaluation  05/17/18    Authorization Type  [facility]    PT Start Time  0277    PT Stop Time  1337    PT Time Calculation (min)  39 min    Activity Tolerance  Patient tolerated treatment well    Behavior During Therapy  [facility] for tasks assessed/performed       Past Medical History:  Diagnosis Date  . Diabetes mellitus   . GERD (gastroesophageal reflux disease)   . Heart murmur   . Hyperlipidemia   . Hypertension   . OSA (obstructive sleep apnea)   . Pneumonia     Past Surgical History:  Procedure Laterality Date  . CERVICAL DISC ARTHROPLASTY N/A 03/16/2017   Procedure: CERVICAL FIVE- CERVICAL SIX [place] ARTHROPLASTY;  Surgeon: Jovita Gamma, MD;  Location: [facility];  Service: Neurosurgery;  Laterality: N/A;  CERVICAL 5- CERVICAL 6 DISC ARTHROPLASTY  . COLONOSCOPY    . TONSILLECTOMY      There were no vitals filed for this visit.  Subjective Assessment - 04/11/18 1301    Subjective  Pt states shoulder is "not too bad" today. Still notes pain with reaching, or different motions.     Limitations  Lifting;Writing;House hold activities    Patient Stated Goals  decreased pain     Currently in Pain?  Yes    Pain Score  2     Pain Location  Shoulder    Pain Orientation  Left    Pain Descriptors / Indicators  Aching    Pain Type  Acute pain    Pain Onset  More than a month ago    Pain Frequency  Intermittent         [facility] PT Assessment - 04/11/18 0001      PROM   Left Shoulder Flexion  148 Degrees    Left Shoulder ABduction  142 Degrees                    [facility] Adult PT Treatment/Exercise - 04/11/18 1258      Exercises   Exercises  Shoulder      Shoulder Exercises: Supine   External Rotation  20 reps    External Rotation Limitations  cane    Flexion  20 reps    Shoulder Flexion Weight (lbs)  cane      Shoulder Exercises: Standing   Row  20 reps    Theraband Level (Shoulder Row)  Level 3 (Green)    Other Standing Exercises  Wall slides x15 L UE;       Shoulder Exercises: Pulleys   Flexion  2 minutes    ABduction  2 minutes      Shoulder Exercises: Stretch   Corner Stretch  3 reps;30 seconds    Corner Stretch Limitations  at 45 (1 UE) and 90 deg ( 2 UE)     Internal Rotation Stretch  --    Internal Rotation Stretch Limitations  --    Wall Stretch - Flexion  --    Table Stretch -  Flexion  10 seconds;5 reps      Manual Therapy   Manual Therapy  Joint mobilization;Passive ROM    Joint Mobilization  L GHJ mobs, all motions, gr 3     Passive ROM  L shoulder, all motions.                PT Short Term Goals - 04/09/18 2138      PT SHORT TERM GOAL #1   Title  Pt to be independent with initial HEP     Time  2    Period  Weeks    Status  New    Target Date  04/19/18        PT Long Term Goals - 04/09/18 2138      PT LONG TERM GOAL #1   Title  Pt to demo L shoulder AROm, to be WNL, to improve ability for reaching, lifting, and IADLs.     Time  6    Period  Weeks    Status  New    Target Date  05/17/18      PT LONG TERM GOAL #2   Title  Pt to demo improved strength of L shoulder, to be at least 4+/5 to improve ability for IADLs and work duties.     Time  6    Period  Weeks    Status  New    Target Date  05/17/18      PT LONG TERM GOAL #3   Title  Pt to report decreased pain in L shoulder to be 0-2/10 with activity     Time  6    Status  New    Target Date  05/17/18            Plan - 04/11/18 1339    Clinical Impression Statement  Pt with mild improvements in PROM today.  He has increased pain at end of available range, especially for ER. Pt progressed with PROM and AAROM, will progress to AROM as pt able.     Rehab Potential  Good    PT Frequency  2x / week    PT Duration  6 weeks    PT Treatment/Interventions  ADLs/Self Care Home Management;Cryotherapy;Electrical Stimulation;Iontophoresis 4mg /ml Dexamethasone;Moist Heat;Therapeutic activities;Functional mobility training;Ultrasound;Therapeutic exercise;Neuromuscular re-education;Patient/family education;Dry needling;Passive range of motion;Manual techniques;Taping;Joint Manipulations    Consulted and Agree with Plan of Care  Patient       Patient will benefit from skilled therapeutic intervention in order to improve the following deficits and impairments:  Decreased endurance, Hypomobility, Decreased activity tolerance, Decreased strength, Impaired UE functional use, Pain, Increased muscle spasms, Decreased mobility, Decreased range of motion, Improper body mechanics, Impaired flexibility  Visit Diagnosis: Acute pain of left shoulder  Stiffness of left shoulder, not elsewhere classified     Problem List Patient Active Problem List   Diagnosis Date Noted  . 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 HNP (herniated nucleus pulposus), cervical 03/16/2017  . 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 ([place]) 01/06/2017  . Herniation of cervical intervertebral disc with radiculopathy 01/04/2017  . Current mild episode of major depressive disorder without prior episode ([place]) 12/02/2016  . Diabetic neuropathy ([place]) 03/01/2016  . Obesity, Class II, BMI 35.0-39.9, with comorbidity (see actual BMI) 04/24/2015  . Type 1 diabetes mellitus with hyperglycemia, with long-term current use of insulin ([place]) 04/04/2015  . Patellofemoral stress syndrome of left knee 03/19/2015  . Allergic rhinitis due to  pollen 11/14/2014  . Equinus  deformity of foot, acquired 05/29/2014  . ADHD (attention deficit hyperactivity disorder), inattentive type 10/24/2013  . Vitamin D deficiency 02/28/2013  . Pulmonic stenosis 05/25/2012  . Pure hyperglyceridemia 05/25/2012  . Hyperlipidemia with target LDL less than 100 09/20/2011  . OSA (obstructive sleep apnea) 09/20/2011  . BPH (benign prostatic hyperplasia) 09/20/2011  . Routine general medical examination at a health care facility 09/20/2011    Lyndee Hensen, PT, DPT 1:42 PM  04/11/18    [facility] [place], [place], [address] Phone: [phone]   Fax:  [phone]  Name: Zachary Graham MRN: [id number] Date of Birth: [birth year]

## 2018-04-18 ENCOUNTER — Encounter: Payer: 59 | Admitting: Physical Therapy

## 2018-04-21 DIAGNOSIS — K648 Other hemorrhoids: Secondary | ICD-10-CM | POA: Diagnosis not present

## 2018-04-21 DIAGNOSIS — Z8719 Personal history of other diseases of the digestive system: Secondary | ICD-10-CM | POA: Diagnosis not present

## 2018-04-21 DIAGNOSIS — Z09 Encounter for follow-up examination after completed treatment for conditions other than malignant neoplasm: Secondary | ICD-10-CM | POA: Diagnosis not present

## 2018-04-21 DIAGNOSIS — K5730 Diverticulosis of large intestine without perforation or abscess without bleeding: Secondary | ICD-10-CM | POA: Diagnosis not present

## 2018-04-24 NOTE — Progress Notes (Signed)
[facility] [place], [place] [address] Phone: [phone] Subjective:    I Zachary Graham am serving as a Education administrator for Dr. Hulan Saas.    CC: Left shoulder pain  Zachary Graham:Zachary Graham  Zachary Graham is a 57 y.o. male coming in with complaint of left shoulder pain. Shoulder is still sore but is making progress. Pain with flexion.  Patient was found to have frozen shoulder.  Is making progress.  In physical therapy.  States that he is pain is about 80% improved but only has about a 10% improvement in range of motion.  No new symptoms.      Past Medical History:  Diagnosis Date  . Diabetes mellitus   . GERD (gastroesophageal reflux disease)   . Heart murmur   . Hyperlipidemia   . Hypertension   . OSA (obstructive sleep apnea)   . Pneumonia    Past Surgical History:  Procedure Laterality Date  . CERVICAL DISC ARTHROPLASTY N/A 03/16/2017   Procedure: CERVICAL FIVE- CERVICAL SIX [place] ARTHROPLASTY;  Surgeon: Jovita Gamma, MD;  Location: [facility];  Service: Neurosurgery;  Laterality: N/A;  CERVICAL 5- CERVICAL 6 DISC ARTHROPLASTY  . COLONOSCOPY    . TONSILLECTOMY     Social History   Socioeconomic History  . Marital status: Married    Spouse name: Not on file  . Number of children: 3  . Years of education: Not on file  . Highest education level: Not on file  Occupational History  . Occupation: RADIATION SAFETY OFFICER    Employer: [employer]  Social Needs  . Financial resource strain: Not on file  . Food insecurity:    Worry: Not on file    Inability: Not on file  . Transportation needs:    Medical: Not on file    Non-medical: Not on file  Tobacco Use  . Smoking status: Never Smoker  . Smokeless tobacco: Never Used  Substance and Sexual Activity  . Alcohol use: No    Alcohol/week: 0.0 standard drinks  . Drug use: No  . Sexual activity: Yes    Partners: Female  Lifestyle  . Physical activity:    Days per week: Not on file   Minutes per session: Not on file  . Stress: Not on file  Relationships  . Social connections:    Talks on phone: Not on file    Gets together: Not on file    Attends religious service: Not on file    Active member of club or organization: Not on file    Attends meetings of clubs or organizations: Not on file    Relationship status: Not on file  Other Topics Concern  . Not on file  Social History Narrative   Regular exercise: runs 3 miles 3x week, takes one day off per week to rest   Caffeine use: stopped all diet soda on 04/23/15 per advice from Sports MD   Allergies  Allergen Reactions  . Lisinopril Swelling and Other (See Comments)    Extreme facial swelling causing hospitalization  . Shellfish Allergy Anaphylaxis  . Zetia [Ezetimibe] Other (See Comments)    ABDOMINAL CRAMPING  . Penicillins Other (See Comments)    UNSPECIFIED REACTION > Childhood allergy    . Cymbalta [Duloxetine Hcl] Other (See Comments)    Severe feet cramps   Family History  Problem Relation Age of Onset  . Colon cancer Maternal Grandfather   . Colon cancer Paternal Grandfather   . Heart  disease Father        Valve replacement; pacemaker  . Alcohol abuse Neg Hx   . Asthma Neg Hx   . Diabetes Neg Hx   . Hyperlipidemia Neg Hx   . Hypertension Neg Hx   . Kidney disease Neg Hx   . Stroke Neg Hx     Current Outpatient Medications (Endocrine & Metabolic):  .  glucagon (GLUCAGON EMERGENCY) 1 MG injection, Inject 1 mg into the muscle once as needed. .  insulin lispro (HUMALOG) 100 UNIT/ML injection, Inject into the skin 3 (three) times daily before meals. Insulin pump .  metFORMIN (GLUCOPHAGE) 500 MG tablet, Take 2 tablets (1,000 mg total) by mouth daily with supper.  Current Outpatient Medications (Cardiovascular):  .  losartan (COZAAR) 50 MG tablet, TAKE 1 TABLET BY MOUTH ONCE DAILY (Patient taking differently: TAKE 50 mg BY MOUTH ONCE DAILY) .  simvastatin (ZOCOR) 40 MG tablet, Take 1 tablet (40 mg  total) by mouth daily.  Current Outpatient Medications (Respiratory):  Marland Kitchen  QNASL 80 MCG/ACT AERS, PLACE 4 PUFFS INTO THE NOSE DAILY. (Patient taking differently: Place 2 puffs into the nose daily as needed (allergies). )  Current Outpatient Medications (Analgesics):  .  acetaminophen (TYLENOL) 325 MG tablet, Take 650 mg by mouth every 6 (six) hours as needed for mild pain. Marland Kitchen  aspirin EC 81 MG tablet, Take 81 mg by mouth daily.   Current Outpatient Medications (Other):  Marland Kitchen  Alpha-Lipoic Acid 600 MG CAPS, Take 1 capsule by mouth daily.  Marland Kitchen  amphetamine-dextroamphetamine (ADDERALL) 10 MG tablet, Take 1 tablet (10 mg total) by mouth 2 (two) times daily. .  Ascorbic Acid (VITAMIN C) 1000 MG tablet, Take 1,000 mg by mouth daily. Marland Kitchen  b complex vitamins tablet, Take 1 tablet by mouth daily. Marland Kitchen  BAYER MICROLET LANCETS lancets, USE TO TEST BLOOD SUGAR 6 TIMES A DAY .  Calcium-Magnesium-Vitamin D (CALCIUM 500 PO), Take 1 tablet by mouth daily.  .  Continuous Blood Gluc Sensor (FREESTYLE LIBRE 14 DAY SENSOR) MISC, 1 each by Does not apply route every 14 (fourteen) days. Change every 2 weeks .  CONTOUR NEXT TEST test strip, USE AS INSTRUCTED TO CHECK SUGAR 6 TIMES DAILY .  Melatonin 5 MG CHEW, Chew 10 tablets by mouth at bedtime.  .  Misc Natural Products (TURMERIC CURCUMIN) CAPS, Take 1 capsule by mouth daily.  .  NON FORMULARY, CPAP .  pantoprazole (PROTONIX) 40 MG tablet, TAKE 40 mg BY MOUTH ONCE DAILY .  SILVADENE 1 % cream, APPLY 1 APPLICATION TOPICALLY DAILY. Marland Kitchen  amphetamine-dextroamphetamine (ADDERALL) 5 MG tablet, Take 1 tablet (5 mg total) by mouth daily. .  Vitamin D, Ergocalciferol, (DRISDOL) 1.25 MG (50000 UT) CAPS capsule, Take 1 capsule (50,000 Units total) by mouth every 7 (seven) days.    Past medical history, social, surgical and family history all reviewed in electronic medical record.  No pertanent information unless stated regarding to the chief complaint.   Review of Systems:  No  headache, visual changes, nausea, vomiting, diarrhea, constipation, dizziness, abdominal pain, skin rash, fevers, chills, night sweats, weight loss, swollen lymph nodes, body aches, joint swelling,  chest pain, shortness of breath, mood changes.  Positive muscle aches  Objective  Blood pressure 130/70, pulse 81, height 5\' 10"  (1.778 m), weight 224 lb (101.6 kg), SpO2 97 %.    General: No apparent distress alert and oriented x3 mood and affect normal, dressed appropriately.  HEENT: Pupils equal, extraocular movements intact  Respiratory: Patient's  speak in full sentences and does not appear short of breath  Cardiovascular: No lower extremity edema, non tender, no erythema  Skin: Warm dry intact with no signs of infection or rash on extremities or on axial skeleton.  Abdomen: Soft nontender  Neuro: Cranial nerves II through XII are intact, neurovascularly intact in all extremities with 2+ DTRs and 2+ pulses.  Lymph: No lymphadenopathy of posterior or anterior cervical chain or axillae bilaterally.  Gait mild antalgic MSK:  Non tender with full range of motion and good stability and symmetric strength and tone of  elbows, wrist, hip, knee and ankles bilaterally.  Left shoulder shows decreased range of motion.  Still lacks last 5 degrees of external rotation and only has 10 degrees of internal rotation.  Forward flexion of 170 degrees.  Good grip strength.  Rotator cuff strength intact.     Impression and Recommendations:      The above documentation has been reviewed and is accurate and complete [name], DO       Note: This dictation was prepared with Dragon dictation along with smaller phrase technology. Any transcriptional errors that result from this process are unintentional.

## 2018-04-25 ENCOUNTER — Other Ambulatory Visit: Payer: Self-pay | Admitting: Internal Medicine

## 2018-04-25 ENCOUNTER — Ambulatory Visit (INDEPENDENT_AMBULATORY_CARE_PROVIDER_SITE_OTHER): Payer: 59 | Admitting: Physical Therapy

## 2018-04-25 ENCOUNTER — Encounter: Payer: Self-pay | Admitting: Family Medicine

## 2018-04-25 ENCOUNTER — Encounter: Payer: Self-pay | Admitting: Physical Therapy

## 2018-04-25 ENCOUNTER — Ambulatory Visit (INDEPENDENT_AMBULATORY_CARE_PROVIDER_SITE_OTHER): Payer: 59 | Admitting: Family Medicine

## 2018-04-25 ENCOUNTER — Telehealth: Payer: Self-pay | Admitting: Internal Medicine

## 2018-04-25 DIAGNOSIS — M25612 Stiffness of left shoulder, not elsewhere classified: Secondary | ICD-10-CM | POA: Diagnosis not present

## 2018-04-25 DIAGNOSIS — E10618 Type 1 diabetes mellitus with other diabetic arthropathy: Secondary | ICD-10-CM | POA: Diagnosis not present

## 2018-04-25 DIAGNOSIS — M7500 Adhesive capsulitis of unspecified shoulder: Secondary | ICD-10-CM

## 2018-04-25 DIAGNOSIS — M25512 Pain in left shoulder: Secondary | ICD-10-CM

## 2018-04-25 DIAGNOSIS — F900 Attention-deficit hyperactivity disorder, predominantly inattentive type: Secondary | ICD-10-CM

## 2018-04-25 MED ORDER — AMPHETAMINE-DEXTROAMPHETAMINE 5 MG PO TABS: 5.0000 mg | ORAL_TABLET | Freq: Every day | ORAL | 0 refills | Status: DC

## 2018-04-25 MED ORDER — VITAMIN D (ERGOCALCIFEROL) 1.25 MG (50000 UNIT) PO CAPS: 50000.0000 [IU] | ORAL_CAPSULE | ORAL | 0 refills | Status: DC

## 2018-04-25 MED FILL — VIT D2 1.25 MG (50,000 UNIT: 1.25 MG | 84 days supply | Qty: 12 | Fill #0

## 2018-04-25 NOTE — Therapy (Signed)
[facility] [address] [place], [place], [address] Phone: [phone]   Fax:  [phone]  Physical Therapy Treatment  Patient Details  Name: Zachary Graham MRN: [id number] Date of Birth: Jan 08, 1961 Referring Provider (PT): [name]   Encounter Date: 04/25/2018  PT End of Session - 04/25/18 1640    Visit Number  3    Number of Visits  12    Date for PT Re-Evaluation  05/17/18    Authorization Type  [facility]    PT Start Time  1550    PT Stop Time  1638    PT Time Calculation (min)  48 min    Activity Tolerance  Patient tolerated treatment well    Behavior During Therapy  [facility] for tasks assessed/performed       Past Medical History:  Diagnosis Date  . Diabetes mellitus   . GERD (gastroesophageal reflux disease)   . Heart murmur   . Hyperlipidemia   . Hypertension   . OSA (obstructive sleep apnea)   . Pneumonia     Past Surgical History:  Procedure Laterality Date  . CERVICAL DISC ARTHROPLASTY N/A 03/16/2017   Procedure: CERVICAL FIVE- CERVICAL SIX [place] ARTHROPLASTY;  Surgeon: Jovita Gamma, MD;  Location: [facility];  Service: Neurosurgery;  Laterality: N/A;  CERVICAL 5- CERVICAL 6 DISC ARTHROPLASTY  . COLONOSCOPY    . TONSILLECTOMY      There were no vitals filed for this visit.                    [facility] Adult PT Treatment/Exercise - 04/25/18 1551      Exercises   Exercises  Shoulder      Shoulder Exercises: Supine   External Rotation  20 reps    External Rotation Weight (lbs)  2    External Rotation Limitations  IR/ER AROM in neutral    Flexion  20 reps;AROM    Shoulder Flexion Weight (lbs)  1lb      Shoulder Exercises: Seated   External Rotation  20 reps;AAROM    External Rotation Limitations  cane      Shoulder Exercises: Standing   External Rotation  20 reps;Theraband    Theraband Level (Shoulder External Rotation)  Level 1 (Yellow)    Internal Rotation  20 reps;Theraband    Theraband  Level (Shoulder Internal Rotation)  Level 1 (Yellow)    Row  --    Theraband Level (Shoulder Row)  --    Other Standing Exercises  --      Shoulder Exercises: Pulleys   Flexion  2 minutes    ABduction  --      Shoulder Exercises: Stretch   Corner Stretch  --    Corner Stretch Limitations  --    Internal Rotation Stretch  10 seconds    Internal Rotation Stretch Limitations  behind back;     Table Stretch - Flexion  --      Manual Therapy   Manual Therapy  Joint mobilization;Passive ROM    Joint Mobilization  L GHJ mobs, all motions, gr 3 ; Mulligan mobilization to increase IR/standing     Passive ROM  L shoulder, all motions; Contract relax for IR/ER;                 PT Short Term Goals - 04/09/18 2138      PT SHORT TERM GOAL #1   Title  Pt to be independent with initial HEP  Time  2    Period  Weeks    Status  New    Target Date  04/19/18        PT Long Term Goals - 04/09/18 2138      PT LONG TERM GOAL #1   Title  Pt to demo L shoulder AROm, to be WNL, to improve ability for reaching, lifting, and IADLs.     Time  6    Period  Weeks    Status  New    Target Date  05/17/18      PT LONG TERM GOAL #2   Title  Pt to demo improved strength of L shoulder, to be at least 4+/5 to improve ability for IADLs and work duties.     Time  6    Period  Weeks    Status  New    Target Date  05/17/18      PT LONG TERM GOAL #3   Title  Pt to report decreased pain in L shoulder to be 0-2/10 with activity     Time  6    Status  New    Target Date  05/17/18            Plan - 04/25/18 1641    Clinical Impression Statement  Pt making improvements with flexion PROm and AROM. He continues to have stiffness and pain for IR/ER. Focus of treatment was on this today, manual, as well as teaching exercises for HEP. Recommended pt increase frequency of ER/IR during the day, to improve motion. Pt with soreness during session today, with PROM, declines ice at end of session  due to time constraint.     Rehab Potential  Good    PT Frequency  2x / week    PT Duration  6 weeks    PT Treatment/Interventions  ADLs/Self Care Home Management;Cryotherapy;Electrical Stimulation;Iontophoresis 4mg /ml Dexamethasone;Moist Heat;Therapeutic activities;Functional mobility training;Ultrasound;Therapeutic exercise;Neuromuscular re-education;Patient/family education;Dry needling;Passive range of motion;Manual techniques;Taping;Joint Manipulations    Consulted and Agree with Plan of Care  Patient       Patient will benefit from skilled therapeutic intervention in order to improve the following deficits and impairments:  Decreased endurance, Hypomobility, Decreased activity tolerance, Decreased strength, Impaired UE functional use, Pain, Increased muscle spasms, Decreased mobility, Decreased range of motion, Improper body mechanics, Impaired flexibility  Visit Diagnosis: Acute pain of left shoulder  Stiffness of left shoulder, not elsewhere classified     Problem List Patient Active Problem List   Diagnosis Date Noted  . 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 HNP (herniated nucleus pulposus), cervical 03/16/2017  . 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 ([place]) 01/06/2017  . Herniation of cervical intervertebral disc with radiculopathy 01/04/2017  . Current mild episode of major depressive disorder without prior episode ([place]) 12/02/2016  . Diabetic neuropathy ([place]) 03/01/2016  . Obesity, Class II, BMI 35.0-39.9, with comorbidity (see actual BMI) 04/24/2015  . Type 1 diabetes mellitus with hyperglycemia, with long-term current use of insulin ([place]) 04/04/2015  . Patellofemoral stress syndrome of left knee 03/19/2015  . Allergic rhinitis due to pollen 11/14/2014  . Equinus deformity of foot, acquired 05/29/2014  . ADHD (attention deficit hyperactivity disorder), inattentive type 10/24/2013   . Vitamin D deficiency 02/28/2013  . Pulmonic stenosis 05/25/2012  . Pure hyperglyceridemia 05/25/2012  . Hyperlipidemia with target LDL less than 100 09/20/2011  . OSA (obstructive sleep apnea) 09/20/2011  . BPH (benign prostatic hyperplasia) 09/20/2011  .  Routine general medical examination at a health care facility 09/20/2011   Lyndee Hensen, PT, DPT 4:43 PM  04/25/18    [facility] [place], [place], [address] Phone: [phone]   Fax:  [phone]  Name: LENNEX PIETILA MRN: [id number] Date of Birth: [birth year]

## 2018-04-25 NOTE — Assessment & Plan Note (Signed)
Patient does still have significant lack of range of motion.  I do believe still likely secondary to her frozen shoulder.  No x-rays taken today likely would not change management.  We discussed icing regimen and home exercises.  Discussed which activities to do which wants to avoid.  Patient is to follow-up with me again 6 weeks.  May need repeat injection at that time

## 2018-04-25 NOTE — Telephone Encounter (Signed)
Pt needs a refill of his amphetamine-dextroamphetamine (ADDERALL) 5 MG tablet Prescription, Fu appt made for 06/08/18. Please send to .

## 2018-04-25 NOTE — Patient Instructions (Signed)
Good to see you  Ice is your friend Once weekly vitamin D for 12 weeks. Stop the other vitamin D  I like the other supplements See me again in 6 weeks and if not a lot better we will discuss another injection Happy holidays!

## 2018-04-25 NOTE — Telephone Encounter (Signed)
Check [facility] registry last filled 02/12/2018.Marland Kitchen/LMB

## 2018-04-27 ENCOUNTER — Encounter: Payer: Self-pay | Admitting: Physical Therapy

## 2018-04-27 ENCOUNTER — Ambulatory Visit (INDEPENDENT_AMBULATORY_CARE_PROVIDER_SITE_OTHER): Payer: 59 | Admitting: Physical Therapy

## 2018-04-27 ENCOUNTER — Ambulatory Visit: Payer: 59 | Admitting: Family Medicine

## 2018-04-27 DIAGNOSIS — M25512 Pain in left shoulder: Secondary | ICD-10-CM

## 2018-04-27 DIAGNOSIS — M25612 Stiffness of left shoulder, not elsewhere classified: Secondary | ICD-10-CM

## 2018-05-01 ENCOUNTER — Encounter: Payer: Self-pay | Admitting: Physical Therapy

## 2018-05-01 NOTE — Therapy (Signed)
[facility] [address] [place], [place], [address] Phone: [phone]   Fax:  [phone]  Physical Therapy Treatment  Patient Details  Name: Zachary Graham MRN: [id number] Date of Birth: May 27, 1960 Referring Provider (PT): [name]   Encounter Date: 04/27/2018  PT End of Session - 05/01/18 1204    Visit Number  4    Number of Visits  12    Date for PT Re-Evaluation  05/17/18    Authorization Type  [facility]    PT Start Time  1602    PT Stop Time  1642    PT Time Calculation (min)  40 min    Activity Tolerance  Patient tolerated treatment well    Behavior During Therapy  [facility] for tasks assessed/performed       Past Medical History:  Diagnosis Date  . Diabetes mellitus   . GERD (gastroesophageal reflux disease)   . Heart murmur   . Hyperlipidemia   . Hypertension   . OSA (obstructive sleep apnea)   . Pneumonia     Past Surgical History:  Procedure Laterality Date  . CERVICAL DISC ARTHROPLASTY N/A 03/16/2017   Procedure: CERVICAL FIVE- CERVICAL SIX [place] ARTHROPLASTY;  Surgeon: Jovita Gamma, MD;  Location: [facility];  Service: Neurosurgery;  Laterality: N/A;  CERVICAL 5- CERVICAL 6 DISC ARTHROPLASTY  . COLONOSCOPY    . TONSILLECTOMY      There were no vitals filed for this visit.  Subjective Assessment - 05/01/18 1204    Subjective  Pt states mild increase in pain after last visit. Mild pain today.     Currently in Pain?  Yes    Pain Score  2     Pain Location  Shoulder    Pain Orientation  Left    Pain Descriptors / Indicators  Aching    Pain Type  Acute pain    Pain Onset  More than a month ago                                 PT Short Term Goals - 04/09/18 2138      PT SHORT TERM GOAL #1   Title  Pt to be independent with initial HEP     Time  2    Period  Weeks    Status  New    Target Date  04/19/18        PT Long Term Goals - 04/09/18 2138      PT LONG TERM GOAL #1    Title  Pt to demo L shoulder AROm, to be WNL, to improve ability for reaching, lifting, and IADLs.     Time  6    Period  Weeks    Status  New    Target Date  05/17/18      PT LONG TERM GOAL #2   Title  Pt to demo improved strength of L shoulder, to be at least 4+/5 to improve ability for IADLs and work duties.     Time  6    Period  Weeks    Status  New    Target Date  05/17/18      PT LONG TERM GOAL #3   Title  Pt to report decreased pain in L shoulder to be 0-2/10 with activity     Time  6    Status  New    Target Date  05/17/18  Plan - 05/01/18 1205    Clinical Impression Statement  Pt with moderate pain with PROM for elevation and rotation. Pt with most pain with IR. HEP reviewed. Plan to progress as tolerated.     Rehab Potential  Good    PT Frequency  2x / week    PT Duration  6 weeks    PT Treatment/Interventions  ADLs/Self Care Home Management;Cryotherapy;Electrical Stimulation;Iontophoresis 4mg /ml Dexamethasone;Moist Heat;Therapeutic activities;Functional mobility training;Ultrasound;Therapeutic exercise;Neuromuscular re-education;Patient/family education;Dry needling;Passive range of motion;Manual techniques;Taping;Joint Manipulations    Consulted and Agree with Plan of Care  Patient       Patient will benefit from skilled therapeutic intervention in order to improve the following deficits and impairments:  Decreased endurance, Hypomobility, Decreased activity tolerance, Decreased strength, Impaired UE functional use, Pain, Increased muscle spasms, Decreased mobility, Decreased range of motion, Improper body mechanics, Impaired flexibility  Visit Diagnosis: Acute pain of left shoulder  Stiffness of left shoulder, not elsewhere classified     Problem List Patient Active Problem List   Diagnosis Date Noted  . 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 HNP (herniated nucleus pulposus), cervical 03/16/2017   . 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 ([place]) 01/06/2017  . Herniation of cervical intervertebral disc with radiculopathy 01/04/2017  . Current mild episode of major depressive disorder without prior episode ([place]) 12/02/2016  . Diabetic neuropathy ([place]) 03/01/2016  . Obesity, Class II, BMI 35.0-39.9, with comorbidity (see actual BMI) 04/24/2015  . Type 1 diabetes mellitus with hyperglycemia, with long-term current use of insulin ([place]) 04/04/2015  . Patellofemoral stress syndrome of left knee 03/19/2015  . Allergic rhinitis due to pollen 11/14/2014  . Equinus deformity of foot, acquired 05/29/2014  . ADHD (attention deficit hyperactivity disorder), inattentive type 10/24/2013  . Vitamin D deficiency 02/28/2013  . Pulmonic stenosis 05/25/2012  . Pure hyperglyceridemia 05/25/2012  . Hyperlipidemia with target LDL less than 100 09/20/2011  . OSA (obstructive sleep apnea) 09/20/2011  . BPH (benign prostatic hyperplasia) 09/20/2011  . Routine general medical examination at a health care facility 09/20/2011    Lyndee Hensen, PT, DPT 12:08 PM  05/01/18    [facility] [place], [place], [address] Phone: [phone]   Fax:  [phone]  Name: Zachary Graham MRN: [id number] Date of Birth: 06-30-1960

## 2018-05-02 ENCOUNTER — Ambulatory Visit (INDEPENDENT_AMBULATORY_CARE_PROVIDER_SITE_OTHER): Payer: 59 | Admitting: Internal Medicine

## 2018-05-02 ENCOUNTER — Encounter: Payer: Self-pay | Admitting: Internal Medicine

## 2018-05-02 ENCOUNTER — Ambulatory Visit (INDEPENDENT_AMBULATORY_CARE_PROVIDER_SITE_OTHER): Payer: 59 | Admitting: Physical Therapy

## 2018-05-02 ENCOUNTER — Encounter: Payer: Self-pay | Admitting: Physical Therapy

## 2018-05-02 VITALS — BP 140/70 | HR 81 | Ht 70.0 in | Wt 227.0 lb

## 2018-05-02 DIAGNOSIS — M25612 Stiffness of left shoulder, not elsewhere classified: Secondary | ICD-10-CM

## 2018-05-02 DIAGNOSIS — M25512 Pain in left shoulder: Secondary | ICD-10-CM | POA: Diagnosis not present

## 2018-05-02 DIAGNOSIS — E1065 Type 1 diabetes mellitus with hyperglycemia: Secondary | ICD-10-CM

## 2018-05-02 DIAGNOSIS — E785 Hyperlipidemia, unspecified: Secondary | ICD-10-CM | POA: Diagnosis not present

## 2018-05-02 LAB — POCT GLYCOSYLATED HEMOGLOBIN (HGB A1C): Hemoglobin A1C: 7.7 % — AB (ref 4.0–5.6)

## 2018-05-02 MED ORDER — GLUCOSE BLOOD VI STRP: ORAL_STRIP | 5 refills | Status: DC

## 2018-05-02 NOTE — Progress Notes (Signed)
Patient ID: Zachary Graham, male   DOB: 10/19/1960, 57 y.o.   MRN: [id number]  HPI: Zachary Graham is a 57 y.o.-year-old male, returning for f/u for DM1, dx 1993, insulin-dependent, uncontrolled, with complications (mild sensory peripheral neuropathy; hypoglycemia episodes, DR). Last visit 4 months ago.  He stopped Nutrisystem 2 mo ago due to price. Gained weight and sugars higher.  He started to increase his insulin doses, but sugars are still high now during the holidays.  He has adhesive capsulitis >> steroid inj. >> sugars increased further.   He started on insulin pump in 2006.  He is currently on Medtronic 670 G insulin pump with NovoLog in the pump.  Last hemoglobin A1c was: Lab Results  Component Value Date   HGBA1C 7.3 (A) 01/05/2018   HGBA1C 8.1 (A) 10/03/2017   HGBA1C 7.6 03/10/2017  04/05/2014: HbA1c 7.3%  Pump settings: - Basal rates: 12 AM: 2.00 >> 1.8 >> 2.20 4 AM: 1.8 >> 2.35 8 AM: 2.5 >> 2.2 >> 2.55 12 PM: 2.4 >> 2.2 >> 2.45 4 PM: 2.2 >> 1.8 >> 2.45 10:30 PM: 2.3 >> 1.8 >> 2.40 - bolus: - insulin to carb ratio:  12 AM: 1.5 >> 5 >> 2 11 AM: 2.0 >> 5 >> 2 5 PM: 1.5 >> 5 >>  2 - ISF: 10 >> 25 >> 13 - target: 100-120 - IOB: 3 hours  TDD basal: 24% >> 29% >> 38% >> 31% >> 47% TDD bolus: 76% >> 71% >> 62% >> 69% >> 53% TDD 192 +/- 32 units >> 178 units >> 78 units >> 122 units - Bolus wizard: On - Changes the pump site: every 3.3 days  Pt checks his sugars 7X a day - ave. 183 +/- 64 - am:53 (correction of a high), 69-141 >> 155-264, 318 - 8-9 am:  63-181, 244 >> 86-144, 176, 204  - noon: 118-204 >> 78-195, 227 >> 90-139 >> 53 x1, 74-269 - before dinner:79, 161, 192, 248 >> 63-177 >> 75-254, 290 - after dinner:82-209 >> 52-175 >> 138-216, 293 - bedtime (12 am): >400 (site pb), 133-197, 274 >> 149-274, 377 -Nighttime: 144-296, 354 >> 117-310 >> see above >> 89-209, 243 - see above  He had a history of severe hypoglycemia episode in 2015.  No previous episodes  of DKA:  -No CKD, last BUN/creatinine:  04/11/2018: 10/0.778, ACR 11.1 Lab Results  Component Value Date   BUN 14 10/03/2017   CREATININE 0.80 10/03/2017  On Cozaar. -+ HL; last set of lipids: Lab Results  Component Value Date   CHOL 89 10/03/2017   HDL 34.10 (L) 10/03/2017   LDLCALC 33 10/03/2017   LDLDIRECT 87.0 12/02/2016   TRIG 109.0 10/03/2017   CHOLHDL 3 10/03/2017  On Zocor. 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 [facility].  -He does have numbness and tingling in his feet.  Improved on alpha-lipoic acid and B complex.  He also has a history of GERD, HTN, OSA  Latest TSH normal: 04/11/2018: TSH 3.02 Lab Results  Component Value Date   TSH 3.01 03/01/2016   04/11/2018: vitamin D 46.7. Stopped Ergocalciferol.  ROS: Constitutional: + weight gain/no weight loss, +fatigue, no subjective hyperthermia, no subjective hypothermia Eyes: no blurry vision, no xerophthalmia ENT: no sore throat, no nodules palpated in neck, no dysphagia, no odynophagia, no hoarseness Cardiovascular: no CP/no SOB/no palpitations/no leg swelling Respiratory: no cough/no SOB/no wheezing Gastrointestinal: no N/no V/no D/no C/no acid reflux Musculoskeletal: no muscle aches/no joint  aches Skin: no rashes, no hair loss Neurological: no tremors/+ numbness - feet/+ tingling/no dizziness  I reviewed pt's medications, allergies, PMH, social hx, family hx, and changes were documented in the history of present illness. Otherwise, unchanged from my initial visit note.   Past Medical History:  Diagnosis Date  . Diabetes mellitus   . GERD (gastroesophageal reflux disease)   . Heart murmur   . Hyperlipidemia   . Hypertension   . OSA (obstructive sleep apnea)   . Pneumonia    Past Surgical History:  Procedure Laterality Date  . CERVICAL DISC ARTHROPLASTY N/A 03/16/2017   Procedure: CERVICAL FIVE- CERVICAL SIX [place] ARTHROPLASTY;  Surgeon: Jovita Gamma, MD;  Location:  [facility];  Service: Neurosurgery;  Laterality: N/A;  CERVICAL 5- CERVICAL 6 DISC ARTHROPLASTY  . COLONOSCOPY    . TONSILLECTOMY     Social History   Socioeconomic History  . Marital status: Married    Spouse name: Not on file  . Number of children: 3  . Years of education: Not on file  . Highest education level: Not on file  Occupational History  . Occupation: RADIATION SAFETY OFFICER    Employer: [employer]  Social Needs  . Financial resource strain: Not on file  . Food insecurity:    Worry: Not on file    Inability: Not on file  . Transportation needs:    Medical: Not on file    Non-medical: Not on file  Tobacco Use  . Smoking status: Never Smoker  . Smokeless tobacco: Never Used  Substance and Sexual Activity  . Alcohol use: No    Alcohol/week: 0.0 standard drinks  . Drug use: No  . Sexual activity: Yes    Partners: Female  Lifestyle  . Physical activity:    Days per week: Not on file    Minutes per session: Not on file  . Stress: Not on file  Relationships  . Social connections:    Talks on phone: Not on file    Gets together: Not on file    Attends religious service: Not on file    Active member of club or organization: Not on file    Attends meetings of clubs or organizations: Not on file    Relationship status: Not on file  . Intimate partner violence:    Fear of current or ex partner: Not on file    Emotionally abused: Not on file    Physically abused: Not on file    Forced sexual activity: Not on file  Other Topics Concern  . Not on file  Social History Narrative   Regular exercise: runs 3 miles 3x week, takes one day off per week to rest   Caffeine use: stopped all diet soda on 04/23/15 per advice from Sports MD   Current Outpatient Medications on File Prior to Visit  Medication Sig Dispense Refill  . acetaminophen (TYLENOL) 325 MG tablet Take 650 mg by mouth every 6 (six) hours as needed for mild pain.    . Alpha-Lipoic Acid 600 MG CAPS Take 1  capsule by mouth daily.     Marland Kitchen amphetamine-dextroamphetamine (ADDERALL) 10 MG tablet Take 1 tablet (10 mg total) by mouth 2 (two) times daily. 180 tablet 0  . amphetamine-dextroamphetamine (ADDERALL) 5 MG tablet Take 1 tablet (5 mg total) by mouth daily. 90 tablet 0  . Ascorbic Acid (VITAMIN C) 1000 MG tablet Take 1,000 mg by mouth daily.    Marland Kitchen aspirin EC 81 MG tablet Take 81  mg by mouth daily.    Marland Kitchen b complex vitamins tablet Take 1 tablet by mouth daily.    Marland Kitchen BAYER MICROLET LANCETS lancets USE TO TEST BLOOD SUGAR 6 TIMES A DAY 300 each 11  . Calcium-Magnesium-Vitamin D (CALCIUM 500 PO) Take 1 tablet by mouth daily.     . Continuous Blood Gluc Sensor (FREESTYLE LIBRE 14 DAY SENSOR) MISC 1 each by Does not apply route every 14 (fourteen) days. Change every 2 weeks 2 each 11  . CONTOUR NEXT TEST test strip USE AS INSTRUCTED TO CHECK SUGAR 6 TIMES DAILY 400 each 5  . glucagon (GLUCAGON EMERGENCY) 1 MG injection Inject 1 mg into the muscle once as needed. 2 each prn  . insulin lispro (HUMALOG) 100 UNIT/ML injection Inject into the skin 3 (three) times daily before meals. Insulin pump    . losartan (COZAAR) 50 MG tablet TAKE 1 TABLET BY MOUTH ONCE DAILY (Patient taking differently: TAKE 50 mg BY MOUTH ONCE DAILY) 90 tablet 1  . Melatonin 5 MG CHEW Chew 10 tablets by mouth at bedtime.     . metFORMIN (GLUCOPHAGE) 500 MG tablet Take 2 tablets (1,000 mg total) by mouth daily with supper. 180 tablet 1  . Misc Natural Products (TURMERIC CURCUMIN) CAPS Take 1 capsule by mouth daily.     . NON FORMULARY CPAP    . pantoprazole (PROTONIX) 40 MG tablet TAKE 40 mg BY MOUTH ONCE DAILY 90 tablet 1  . QNASL 80 MCG/ACT AERS PLACE 4 PUFFS INTO THE NOSE DAILY. (Patient taking differently: Place 2 puffs into the nose daily as needed (allergies). ) 8.7 g 5  . SILVADENE 1 % cream APPLY 1 APPLICATION TOPICALLY DAILY. 50 g 0  . simvastatin (ZOCOR) 40 MG tablet Take 1 tablet (40 mg total) by mouth daily. 90 tablet 3  .  Vitamin D, Ergocalciferol, (DRISDOL) 1.25 MG (50000 UT) CAPS capsule Take 1 capsule (50,000 Units total) by mouth every 7 (seven) days. 12 capsule 0   No current facility-administered medications on file prior to visit.    Allergies  Allergen Reactions  . Lisinopril Swelling and Other (See Comments)    Extreme facial swelling causing hospitalization  . Shellfish Allergy Anaphylaxis  . Zetia [Ezetimibe] Other (See Comments)    ABDOMINAL CRAMPING  . Penicillins Other (See Comments)    UNSPECIFIED REACTION > Childhood allergy    . Cymbalta [Duloxetine Hcl] Other (See Comments)    Severe feet cramps   Family History  Problem Relation Age of Onset  . Colon cancer Maternal Grandfather   . Colon cancer Paternal Grandfather   . Heart disease Father        Valve replacement; pacemaker  . Alcohol abuse Neg Hx   . Asthma Neg Hx   . Diabetes Neg Hx   . Hyperlipidemia Neg Hx   . Hypertension Neg Hx   . Kidney disease Neg Hx   . Stroke Neg Hx     PE: BP 140/70   Pulse 81   Ht 5\' 10"  (1.778 m)   Wt 227 lb (103 kg)   SpO2 97%   BMI 32.57 kg/m  Body mass index is 32.14 kg/m.  Wt Readings from Last 3 Encounters:  05/02/18 227 lb (103 kg)  04/25/18 224 lb (101.6 kg)  03/20/18 220 lb (99.8 kg)   Constitutional: overweight, in NAD Eyes: PERRLA, EOMI, no exophthalmos ENT: moist mucous membranes, no thyromegaly, no cervical lymphadenopathy Cardiovascular: RRR, No MRG Respiratory: CTA B Gastrointestinal: abdomen soft,  NT, ND, BS+ Musculoskeletal: no deformities, strength intact in all 4 Skin: moist, warm, no rashes Neurological: no tremor with outstretched hands, DTR normal in all 4  ASSESSMENT: 1. DM1, insulin-dependent, uncontrolled, without complications - DR - PN  -Insulin pump -Was on a Dexcom CGM and previously on Enlite CGM, which found less accurate >> retried this Summer 2017 >> still poor accuracy.  Currently on freestyle libre CGM.  - Barriers to good  control:  Very busy schedule >> less time to check sugars and eat but But he does a great job with bolusing during the day >> still has a lot of large boluses during the day, sometimes without documented carbs or sugars  Lack of sleep >> usually sleeps max 5h a night (!) and tries to catch up in the weekend  Reaches home late at night  - sometimes at 1 am >> eats >65% of the daily calories then  2. HL  3.  Fatigue  PLAN:  1. Patient with longstanding, uncontrolled, type 1 diabetes, with high insulin resistance, on high doses of insulin through his insulin pump.  He is on the Medtronic 670 G insulin pump and has a freestyle libre CGM.  He feels that the guardian CGM was not accurate for him giving him a lot of errors.  He also continues with metformin which we are using due to his high insulin resistance.  He tolerates this well.  At last visit, HbA1c was better, at 7.3%, decreased from 8.1%. -At that time, he was having significantly better sugars and he was able to reduce the amount of insulin he was using.  At that time, he lost 25 pounds from the previous visit.  We decreased his basal rate and increase his insulin to carb ratios and his sensitivity to reflect the improvement in his insulin resistance. -At this visit, however, he gained weight compared to last visit (8 pounds) after he stopped his meal replacement diet 2 months ago due to price.  His sugars increased so he had to increase the insulin that he is getting from the pump. -We reviewed his pump downloads together.  He is not using the CGM integrated with the Medtronic pump, but he uses a freestyle libre CGM.  He does not have the receiver, uses his phone. -His sugars are high throughout the night as he is eating throughout the night and bolusing frequently at that time.  Occasionally, he will stack the insulin and drop his sugars precipitously.  We will increase his basal rates through the night -He is using a very strict insulin to  carb ratio with meals, however, he is seldom introducing a significant amount of carbs in his pump.  We discussed about the need to introduce all the carbs that he eats, and, if he eats a protein rich meal, he may need to introduce half of the protein quantity is carbs.  Also, I would be paramount to start injecting insulin 15 minutes before the meals, which she is not doing now consistently I explained that his sugars will greatly fluctuate if he is not bolusing enough in advance.  We discussed about FiAsp and I explained that this is an insulin analog to NovoLog that does not need to be injected before the meal but at the start of the meal.  However, he has a lot of NovoLog at home and for now would like to use his stock. -Also, he is telling me that his meals are now erratic and his sugars  were much better control when he was having more structured meals and was eating at the predictable time of the day.  We discussed that after the first of the year, he would absolutely need to go back to this regimen. - I advised him to:  Patient Instructions  Please change: - Basal rates: 12 AM: 2.20 >> 2.40 (can increase to 2.50 if still needed afterwards) 4 AM: 2.35 >> 2.40  (can increase to 2.50 if still needed afterwards) 8 AM: 2.55 12 PM: 2.45 4 PM: 2.45 10:30 PM: 2.40 - bolus: - Insulin to carb ratio:  12 AM: 2 11 AM: 2 5 PM: 2 - Insulin sensitivity factor: 13 - target: 100-120 - Active insulin time: 3 hours  Try to enter carbs whenever you eat. Make sure you introduce enough carbs in the pump.  Start the boluses 15 min before each meal.  You will need regimented meals again.  Please come back for a follow-up appointment in 3-4 months  - today, HbA1c is 7.7% (slightly higher) - continue checking sugars at different times of the day - check 4x a day, rotating checks - advised for yearly eye exams >> he is UTD - Return to clinic in 3-4 mo with sugar log     2. HL - Reviewed latest lipid  panel from 09/2017: LDL at goal, HDL low Lab Results  Component Value Date   CHOL 89 10/03/2017   HDL 34.10 (L) 10/03/2017   LDLCALC 33 10/03/2017   LDLDIRECT 87.0 12/02/2016   TRIG 109.0 10/03/2017   CHOLHDL 3 10/03/2017  - Continues the statin without side effects.  3.  Fatigue -He continues on B12 -Recent TSH reviewed and this was normal -Recent vitamin D level was normal -Recent hemoglobin was at the lower limits of normal.  His hematocrit was low.  He has a colonoscopy coming up next spring. -He tells me he is not sleeping through the night due to his adhesive capsulitis and this can definitely contribute to his fatigue  - time spent with the patient: 40 min, of which >50% was spent in reviewing his pump downloads, discussing his hypo- and hyper-glycemic episodes, reviewing previous labs and pump settings and developing a plan to avoid hypo- and hyper-glycemia.  We also addressed his other problems (see above).  Philemon Kingdom, MD PhD [facility] Endocrinology

## 2018-05-02 NOTE — Patient Instructions (Addendum)
Please change: - Basal rates: 12 AM: 2.20 >> 2.40 4 AM: 2.35 >> 2.40 8 AM: 2.55 12 PM: 2.45 4 PM: 2.45 10:30 PM: 2.40 - bolus: - Insulin to carb ratio:  12 AM: 2 11 AM: 2 5 PM: 2 - Insulin sensitivity factor: 13 - target: 100-120 - Active insulin time: 3 hours  Try to enter carbs whenever you eat. Make sure you introduce enough carbs in the pump.  Start the boluses 15 min before each meal.  You will need regimented meals again.  Please come back for a follow-up appointment in 3-4 months

## 2018-05-02 NOTE — Therapy (Signed)
[facility] [address] [place], [place], [address] Phone: [phone]   Fax:  [phone]  Physical Therapy Treatment  Patient Details  Name: Zachary Graham MRN: [id number] Date of Birth: 1961-01-24 Referring Provider (PT): [name]   Encounter Date: 05/02/2018  PT End of Session - 05/02/18 0843    Visit Number  5    Number of Visits  12    Date for PT Re-Evaluation  05/17/18    Authorization Type  [facility]    PT Start Time  0802    PT Stop Time  0845    PT Time Calculation (min)  43 min    Activity Tolerance  Patient tolerated treatment well    Behavior During Therapy  [facility] for tasks assessed/performed       Past Medical History:  Diagnosis Date  . Diabetes mellitus   . GERD (gastroesophageal reflux disease)   . Heart murmur   . Hyperlipidemia   . Hypertension   . OSA (obstructive sleep apnea)   . Pneumonia     Past Surgical History:  Procedure Laterality Date  . CERVICAL DISC ARTHROPLASTY N/A 03/16/2017   Procedure: CERVICAL FIVE- CERVICAL SIX [place] ARTHROPLASTY;  Surgeon: Jovita Gamma, MD;  Location: [facility];  Service: Neurosurgery;  Laterality: N/A;  CERVICAL 5- CERVICAL 6 DISC ARTHROPLASTY  . COLONOSCOPY    . TONSILLECTOMY      There were no vitals filed for this visit.  Subjective Assessment - 05/02/18 0812    Subjective  Pt states he feels shoulder is improving. Sleeping is still painful.     Currently in Pain?  Yes    Pain Score  2     Pain Location  Shoulder    Pain Orientation  Left    Pain Descriptors / Indicators  Aching    Pain Type  Acute pain    Pain Onset  More than a month ago    Pain Frequency  Intermittent                       [facility] Adult PT Treatment/Exercise - 05/02/18 1121      Exercises   Exercises  Shoulder      Shoulder Exercises: Supine   External Rotation  20 reps    External Rotation Weight (lbs)  --    External Rotation Limitations  cane    Flexion  20  reps;AROM    Shoulder Flexion Weight (lbs)  2 lb      Shoulder Exercises: Standing   External Rotation  --    Theraband Level (Shoulder External Rotation)  --    Internal Rotation  --    Theraband Level (Shoulder Internal Rotation)  --    Row  --    Theraband Level (Shoulder Row)  --    Other Standing Exercises  Wall slides x10; Wall circles x10 ;     Other Standing Exercises  AROM:flexion and abd each x15;       Shoulder Exercises: Pulleys   Flexion  2 minutes    ABduction  1 minute      Shoulder Exercises: Stretch   Corner Stretch  3 reps;30 seconds    Internal Rotation Stretch  5 reps    Internal Rotation Stretch Limitations  behind back with strap      Manual Therapy   Manual Therapy  Joint mobilization;Passive ROM    Joint Mobilization  L GHJ mobs, all motions, gr 3 ;  Passive ROM  L shoulder, all motions; Contract relax for IR/ER;                 PT Short Term Goals - 04/09/18 2138      PT SHORT TERM GOAL #1   Title  Pt to be independent with initial HEP     Time  2    Period  Weeks    Status  New    Target Date  04/19/18        PT Long Term Goals - 04/09/18 2138      PT LONG TERM GOAL #1   Title  Pt to demo L shoulder AROm, to be WNL, to improve ability for reaching, lifting, and IADLs.     Time  6    Period  Weeks    Status  New    Target Date  05/17/18      PT LONG TERM GOAL #2   Title  Pt to demo improved strength of L shoulder, to be at least 4+/5 to improve ability for IADLs and work duties.     Time  6    Period  Weeks    Status  New    Target Date  05/17/18      PT LONG TERM GOAL #3   Title  Pt to report decreased pain in L shoulder to be 0-2/10 with activity     Time  6    Status  New    Target Date  05/17/18            Plan - 05/02/18 1119    Clinical Impression Statement  Pt with significant guarding with attempts for PROM ER today, also with signficant soreness with this motion today. Mild improvements seen with IR  behing the back today, with less pain and improved motion. Pt improving with ability for AROM for elevation, continues to have limitation and difficulty with rotation. Plan to progress as tolerated.     Rehab Potential  Good    PT Frequency  2x / week    PT Duration  6 weeks    PT Treatment/Interventions  ADLs/Self Care Home Management;Cryotherapy;Electrical Stimulation;Iontophoresis 4mg /ml Dexamethasone;Moist Heat;Therapeutic activities;Functional mobility training;Ultrasound;Therapeutic exercise;Neuromuscular re-education;Patient/family education;Dry needling;Passive range of motion;Manual techniques;Taping;Joint Manipulations    Consulted and Agree with Plan of Care  Patient       Patient will benefit from skilled therapeutic intervention in order to improve the following deficits and impairments:  Decreased endurance, Hypomobility, Decreased activity tolerance, Decreased strength, Impaired UE functional use, Pain, Increased muscle spasms, Decreased mobility, Decreased range of motion, Improper body mechanics, Impaired flexibility  Visit Diagnosis: Acute pain of left shoulder  Stiffness of left shoulder, not elsewhere classified     Problem List Patient Active Problem List   Diagnosis Date Noted  . 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 HNP (herniated nucleus pulposus), cervical 03/16/2017  . 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 Herniation of cervical intervertebral disc with radiculopathy 01/04/2017  . Current mild episode of major depressive disorder without prior episode ([place]) 12/02/2016  . Diabetic neuropathy ([place]) 03/01/2016  . Obesity, Class II, BMI 35.0-39.9, with comorbidity (see actual BMI) 04/24/2015  . Type 1 diabetes mellitus with hyperglycemia, with long-term current use of insulin ([place]) 04/04/2015  . Patellofemoral stress syndrome of left knee 03/19/2015  .  Allergic rhinitis due to pollen 11/14/2014  . Equinus deformity of foot, acquired  05/29/2014  . ADHD (attention deficit hyperactivity disorder), inattentive type 10/24/2013  . Vitamin D deficiency 02/28/2013  . Pulmonic stenosis 05/25/2012  . Pure hyperglyceridemia 05/25/2012  . Hyperlipidemia with target LDL less than 100 09/20/2011  . OSA (obstructive sleep apnea) 09/20/2011  . BPH (benign prostatic hyperplasia) 09/20/2011  . Routine general medical examination at a health care facility 09/20/2011     Lyndee Hensen, PT, DPT 11:25 AM  05/02/18    [facility] [place], [place], [address] Phone: [phone]   Fax:  [phone]  Name: VELDON WAGER MRN: [id number] Date of Birth: [birth year]

## 2018-05-04 ENCOUNTER — Encounter: Payer: Self-pay | Admitting: Physical Therapy

## 2018-05-04 ENCOUNTER — Ambulatory Visit (INDEPENDENT_AMBULATORY_CARE_PROVIDER_SITE_OTHER): Payer: 59 | Admitting: Physical Therapy

## 2018-05-04 DIAGNOSIS — M25512 Pain in left shoulder: Secondary | ICD-10-CM | POA: Diagnosis not present

## 2018-05-04 DIAGNOSIS — M25612 Stiffness of left shoulder, not elsewhere classified: Secondary | ICD-10-CM | POA: Diagnosis not present

## 2018-05-04 NOTE — Therapy (Signed)
[facility] [address] [place], [place], [address] Phone: [phone]   Fax:  [phone]  Physical Therapy Treatment  Patient Details  Name: Zachary Graham MRN: [id number] Date of Birth: Feb 20, 1961 Referring Provider (PT): [name]   Encounter Date: 05/04/2018  PT End of Session - 05/04/18 1635    Visit Number  6    Number of Visits  12    Date for PT Re-Evaluation  05/17/18    Authorization Type  [facility]    PT Start Time  1604    PT Stop Time  1655    PT Time Calculation (min)  51 min    Activity Tolerance  Patient tolerated treatment well    Behavior During Therapy  [facility] for tasks assessed/performed       Past Medical History:  Diagnosis Date  . Diabetes mellitus   . GERD (gastroesophageal reflux disease)   . Heart murmur   . Hyperlipidemia   . Hypertension   . OSA (obstructive sleep apnea)   . Pneumonia     Past Surgical History:  Procedure Laterality Date  . CERVICAL DISC ARTHROPLASTY N/A 03/16/2017   Procedure: CERVICAL FIVE- CERVICAL SIX [place] ARTHROPLASTY;  Surgeon: Jovita Gamma, MD;  Location: [facility];  Service: Neurosurgery;  Laterality: N/A;  CERVICAL 5- CERVICAL 6 DISC ARTHROPLASTY  . COLONOSCOPY    . TONSILLECTOMY      There were no vitals filed for this visit.  Subjective Assessment - 05/04/18 1633    Subjective  Pt states soreness in shoulder today. Still feels very stiff.    Patient Stated Goals  decreased pain , improved movement    Currently in Pain?  Yes    Pain Location  Shoulder    Pain Orientation  Left    Pain Descriptors / Indicators  Aching    Pain Type  Acute pain    Pain Onset  More than a month ago    Pain Frequency  Intermittent                       [facility] Adult PT Treatment/Exercise - 05/04/18 1629      Exercises   Exercises  Shoulder      Shoulder Exercises: Supine   External Rotation  20 reps    External Rotation Limitations  2lb AROM    Flexion  20  reps;AROM    Shoulder Flexion Weight (lbs)  2 lb      Shoulder Exercises: Standing   External Rotation  20 reps;Theraband    Theraband Level (Shoulder External Rotation)  Level 2 (Red)    Internal Rotation  20 reps;Theraband    Theraband Level (Shoulder Internal Rotation)  Level 3 (Green)    Row  20 reps    Theraband Level (Shoulder Row)  Level 3 (Green)    Other Standing Exercises  --    Other Standing Exercises  --      Shoulder Exercises: Pulleys   Flexion  2 minutes    ABduction  --    Other Pulley Exercises  ER x1 min      Shoulder Exercises: Stretch   Corner Stretch  --    Internal Rotation Stretch  5 reps    Internal Rotation Stretch Limitations  behind back with strap      Modalities   Modalities  Moist Heat      Moist Heat Therapy   Number Minutes Moist Heat  10 Minutes  Moist Heat Location  Cervical      Manual Therapy   Manual Therapy  Joint mobilization;Passive ROM    Joint Mobilization  L GHJ mobs, all motions, gr 3 ;     Passive ROM  L shoulder, all motions;                PT Short Term Goals - 04/09/18 2138      PT SHORT TERM GOAL #1   Title  Pt to be independent with initial HEP     Time  2    Period  Weeks    Status  New    Target Date  04/19/18        PT Long Term Goals - 04/09/18 2138      PT LONG TERM GOAL #1   Title  Pt to demo L shoulder AROm, to be WNL, to improve ability for reaching, lifting, and IADLs.     Time  6    Period  Weeks    Status  New    Target Date  05/17/18      PT LONG TERM GOAL #2   Title  Pt to demo improved strength of L shoulder, to be at least 4+/5 to improve ability for IADLs and work duties.     Time  6    Period  Weeks    Status  New    Target Date  05/17/18      PT LONG TERM GOAL #3   Title  Pt to report decreased pain in L shoulder to be 0-2/10 with activity     Time  6    Status  New    Target Date  05/17/18            Plan - 05/04/18 1647    Clinical Impression Statement  Pt  with increased general soreness today. Pt with soreness and tightness in anterior shoulder with STM today. Pt continues to have much stiffness and pain with ER. Pt given heat at end of session for soreness and tightness. HEP reviewed. Plan to progress as pt tolerates.     Rehab Potential  Good    PT Frequency  2x / week    PT Duration  6 weeks    PT Treatment/Interventions  ADLs/Self Care Home Management;Cryotherapy;Electrical Stimulation;Iontophoresis 4mg /ml Dexamethasone;Moist Heat;Therapeutic activities;Functional mobility training;Ultrasound;Therapeutic exercise;Neuromuscular re-education;Patient/family education;Dry needling;Passive range of motion;Manual techniques;Taping;Joint Manipulations    Consulted and Agree with Plan of Care  Patient       Patient will benefit from skilled therapeutic intervention in order to improve the following deficits and impairments:  Decreased endurance, Hypomobility, Decreased activity tolerance, Decreased strength, Impaired UE functional use, Pain, Increased muscle spasms, Decreased mobility, Decreased range of motion, Improper body mechanics, Impaired flexibility  Visit Diagnosis: Acute pain of left shoulder  Stiffness of left shoulder, not elsewhere classified     Problem List Patient Active Problem List   Diagnosis Date Noted  . 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) 03/20/2018  . HNP (herniated nucleus pulposus), cervical 03/16/2017  . 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 ([place]) 01/06/2017  . Herniation of cervical intervertebral disc with radiculopathy 01/04/2017  . Current mild episode of major depressive disorder without prior episode ([place]) 12/02/2016  . Diabetic neuropathy ([place]) 03/01/2016  . Obesity, Class II, BMI 35.0-39.9, with comorbidity (see actual BMI) 04/24/2015  . Type 1 diabetes mellitus with hyperglycemia, with long-term current use of insulin ([facility])  04/04/2015  . Patellofemoral stress syndrome of left knee 03/19/2015  . Allergic rhinitis due to pollen 11/14/2014  . Equinus deformity of foot, acquired 05/29/2014  . ADHD (attention deficit hyperactivity disorder), inattentive type 10/24/2013  . Vitamin D deficiency 02/28/2013  . Pulmonic stenosis 05/25/2012  . Pure hyperglyceridemia 05/25/2012  . Hyperlipidemia with target LDL less than 100 09/20/2011  . OSA (obstructive sleep apnea) 09/20/2011  . BPH (benign prostatic hyperplasia) 09/20/2011  . Routine general medical examination at a health care facility 09/20/2011    Lyndee Hensen, PT, DPT 4:52 PM  05/04/18    [facility] [place], [place], [address] Phone: [phone]   Fax:  [phone]  Name: VANSH RECKART MRN: [id number] Date of Birth: 03-Apr-1961

## 2018-05-09 ENCOUNTER — Ambulatory Visit (INDEPENDENT_AMBULATORY_CARE_PROVIDER_SITE_OTHER): Payer: 59 | Admitting: Physical Therapy

## 2018-05-09 ENCOUNTER — Encounter: Payer: Self-pay | Admitting: Physical Therapy

## 2018-05-09 DIAGNOSIS — M25612 Stiffness of left shoulder, not elsewhere classified: Secondary | ICD-10-CM | POA: Diagnosis not present

## 2018-05-09 DIAGNOSIS — M25512 Pain in left shoulder: Secondary | ICD-10-CM

## 2018-05-09 NOTE — Therapy (Signed)
[facility] [address] [place], [place], [address] Phone: [phone]   Fax:  [phone]  Physical Therapy Treatment  Patient Details  Name: Zachary Graham MRN: [id number] Date of Birth: [birth year] Referring Provider (PT): [name]   Encounter Date: 05/09/2018  PT End of Session - 05/09/18 1003    Visit Number  7    Number of Visits  12    Date for PT Re-Evaluation  05/17/18    Authorization Type  [facility]    PT Start Time  0850    PT Stop Time  0930    PT Time Calculation (min)  40 min    Activity Tolerance  Patient tolerated treatment well    Behavior During Therapy  [facility] for tasks assessed/performed       Past Medical History:  Diagnosis Date  . Diabetes mellitus   . GERD (gastroesophageal reflux disease)   . Heart murmur   . Hyperlipidemia   . Hypertension   . OSA (obstructive sleep apnea)   . Pneumonia     Past Surgical History:  Procedure Laterality Date  . CERVICAL DISC ARTHROPLASTY N/A 03/16/2017   Procedure: CERVICAL FIVE- CERVICAL SIX [place] ARTHROPLASTY;  Surgeon: Jovita Gamma, MD;  Location: [facility];  Service: Neurosurgery;  Laterality: N/A;  CERVICAL 5- CERVICAL 6 DISC ARTHROPLASTY  . COLONOSCOPY    . TONSILLECTOMY      There were no vitals filed for this visit.  Subjective Assessment - 05/09/18 1002    Subjective  Pt states continued soreness, and stiffness. He states difficulty with reaching arm out to the side or forward to lift .     Patient Stated Goals  decreased pain , improved movement    Currently in Pain?  Yes    Pain Score  3     Pain Location  Shoulder    Pain Orientation  Left    Pain Descriptors / Indicators  Aching    Pain Type  Acute pain    Pain Onset  More than a month ago    Pain Frequency  Intermittent         [facility] PT Assessment - 05/09/18 0001      PROM   Left Shoulder Flexion  140 Degrees    Left Shoulder ABduction  150 Degrees    Left Shoulder Internal Rotation  50  Degrees    Left Shoulder External Rotation  60 Degrees      Strength   Overall Strength Comments  taken within available range/ mid range    Left Shoulder Flexion  4/5    Left Shoulder ABduction  4/5    Left Shoulder Internal Rotation  4/5    Left Shoulder External Rotation  4/5                   [facility] Adult PT Treatment/Exercise - 05/09/18 0857      Exercises   Exercises  Shoulder      Shoulder Exercises: Supine   External Rotation  --    External Rotation Limitations  --    Flexion  20 reps;AROM    Shoulder Flexion Weight (lbs)  2 lb      Shoulder Exercises: Seated   External Rotation  20 reps;AAROM    External Rotation Limitations  cane      Shoulder Exercises: Standing   External Rotation  --    Theraband Level (Shoulder External Rotation)  --    Internal Rotation  --  Theraband Level (Shoulder Internal Rotation)  --    Row  20 reps    Theraband Level (Shoulder Row)  Level 3 (Green)    Other Standing Exercises  Wall slides x10; Wall circles x10 ;     Other Standing Exercises  AROM:flexion and abd each x15, full motion;       Shoulder Exercises: Pulleys   Flexion  2 minutes    Other Pulley Exercises  --      Shoulder Exercises: Stretch   Corner Stretch  3 reps;30 seconds    Corner Stretch Limitations  for ER    Internal Rotation Stretch  5 reps    Internal Rotation Stretch Limitations  behind back with strap      Modalities   Modalities  Moist Heat      Moist Heat Therapy   Moist Heat Location  --      Manual Therapy   Manual Therapy  Joint mobilization;Passive ROM    Joint Mobilization  L GHJ mobs, all motions, gr 3 ;     Passive ROM  L shoulder, all motions; Contract relax for IR/ER             PT Education - 05/09/18 1003    Education Details  HEP reviewed    Person(s) Educated  Patient    Methods  Explanation    Comprehension  Verbalized understanding       PT Short Term Goals - 05/09/18 1004      PT SHORT TERM GOAL #1    Title  Pt to be independent with initial HEP     Time  2    Period  Weeks    Status  Achieved        PT Long Term Goals - 04/09/18 2138      PT LONG TERM GOAL #1   Title  Pt to demo L shoulder AROm, to be WNL, to improve ability for reaching, lifting, and IADLs.     Time  6    Period  Weeks    Status  New    Target Date  05/17/18      PT LONG TERM GOAL #2   Title  Pt to demo improved strength of L shoulder, to be at least 4+/5 to improve ability for IADLs and work duties.     Time  6    Period  Weeks    Status  New    Target Date  05/17/18      PT LONG TERM GOAL #3   Title  Pt to report decreased pain in L shoulder to be 0-2/10 with activity     Time  6    Status  New    Target Date  05/17/18            Plan - 05/09/18 1004    Clinical Impression Statement  Measurements updated today. Pt with mild improvements of IR/ER motions. ER continues to be most painful. He has improving strength in all motions. PROM still limited due to joint stiffness. Pt diligent with HEP, and tolerant of PROM., but ROM gains have been slow. Plan to continue to focus on improving ROM.     Rehab Potential  Good    PT Frequency  2x / week    PT Duration  6 weeks    PT Treatment/Interventions  ADLs/Self Care Home Management;Cryotherapy;Electrical Stimulation;Iontophoresis 4mg /ml Dexamethasone;Moist Heat;Therapeutic activities;Functional mobility training;Ultrasound;Therapeutic exercise;Neuromuscular re-education;Patient/family education;Dry needling;Passive range of motion;Manual techniques;Taping;Joint Manipulations  Consulted and Agree with Plan of Care  Patient       Patient will benefit from skilled therapeutic intervention in order to improve the following deficits and impairments:  Decreased endurance, Hypomobility, Decreased activity tolerance, Decreased strength, Impaired UE functional use, Pain, Increased muscle spasms, Decreased mobility, Decreased range of motion, Improper body  mechanics, Impaired flexibility  Visit Diagnosis: Acute pain of left shoulder  Stiffness of left shoulder, not elsewhere classified     Problem List Patient Active Problem List   Diagnosis Date Noted  . 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 HNP (herniated nucleus pulposus), cervical 03/16/2017  . 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 ([place]) 01/06/2017  . Herniation of cervical intervertebral disc with radiculopathy 01/04/2017  . Current mild episode of major depressive disorder without prior episode ([place]) 12/02/2016  . Diabetic neuropathy ([place]) 03/01/2016  . Obesity, Class II, BMI 35.0-39.9, with comorbidity (see actual BMI) 04/24/2015  . Type 1 diabetes mellitus with hyperglycemia, with long-term current use of insulin ([place]) 04/04/2015  . Patellofemoral stress syndrome of left knee 03/19/2015  . Allergic rhinitis due to pollen 11/14/2014  . Equinus deformity of foot, acquired 05/29/2014  . ADHD (attention deficit hyperactivity disorder), inattentive type 10/24/2013  . Vitamin D deficiency 02/28/2013  . Pulmonic stenosis 05/25/2012  . Pure hyperglyceridemia 05/25/2012  . Hyperlipidemia with target LDL less than 100 09/20/2011  . OSA (obstructive sleep apnea) 09/20/2011  . BPH (benign prostatic hyperplasia) 09/20/2011  . Routine general medical examination at a health care facility 09/20/2011    Lyndee Hensen, PT, DPT 10:06 AM  05/09/18    [facility] [place], [place], [address] Phone: [phone]   Fax:  [phone]  Name: Zachary Graham MRN: [id number] Date of Birth: Dec 23, 1960

## 2018-05-12 ENCOUNTER — Ambulatory Visit (INDEPENDENT_AMBULATORY_CARE_PROVIDER_SITE_OTHER): Payer: 59 | Admitting: Physical Therapy

## 2018-05-12 ENCOUNTER — Encounter: Payer: Self-pay | Admitting: Physical Therapy

## 2018-05-12 DIAGNOSIS — M25612 Stiffness of left shoulder, not elsewhere classified: Secondary | ICD-10-CM | POA: Diagnosis not present

## 2018-05-12 DIAGNOSIS — M25512 Pain in left shoulder: Secondary | ICD-10-CM

## 2018-05-12 MED FILL — AMPHETAMINE-DEXTROAMPHETAMI: 10 | 90 days supply | Qty: 180 | Fill #0

## 2018-05-15 ENCOUNTER — Telehealth: Payer: Self-pay

## 2018-05-15 NOTE — Telephone Encounter (Signed)
PA submitted for Libre 14 day sensor.

## 2018-05-16 ENCOUNTER — Encounter: Payer: Self-pay | Admitting: Family Medicine

## 2018-05-16 ENCOUNTER — Encounter: Payer: Self-pay | Admitting: Physical Therapy

## 2018-05-16 ENCOUNTER — Ambulatory Visit (INDEPENDENT_AMBULATORY_CARE_PROVIDER_SITE_OTHER): Payer: 59 | Admitting: Physical Therapy

## 2018-05-16 DIAGNOSIS — M25612 Stiffness of left shoulder, not elsewhere classified: Secondary | ICD-10-CM | POA: Diagnosis not present

## 2018-05-16 DIAGNOSIS — M25512 Pain in left shoulder: Secondary | ICD-10-CM | POA: Diagnosis not present

## 2018-05-16 NOTE — Therapy (Signed)
[facility] [address] [place], [place], [address] Phone: [phone]   Fax:  [phone]  Physical Therapy Treatment  Patient Details  Name: Zachary Graham MRN: [id number] Date of Birth: 1960-09-22 Referring Provider (PT): [name]   Encounter Date: 05/12/2018  PT End of Session - 05/16/18 0754    Visit Number  8    Number of Visits  12    Date for PT Re-Evaluation  05/17/18    Authorization Type  [facility]    PT Start Time  1350    PT Stop Time  1440    PT Time Calculation (min)  50 min    Activity Tolerance  Patient tolerated treatment well    Behavior During Therapy  [facility] for tasks assessed/performed       Past Medical History:  Diagnosis Date  . Diabetes mellitus   . GERD (gastroesophageal reflux disease)   . Heart murmur   . Hyperlipidemia   . Hypertension   . OSA (obstructive sleep apnea)   . Pneumonia     Past Surgical History:  Procedure Laterality Date  . CERVICAL DISC ARTHROPLASTY N/A 03/16/2017   Procedure: CERVICAL FIVE- CERVICAL SIX [place] ARTHROPLASTY;  Surgeon: Jovita Gamma, MD;  Location: [facility];  Service: Neurosurgery;  Laterality: N/A;  CERVICAL 5- CERVICAL 6 DISC ARTHROPLASTY  . COLONOSCOPY    . TONSILLECTOMY      There were no vitals filed for this visit.  Subjective Assessment - 05/16/18 0753    Subjective  Pt states continued soreness in L shoulder, bothersome for sleeping.     Patient Stated Goals  decreased pain , improved movement    Currently in Pain?  Yes    Pain Score  3     Pain Location  Shoulder    Pain Orientation  Left    Pain Descriptors / Indicators  Aching    Pain Onset  More than a month ago    Pain Frequency  Intermittent                       [facility] Adult PT Treatment/Exercise - 05/16/18 0001      Exercises   Exercises  Shoulder      Shoulder Exercises: Supine   Flexion  20 reps;AROM    Shoulder Flexion Weight (lbs)  2 lb      Shoulder Exercises:  Seated   External Rotation  --    External Rotation Limitations  --      Shoulder Exercises: Standing   Row  20 reps    Theraband Level (Shoulder Row)  Level 3 (Green)    Other Standing Exercises  AROM:flexion and abd each x15, full motion;       Shoulder Exercises: Pulleys   Flexion  2 minutes      Shoulder Exercises: Stretch   Corner Stretch  3 reps;30 seconds    Corner Stretch Limitations  for ER    Internal Rotation Stretch  5 reps    Internal Rotation Stretch Limitations  behind back with strap      Modalities   Modalities  Moist Heat;Electrical Stimulation      Moist Heat Therapy   Number Minutes Moist Heat  10 Minutes    Moist Heat Location  Shoulder      Electrical Stimulation   Electrical Stimulation Location  L shoulder    Electrical Stimulation Action  Pre-Mod    Electrical Stimulation Goals  Pain  Manual Therapy   Manual Therapy  Joint mobilization;Passive ROM    Joint Mobilization  L GHJ mobs, all motions, gr 3 ;     Passive ROM  L shoulder, all motions; Contract relax for IR/ER               PT Short Term Goals - 05/09/18 1004      PT SHORT TERM GOAL #1   Title  Pt to be independent with initial HEP     Time  2    Period  Weeks    Status  Achieved        PT Long Term Goals - 04/09/18 2138      PT LONG TERM GOAL #1   Title  Pt to demo L shoulder AROm, to be WNL, to improve ability for reaching, lifting, and IADLs.     Time  6    Period  Weeks    Status  New    Target Date  05/17/18      PT LONG TERM GOAL #2   Title  Pt to demo improved strength of L shoulder, to be at least 4+/5 to improve ability for IADLs and work duties.     Time  6    Period  Weeks    Status  New    Target Date  05/17/18      PT LONG TERM GOAL #3   Title  Pt to report decreased pain in L shoulder to be 0-2/10 with activity     Time  6    Status  New    Target Date  05/17/18            Plan - 05/16/18 0755    Clinical Impression Statement  Pt  continues to be limited by pain, and pain with PROM, especially for ER. He has joint stiffness that is improving mildly, but is limited at end range for all motions. E-Stim done for pain today. Plan to progress as pt able.     Rehab Potential  Good    PT Frequency  2x / week    PT Duration  6 weeks    PT Treatment/Interventions  ADLs/Self Care Home Management;Cryotherapy;Electrical Stimulation;Iontophoresis 4mg /ml Dexamethasone;Moist Heat;Therapeutic activities;Functional mobility training;Ultrasound;Therapeutic exercise;Neuromuscular re-education;Patient/family education;Dry needling;Passive range of motion;Manual techniques;Taping;Joint Manipulations    Consulted and Agree with Plan of Care  Patient       Patient will benefit from skilled therapeutic intervention in order to improve the following deficits and impairments:  Decreased endurance, Hypomobility, Decreased activity tolerance, Decreased strength, Impaired UE functional use, Pain, Increased muscle spasms, Decreased mobility, Decreased range of motion, Improper body mechanics, Impaired flexibility  Visit Diagnosis: Acute pain of left shoulder  Stiffness of left shoulder, not elsewhere classified     Problem List Patient Active Problem List   Diagnosis Date Noted  . 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 HNP (herniated nucleus pulposus), cervical 03/16/2017  . 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 ([place]) 01/06/2017  . Herniation of cervical intervertebral disc with radiculopathy 01/04/2017  . Current mild episode of major depressive disorder without prior episode ([place]) 12/02/2016  . Diabetic neuropathy ([place]) 03/01/2016  . Obesity, Class II, BMI 35.0-39.9, with comorbidity (see actual BMI) 04/24/2015  . Type 1 diabetes mellitus with hyperglycemia, with long-term current use of insulin ([place]) 04/04/2015  . Patellofemoral stress syndrome of  left knee 03/19/2015  . Allergic rhinitis due to pollen 11/14/2014  .  Equinus deformity of foot, acquired 05/29/2014  . ADHD (attention deficit hyperactivity disorder), inattentive type 10/24/2013  . Vitamin D deficiency 02/28/2013  . Pulmonic stenosis 05/25/2012  . Pure hyperglyceridemia 05/25/2012  . Hyperlipidemia with target LDL less than 100 09/20/2011  . OSA (obstructive sleep apnea) 09/20/2011  . BPH (benign prostatic hyperplasia) 09/20/2011  . Routine general medical examination at a health care facility 09/20/2011    Lyndee Hensen, PT, DPT 7:58 AM  05/16/18    [facility] [place], [place], [address] Phone: [phone]   Fax:  [phone]  Name: Zachary Graham MRN: [id number] Date of Birth: 1960/10/25

## 2018-05-22 ENCOUNTER — Encounter: Payer: Self-pay | Admitting: Physical Therapy

## 2018-05-22 NOTE — Therapy (Signed)
[facility] [address] [place], [place], [address] Phone: [phone]   Fax:  [phone]  Physical Therapy Treatment  Patient Details  Name: Zachary Graham MRN: [id number] Date of Birth: 1960/08/10 Referring Provider (PT): [name]   Encounter Date: 05/16/2018  PT End of Session - 05/22/18 0825    Visit Number  9    Number of Visits  12    Date for PT Re-Evaluation  05/17/18    Authorization Type  [facility]    PT Start Time  0848    PT Stop Time  0940    PT Time Calculation (min)  52 min    Activity Tolerance  Patient tolerated treatment well    Behavior During Therapy  [facility] for tasks assessed/performed       Past Medical History:  Diagnosis Date  . Diabetes mellitus   . GERD (gastroesophageal reflux disease)   . Heart murmur   . Hyperlipidemia   . Hypertension   . OSA (obstructive sleep apnea)   . Pneumonia     Past Surgical History:  Procedure Laterality Date  . CERVICAL DISC ARTHROPLASTY N/A 03/16/2017   Procedure: CERVICAL FIVE- CERVICAL SIX [place] ARTHROPLASTY;  Surgeon: Jovita Gamma, MD;  Location: [facility];  Service: Neurosurgery;  Laterality: N/A;  CERVICAL 5- CERVICAL 6 DISC ARTHROPLASTY  . COLONOSCOPY    . TONSILLECTOMY      There were no vitals filed for this visit.  Subjective Assessment - 05/22/18 0825    Subjective  Pt states that his shoulder stays sore most of the time, he is having continued soreness with sleeping, that is waking him at night, unless he is taking a sleeping pill.     Currently in Pain?  Yes    Pain Score  3     Pain Location  Shoulder    Pain Orientation  Left    Pain Descriptors / Indicators  Aching    Pain Type  Acute pain    Pain Onset  More than a month ago    Pain Frequency  Intermittent                       [facility] Adult PT Treatment/Exercise - 05/22/18 0001      Exercises   Exercises  Shoulder      Shoulder Exercises: Supine   Flexion  20 reps;AROM     Shoulder Flexion Weight (lbs)  2 lb    Other Supine Exercises  AAROM/cane/flexion x15'      Shoulder Exercises: Sidelying   External Rotation  15 reps      Shoulder Exercises: Standing   External Rotation  20 reps    Theraband Level (Shoulder External Rotation)  Level 2 (Red)    Internal Rotation  20 reps    Theraband Level (Shoulder Internal Rotation)  Level 2 (Red)    Row  20 reps    Theraband Level (Shoulder Row)  Level 3 (Green)    Other Standing Exercises  Wall slides x10; Wall circles x10 ;     Other Standing Exercises  AROM:flexion and abd each x15, full motion; Ball circles at wall x20;       Shoulder Exercises: Pulleys   Flexion  2 minutes      Shoulder Exercises: Stretch   Corner Stretch  3 reps;30 seconds    Corner Stretch Limitations  for ER      Modalities   Modalities  Moist Heat;Electrical Stimulation;Cryotherapy  Cryotherapy   Number Minutes Cryotherapy  10 Minutes    Cryotherapy Location  Shoulder      Manual Therapy   Manual Therapy  Joint mobilization;Passive ROM    Joint Mobilization  L GHJ mobs, all motions, gr 3 ;     Passive ROM  L shoulder, all motions; Contract relax for IR/ER               PT Short Term Goals - 05/09/18 1004      PT SHORT TERM GOAL #1   Title  Pt to be independent with initial HEP     Time  2    Period  Weeks    Status  Achieved        PT Long Term Goals - 04/09/18 2138      PT LONG TERM GOAL #1   Title  Pt to demo L shoulder AROm, to be WNL, to improve ability for reaching, lifting, and IADLs.     Time  6    Period  Weeks    Status  New    Target Date  05/17/18      PT LONG TERM GOAL #2   Title  Pt to demo improved strength of L shoulder, to be at least 4+/5 to improve ability for IADLs and work duties.     Time  6    Period  Weeks    Status  New    Target Date  05/17/18      PT LONG TERM GOAL #3   Title  Pt to report decreased pain in L shoulder to be 0-2/10 with activity     Time  6     Status  New    Target Date  05/17/18            Plan - 05/22/18 0827    Clinical Impression Statement  Discussed need to follow up with MD if pt continues to have significant difficulty sleeping due to pain. Pt is making small/slow gains with ROM, but is making improvements in overall mobility and AROM. He is limited due to pain, especially with PROM, and ER motions, as well as sleeping.     Rehab Potential  Good    PT Frequency  2x / week    PT Duration  6 weeks    PT Treatment/Interventions  ADLs/Self Care Home Management;Cryotherapy;Electrical Stimulation;Iontophoresis 4mg /ml Dexamethasone;Moist Heat;Therapeutic activities;Functional mobility training;Ultrasound;Therapeutic exercise;Neuromuscular re-education;Patient/family education;Dry needling;Passive range of motion;Manual techniques;Taping;Joint Manipulations    Consulted and Agree with Plan of Care  Patient       Patient will benefit from skilled therapeutic intervention in order to improve the following deficits and impairments:  Decreased endurance, Hypomobility, Decreased activity tolerance, Decreased strength, Impaired UE functional use, Pain, Increased muscle spasms, Decreased mobility, Decreased range of motion, Improper body mechanics, Impaired flexibility  Visit Diagnosis: Acute pain of left shoulder  Stiffness of left shoulder, not elsewhere classified     Problem List Patient Active Problem List   Diagnosis Date Noted  . 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 HNP (herniated nucleus pulposus), cervical 03/16/2017  . 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 ([place]) 01/06/2017  . Herniation of cervical intervertebral disc with radiculopathy 01/04/2017  . Current mild episode of major depressive disorder without prior episode ([place]) 12/02/2016  . Diabetic neuropathy ([place]) 03/01/2016  . Obesity, Class II, BMI 35.0-39.9, with  comorbidity (see actual BMI) 04/24/2015  . Type 1  diabetes mellitus with hyperglycemia, with long-term current use of insulin ([place]) 04/04/2015  . Patellofemoral stress syndrome of left knee 03/19/2015  . Allergic rhinitis due to pollen 11/14/2014  . Equinus deformity of foot, acquired 05/29/2014  . ADHD (attention deficit hyperactivity disorder), inattentive type 10/24/2013  . Vitamin D deficiency 02/28/2013  . Pulmonic stenosis 05/25/2012  . Pure hyperglyceridemia 05/25/2012  . Hyperlipidemia with target LDL less than 100 09/20/2011  . OSA (obstructive sleep apnea) 09/20/2011  . BPH (benign prostatic hyperplasia) 09/20/2011  . Routine general medical examination at a health care facility 09/20/2011    Lyndee Hensen, PT, DPT 8:32 AM  05/22/18    [facility] [place], [place], [address] Phone: [phone]   Fax:  [phone]  Name: Zachary Graham MRN: [id number] Date of Birth: [birth year]

## 2018-05-23 ENCOUNTER — Encounter: Payer: Self-pay | Admitting: Physical Therapy

## 2018-05-23 ENCOUNTER — Ambulatory Visit (INDEPENDENT_AMBULATORY_CARE_PROVIDER_SITE_OTHER): Payer: 59 | Admitting: Physical Therapy

## 2018-05-23 DIAGNOSIS — M25512 Pain in left shoulder: Secondary | ICD-10-CM | POA: Diagnosis not present

## 2018-05-23 DIAGNOSIS — M25612 Stiffness of left shoulder, not elsewhere classified: Secondary | ICD-10-CM | POA: Diagnosis not present

## 2018-05-23 NOTE — Therapy (Signed)
[facility] [address] [place], [place], [address] Phone: [phone]   Fax:  [phone]  Physical Therapy Treatment/Re-Cert   Patient Details  Name: Zachary Graham MRN: [id number] Date of Birth: [birth year] Referring Provider (PT): [name]   Encounter Date: 05/23/2018  PT End of Session - 05/23/18 0851    Visit Number  10    Number of Visits  24    Date for PT Re-Evaluation  07/04/18    Authorization Type  [facility]    PT Start Time  0847    PT Stop Time  0943    PT Time Calculation (min)  56 min    Activity Tolerance  Patient tolerated treatment well    Behavior During Therapy  [facility] for tasks assessed/performed       Past Medical History:  Diagnosis Date  . Diabetes mellitus   . GERD (gastroesophageal reflux disease)   . Heart murmur   . Hyperlipidemia   . Hypertension   . OSA (obstructive sleep apnea)   . Pneumonia     Past Surgical History:  Procedure Laterality Date  . CERVICAL DISC ARTHROPLASTY N/A 03/16/2017   Procedure: CERVICAL FIVE- CERVICAL SIX [place] ARTHROPLASTY;  Surgeon: Jovita Gamma, MD;  Location: [facility];  Service: Neurosurgery;  Laterality: N/A;  CERVICAL 5- CERVICAL 6 DISC ARTHROPLASTY  . COLONOSCOPY    . TONSILLECTOMY      There were no vitals filed for this visit.  Subjective Assessment - 05/23/18 0849    Subjective  Pt states mild pain relief in last few days. He is still having trouble sleeping.    Currently in Pain?  Yes    Pain Score  2     Pain Location  Shoulder    Pain Orientation  Left    Pain Descriptors / Indicators  Aching    Pain Type  Acute pain    Pain Onset  More than a month ago    Pain Frequency  Intermittent    Aggravating Factors   reaching, sleeping, IADLS.     Pain Relieving Factors  rest, heat         [facility] PT Assessment - 05/23/18 0001      AROM   Left Shoulder Flexion  120 Degrees    Left Shoulder ABduction  120 Degrees    Left Shoulder Internal Rotation   50 Degrees    Left Shoulder External Rotation  60 Degrees      PROM   Left Shoulder Flexion  145 Degrees    Left Shoulder Internal Rotation  50 Degrees    Left Shoulder External Rotation  60 Degrees      Strength   Overall Strength Comments  taken within available range/ mid range    Left Shoulder Flexion  4/5    Left Shoulder ABduction  4/5    Left Shoulder Internal Rotation  4+/5    Left Shoulder External Rotation  4+/5                   [facility] Adult PT Treatment/Exercise - 05/23/18 0852      Exercises   Exercises  Shoulder      Shoulder Exercises: Supine   Flexion  --    Shoulder Flexion Weight (lbs)  --    Other Supine Exercises  AAROM/Cane/ 2lb      Shoulder Exercises: Sidelying   External Rotation  20 reps      Shoulder Exercises: Standing   External  Rotation  20 reps    Theraband Level (Shoulder External Rotation)  Level 3 (Green)    Internal Rotation  20 reps    Theraband Level (Shoulder Internal Rotation)  Level 3 (Green)    Row  20 reps    Theraband Level (Shoulder Row)  Level 3 (Green)    Other Standing Exercises  Wall slides x10; Wall circles x10 ; Wall Push ups x20    Other Standing Exercises  AROM:flexion and abd each x15, full motion; Ball circles at wall x20;       Shoulder Exercises: Pulleys   Flexion  2 minutes    ABduction  1 minute      Shoulder Exercises: Stretch   Corner Stretch  3 reps;30 seconds    Corner Stretch Limitations  for ER      Modalities   Modalities  Moist Heat;Electrical Stimulation;Cryotherapy      Cryotherapy   Number Minutes Cryotherapy  10 Minutes    Cryotherapy Location  Shoulder      Manual Therapy   Manual Therapy  Joint mobilization;Passive ROM    Joint Mobilization  L GHJ mobs, all motions, gr 3 ;     Passive ROM  L shoulder, all motions; Contract relax for IR/ER             PT Education - 05/23/18 1213    Education Details  HEP reviewed    Person(s) Educated  Patient    Methods  Explanation     Comprehension  Verbalized understanding       PT Short Term Goals - 05/09/18 1004      PT SHORT TERM GOAL #1   Title  Pt to be independent with initial HEP     Time  2    Period  Weeks    Status  Achieved        PT Long Term Goals - 05/23/18 1213      PT LONG TERM GOAL #1   Title  Pt to demo L shoulder AROm, to be WNL, to improve ability for reaching, lifting, and IADLs.     Time  6    Period  Weeks    Status  On-going    Target Date  07/04/18      PT LONG TERM GOAL #2   Title  Pt to demo improved strength of L shoulder, to be at least 4+/5 to improve ability for IADLs and work duties.     Time  6    Period  Weeks    Status  On-going    Target Date  07/04/18      PT LONG TERM GOAL #3   Title  Pt to report decreased pain in L shoulder to be 0-2/10 with activity     Time  6    Status  On-going    Target Date  07/04/18            Plan - 05/23/18 1214    Clinical Impression Statement  Pt with improving shoulder PROM and AROM, especially for elevation. He continues to have stiffness and pain with ER. He is making improvements in strengthening as well, and has improved ability for ther ex. He has been educated on HEP. Pt continues to have diffiuclty with increased movement, activity, lifting, carrying, and reaching with L UE. He has GHJ stiffness that is limiting full ROM. Pain continues to be main limiting factor. Pt to benefit from continued care, to improve ROM, strength, and pain.  Pt with MD follow up in 2 weeks. Recommended earlier follow up if pt continues to have significant difficulty with sleeping due to pain.     Rehab Potential  Good    PT Frequency  2x / week    PT Duration  6 weeks    PT Treatment/Interventions  ADLs/Self Care Home Management;Cryotherapy;Electrical Stimulation;Iontophoresis 4mg /ml Dexamethasone;Moist Heat;Therapeutic activities;Functional mobility training;Ultrasound;Therapeutic exercise;Neuromuscular re-education;Patient/family  education;Dry needling;Passive range of motion;Manual techniques;Taping;Joint Manipulations    Consulted and Agree with Plan of Care  Patient       Patient will benefit from skilled therapeutic intervention in order to improve the following deficits and impairments:  Decreased endurance, Hypomobility, Decreased activity tolerance, Decreased strength, Impaired UE functional use, Pain, Increased muscle spasms, Decreased mobility, Decreased range of motion, Improper body mechanics, Impaired flexibility  Visit Diagnosis: Acute pain of left shoulder - Plan: PT plan of care cert/re-cert  Stiffness of left shoulder, not elsewhere classified - Plan: PT plan of care cert/re-cert     Problem List Patient Active Problem List   Diagnosis Date Noted  . 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 HNP (herniated nucleus pulposus), cervical 03/16/2017  . 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 ([place]) 01/06/2017  . Herniation of cervical intervertebral disc with radiculopathy 01/04/2017  . Current mild episode of major depressive disorder without prior episode ([place]) 12/02/2016  . Diabetic neuropathy ([place]) 03/01/2016  . Obesity, Class II, BMI 35.0-39.9, with comorbidity (see actual BMI) 04/24/2015  . Type 1 diabetes mellitus with hyperglycemia, with long-term current use of insulin ([place]) 04/04/2015  . Patellofemoral stress syndrome of left knee 03/19/2015  . Allergic rhinitis due to pollen 11/14/2014  . Equinus deformity of foot, acquired 05/29/2014  . ADHD (attention deficit hyperactivity disorder), inattentive type 10/24/2013  . Vitamin D deficiency 02/28/2013  . Pulmonic stenosis 05/25/2012  . Pure hyperglyceridemia 05/25/2012  . Hyperlipidemia with target LDL less than 100 09/20/2011  . OSA (obstructive sleep apnea) 09/20/2011  . BPH (benign prostatic hyperplasia) 09/20/2011  . Routine general medical  examination at a health care facility 09/20/2011    Lyndee Hensen, PT, DPT 12:38 PM  05/23/18    [facility] [place], [place], [address] Phone: [phone]   Fax:  [phone]  Name: YOLTZIN BARG MRN: [id number] Date of Birth: 26-Apr-1961

## 2018-05-25 ENCOUNTER — Encounter: Payer: Self-pay | Admitting: Physical Therapy

## 2018-05-25 ENCOUNTER — Ambulatory Visit (INDEPENDENT_AMBULATORY_CARE_PROVIDER_SITE_OTHER): Payer: 59 | Admitting: Physical Therapy

## 2018-05-25 DIAGNOSIS — M25512 Pain in left shoulder: Secondary | ICD-10-CM

## 2018-05-25 DIAGNOSIS — M25612 Stiffness of left shoulder, not elsewhere classified: Secondary | ICD-10-CM | POA: Diagnosis not present

## 2018-05-25 NOTE — Therapy (Signed)
[facility] [address] [place], [place], [address] Phone: [phone]   Fax:  [phone]  Physical Therapy Treatment  Patient Details  Name: Zachary Graham MRN: [id number] Date of Birth: 1960/10/30 Referring Provider (PT): [name]   Encounter Date: 05/25/2018  PT End of Session - 05/25/18 1439    Visit Number  11    Number of Visits  24    Date for PT Re-Evaluation  07/04/18    Authorization Type  [facility]    PT Start Time  0847    PT Stop Time  0929    PT Time Calculation (min)  42 min    Activity Tolerance  Patient tolerated treatment well    Behavior During Therapy  [facility] for tasks assessed/performed       Past Medical History:  Diagnosis Date  . Diabetes mellitus   . GERD (gastroesophageal reflux disease)   . Heart murmur   . Hyperlipidemia   . Hypertension   . OSA (obstructive sleep apnea)   . Pneumonia     Past Surgical History:  Procedure Laterality Date  . CERVICAL DISC ARTHROPLASTY N/A 03/16/2017   Procedure: CERVICAL FIVE- CERVICAL SIX [place] ARTHROPLASTY;  Surgeon: Jovita Gamma, MD;  Location: [facility];  Service: Neurosurgery;  Laterality: N/A;  CERVICAL 5- CERVICAL 6 DISC ARTHROPLASTY  . COLONOSCOPY    . TONSILLECTOMY      There were no vitals filed for this visit.  Subjective Assessment - 05/25/18 1438    Subjective  Pt with no new complaints. Did not sleep well last few nights.     Currently in Pain?  Yes    Pain Score  3     Pain Location  Shoulder    Pain Orientation  Left    Pain Descriptors / Indicators  Aching    Pain Type  Acute pain    Pain Onset  More than a month ago    Pain Frequency  Intermittent                       [facility] Adult PT Treatment/Exercise - 05/25/18 0853      Exercises   Exercises  Shoulder      Shoulder Exercises: Supine   Other Supine Exercises  AAROM/Cane/ 2lb      Shoulder Exercises: Sidelying   External Rotation  20 reps      Shoulder  Exercises: Standing   External Rotation  20 reps    Theraband Level (Shoulder External Rotation)  Level 3 (Green)    Internal Rotation  20 reps    Theraband Level (Shoulder Internal Rotation)  Level 3 (Green)    Row  20 reps    Theraband Level (Shoulder Row)  Level 3 (Green)    Other Standing Exercises  Wall slides x10; Wall circles x10 ; Wall Push ups x20    Other Standing Exercises  AROM:flexion and abd each x15, full motion; Ball circles at wall x20;       Shoulder Exercises: Pulleys   Flexion  2 minutes    ABduction  1 minute      Shoulder Exercises: Stretch   Corner Stretch  3 reps;30 seconds    Corner Stretch Limitations  for ER      Modalities   Modalities  Moist Heat;Electrical Stimulation;Cryotherapy      Cryotherapy   Cryotherapy Location  Shoulder      Manual Therapy   Manual Therapy  Joint mobilization;Passive  ROM    Joint Mobilization  L GHJ mobs, all motions, gr 3 ;     Passive ROM  L shoulder, all motions; Contract relax for IR/ER             PT Education - 05/25/18 1439    Education Details  HEP review. D/C plan discussed    Person(s) Educated  Patient    Methods  Explanation    Comprehension  Verbalized understanding       PT Short Term Goals - 05/09/18 1004      PT SHORT TERM GOAL #1   Title  Pt to be independent with initial HEP     Time  2    Period  Weeks    Status  Achieved        PT Long Term Goals - 05/23/18 1213      PT LONG TERM GOAL #1   Title  Pt to demo L shoulder AROm, to be WNL, to improve ability for reaching, lifting, and IADLs.     Time  6    Period  Weeks    Status  On-going    Target Date  07/04/18      PT LONG TERM GOAL #2   Title  Pt to demo improved strength of L shoulder, to be at least 4+/5 to improve ability for IADLs and work duties.     Time  6    Period  Weeks    Status  On-going    Target Date  07/04/18      PT LONG TERM GOAL #3   Title  Pt to report decreased pain in L shoulder to be 0-2/10 with  activity     Time  6    Status  On-going    Target Date  07/04/18            Plan - 05/25/18 1440    Clinical Impression Statement  Pt with near full AROM for elevation compared to R side today. Plan to work towards d/c in next few visits. Pt will keep original appt with MD, but will discuss sleeping/ pain when sleeping with MD when he returns. Pt with improving ROM and function, but continues to have soreness, and stiffness for rotation motions.     Rehab Potential  Good    PT Frequency  2x / week    PT Duration  6 weeks    PT Treatment/Interventions  ADLs/Self Care Home Management;Cryotherapy;Electrical Stimulation;Iontophoresis 4mg /ml Dexamethasone;Moist Heat;Therapeutic activities;Functional mobility training;Ultrasound;Therapeutic exercise;Neuromuscular re-education;Patient/family education;Dry needling;Passive range of motion;Manual techniques;Taping;Joint Manipulations    Consulted and Agree with Plan of Care  Patient       Patient will benefit from skilled therapeutic intervention in order to improve the following deficits and impairments:  Decreased endurance, Hypomobility, Decreased activity tolerance, Decreased strength, Impaired UE functional use, Pain, Increased muscle spasms, Decreased mobility, Decreased range of motion, Improper body mechanics, Impaired flexibility  Visit Diagnosis: Acute pain of left shoulder  Stiffness of left shoulder, not elsewhere classified     Problem List Patient Active Problem List   Diagnosis Date Noted  . 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 HNP (herniated nucleus pulposus), cervical 03/16/2017  . 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 ([place]) 01/06/2017  . Herniation of cervical intervertebral disc with radiculopathy 01/04/2017  . Current mild episode of major depressive disorder without prior episode ([place]) 12/02/2016  . Diabetic neuropathy  ([place]) 03/01/2016  .  Obesity, Class II, BMI 35.0-39.9, with comorbidity (see actual BMI) 04/24/2015  . Type 1 diabetes mellitus with hyperglycemia, with long-term current use of insulin ([place]) 04/04/2015  . Patellofemoral stress syndrome of left knee 03/19/2015  . Allergic rhinitis due to pollen 11/14/2014  . Equinus deformity of foot, acquired 05/29/2014  . ADHD (attention deficit hyperactivity disorder), inattentive type 10/24/2013  . Vitamin D deficiency 02/28/2013  . Pulmonic stenosis 05/25/2012  . Pure hyperglyceridemia 05/25/2012  . Hyperlipidemia with target LDL less than 100 09/20/2011  . OSA (obstructive sleep apnea) 09/20/2011  . BPH (benign prostatic hyperplasia) 09/20/2011  . Routine general medical examination at a health care facility 09/20/2011    Lyndee Hensen, PT, DPT 3:27 PM  05/25/18    [facility] [place], [place], [address] Phone: [phone]   Fax:  [phone]  Name: Zachary Graham MRN: [id number] Date of Birth: 1961-01-09

## 2018-05-26 MED FILL — FREESTYLE LIBRE 14 DAY SENS: 28 days supply | Qty: 2 | Fill #3

## 2018-05-29 ENCOUNTER — Encounter: Payer: Self-pay | Admitting: Physical Therapy

## 2018-05-29 ENCOUNTER — Ambulatory Visit (INDEPENDENT_AMBULATORY_CARE_PROVIDER_SITE_OTHER): Payer: 59 | Admitting: Physical Therapy

## 2018-05-29 DIAGNOSIS — M25612 Stiffness of left shoulder, not elsewhere classified: Secondary | ICD-10-CM | POA: Diagnosis not present

## 2018-05-29 DIAGNOSIS — M25512 Pain in left shoulder: Secondary | ICD-10-CM | POA: Diagnosis not present

## 2018-05-29 NOTE — Therapy (Signed)
[facility] [address] [place], [place], [address] Phone: [phone]   Fax:  [phone]  Physical Therapy Treatment  Patient Details  Name: Zachary Graham MRN: [id number] Date of Birth: 1960/07/08 Referring Provider (PT): [name]   Encounter Date: 05/29/2018  PT End of Session - 05/29/18 0859    Visit Number  12    Number of Visits  24    Date for PT Re-Evaluation  07/04/18    Authorization Type  [facility]    PT Start Time  0852    PT Stop Time  0915    PT Time Calculation (min)  23 min    Activity Tolerance  Patient tolerated treatment well    Behavior During Therapy  [facility] for tasks assessed/performed       Past Medical History:  Diagnosis Date  . Diabetes mellitus   . GERD (gastroesophageal reflux disease)   . Heart murmur   . Hyperlipidemia   . Hypertension   . OSA (obstructive sleep apnea)   . Pneumonia     Past Surgical History:  Procedure Laterality Date  . CERVICAL DISC ARTHROPLASTY N/A 03/16/2017   Procedure: CERVICAL FIVE- CERVICAL SIX [place] ARTHROPLASTY;  Surgeon: Jovita Gamma, MD;  Location: [place] [facility];  Service: Neurosurgery;  Laterality: N/A;  CERVICAL 5- CERVICAL 6 DISC ARTHROPLASTY  . COLONOSCOPY    . TONSILLECTOMY      There were no vitals filed for this visit.  Subjective Assessment - 05/29/18 0858    Subjective  Pt had fall off of treadmill yesterday, states increased pain in L shoulder/arm from jolt of grabbing bar and falling.     Currently in Pain?  Yes    Pain Score  4     Pain Location  Shoulder    Pain Orientation  Left    Pain Descriptors / Indicators  Aching    Pain Type  Acute pain    Pain Onset  More than a month ago    Pain Frequency  Intermittent         [facility] PT Assessment - 05/29/18 0001      Strength   Left Shoulder Internal Rotation  4+/5    Left Shoulder External Rotation  4+/5                   [facility] Adult PT Treatment/Exercise - 05/29/18 0001      Shoulder Exercises: Standing   Other Standing Exercises  AROM: flexion: x5;       Shoulder Exercises: Pulleys   Flexion  2 minutes    ABduction  2 minutes      Cryotherapy   Number Minutes Cryotherapy  10 Minutes    Cryotherapy Location  Shoulder      Electrical Stimulation   Electrical Stimulation Location  L shoulder    Electrical Stimulation Parameters  Pre-Mod    Electrical Stimulation Goals  Pain      Manual Therapy   Joint Mobilization  L GHJ mobs, all motions, gr 2-3 ;     Passive ROM  L shoulder, all motions;                PT Short Term Goals - 05/09/18 1004      PT SHORT TERM GOAL #1   Title  Pt to be independent with initial HEP     Time  2    Period  Weeks    Status  Achieved  PT Long Term Goals - 05/23/18 1213      PT LONG TERM GOAL #1   Title  Pt to demo L shoulder AROm, to be WNL, to improve ability for reaching, lifting, and IADLs.     Time  6    Period  Weeks    Status  On-going    Target Date  07/04/18      PT LONG TERM GOAL #2   Title  Pt to demo improved strength of L shoulder, to be at least 4+/5 to improve ability for IADLs and work duties.     Time  6    Period  Weeks    Status  On-going    Target Date  07/04/18      PT LONG TERM GOAL #3   Title  Pt to report decreased pain in L shoulder to be 0-2/10 with activity     Time  6    Status  On-going    Target Date  07/04/18            Plan - 05/29/18 0917    Clinical Impression Statement  Pt with increased soreness today, with all motions. No increased weakness with strength testing for IR/ER. Pt educated on decreasing strengthening for HEP this week, but continuing AAROM and AROM as able. Tens and ice used today for pain. Pt late to appt, and also requests to leave early due to work commitment.     Rehab Potential  Good    PT Frequency  2x / week    PT Duration  6 weeks    PT Treatment/Interventions  ADLs/Self Care Home Management;Cryotherapy;Electrical  Stimulation;Iontophoresis 4mg /ml Dexamethasone;Moist Heat;Therapeutic activities;Functional mobility training;Ultrasound;Therapeutic exercise;Neuromuscular re-education;Patient/family education;Dry needling;Passive range of motion;Manual techniques;Taping;Joint Manipulations    Consulted and Agree with Plan of Care  Patient       Patient will benefit from skilled therapeutic intervention in order to improve the following deficits and impairments:  Decreased endurance, Hypomobility, Decreased activity tolerance, Decreased strength, Impaired UE functional use, Pain, Increased muscle spasms, Decreased mobility, Decreased range of motion, Improper body mechanics, Impaired flexibility  Visit Diagnosis: Acute pain of left shoulder  Stiffness of left shoulder, not elsewhere classified     Problem List Patient Active Problem List   Diagnosis Date Noted  . 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 HNP (herniated nucleus pulposus), cervical 03/16/2017  . 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 ([place]) 01/06/2017  . Herniation of cervical intervertebral disc with radiculopathy 01/04/2017  . Current mild episode of major depressive disorder without prior episode ([place]) 12/02/2016  . Diabetic neuropathy ([place]) 03/01/2016  . Obesity, Class II, BMI 35.0-39.9, with comorbidity (see actual BMI) 04/24/2015  . Type 1 diabetes mellitus with hyperglycemia, with long-term current use of insulin ([place]) 04/04/2015  . Patellofemoral stress syndrome of left knee 03/19/2015  . Allergic rhinitis due to pollen 11/14/2014  . Equinus deformity of foot, acquired 05/29/2014  . ADHD (attention deficit hyperactivity disorder), inattentive type 10/24/2013  . Vitamin D deficiency 02/28/2013  . Pulmonic stenosis 05/25/2012  . Pure hyperglyceridemia 05/25/2012  . Hyperlipidemia with target LDL less than 100 09/20/2011  . OSA (obstructive  sleep apnea) 09/20/2011  . BPH (benign prostatic hyperplasia) 09/20/2011  . Routine general medical examination at a health care facility 09/20/2011    Lyndee Hensen, PT, DPT 9:21 AM  05/29/18    [facility] [place], [place],  [address] Phone: [phone]   Fax:  [phone]  Name: CHIGOZIE BASALDUA MRN: [id number] Date of Birth: [birth year]

## 2018-06-02 ENCOUNTER — Other Ambulatory Visit: Payer: Self-pay | Admitting: Internal Medicine

## 2018-06-02 DIAGNOSIS — E1065 Type 1 diabetes mellitus with hyperglycemia: Secondary | ICD-10-CM

## 2018-06-05 MED FILL — metFORMIN HCL 500 MG TABS: 500 | 90 days supply | Qty: 180 | Fill #0

## 2018-06-06 ENCOUNTER — Ambulatory Visit (INDEPENDENT_AMBULATORY_CARE_PROVIDER_SITE_OTHER): Payer: 59 | Admitting: Physical Therapy

## 2018-06-06 DIAGNOSIS — M25512 Pain in left shoulder: Secondary | ICD-10-CM | POA: Diagnosis not present

## 2018-06-06 DIAGNOSIS — M25612 Stiffness of left shoulder, not elsewhere classified: Secondary | ICD-10-CM | POA: Diagnosis not present

## 2018-06-06 NOTE — Therapy (Signed)
[facility] [address] [place], [place], [address] Phone: [phone]   Fax:  [phone]  Physical Therapy Treatment  Patient Details  Name: Zachary Graham MRN: [id number] Date of Birth: [birth year] Referring Provider (PT): [name]   Encounter Date: 06/06/2018  PT End of Session - 06/08/18 2225    Visit Number  13    Number of Visits  24    Date for PT Re-Evaluation  07/04/18    Authorization Type  [facility]    PT Start Time  0850    PT Stop Time  0935    PT Time Calculation (min)  45 min    Activity Tolerance  Patient tolerated treatment well    Behavior During Therapy  [facility] for tasks assessed/performed       Past Medical History:  Diagnosis Date  . Diabetes mellitus   . GERD (gastroesophageal reflux disease)   . Heart murmur   . Hyperlipidemia   . Hypertension   . OSA (obstructive sleep apnea)   . Pneumonia     Past Surgical History:  Procedure Laterality Date  . CERVICAL DISC ARTHROPLASTY N/A 03/16/2017   Procedure: CERVICAL FIVE- CERVICAL SIX [place] ARTHROPLASTY;  Surgeon: Jovita Gamma, MD;  Location: [place];  Service: Neurosurgery;  Laterality: N/A;  CERVICAL 5- CERVICAL 6 DISC ARTHROPLASTY  . COLONOSCOPY    . TONSILLECTOMY      There were no vitals filed for this visit.  Subjective Assessment - 06/08/18 2224    Subjective  Pt has follow up with MD thursday. He states improving pain, pain less often and less intense. Still feels shoulder is tight, and has pain with behind the back motions as well as reaching out.     Patient Stated Goals  decreased pain , improved movement    Currently in Pain?  Yes    Pain Score  2     Pain Location  Shoulder    Pain Orientation  Left    Pain Descriptors / Indicators  Aching    Pain Type  Acute pain    Pain Onset  More than a month ago    Pain Frequency  Intermittent                       [facility] Adult PT Treatment/Exercise - 06/08/18 0001      Shoulder  Exercises: Supine   Protraction  20 reps    Protraction Limitations  SA punch    External Rotation  20 reps    External Rotation Limitations  cane    Other Supine Exercises  AAROM/Cane/ 2lb x15    Other Supine Exercises  Pec stretch 30 sec x3 (manual), then active with nerve glide x20       Shoulder Exercises: Standing   Other Standing Exercises  Wall slides x20    Other Standing Exercises  AROM: flexion: x20, abd x20 full range      Shoulder Exercises: Pulleys   Flexion  2 minutes    ABduction  2 minutes      Shoulder Exercises: Stretch   Corner Stretch  3 reps;30 seconds    Corner Stretch Limitations  at 45 and 90 deg    Internal Rotation Stretch  5 reps    Internal Rotation Stretch Limitations  behind back /2 hands      Cryotherapy   Cryotherapy Location  Shoulder      Electrical Stimulation   Electrical Stimulation Location  L  shoulder      Manual Therapy   Joint Mobilization  L GHJ mobs, all motions, gr 2-3 ;     Passive ROM  L shoulder, all motions;                PT Short Term Goals - 05/09/18 1004      PT SHORT TERM GOAL #1   Title  Pt to be independent with initial HEP     Time  2    Period  Weeks    Status  Achieved        PT Long Term Goals - 05/23/18 1213      PT LONG TERM GOAL #1   Title  Pt to demo L shoulder AROm, to be WNL, to improve ability for reaching, lifting, and IADLs.     Time  6    Period  Weeks    Status  On-going    Target Date  07/04/18      PT LONG TERM GOAL #2   Title  Pt to demo improved strength of L shoulder, to be at least 4+/5 to improve ability for IADLs and work duties.     Time  6    Period  Weeks    Status  On-going    Target Date  07/04/18      PT LONG TERM GOAL #3   Title  Pt to report decreased pain in L shoulder to be 0-2/10 with activity     Time  6    Status  On-going    Target Date  07/04/18            Plan - 06/08/18 2227    Clinical Impression Statement  Pt with MD appt later this week.  Pt has made good improvments with AROM, and has improved pain levels, but continues to have pain with increased activity and repeated motions. ER and IR behind the back are sill most restricted and most painful.  Pt has been diligent with HEP, but further improvements have been slow. Will continue per MD recommendation.     Rehab Potential  Good    PT Frequency  2x / week    PT Duration  6 weeks    PT Treatment/Interventions  ADLs/Self Care Home Management;Cryotherapy;Electrical Stimulation;Iontophoresis 4mg /ml Dexamethasone;Moist Heat;Therapeutic activities;Functional mobility training;Ultrasound;Therapeutic exercise;Neuromuscular re-education;Patient/family education;Dry needling;Passive range of motion;Manual techniques;Taping;Joint Manipulations    Consulted and Agree with Plan of Care  Patient       Patient will benefit from skilled therapeutic intervention in order to improve the following deficits and impairments:  Decreased endurance, Hypomobility, Decreased activity tolerance, Decreased strength, Impaired UE functional use, Pain, Increased muscle spasms, Decreased mobility, Decreased range of motion, Improper body mechanics, Impaired flexibility  Visit Diagnosis: Acute pain of left shoulder  Stiffness of left shoulder, not elsewhere classified     Problem List Patient Active Problem List   Diagnosis Date Noted  . Nocturnal muscle cramps 06/08/2018  . 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 HNP (herniated nucleus pulposus), cervical 03/16/2017  . 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 ([place]) 01/06/2017  . Herniation of cervical intervertebral disc with radiculopathy 01/04/2017  . Current mild episode of major depressive disorder without prior episode ([place]) 12/02/2016  . Diabetic neuropathy ([place]) 03/01/2016  . Obesity, Class II, BMI 35.0-39.9, with comorbidity (see actual BMI) 04/24/2015  .  Type 1 diabetes mellitus with hyperglycemia, with long-term current use of insulin (  [place]) 04/04/2015  . Patellofemoral stress syndrome of left knee 03/19/2015  . Allergic rhinitis due to pollen 11/14/2014  . Equinus deformity of foot, acquired 05/29/2014  . ADHD (attention deficit hyperactivity disorder), inattentive type 10/24/2013  . Vitamin D deficiency 02/28/2013  . Pulmonic stenosis 05/25/2012  . Pure hyperglyceridemia 05/25/2012  . Hyperlipidemia with target LDL less than 100 09/20/2011  . OSA (obstructive sleep apnea) 09/20/2011  . BPH (benign prostatic hyperplasia) 09/20/2011  . Routine general medical examination at a health care facility 09/20/2011    Lyndee Hensen, PT, DPT 10:29 PM  06/08/18    [facility] [place], [place], [address] Phone: [phone]   Fax:  [phone]  Name: GRIGOR LIPSCHUTZ MRN: [id number] Date of Birth: 21-May-1960

## 2018-06-08 ENCOUNTER — Ambulatory Visit (INDEPENDENT_AMBULATORY_CARE_PROVIDER_SITE_OTHER): Payer: 59 | Admitting: Family Medicine

## 2018-06-08 ENCOUNTER — Ambulatory Visit (INDEPENDENT_AMBULATORY_CARE_PROVIDER_SITE_OTHER): Payer: 59 | Admitting: Physical Therapy

## 2018-06-08 ENCOUNTER — Encounter: Payer: Self-pay | Admitting: Internal Medicine

## 2018-06-08 ENCOUNTER — Ambulatory Visit (INDEPENDENT_AMBULATORY_CARE_PROVIDER_SITE_OTHER): Payer: 59 | Admitting: Internal Medicine

## 2018-06-08 ENCOUNTER — Other Ambulatory Visit (INDEPENDENT_AMBULATORY_CARE_PROVIDER_SITE_OTHER): Payer: 59

## 2018-06-08 ENCOUNTER — Encounter: Payer: Self-pay | Admitting: Physical Therapy

## 2018-06-08 VITALS — Ht 70.0 in

## 2018-06-08 VITALS — BP 122/70 | HR 72 | Temp 97.3°F | Ht 70.0 in | Wt 231.0 lb

## 2018-06-08 DIAGNOSIS — E103299 Type 1 diabetes mellitus with mild nonproliferative diabetic retinopathy without macular edema, unspecified eye: Secondary | ICD-10-CM

## 2018-06-08 DIAGNOSIS — M25612 Stiffness of left shoulder, not elsewhere classified: Secondary | ICD-10-CM | POA: Diagnosis not present

## 2018-06-08 DIAGNOSIS — F900 Attention-deficit hyperactivity disorder, predominantly inattentive type: Secondary | ICD-10-CM | POA: Diagnosis not present

## 2018-06-08 DIAGNOSIS — M2550 Pain in unspecified joint: Secondary | ICD-10-CM

## 2018-06-08 DIAGNOSIS — M25512 Pain in left shoulder: Secondary | ICD-10-CM | POA: Diagnosis not present

## 2018-06-08 DIAGNOSIS — M7500 Adhesive capsulitis of unspecified shoulder: Secondary | ICD-10-CM

## 2018-06-08 DIAGNOSIS — E10618 Type 1 diabetes mellitus with other diabetic arthropathy: Secondary | ICD-10-CM

## 2018-06-08 DIAGNOSIS — R252 Cramp and spasm: Secondary | ICD-10-CM | POA: Diagnosis not present

## 2018-06-08 DIAGNOSIS — E1065 Type 1 diabetes mellitus with hyperglycemia: Secondary | ICD-10-CM | POA: Diagnosis not present

## 2018-06-08 LAB — CBC WITH DIFFERENTIAL/PLATELET
BASOS ABS: 0.2 10*3/uL — AB (ref 0.0–0.1)
Basophils Relative: 2.5 % (ref 0.0–3.0)
Eosinophils Absolute: 0.3 10*3/uL (ref 0.0–0.7)
Eosinophils Relative: 4.4 % (ref 0.0–5.0)
HCT: 40.4 % (ref 39.0–52.0)
Hemoglobin: 13.8 g/dL (ref 13.0–17.0)
LYMPHS ABS: 2 10*3/uL (ref 0.7–4.0)
Lymphocytes Relative: 25.4 % (ref 12.0–46.0)
MCHC: 34.1 g/dL (ref 30.0–36.0)
MCV: 83.5 fl (ref 78.0–100.0)
Monocytes Absolute: 0.5 10*3/uL (ref 0.1–1.0)
Monocytes Relative: 6.9 % (ref 3.0–12.0)
Neutro Abs: 4.8 10*3/uL (ref 1.4–7.7)
Neutrophils Relative %: 60.8 % (ref 43.0–77.0)
Platelets: 224 10*3/uL (ref 150.0–400.0)
RBC: 4.84 Mil/uL (ref 4.22–5.81)
RDW: 14.4 % (ref 11.5–15.5)
WBC: 8 10*3/uL (ref 4.0–10.5)

## 2018-06-08 LAB — COMPREHENSIVE METABOLIC PANEL
ALBUMIN: 4.2 g/dL (ref 3.5–5.2)
ALK PHOS: 90 U/L (ref 39–117)
ALT: 21 U/L (ref 0–53)
AST: 14 U/L (ref 0–37)
BUN: 16 mg/dL (ref 6–23)
CO2: 24 mEq/L (ref 19–32)
Calcium: 9.5 mg/dL (ref 8.4–10.5)
Chloride: 102 mEq/L (ref 96–112)
Creatinine, Ser: 0.75 mg/dL (ref 0.40–1.50)
GFR: 108.06 mL/min (ref >60.00)
Glucose, Bld: 196 mg/dL — ABNORMAL HIGH (ref 70–99)
Potassium: 4.1 mEq/L (ref 3.5–5.1)
Sodium: 137 mEq/L (ref 135–145)
Total Bilirubin: 0.7 mg/dL (ref 0.2–1.2)
Total Protein: 6.7 g/dL (ref 6.0–8.3)

## 2018-06-08 LAB — IBC PANEL
Iron: 58 ug/dL (ref 42–165)
Saturation Ratios: 14.2 % — ABNORMAL LOW (ref 20.0–50.0)
Transferrin: 291 mg/dL (ref 212.0–360.0)

## 2018-06-08 LAB — PHOSPHORUS: Phosphorus: 3.4 mg/dL (ref 2.3–4.6)

## 2018-06-08 LAB — MAGNESIUM: Magnesium: 1.8 mg/dL (ref 1.5–2.5)

## 2018-06-08 LAB — TSH: TSH: 2.83 u[IU]/mL (ref 0.35–4.50)

## 2018-06-08 LAB — SEDIMENTATION RATE: Sed Rate: 13 mm/hr (ref 0–20)

## 2018-06-08 LAB — CK: Total CK: 87 U/L (ref 7–232)

## 2018-06-08 LAB — FERRITIN: Ferritin: 76.1 ng/mL (ref 22.0–322.0)

## 2018-06-08 MED ORDER — TRAZODONE HCL 50 MG PO TABS: 25.0000 mg | ORAL_TABLET | Freq: Every evening | ORAL | 3 refills | Status: DC | PRN

## 2018-06-08 MED ORDER — MAGNESIUM OXIDE 400 MG PO CAPS: 1.0000 | ORAL_CAPSULE | Freq: Every day | ORAL | 1 refills | Status: AC

## 2018-06-08 MED FILL — traZODone HCL 50 MG TABS: 50 | 30 days supply | Qty: 30 | Fill #0

## 2018-06-08 NOTE — Assessment & Plan Note (Addendum)
Patient is doing relatively well overall.  Do not feel that we need to redo a repeat injection.  Would like to still have patient's range of motion though improved from what we have seen at the moment the patient's pain is significantly improved.  Patient will continue with formal physical therapy for another 4 weeks.  Patient will follow-up with me in 6 weeks.  At that time we will consider potential other injections.  Labs ordered to check vitamin D and thyroid to make sure they are not contributing.

## 2018-06-08 NOTE — Therapy (Signed)
[facility] [address] [place], [place], [address] Phone: [phone]   Fax:  [phone]  Physical Therapy Treatment/Re-Cert   Patient Details  Name: Zachary Graham MRN: [id number] Date of Birth: 1960/09/15 Referring Provider (PT): [name]   Encounter Date: 06/08/2018  PT End of Session - 06/08/18 2235    Visit Number  14    Number of Visits  24    Date for PT Re-Evaluation  07/06/18    Authorization Type  [facility]    PT Start Time  1604    PT Stop Time  1649    PT Time Calculation (min)  45 min    Activity Tolerance  Patient tolerated treatment well    Behavior During Therapy  [facility] for tasks assessed/performed       Past Medical History:  Diagnosis Date  . Diabetes mellitus   . GERD (gastroesophageal reflux disease)   . Heart murmur   . Hyperlipidemia   . Hypertension   . OSA (obstructive sleep apnea)   . Pneumonia     Past Surgical History:  Procedure Laterality Date  . CERVICAL DISC ARTHROPLASTY N/A 03/16/2017   Procedure: CERVICAL FIVE- CERVICAL SIX [place] ARTHROPLASTY;  Surgeon: Jovita Gamma, MD;  Location: [place];  Service: Neurosurgery;  Laterality: N/A;  CERVICAL 5- CERVICAL 6 DISC ARTHROPLASTY  . COLONOSCOPY    . TONSILLECTOMY      There were no vitals filed for this visit.  Subjective Assessment - 06/08/18 2235    Subjective  Pt had MD follow up, who recommended continued PT for improving ROM.     Currently in Pain?  Yes    Pain Score  3     Pain Location  Shoulder    Pain Orientation  Left    Pain Descriptors / Indicators  Aching    Pain Type  Acute pain    Pain Onset  More than a month ago    Pain Frequency  Intermittent         [facility] PT Assessment - 06/08/18 1609      AROM   Left Shoulder Flexion  120 Degrees   R: 135   Left Shoulder ABduction  120 Degrees    Left Shoulder Internal Rotation  55 Degrees    Left Shoulder External Rotation  65 Degrees      PROM   Left Shoulder Flexion   145 Degrees   R: 165   Left Shoulder ABduction  145 Degrees    Left Shoulder Internal Rotation  55 Degrees    Left Shoulder External Rotation  65 Degrees      Strength   Overall Strength Comments  taken within available range/ mid range    Left Shoulder Flexion  4+/5    Left Shoulder ABduction  4+/5    Left Shoulder Internal Rotation  4+/5    Left Shoulder External Rotation  4+/5                   [facility] Adult PT Treatment/Exercise - 06/08/18 2230      Shoulder Exercises: Supine   Protraction  20 reps    Protraction Limitations  SA punch    External Rotation  20 reps    External Rotation Limitations  cane    Flexion  20 reps;AAROM    Flexion Limitations  cane      Shoulder Exercises: Standing   Flexion  10 reps;AROM    ABduction  10 reps;AROM  Shoulder Exercises: Pulleys   Flexion  2 minutes      Shoulder Exercises: Stretch   Internal Rotation Stretch  5 reps    Internal Rotation Stretch Limitations  behind back /stick      Modalities   Modalities  Ultrasound      Cryotherapy   Cryotherapy Location  Shoulder      Electrical Stimulation   Electrical Stimulation Location  L shoulder      Ultrasound   Ultrasound Location  L anterior shoulder/bicep tendon    Ultrasound Parameters  Pulsed 50 %    Ultrasound Goals  Pain      Manual Therapy   Manual therapy comments  STM to L anterior shoulder pec and bicep region;     Joint Mobilization  L GHJ mobs, all motions, gr 2-3 ;     Passive ROM  L shoulder, all motions;                PT Short Term Goals - 05/09/18 1004      PT SHORT TERM GOAL #1   Title  Pt to be independent with initial HEP     Time  2    Period  Weeks    Status  Achieved        PT Long Term Goals - 06/08/18 2238      PT LONG TERM GOAL #1   Title  Pt to demo L shoulder AROm, to be WNL, to improve ability for reaching, lifting, and IADLs.     Time  6    Period  Weeks    Status  On-going    Target Date  07/06/18       PT LONG TERM GOAL #2   Title  Pt to demo improved strength of L shoulder, to be at least 4+/5 to improve ability for IADLs and work duties.     Time  6    Period  Weeks    Status  Partially Met    Target Date  07/06/18      PT LONG TERM GOAL #3   Title  Pt to report decreased pain in L shoulder to be 0-2/10 with activity     Time  6    Status  Partially Met    Target Date  07/06/18            Plan - 06/08/18 2238    Clinical Impression Statement  Pt showing slow improvements. He does have improving pain and improving function, but continues to have limited ROM readings, and stiffness in GHJ. He has limited/painful ER, and IR behind the back. MD recommendations continue PT at this time. Plan to continue to focus on Pain and ROM as main goals.     Rehab Potential  Good    PT Frequency  2x / week    PT Duration  4 weeks    PT Treatment/Interventions  ADLs/Self Care Home Management;Cryotherapy;Electrical Stimulation;Iontophoresis 33m/ml Dexamethasone;Moist Heat;Therapeutic activities;Functional mobility training;Ultrasound;Therapeutic exercise;Neuromuscular re-education;Patient/family education;Dry needling;Passive range of motion;Manual techniques;Taping;Joint Manipulations    Consulted and Agree with Plan of Care  Patient       Patient will benefit from skilled therapeutic intervention in order to improve the following deficits and impairments:  Decreased endurance, Hypomobility, Decreased activity tolerance, Decreased strength, Impaired UE functional use, Pain, Increased muscle spasms, Decreased mobility, Decreased range of motion, Improper body mechanics, Impaired flexibility  Visit Diagnosis: Acute pain of left shoulder  Stiffness of left shoulder, not elsewhere classified  Problem List Patient Active Problem List   Diagnosis Date Noted  . Nocturnal muscle cramps 06/08/2018  . 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 HNP  (herniated nucleus pulposus), cervical 03/16/2017  . 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 ([place]) 01/06/2017  . Herniation of cervical intervertebral disc with radiculopathy 01/04/2017  . Current mild episode of major depressive disorder without prior episode ([place]) 12/02/2016  . Diabetic neuropathy ([place]) 03/01/2016  . Obesity, Class II, BMI 35.0-39.9, with comorbidity (see actual BMI) 04/24/2015  . Type 1 diabetes mellitus with hyperglycemia, with long-term current use of insulin ([place]) 04/04/2015  . Patellofemoral stress syndrome of left knee 03/19/2015  . Allergic rhinitis due to pollen 11/14/2014  . Equinus deformity of foot, acquired 05/29/2014  . ADHD (attention deficit hyperactivity disorder), inattentive type 10/24/2013  . Vitamin D deficiency 02/28/2013  . Pulmonic stenosis 05/25/2012  . Pure hyperglyceridemia 05/25/2012  . Hyperlipidemia with target LDL less than 100 09/20/2011  . OSA (obstructive sleep apnea) 09/20/2011  . BPH (benign prostatic hyperplasia) 09/20/2011  . Routine general medical examination at a health care facility 09/20/2011   Lyndee Hensen, PT, DPT 10:41 PM  06/08/18    [facility] [place], [place], [address] Phone: [phone]   Fax:  [phone]  Name: GLORIA LAMBERTSON MRN: [id number] Date of Birth: [birth year]

## 2018-06-08 NOTE — Patient Instructions (Addendum)
Good to see you  Keep working on the motion and keep working with the PT  We will add a couple labs  Stop melatonin  Start Tart cherry extract any dose at night Trazodone at night if needed as well  See me again in 6 weeks

## 2018-06-08 NOTE — Patient Instructions (Signed)
Muscle Cramps and Spasms Muscle cramps and spasms are when muscles tighten by themselves. They usually get better within minutes. Muscle cramps are painful. They are usually stronger and last longer than muscle spasms. Muscle spasms may or may not be painful. They can last a few seconds or much longer. Cramps and spasms can affect any muscle, but they occur most often in the calf muscles of the leg. They are usually not caused by a serious problem. In many cases, the cause is not known. Some common causes include:  Doing more physical work or exercise than your body is ready for.  Using the muscles too much (overuse) by repeating certain movements too many times.  Staying in a certain position for a long time.  Playing a sport or doing an activity without preparing properly.  Using bad form or technique while playing a sport or doing an activity.  Not having enough water in your body (dehydration).  Injury.  Side effects of some medicines.  Low levels of the salts and minerals in your blood (electrolytes), such as low potassium or calcium. Follow these instructions at home: Managing pain and stiffness      Massage, stretch, and relax the muscle. Do this for many minutes at a time.  If told, put heat on tight or tense muscles as often as told by your doctor. Use the heat source that your doctor recommends, such as a moist heat pack or a heating pad. ? Place a towel between your skin and the heat source. ? Leave the heat on for 20-30 minutes. ? Remove the heat if your skin turns bright red. This is very important if you are not able to feel pain, heat, or cold. You may have a greater risk of getting burned.  If told, put ice on the affected area. This may help if you are sore or have pain after a cramp or spasm. ? Put ice in a plastic bag. ? Place a towel between your skin and the bag. ? Leave the ice on for 20 minutes, 2-3 times a day.  Try taking hot showers or baths to help  relax tight muscles. Eating and drinking  Drink enough fluid to keep your pee (urine) pale yellow.  Eat a healthy diet to help ensure that your muscles work well. This should include: ? Fruits and vegetables. ? Lean protein. ? Whole grains. ? Low-fat or nonfat dairy products. General instructions  If you are having cramps often, avoid intense exercise for several days.  Take over-the-counter and prescription medicines only as told by your doctor.  Watch for any changes in your symptoms.  Keep all follow-up visits as told by your doctor. This is important. Contact a doctor if:  Your cramps or spasms get worse or happen more often.  Your cramps or spasms do not get better with time. Summary  Muscle cramps and spasms are when muscles tighten by themselves. They usually get better within minutes.  Cramps and spasms occur most often in the calf muscles of the leg.  Massage, stretch, and relax the muscle. This may help the cramp or spasm go away.  Drink enough fluid to keep your pee (urine) pale yellow. This information is not intended to replace advice given to you by your health care provider. Make sure you discuss any questions you have with your health care provider. Document Released: 04/15/2008 Document Revised: 09/26/2017 Document Reviewed: 09/26/2017 Elsevier Interactive Patient Education  2019 [employer]  

## 2018-06-08 NOTE — Progress Notes (Signed)
[name] [place], [place] [address] Phone: [phone] Subjective:    I Zachary Graham am serving as a Education administrator for Dr. Hulan Saas.   CC: Shoulder pain follow-up  [id number]  Zachary Graham is a 58 y.o. male coming in with complaint of shoulder pain.  Type I diabetic with frozen shoulder.  Been doing physical therapy now for over a month.  Patient states that he's not 100% but he is doing better.      Past Medical History:  Diagnosis Date  . Diabetes mellitus   . GERD (gastroesophageal reflux disease)   . Heart murmur   . Hyperlipidemia   . Hypertension   . OSA (obstructive sleep apnea)   . Pneumonia    Past Surgical History:  Procedure Laterality Date  . CERVICAL DISC ARTHROPLASTY N/A 03/16/2017   Procedure: CERVICAL FIVE- CERVICAL SIX [place] ARTHROPLASTY;  Surgeon: Jovita Gamma, MD;  Location: [facility];  Service: Neurosurgery;  Laterality: N/A;  CERVICAL 5- CERVICAL 6 DISC ARTHROPLASTY  . COLONOSCOPY    . TONSILLECTOMY     Social History   Socioeconomic History  . Marital status: Married    Spouse name: Not on file  . Number of children: 3  . Years of education: Not on file  . Highest education level: Not on file  Occupational History  . Occupation: RADIATION SAFETY OFFICER    Employer: [employer]  Social Needs  . Financial resource strain: Not on file  . Food insecurity:    Worry: Not on file    Inability: Not on file  . Transportation needs:    Medical: Not on file    Non-medical: Not on file  Tobacco Use  . Smoking status: Never Smoker  . Smokeless tobacco: Never Used  Substance and Sexual Activity  . Alcohol use: No    Alcohol/week: 0.0 standard drinks  . Drug use: No  . Sexual activity: Yes    Partners: Female  Lifestyle  . Physical activity:    Days per week: Not on file    Minutes per session: Not on file  . Stress: Not on file  Relationships  . Social connections:    Talks on phone: Not on  file    Gets together: Not on file    Attends religious service: Not on file    Active member of club or organization: Not on file    Attends meetings of clubs or organizations: Not on file    Relationship status: Not on file  Other Topics Concern  . Not on file  Social History Narrative   Regular exercise: runs 3 miles 3x week, takes one day off per week to rest   Caffeine use: stopped all diet soda on 04/23/15 per advice from Sports MD   Allergies  Allergen Reactions  . Lisinopril Swelling and Other (See Comments)    Extreme facial swelling causing hospitalization  . Shellfish Allergy Anaphylaxis  . Zetia [Ezetimibe] Other (See Comments)    ABDOMINAL CRAMPING  . Penicillins Other (See Comments)    UNSPECIFIED REACTION > Childhood allergy    . Cymbalta [Duloxetine Hcl] Other (See Comments)    Severe feet cramps   Family History  Problem Relation Age of Onset  . Colon cancer Maternal Grandfather   . Colon cancer Paternal Grandfather   . Heart disease Father        Valve replacement; pacemaker  . Alcohol abuse Neg Hx   . Asthma Neg  Hx   . Diabetes Neg Hx   . Hyperlipidemia Neg Hx   . Hypertension Neg Hx   . Kidney disease Neg Hx   . Stroke Neg Hx     Current Outpatient Medications (Endocrine & Metabolic):  .  glucagon (GLUCAGON EMERGENCY) 1 MG injection, Inject 1 mg into the muscle once as needed. .  insulin lispro (HUMALOG) 100 UNIT/ML injection, Inject into the skin 3 (three) times daily before meals. Insulin pump .  metFORMIN (GLUCOPHAGE) 500 MG tablet, TAKE 2 TABLETS BY MOUTH DAILY WITH SUPPER  Current Outpatient Medications (Cardiovascular):  .  losartan (COZAAR) 50 MG tablet, TAKE 1 TABLET BY MOUTH ONCE DAILY (Patient taking differently: TAKE 50 mg BY MOUTH ONCE DAILY) .  simvastatin (ZOCOR) 40 MG tablet, Take 1 tablet (40 mg total) by mouth daily.  Current Outpatient Medications (Respiratory):  Marland Kitchen  QNASL 80 MCG/ACT AERS, PLACE 4 PUFFS INTO THE NOSE DAILY.  (Patient taking differently: Place 2 puffs into the nose daily as needed (allergies). )  Current Outpatient Medications (Analgesics):  .  acetaminophen (TYLENOL) 325 MG tablet, Take 650 mg by mouth every 6 (six) hours as needed for mild pain. Marland Kitchen  aspirin EC 81 MG tablet, Take 81 mg by mouth daily.   Current Outpatient Medications (Other):  Marland Kitchen  Alpha-Lipoic Acid 600 MG CAPS, Take 1 capsule by mouth daily.  Marland Kitchen  amphetamine-dextroamphetamine (ADDERALL) 10 MG tablet, Take 1 tablet (10 mg total) by mouth 2 (two) times daily. Marland Kitchen  amphetamine-dextroamphetamine (ADDERALL) 5 MG tablet, Take 1 tablet (5 mg total) by mouth daily. .  Ascorbic Acid (VITAMIN C) 1000 MG tablet, Take 1,000 mg by mouth daily. Marland Kitchen  b complex vitamins tablet, Take 1 tablet by mouth daily. Marland Kitchen  BAYER MICROLET LANCETS lancets, USE TO TEST BLOOD SUGAR 6 TIMES A DAY .  Calcium-Magnesium-Vitamin D (CALCIUM 500 PO), Take 1 tablet by mouth daily.  .  Continuous Blood Gluc Sensor (FREESTYLE LIBRE 14 DAY SENSOR) MISC, 1 each by Does not apply route every 14 (fourteen) days. Change every 2 weeks .  glucose blood (CONTOUR NEXT TEST) test strip, USE AS INSTRUCTED TO CHECK SUGAR 6 TIMES DAILY .  Melatonin 5 MG CHEW, Chew 10 tablets by mouth at bedtime.  .  Misc Natural Products (TURMERIC CURCUMIN) CAPS, Take 1 capsule by mouth daily.  .  NON FORMULARY, CPAP .  pantoprazole (PROTONIX) 40 MG tablet, TAKE 40 mg BY MOUTH ONCE DAILY .  SILVADENE 1 % cream, APPLY 1 APPLICATION TOPICALLY DAILY. Marland Kitchen  Vitamin D, Ergocalciferol, (DRISDOL) 1.25 MG (50000 UT) CAPS capsule, Take 1 capsule (50,000 Units total) by mouth every 7 (seven) days.    Past medical history, social, surgical and family history all reviewed in electronic medical record.  No pertanent information unless stated regarding to the chief complaint.   Review of Systems:  No headache, visual changes, nausea, vomiting, diarrhea, constipation, dizziness, abdominal pain, skin rash, fevers, chills,  night sweats, weight loss, swollen lymph nodes, body aches, joint swelling, muscle aches, chest pain, shortness of breath, mood changes.   Objective  There were no vitals taken for this visit. Systems examined below as of    General: No apparent distress alert and oriented x3 mood and affect normal, dressed appropriately.  HEENT: Pupils equal, extraocular movements intact  Respiratory: Patient's speak in full sentences and does not appear short of breath  Cardiovascular: No lower extremity edema, non tender, no erythema  Skin: Warm dry intact with no signs of infection  or rash on extremities or on axial skeleton.  Abdomen: Soft nontender  Neuro: Cranial nerves II through XII are intact, neurovascularly intact in all extremities with 2+ DTRs and 2+ pulses.  Lymph: No lymphadenopathy of posterior or anterior cervical chain or axillae bilaterally.  Gait normal with good balance and coordination.  MSK:  Non tender with full range of motion and good stability and symmetric strength and tone of  elbows, wrist, hip, knee and ankles bilaterally.  Moderate arthritic changes of the neck with loss of lordosis  Patient does have limited range of motion of the left shoulder.  Good strength though noted.  Patient has forward flexion of 140 degrees, external rotation of 5 degrees, internal rotation to sacrum.  Contralateral shoulder unremarkable    Impression and Recommendations:      The above documentation has been reviewed and is accurate and complete [name], DO       Note: This dictation was prepared with Dragon dictation along with smaller phrase technology. Any transcriptional errors that result from this process are unintentional.

## 2018-06-08 NOTE — Progress Notes (Signed)
Subjective:  Patient ID: Zachary Graham, male    DOB: 19-Dec-1960  Age: 58 y.o. MRN: [id number]  CC: Diabetes   HPI Zachary Graham presents for f/up - Since I last saw him he has developed nocturnal cramps in his legs.  He denies claudication.  During the day he does not experience muscle or joint aches.  He continues to struggle with his focus and attention and requests a refill of Adderall.  Outpatient Medications Prior to Visit  Medication Sig Dispense Refill  . acetaminophen (TYLENOL) 325 MG tablet Take 650 mg by mouth every 6 (six) hours as needed for mild pain.    . Alpha-Lipoic Acid 600 MG CAPS Take 1 capsule by mouth daily.     Marland Kitchen amphetamine-dextroamphetamine (ADDERALL) 5 MG tablet Take 1 tablet (5 mg total) by mouth daily. 90 tablet 0  . Ascorbic Acid (VITAMIN C) 1000 MG tablet Take 1,000 mg by mouth daily.    Marland Kitchen aspirin EC 81 MG tablet Take 81 mg by mouth daily.    Marland Kitchen b complex vitamins tablet Take 1 tablet by mouth daily.    Marland Kitchen BAYER MICROLET LANCETS lancets USE TO TEST BLOOD SUGAR 6 TIMES A DAY 300 each 11  . Calcium-Magnesium-Vitamin D (CALCIUM 500 PO) Take 1 tablet by mouth daily.     . Continuous Blood Gluc Sensor (FREESTYLE LIBRE 14 DAY SENSOR) MISC 1 each by Does not apply route every 14 (fourteen) days. Change every 2 weeks 2 each 11  . glucagon (GLUCAGON EMERGENCY) 1 MG injection Inject 1 mg into the muscle once as needed. 2 each prn  . glucose blood (CONTOUR NEXT TEST) test strip USE AS INSTRUCTED TO CHECK SUGAR 6 TIMES DAILY 400 each 5  . insulin lispro (HUMALOG) 100 UNIT/ML injection Inject into the skin 3 (three) times daily before meals. Insulin pump    . losartan (COZAAR) 50 MG tablet TAKE 1 TABLET BY MOUTH ONCE DAILY (Patient taking differently: TAKE 50 mg BY MOUTH ONCE DAILY) 90 tablet 1  . Melatonin 5 MG CHEW Chew 10 tablets by mouth at bedtime.     . metFORMIN (GLUCOPHAGE) 500 MG tablet TAKE 2 TABLETS BY MOUTH DAILY WITH SUPPER 180 tablet 1  . Misc Natural Products  (TURMERIC CURCUMIN) CAPS Take 1 capsule by mouth daily.     . NON FORMULARY CPAP    . pantoprazole (PROTONIX) 40 MG tablet TAKE 40 mg BY MOUTH ONCE DAILY 90 tablet 1  . QNASL 80 MCG/ACT AERS PLACE 4 PUFFS INTO THE NOSE DAILY. (Patient taking differently: Place 2 puffs into the nose daily as needed (allergies). ) 8.7 g 5  . SILVADENE 1 % cream APPLY 1 APPLICATION TOPICALLY DAILY. 50 g 0  . simvastatin (ZOCOR) 40 MG tablet Take 1 tablet (40 mg total) by mouth daily. 90 tablet 3  . Vitamin D, Ergocalciferol, (DRISDOL) 1.25 MG (50000 UT) CAPS capsule Take 1 capsule (50,000 Units total) by mouth every 7 (seven) days. 12 capsule 0  . amphetamine-dextroamphetamine (ADDERALL) 10 MG tablet Take 1 tablet (10 mg total) by mouth 2 (two) times daily. 180 tablet 0   No facility-administered medications prior to visit.     ROS Review of Systems  Constitutional: Negative for diaphoresis, fatigue and unexpected weight change.  HENT: Negative.   Eyes: Negative for visual disturbance.  Respiratory: Negative for cough, shortness of breath and wheezing.   Cardiovascular: Negative for palpitations and leg swelling.  Gastrointestinal: Negative for abdominal pain, constipation, diarrhea, nausea and  vomiting.  Endocrine: Negative.   Genitourinary: Negative.  Negative for difficulty urinating.  Musculoskeletal: Positive for myalgias. Negative for arthralgias.  Skin: Negative.  Negative for color change, pallor and rash.  Neurological: Negative.  Negative for dizziness, weakness, light-headedness, numbness and headaches.  Hematological: Negative for adenopathy. Does not bruise/bleed easily.  Psychiatric/Behavioral: Negative.     Objective:  BP 122/70 (BP Location: Left Arm, Patient Position: Sitting, Cuff Size: Large)   Pulse 72   Temp (!) 97.3 F (36.3 C) (Oral)   Ht 5\' 10"  (1.778 m)   Wt 231 lb (104.8 kg)   SpO2 98%   BMI 33.15 kg/m   BP Readings from Last 3 Encounters:  06/08/18 122/70  05/02/18  140/70  04/25/18 130/70    Wt Readings from Last 3 Encounters:  06/08/18 231 lb (104.8 kg)  05/02/18 227 lb (103 kg)  04/25/18 224 lb (101.6 kg)    Physical Exam Vitals signs reviewed.  Constitutional:      Appearance: He is not ill-appearing or diaphoretic.  HENT:     Nose: Nose normal. No congestion or rhinorrhea.     Mouth/Throat:     Mouth: Mucous membranes are moist.     Pharynx: Oropharynx is clear. No oropharyngeal exudate or posterior oropharyngeal erythema.  Eyes:     General: No scleral icterus.    Extraocular Movements: Extraocular movements intact.     Conjunctiva/sclera: Conjunctivae normal.     Pupils: Pupils are equal, round, and reactive to light.  Neck:     Musculoskeletal: Normal range of motion and neck supple.  Cardiovascular:     Rate and Rhythm: Normal rate and regular rhythm.     Heart sounds: No murmur. No gallop.   Pulmonary:     Effort: Pulmonary effort is normal.     Breath sounds: No stridor. No wheezing, rhonchi or rales.  Abdominal:     Palpations: There is no hepatomegaly, splenomegaly or mass.     Tenderness: There is no abdominal tenderness. There is no guarding.  Musculoskeletal: Normal range of motion.        General: No swelling.     Right lower leg: No edema.     Left lower leg: No edema.  Lymphadenopathy:     Cervical: No cervical adenopathy.  Skin:    General: Skin is warm and dry.     Findings: No erythema or rash.  Neurological:     General: No focal deficit present.     Mental Status: He is oriented to person, place, and time. Mental status is at baseline.     Lab Results  Component Value Date   WBC 8.0 06/08/2018   HGB 13.8 06/08/2018   HCT 40.4 06/08/2018   PLT 224.0 06/08/2018   GLUCOSE 196 (H) 06/08/2018   CHOL 89 10/03/2017   TRIG 109.0 10/03/2017   HDL 34.10 (L) 10/03/2017   LDLDIRECT 87.0 12/02/2016   LDLCALC 33 10/03/2017   ALT 21 06/08/2018   AST 14 06/08/2018   NA 137 06/08/2018   K 4.1 06/08/2018    CL 102 06/08/2018   CREATININE 0.75 06/08/2018   BUN 16 06/08/2018   CO2 24 06/08/2018   TSH 2.83 06/08/2018   PSA 0.37 10/03/2017   HGBA1C 7.7 (A) 05/02/2018   MICROALBUR <0.7 10/03/2017    No results found.  Assessment & Plan:   Zachary Graham was seen today for diabetes.  Diagnoses and all orders for this visit:  Nocturnal muscle cramps- His labs are negative  for myopathy or myositis.  His magnesium level is in the low normal range so will try a magnesium supplement at bedtime to see if this helps with the nocturnal cramps. -     CBC with Differential/Platelet; Future -     CK; Future -     Comprehensive metabolic panel; Future -     Magnesium; Future -     Sedimentation rate; Future -     Phosphorus; Future -     Magnesium Oxide 400 MG CAPS; Take 1 capsule (400 mg total) by mouth daily.  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   I am having Zachary Graham start on Magnesium Oxide. I am also having him maintain his simvastatin, Melatonin, Turmeric Curcumin, Calcium-Magnesium-Vitamin D (CALCIUM 500 PO), vitamin C, Alpha-Lipoic Acid, b complex vitamins, losartan, QNASL, HUMALOG, acetaminophen, aspirin EC, NON FORMULARY, BAYER MICROLET LANCETS, glucagon, FREESTYLE LIBRE 14 DAY SENSOR, SILVADENE, pantoprazole, Vitamin D (Ergocalciferol), amphetamine-dextroamphetamine, glucose blood, metFORMIN, and amphetamine-dextroamphetamine.  Meds ordered this encounter  Medications  . Magnesium Oxide 400 MG CAPS    Sig: Take 1  capsule (400 mg total) by mouth daily.    Dispense:  90 capsule    Refill:  1  . amphetamine-dextroamphetamine (ADDERALL) 10 MG tablet    Sig: Take 1 tablet (10 mg total) by mouth 2 (two) times daily.    Dispense:  180 tablet    Refill:  0     Follow-up: Return in about 6 months (around 12/07/2018).  Scarlette Calico, MD

## 2018-06-09 MED ORDER — AMPHETAMINE-DEXTROAMPHETAMINE 10 MG PO TABS: 10.0000 mg | ORAL_TABLET | Freq: Two times a day (BID) | ORAL | 0 refills | Status: DC

## 2018-06-09 MED FILL — CONTOUR NEXT STRIPS: 66 days supply | Qty: 400 | Fill #5

## 2018-06-09 MED FILL — DEXTROAMP-AMPHETAMINE 5 MG: 5 | 90 days supply | Qty: 90 | Fill #0

## 2018-06-11 LAB — VITAMIN D 1,25 DIHYDROXY
VITAMIN D 1, 25 (OH) TOTAL: 20 pg/mL (ref 18–72)
VITAMIN D3 1, 25 (OH): 20 pg/mL
Vitamin D2 1, 25 (OH)2: <8 pg/mL

## 2018-06-20 ENCOUNTER — Encounter: Payer: Self-pay | Admitting: Physical Therapy

## 2018-06-20 ENCOUNTER — Ambulatory Visit (INDEPENDENT_AMBULATORY_CARE_PROVIDER_SITE_OTHER): Payer: 59 | Admitting: Physical Therapy

## 2018-06-20 DIAGNOSIS — M25612 Stiffness of left shoulder, not elsewhere classified: Secondary | ICD-10-CM | POA: Diagnosis not present

## 2018-06-20 DIAGNOSIS — M25512 Pain in left shoulder: Secondary | ICD-10-CM

## 2018-06-22 ENCOUNTER — Encounter: Payer: Self-pay | Admitting: Physical Therapy

## 2018-06-22 ENCOUNTER — Ambulatory Visit (INDEPENDENT_AMBULATORY_CARE_PROVIDER_SITE_OTHER): Payer: 59 | Admitting: Physical Therapy

## 2018-06-22 DIAGNOSIS — M25512 Pain in left shoulder: Secondary | ICD-10-CM | POA: Diagnosis not present

## 2018-06-22 DIAGNOSIS — M25612 Stiffness of left shoulder, not elsewhere classified: Secondary | ICD-10-CM | POA: Diagnosis not present

## 2018-06-22 NOTE — Therapy (Signed)
[facility] [address] [place], [place], [address] Phone: [phone]   Fax:  [phone]  Physical Therapy Treatment  Patient Details  Name: Zachary Graham MRN: [id number] Date of Birth: [birth year] Referring Provider (PT): [name]   Encounter Date: 06/22/2018  PT End of Session - 06/22/18 0929    Visit Number  16    Number of Visits  24    Date for PT Re-Evaluation  07/06/18    Authorization Type  [facility]    PT Start Time  0802    PT Stop Time  0844    PT Time Calculation (min)  42 min    Activity Tolerance  Patient tolerated treatment well    Behavior During Therapy  [facility] for tasks assessed/performed       Past Medical History:  Diagnosis Date  . Diabetes mellitus   . GERD (gastroesophageal reflux disease)   . Heart murmur   . Hyperlipidemia   . Hypertension   . OSA (obstructive sleep apnea)   . Pneumonia     Past Surgical History:  Procedure Laterality Date  . CERVICAL DISC ARTHROPLASTY N/A 03/16/2017   Procedure: CERVICAL FIVE- CERVICAL SIX [place] ARTHROPLASTY;  Surgeon: Jovita Gamma, MD;  Location: [facility];  Service: Neurosurgery;  Laterality: N/A;  CERVICAL 5- CERVICAL 6 DISC ARTHROPLASTY  . COLONOSCOPY    . TONSILLECTOMY      There were no vitals filed for this visit.  Subjective Assessment - 06/22/18 0926    Subjective  Pt continues to state pain at night that limits his sleeeping. Pain is improving during the day.     Pain Score  3     Pain Orientation  Left    Pain Descriptors / Indicators  Aching    Pain Type  Acute pain    Pain Onset  More than a month ago    Pain Frequency  Intermittent                       [facility] Adult PT Treatment/Exercise - 06/22/18 0810      Shoulder Exercises: Supine   Protraction  20 reps    Protraction Limitations  SA punch    External Rotation  20 reps    External Rotation Limitations  cane    Flexion  20 reps;AAROM    Flexion Limitations  cane    Other Supine Exercises  ER butterfly stretch x15;     Other Supine Exercises  --      Shoulder Exercises: Standing   Flexion  --    ABduction  --    Other Standing Exercises  Wall slides with 1 UE, x20; wall walk on ladder for Abd x15    Other Standing Exercises  Ext with stick x15; IR behind back with stick x15;  2 hands behind back IR 10 sec x 10;  Wall push ups x20; Wall SA punch x20;       Shoulder Exercises: Pulleys   Flexion  --      Shoulder Exercises: Stretch   Corner Stretch  3 reps;30 seconds    Internal Rotation Stretch  --    Internal Rotation Stretch Limitations  --      Modalities   Modalities  Ultrasound      Cryotherapy   Cryotherapy Location  Shoulder      Electrical Stimulation   Electrical Stimulation Location  L shoulder      Ultrasound   Ultrasound  Goals  Pain      Manual Therapy   Manual therapy comments  --    Joint Mobilization  L GHJ mobs, all motions, gr 2-3 ;     Passive ROM  L shoulder, all motions;                PT Short Term Goals - 05/09/18 1004      PT SHORT TERM GOAL #1   Title  Pt to be independent with initial HEP     Time  2    Period  Weeks    Status  Achieved        PT Long Term Goals - 06/08/18 2238      PT LONG TERM GOAL #1   Title  Pt to demo L shoulder AROm, to be WNL, to improve ability for reaching, lifting, and IADLs.     Time  6    Period  Weeks    Status  On-going    Target Date  07/06/18      PT LONG TERM GOAL #2   Title  Pt to demo improved strength of L shoulder, to be at least 4+/5 to improve ability for IADLs and work duties.     Time  6    Period  Weeks    Status  Partially Met    Target Date  07/06/18      PT LONG TERM GOAL #3   Title  Pt to report decreased pain in L shoulder to be 0-2/10 with activity     Time  6    Status  Partially Met    Target Date  07/06/18            Plan - 06/22/18 0930    Clinical Impression Statement  Pt continues to have signficant soreness with all  motions and activities. He has increased pain with manual therapy and PROM. Pt with most difficulty with IR/ER and behind the back motions. Will benefit from continued care.     Rehab Potential  Good    PT Frequency  2x / week    PT Duration  4 weeks    PT Treatment/Interventions  ADLs/Self Care Home Management;Cryotherapy;Electrical Stimulation;Iontophoresis 24m/ml Dexamethasone;Moist Heat;Therapeutic activities;Functional mobility training;Ultrasound;Therapeutic exercise;Neuromuscular re-education;Patient/family education;Dry needling;Passive range of motion;Manual techniques;Taping;Joint Manipulations    Consulted and Agree with Plan of Care  Patient       Patient will benefit from skilled therapeutic intervention in order to improve the following deficits and impairments:  Decreased endurance, Hypomobility, Decreased activity tolerance, Decreased strength, Impaired UE functional use, Pain, Increased muscle spasms, Decreased mobility, Decreased range of motion, Improper body mechanics, Impaired flexibility  Visit Diagnosis: Acute pain of left shoulder  Stiffness of left shoulder, not elsewhere classified     Problem List Patient Active Problem List   Diagnosis Date Noted  . Nocturnal muscle cramps 06/08/2018  . 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 HNP (herniated nucleus pulposus), cervical 03/16/2017  . 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 Herniation of cervical intervertebral disc with radiculopathy 01/04/2017  . Current mild episode of major depressive disorder without prior episode ([place] 12/02/2016  . Diabetic neuropathy ([facility] 03/01/2016  . Obesity, Class II, BMI 35.0-39.9, with comorbidity (see actual BMI) 04/24/2015  . Type 1 diabetes mellitus with hyperglycemia, with long-term current use of insulin ([facility] [place] 04/04/2015  . Patellofemoral stress syndrome of left knee  03/19/2015  .  Allergic rhinitis due to pollen 11/14/2014  . Equinus deformity of foot, acquired 05/29/2014  . ADHD (attention deficit hyperactivity disorder), inattentive type 10/24/2013  . Vitamin D deficiency 02/28/2013  . Pulmonic stenosis 05/25/2012  . Pure hyperglyceridemia 05/25/2012  . Hyperlipidemia with target LDL less than 100 09/20/2011  . OSA (obstructive sleep apnea) 09/20/2011  . BPH (benign prostatic hyperplasia) 09/20/2011  . Routine general medical examination at a health care facility 09/20/2011    Lyndee Hensen, PT, DPT 9:36 AM  06/22/18    [facility], [place], [address] Phone: [phone]   Fax:  [phone]  Name: Zachary Graham MRN: [id number] Date of Birth: 03-29-1961

## 2018-06-22 NOTE — Therapy (Signed)
[facility] [address] [place], [place], [address] Phone: [phone]   Fax:  [phone]  Physical Therapy Treatment  Patient Details  Name: Zachary Graham MRN: [id number] Date of Birth: [birth year] Referring Provider (PT): [name]   Encounter Date: 06/20/2018  PT End of Session - 06/22/18 1132    Visit Number  15    Number of Visits  24    Date for PT Re-Evaluation  07/06/18    Authorization Type  [facility]    PT Start Time  0848    PT Stop Time  0932    PT Time Calculation (min)  44 min    Activity Tolerance  Patient tolerated treatment well    Behavior During Therapy  [facility] for tasks assessed/performed       Past Medical History:  Diagnosis Date  . Diabetes mellitus   . GERD (gastroesophageal reflux disease)   . Heart murmur   . Hyperlipidemia   . Hypertension   . OSA (obstructive sleep apnea)   . Pneumonia     Past Surgical History:  Procedure Laterality Date  . CERVICAL DISC ARTHROPLASTY N/A 03/16/2017   Procedure: CERVICAL FIVE- CERVICAL SIX [place] ARTHROPLASTY;  Surgeon: Jovita Gamma, MD;  Location: [facility];  Service: Neurosurgery;  Laterality: N/A;  CERVICAL 5- CERVICAL 6 DISC ARTHROPLASTY  . COLONOSCOPY    . TONSILLECTOMY      There were no vitals filed for this visit.  Subjective Assessment - 06/22/18 1131    Subjective  Pt states very minimal/No pain during the day, with regular motions, reaching, work etc. He is still having significant difficulty with sleeping, due to pain. Still having difficulty with behind back motions.     Currently in Pain?  Yes    Pain Score  2     Pain Location  Shoulder    Pain Orientation  Left    Pain Descriptors / Indicators  Aching    Pain Type  Acute pain    Pain Onset  More than a month ago    Pain Frequency  Intermittent                       [facility] Adult PT Treatment/Exercise - 06/22/18 1134      Shoulder Exercises: Supine   Protraction  20 reps    Protraction Limitations  SA punch    External Rotation  20 reps    External Rotation Limitations  cane    Flexion  20 reps;AAROM    Flexion Limitations  cane    Other Supine Exercises  ER butterfly stretch x15;     Other Supine Exercises  ER at 90 deg AROM x15;       Shoulder Exercises: Standing   Flexion  10 reps;AROM    ABduction  10 reps;AROM    Other Standing Exercises  Ext with stick x15; IR behind back with stick x15;  2 hands behind back IR 10 sec x 10;       Shoulder Exercises: Pulleys   Flexion  2 minutes      Shoulder Exercises: Stretch   Internal Rotation Stretch  5 reps    Internal Rotation Stretch Limitations  behind back /stick      Modalities   Modalities  Ultrasound      Cryotherapy   Cryotherapy Location  Shoulder      Electrical Stimulation   Electrical Stimulation Location  L shoulder  Ultrasound   Ultrasound Goals  Pain      Manual Therapy   Joint Mobilization  L GHJ mobs, all motions, gr 2-3 ;     Passive ROM  L shoulder, all motions;                PT Short Term Goals - 05/09/18 1004      PT SHORT TERM GOAL #1   Title  Pt to be independent with initial HEP     Time  2    Period  Weeks    Status  Achieved        PT Long Term Goals - 06/08/18 2238      PT LONG TERM GOAL #1   Title  Pt to demo L shoulder AROm, to be WNL, to improve ability for reaching, lifting, and IADLs.     Time  6    Period  Weeks    Status  On-going    Target Date  07/06/18      PT LONG TERM GOAL #2   Title  Pt to demo improved strength of L shoulder, to be at least 4+/5 to improve ability for IADLs and work duties.     Time  6    Period  Weeks    Status  Partially Met    Target Date  07/06/18      PT LONG TERM GOAL #3   Title  Pt to report decreased pain in L shoulder to be 0-2/10 with activity     Time  6    Status  Partially Met    Target Date  07/06/18            Plan - 06/22/18 1133    Clinical Impression Statement  Pt with  increased stiffness today, since last session. He has significant discomfort with PROM, especially for ER. Recommended pt continue light, frequenty movement and HEP daily. Pt making slow progress with rotation, elevation motions for AROM improving.     Rehab Potential  Good    PT Frequency  2x / week    PT Duration  4 weeks    PT Treatment/Interventions  ADLs/Self Care Home Management;Cryotherapy;Electrical Stimulation;Iontophoresis 79m/ml Dexamethasone;Moist Heat;Therapeutic activities;Functional mobility training;Ultrasound;Therapeutic exercise;Neuromuscular re-education;Patient/family education;Dry needling;Passive range of motion;Manual techniques;Taping;Joint Manipulations    Consulted and Agree with Plan of Care  Patient       Patient will benefit from skilled therapeutic intervention in order to improve the following deficits and impairments:  Decreased endurance, Hypomobility, Decreased activity tolerance, Decreased strength, Impaired UE functional use, Pain, Increased muscle spasms, Decreased mobility, Decreased range of motion, Improper body mechanics, Impaired flexibility  Visit Diagnosis: Acute pain of left shoulder  Stiffness of left shoulder, not elsewhere classified     Problem List Patient Active Problem List   Diagnosis Date Noted  . Nocturnal muscle cramps 06/08/2018  . 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 HNP (herniated nucleus pulposus), cervical 03/16/2017  . 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 ([facility] 01/06/2017  . Herniation of cervical intervertebral disc with radiculopathy 01/04/2017  . Current mild episode of major depressive disorder without prior episode ([facility] 12/02/2016  . Diabetic neuropathy ([facility] 03/01/2016  . Obesity, Class II, BMI 35.0-39.9, with comorbidity (see actual BMI) 04/24/2015  . Type 1 diabetes mellitus with hyperglycemia, with long-term current use  of insulin ([facility] 04/04/2015  . Patellofemoral stress syndrome of left knee 03/19/2015  . Allergic rhinitis due  to pollen 11/14/2014  . Equinus deformity of foot, acquired 05/29/2014  . ADHD (attention deficit hyperactivity disorder), inattentive type 10/24/2013  . Vitamin D deficiency 02/28/2013  . Pulmonic stenosis 05/25/2012  . Pure hyperglyceridemia 05/25/2012  . Hyperlipidemia with target LDL less than 100 09/20/2011  . OSA (obstructive sleep apnea) 09/20/2011  . BPH (benign prostatic hyperplasia) 09/20/2011  . Routine general medical examination at a health care facility 09/20/2011    Lyndee Hensen, PT, DPT 11:35 AM  06/22/18    [facility] [place], [place], [address] Phone: [phone]   Fax:  [phone]  Name: CORREY WEIDNER MRN: [id number] Date of Birth: Aug 27, 1960

## 2018-06-26 ENCOUNTER — Encounter: Payer: Self-pay | Admitting: Physical Therapy

## 2018-06-26 ENCOUNTER — Ambulatory Visit (INDEPENDENT_AMBULATORY_CARE_PROVIDER_SITE_OTHER): Payer: 59 | Admitting: Physical Therapy

## 2018-06-26 DIAGNOSIS — M25512 Pain in left shoulder: Secondary | ICD-10-CM | POA: Diagnosis not present

## 2018-06-26 DIAGNOSIS — M25612 Stiffness of left shoulder, not elsewhere classified: Secondary | ICD-10-CM | POA: Diagnosis not present

## 2018-06-26 NOTE — Therapy (Signed)
[facility] [address] [place], [place], [address] Phone: [phone]   Fax:  [phone]  Physical Therapy Treatment  Patient Details  Name: Zachary Graham MRN: [id number] Date of Birth: February 06, 1961 Referring Provider (PT): [name]   Encounter Date: 06/26/2018  PT End of Session - 06/26/18 0816    Visit Number  16    Number of Visits  24    Date for PT Re-Evaluation  07/06/18    Authorization Type  [facility]    PT Start Time  0810    PT Stop Time  0848    PT Time Calculation (min)  38 min    Activity Tolerance  Patient tolerated treatment well    Behavior During Therapy  [facility] for tasks assessed/performed       Past Medical History:  Diagnosis Date  . Diabetes mellitus   . GERD (gastroesophageal reflux disease)   . Heart murmur   . Hyperlipidemia   . Hypertension   . OSA (obstructive sleep apnea)   . Pneumonia     Past Surgical History:  Procedure Laterality Date  . CERVICAL DISC ARTHROPLASTY N/A 03/16/2017   Procedure: CERVICAL FIVE- CERVICAL SIX [place] ARTHROPLASTY;  Surgeon: Jovita Gamma, MD;  Location: [facility];  Service: Neurosurgery;  Laterality: N/A;  CERVICAL 5- CERVICAL 6 DISC ARTHROPLASTY  . COLONOSCOPY    . TONSILLECTOMY      There were no vitals filed for this visit.  Subjective Assessment - 06/26/18 0811    Subjective  Pt states pain with exercises and pain with sleeping. Pt 10 min late to appt today.     Currently in Pain?  Yes    Pain Score  3     Pain Location  Shoulder    Pain Orientation  Left    Pain Descriptors / Indicators  Aching    Pain Type  Acute pain    Pain Onset  More than a month ago    Pain Frequency  Intermittent                       [facility] Adult PT Treatment/Exercise - 06/26/18 0825      Shoulder Exercises: Supine   Protraction  20 reps    Protraction Limitations  SA punch    External Rotation  20 reps    External Rotation Limitations  cane    Flexion  20  reps;AAROM    Flexion Limitations  cane 2lb    Other Supine Exercises  --    Other Supine Exercises  ER at 90 deg AROM x15;       Shoulder Exercises: Standing   Flexion  --    ABduction  --    Other Standing Exercises  Wall slides with 1 UE, x20; wall walk on ladder for Abd x15    Other Standing Exercises  Ext with stick x15; IR behind back with stick x15;  2 hands behind back IR 10 sec x 10;       Shoulder Exercises: Pulleys   Flexion  --      Shoulder Exercises: Stretch   Corner Stretch  --    Internal Rotation Stretch  --   10 reps   Internal Rotation Stretch Limitations  behind back /stick    Other Shoulder Stretches  --      Modalities   Modalities  Ultrasound      Cryotherapy   Cryotherapy Location  Shoulder  Acupuncturist Location  L shoulder      Manual Therapy   Joint Mobilization  L GHJ mobs, all motions, gr 2-3 ;     Passive ROM  L shoulder, all motions;                PT Short Term Goals - 05/09/18 1004      PT SHORT TERM GOAL #1   Title  Pt to be independent with initial HEP     Time  2    Period  Weeks    Status  Achieved        PT Long Term Goals - 06/08/18 2238      PT LONG TERM GOAL #1   Title  Pt to demo L shoulder AROm, to be WNL, to improve ability for reaching, lifting, and IADLs.     Time  6    Period  Weeks    Status  On-going    Target Date  07/06/18      PT LONG TERM GOAL #2   Title  Pt to demo improved strength of L shoulder, to be at least 4+/5 to improve ability for IADLs and work duties.     Time  6    Period  Weeks    Status  Partially Met    Target Date  07/06/18      PT LONG TERM GOAL #3   Title  Pt to report decreased pain in L shoulder to be 0-2/10 with activity     Time  6    Status  Partially Met    Target Date  07/06/18            Plan - 06/26/18 1623    Clinical Impression Statement  Pt with soreness with all motions for PROM today. He has difficulty  relaxing for PROM due to pain. Pt with improving pain overall, but continues to be very limited with ROM, especially for ER. Pt encouraged to perform ROM frequently thorughout the day. Pt to be seen 1x/wk until next MD follow up.     Rehab Potential  Good    PT Frequency  2x / week    PT Duration  4 weeks    PT Treatment/Interventions  ADLs/Self Care Home Management;Cryotherapy;Electrical Stimulation;Iontophoresis 36m/ml Dexamethasone;Moist Heat;Therapeutic activities;Functional mobility training;Ultrasound;Therapeutic exercise;Neuromuscular re-education;Patient/family education;Dry needling;Passive range of motion;Manual techniques;Taping;Joint Manipulations    Consulted and Agree with Plan of Care  Patient       Patient will benefit from skilled therapeutic intervention in order to improve the following deficits and impairments:  Decreased endurance, Hypomobility, Decreased activity tolerance, Decreased strength, Impaired UE functional use, Pain, Increased muscle spasms, Decreased mobility, Decreased range of motion, Improper body mechanics, Impaired flexibility  Visit Diagnosis: Acute pain of left shoulder  Stiffness of left shoulder, not elsewhere classified     Problem List Patient Active Problem List   Diagnosis Date Noted  . Nocturnal muscle cramps 06/08/2018  . 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 HNP (herniated nucleus pulposus), cervical 03/16/2017  . 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 ([facility] 01/06/2017  . Herniation of cervical intervertebral disc with radiculopathy 01/04/2017  . Current mild episode of major depressive disorder without prior episode ([facility] 12/02/2016  . Diabetic neuropathy ([facility] 03/01/2016  . Obesity, Class II, BMI 35.0-39.9, with comorbidity (see actual BMI) 04/24/2015  . Type 1 diabetes mellitus with hyperglycemia, with long-term current use  of insulin ([place])  04/04/2015  . Patellofemoral stress syndrome of left knee 03/19/2015  . Allergic rhinitis due to pollen 11/14/2014  . Equinus deformity of foot, acquired 05/29/2014  . ADHD (attention deficit hyperactivity disorder), inattentive type 10/24/2013  . Vitamin D deficiency 02/28/2013  . Pulmonic stenosis 05/25/2012  . Pure hyperglyceridemia 05/25/2012  . Hyperlipidemia with target LDL less than 100 09/20/2011  . OSA (obstructive sleep apnea) 09/20/2011  . BPH (benign prostatic hyperplasia) 09/20/2011  . Routine general medical examination at a health care facility 09/20/2011    Lyndee Hensen, PT, DPT 4:25 PM  06/26/18    [facility] [place], [place], [address] Phone: [phone]   Fax:  [phone]  Name: Zachary Graham MRN: [id number] Date of Birth: 04-30-1961

## 2018-07-03 ENCOUNTER — Encounter: Payer: 59 | Admitting: Physical Therapy

## 2018-07-05 ENCOUNTER — Ambulatory Visit (INDEPENDENT_AMBULATORY_CARE_PROVIDER_SITE_OTHER): Payer: 59 | Admitting: Physical Therapy

## 2018-07-05 ENCOUNTER — Encounter: Payer: Self-pay | Admitting: Physical Therapy

## 2018-07-05 DIAGNOSIS — M25512 Pain in left shoulder: Secondary | ICD-10-CM

## 2018-07-05 DIAGNOSIS — M25612 Stiffness of left shoulder, not elsewhere classified: Secondary | ICD-10-CM | POA: Diagnosis not present

## 2018-07-05 NOTE — Therapy (Signed)
[facility] [address] [place], [place], [address] Phone: [phone]   Fax:  [phone]  Physical Therapy Treatment  Patient Details  Name: Zachary Graham MRN: [id number] Date of Birth: 05-08-1961 Referring Provider (PT): [name]   Encounter Date: 07/05/2018  PT End of Session - 07/05/18 1128    Visit Number  17    Number of Visits  24    Date for PT Re-Evaluation  07/06/18    Authorization Type  [facility]    PT Start Time  0802    PT Stop Time  0844    PT Time Calculation (min)  42 min    Activity Tolerance  Patient tolerated treatment well    Behavior During Therapy  [facility] for tasks assessed/performed       Past Medical History:  Diagnosis Date  . Diabetes mellitus   . GERD (gastroesophageal reflux disease)   . Heart murmur   . Hyperlipidemia   . Hypertension   . OSA (obstructive sleep apnea)   . Pneumonia     Past Surgical History:  Procedure Laterality Date  . CERVICAL DISC ARTHROPLASTY N/A 03/16/2017   Procedure: CERVICAL FIVE- CERVICAL SIX [place] ARTHROPLASTY;  Surgeon: Jovita Gamma, MD;  Location: [facility];  Service: Neurosurgery;  Laterality: N/A;  CERVICAL 5- CERVICAL 6 DISC ARTHROPLASTY  . COLONOSCOPY    . TONSILLECTOMY      There were no vitals filed for this visit.  Subjective Assessment - 07/05/18 1127    Subjective  Pt states improving pain and mobility, but still feels much limitation. Sleeping better with new meds, but still has hard time getting arm comfortable at night.     Limitations  Lifting;Writing;House hold activities    Patient Stated Goals  decreased pain , improved movement    Currently in Pain?  Yes    Pain Score  3     Pain Location  Shoulder    Pain Orientation  Left    Pain Descriptors / Indicators  Aching    Pain Type  Acute pain    Pain Onset  More than a month ago    Pain Frequency  Intermittent    Aggravating Factors   sleeping, reaching out to side, behind back.     Pain  Relieving Factors  heat , meds, rest.                        [facility] Adult PT Treatment/Exercise - 07/05/18 0001      Shoulder Exercises: Supine   Protraction  --    Protraction Limitations  --    External Rotation  20 reps    External Rotation Limitations  cane    Flexion  20 reps;AAROM    Flexion Limitations  cane 2lb    Other Supine Exercises  --      Shoulder Exercises: Standing   Flexion  10 reps;AROM    Flexion Limitations  Full/ 2 lb    ABduction  10 reps;AROM    ABduction Limitations  full/ 2 lb    Other Standing Exercises  Wall slides with 1 UE, x20; wall circles x20 each;     Other Standing Exercises  Ext with stick x15; IR behind back with stick x15;  2 hands behind back IR 10 sec x 10;       Shoulder Exercises: Pulleys   Flexion  2 minutes    ABduction  1 minute  Shoulder Exercises: Stretch   Internal Rotation Stretch  --    Internal Rotation Stretch Limitations  --      Modalities   Modalities  Ultrasound      Cryotherapy   Cryotherapy Location  Shoulder      Electrical Stimulation   Electrical Stimulation Location  L shoulder      Manual Therapy   Joint Mobilization  L GHJ mobs, all motions, gr 2-3 ; scapular mobs all motions.     Passive ROM  L shoulder, all motions;              PT Education - 07/05/18 1128    Education Details  HEP review    Person(s) Educated  Patient    Methods  Explanation    Comprehension  Verbalized understanding       PT Short Term Goals - 05/09/18 1004      PT SHORT TERM GOAL #1   Title  Pt to be independent with initial HEP     Time  2    Period  Weeks    Status  Achieved        PT Long Term Goals - 06/08/18 2238      PT LONG TERM GOAL #1   Title  Pt to demo L shoulder AROm, to be WNL, to improve ability for reaching, lifting, and IADLs.     Time  6    Period  Weeks    Status  On-going    Target Date  07/06/18      PT LONG TERM GOAL #2   Title  Pt to demo improved strength of L  shoulder, to be at least 4+/5 to improve ability for IADLs and work duties.     Time  6    Period  Weeks    Status  Partially Met    Target Date  07/06/18      PT LONG TERM GOAL #3   Title  Pt to report decreased pain in L shoulder to be 0-2/10 with activity     Time  6    Status  Partially Met    Target Date  07/06/18            Plan - 07/05/18 1129    Clinical Impression Statement  Pt limited by pain and joint stiffness. He has improving ability for AROM and elevation. Behind back motion is improved, but still very painful for pt to perform. Passive ER also very painful at end of available range. Pt with soreness and tenderness with palpation of areas surrounding shoulder, pec, clavicle, sub scap, and scapula. Plan to re-eval next visit.     Rehab Potential  Good    PT Frequency  2x / week    PT Duration  4 weeks    PT Treatment/Interventions  ADLs/Self Care Home Management;Cryotherapy;Electrical Stimulation;Iontophoresis 96m/ml Dexamethasone;Moist Heat;Therapeutic activities;Functional mobility training;Ultrasound;Therapeutic exercise;Neuromuscular re-education;Patient/family education;Dry needling;Passive range of motion;Manual techniques;Taping;Joint Manipulations    Consulted and Agree with Plan of Care  Patient       Patient will benefit from skilled therapeutic intervention in order to improve the following deficits and impairments:  Decreased endurance, Hypomobility, Decreased activity tolerance, Decreased strength, Impaired UE functional use, Pain, Increased muscle spasms, Decreased mobility, Decreased range of motion, Improper body mechanics, Impaired flexibility  Visit Diagnosis: Acute pain of left shoulder  Stiffness of left shoulder, not elsewhere classified     Problem List Patient Active Problem List   Diagnosis Date Noted  .  Nocturnal muscle cramps 06/08/2018  . 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 HNP  (herniated nucleus pulposus), cervical 03/16/2017  . 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 ([place]) 01/06/2017  . Herniation of cervical intervertebral disc with radiculopathy 01/04/2017  . Current mild episode of major depressive disorder without prior episode ([place]) 12/02/2016  . Diabetic neuropathy ([place]) 03/01/2016  . Obesity, Class II, BMI 35.0-39.9, with comorbidity (see actual BMI) 04/24/2015  . Type 1 diabetes mellitus with hyperglycemia, with long-term current use of insulin ([place]) 04/04/2015  . Patellofemoral stress syndrome of left knee 03/19/2015  . Allergic rhinitis due to pollen 11/14/2014  . Equinus deformity of foot, acquired 05/29/2014  . ADHD (attention deficit hyperactivity disorder), inattentive type 10/24/2013  . Vitamin D deficiency 02/28/2013  . Pulmonic stenosis 05/25/2012  . Pure hyperglyceridemia 05/25/2012  . Hyperlipidemia with target LDL less than 100 09/20/2011  . OSA (obstructive sleep apnea) 09/20/2011  . BPH (benign prostatic hyperplasia) 09/20/2011  . Routine general medical examination at a health care facility 09/20/2011   Lyndee Hensen, PT, DPT 11:32 AM  07/05/18   [facility] [place], [place], [address] Phone: [phone]   Fax:  [phone]  Name: JIRO KIESTER MRN: [id number] Date of Birth: September 11, 1960

## 2018-07-07 MED FILL — FREESTYLE LIBRE 14 DAY SENS: 28 days supply | Qty: 2 | Fill #4

## 2018-07-07 MED FILL — PANTOPRAZOLE SOD DR 40 MG T: 40 | 90 days supply | Qty: 90 | Fill #1

## 2018-07-10 MED FILL — traZODone HCL 50 MG TABS: 50 | 30 days supply | Qty: 30 | Fill #1

## 2018-07-11 ENCOUNTER — Ambulatory Visit (INDEPENDENT_AMBULATORY_CARE_PROVIDER_SITE_OTHER): Payer: 59 | Admitting: Physical Therapy

## 2018-07-11 DIAGNOSIS — M25512 Pain in left shoulder: Secondary | ICD-10-CM | POA: Diagnosis not present

## 2018-07-11 DIAGNOSIS — M25612 Stiffness of left shoulder, not elsewhere classified: Secondary | ICD-10-CM

## 2018-07-12 ENCOUNTER — Encounter: Payer: Self-pay | Admitting: Physical Therapy

## 2018-07-12 NOTE — Therapy (Addendum)
[facility] [address] [place], [place], [address] Phone: [phone]   Fax:  [phone]  Physical Therapy Treatment/Re-Cert   Patient Details  Name: Zachary Graham MRN: [id number] Date of Birth: [birth year] Referring Provider (PT): [name]   Encounter Date: 07/11/2018  PT End of Session - 07/12/18 1022    Visit Number  18    Number of Visits  24    Date for PT Re-Evaluation  08/01/18    Authorization Type  [facility]    PT Start Time  0804    PT Stop Time  0845    PT Time Calculation (min)  41 min    Activity Tolerance  Patient tolerated treatment well    Behavior During Therapy  [facility] for tasks assessed/performed       Past Medical History:  Diagnosis Date  . Diabetes mellitus   . GERD (gastroesophageal reflux disease)   . Heart murmur   . Hyperlipidemia   . Hypertension   . OSA (obstructive sleep apnea)   . Pneumonia     Past Surgical History:  Procedure Laterality Date  . CERVICAL DISC ARTHROPLASTY N/A 03/16/2017   Procedure: CERVICAL FIVE- CERVICAL SIX [place] ARTHROPLASTY;  Surgeon: Jovita Gamma, MD;  Location: [facility];  Service: Neurosurgery;  Laterality: N/A;  CERVICAL 5- CERVICAL 6 DISC ARTHROPLASTY  . COLONOSCOPY    . TONSILLECTOMY      There were no vitals filed for this visit.  Subjective Assessment - 07/12/18 1021    Subjective  Pt states overall improving pain, during the day. He continues to have difficulty reaching out and up.     Currently in Pain?  Yes    Pain Score  3     Pain Location  Shoulder    Pain Orientation  Left    Pain Descriptors / Indicators  Aching    Pain Type  Acute pain    Pain Onset  More than a month ago    Pain Frequency  Intermittent                       [facility] Adult PT Treatment/Exercise - 07/12/18 0001      Shoulder Exercises: Supine   Protraction  20 reps    Protraction Limitations  SA punch    Flexion  20 reps;AAROM    Flexion Limitations   2lb    Other Supine Exercises  ER butterfly stretch x15;       Shoulder Exercises: Standing   External Rotation  20 reps    Theraband Level (Shoulder External Rotation)  Level 3 (Green)    Internal Rotation  20 reps    Theraband Level (Shoulder Internal Rotation)  Level 3 (Green)    Flexion  AROM;20 reps    Flexion Limitations  Full/    ABduction  AROM;20 reps    ABduction Limitations  full    Row  20 reps    Theraband Level (Shoulder Row)  Level 3 (Green)      Shoulder Exercises: Pulleys   Flexion  2 minutes    ABduction  1 minute      Shoulder Exercises: Stretch   Internal Rotation Stretch  --   x20 stick behind back   Other Shoulder Stretches  Post shoulder stretch 30 sec x 5 ;       Manual Therapy   Joint Mobilization  L GHJ mobs, all motions, gr 2-3 ; scapular mobs all motions.  Passive ROM  L shoulder, all motions; subscap release               PT Short Term Goals - 05/09/18 1004      PT SHORT TERM GOAL #1   Title  Pt to be independent with initial HEP     Time  2    Period  Weeks    Status  Achieved        PT Long Term Goals - 07/12/18 1033      PT LONG TERM GOAL #1   Title  Pt to demo L shoulder AROm, to be WNL, to improve ability for reaching, lifting, and IADLs.     Time  6    Period  Weeks    Status  On-going    Target Date  08/01/18      PT LONG TERM GOAL #2   Title  Pt to demo improved strength of L shoulder, to be at least 4+/5 to improve ability for IADLs and work duties.     Time  6    Period  Weeks    Status  Partially Met    Target Date  08/01/18      PT LONG TERM GOAL #3   Title  Pt to report decreased pain in L shoulder to be 0-2/10 with activity     Time  6    Status  Partially Met    Target Date  08/01/18            Plan - 07/12/18 1023    Clinical Impression Statement  Pt with significant soreness with palpation and attempts for release of subscap. He continues to have GHJ stiffness for end range elevation, and rotation  motions. Rotation motions are accompanied by significant pain. ROM improving slightly, but not significantly. He is improving with Active elevation. He has most limtiation remaining for reaching out to side, and for behind the back motions. Pt will be seen for 1 more visit, prior to his MD follow up, to finalize HEP for continued ROM/stretching.     Rehab Potential  Good    PT Frequency  1x / week    PT Duration  3 weeks    PT Treatment/Interventions  ADLs/Self Care Home Management;Cryotherapy;Electrical Stimulation;Iontophoresis 50m/ml Dexamethasone;Moist Heat;Therapeutic activities;Functional mobility training;Ultrasound;Therapeutic exercise;Neuromuscular re-education;Patient/family education;Dry needling;Passive range of motion;Manual techniques;Taping;Joint Manipulations    Consulted and Agree with Plan of Care  Patient       Patient will benefit from skilled therapeutic intervention in order to improve the following deficits and impairments:  Decreased endurance, Hypomobility, Decreased activity tolerance, Decreased strength, Impaired UE functional use, Pain, Increased muscle spasms, Decreased mobility, Decreased range of motion, Improper body mechanics, Impaired flexibility  Visit Diagnosis: Acute pain of left shoulder - Plan: PT plan of care cert/re-cert  Stiffness of left shoulder, not elsewhere classified - Plan: PT plan of care cert/re-cert     Problem List Patient Active Problem List   Diagnosis Date Noted  . Nocturnal muscle cramps 06/08/2018  . 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 HNP (herniated nucleus pulposus), cervical 03/16/2017  . 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 ([facility] 01/06/2017  . Herniation of cervical intervertebral disc with radiculopathy 01/04/2017  . Current mild episode of major depressive disorder without prior episode ([facility] 12/02/2016  . Diabetic neuropathy  ([facility] 03/01/2016  . Obesity, Class II, BMI 35.0-39.9, with comorbidity (see actual BMI) 04/24/2015  . Type  1 diabetes mellitus with hyperglycemia, with long-term current use of insulin ([place]) 04/04/2015  . Patellofemoral stress syndrome of left knee 03/19/2015  . Allergic rhinitis due to pollen 11/14/2014  . Equinus deformity of foot, acquired 05/29/2014  . ADHD (attention deficit hyperactivity disorder), inattentive type 10/24/2013  . Vitamin D deficiency 02/28/2013  . Pulmonic stenosis 05/25/2012  . Pure hyperglyceridemia 05/25/2012  . Hyperlipidemia with target LDL less than 100 09/20/2011  . OSA (obstructive sleep apnea) 09/20/2011  . BPH (benign prostatic hyperplasia) 09/20/2011  . Routine general medical examination at a health care facility 09/20/2011   Lyndee Hensen, PT, DPT 10:34 AM  07/12/18    [facility] [place], [place], [address] Phone: [phone]   Fax:  [phone]  Name: Zachary Graham MRN: [id number] Date of Birth: 08/28/1960

## 2018-07-14 ENCOUNTER — Telehealth: Payer: Self-pay

## 2018-07-14 NOTE — Telephone Encounter (Signed)
Prior authorization for [place] 14 day system has been approved by [employer].  Coverage is effective until 05/12/19  Approval letter has been sent to scanning.

## 2018-07-18 ENCOUNTER — Ambulatory Visit (INDEPENDENT_AMBULATORY_CARE_PROVIDER_SITE_OTHER): Payer: 59 | Admitting: Physical Therapy

## 2018-07-18 DIAGNOSIS — M25512 Pain in left shoulder: Secondary | ICD-10-CM | POA: Diagnosis not present

## 2018-07-18 DIAGNOSIS — M25612 Stiffness of left shoulder, not elsewhere classified: Secondary | ICD-10-CM

## 2018-07-18 NOTE — Patient Instructions (Signed)
Access Code: [email or web address]  URL: [email or web address]  Date: 07/18/2018  Prepared by: Lyndee Hensen   Exercises  Doorway Pec Stretch at 60 Elevation - 3 reps - 30 hold - 2x daily  Supine Shoulder Flexion with Dowel - 10 reps - 2 sets - 2x daily  Supine Shoulder External Rotation with Dowel - 10 reps - 2 sets - 2x daily  Standing Shoulder Internal Rotation Stretch with Hands Behind Back - 5-10 reps - 1 sets - 2x daily  Shoulder External Rotation with Anchored Resistance - 10 reps - 2 sets - 1x daily  Shoulder Internal Rotation with Resistance - 10 reps - 2 sets - 1x daily  Standing Row with Anchored Resistance - 10 reps - 2 sets - 1x daily  Wall Push Up - 10 reps - 2 sets - 2x daily  Hooklying Shoulder T - 10 reps - 2 sets - 1x daily  Supine Chest Stretch with Elbows Bent - 10 reps - 10 hold - 2x daily  Supine Shoulder External Rotation with Dumbbell - 10 reps - 1 sets - 5 hold - 1x daily  Standing Shoulder Posterior Capsule Stretch - 3 reps - 30 hold - 2x daily

## 2018-07-19 ENCOUNTER — Other Ambulatory Visit: Payer: Self-pay | Admitting: Family Medicine

## 2018-07-19 MED FILL — VIT D2 1.25 MG (50,000 UNIT: 1.25 MG | 84 days supply | Qty: 12 | Fill #0

## 2018-07-19 NOTE — Progress Notes (Signed)
[name] [place], [place] [address] Phone: [phone] Subjective:   Zachary Graham, am serving as a scribe for Dr. Hulan Saas.   CC: Shoulder pain follow-up 06/08/2018: Patient is doing relatively well overall.  Do not feel that we need to redo a repeat injection.  Would like to still have patient's range of motion though improved from what we have seen at the moment the patient's pain is significantly improved.  Patient will continue with formal physical therapy for another 4 weeks.  Patient will follow-up with me in 6 weeks.  At that time we will consider potential other injections.  Labs ordered to check vitamin D and thyroid to make sure they are not contributing.   Update 07/20/2018:l [id number]  Zachary Graham is a 58 y.o. male coming in with complaint of left shoulder pain. Has pain while sleeping and when driving with his shoulder in flexion. Feels he is making progress with physical therapy. Believes he is at 90%.    Patient previously has been diagnosed with more of a cervical radiculopathy as well as then found to have more of a frozen shoulder.  Patient has done significantly well with formal physical therapy but continued to have decreasing range of motion.  Denies any significant weakness though.  Patient is done all conservative therapy including last injection of the shoulder of November 2019.  MRI previously of the cervical spine in 2018 found to have a C5-C6 right paracentral disc protrusion with right foraminal stenosis October underwent a arthroplasty of C5-6  Past Medical History:  Diagnosis Date  . Diabetes mellitus   . GERD (gastroesophageal reflux disease)   . Heart murmur   . Hyperlipidemia   . Hypertension   . OSA (obstructive sleep apnea)   . Pneumonia    Past Surgical History:  Procedure Laterality Date  . CERVICAL DISC ARTHROPLASTY N/A 03/16/2017   Procedure: CERVICAL FIVE- CERVICAL SIX [place] ARTHROPLASTY;   Surgeon: Jovita Gamma, MD;  Location: [facility];  Service: Neurosurgery;  Laterality: N/A;  CERVICAL 5- CERVICAL 6 DISC ARTHROPLASTY  . COLONOSCOPY    . TONSILLECTOMY     Social History   Socioeconomic History  . Marital status: Married    Spouse name: Not on file  . Number of children: 3  . Years of education: Not on file  . Highest education level: Not on file  Occupational History  . Occupation: RADIATION SAFETY OFFICER    Employer: [employer]  Social Needs  . Financial resource strain: Not on file  . Food insecurity:    Worry: Not on file    Inability: Not on file  . Transportation needs:    Medical: Not on file    Non-medical: Not on file  Tobacco Use  . Smoking status: Never Smoker  . Smokeless tobacco: Never Used  Substance and Sexual Activity  . Alcohol use: Graham    Alcohol/week: 0.0 standard drinks  . Drug use: Graham  . Sexual activity: Yes    Partners: Female  Lifestyle  . Physical activity:    Days per week: Not on file    Minutes per session: Not on file  . Stress: Not on file  Relationships  . Social connections:    Talks on phone: Not on file    Gets together: Not on file    Attends religious service: Not on file    Active member of club or organization: Not on file  Attends meetings of clubs or organizations: Not on file    Relationship status: Not on file  Other Topics Concern  . Not on file  Social History Narrative   Regular exercise: runs 3 miles 3x week, takes one day off per week to rest   Caffeine use: stopped all diet soda on 04/23/15 per advice from Sports MD   Allergies  Allergen Reactions  . Lisinopril Swelling and Other (See Comments)    Extreme facial swelling causing hospitalization  . Shellfish Allergy Anaphylaxis  . Zetia [Ezetimibe] Other (See Comments)    ABDOMINAL CRAMPING  . Penicillins Other (See Comments)    UNSPECIFIED REACTION > Childhood allergy    . Cymbalta [Duloxetine Hcl] Other (See Comments)    Severe feet cramps     Family History  Problem Relation Age of Onset  . Colon cancer Maternal Grandfather   . Colon cancer Paternal Grandfather   . Heart disease Father        Valve replacement; pacemaker  . Alcohol abuse Neg Hx   . Asthma Neg Hx   . Diabetes Neg Hx   . Hyperlipidemia Neg Hx   . Hypertension Neg Hx   . Kidney disease Neg Hx   . Stroke Neg Hx     Current Outpatient Medications (Endocrine & Metabolic):  .  glucagon (GLUCAGON EMERGENCY) 1 MG injection, Inject 1 mg into the muscle once as needed. .  insulin lispro (HUMALOG) 100 UNIT/ML injection, Inject into the skin 3 (three) times daily before meals. Insulin pump .  metFORMIN (GLUCOPHAGE) 500 MG tablet, TAKE 2 TABLETS BY MOUTH DAILY WITH SUPPER  Current Outpatient Medications (Cardiovascular):  .  losartan (COZAAR) 50 MG tablet, TAKE 1 TABLET BY MOUTH ONCE DAILY (Patient taking differently: TAKE 50 mg BY MOUTH ONCE DAILY) .  simvastatin (ZOCOR) 40 MG tablet, Take 1 tablet (40 mg total) by mouth daily.  Current Outpatient Medications (Respiratory):  Marland Kitchen  QNASL 80 MCG/ACT AERS, PLACE 4 PUFFS INTO THE NOSE DAILY. (Patient taking differently: Place 2 puffs into the nose daily as needed (allergies). )  Current Outpatient Medications (Analgesics):  .  acetaminophen (TYLENOL) 325 MG tablet, Take 650 mg by mouth every 6 (six) hours as needed for mild pain. Marland Kitchen  aspirin EC 81 MG tablet, Take 81 mg by mouth daily.   Current Outpatient Medications (Other):  Marland Kitchen  Alpha-Lipoic Acid 600 MG CAPS, Take 1 capsule by mouth daily.  Marland Kitchen  amphetamine-dextroamphetamine (ADDERALL) 10 MG tablet, Take 1 tablet (10 mg total) by mouth 2 (two) times daily. Marland Kitchen  amphetamine-dextroamphetamine (ADDERALL) 5 MG tablet, Take 1 tablet (5 mg total) by mouth daily. .  Ascorbic Acid (VITAMIN C) 1000 MG tablet, Take 1,000 mg by mouth daily. Marland Kitchen  b complex vitamins tablet, Take 1 tablet by mouth daily. Marland Kitchen  BAYER MICROLET LANCETS lancets, USE TO TEST BLOOD SUGAR 6 TIMES A DAY .   Calcium-Magnesium-Vitamin D (CALCIUM 500 PO), Take 1 tablet by mouth daily.  .  Continuous Blood Gluc Sensor (FREESTYLE LIBRE 14 DAY SENSOR) MISC, 1 each by Does not apply route every 14 (fourteen) days. Change every 2 weeks .  glucose blood (CONTOUR NEXT TEST) test strip, USE AS INSTRUCTED TO CHECK SUGAR 6 TIMES DAILY .  Magnesium Oxide 400 MG CAPS, Take 1 capsule (400 mg total) by mouth daily. .  Melatonin 5 MG CHEW, Chew 10 tablets by mouth at bedtime.  .  Misc Natural Products (TURMERIC CURCUMIN) CAPS, Take 1 capsule by mouth daily.  Marland Kitchen  NON FORMULARY, CPAP .  pantoprazole (PROTONIX) 40 MG tablet, TAKE 40 mg BY MOUTH ONCE DAILY .  SILVADENE 1 % cream, APPLY 1 APPLICATION TOPICALLY DAILY. .  traZODone (DESYREL) 50 MG tablet, Take 0.5-1 tablets (25-50 mg total) by mouth at bedtime as needed for sleep. .  Vitamin D, Ergocalciferol, (DRISDOL) 1.25 MG (50000 UT) CAPS capsule, TAKE 1 CAPSULE BY MOUTH EVERY 7 DAYS.    Past medical history, social, surgical and family history all reviewed in electronic medical record.  Graham pertanent information unless stated regarding to the chief complaint.   Review of Systems:  Graham headache, visual changes, nausea, vomiting, diarrhea, constipation, dizziness, abdominal pain, skin rash, fevers, chills, night sweats, weight loss, swollen lymph nodes, body aches, joint swelling, muscle aches, chest pain, shortness of breath, mood changes.   Objective  There were Graham vitals taken for this visit. Systems examined below as of    General: Graham apparent distress alert and oriented x3 mood and affect normal, dressed appropriately.  HEENT: Pupils equal, extraocular movements intact  Respiratory: Patient's speak in full sentences and does not appear short of breath  Cardiovascular: Graham lower extremity edema, non tender, Graham erythema  Skin: Warm dry intact with Graham signs of infection or rash on extremities or on axial skeleton.  Abdomen: Soft nontender  Neuro: Cranial nerves  II through XII are intact, neurovascularly intact in all extremities with 2+ DTRs and 2+ pulses.  Lymph: Graham lymphadenopathy of posterior or anterior cervical chain or axillae bilaterally.  Gait normal with good balance and coordination.  MSK:  Non tender with full range of motion and good stability and symmetric strength and tone of shoulders, elbows, wrist, hip, knee and ankles bilaterally.  Neck: Inspection loss of lordosis. Graham palpable stepoffs. Negative Spurling's maneuver. Limited range of motion lacking the last 10 degrees of extension as well as the last 10 degrees of sidebending to the left and rotation of 5 degrees. Grip strength and sensation normal in bilateral hands Strength good C4 to T1 distribution Graham sensory change to C4 to T1 Negative Hoffman sign bilaterally Reflexes normal Tightness of the left trapezius noted   Shoulder: Left shoulder Inspection reveals Graham abnormalities, atrophy or asymmetry. Palpation is normal with Graham tenderness over AC joint or bicipital groove. Range of motion is decreased in all planes.  95 degrees, internal rotation to sacrum, external rotation of 5 degrees Rotator cuff strength normal throughout. Mild impingement noted Speeds and Yergason's tests normal. Graham labral pathology noted with negative Obrien's, negative clunk and good stability. Normal scapular function observed. Graham painful arc and Graham drop arm sign. Graham apprehension sign Contralateral shoulder unremarkable    Impression and Recommendations:     . The above documentation has been reviewed and is accurate and complete [name], DO       Note: This dictation was prepared with Dragon dictation along with smaller phrase technology. Any transcriptional errors that result from this process are unintentional.

## 2018-07-20 ENCOUNTER — Ambulatory Visit (INDEPENDENT_AMBULATORY_CARE_PROVIDER_SITE_OTHER)
Admission: RE | Admit: 2018-07-20 | Discharge: 2018-07-20 | Disposition: A | Discharge status: Home / Self Care | Payer: 59 | Source: Ambulatory Visit | Attending: Family Medicine | Admitting: Family Medicine

## 2018-07-20 ENCOUNTER — Ambulatory Visit: Payer: Self-pay

## 2018-07-20 ENCOUNTER — Ambulatory Visit (INDEPENDENT_AMBULATORY_CARE_PROVIDER_SITE_OTHER): Payer: 59 | Admitting: Family Medicine

## 2018-07-20 ENCOUNTER — Encounter: Payer: Self-pay | Admitting: Physical Therapy

## 2018-07-20 ENCOUNTER — Other Ambulatory Visit: Payer: Self-pay

## 2018-07-20 VITALS — BP 110/62 | HR 67 | Ht 70.0 in | Wt 238.0 lb

## 2018-07-20 DIAGNOSIS — M7502 Adhesive capsulitis of left shoulder: Secondary | ICD-10-CM | POA: Diagnosis not present

## 2018-07-20 DIAGNOSIS — M7500 Adhesive capsulitis of unspecified shoulder: Secondary | ICD-10-CM

## 2018-07-20 DIAGNOSIS — E10618 Type 1 diabetes mellitus with other diabetic arthropathy: Secondary | ICD-10-CM

## 2018-07-20 DIAGNOSIS — M25512 Pain in left shoulder: Secondary | ICD-10-CM

## 2018-07-20 DIAGNOSIS — G8929 Other chronic pain: Secondary | ICD-10-CM

## 2018-07-20 MED ORDER — TRAZODONE HCL 50 MG PO TABS: 25.0000 mg | ORAL_TABLET | Freq: Every evening | ORAL | 3 refills | Status: DC | PRN

## 2018-07-20 NOTE — Assessment & Plan Note (Signed)
Frozen shoulder.  4 months since last injection.  Patient has done formal physical therapy and improved range of motion minorly.  Patient states that the pain is completely free.  Patient also is in stage III frozen shoulder and hopefully the range of motion will improve.  Patient given different choices including MRI, injection, or repeating formal physical therapy or even referral to orthopedic surgery for manipulation of the adhesions.  Patient has elected to try conservative therapy for another 6 weeks.  Patient will come back at that time and then we will consider injection.

## 2018-07-20 NOTE — Patient Instructions (Addendum)
Great to see you as always  You have worked your tail off.  Keep moving  Refilled vitamin D and trazodone today  Move the shoulder as much as you can  Consider iron 65mg  with 500mg  of vitamin C maybe 3 times a week  See me again in 6 weeks!

## 2018-07-20 NOTE — Therapy (Addendum)
[facility] [address] [place], [place], [address] Phone: [phone]   Fax:  [phone]  Physical Therapy Treatment  Patient Details  Name: Zachary Graham MRN: [id number] Date of Birth: 1961/03/13 Referring Provider (PT): [name]   Encounter Date: 07/18/2018  PT End of Session - 07/20/18 1234    Visit Number  19    Number of Visits  24    Date for PT Re-Evaluation  08/01/18    Authorization Type  [facility]    PT Start Time  0845    PT Stop Time  0925    PT Time Calculation (min)  40 min    Activity Tolerance  Patient tolerated treatment well    Behavior During Therapy  [facility] for tasks assessed/performed       Past Medical History:  Diagnosis Date  . Diabetes mellitus   . GERD (gastroesophageal reflux disease)   . Heart murmur   . Hyperlipidemia   . Hypertension   . OSA (obstructive sleep apnea)   . Pneumonia     Past Surgical History:  Procedure Laterality Date  . CERVICAL DISC ARTHROPLASTY N/A 03/16/2017   Procedure: CERVICAL FIVE- CERVICAL SIX [place] ARTHROPLASTY;  Surgeon: Jovita Gamma, MD;  Location[facility];  Service: Neurosurgery;  Laterality: N/A;  CERVICAL 5- CERVICAL 6 DISC ARTHROPLASTY  . COLONOSCOPY    . TONSILLECTOMY      There were no vitals filed for this visit.  Subjective Assessment - 07/20/18 1233    Subjective  Pt states improving pain during the day, mostly pain free. Has increased pain at night, as well as with reaching, lifting, carrying, and increased use.     Patient Stated Goals  decreased pain , improved movement    Currently in Pain?  Yes    Pain Score  3     Pain Location  Shoulder    Pain Orientation  Left    Pain Descriptors / Indicators  Aching    Pain Type  Acute pain;Chronic pain    Pain Onset  More than a month ago    Pain Frequency  Intermittent    Aggravating Factors   sleeping, reaching out to side, behind back, behind head for ADLS.     Pain Relieving Factors  rest, meds,           [facility] PT Assessment - 07/20/18 0001      AROM   Left Shoulder Flexion  125 Degrees    Left Shoulder ABduction  125 Degrees      PROM   Left Shoulder Flexion  145 Degrees    Left Shoulder Internal Rotation  55 Degrees    Left Shoulder External Rotation  65 Degrees      Strength   Overall Strength Comments  taken within available range    Left Shoulder Flexion  4+/5    Left Shoulder ABduction  4+/5    Left Shoulder Internal Rotation  5/5    Left Shoulder External Rotation  5/5                   [facility] Adult PT Treatment/Exercise - 07/20/18 0001      Shoulder Exercises: Supine   Protraction  20 reps    Protraction Limitations  SA punch    External Rotation  20 reps;AROM    External Rotation Weight (lbs)  2    Flexion  20 reps;AAROM    Flexion Limitations  cane    Other Supine  Exercises  ER butterfly stretch x15;       Shoulder Exercises: Standing   External Rotation  20 reps    Theraband Level (Shoulder External Rotation)  Level 3 (Green)    Internal Rotation  20 reps    Theraband Level (Shoulder Internal Rotation)  Level 3 (Green)    Flexion  AROM;20 reps    Flexion Limitations  Full/    ABduction  AROM;20 reps    ABduction Limitations  full    Row  20 reps    Theraband Level (Shoulder Row)  Level 3 (Green)    Other Standing Exercises  Wall slides with 1 UE, x20; wall circles x20 each;       Shoulder Exercises: Pulleys   Flexion  2 minutes    ABduction  1 minute      Shoulder Exercises: Stretch   Internal Rotation Stretch  --   x20 stick behind back   Other Shoulder Stretches  Post shoulder stretch 30 sec x 5 ;       Manual Therapy   Joint Mobilization  L GHJ mobs, all motions, gr 2-3 ; IR Mob/ standing;     Passive ROM  L shoulder, all motions;              PT Education - 07/20/18 1234    Education Details  HEP reviewed    Person(s) Educated  Patient    Methods  Explanation;Demonstration;Handout    Comprehension  Verbalized  understanding       PT Short Term Goals - 05/09/18 1004      PT SHORT TERM GOAL #1   Title  Pt to be independent with initial HEP     Time  2    Period  Weeks    Status  Achieved        PT Long Term Goals - 07/12/18 1033      PT LONG TERM GOAL #1   Title  Pt to demo L shoulder AROm, to be WNL, to improve ability for reaching, lifting, and IADLs.     Time  6    Period  Weeks    Status  On-going    Target Date  08/01/18      PT LONG TERM GOAL #2   Title  Pt to demo improved strength of L shoulder, to be at least 4+/5 to improve ability for IADLs and work duties.     Time  6    Period  Weeks    Status  Partially Met    Target Date  08/01/18      PT LONG TERM GOAL #3   Title  Pt to report decreased pain in L shoulder to be 0-2/10 with activity     Time  6    Status  Partially Met    Target Date  08/01/18            Plan - 07/20/18 1237    Clinical Impression Statement  Pt making improvements with pain and ability for AROM and elevation, as well as functional use. Improvements have been very slow, and pain as limited progression. Pt continues to have pain at end range for all motions, but most pain and restriction is with ER and IR behind the back.  Pt to see MD this week for follow up. Will continue based on MD recommendations.     Rehab Potential  Good    PT Frequency  1x / week    PT Duration  3 weeks    PT Treatment/Interventions  ADLs/Self Care Home Management;Cryotherapy;Electrical Stimulation;Iontophoresis 50m/ml Dexamethasone;Moist Heat;Therapeutic activities;Functional mobility training;Ultrasound;Therapeutic exercise;Neuromuscular re-education;Patient/family education;Dry needling;Passive range of motion;Manual techniques;Taping;Joint Manipulations    Consulted and Agree with Plan of Care  Patient       Patient will benefit from skilled therapeutic intervention in order to improve the following deficits and impairments:  Decreased endurance, Hypomobility,  Decreased activity tolerance, Decreased strength, Impaired UE functional use, Pain, Increased muscle spasms, Decreased mobility, Decreased range of motion, Improper body mechanics, Impaired flexibility  Visit Diagnosis: Acute pain of left shoulder  Stiffness of left shoulder, not elsewhere classified     Problem List Patient Active Problem List   Diagnosis Date Noted  . Nocturnal muscle cramps 06/08/2018  . 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 HNP (herniated nucleus pulposus), cervical 03/16/2017  . 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 ([facility] 01/06/2017  . Herniation of cervical intervertebral disc with radiculopathy 01/04/2017  . Current mild episode of major depressive disorder without prior episode ([facility] 12/02/2016  . Diabetic neuropathy ([facility] 03/01/2016  . Obesity, Class II, BMI 35.0-39.9, with comorbidity (see actual BMI) 04/24/2015  . Type 1 diabetes mellitus with hyperglycemia, with long-term current use of insulin ([facility] 04/04/2015  . Patellofemoral stress syndrome of left knee 03/19/2015  . Allergic rhinitis due to pollen 11/14/2014  . Equinus deformity of foot, acquired 05/29/2014  . ADHD (attention deficit hyperactivity disorder), inattentive type 10/24/2013  . Vitamin D deficiency 02/28/2013  . Pulmonic stenosis 05/25/2012  . Pure hyperglyceridemia 05/25/2012  . Hyperlipidemia with target LDL less than 100 09/20/2011  . OSA (obstructive sleep apnea) 09/20/2011  . BPH (benign prostatic hyperplasia) 09/20/2011  . Routine general medical examination at a health care facility 09/20/2011   LLyndee Hensen PT, DPT 12:47 PM  07/20/18    [place] [address]Phone: [phone]  Fax:  [phone] Name: Zachary VERASMRN: [id number] of Birth: [birth year]   PHYSICAL THERAPY DISCHARGE  SUMMARY Visits: 19   Plan: Patient agrees to discharge.  Patient goals were partially met. Patient is being discharged due to not returning since the last visit.  ?????     Pt last seen 07/18/18. Clinic with reduced hours due to COVID, pt did not return calls at beginning of June to return to clinic for follow up. Will d/c.   LLyndee Hensen PT, DPT 10:49 AM  10/30/18

## 2018-07-28 ENCOUNTER — Other Ambulatory Visit: Payer: Self-pay | Admitting: Internal Medicine

## 2018-07-31 ENCOUNTER — Encounter: Payer: Self-pay | Admitting: Internal Medicine

## 2018-07-31 ENCOUNTER — Other Ambulatory Visit: Payer: Self-pay

## 2018-07-31 ENCOUNTER — Telehealth: Payer: Self-pay | Admitting: Internal Medicine

## 2018-07-31 MED ORDER — INSULIN LISPRO 100 UNIT/ML CARTRIDGE: SUBCUTANEOUS | 11 refills | Status: DC

## 2018-07-31 NOTE — Addendum Note (Signed)
Addended by: Cardell Peach I on: 07/31/2018 01:29 PM   Modules accepted: Orders

## 2018-07-31 NOTE — Telephone Encounter (Signed)
OK to change, at the same dose

## 2018-07-31 NOTE — Telephone Encounter (Signed)
RX changed and sent.

## 2018-07-31 NOTE — Telephone Encounter (Signed)
insulin aspart (NOVOLOG) 100 UNIT/ML injection  [facility] is calling in regards to a prescription that was sent to their pharmacy  They stated the patients insurance will not cover the Novolog but will cover Humalog

## 2018-08-01 ENCOUNTER — Other Ambulatory Visit: Payer: Self-pay

## 2018-08-01 MED ORDER — INSULIN ASPART 100 UNIT/ML ~~LOC~~ SOLN: SUBCUTANEOUS | 3 refills | Status: DC

## 2018-08-03 DIAGNOSIS — G4733 Obstructive sleep apnea (adult) (pediatric): Secondary | ICD-10-CM | POA: Diagnosis not present

## 2018-08-04 MED FILL — traZODone HCL 50 MG TABS: 50 | 30 days supply | Qty: 30 | Fill #2

## 2018-08-04 MED FILL — CONTOUR NEXT STRIPS: 66 days supply | Qty: 400 | Fill #0

## 2018-08-16 ENCOUNTER — Encounter: Payer: Self-pay | Admitting: Internal Medicine

## 2018-08-17 ENCOUNTER — Other Ambulatory Visit: Payer: Self-pay | Admitting: *Deleted

## 2018-08-17 ENCOUNTER — Ambulatory Visit (INDEPENDENT_AMBULATORY_CARE_PROVIDER_SITE_OTHER): Payer: 59 | Admitting: Internal Medicine

## 2018-08-17 ENCOUNTER — Encounter: Payer: Self-pay | Admitting: Family Medicine

## 2018-08-17 ENCOUNTER — Encounter: Payer: Self-pay | Admitting: Internal Medicine

## 2018-08-17 DIAGNOSIS — F900 Attention-deficit hyperactivity disorder, predominantly inattentive type: Secondary | ICD-10-CM | POA: Diagnosis not present

## 2018-08-17 DIAGNOSIS — M5412 Radiculopathy, cervical region: Secondary | ICD-10-CM

## 2018-08-17 DIAGNOSIS — N410 Acute prostatitis: Secondary | ICD-10-CM

## 2018-08-17 DIAGNOSIS — J301 Allergic rhinitis due to pollen: Secondary | ICD-10-CM

## 2018-08-17 DIAGNOSIS — M79602 Pain in left arm: Secondary | ICD-10-CM

## 2018-08-17 MED ORDER — AMPHETAMINE-DEXTROAMPHETAMINE 10 MG PO TABS: 10.0000 mg | ORAL_TABLET | Freq: Two times a day (BID) | ORAL | 0 refills | Status: DC

## 2018-08-17 MED ORDER — SULFAMETHOXAZOLE-TRIMETHOPRIM 800-160 MG PO TABS: 1.0000 | ORAL_TABLET | Freq: Two times a day (BID) | ORAL | 0 refills | Status: AC

## 2018-08-17 MED ORDER — BECLOMETHASONE DIPROPIONATE 80 MCG/ACT NA AERS: 2.0000 | INHALATION_SPRAY | Freq: Every day | NASAL | 5 refills | Status: DC | PRN

## 2018-08-17 MED ORDER — AMPHETAMINE-DEXTROAMPHETAMINE 5 MG PO TABS: 5.0000 mg | ORAL_TABLET | Freq: Every day | ORAL | 0 refills | Status: DC

## 2018-08-17 MED FILL — AMPHETAMINE-DEXTROAMPHETAMI: 10 | 90 days supply | Qty: 180 | Fill #0

## 2018-08-17 NOTE — Progress Notes (Signed)
Virtual Visit via Video Note  I connected with Zachary Graham on 08/17/18 at  1:00 PM EDT by a video enabled telemedicine application and verified that I am speaking with the correct person using two identifiers.   I discussed the limitations of evaluation and management by telemedicine and the availability of in person appointments. The patient expressed understanding and agreed to proceed.  History of Present Illness: He checked in for virtual visit.  He was not willing to come in because of the COVID-19 pandemic.  He complains of a recurrence of nasal congestion and sneezing and wants a refill of his steroid nasal spray.  He also needs a refill of his ADHD medication.  Additionally, he complains of a 5-day history of dysuria and nocturia.  He denies hematuria, penile discharge, pelvic pain, nausea, vomiting, fever, or chills.  He had a prostate infection 2 years ago that was successfully treated with an oral antibiotic.  Other than the nocturia he does not have any other obstructive symptoms.    Observations/Objective: He was alert, calm, and appropriate.  There were no signs of distress.   Assessment and Plan: I will empirically treat him for acute bacterial prostatitis with Bactrim DS 1 tablet twice a day for 30 days.  I refilled his steroid nasal spray.  I refilled his amphetamine stimulant.   Follow Up Instructions: He will stay well-hydrated.  He will let me know if he develops any new or worsening symptoms.  He agrees to be compliant with a 30-day course of Bactrim DS.    I discussed the assessment and treatment plan with the patient. The patient was provided an opportunity to ask questions and all were answered. The patient agreed with the plan and demonstrated an understanding of the instructions.   The patient was advised to call back or seek an in-person evaluation if the symptoms worsen or if the condition fails to improve as anticipated.  I provided 25 minutes of non-face-to-face  time during this encounter.   Scarlette Calico, MD

## 2018-08-21 ENCOUNTER — Ambulatory Visit: Payer: 59 | Admitting: Internal Medicine

## 2018-08-24 ENCOUNTER — Telehealth: Payer: Self-pay

## 2018-08-24 MED FILL — metFORMIN HCL 500 MG TABS: 500 | 90 days supply | Qty: 180 | Fill #1

## 2018-08-24 MED FILL — QNASL 80 MCG/ACT AERS: 80 | 30 days supply | Qty: 11 | Fill #0

## 2018-08-24 MED FILL — CONTOUR NEXT STRIPS: 66 days supply | Qty: 400 | Fill #1

## 2018-08-24 MED FILL — traZODone HCL 50 MG TABS: 50 | 30 days supply | Qty: 30 | Fill #3

## 2018-08-24 NOTE — Telephone Encounter (Signed)
Key: [id number]  PA has been approved through 08/16/2019  mychart message sent to pt informing of same.

## 2018-08-24 NOTE — Telephone Encounter (Signed)
Received a fax from patient insurance regarding his Humalog.  PA needed for the quantity.   Insurance will only allow 4 vials per every 28 days and patient needs 7 vials per every 28 days.  I have reviewed the chart and Humalog is not on his list. Upon further review I see we were contacted by the pharmacy last month to let us know the Novolog he is taking is NOT covered under his insurance but Humalog is.  Dr. Cruzita Lederer sent over Humalog.   Patient then sent a message that we need to "authorize" his Novolog to be refilled even though his insurance does not cover it, and at that time the Humalog was removed off his list and the Novolog refilled.  Why are we getting a PA request for Humalog if we sent Novolog? And if Novolog isn't covered then why haven't we gotten a PA request for the Novolog?

## 2018-08-25 MED FILL — HumaLOG 100 UNIT/ML SOLN: 100 | 84 days supply | Qty: 120 | Fill #0

## 2018-08-25 MED FILL — FREESTYLE LIBRE 14 DAY SENS: 28 days supply | Qty: 2 | Fill #5

## 2018-08-28 NOTE — Telephone Encounter (Signed)
Spoke to patient, he has already picked up the Humalog without insurance issues.

## 2018-08-31 ENCOUNTER — Encounter: Payer: Self-pay | Admitting: Internal Medicine

## 2018-08-31 ENCOUNTER — Ambulatory Visit: Payer: 59 | Admitting: Family Medicine

## 2018-09-04 ENCOUNTER — Telehealth: Payer: Self-pay | Admitting: Nutrition

## 2018-09-04 NOTE — Telephone Encounter (Signed)
Patient reported that he was not able to link his [employer] account.  He was given the numbers again, and he said he would try this again, and call if he can not do this.

## 2018-09-05 ENCOUNTER — Other Ambulatory Visit: Payer: Self-pay

## 2018-09-05 ENCOUNTER — Encounter: Payer: Self-pay | Admitting: Family Medicine

## 2018-09-05 DIAGNOSIS — M542 Cervicalgia: Principal | ICD-10-CM

## 2018-09-05 DIAGNOSIS — G8929 Other chronic pain: Secondary | ICD-10-CM

## 2018-09-08 ENCOUNTER — Ambulatory Visit
Admission: RE | Admit: 2018-09-08 | Discharge: 2018-09-08 | Disposition: A | Discharge status: Home / Self Care | Payer: 59 | Source: Ambulatory Visit | Attending: Family Medicine | Admitting: Family Medicine

## 2018-09-08 ENCOUNTER — Other Ambulatory Visit: Payer: Self-pay

## 2018-09-08 DIAGNOSIS — G8929 Other chronic pain: Secondary | ICD-10-CM

## 2018-09-08 DIAGNOSIS — M542 Cervicalgia: Principal | ICD-10-CM

## 2018-09-08 DIAGNOSIS — R202 Paresthesia of skin: Secondary | ICD-10-CM | POA: Diagnosis not present

## 2018-09-25 ENCOUNTER — Other Ambulatory Visit: Payer: Self-pay | Admitting: Family Medicine

## 2018-09-25 ENCOUNTER — Other Ambulatory Visit: Payer: Self-pay | Admitting: Internal Medicine

## 2018-09-25 DIAGNOSIS — E1065 Type 1 diabetes mellitus with hyperglycemia: Secondary | ICD-10-CM

## 2018-09-25 MED FILL — FREESTYLE LIBRE 14 DAY SENS: 28 days supply | Qty: 2 | Fill #6

## 2018-09-25 MED FILL — PANTOPRAZOLE SOD DR 40 MG T: 40 | 90 days supply | Qty: 90 | Fill #0

## 2018-09-25 MED FILL — QNASL 80 MCG/ACT AERS: 80 | 30 days supply | Qty: 11 | Fill #1

## 2018-09-26 MED FILL — VIT D2 1.25 MG (50,000 UNIT: 1.25 MG | 84 days supply | Qty: 12 | Fill #0

## 2018-09-29 ENCOUNTER — Ambulatory Visit (INDEPENDENT_AMBULATORY_CARE_PROVIDER_SITE_OTHER): Payer: 59 | Admitting: Internal Medicine

## 2018-09-29 ENCOUNTER — Other Ambulatory Visit: Payer: Self-pay

## 2018-09-29 ENCOUNTER — Encounter: Payer: Self-pay | Admitting: Internal Medicine

## 2018-09-29 DIAGNOSIS — R5382 Chronic fatigue, unspecified: Secondary | ICD-10-CM

## 2018-09-29 DIAGNOSIS — E785 Hyperlipidemia, unspecified: Secondary | ICD-10-CM

## 2018-09-29 DIAGNOSIS — E1065 Type 1 diabetes mellitus with hyperglycemia: Secondary | ICD-10-CM | POA: Diagnosis not present

## 2018-09-29 MED ORDER — DULAGLUTIDE 0.75 MG/0.5ML ~~LOC~~ SOAJ: SUBCUTANEOUS | 1 refills | Status: DC

## 2018-09-29 MED FILL — TRULICITY 0.75 MG/0.5 ML PE: 0.75 | 28 days supply | Qty: 2 | Fill #0

## 2018-09-29 NOTE — Progress Notes (Signed)
Patient ID: Zachary Graham, male   DOB: [birth year], 58 y.o.   MRN: [id number]  Patient location: Work My location: Office  Referring Provider: [name], MD  I connected with the patient on 09/29/18 at  3:08 PM EDT by a video enabled telemedicine application and verified that I am speaking with the correct person.   I discussed the limitations of evaluation and management by telemedicine and the availability of in person appointments. The patient expressed understanding and agreed to proceed.   Details of the encounter are shown below.  HPI: Zachary Graham is a 58 y.o.-year-old male, presenting for f/u for DM1, dx 1993, insulin-dependent, uncontrolled, with complications (mild sensory peripheral neuropathy; hypoglycemia episodes, DR). Last visit 5 months ago.  He did very well in the past on the Nutrisystem diet but he stopped 2 months before our last visit and his sugars and weight increased.  He started on insulin pump in 2006.  He continues on Medtronic 670 G with previously NovoLog in the pump, will change to Humalog soon per insurance preference.  Last hemoglobin A1c was: Lab Results  Component Value Date   HGBA1C 7.7 (A) 05/02/2018   HGBA1C 7.3 (A) 01/05/2018   HGBA1C 8.1 (A) 10/03/2017  04/05/2014: HbA1c 7.3%  Pump settings: - Basal rates: 12 AM: 2.7 4 AM: 2.85 8 AM: 2.7 12 PM: 2.65 4 PM: 2.85 9:00 PM: 2.75 10:30 PM: 2.70 - bolus: - Insulin to carb ratio:  12 AM: 2 11 AM: 2 5 PM: 2 - Insulin sensitivity factor: 13 - target: 100-120 - Active insulin time: 3 hours TDD basal: 31% >> 47% >> 40% (66 units) TDD bolus: 69% >> 53% >> 60% (97 units) TDD 122 units >> 165-190 units - Bolus wizard: On - Changes the pump site: every 1-2 days  Pt checks his sugars >4X a day - ave. 183 +/- 64 >> ave 169+/-55 in the last 2 weeks by fingersticks, but he also uses his freestyle libre CGM:  Freestyle libre CGM parameters: - Average: 158 - % active CGM time: 97% of the  time - Glucose variability 31.6% (target < or = to 36%) - time in range:  - very low (<54): 0% - low (54-69): 1% - normal range (70-180): 70% - high sugars (181-250): 24% - very high sugars (>250): 5%  CBG 25% - 75%:   He had a history of severe hypoglycemia episode in 2015.  She has a non-expired glucagon kit at home. No previous episodes of DKA.  -No CKD, last BUN/creatinine:  Lab Results  Component Value Date   BUN 16 06/08/2018   CREATININE 0.75 06/08/2018  04/11/2018: 10/0.778, ACR 11.1 On Cozaar. -+ HL; last set of lipids: Lab Results  Component Value Date   CHOL 89 10/03/2017   HDL 34.10 (L) 10/03/2017   LDLCALC 33 10/03/2017   LDLDIRECT 87.0 12/02/2016   TRIG 109.0 10/03/2017   CHOLHDL 3 10/03/2017  On Zocor. 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 [facility].  - + numbness and tingling in his feet.  Improved on alpha lipoic acid and B complex  He also has a history of GERD, HTN, OSA  Latest TSH normal: Lab Results  Component Value Date   TSH 2.83 06/08/2018  04/11/2018: TSH 3.02  Latest vitamin D level normal: Lab Results  Component Value Date   VD25OH 83.45 10/03/2017   VD25OH 41.95 06/06/2015   VD25OH 35.03 07/17/2014   VD25OH 44 03/01/2013  04/11/2018: vitamin D 46.7.  Previously on ergocalciferol.  He has adhesive capsulitis.  He is getting occasional steroid shots.  He has 1 coming up.  ROS: Constitutional: + weight gain/no weight loss, + fatigue, no subjective hyperthermia, no subjective hypothermia Eyes: no blurry vision, no xerophthalmia ENT: no sore throat, no nodules palpated in neck, no dysphagia, no odynophagia, no hoarseness Cardiovascular: no CP/no SOB/no palpitations/no leg swelling Respiratory: no cough/no SOB/no wheezing Gastrointestinal: no N/no V/no D/no C/no acid reflux Musculoskeletal: no muscle aches/ +joint aches Skin: no rashes, no hair loss Neurological: no tremors/+ numbness/+ tingling/no  dizziness  I reviewed pt's medications, allergies, PMH, social hx, family hx, and changes were documented in the history of present illness. Otherwise, unchanged from my initial visit note.   Past Medical History:  Diagnosis Date  . Diabetes mellitus   . GERD (gastroesophageal reflux disease)   . Heart murmur   . Hyperlipidemia   . Hypertension   . OSA (obstructive sleep apnea)   . Pneumonia    Past Surgical History:  Procedure Laterality Date  . CERVICAL DISC ARTHROPLASTY N/A 03/16/2017   Procedure: CERVICAL FIVE- CERVICAL SIX DISC ARTHROPLASTY;  Surgeon: Nudelman, Robert, MD;  Location: [facility] OR;  Service: Neurosurgery;  Laterality: N/A;  CERVICAL 5- CERVICAL 6 DISC ARTHROPLASTY  . COLONOSCOPY    . TONSILLECTOMY     Social History   Socioeconomic History  . Marital status: Married    Spouse name: Not on file  . Number of children: 3  . Years of education: Not on file  . Highest education level: Not on file  Occupational History  . Occupation: RADIATION SAFETY OFFICER    Employer: [employer]  Social Needs  . Financial resource strain: Not on file  . Food insecurity:    Worry: Not on file    Inability: Not on file  . Transportation needs:    Medical: Not on file    Non-medical: Not on file  Tobacco Use  . Smoking status: Never Smoker  . Smokeless tobacco: Never Used  Substance and Sexual Activity  . Alcohol use: No    Alcohol/week: 0.0 standard drinks  . Drug use: No  . Sexual activity: Yes    Partners: Female  Lifestyle  . Physical activity:    Days per week: Not on file    Minutes per session: Not on file  . Stress: Not on file  Relationships  . Social connections:    Talks on phone: Not on file    Gets together: Not on file    Attends religious service: Not on file    Active member of club or organization: Not on file    Attends meetings of clubs or organizations: Not on file    Relationship status: Not on file  . Intimate partner violence:    Fear of  current or ex partner: Not on file    Emotionally abused: Not on file    Physically abused: Not on file    Forced sexual activity: Not on file  Other Topics Concern  . Not on file  Social History Narrative   Regular exercise: runs 3 miles 3x week, takes one day off per week to rest   Caffeine use: stopped all diet soda on 04/23/15 per advice from Sports MD   Current Outpatient Medications on File Prior to Visit  Medication Sig Dispense Refill  . Alpha-Lipoic Acid 600 MG CAPS Take 1 capsule by mouth daily.     . amphetamine-dextroamphetamine (ADDERALL)   10 MG tablet Take 1 tablet (10 mg total) by mouth 2 (two) times daily. 180 tablet 0  . amphetamine-dextroamphetamine (ADDERALL) 5 MG tablet Take 1 tablet (5 mg total) by mouth daily. 90 tablet 0  . Ascorbic Acid (VITAMIN C) 1000 MG tablet Take 1,000 mg by mouth daily.    Marland Kitchen aspirin EC 81 MG tablet Take 81 mg by mouth daily.    Marland Kitchen atorvastatin (LIPITOR) 80 MG tablet Take 80 mg by mouth daily.    Marland Kitchen b complex vitamins tablet Take 1 tablet by mouth daily.    Marland Kitchen BAYER MICROLET LANCETS lancets USE TO TEST BLOOD SUGAR 6 TIMES A DAY 300 each 11  . Beclomethasone Dipropionate (QNASL) 80 MCG/ACT AERS Place 2 puffs into the nose daily as needed (allergies). 10.6 g 5  . Biotin 5000 MCG CAPS Take by mouth.    . Calcium-Magnesium-Vitamin D (CALCIUM 500 PO) Take 1 tablet by mouth daily.     . Continuous Blood Gluc Sensor (FREESTYLE LIBRE 14 DAY SENSOR) MISC 1 each by Does not apply route every 14 (fourteen) days. Change every 2 weeks 2 each 11  . glucagon (GLUCAGON EMERGENCY) 1 MG injection Inject 1 mg into the muscle once as needed. 2 each prn  . glucose blood (CONTOUR NEXT TEST) test strip USE AS INSTRUCTED TO CHECK SUGAR 6 TIMES DAILY 400 each 5  . losartan (COZAAR) 50 MG tablet TAKE 1 TABLET BY MOUTH ONCE DAILY (Patient taking differently: TAKE 50 mg BY MOUTH ONCE DAILY) 90 tablet 1  . Magnesium Oxide 400 MG CAPS Take 1 capsule (400 mg total) by mouth  daily. 90 capsule 1  . metFORMIN (GLUCOPHAGE) 500 MG tablet TAKE 2 TABLETS BY MOUTH DAILY WITH SUPPER 180 tablet 1  . Misc Natural Products (TURMERIC CURCUMIN) CAPS Take 1 capsule by mouth daily.     . NON FORMULARY CPAP    . pantoprazole (PROTONIX) 40 MG tablet TAKE 1 TABLET BY MOUTH EVERY DAILY 90 tablet 1  . SILVADENE 1 % cream APPLY 1 APPLICATION TOPICALLY DAILY. 50 g 0  . traZODone (DESYREL) 50 MG tablet Take 0.5-1 tablets (25-50 mg total) by mouth at bedtime as needed for sleep. 30 tablet 3  . Vitamin D, Ergocalciferol, (DRISDOL) 1.25 MG (50000 UT) CAPS capsule TAKE 1 CAPSULE BY MOUTH EVERY 7 DAYS. 12 capsule 0   No current facility-administered medications on file prior to visit.    Allergies  Allergen Reactions  . Lisinopril Swelling and Other (See Comments)    Extreme facial swelling causing hospitalization  . Shellfish Allergy Anaphylaxis  . Zetia [Ezetimibe] Other (See Comments)    ABDOMINAL CRAMPING  . Penicillins Other (See Comments)    UNSPECIFIED REACTION > Childhood allergy    . Cymbalta [Duloxetine Hcl] Other (See Comments)    Severe feet cramps   Family History  Problem Relation Age of Onset  . Colon cancer Maternal Grandfather   . Colon cancer Paternal Grandfather   . Heart disease Father        Valve replacement; pacemaker  . Alcohol abuse Neg Hx   . Asthma Neg Hx   . Diabetes Neg Hx   . Hyperlipidemia Neg Hx   . Hypertension Neg Hx   . Kidney disease Neg Hx   . Stroke Neg Hx     PE: There were no vitals taken for this visit. There is no height or weight on file to calculate BMI.  Wt Readings from Last 3 Encounters:  07/20/18 238 lb (  108 kg)  06/08/18 231 lb (104.8 kg)  05/02/18 227 lb (103 kg)   Constitutional:  in NAD  The physical exam was not performed (virtual visit).  ASSESSMENT: 1. DM1, insulin-dependent, uncontrolled, without complications - DR - PN  -Insulin pump -Was on a Dexcom CGM and previously on Enlite CGM, which found less  accurate >> retried this Summer 2017 >> still poor accuracy.  Currently on freestyle libre CGM.  - Barriers to good control:  Very busy schedule >> less time to check sugars and eat but But he does a great job with bolusing during the day >> still has a lot of large boluses during the day, sometimes without documented carbs or sugars  Lack of sleep >> usually sleeps max 5h a night (!) and tries to catch up in the weekend  Reaches home late at night  - sometimes at 1 am >> eats >65% of the daily calories then  2. HL  3.  Fatigue  PLAN:  1. Patient with longstanding, uncontrolled, type 1 diabetes, with high insulin resistance, on an increased amount of insulin throughout the day.  He increased his basal rates at last visit as his sugars increased after he came off the Nutrisystem diet, and after the holidays.  Last visit, HbA1c was higher, at 7.7%. -He is usually not using the CGM interpreted with the Medtronic insulin pump but uses a freestyle libre CGM. -At this visit, we reviewed the pump downloads together.  In the last week, sugars have been and they are mostly at goal, with 3 exceptions.  In the previously he has had sugars in the afternoon.  Regarding patterns, it appears that his sugars are mostly higher after breakfast and occasionally after the other meals, 2, but not this consistent.  He is using a very low insulin to carb ratio of 1: 2.  He had to change his pump insulin recently from NovoLog to Humalog per insurance preference.  We have to be careful with his dosing since he prefers to bolus frequently and sometimes he starts his insulin dropping his sugars.  Reviewing his downloads, he still bolusing many times during the day and he actually gets 60% of his daily insulin from boluses 40% from basal this is not very unusual for somebody with high insulin resistance.   -We discussed at this visit about the possibility of adding a GLP-1 receptor agonist, which I think would be very  beneficial for him.  I explained that we usually use this for type 2 diabetes, however, with his insulin resistance, he appears to have a high grade of type I plus type II diabetes.  She agrees to try this.  I hope his insurance will cover it. -I advised him to let me know if he tolerates it well so I can call in the higher dose to his pharmacy. -If the medication is not covered by his insurance, we may need to lower his insulin to carb ratios.  I also advised him to do so if he gets another injection of steroids. - I advised him to:  Patient Instructions  Please continue: - Basal rates: 12 AM: 2.7 4 AM: 2.85 8 AM: 2.7 12 PM: 2.65 4 PM: 2.85 9:00 PM: 2.75 10:30 PM: 2.70 - bolus: - Insulin to carb ratio:  12 AM: 2 11 AM: 2 5 PM: 2 - Insulin sensitivity factor: 13 - target: 100-120 - Active insulin time: 3 hours  Start the boluses 15 minutes before each meal.  Please   start Trulicity 3.54 mg weekly. Let me know when you are close to running out to call in the higher dose to your pharmacy (1.5 mg).  Please come back for a follow-up appointment in 3-4 months.  -We will check his HbA1c when he returns to the clinic - continue checking sugars at different times of the day - check 4x a day, rotating checks - advised for yearly eye exams >> he is not UTD - Return to clinic in 4 mo with sugar log    2. HL - Reviewed latest lipid panel from 09/2017: At goal, with the exception of a slightly low HDL Lab Results  Component Value Date   CHOL 89 10/03/2017   HDL 34.10 (L) 10/03/2017   LDLCALC 33 10/03/2017   LDLDIRECT 87.0 12/02/2016   TRIG 109.0 10/03/2017   CHOLHDL 3 10/03/2017  - Continues the statin without side effects.   3.  Fatigue -He continues on B12 vitamin -TSH level was normal at last check -Vitamin D was normal at last check -Hemoglobin was low and he had investigation for this.  He has a colonoscopy pending.  He could not have this done yet due to the coronavirus  pandemic. -She has chronic insomnia which can contribute -She also gained more than 10 pounds since last visit, which can also contribute   Philemon Kingdom, MD PhD [facility] End[facility]

## 2018-09-29 NOTE — Patient Instructions (Addendum)
Please continue: - Basal rates: 12 AM: 2.7 4 AM: 2.85 8 AM: 2.7 12 PM: 2.65 4 PM: 2.85 9:00 PM: 2.75 10:30 PM: 2.70 - bolus: - Insulin to carb ratio:  12 AM: 2 11 AM: 2 5 PM: 2 - Insulin sensitivity factor: 13 - target: 100-120 - Active insulin time: 3 hours  Please start Trulicity 9.51 mg weekly. Let me know when you are close to running out to call in the higher dose to your pharmacy (1.5 mg).  Start the boluses 15 minutes before each meal.  Please come back for a follow-up appointment in 4 months.

## 2018-10-01 ENCOUNTER — Encounter: Payer: Self-pay | Admitting: Internal Medicine

## 2018-10-04 ENCOUNTER — Encounter: Payer: Self-pay | Admitting: Internal Medicine

## 2018-10-05 DIAGNOSIS — G4733 Obstructive sleep apnea (adult) (pediatric): Secondary | ICD-10-CM | POA: Diagnosis not present

## 2018-10-05 DIAGNOSIS — Z9989 Dependence on other enabling machines and devices: Secondary | ICD-10-CM | POA: Diagnosis not present

## 2018-10-12 ENCOUNTER — Encounter: Payer: Self-pay | Admitting: Internal Medicine

## 2018-10-13 ENCOUNTER — Other Ambulatory Visit: Payer: Self-pay | Admitting: Internal Medicine

## 2018-10-13 DIAGNOSIS — G4733 Obstructive sleep apnea (adult) (pediatric): Secondary | ICD-10-CM | POA: Diagnosis not present

## 2018-10-17 ENCOUNTER — Other Ambulatory Visit: Payer: Self-pay | Admitting: Family Medicine

## 2018-10-17 MED FILL — traZODone HCL 50 MG TABS: 50 | 30 days supply | Qty: 30 | Fill #0

## 2018-10-17 MED FILL — FREESTYLE LIBRE 14 DAY SENS: 28 days supply | Qty: 2 | Fill #7

## 2018-10-18 ENCOUNTER — Other Ambulatory Visit: Payer: Self-pay

## 2018-10-18 ENCOUNTER — Encounter: Payer: Self-pay | Admitting: Internal Medicine

## 2018-10-18 ENCOUNTER — Ambulatory Visit (INDEPENDENT_AMBULATORY_CARE_PROVIDER_SITE_OTHER): Payer: 59 | Admitting: Internal Medicine

## 2018-10-18 VITALS — BP 130/80 | HR 78 | Temp 98.6°F | Ht 70.0 in | Wt 238.0 lb

## 2018-10-18 DIAGNOSIS — N400 Enlarged prostate without lower urinary tract symptoms: Secondary | ICD-10-CM | POA: Diagnosis not present

## 2018-10-18 DIAGNOSIS — E1065 Type 1 diabetes mellitus with hyperglycemia: Secondary | ICD-10-CM

## 2018-10-18 DIAGNOSIS — F900 Attention-deficit hyperactivity disorder, predominantly inattentive type: Secondary | ICD-10-CM | POA: Diagnosis not present

## 2018-10-18 DIAGNOSIS — Z0000 Encounter for general adult medical examination without abnormal findings: Secondary | ICD-10-CM | POA: Diagnosis not present

## 2018-10-18 DIAGNOSIS — E559 Vitamin D deficiency, unspecified: Secondary | ICD-10-CM

## 2018-10-18 DIAGNOSIS — E785 Hyperlipidemia, unspecified: Secondary | ICD-10-CM

## 2018-10-18 DIAGNOSIS — N410 Acute prostatitis: Secondary | ICD-10-CM | POA: Diagnosis not present

## 2018-10-18 MED ORDER — AMPHETAMINE-DEXTROAMPHET ER 20 MG PO CP24: 20.0000 mg | ORAL_CAPSULE | ORAL | 0 refills | Status: DC

## 2018-10-18 NOTE — Patient Instructions (Signed)

## 2018-10-18 NOTE — Progress Notes (Signed)
Subjective:  Patient ID: Zachary Graham, male    DOB: 1960/11/05  Age: 58 y.o. MRN: [id number]  CC: Hyperlipidemia; Diabetes; and Annual Exam   HPI Zachary Graham presents for a CPX.  He is not doing well on his current dose of Adderall.  He takes the twice daily dosing and says he frequently forgets to take the dose later in the day and subsequently experiences a lack of focus, decreased concentration, and poor productivity later in the day.  He wants to change to a once daily formulation.  He also complains of urinary hesitancy and weak stream.  He has a dull ache in his perineum but he denies dysuria or hematuria.  Outpatient Medications Prior to Visit  Medication Sig Dispense Refill  . Alpha-Lipoic Acid 600 MG CAPS Take 1 capsule by mouth daily.     . Ascorbic Acid (VITAMIN C) 1000 MG tablet Take 1,000 mg by mouth daily.    Marland Kitchen aspirin EC 81 MG tablet Take 81 mg by mouth daily.    Marland Kitchen atorvastatin (LIPITOR) 80 MG tablet Take 80 mg by mouth daily.    Marland Kitchen b complex vitamins tablet Take 1 tablet by mouth daily.    Marland Kitchen BAYER MICROLET LANCETS lancets USE TO TEST BLOOD SUGAR 6 TIMES A DAY 300 each 11  . Beclomethasone Dipropionate (QNASL) 80 MCG/ACT AERS Place 2 puffs into the nose daily as needed (allergies). 10.6 g 5  . Biotin 5000 MCG CAPS Take by mouth.    . Calcium-Magnesium-Vitamin D (CALCIUM 500 PO) Take 1 tablet by mouth daily.     . Continuous Blood Gluc Sensor (FREESTYLE LIBRE 14 DAY SENSOR) MISC 1 each by Does not apply route every 14 (fourteen) days. Change every 2 weeks 2 each 11  . Dulaglutide (TRULICITY) 9.03 YB/3.3OV SOPN Inject 0.75 mg in am weekly under skin 4 pen 1  . glucagon (GLUCAGON EMERGENCY) 1 MG injection Inject 1 mg into the muscle once as needed. 2 each prn  . glucose blood (CONTOUR NEXT TEST) test strip USE AS INSTRUCTED TO CHECK SUGAR 6 TIMES DAILY 400 each 5  . losartan (COZAAR) 50 MG tablet TAKE 1 TABLET BY MOUTH ONCE DAILY (Patient taking differently: TAKE 50 mg BY  MOUTH ONCE DAILY) 90 tablet 1  . Magnesium Oxide 400 MG CAPS Take 1 capsule (400 mg total) by mouth daily. 90 capsule 1  . metFORMIN (GLUCOPHAGE) 500 MG tablet TAKE 2 TABLETS BY MOUTH DAILY WITH SUPPER 180 tablet 1  . Misc Natural Products (TURMERIC CURCUMIN) CAPS Take 1 capsule by mouth daily.     . NON FORMULARY CPAP    . pantoprazole (PROTONIX) 40 MG tablet TAKE 1 TABLET BY MOUTH EVERY DAILY 90 tablet 1  . SILVADENE 1 % cream APPLY 1 APPLICATION TOPICALLY DAILY. 50 g 0  . traZODone (DESYREL) 50 MG tablet TAKE 1/2 - 1 TABLET (25-50 MG TOTAL) BY MOUTH AT BEDTIME AS NEEDED FOR SLEEP. 30 tablet 3  . Vitamin D, Ergocalciferol, (DRISDOL) 1.25 MG (50000 UT) CAPS capsule TAKE 1 CAPSULE BY MOUTH EVERY 7 DAYS. 12 capsule 0  . amphetamine-dextroamphetamine (ADDERALL) 10 MG tablet Take 1 tablet (10 mg total) by mouth 2 (two) times daily. 180 tablet 0  . amphetamine-dextroamphetamine (ADDERALL) 5 MG tablet Take 1 tablet (5 mg total) by mouth daily. 90 tablet 0   No facility-administered medications prior to visit.     ROS Review of Systems  Constitutional: Negative.  Negative for appetite change, diaphoresis, fatigue and  unexpected weight change.  HENT: Negative.  Negative for trouble swallowing.   Eyes: Negative for visual disturbance.  Respiratory: Negative for cough, chest tightness, shortness of breath and wheezing.   Cardiovascular: Negative for chest pain, palpitations and leg swelling.  Gastrointestinal: Negative for abdominal pain, blood in stool, constipation, diarrhea, nausea and vomiting.  Endocrine: Negative.  Negative for polydipsia, polyphagia and polyuria.  Genitourinary: Positive for difficulty urinating. Negative for dysuria, flank pain, frequency, penile pain, penile swelling, scrotal swelling, testicular pain and urgency.  Musculoskeletal: Negative.  Negative for myalgias.  Skin: Negative.  Negative for color change, pallor and rash.  Neurological: Negative.  Negative for  dizziness, weakness, light-headedness and headaches.  Hematological: Negative for adenopathy. Does not bruise/bleed easily.  Psychiatric/Behavioral: Positive for decreased concentration. Negative for agitation, dysphoric mood, self-injury and sleep disturbance. The patient is not nervous/anxious.     Objective:  BP 130/80 (BP Location: Left Arm, Patient Position: Sitting, Cuff Size: Large)   Pulse 78   Temp 98.6 F (37 C) (Oral)   Ht 5\' 10"  (1.778 m)   Wt 238 lb (108 kg)   SpO2 97%   BMI 34.15 kg/m   BP Readings from Last 3 Encounters:  10/18/18 130/80  07/20/18 110/62  06/08/18 122/70    Wt Readings from Last 3 Encounters:  10/18/18 238 lb (108 kg)  07/20/18 238 lb (108 kg)  06/08/18 231 lb (104.8 kg)    Physical Exam Vitals signs reviewed.  Constitutional:      General: He is not in acute distress.    Appearance: He is obese. He is not ill-appearing, toxic-appearing or diaphoretic.  HENT:     Nose: Nose normal.     Mouth/Throat:     Mouth: Mucous membranes are moist.  Eyes:     General: No scleral icterus.    Conjunctiva/sclera: Conjunctivae normal.  Neck:     Musculoskeletal: Normal range of motion. No neck rigidity or muscular tenderness.  Cardiovascular:     Rate and Rhythm: Normal rate and regular rhythm.     Heart sounds: No murmur.  Pulmonary:     Effort: Pulmonary effort is normal.     Breath sounds: No stridor. No wheezing, rhonchi or rales.  Abdominal:     General: Abdomen is protuberant. Bowel sounds are normal.     Palpations: Abdomen is soft. There is no hepatomegaly or splenomegaly.     Tenderness: There is no abdominal tenderness.     Hernia: No hernia is present. There is no hernia in the right inguinal area.  Genitourinary:    Pubic Area: No rash.      Penis: Normal. No discharge, swelling or lesions.      Scrotum/Testes: Normal.        Right: Mass, tenderness or swelling not present.        Left: Mass, tenderness or swelling not present.      Epididymis:     Right: Normal. Not inflamed or enlarged. No mass.     Left: Normal. Not inflamed or enlarged. No mass.     Prostate: Enlarged (2+ BPH, boggy and mildly tender) and tender. No nodules present.     Rectum: Normal. Guaiac result negative. No mass, tenderness, anal fissure, external hemorrhoid or internal hemorrhoid. Normal anal tone.  Musculoskeletal: Normal range of motion.     Right lower leg: No edema.     Left lower leg: No edema.  Lymphadenopathy:     Cervical: No cervical adenopathy.  Lower Body: No right inguinal adenopathy. No left inguinal adenopathy.  Skin:    General: Skin is warm and dry.  Neurological:     General: No focal deficit present.     Mental Status: Mental status is at baseline.  Psychiatric:        Mood and Affect: Mood normal.        Behavior: Behavior normal.     Lab Results  Component Value Date   WBC 8.0 06/08/2018   HGB 13.8 06/08/2018   HCT 40.4 06/08/2018   PLT 224.0 06/08/2018   GLUCOSE 122 (H) 10/19/2018   CHOL 99 10/19/2018   TRIG 118.0 10/19/2018   HDL 34.70 (L) 10/19/2018   LDLDIRECT 87.0 12/02/2016   LDLCALC 41 10/19/2018   ALT 23 10/19/2018   AST 16 10/19/2018   NA 138 10/19/2018   K 4.3 10/19/2018   CL 101 10/19/2018   CREATININE 0.83 10/19/2018   BUN 12 10/19/2018   CO2 24 10/19/2018   TSH 2.54 10/19/2018   PSA 0.31 10/19/2018   HGBA1C 8.2 (H) 10/19/2018   MICROALBUR 1.3 10/19/2018    Mr Cervical Spine Wo Contrast  Result Date: 09/08/2018 CLINICAL DATA:  Bilateral hand numbness and grip weakness for the past 3 weeks. Prior surgery in October 2018. EXAM: MRI CERVICAL SPINE WITHOUT CONTRAST TECHNIQUE: Multiplanar, multisequence MR imaging of the cervical spine was performed. No intravenous contrast was administered. COMPARISON:  Cervical spine x-rays dated April 11, 2017. MRI cervical spine dated January 11, 2017. FINDINGS: Alignment: Physiologic. Vertebrae: No fracture, evidence of discitis, or bone lesion.  Cord: Normal signal and morphology. Posterior Fossa, vertebral arteries, paraspinal tissues: Negative. Disc levels: C2-C3:  Negative. C3-C4: Unchanged shallow disc bulge and mild bilateral uncovertebral hypertrophy. No stenosis. C4-C5: Unchanged shallow disc bulge with superimposed small central disc protrusion. Unchanged minimal bilateral uncovertebral hypertrophy. No stenosis. C5-C6:  Interval disc replacement.  No residual stenosis. C6-C7:  Minimal disc bulging.  No stenosis. C7-T1:  Tiny central disc protrusion.  No stenosis. IMPRESSION: 1. Very mild cervical spondylosis as described above, similar to prior study. No stenosis or impingement. 2. Interval C5-C6 disc replacement. Electronically Signed   By: Titus Dubin M.D.   On: 09/08/2018 08:57    Assessment & Plan:   Larren was seen today for hyperlipidemia, diabetes and annual exam.  Diagnoses and all orders for this visit:  Type 1 diabetes mellitus with hyperglycemia, with long-term current use of insulin ([place])- His A1c is up to 8.1%.  I have asked him to follow-up with his endocrinologist about this. -     Comprehensive metabolic panel; Future -     Microalbumin / creatinine urine ratio; Future -     Hemoglobin A1c; Future -     Cancel: Urinalysis, Routine w reflex microscopic; Future -     HM Diabetes Foot Exam  Benign prostatic hyperplasia without lower urinary tract symptoms- He is symptomatic with this so I have asked him to start taking a peripheral alpha-blocker for symptom relief. -     Cancel: Urinalysis, Routine w reflex microscopic; Future -     alfuzosin (UROXATRAL) 10 MG 24 hr tablet; Take 1 tablet (10 mg total) by mouth daily with breakfast.  Routine general medical examination at a health care facility- Exam completed, labs reviewed, vaccines reviewed, he is not willing to do a colonoscopy at this time due to the COVID-19 pandemic, patient education material was given. -     Lipid panel; Future -  PSA; Future   Vitamin D deficiency- His vitamin D level is in the normal range now. -     VITAMIN D 25 Hydroxy (Vit-D Deficiency, Fractures); Future  Hyperlipidemia with target LDL less than 100- He has achieved his LDL goal and is doing well on the statin. -     TSH; Future  ADHD (attention deficit hyperactivity disorder), inattentive type- At his request, I will change to a once daily formulation of Adderall. -     amphetamine-dextroamphetamine (ADDERALL XR) 20 MG 24 hr capsule; Take 1 capsule (20 mg total) by mouth every morning.  Acute prostatitis- I do not think the prostate gland infection was adequately treated with the recent course of Bactrim.  I have asked him to take a 30-day course of a  fluoroquinolone. -     Urinalysis, Routine w reflex microscopic; Future -     levofloxacin (LEVAQUIN) 750 MG tablet; Take 1 tablet (750 mg total) by mouth daily for 30 days.   I have discontinued Celine Ahr. Dacy's amphetamine-dextroamphetamine and amphetamine-dextroamphetamine. I am also having him start on amphetamine-dextroamphetamine, alfuzosin, and levofloxacin. Additionally, I am having him maintain his Turmeric Curcumin, Calcium-Magnesium-Vitamin D (CALCIUM 500 PO), vitamin C, Alpha-Lipoic Acid, b complex vitamins, losartan, aspirin EC, NON FORMULARY, Bayer Microlet Lancets, glucagon, FreeStyle Libre 14 Day Sensor, Silvadene, glucose blood, Magnesium Oxide, Biotin, atorvastatin, Beclomethasone Dipropionate, pantoprazole, metFORMIN, Vitamin D (Ergocalciferol), Dulaglutide, and traZODone.  Meds ordered this encounter  Medications  . amphetamine-dextroamphetamine (ADDERALL XR) 20 MG 24 hr capsule    Sig: Take 1 capsule (20 mg total) by mouth every morning.    Dispense:  30 capsule    Refill:  0  . alfuzosin (UROXATRAL) 10 MG 24 hr tablet    Sig: Take 1 tablet (10 mg total) by mouth daily with breakfast.    Dispense:  90 tablet    Refill:  1  . levofloxacin (LEVAQUIN) 750 MG tablet    Sig: Take 1 tablet  (750 mg total) by mouth daily for 30 days.    Dispense:  30 tablet    Refill:  0     Follow-up: Return in about 6 months (around 04/19/2019).  Scarlette Calico, MD

## 2018-10-19 ENCOUNTER — Encounter: Payer: Self-pay | Admitting: Internal Medicine

## 2018-10-19 ENCOUNTER — Other Ambulatory Visit (INDEPENDENT_AMBULATORY_CARE_PROVIDER_SITE_OTHER): Payer: 59

## 2018-10-19 DIAGNOSIS — N410 Acute prostatitis: Secondary | ICD-10-CM

## 2018-10-19 DIAGNOSIS — E1065 Type 1 diabetes mellitus with hyperglycemia: Secondary | ICD-10-CM | POA: Diagnosis not present

## 2018-10-19 DIAGNOSIS — Z0000 Encounter for general adult medical examination without abnormal findings: Secondary | ICD-10-CM

## 2018-10-19 DIAGNOSIS — E785 Hyperlipidemia, unspecified: Secondary | ICD-10-CM

## 2018-10-19 DIAGNOSIS — E559 Vitamin D deficiency, unspecified: Secondary | ICD-10-CM

## 2018-10-19 LAB — URINALYSIS, ROUTINE W REFLEX MICROSCOPIC
Bilirubin Urine: NEGATIVE
Hgb urine dipstick: NEGATIVE
Ketones, ur: NEGATIVE
Leukocytes,Ua: NEGATIVE
Nitrite: NEGATIVE
RBC / HPF: NONE SEEN (ref 0–?)
Specific Gravity, Urine: 1.025 (ref 1.000–1.030)
Total Protein, Urine: NEGATIVE
Urine Glucose: NEGATIVE
Urobilinogen, UA: 0.2 (ref 0.0–1.0)
pH: 6 (ref 5.0–8.0)

## 2018-10-19 LAB — COMPREHENSIVE METABOLIC PANEL
ALT: 23 U/L (ref 0–53)
AST: 16 U/L (ref 0–37)
Albumin: 4.4 g/dL (ref 3.5–5.2)
Alkaline Phosphatase: 80 U/L (ref 39–117)
BUN: 12 mg/dL (ref 6–23)
CO2: 24 mEq/L (ref 19–32)
Calcium: 9.5 mg/dL (ref 8.4–10.5)
Chloride: 101 mEq/L (ref 96–112)
Creatinine, Ser: 0.83 mg/dL (ref 0.40–1.50)
GFR: 96 mL/min (ref >60.00)
Glucose, Bld: 122 mg/dL — ABNORMAL HIGH (ref 70–99)
Potassium: 4.3 mEq/L (ref 3.5–5.1)
Sodium: 138 mEq/L (ref 135–145)
Total Bilirubin: 1 mg/dL (ref 0.2–1.2)
Total Protein: 6.8 g/dL (ref 6.0–8.3)

## 2018-10-19 LAB — MICROALBUMIN / CREATININE URINE RATIO
Creatinine,U: 155.1 mg/dL
Microalb Creat Ratio: 0.8 mg/g (ref 0.0–30.0)
Microalb, Ur: 1.3 mg/dL (ref 0.0–1.9)

## 2018-10-19 LAB — LIPID PANEL
Cholesterol: 99 mg/dL (ref 0–200)
HDL: 34.7 mg/dL — ABNORMAL LOW (ref >39.00)
LDL Cholesterol: 41 mg/dL (ref 0–99)
NonHDL: 64.66
Total CHOL/HDL Ratio: 3
Triglycerides: 118 mg/dL (ref 0.0–149.0)
VLDL: 23.6 mg/dL (ref 0.0–40.0)

## 2018-10-19 LAB — HEMOGLOBIN A1C: Hgb A1c MFr Bld: 8.2 % — ABNORMAL HIGH (ref 4.6–6.5)

## 2018-10-19 LAB — TSH: TSH: 2.54 u[IU]/mL (ref 0.35–4.50)

## 2018-10-19 LAB — PSA: PSA: 0.31 ng/mL (ref 0.10–4.00)

## 2018-10-19 LAB — VITAMIN D 25 HYDROXY (VIT D DEFICIENCY, FRACTURES): VITD: 68.22 ng/mL (ref 30.00–100.00)

## 2018-10-19 MED FILL — ADDERALL XR 20 MG CAP SA: 20 | 30 days supply | Qty: 30 | Fill #0

## 2018-10-21 ENCOUNTER — Encounter: Payer: Self-pay | Admitting: Internal Medicine

## 2018-10-21 MED ORDER — LEVOFLOXACIN 750 MG PO TABS: 750.0000 mg | ORAL_TABLET | Freq: Every day | ORAL | 0 refills | Status: AC

## 2018-10-21 MED ORDER — ALFUZOSIN HCL ER 10 MG PO TB24: 10.0000 mg | ORAL_TABLET | Freq: Every day | ORAL | 1 refills | Status: DC

## 2018-10-21 MED FILL — ALFUZOSIN HCL ER 10 MG TAB: 10 | 30 days supply | Qty: 30 | Fill #0

## 2018-10-21 MED FILL — levoFLOXacin 750 MG TABS: 750 | 30 days supply | Qty: 30 | Fill #0

## 2018-10-23 MED FILL — TRULICITY 0.75 MG/0.5 ML PE: 0.75 | 28 days supply | Qty: 2 | Fill #1

## 2018-11-08 ENCOUNTER — Telehealth: Payer: Self-pay

## 2018-11-08 LAB — HM DIABETES EYE EXAM

## 2018-11-08 NOTE — Telephone Encounter (Signed)
LVM to confirm nerve conduction study on 11-09-18

## 2018-11-09 ENCOUNTER — Encounter (INDEPENDENT_AMBULATORY_CARE_PROVIDER_SITE_OTHER): Payer: 59 | Admitting: Diagnostic Neuroimaging

## 2018-11-09 ENCOUNTER — Ambulatory Visit (INDEPENDENT_AMBULATORY_CARE_PROVIDER_SITE_OTHER): Payer: 59 | Admitting: Diagnostic Neuroimaging

## 2018-11-09 ENCOUNTER — Other Ambulatory Visit: Payer: Self-pay

## 2018-11-09 DIAGNOSIS — M79602 Pain in left arm: Secondary | ICD-10-CM | POA: Diagnosis not present

## 2018-11-09 DIAGNOSIS — Z0289 Encounter for other administrative examinations: Secondary | ICD-10-CM

## 2018-11-10 DIAGNOSIS — H524 Presbyopia: Secondary | ICD-10-CM | POA: Diagnosis not present

## 2018-11-13 ENCOUNTER — Other Ambulatory Visit: Payer: Self-pay | Admitting: Internal Medicine

## 2018-11-13 DIAGNOSIS — F900 Attention-deficit hyperactivity disorder, predominantly inattentive type: Secondary | ICD-10-CM

## 2018-11-13 MED FILL — traZODone HCL 50 MG TABS: 50 | 30 days supply | Qty: 30 | Fill #1

## 2018-11-15 MED FILL — FREESTYLE LIBRE 14 DAY SENS: 28 days supply | Qty: 2 | Fill #8

## 2018-11-16 MED FILL — ADDERALL XR 20 MG CAP SA: 20 | 30 days supply | Qty: 30 | Fill #0

## 2018-11-16 NOTE — Procedures (Signed)
[facility]  NCS (NERVE CONDUCTION STUDY) WITH EMG (ELECTROMYOGRAPHY) REPORT   STUDY DATE: 11/09/18 PATIENT NAME: Zachary Graham DOB: 09/14/1960 MRN: [id number]  ORDERING CLINICIAN: Creig Hines  TECHNOLOGIST: Sherre Scarlet ELECTROMYOGRAPHER: Earlean Polka. Eleni Frank, MD  CLINICAL INFORMATION: 58 year old male with left arm pain.  History of cervical spine surgery October 2018.  FINDINGS: NERVE CONDUCTION STUDY:  Bilateral median and ulnar motor responses are normal.  Bilateral median and ulnar sensory responses are normal.  NEEDLE ELECTROMYOGRAPHY:  Needle examination of left upper extremity is normal.   IMPRESSION:   This is a normal study.  No electrodiagnostic evidence of large fiber neuropathy or cervical radiculopathy at this time.    INTERPRETING PHYSICIAN:  Penni Bombard, MD Certified in Neurology, Neurophysiology and Neuroimaging  [facility] [address] [place], [place] [address] [phone]   [facility]    Nerve / Sites Muscle Latency Ref. Amplitude Ref. Rel Amp Segments Distance Velocity Ref. Area    ms ms mV mV %  cm m/s m/s mVms  R Median - APB     Wrist APB 3.6 ?4.4 6.1 ?4.0 100 Wrist - APB 7   22.6     Upper arm APB 8.4  5.4  87.9 Upper arm - Wrist 24 50 ?49 19.0  L Median - APB     Wrist APB 3.6 ?4.4 4.8 ?4.0 100 Wrist - APB 7   15.9     Upper arm APB 8.3  4.5  93.3 Upper arm - Wrist 23 49 ?49 17.5  R Ulnar - ADM     Wrist ADM 3.0 ?3.3 10.6 ?6.0 100 Wrist - ADM 7   30.9     B.Elbow ADM 7.1  10.5  98.9 B.Elbow - Wrist 20 49 ?49 27.9     A.Elbow ADM 9.1  10.2  97.2 A.Elbow - B.Elbow 10 49 ?49 27.6         A.Elbow - Wrist      L Ulnar - ADM     Wrist ADM 2.8 ?3.3 11.8 ?6.0 100 Wrist - ADM 7   32.8     B.Elbow ADM 6.5  10.4  88.3 B.Elbow - Wrist 19 51 ?49 30.8     A.Elbow ADM 8.5  10.1  97.2 A.Elbow - B.Elbow 10 49 ?49 30.2         A.Elbow - Wrist                 SNC    Nerve / Sites Rec. Site Peak Lat  Ref.  Amp Ref. Segments Distance    ms ms V V  cm  R Median - Orthodromic (Dig II, Mid palm)     Dig II Wrist 3.2 ?3.4 17 ?10 Dig II - Wrist 13  L Median - Orthodromic (Dig II, Mid palm)     Dig II Wrist 3.1 ?3.4 11 ?10 Dig II - Wrist 13  R Ulnar - Orthodromic, (Dig V, Mid palm)     Dig V Wrist 2.9 ?3.1 8 ?5 Dig V - Wrist 11  L Ulnar - Orthodromic, (Dig V, Mid palm)     Dig V Wrist 2.9 ?3.1 6 ?5 Dig V - Wrist 68              F  Wave    Nerve F Lat Ref.   ms ms  R Ulnar - ADM 30.6 ?32.0  L Ulnar - ADM 30.7 ?32.0  EMG full       EMG Summary Table    Spontaneous MUAP Recruitment  Muscle IA Fib PSW Fasc Other Amp Dur. Poly Pattern  L. Deltoid Normal None None None _______ Normal Normal Normal Normal  L. Biceps brachii Normal None None None _______ Normal Normal Normal Normal  L. Triceps brachii Normal None None None _______ Normal Normal Normal Normal  L. Flexor carpi radialis Normal None None None _______ Normal Normal Normal Normal  L. First dorsal interosseous Normal None None None _______ Normal Normal Normal Normal

## 2018-11-21 ENCOUNTER — Encounter: Payer: Self-pay | Admitting: Internal Medicine

## 2018-11-23 ENCOUNTER — Encounter: Payer: Self-pay | Admitting: Family Medicine

## 2018-11-24 ENCOUNTER — Encounter: Payer: Self-pay | Admitting: Internal Medicine

## 2018-11-24 ENCOUNTER — Other Ambulatory Visit: Payer: Self-pay | Admitting: Internal Medicine

## 2018-11-24 MED FILL — TRULICITY 0.75 MG/0.5 ML PE: 0.75 | 84 days supply | Qty: 6 | Fill #0

## 2018-12-05 ENCOUNTER — Other Ambulatory Visit: Payer: Self-pay

## 2018-12-06 DIAGNOSIS — Z794 Long term (current) use of insulin: Secondary | ICD-10-CM | POA: Diagnosis not present

## 2018-12-06 DIAGNOSIS — Z9641 Presence of insulin pump (external) (internal): Secondary | ICD-10-CM | POA: Diagnosis not present

## 2018-12-06 DIAGNOSIS — E109 Type 1 diabetes mellitus without complications: Secondary | ICD-10-CM | POA: Diagnosis not present

## 2018-12-06 DIAGNOSIS — E7800 Pure hypercholesterolemia, unspecified: Secondary | ICD-10-CM | POA: Diagnosis not present

## 2018-12-07 ENCOUNTER — Encounter: Payer: Self-pay | Admitting: Internal Medicine

## 2018-12-07 ENCOUNTER — Other Ambulatory Visit: Payer: Self-pay

## 2018-12-07 ENCOUNTER — Ambulatory Visit (INDEPENDENT_AMBULATORY_CARE_PROVIDER_SITE_OTHER): Payer: 59 | Admitting: Internal Medicine

## 2018-12-07 VITALS — BP 130/60 | HR 81 | Ht 70.0 in | Wt 236.0 lb

## 2018-12-07 DIAGNOSIS — E1065 Type 1 diabetes mellitus with hyperglycemia: Secondary | ICD-10-CM

## 2018-12-07 DIAGNOSIS — E785 Hyperlipidemia, unspecified: Secondary | ICD-10-CM | POA: Diagnosis not present

## 2018-12-07 MED ORDER — BAQSIMI ONE PACK 3 MG/DOSE NA POWD: 3.0000 mg | Freq: Once | NASAL | 11 refills | Status: AC

## 2018-12-07 MED ORDER — CONTOUR NEXT TEST VI STRP: ORAL_STRIP | 5 refills | Status: DC

## 2018-12-07 MED ORDER — TRULICITY 1.5 MG/0.5ML ~~LOC~~ SOAJ: 1.5000 mg | SUBCUTANEOUS | 3 refills | Status: DC

## 2018-12-07 NOTE — Patient Instructions (Addendum)
Please increase: - Trulicity to 1.5 mg weekly  Change: - Basal rates: 12 AM: 2.7 4 AM: 2.90 >> 3.0 (maybe even 3.1) 8 AM: 2.80 12 PM: 2.65 4 PM: 2.85 9:00 PM: 2.80 10:30 PM: 2.70 - bolus: - Insulin to carb ratio:  12 AM: 2 11 AM: 2 5 PM: 2 - Insulin sensitivity factor:  13 - target: 100-120 - Active insulin time: 3 hours  Start the boluses 15 minutes before each meal.  Please come back for a follow-up appointment in 3-4 months.

## 2018-12-07 NOTE — Progress Notes (Signed)
Patient ID: Zachary Graham, male   DOB: 1961/05/06, 58 y.o.   MRN: [id number]  HPI: Zachary Graham is a 58 y.o.-year-old male, presenting for f/u for DM1, dx 1993, insulin-dependent, uncontrolled, with complications (mild sensory peripheral neuropathy; hypoglycemia episodes, DR). Last visit 2 months ago (virtual).  He was previously on the [employer], now off.  He started on insulin pump in 2006.  He is on Medtronic 670 G insulin pump with a freestyle CGM.  Humalog in the pump.  Last hemoglobin A1c was: Lab Results  Component Value Date   HGBA1C 8.2 (H) 10/19/2018   HGBA1C 7.7 (A) 05/02/2018   HGBA1C 7.3 (A) 01/05/2018  04/05/2014: HbA1c 7.3%  Pump settings:  - Metformin 1000 mg with dinner >> stopped due to concerns of contamination - Trulicity 6.22 mg weekly - started 09/2018 -he did not call us  to send a higher dose to his pharmacy as advised at last visit (he forgot) -he initially had nausea with it, now resolved - Basal rates (with recent dose changes due to high blood sugars at night): 12 AM: 2.7 4 AM: 2.85 >> 2.9 8 AM: 2.7 >> 2.8 12 PM: 2.65 4 PM: 2.85 9:00 PM: 2.75 >> 2.80 10:30 PM: 2.70 - bolus: - Insulin to carb ratio:  12 AM: 2 11 AM: 2 5 PM: 2 - Insulin sensitivity factor:  13 - target: 100-120 - Active insulin time: 3 hours  TDD basal: 31% >> 47% >> 40% (66 units) >> 45% TDD bolus: 69% >> 53% >> 60% (97 units) >> 55% TDD 122 units >> 165-190 units - Bolus wizard: On - Changes the pump site: every 1-2 days  Pt checks his sugars >4x a day: - ave. 183 +/- 64 >> ave 169+/-55 >> 174  Freestyle libre CGM parameters: - Average: 158 >> 174 - % active CGM time: 97% >> 98% of the time - Glucose variability 31.6% >> 29.8% (target < or = to 36%) - time in range:  - very low (<54): 0% - low (54-69): 1% >> 0% - normal range (70-180): 70% >> 58% - high sugars (181-250): 24% >> 34% - very high sugars (>250): 5% >> 8%  CBG 25% - 75%:  Previously:   He has  a history of a severe hypoglycemia episode in 2015.  She has a non-expired glucagon kit at home. No previous episodes of DKA.  -No CKD, last BUN/creatinine:  Lab Results  Component Value Date   BUN 12 10/19/2018   CREATININE 0.83 10/19/2018  04/11/2018: 10/0.778, ACR 11.1 On Cozaar. -+ HL; last set of lipids: Lab Results  Component Value Date   CHOL 99 10/19/2018   HDL 34.70 (L) 10/19/2018   LDLCALC 41 10/19/2018   LDLDIRECT 87.0 12/02/2016   TRIG 118.0 10/19/2018   CHOLHDL 3 10/19/2018  On Lipitor. - last eye exam was in 10/2018: + Mild NPDR. [facility].  - + numbness and tingling in his feet.  Improved on alpha-lipoic acid and B complex.  He also has a history of GERD, HTN, OSA.  Latest TSH level normal: Lab Results  Component Value Date   TSH 2.54 10/19/2018  04/11/2018: TSH 3.02  Latest vitamin D levels normal:  Lab Results  Component Value Date   VD25OH 68.22 10/19/2018   VD25OH 83.45 10/03/2017   VD25OH 41.95 06/06/2015   VD25OH 35.03 07/17/2014   VD25OH 44 03/01/2013  04/11/2018: vitamin D 46.7.   Previously on ergocalciferol.  He has adhesive capsulitis >> improving.  He is getting occasional steroid injections but we discussed to reduce these as much as possible.  ROS: Constitutional: no weight gain/no weight loss, + fatigue, no subjective hyperthermia, no subjective hypothermia Eyes: no blurry vision, no xerophthalmia ENT: no sore throat, no nodules palpated in neck, no dysphagia, no odynophagia, no hoarseness Cardiovascular: no CP/no SOB/no palpitations/no leg swelling Respiratory: no cough/no SOB/no wheezing Gastrointestinal: no N/no V/no D/no C/no acid reflux Musculoskeletal: no muscle aches/+ joint aches Skin: no rashes, no hair loss Neurological: no tremors/+ numbness/+ tingling/no dizziness  I reviewed pt's medications, allergies, PMH, social hx, family hx, and changes were documented in the history of present illness. Otherwise, unchanged  from my initial visit note.  Past Medical History:  Diagnosis Date  . Diabetes mellitus   . GERD (gastroesophageal reflux disease)   . Heart murmur   . Hyperlipidemia   . Hypertension   . OSA (obstructive sleep apnea)   . Pneumonia    Past Surgical History:  Procedure Laterality Date  . CERVICAL DISC ARTHROPLASTY N/A 03/16/2017   Procedure: CERVICAL FIVE- CERVICAL SIX [place] ARTHROPLASTY;  Surgeon: Jovita Gamma, MD;  Location: [place];  Service: Neurosurgery;  Laterality: N/A;  CERVICAL 5- CERVICAL 6 DISC ARTHROPLASTY  . COLONOSCOPY    . TONSILLECTOMY     Social History   Socioeconomic History  . Marital status: Married    Spouse name: Not on file  . Number of children: 3  . Years of education: Not on file  . Highest education level: Not on file  Occupational History  . Occupation: RADIATION SAFETY OFFICER    Employer: [employer]  Social Needs  . Financial resource strain: Not on file  . Food insecurity    Worry: Not on file    Inability: Not on file  . Transportation needs    Medical: Not on file    Non-medical: Not on file  Tobacco Use  . Smoking status: Never Smoker  . Smokeless tobacco: Never Used  Substance and Sexual Activity  . Alcohol use: No    Alcohol/week: 0.0 standard drinks  . Drug use: No  . Sexual activity: Yes    Partners: Female  Lifestyle  . Physical activity    Days per week: Not on file    Minutes per session: Not on file  . Stress: Not on file  Relationships  . Social Herbalist on phone: Not on file    Gets together: Not on file    Attends religious service: Not on file    Active member of club or organization: Not on file    Attends meetings of clubs or organizations: Not on file    Relationship status: Not on file  . Intimate partner violence    Fear of current or ex partner: Not on file    Emotionally abused: Not on file    Physically abused: Not on file    Forced sexual activity: Not on file  Other Topics Concern   . Not on file  Social History Narrative   Regular exercise: runs 3 miles 3x week, takes one day off per week to rest   Caffeine use: stopped all diet soda on 04/23/15 per advice from Sports MD   Current Outpatient Medications on File Prior to Visit  Medication Sig Dispense Refill  . ADDERALL XR 20 MG 24 hr capsule TAKE 1 CAPSULE BY MOUTH EVERY MORNING 30 capsule 0  . alfuzosin (UROXATRAL) 10 MG 24 hr tablet Take 1 tablet (  10 mg total) by mouth daily with breakfast. 90 tablet 1  . Alpha-Lipoic Acid 600 MG CAPS Take 1 capsule by mouth daily.     . Ascorbic Acid (VITAMIN C) 1000 MG tablet Take 1,000 mg by mouth daily.    Marland Kitchen aspirin EC 81 MG tablet Take 81 mg by mouth daily.    Marland Kitchen atorvastatin (LIPITOR) 80 MG tablet Take 80 mg by mouth daily.    Marland Kitchen b complex vitamins tablet Take 1 tablet by mouth daily.    Marland Kitchen BAYER MICROLET LANCETS lancets USE TO TEST BLOOD SUGAR 6 TIMES A DAY 300 each 11  . Beclomethasone Dipropionate (QNASL) 80 MCG/ACT AERS Place 2 puffs into the nose daily as needed (allergies). 10.6 g 5  . Biotin 5000 MCG CAPS Take by mouth.    . Calcium-Magnesium-Vitamin D (CALCIUM 500 PO) Take 1 tablet by mouth daily.     . Continuous Blood Gluc Sensor (FREESTYLE LIBRE 14 DAY SENSOR) MISC 1 each by Does not apply route every 14 (fourteen) days. Change every 2 weeks 2 each 11  . glucagon (GLUCAGON EMERGENCY) 1 MG injection Inject 1 mg into the muscle once as needed. 2 each prn  . glucose blood (CONTOUR NEXT TEST) test strip USE AS INSTRUCTED TO CHECK SUGAR 6 TIMES DAILY 400 each 5  . losartan (COZAAR) 50 MG tablet TAKE 1 TABLET BY MOUTH ONCE DAILY (Patient taking differently: TAKE 50 mg BY MOUTH ONCE DAILY) 90 tablet 1  . Magnesium Oxide 400 MG CAPS Take 1 capsule (400 mg total) by mouth daily. 90 capsule 1  . metFORMIN (GLUCOPHAGE) 500 MG tablet TAKE 2 TABLETS BY MOUTH DAILY WITH SUPPER 180 tablet 1  . Misc Natural Products (TURMERIC CURCUMIN) CAPS Take 1 capsule by mouth daily.     . NON  FORMULARY CPAP    . pantoprazole (PROTONIX) 40 MG tablet TAKE 1 TABLET BY MOUTH EVERY DAILY 90 tablet 1  . SILVADENE 1 % cream APPLY 1 APPLICATION TOPICALLY DAILY. 50 g 0  . traZODone (DESYREL) 50 MG tablet TAKE 1/2 - 1 TABLET (25-50 MG TOTAL) BY MOUTH AT BEDTIME AS NEEDED FOR SLEEP. 30 tablet 3  . TRULICITY 0.10 UV/2.5DG SOPN INJECT 0.75 MG IN AM WEEKLY UNDER SKIN 2 mL 4  . Vitamin D, Ergocalciferol, (DRISDOL) 1.25 MG (50000 UT) CAPS capsule TAKE 1 CAPSULE BY MOUTH EVERY 7 DAYS. 12 capsule 0   No current facility-administered medications on file prior to visit.    Allergies  Allergen Reactions  . Lisinopril Swelling and Other (See Comments)    Extreme facial swelling causing hospitalization  . Shellfish Allergy Anaphylaxis  . Zetia [Ezetimibe] Other (See Comments)    ABDOMINAL CRAMPING  . Penicillins Other (See Comments)    UNSPECIFIED REACTION > Childhood allergy    . Cymbalta [Duloxetine Hcl] Other (See Comments)    Severe feet cramps   Family History  Problem Relation Age of Onset  . Colon cancer Maternal Grandfather   . Colon cancer Paternal Grandfather   . Heart disease Father        Valve replacement; pacemaker  . Alcohol abuse Neg Hx   . Asthma Neg Hx   . Diabetes Neg Hx   . Hyperlipidemia Neg Hx   . Hypertension Neg Hx   . Kidney disease Neg Hx   . Stroke Neg Hx     PE: BP 130/60   Pulse 81   Ht 5' 10"  (1.778 m)   Wt 236 lb (107 kg)  SpO2 97%   BMI 33.86 kg/m  Body mass index is 33.86 kg/m.  Wt Readings from Last 3 Encounters:  12/07/18 236 lb (107 kg)  10/18/18 238 lb (108 kg)  07/20/18 238 lb (108 kg)   Constitutional: overweight, in NAD Eyes: PERRLA, EOMI, no exophthalmos ENT: moist mucous membranes, no thyromegaly, no cervical lymphadenopathy Cardiovascular: RRR, No MRG Respiratory: CTA B Gastrointestinal: abdomen soft, NT, ND, BS+ Musculoskeletal: no deformities, strength intact in all 4 Skin: moist, warm, no rashes Neurological: no tremor  with outstretched hands, DTR normal in all 4  ASSESSMENT: 1. DM1, insulin-dependent, uncontrolled, without complications - DR - PN  -Insulin pump -Was on a Dexcom CGM and previously on Enlite CGM, which found less accurate >> retried this Summer 2017 >> still poor accuracy.  Currently on freestyle libre CGM.  - Barriers to good control:  Very busy schedule >> less time to check sugars and eat but But he does a great job with bolusing during the day >> still has a lot of large boluses during the day, sometimes without documented carbs or sugars  Lack of sleep >> usually sleeps max 5h a night (!) and tries to catch up in the weekend  Reaches home late at night  - sometimes at 1 am >> eats >65% of the daily calories then  2. HL  PLAN:  1. Patient with longstanding, uncontrolled, type 1 diabetes, with high insulin resistance, with slightly better control at this visit.  I saw him 2 months ago via a virtual appointment and at that time, we discussed about adding a GLP-1 receptor agonist.  We added Trulicity low-dose.  He had nausea with it initially but he is not tolerating it well.  He forgot to call me so that I can send the higher dose of Trulicity to his pharmacy so he is now on the low dose, 0.75 mg weekly.  We did not change his regimen otherwise at last visit. -At this visit, reviewing his freestyle libre CGM and also his pump downloads, it appears that his sugars are improved but they continue to be high at night.  He does not appear to have significant increases in his sugars after meals, most likely due to the addition of Trulicity.  At this visit we discussed about increasing the Trulicity further since this was also have an impact on nighttime sugars.  He is usually eating late. -However, since his sugars are still high in the second half of the night, will also increase his basal rates from 4 AM to 8 AM further. -As of now, he is getting 45% of his total insulin dose from basal rates  and 55% thrombosis, which I feel is a good ratio for him. -He did not have significant lows since last visit and whenever he has a low enough to check with his meter.  I refilled his strips today. -Today we also discussed about the new inhaled glucagon (Baqsimi) and I sent this to his pharmacy.  His glucagon pen is due to expire next month and I advised him to let me know if he cannot get the inhaled glucagon so I can send a new glucagon kit. -We again discussed about his steroid injections.  He was able to avoid them in the last 7 months.  He is aware that these greatly increase his blood sugars and that he needs to change his insulin to carb ratio whenever he gets an injection.  Currently, he is using a very strict insulin  to carb ratio of 1: 2.  We will not change this today. - I advised him to:  Patient Instructions  Please increase: - Trulicity to 1.5 mg weekly  Change: - Basal rates: 12 AM: 2.7 4 AM: 2.90 >> 3.0 (maybe even 3.1) 8 AM: 2.80 12 PM: 2.65 4 PM: 2.85 9:00 PM: 2.80 10:30 PM: 2.70 - bolus: - Insulin to carb ratio:  12 AM: 2 11 AM: 2 5 PM: 2 - Insulin sensitivity factor:  13 - target: 100-120 - Active insulin time: 3 hours  Start the boluses 15 minutes before each meal.  Please come back for a follow-up appointment in 3-4 months.  - we checked his HbA1c: 7.6% (improved) - advised to check sugars at different times of the day - 4x a day, rotating check times - advised for yearly eye exams >> he is UTD - return to clinic in 3-4 months  2. HL - Reviewed latest lipid panel from 10/2018: LDL at goal, excellent, HDL slightly low Lab Results  Component Value Date   CHOL 99 10/19/2018   HDL 34.70 (L) 10/19/2018   LDLCALC 41 10/19/2018   LDLDIRECT 87.0 12/02/2016   TRIG 118.0 10/19/2018   CHOLHDL 3 10/19/2018  - Continues the statin without side effects.   - time spent with the patient: 40 min, of which >50% was spent in reviewing his pump and CGM downloads,  discussing his hypo- and hyper-glycemic episodes, reviewing previous labs and pump settings and developing a plan to avoid hypo- and hyper-glycemia.    Philemon Kingdom, MD PhD [facility] End[facility]

## 2018-12-08 LAB — POCT GLYCOSYLATED HEMOGLOBIN (HGB A1C): Hemoglobin A1C: 7.6 % — AB (ref 4.0–5.6)

## 2018-12-08 MED FILL — FREESTYLE LIBRE 14 DAY SENS: 28 days supply | Qty: 2 | Fill #9

## 2018-12-08 MED FILL — traZODone HCL 50 MG TABS: 50 | 30 days supply | Qty: 30 | Fill #2

## 2018-12-08 NOTE — Addendum Note (Signed)
Addended by: Cardell Peach I on: 12/08/2018 08:18 AM   Modules accepted: Orders

## 2018-12-18 ENCOUNTER — Other Ambulatory Visit: Payer: Self-pay | Admitting: Family Medicine

## 2018-12-18 ENCOUNTER — Other Ambulatory Visit: Payer: Self-pay | Admitting: Internal Medicine

## 2018-12-18 DIAGNOSIS — F900 Attention-deficit hyperactivity disorder, predominantly inattentive type: Secondary | ICD-10-CM

## 2018-12-18 MED FILL — PANTOPRAZOLE SOD DR 40 MG T: 40 | 90 days supply | Qty: 90 | Fill #1

## 2018-12-18 MED FILL — metFORMIN HCL 500 MG TABS: 500 | 90 days supply | Qty: 180 | Fill #0

## 2018-12-18 MED FILL — ADDERALL XR 20 MG CAP SA: 20 | 30 days supply | Qty: 30 | Fill #0

## 2018-12-18 MED FILL — VIT D2 1.25 MG (50,000 UNIT: 1.25 MG | 84 days supply | Qty: 12 | Fill #0

## 2018-12-27 MED FILL — CONTOUR NEXT STRIPS: 66 days supply | Qty: 400 | Fill #2

## 2018-12-30 MED FILL — FREESTYLE LIBRE 14 DAY SENS: 28 days supply | Qty: 2 | Fill #10

## 2019-01-11 ENCOUNTER — Other Ambulatory Visit: Payer: Self-pay | Admitting: Internal Medicine

## 2019-01-11 DIAGNOSIS — F900 Attention-deficit hyperactivity disorder, predominantly inattentive type: Secondary | ICD-10-CM

## 2019-01-11 MED FILL — traZODone HCL 50 MG TABS: 50 | 30 days supply | Qty: 30 | Fill #3

## 2019-01-12 MED FILL — FREESTYLE LIBRE 14 DAY SENS: 28 days supply | Qty: 2 | Fill #0

## 2019-01-15 MED FILL — ADDERALL XR 20 MG CAP SA: 20 | 30 days supply | Qty: 30 | Fill #0

## 2019-01-18 DIAGNOSIS — G4733 Obstructive sleep apnea (adult) (pediatric): Secondary | ICD-10-CM | POA: Diagnosis not present

## 2019-01-24 ENCOUNTER — Other Ambulatory Visit: Payer: Self-pay

## 2019-01-24 MED ORDER — TRULICITY 1.5 MG/0.5ML ~~LOC~~ SOAJ: 1.5000 mg | SUBCUTANEOUS | 3 refills | Status: DC

## 2019-01-24 MED FILL — TRULICITY 1.5 MG/0.5 ML PEN: 1.5 | 84 days supply | Qty: 6 | Fill #0

## 2019-02-16 ENCOUNTER — Other Ambulatory Visit: Payer: Self-pay | Admitting: Internal Medicine

## 2019-02-16 DIAGNOSIS — R300 Dysuria: Secondary | ICD-10-CM | POA: Diagnosis not present

## 2019-02-16 DIAGNOSIS — F900 Attention-deficit hyperactivity disorder, predominantly inattentive type: Secondary | ICD-10-CM

## 2019-02-16 DIAGNOSIS — I10 Essential (primary) hypertension: Secondary | ICD-10-CM | POA: Diagnosis not present

## 2019-02-16 DIAGNOSIS — E785 Hyperlipidemia, unspecified: Secondary | ICD-10-CM | POA: Diagnosis not present

## 2019-02-16 MED FILL — FREESTYLE LIBRE 14 DAY SENS: 28 days supply | Qty: 2 | Fill #1

## 2019-02-16 MED FILL — traZODone HCL 50 MG TABS: 50 | 30 days supply | Qty: 30 | Fill #0

## 2019-02-16 NOTE — Telephone Encounter (Signed)
Per database, last filled 01/15/2019. LOV with PCP was 10/18/2018

## 2019-02-19 MED FILL — ADDERALL XR 20 MG CAP SA: 20 | 30 days supply | Qty: 30 | Fill #0

## 2019-02-25 ENCOUNTER — Encounter: Payer: Self-pay | Admitting: Internal Medicine

## 2019-02-28 ENCOUNTER — Encounter: Payer: Self-pay | Admitting: Internal Medicine

## 2019-03-12 ENCOUNTER — Ambulatory Visit: Payer: 59 | Admitting: Internal Medicine

## 2019-03-12 ENCOUNTER — Encounter: Payer: Self-pay | Admitting: Internal Medicine

## 2019-03-14 ENCOUNTER — Ambulatory Visit: Payer: 59 | Admitting: Internal Medicine

## 2019-03-14 ENCOUNTER — Encounter: Payer: Self-pay | Admitting: Family Medicine

## 2019-03-15 ENCOUNTER — Other Ambulatory Visit: Payer: Self-pay | Admitting: Internal Medicine

## 2019-03-15 DIAGNOSIS — F900 Attention-deficit hyperactivity disorder, predominantly inattentive type: Secondary | ICD-10-CM

## 2019-03-15 MED FILL — traZODone HCL 50 MG TABS: 50 | 30 days supply | Qty: 30 | Fill #1

## 2019-03-16 NOTE — Telephone Encounter (Signed)
Per database, rx was last filled on 02/18/2019

## 2019-03-19 MED FILL — ADDERALL XR 20 MG CAP SA: 20 | 30 days supply | Qty: 30 | Fill #0

## 2019-03-21 ENCOUNTER — Other Ambulatory Visit: Payer: Self-pay

## 2019-03-21 ENCOUNTER — Ambulatory Visit (INDEPENDENT_AMBULATORY_CARE_PROVIDER_SITE_OTHER): Payer: 59 | Admitting: Family Medicine

## 2019-03-21 ENCOUNTER — Encounter: Payer: Self-pay | Admitting: Family Medicine

## 2019-03-21 ENCOUNTER — Ambulatory Visit: Payer: Self-pay

## 2019-03-21 VITALS — BP 130/70 | HR 79 | Ht 70.0 in | Wt 239.0 lb

## 2019-03-21 DIAGNOSIS — M25562 Pain in left knee: Secondary | ICD-10-CM

## 2019-03-21 DIAGNOSIS — S76312A Strain of muscle, fascia and tendon of the posterior muscle group at thigh level, left thigh, initial encounter: Secondary | ICD-10-CM | POA: Diagnosis not present

## 2019-03-21 NOTE — Assessment & Plan Note (Signed)
Left hamstring strain, discussed icing regimen and home exercise, discussed which activities doing which wants to avoid.  Patient is going to do more the compression, home exercises, decreased stride length, follow-up with me again in 4 to 8 weeks

## 2019-03-21 NOTE — Progress Notes (Signed)
[facility] Zachary Graham, [place] [address] Phone: [phone] Subjective:   I Zachary Graham am serving as a Education administrator for Dr. Hulan Saas.   CC: Left knee and shoulder pain follow-up  [id number]   07/20/2018 Frozen shoulder.  4 months since last injection.  Patient has done formal physical therapy and improved range of motion minorly.  Patient states that the pain is completely free.  Patient also is in stage III frozen shoulder and hopefully the range of motion will improve.  Patient given different choices including MRI, injection, or repeating formal physical therapy or even referral to orthopedic surgery for manipulation of the adhesions.  Patient has elected to try conservative therapy for another 6 weeks.  Patient will come back at that time and then we will consider injection.  03/21/2019 Zachary Graham is a 58 y.o. male coming in with complaint of left knee and shoulder pain. Shoulder is not 100% but better than before. On the treadmill last week when he felt a pop. Sharp pain on the medial side of his knee. Pain with weight bearing. Activity level has decreased and he believes that is why his knee is hurting and believes his medications might have something to do with it as well. Patient also states he feels aches everywhere.   Onset- 1 week ago  Location - medial knee Character- sharp pain  Aggravating factors- walking downhill, downstairs, extension  Therapies tried- Tumeric  Severity-  5-6/10     Past Medical History:  Diagnosis Date  . Diabetes mellitus   . GERD (gastroesophageal reflux disease)   . Heart murmur   . Hyperlipidemia   . Hypertension   . OSA (obstructive sleep apnea)   . Pneumonia    Past Surgical History:  Procedure Laterality Date  . CERVICAL DISC ARTHROPLASTY N/A 03/16/2017   Procedure: CERVICAL FIVE- CERVICAL SIX [place] ARTHROPLASTY;  Surgeon: Jovita Gamma, MD;  Location: [place];  Service: Neurosurgery;   Laterality: N/A;  CERVICAL 5- CERVICAL 6 DISC ARTHROPLASTY  . COLONOSCOPY    . TONSILLECTOMY     Social History   Socioeconomic History  . Marital status: Married    Spouse name: Not on file  . Number of children: 3  . Years of education: Not on file  . Highest education level: Not on file  Occupational History  . Occupation: RADIATION SAFETY OFFICER    Employer: [employer]  Social Needs  . Financial resource strain: Not on file  . Food insecurity    Worry: Not on file    Inability: Not on file  . Transportation needs    Medical: Not on file    Non-medical: Not on file  Tobacco Use  . Smoking status: Never Smoker  . Smokeless tobacco: Never Used  Substance and Sexual Activity  . Alcohol use: No    Alcohol/week: 0.0 standard drinks  . Drug use: No  . Sexual activity: Yes    Partners: Female  Lifestyle  . Physical activity    Days per week: Not on file    Minutes per session: Not on file  . Stress: Not on file  Relationships  . Social Herbalist on phone: Not on file    Gets together: Not on file    Attends religious service: Not on file    Active member of club or organization: Not on file    Attends meetings of clubs or organizations: Not on file  Relationship status: Not on file  Other Topics Concern  . Not on file  Social History Narrative   Regular exercise: runs 3 miles 3x week, takes one day off per week to rest   Caffeine use: stopped all diet soda on 04/23/15 per advice from Sports MD   Allergies  Allergen Reactions  . Lisinopril Swelling and Other (See Comments)    Extreme facial swelling causing hospitalization  . Shellfish Allergy Anaphylaxis  . Zetia [Ezetimibe] Other (See Comments)    ABDOMINAL CRAMPING  . Penicillins Other (See Comments)    UNSPECIFIED REACTION > Childhood allergy    . Cymbalta [Duloxetine Hcl] Other (See Comments)    Severe feet cramps   Family History  Problem Relation Age of Onset  . Colon cancer Maternal  Grandfather   . Colon cancer Paternal Grandfather   . Heart disease Father        Valve replacement; pacemaker  . Alcohol abuse Neg Hx   . Asthma Neg Hx   . Diabetes Neg Hx   . Hyperlipidemia Neg Hx   . Hypertension Neg Hx   . Kidney disease Neg Hx   . Stroke Neg Hx     Current Outpatient Medications (Endocrine & Metabolic):  .  glucagon (GLUCAGON EMERGENCY) 1 MG injection, Inject 1 mg into the muscle once as needed. .  metFORMIN (GLUCOPHAGE) 500 MG tablet, TAKE 2 TABLETS BY MOUTH DAILY WITH SUPPER  Current Outpatient Medications (Cardiovascular):  .  atorvastatin (LIPITOR) 80 MG tablet, Take 80 mg by mouth daily. Marland Kitchen  losartan (COZAAR) 50 MG tablet, TAKE 1 TABLET BY MOUTH ONCE DAILY (Patient taking differently: TAKE 50 mg BY MOUTH ONCE DAILY)  Current Outpatient Medications (Respiratory):  Marland Kitchen  Beclomethasone Dipropionate (QNASL) 80 MCG/ACT AERS, Place 2 puffs into the nose daily as needed (allergies).  Current Outpatient Medications (Analgesics):  .  aspirin EC 81 MG tablet, Take 81 mg by mouth daily.   Current Outpatient Medications (Other):  Marland Kitchen  ADDERALL XR 20 MG 24 hr capsule, TAKE 1 CAPSULE BY MOUTH EVERY MORNING .  alfuzosin (UROXATRAL) 10 MG 24 hr tablet, Take 1 tablet (10 mg total) by mouth daily with breakfast. .  Alpha-Lipoic Acid 600 MG CAPS, Take 1 capsule by mouth daily.  .  Ascorbic Acid (VITAMIN C) 1000 MG tablet, Take 1,000 mg by mouth daily. Marland Kitchen  b complex vitamins tablet, Take 1 tablet by mouth daily. Marland Kitchen  BAYER MICROLET LANCETS lancets, USE TO TEST BLOOD SUGAR 6 TIMES A DAY .  Biotin 5000 MCG CAPS, Take by mouth. .  Calcium-Magnesium-Vitamin D (CALCIUM 500 PO), Take 1 tablet by mouth daily.  .  Continuous Blood Gluc Sensor (FREESTYLE LIBRE 14 DAY SENSOR) MISC, APPLY AS DIRECTED EVERY 14 (FOURTEEN) DAYS. CHANGE EVERY 2 WEEKS .  glucose blood (CONTOUR NEXT TEST) test strip, USE AS INSTRUCTED TO CHECK SUGAR 6 TIMES DAILY .  Magnesium Oxide 400 MG CAPS, Take 1 capsule  (400 mg total) by mouth daily. .  Misc Natural Products (TURMERIC CURCUMIN) CAPS, Take 1 capsule by mouth daily.  .  NON FORMULARY, CPAP .  pantoprazole (PROTONIX) 40 MG tablet, TAKE 1 TABLET BY MOUTH EVERY DAILY .  SILVADENE 1 % cream, APPLY 1 APPLICATION TOPICALLY DAILY. .  traZODone (DESYREL) 50 MG tablet, TAKE 1/2 - 1 TABLET (25-50 MG TOTAL) BY MOUTH AT BEDTIME AS NEEDED FOR SLEEP. .  Vitamin D, Ergocalciferol, (DRISDOL) 1.25 MG (50000 UT) CAPS capsule, TAKE 1 CAPSULE BY MOUTH EVERY 7 DAYS.  Past medical history, social, surgical and family history all reviewed in electronic medical record.  No pertanent information unless stated regarding to the chief complaint.   Review of Systems:  No headache, visual changes, nausea, vomiting, diarrhea, constipation, dizziness, abdominal pain, skin rash, fevers, chills, night sweats, weight loss, swollen lymph nodes, body aches, joint swelling,  chest pain, shortness of breath, mood changes. Positive muscle aches Objective  Blood pressure 130/70, pulse 79, height 5\' 10"  (1.778 m), weight 239 lb (108.4 kg), SpO2 98 %.   General: No apparent distress alert and oriented x3 mood and affect normal, dressed appropriately.  Mildly overweight HEENT: Pupils equal, extraocular movements intact  Respiratory: Patient's speak in full sentences and does not appear short of breath  Cardiovascular: No lower extremity edema, non tender, no erythema  Skin: Warm dry intact with no signs of infection or rash on extremities or on axial skeleton.  Abdomen: Soft nontender  Neuro: Cranial nerves II through XII are intact, neurovascularly intact in all extremities with 2+ DTRs and 2+ pulses.  Lymph: No lymphadenopathy of posterior or anterior cervical chain or axillae bilaterally.  Gait normal with good balance and coordination.  MSK:  tender with limited range of motion and good stability and symmetric strength and tone of , elbows, wrist, hip and ankles bilaterally.    Patient is in the medial aspect the left side does have some mild tenderness noted.  Worse with full extension.  Seems to be more over the peds anserine area.  Negative McMurray's.  No significant crepitus.  Negative patellar grind.  Good stability of the knee  Contralateral knee unremarkable  Left shoulder still has unfortunately decreased range of motion lacking the last 15 degrees of forward flexion, external rotation of 5 degrees still noted.  Limited musculoskeletal ultrasound was performed and interpreted by [name]  Limited ultrasound of patient's right knee shows the patient does have some chronic hypoechoic changes patient does not have any mature tearing or retraction noted.  Patient's meniscus appears to be unremarkable.  No swelling within the joint Impression: Hamstring strain    Impression and Recommendations:     This case required medical decision making of moderate complexity. The above documentation has been reviewed and is accurate and complete [name], DO       Note: This dictation was prepared with Dragon dictation along with smaller phrase technology. Any transcriptional errors that result from this process are unintentional.

## 2019-03-21 NOTE — Patient Instructions (Signed)
Thigh compression Shorten stride with running [place] small amount on most painful spot 2 times a day See me again in 6 weeks

## 2019-03-23 MED FILL — CONTOUR NEXT STRIPS: 66 days supply | Qty: 400 | Fill #3

## 2019-04-04 ENCOUNTER — Other Ambulatory Visit: Payer: Self-pay

## 2019-04-06 ENCOUNTER — Encounter: Payer: Self-pay | Admitting: Internal Medicine

## 2019-04-06 ENCOUNTER — Ambulatory Visit (INDEPENDENT_AMBULATORY_CARE_PROVIDER_SITE_OTHER): Payer: 59 | Admitting: Internal Medicine

## 2019-04-06 VITALS — BP 138/70 | HR 87 | Ht 70.0 in | Wt 243.0 lb

## 2019-04-06 DIAGNOSIS — E1065 Type 1 diabetes mellitus with hyperglycemia: Secondary | ICD-10-CM | POA: Diagnosis not present

## 2019-04-06 DIAGNOSIS — E782 Mixed hyperlipidemia: Secondary | ICD-10-CM | POA: Diagnosis not present

## 2019-04-06 LAB — POCT GLYCOSYLATED HEMOGLOBIN (HGB A1C): Hemoglobin A1C: 7.4 % — AB (ref 4.0–5.6)

## 2019-04-06 IMAGING — DX DG SHOULDER 2+V*L*
3 series · 3 of 3 positions shown · non-contrast
Comparison: None.

CLINICAL DATA: Frozen shoulder for several months, initial
encounter

EXAM:
LEFT SHOULDER - 2+ VIEW

[grashey]
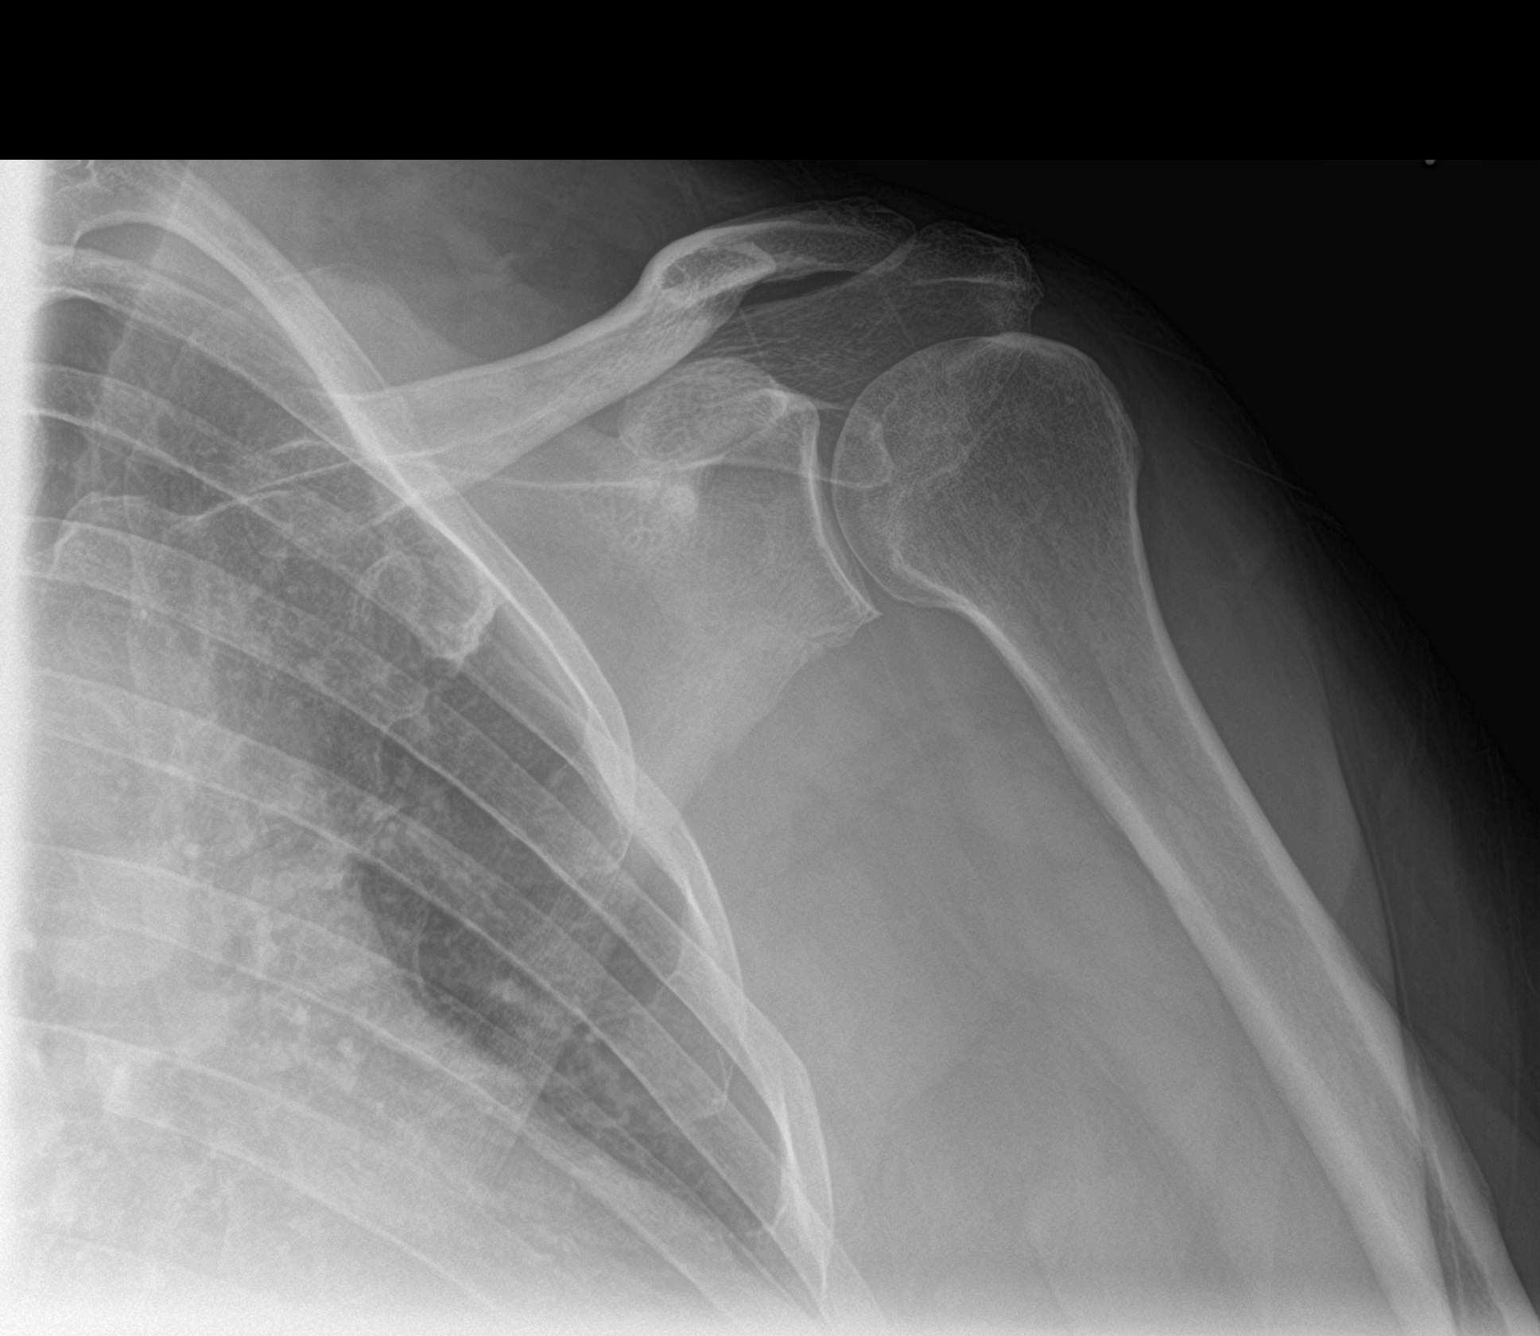

[y view]
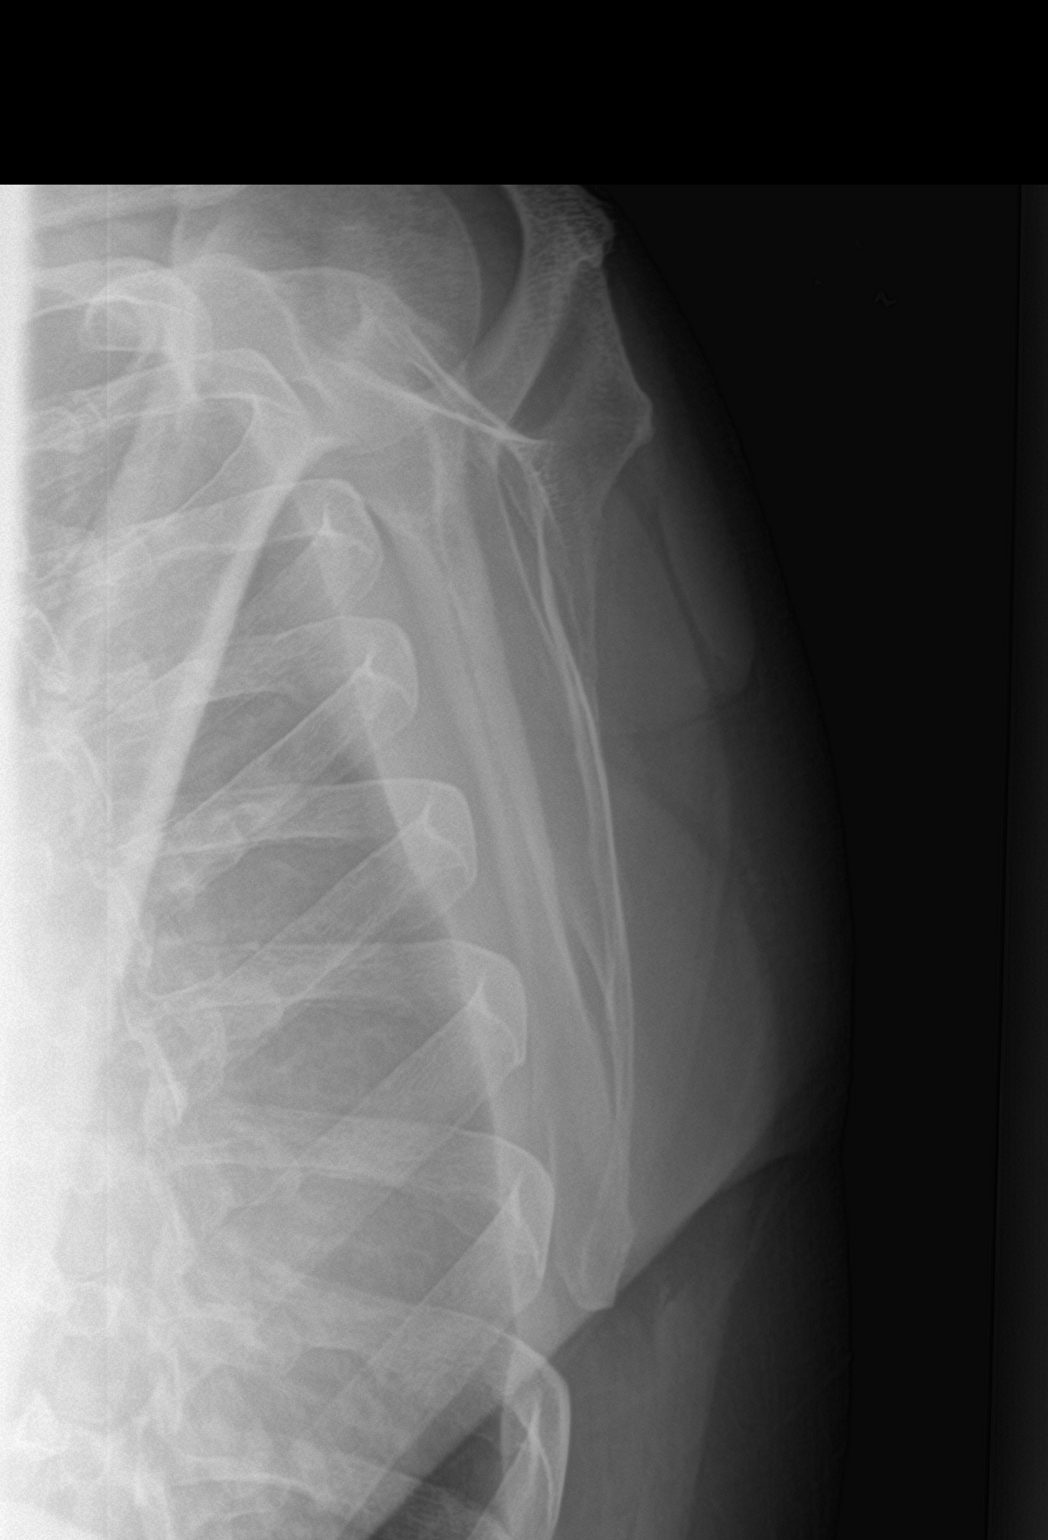

[shoulder axial]
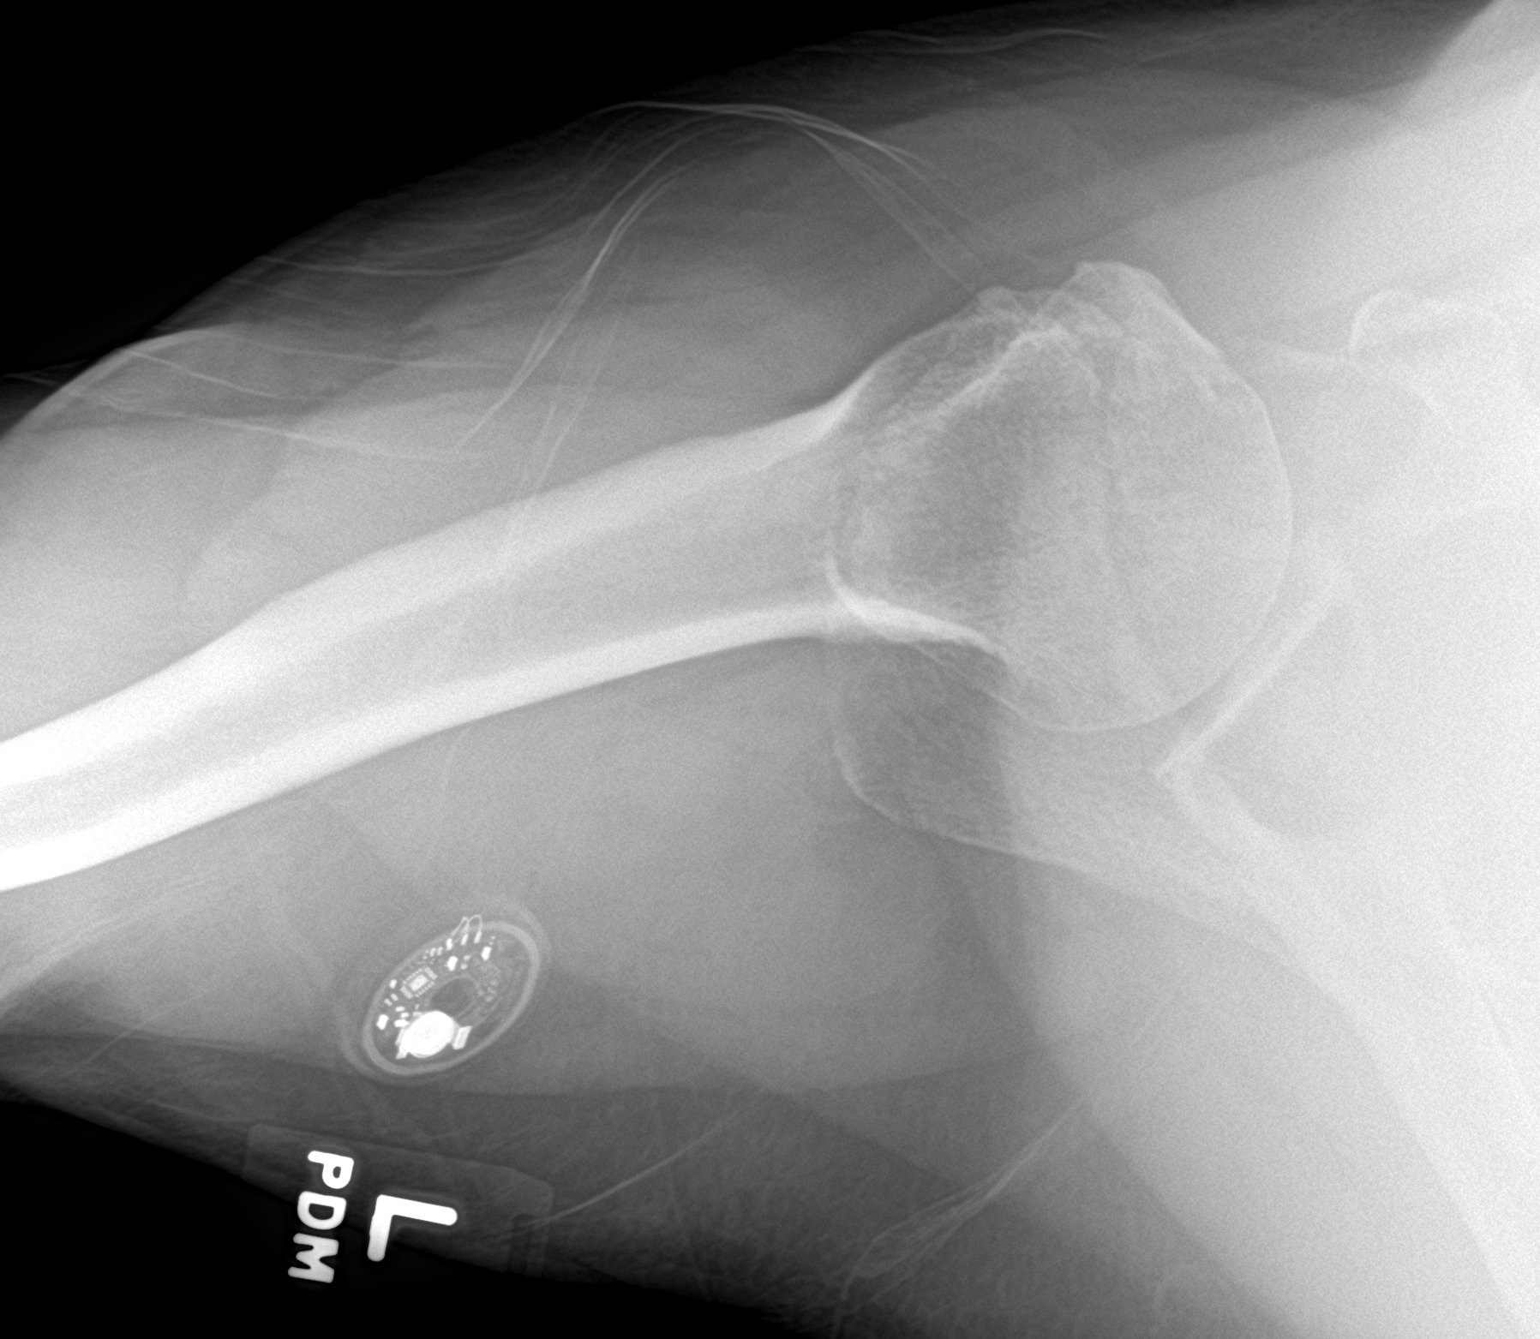

[3 of 3 positions shown; findings below may reference images not displayed]

FINDINGS: There is no evidence of fracture or dislocation. There is no
evidence of arthropathy or other focal bone abnormality. Soft
tissues are unremarkable.
IMPRESSION: No acute abnormality noted.

## 2019-04-06 MED FILL — metFORMIN HCL 500 MG TABS: 500 | 90 days supply | Qty: 180 | Fill #1

## 2019-04-06 NOTE — Progress Notes (Signed)
Patient ID: Zachary Graham, male   DOB: 1960-11-30, 58 y.o.   MRN: [id number]   This visit occurred during the SARS-CoV-2 public health emergency.  Safety protocols were in place, including screening questions prior to the visit, additional usage of staff PPE, and extensive cleaning of exam room while observing appropriate contact time as indicated for disinfecting solutions.   HPI: Zachary Graham is a 58 y.o.-year-old male, presenting for f/u for DM1, dx 1993, insulin-dependent, uncontrolled, with complications (mild sensory peripheral neuropathy; hypoglycemia episodes, DR). Last visit 4 months ago (virtual).  He started on insulin pump in 2006.  He is on Medtronic 670 G insulin pump with a Dexcom G6 CGM now (changed from a Freestyle Libre CGM since last visit).  He has Humalog in the pump.  Reviewed HbA1c levels: Lab Results  Component Value Date   HGBA1C 7.6 (A) 12/08/2018   HGBA1C 8.2 (H) 10/19/2018   HGBA1C 7.7 (A) 05/02/2018   HGBA1C 7.3 (A) 01/05/2018   HGBA1C 8.1 (A) 10/03/2017   HGBA1C 7.6 03/10/2017   HGBA1C 9.7 (H) 12/02/2016   HGBA1C 7.3 04/22/2016   HGBA1C 7.8 02/17/2016   HGBA1C 8.0 11/13/2015   HGBA1C 7.8 (H) 10/14/2015   HGBA1C 8.1 05/30/2015   HGBA1C 7.6 01/30/2015   HGBA1C 7.6 (H) 11/14/2014   HGBA1C 8.2 (H) 07/17/2014   HGBA1C 8.1 (H) 11/20/2013   HGBA1C 9.0 (H) 07/25/2013   HGBA1C 8.7 (H) 02/05/2013   HGBA1C 8.2 (H) 09/22/2012   HGBA1C 7.4 (H) 05/26/2012   04/05/2014: HbA1c 7.3%  Pump settings:  - Metformin 1000 mg 2x a day  -  - started 09/2018 -but stopped 02/2019 due to nausea/abdominal pain - Basal rates: 12 AM: 2.8 4 AM: 3.1 8 AM: 2.85 12 PM: 2.70 4 PM: 2.95 9:00 PM: 2.85 10:30 PM: 2.80 - bolus: - Insulin to carb ratio:  12 AM: 2 11 AM: 2 5 PM: 2 - Insulin sensitivity factor:  13 - target: 100-120 - Active insulin time: 3 hours  TDD basal: 31% >> 47% >> 40% (66 units) >> 45% >> 39% (68 units) TDD bolus: 69% >> 53% >> 60% (97 units) >> 55%  >> 61% (170 units) TDD 122 units >> 175-200 units - Bolus wizard: On - Changes the pump site: every 1-2 days  He checks his sugars more than 4 times a day: - ave. 183 +/- 64 >> ave 169+/-55 >> 174 >> 173:  CGM parameters: - Average: 158 >> 174 >> 173 +/- 52 - % active CGM time: 97% >> 98% >> 99.2% of the time - Glucose variability 31.6% >> 29.8% >> 30.3% (target < or = to 36%) - time in range:  - very low (<54): 0% >> 0% - low (54-69): 1% >> 0% >> 1% - normal range (70-180): 70% >> 58% >> 57.6% - high sugars (181-250): 24% >> 34% >> 41.4% - very high sugars (>250): 5% >> 8% >> 0% >> 5.8%   Previously:   She has a history of a severe hypoglycemia episode in 2015.Marland Kitchen  She has a nonexpired glucagon kit at home. No previous episodes of DKA.  -No CKD, last BUN/creatinine:  Lab Results  Component Value Date   BUN 12 10/19/2018   CREATININE 0.83 10/19/2018  04/11/2018: 10/0.778, ACR 11.1 On Cozaar. -+ HL; last set of lipids: Lab Results  Component Value Date   CHOL 99 10/19/2018   HDL 34.70 (L) 10/19/2018   LDLCALC 41 10/19/2018   LDLDIRECT 87.0 12/02/2016  TRIG 118.0 10/19/2018   CHOLHDL 3 10/19/2018  On Lipitor. - last eye exam was in 10/2018: + Mild NPDR. [facility].  -+ Numbness and tingling in his feet.  This has improved on alpha-lipoic acid and B complex.  He also has a history of GERD, HTN, OSA.  TSH levels were normal: Lab Results  Component Value Date   TSH 2.54 10/19/2018  04/11/2018: TSH 3.02  Vitamin D levels were normal: Lab Results  Component Value Date   VD25OH 68.22 10/19/2018   VD25OH 83.45 10/03/2017   VD25OH 41.95 06/06/2015   VD25OH 35.03 07/17/2014   VD25OH 44 03/01/2013  04/11/2018: vitamin D 46.7.   Previously on ergocalciferol.  He has adhesive capsulitis >> improving.  He is getting occasional steroid injections but we discussed to reduce these as much as possible.  ROS: Constitutional: no weight gain/no weight loss, +  fatigue, no subjective hyperthermia, no subjective hypothermia Eyes: no blurry vision, no xerophthalmia ENT: no sore throat, no nodules palpated in neck, no dysphagia, no odynophagia, no hoarseness Cardiovascular: no CP/no SOB/no palpitations/no leg swelling Respiratory: no cough/no SOB/no wheezing Gastrointestinal: no N/no V/no D/no C/no acid reflux Musculoskeletal: no muscle aches/+ joint aches Skin: no rashes, no hair loss Neurological: no tremors/+ numbness/+ tingling/no dizziness  I reviewed pt's medications, allergies, PMH, social hx, family hx, and changes were documented in the history of present illness. Otherwise, unchanged from my initial visit note.  Past Medical History:  Diagnosis Date  . Diabetes mellitus   . GERD (gastroesophageal reflux disease)   . Heart murmur   . Hyperlipidemia   . Hypertension   . OSA (obstructive sleep apnea)   . Pneumonia    Past Surgical History:  Procedure Laterality Date  . CERVICAL DISC ARTHROPLASTY N/A 03/16/2017   Procedure: CERVICAL FIVE- CERVICAL SIX [place] ARTHROPLASTY;  Surgeon: Jovita Gamma, MD;  Location: [place];  Service: Neurosurgery;  Laterality: N/A;  CERVICAL 5- CERVICAL 6 DISC ARTHROPLASTY  . COLONOSCOPY    . TONSILLECTOMY     Social History   Socioeconomic History  . Marital status: Married    Spouse name: Not on file  . Number of children: 3  . Years of education: Not on file  . Highest education level: Not on file  Occupational History  . Occupation: RADIATION SAFETY OFFICER    Employer: [employer]  Social Needs  . Financial resource strain: Not on file  . Food insecurity    Worry: Not on file    Inability: Not on file  . Transportation needs    Medical: Not on file    Non-medical: Not on file  Tobacco Use  . Smoking status: Never Smoker  . Smokeless tobacco: Never Used  Substance and Sexual Activity  . Alcohol use: No    Alcohol/week: 0.0 standard drinks  . Drug use: No  . Sexual activity: Yes     Partners: Female  Lifestyle  . Physical activity    Days per week: Not on file    Minutes per session: Not on file  . Stress: Not on file  Relationships  . Social Herbalist on phone: Not on file    Gets together: Not on file    Attends religious service: Not on file    Active member of club or organization: Not on file    Attends meetings of clubs or organizations: Not on file    Relationship status: Not on file  . Intimate partner violence  Fear of current or ex partner: Not on file    Emotionally abused: Not on file    Physically abused: Not on file    Forced sexual activity: Not on file  Other Topics Concern  . Not on file  Social History Narrative   Regular exercise: runs 3 miles 3x week, takes one day off per week to rest   Caffeine use: stopped all diet soda on 04/23/15 per advice from Sports MD   Current Outpatient Medications on File Prior to Visit  Medication Sig Dispense Refill  . ADDERALL XR 20 MG 24 hr capsule TAKE 1 CAPSULE BY MOUTH EVERY MORNING 30 capsule 0  . alfuzosin (UROXATRAL) 10 MG 24 hr tablet Take 1 tablet (10 mg total) by mouth daily with breakfast. 90 tablet 1  . Alpha-Lipoic Acid 600 MG CAPS Take 1 capsule by mouth daily.     . Ascorbic Acid (VITAMIN C) 1000 MG tablet Take 1,000 mg by mouth daily.    Marland Kitchen aspirin EC 81 MG tablet Take 81 mg by mouth daily.    Marland Kitchen atorvastatin (LIPITOR) 80 MG tablet Take 80 mg by mouth daily.    Marland Kitchen b complex vitamins tablet Take 1 tablet by mouth daily.    Marland Kitchen BAYER MICROLET LANCETS lancets USE TO TEST BLOOD SUGAR 6 TIMES A DAY 300 each 11  . Beclomethasone Dipropionate (QNASL) 80 MCG/ACT AERS Place 2 puffs into the nose daily as needed (allergies). 10.6 g 5  . Biotin 5000 MCG CAPS Take by mouth.    . Calcium-Magnesium-Vitamin D (CALCIUM 500 PO) Take 1 tablet by mouth daily.     . Continuous Blood Gluc Sensor (FREESTYLE LIBRE 14 DAY SENSOR) MISC APPLY AS DIRECTED EVERY 14 (FOURTEEN) DAYS. CHANGE EVERY 2 WEEKS 2 each  11  . glucagon (GLUCAGON EMERGENCY) 1 MG injection Inject 1 mg into the muscle once as needed. 2 each prn  . glucose blood (CONTOUR NEXT TEST) test strip USE AS INSTRUCTED TO CHECK SUGAR 6 TIMES DAILY 400 each 5  . losartan (COZAAR) 50 MG tablet TAKE 1 TABLET BY MOUTH ONCE DAILY (Patient taking differently: TAKE 50 mg BY MOUTH ONCE DAILY) 90 tablet 1  . Magnesium Oxide 400 MG CAPS Take 1 capsule (400 mg total) by mouth daily. 90 capsule 1  . metFORMIN (GLUCOPHAGE) 500 MG tablet TAKE 2 TABLETS BY MOUTH DAILY WITH SUPPER 180 tablet 1  . Misc Natural Products (TURMERIC CURCUMIN) CAPS Take 1 capsule by mouth daily.     . NON FORMULARY CPAP    . pantoprazole (PROTONIX) 40 MG tablet TAKE 1 TABLET BY MOUTH EVERY DAILY 90 tablet 1  . SILVADENE 1 % cream APPLY 1 APPLICATION TOPICALLY DAILY. 50 g 0  . traZODone (DESYREL) 50 MG tablet TAKE 1/2 - 1 TABLET (25-50 MG TOTAL) BY MOUTH AT BEDTIME AS NEEDED FOR SLEEP. 30 tablet 3  . Vitamin D, Ergocalciferol, (DRISDOL) 1.25 MG (50000 UT) CAPS capsule TAKE 1 CAPSULE BY MOUTH EVERY 7 DAYS. 12 capsule 0   No current facility-administered medications on file prior to visit.    Allergies  Allergen Reactions  . Lisinopril Swelling and Other (See Comments)    Extreme facial swelling causing hospitalization  . Shellfish Allergy Anaphylaxis  . Zetia [Ezetimibe] Other (See Comments)    ABDOMINAL CRAMPING  . Penicillins Other (See Comments)    UNSPECIFIED REACTION > Childhood allergy    . Cymbalta [Duloxetine Hcl] Other (See Comments)    Severe feet cramps   Family History  Problem Relation Age of Onset  . Colon cancer Maternal Grandfather   . Colon cancer Paternal Grandfather   . Heart disease Father        Valve replacement; pacemaker  . Alcohol abuse Neg Hx   . Asthma Neg Hx   . Diabetes Neg Hx   . Hyperlipidemia Neg Hx   . Hypertension Neg Hx   . Kidney disease Neg Hx   . Stroke Neg Hx     PE: Ht 5' 10"  (1.778 m)   BMI 34.29 kg/m  Body mass  index is 34.29 kg/m.  Wt Readings from Last 3 Encounters:  03/21/19 239 lb (108.4 kg)  12/07/18 236 lb (107 kg)  10/18/18 238 lb (108 kg)   Constitutional: overweight, in NAD Eyes: PERRLA, EOMI, no exophthalmos ENT: moist mucous membranes, no thyromegaly, no cervical lymphadenopathy Cardiovascular: RRR, No MRG Respiratory: CTA B Gastrointestinal: abdomen soft, NT, ND, BS+ Musculoskeletal: no deformities, strength intact in all 4 Skin: moist, warm, no rashes Neurological: no tremor with outstretched hands, DTR normal in all 4  ASSESSMENT: 1. DM1, insulin-dependent, uncontrolled, without complications - DR - PN  -Insulin pump -Was on a Dexcom CGM and previously on Enlite CGM, which found less accurate >> retried this Summer 2017 >> still poor accuracy.  Currently on freestyle libre CGM.  - Barriers to good control:  Very busy schedule >> less time to check sugars and eat but But he does a great job with bolusing during the day >> still has a lot of large boluses during the day, sometimes without documented carbs or sugars  Lack of sleep >> usually sleeps max 5h a night (!) and tries to catch up in the weekend  Reaches home late at night  - sometimes at 1 am >> eats >65% of the daily calories then  2. HL  PLAN:  1. Patient with longstanding, uncontrolled, type 1 diabetes, with high insulin resistance, with slightly better control at last visit after adding a GLP-1 receptor agonist.  However, he contacted me in 02/2019 and we had to stop Trulicity then due to abdominal pain and nausea. -At last visit, sugars were better but they continue to be high at night.  They were not significantly increased after meals, in the presence of Trulicity.  He is usually eating late and sugars stay high through the night.  At that time, we increased his basal rates from 4 AM to 8 AM.  Otherwise, we did not change his regimen.  I refilled his strips and also sent a prescription for inhaled glucagon to  his pharmacy.  We also discussed about his steroid injections.  At that time he was able to void on for several months but we discussed that if he had another one, he needed to strengthen his insulin to carb ratio to 1:2 - since last visit he was able to obtain a Dexcom G6 CGM (prev. Libre CGM). He likes it. -At this visit, sugars appear improved especially in the early morning, but he tells me that he feels that his sugars increase even without eating in the morning, around 8 AM.  We reviewed together the Dexcom CGM downloads (will be scanned) and he does have an increase in blood sugars in the morning, almost daily.  However, he remains high throughout the day with the exception of a period of time before dinner when his sugars are lower, but still above goal.  He appears to be getting a lot more insulin from boluses compared  to basal rate so we discussed about increasing the basal rates to bring all of his blood sugars closer to goal.  However, between 4 AM and 8 AM, I would like to continue the current basal rate which is the highest of his day.  We increased this at last visit. -We also reviewed together the proportion of time when his sugars are at target and at this visit, they are within target only 57.6% of the time and we discussed that ideally this is higher than 70%. -As of now, it appears that his insulin to carb ratios are adequate so I did not advise him to change this. -We will also continue the Metformin at 1000 milligrams twice a day as I think that this is decreasing his insulin resistance. -Unfortunately, he could not tolerate Trulicity due to nausea and also abdominal pain but we discussed that this is not a class effect and at next visit, we may try Ozempic.  He is open to this possibility. - I advised him to:  Patient Instructions  Please increase: - Basal rates: 12 AM: 2.8 >> 3.2 4 AM: 3.1  8 AM: 2.85 >> 3.0 12 PM: 2.70  >> 3.0 4 PM: 2.95 >> 3.3 9:00 PM: 2.85 >> 3.2 10:30 PM:  2.80 >> 3.2 - bolus: - Insulin to carb ratio:  12 AM: 2 11 AM: 2 5 PM: 2 - Insulin sensitivity factor:  13 - target: 100-120 - Active insulin time: 3 hours  Start bolusing 15 minutes before each meal.  Please come back for a follow-up appointment in 3-4 months.  - we checked his HbA1c: 7.4% (a little better) - advised to check sugars at different times of the day - 4x a day, rotating check times - advised for yearly eye exams >> he is UTD - return to clinic in 3-4 months  2. HL -Reviewed latest lipid panel from 10/2018: LDL at goal, HDL slightly low: Lab Results  Component Value Date   CHOL 99 10/19/2018   HDL 34.70 (L) 10/19/2018   LDLCALC 41 10/19/2018   LDLDIRECT 87.0 12/02/2016   TRIG 118.0 10/19/2018   CHOLHDL 3 10/19/2018  -Continues the statin without side effects   - time spent with the patient: 40 min, of which >50% was spent in reviewing his pump and CGM downloads, discussing his hypo- and hyper-glycemic episodes, reviewing previous labs and pump settings and developing a plan to avoid hypo- and hyper-glycemia.   Philemon Kingdom, MD PhD [facility]

## 2019-04-06 NOTE — Addendum Note (Signed)
Addended by: Cardell Peach I on: 04/06/2019 04:35 PM   Modules accepted: Orders

## 2019-04-06 NOTE — Patient Instructions (Addendum)
Please increase: - Basal rates: 12 AM: 2.8 >> 3.2 4 AM: 3.1  8 AM: 2.85 >> 3.0 12 PM: 2.70  >> 3.0 4 PM: 2.95 >> 3.3 9:00 PM: 2.85 >> 3.2 10:30 PM: 2.80 >> 3.2 - bolus: - Insulin to carb ratio:  12 AM: 2 11 AM: 2 5 PM: 2 - Insulin sensitivity factor:  13 - target: 100-120 - Active insulin time: 3 hours  Start bolusing 15 minutes before each meal.  Please come back for a follow-up appointment in 3-4 months.

## 2019-04-11 ENCOUNTER — Other Ambulatory Visit: Payer: Self-pay | Admitting: Family Medicine

## 2019-04-11 MED FILL — VIT D2 1.25 MG (50,000 UNIT: 1.25 MG | 84 days supply | Qty: 12 | Fill #0

## 2019-04-24 MED FILL — traZODone HCL 50 MG TABS: 50 | 30 days supply | Qty: 30 | Fill #2

## 2019-04-30 ENCOUNTER — Other Ambulatory Visit: Payer: Self-pay | Admitting: Internal Medicine

## 2019-04-30 ENCOUNTER — Encounter: Payer: Self-pay | Admitting: *Deleted

## 2019-04-30 ENCOUNTER — Encounter: Payer: Self-pay | Admitting: Family Medicine

## 2019-04-30 DIAGNOSIS — E139 Other specified diabetes mellitus without complications: Secondary | ICD-10-CM

## 2019-04-30 MED FILL — LOSARTAN POTASSIUM 50 MG TA: 50 | 90 days supply | Qty: 90 | Fill #0

## 2019-05-02 ENCOUNTER — Encounter: Payer: Self-pay | Admitting: Family Medicine

## 2019-05-02 ENCOUNTER — Ambulatory Visit (INDEPENDENT_AMBULATORY_CARE_PROVIDER_SITE_OTHER): Payer: 59 | Admitting: Family Medicine

## 2019-05-02 DIAGNOSIS — S76312D Strain of muscle, fascia and tendon of the posterior muscle group at thigh level, left thigh, subsequent encounter: Secondary | ICD-10-CM | POA: Diagnosis not present

## 2019-05-02 NOTE — Progress Notes (Signed)
Virtual Visit via Video Note  I connected with Zachary Graham on 05/02/19 at  8:15 AM EST by a video enabled telemedicine application and verified that I am speaking with the correct person using two identifiers.  Location: Patient: In office setting in his own office alone Provider: In medical office setting   I discussed the limitations of evaluation and management by telemedicine and the availability of in person appointments. The patient expressed understanding and agreed to proceed.  History of Present Illness: The 58 year old male with hamstring injury doing well. Discussed HEP, doing the compression. No pain regular. Is working out regular.     Observations/Objective: A&Ox3 no pain. Visual platform failed during exam.    Assessment and Plan: hamstring injury doing well. Compression with working out another 6 weeks.  RTC in 6 weeks    Follow Up Instructions: 6 weeks if not perfect     I discussed the assessment and treatment plan with the patient. The patient was provided an opportunity to ask questions and all were answered. The patient agreed with the plan and demonstrated an understanding of the instructions.   The patient was advised to call back or seek an in-person evaluation if the symptoms worsen or if the condition fails to improve as anticipated.  I provided 12 minutes of -face-to-face time during this encounter.   [name], DO

## 2019-05-08 ENCOUNTER — Ambulatory Visit (INDEPENDENT_AMBULATORY_CARE_PROVIDER_SITE_OTHER): Payer: 59 | Admitting: Internal Medicine

## 2019-05-08 ENCOUNTER — Encounter: Payer: Self-pay | Admitting: Internal Medicine

## 2019-05-08 ENCOUNTER — Other Ambulatory Visit: Payer: Self-pay

## 2019-05-08 VITALS — BP 122/74 | HR 68 | Temp 98.1°F | Ht 70.0 in | Wt 246.0 lb

## 2019-05-08 DIAGNOSIS — G8929 Other chronic pain: Secondary | ICD-10-CM

## 2019-05-08 DIAGNOSIS — R102 Pelvic and perineal pain: Secondary | ICD-10-CM | POA: Diagnosis not present

## 2019-05-08 DIAGNOSIS — H9193 Unspecified hearing loss, bilateral: Secondary | ICD-10-CM

## 2019-05-08 DIAGNOSIS — E1065 Type 1 diabetes mellitus with hyperglycemia: Secondary | ICD-10-CM | POA: Diagnosis not present

## 2019-05-08 DIAGNOSIS — N411 Chronic prostatitis: Secondary | ICD-10-CM | POA: Diagnosis not present

## 2019-05-08 DIAGNOSIS — J301 Allergic rhinitis due to pollen: Secondary | ICD-10-CM | POA: Diagnosis not present

## 2019-05-08 LAB — BASIC METABOLIC PANEL
BUN: 11 mg/dL (ref 6–23)
CO2: 26 mEq/L (ref 19–32)
Calcium: 9 mg/dL (ref 8.4–10.5)
Chloride: 101 mEq/L (ref 96–112)
Creatinine, Ser: 0.68 mg/dL (ref 0.40–1.50)
GFR: 120.59 mL/min (ref >60.00)
Glucose, Bld: 145 mg/dL — ABNORMAL HIGH (ref 70–99)
Potassium: 3.8 mEq/L (ref 3.5–5.1)
Sodium: 138 mEq/L (ref 135–145)

## 2019-05-08 LAB — URINALYSIS, ROUTINE W REFLEX MICROSCOPIC
Bilirubin Urine: NEGATIVE
Hgb urine dipstick: NEGATIVE
Ketones, ur: NEGATIVE
Leukocytes,Ua: NEGATIVE
Nitrite: NEGATIVE
RBC / HPF: NONE SEEN (ref 0–?)
Specific Gravity, Urine: 1.025 (ref 1.000–1.030)
Total Protein, Urine: NEGATIVE
Urine Glucose: 100 — AB
Urobilinogen, UA: 0.2 (ref 0.0–1.0)
pH: 6 (ref 5.0–8.0)

## 2019-05-08 LAB — SEDIMENTATION RATE: Sed Rate: 14 mm/hr (ref 0–20)

## 2019-05-08 MED ORDER — LEVOCETIRIZINE DIHYDROCHLORIDE 5 MG PO TABS: 5.0000 mg | ORAL_TABLET | Freq: Every evening | ORAL | 1 refills | Status: DC

## 2019-05-08 MED ORDER — MELOXICAM 7.5 MG PO TABS: 7.5000 mg | ORAL_TABLET | Freq: Every day | ORAL | 0 refills | Status: DC

## 2019-05-08 MED ORDER — AZELASTINE HCL 0.1 % NA SOLN: 2.0000 | Freq: Two times a day (BID) | NASAL | 1 refills | Status: AC

## 2019-05-08 MED FILL — MELOXICAM 7.5 MG TABLET: 7.5 | 90 days supply | Qty: 90 | Fill #0

## 2019-05-08 MED FILL — AZELASTINE HCL 137 MCG SPRY: 0.1 | 50 days supply | Qty: 60 | Fill #0

## 2019-05-08 MED FILL — LEVOCETIRIZINE 5 MG TABLET: 5 | 90 days supply | Qty: 90 | Fill #0

## 2019-05-08 NOTE — Progress Notes (Signed)
Subjective:  Patient ID: Zachary Graham, male    DOB: [birth year]  Age: 58 y.o. MRN: [id number]  CC: Allergic Rhinitis   This visit occurred during the SARS-CoV-2 public health emergency.  Safety protocols were in place, including screening questions prior to the visit, additional usage of staff PPE, and extensive cleaning of exam room while observing appropriate contact time as indicated for disinfecting solutions.   HPI Zachary Graham presents for f/up - He complains of chronic, intermittent throat clearing that disturbs the people around him.  He also complains of sneezing, runny nose, postnasal drip, nasal congestion.  He has gotten some symptom relief with the steroid nasal spray.  He also complains of chronic pelvic pain.  He describes a vague discomfort in his scrotum and left groin.  He has a history of prostatitis but denies any recent episodes of dysuria, urgency, hesitancy, or nocturia.  He is not taking anything for the discomfort.  Outpatient Medications Prior to Visit  Medication Sig Dispense Refill  . ADDERALL XR 20 MG 24 hr capsule TAKE 1 CAPSULE BY MOUTH EVERY MORNING 30 capsule 0  . Alpha-Lipoic Acid 600 MG CAPS Take 1 capsule by mouth daily.     . Ascorbic Acid (VITAMIN C) 1000 MG tablet Take 1,000 mg by mouth daily.    Marland Kitchen aspirin EC 81 MG tablet Take 81 mg by mouth daily.    Marland Kitchen atorvastatin (LIPITOR) 80 MG tablet Take 80 mg by mouth daily.    Marland Kitchen b complex vitamins tablet Take 1 tablet by mouth daily.    Marland Kitchen BAYER MICROLET LANCETS lancets USE TO TEST BLOOD SUGAR 6 TIMES A DAY 300 each 11  . Beclomethasone Dipropionate (QNASL) 80 MCG/ACT AERS Place 2 puffs into the nose daily as needed (allergies). 10.6 g 5  . Biotin 5000 MCG CAPS Take by mouth.    . Calcium-Magnesium-Vitamin D (CALCIUM 500 PO) Take 1 tablet by mouth daily.     Marland Kitchen glucagon (GLUCAGON EMERGENCY) 1 MG injection Inject 1 mg into the muscle once as needed. 2 each prn  . glucose blood (CONTOUR NEXT TEST) test strip USE  AS INSTRUCTED TO CHECK SUGAR 6 TIMES DAILY 400 each 5  . losartan (COZAAR) 50 MG tablet TAKE 1 TABLET BY MOUTH ONCE DAILY 90 tablet 1  . Magnesium Oxide 400 MG CAPS Take 1 capsule (400 mg total) by mouth daily. 90 capsule 1  . metFORMIN (GLUCOPHAGE) 500 MG tablet TAKE 2 TABLETS BY MOUTH DAILY WITH SUPPER 180 tablet 1  . Misc Natural Products (TART CHERRY ADVANCED) CAPS     . Misc Natural Products (TURMERIC CURCUMIN) CAPS Take 1 capsule by mouth daily.     . NON FORMULARY CPAP    . pantoprazole (PROTONIX) 40 MG tablet TAKE 1 TABLET BY MOUTH EVERY DAILY 90 tablet 1  . SILVADENE 1 % cream APPLY 1 APPLICATION TOPICALLY DAILY. 50 g 0  . traZODone (DESYREL) 50 MG tablet TAKE 1/2 - 1 TABLET (25-50 MG TOTAL) BY MOUTH AT BEDTIME AS NEEDED FOR SLEEP. 30 tablet 3  . Vitamin D, Ergocalciferol, (DRISDOL) 1.25 MG (50000 UT) CAPS capsule TAKE 1 CAPSULE BY MOUTH EVERY 7 DAYS. 12 capsule 0  . alfuzosin (UROXATRAL) 10 MG 24 hr tablet Take 1 tablet (10 mg total) by mouth daily with breakfast. (Patient not taking: Reported on 05/08/2019) 90 tablet 1   No facility-administered medications prior to visit.    ROS Review of Systems  Constitutional: Negative for chills, fatigue, fever and  unexpected weight change.  HENT: Positive for congestion, hearing loss, postnasal drip, rhinorrhea and sneezing. Negative for facial swelling, nosebleeds, sinus pressure, sinus pain and sore throat.   Eyes: Negative for visual disturbance.  Respiratory: Negative for cough, chest tightness, shortness of breath and wheezing.   Cardiovascular: Negative for chest pain, palpitations and leg swelling.  Gastrointestinal: Negative for abdominal pain, constipation, diarrhea, nausea and vomiting.  Endocrine: Negative.   Genitourinary: Positive for testicular pain. Negative for decreased urine volume, difficulty urinating, discharge, dysuria, flank pain, frequency, genital sores, penile pain, penile swelling, scrotal swelling and urgency.    Musculoskeletal: Negative.  Negative for arthralgias and myalgias.  Skin: Negative.  Negative for color change, pallor and rash.  Neurological: Negative.  Negative for dizziness, weakness and light-headedness.  Hematological: Negative for adenopathy. Does not bruise/bleed easily.  Psychiatric/Behavioral: Negative.     Objective:  BP 122/74 (BP Location: Left Arm, Patient Position: Sitting, Cuff Size: Large)   Pulse 68   Temp 98.1 F (36.7 C) (Oral)   Ht 5\' 10"  (1.778 m)   Wt 246 lb (111.6 kg)   SpO2 98%   BMI 35.30 kg/m   BP Readings from Last 3 Encounters:  05/08/19 122/74  04/06/19 138/70  03/21/19 130/70    Wt Readings from Last 3 Encounters:  05/08/19 246 lb (111.6 kg)  04/06/19 243 lb (110.2 kg)  03/21/19 239 lb (108.4 kg)    Physical Exam Vitals reviewed.  Constitutional:      Appearance: Normal appearance.  HENT:     Right Ear: Tympanic membrane, ear canal and external ear normal. There is no impacted cerumen.     Left Ear: Tympanic membrane, ear canal and external ear normal. There is no impacted cerumen.     Nose: Congestion and rhinorrhea present.     Mouth/Throat:     Mouth: Mucous membranes are moist.  Eyes:     General: No scleral icterus.    Conjunctiva/sclera: Conjunctivae normal.  Cardiovascular:     Rate and Rhythm: Normal rate and regular rhythm.     Pulses: Normal pulses.     Heart sounds: No murmur.  Pulmonary:     Effort: Pulmonary effort is normal.     Breath sounds: No stridor. No wheezing, rhonchi or rales.  Abdominal:     General: Abdomen is flat. Bowel sounds are normal. There is no distension.     Palpations: Abdomen is soft. There is no hepatomegaly or splenomegaly.     Tenderness: There is no abdominal tenderness.     Hernia: No hernia is present.  Genitourinary:    Pubic Area: No rash.      Penis: Normal and circumcised. No discharge, swelling or lesions.      Testes: Normal.        Right: Mass, tenderness, swelling,  testicular hydrocele or varicocele not present.        Left: Mass, tenderness, swelling, testicular hydrocele or varicocele not present.     Epididymis:     Right: Normal. Not inflamed or enlarged.     Left: Normal. Not inflamed or enlarged.     Prostate: Enlarged (1+ smooth symm BPH). Not tender and no nodules present.     Rectum: Normal. Guaiac result negative. No mass, tenderness, anal fissure, external hemorrhoid or internal hemorrhoid. Normal anal tone.  Musculoskeletal:        General: Normal range of motion.     Cervical back: Neck supple.     Right lower leg: No edema.  Left lower leg: No edema.  Lymphadenopathy:     Cervical: No cervical adenopathy.     Lower Body: No right inguinal adenopathy. No left inguinal adenopathy.  Skin:    General: Skin is warm.  Neurological:     General: No focal deficit present.     Mental Status: He is alert.  Psychiatric:        Mood and Affect: Mood normal.        Behavior: Behavior normal.     Lab Results  Component Value Date   WBC 8.0 06/08/2018   HGB 13.8 06/08/2018   HCT 40.4 06/08/2018   PLT 224.0 06/08/2018   GLUCOSE 145 (H) 05/08/2019   CHOL 99 10/19/2018   TRIG 118.0 10/19/2018   HDL 34.70 (L) 10/19/2018   LDLDIRECT 87.0 12/02/2016   LDLCALC 41 10/19/2018   ALT 23 10/19/2018   AST 16 10/19/2018   NA 138 05/08/2019   K 3.8 05/08/2019   CL 101 05/08/2019   CREATININE 0.68 05/08/2019   BUN 11 05/08/2019   CO2 26 05/08/2019   TSH 2.54 10/19/2018   PSA 0.31 10/19/2018   HGBA1C 7.4 (A) 04/06/2019   MICROALBUR 1.3 10/19/2018    MR Cervical Spine Wo Contrast  Result Date: 09/08/2018 CLINICAL DATA:  Bilateral hand numbness and grip weakness for the past 3 weeks. Prior surgery in October 2018. EXAM: MRI CERVICAL SPINE WITHOUT CONTRAST TECHNIQUE: Multiplanar, multisequence MR imaging of the cervical spine was performed. No intravenous contrast was administered. COMPARISON:  Cervical spine x-rays dated April 11, 2017.  MRI cervical spine dated January 11, 2017. FINDINGS: Alignment: Physiologic. Vertebrae: No fracture, evidence of discitis, or bone lesion. Cord: Normal signal and morphology. Posterior Fossa, vertebral arteries, paraspinal tissues: Negative. Disc levels: C2-C3:  Negative. C3-C4: Unchanged shallow disc bulge and mild bilateral uncovertebral hypertrophy. No stenosis. C4-C5: Unchanged shallow disc bulge with superimposed small central disc protrusion. Unchanged minimal bilateral uncovertebral hypertrophy. No stenosis. C5-C6:  Interval disc replacement.  No residual stenosis. C6-C7:  Minimal disc bulging.  No stenosis. C7-T1:  Tiny central disc protrusion.  No stenosis. IMPRESSION: 1. Very mild cervical spondylosis as described above, similar to prior study. No stenosis or impingement. 2. Interval C5-C6 disc replacement. Electronically Signed   By: Titus Dubin M.D.   On: 09/08/2018 08:57    Assessment & Plan:   Joban was seen today for allergic rhinitis .  Diagnoses and all orders for this visit:  Seasonal allergic rhinitis due to pollen- I think this causes his throat clearing.  I have asked him to use an antihistamine nasal spray as well as an oral antihistamine. -     levocetirizine (XYZAL) 5 MG tablet; Take 1 tablet (5 mg total) by mouth every evening. -     azelastine (ASTELIN) 0.1 % nasal spray; Place 2 sprays into both nostrils 2 (two) times daily. 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  I recommended that he try an anti-inflammatory to treat the discomfort. -     meloxicam (MOBIC) 7.5 MG tablet; Take 1 tablet (7.5 mg total) by mouth daily. -     MR Pelvis W Wo Contrast; Future -     Urinalysis, Routine w reflex microscopic  Chronic male pelvic pain- I have asked him to undergo an MRI of the pelvis to see if there is an occult infection, malignancy, hernia, abnormal ligament alignment, or boney lesion. -     meloxicam  (  MOBIC) 7.5 MG tablet; Take 1 tablet (7.5 mg total) by mouth daily. -     MR Pelvis W Wo Contrast; Future -     Urinalysis, Routine w reflex microscopic -     Sedimentation rate; Future -     Sedimentation rate  Bilateral hearing loss, unspecified hearing loss type -     Ambulatory referral to Audiology  Type 1 diabetes mellitus with hyperglycemia, with long-term current use of insulin ([place]) -     Basic metabolic panel   I have discontinued Elta Guadeloupe D. Nelson's alfuzosin. I am also having him start on levocetirizine, azelastine, and meloxicam. Additionally, I am having him maintain his Turmeric Curcumin, Calcium-Magnesium-Vitamin D (CALCIUM 500 PO), vitamin C, Alpha-Lipoic Acid, b complex vitamins, aspirin EC, NON FORMULARY, Bayer Microlet Lancets, glucagon, Silvadene, Magnesium Oxide, Biotin, atorvastatin, Beclomethasone Dipropionate, pantoprazole, metFORMIN, traZODone, Contour Next Test, Adderall XR, Vitamin D (Ergocalciferol), losartan, and Tart Cherry Advanced.  Meds ordered this encounter  Medications  . levocetirizine (XYZAL) 5 MG tablet    Sig: Take 1 tablet (5 mg total) by mouth every evening.    Dispense:  90 tablet    Refill:  1  . azelastine (ASTELIN) 0.1 % nasal spray    Sig: Place 2 sprays into both nostrils 2 (two) times daily. Use in each nostril as directed    Dispense:  90 mL    Refill:  1  . meloxicam (MOBIC) 7.5 MG tablet    Sig: Take 1 tablet (7.5 mg total) by mouth daily.    Dispense:  90 tablet    Refill:  0     Follow-up: Return in about 3 months (around 08/06/2019).  Scarlette Calico, MD

## 2019-05-08 NOTE — Patient Instructions (Signed)

## 2019-05-12 ENCOUNTER — Ambulatory Visit
Admission: RE | Admit: 2019-05-12 | Discharge: 2019-05-12 | Disposition: A | Discharge status: Home / Self Care | Payer: 59 | Source: Ambulatory Visit | Attending: Internal Medicine | Admitting: Internal Medicine

## 2019-05-12 ENCOUNTER — Other Ambulatory Visit: Payer: Self-pay

## 2019-05-12 DIAGNOSIS — K5730 Diverticulosis of large intestine without perforation or abscess without bleeding: Secondary | ICD-10-CM | POA: Diagnosis not present

## 2019-05-12 DIAGNOSIS — R102 Pelvic and perineal pain: Secondary | ICD-10-CM

## 2019-05-12 DIAGNOSIS — G8929 Other chronic pain: Secondary | ICD-10-CM

## 2019-05-12 DIAGNOSIS — N411 Chronic prostatitis: Secondary | ICD-10-CM

## 2019-05-12 MED ORDER — GADOBENATE DIMEGLUMINE 529 MG/ML IV SOLN
20.0000 mL | Freq: Once | INTRAVENOUS | Status: AC | PRN
  Administered 2019-05-12: 20 mL via INTRAVENOUS

## 2019-05-14 ENCOUNTER — Encounter: Payer: Self-pay | Admitting: Internal Medicine

## 2019-05-14 DIAGNOSIS — K5790 Diverticulosis of intestine, part unspecified, without perforation or abscess without bleeding: Secondary | ICD-10-CM | POA: Diagnosis not present

## 2019-05-14 DIAGNOSIS — Z01818 Encounter for other preprocedural examination: Secondary | ICD-10-CM | POA: Diagnosis not present

## 2019-05-15 ENCOUNTER — Other Ambulatory Visit: Payer: Self-pay | Admitting: Internal Medicine

## 2019-05-15 DIAGNOSIS — F900 Attention-deficit hyperactivity disorder, predominantly inattentive type: Secondary | ICD-10-CM

## 2019-05-15 MED FILL — HumaLOG 100 UNIT/ML SOLN: 100 | 84 days supply | Qty: 120 | Fill #1

## 2019-05-15 MED FILL — FREESTYLE LIBRE 14 DAY SENS: 28 days supply | Qty: 2 | Fill #2

## 2019-05-16 MED FILL — ADDERALL XR 20 MG CAP SA: 20 | 30 days supply | Qty: 30 | Fill #0

## 2019-05-23 ENCOUNTER — Other Ambulatory Visit: Payer: Self-pay | Admitting: Internal Medicine

## 2019-05-23 MED FILL — traZODone HCL 50 MG TABS: 50 | 30 days supply | Qty: 30 | Fill #3

## 2019-05-23 MED FILL — PANTOPRAZOLE SOD DR 40 MG T: 40 | 90 days supply | Qty: 90 | Fill #0

## 2019-05-25 ENCOUNTER — Encounter: Payer: Self-pay | Admitting: Internal Medicine

## 2019-05-28 DIAGNOSIS — K5730 Diverticulosis of large intestine without perforation or abscess without bleeding: Secondary | ICD-10-CM | POA: Diagnosis not present

## 2019-05-28 DIAGNOSIS — Z800 Family history of malignant neoplasm of digestive organs: Secondary | ICD-10-CM | POA: Diagnosis not present

## 2019-05-28 DIAGNOSIS — Z1211 Encounter for screening for malignant neoplasm of colon: Secondary | ICD-10-CM | POA: Diagnosis not present

## 2019-05-28 DIAGNOSIS — Z8371 Family history of colonic polyps: Secondary | ICD-10-CM | POA: Diagnosis not present

## 2019-05-28 LAB — HM COLONOSCOPY

## 2019-05-29 ENCOUNTER — Encounter: Payer: Self-pay | Admitting: Internal Medicine

## 2019-05-30 ENCOUNTER — Encounter: Payer: Self-pay | Admitting: Internal Medicine

## 2019-05-31 MED FILL — CONTOUR NEXT STRIPS: 66 days supply | Qty: 400 | Fill #0

## 2019-06-05 ENCOUNTER — Encounter: Payer: Self-pay | Admitting: Family Medicine

## 2019-06-19 ENCOUNTER — Other Ambulatory Visit: Payer: Self-pay | Admitting: Family Medicine

## 2019-06-19 ENCOUNTER — Other Ambulatory Visit: Payer: Self-pay | Admitting: Internal Medicine

## 2019-06-19 DIAGNOSIS — F900 Attention-deficit hyperactivity disorder, predominantly inattentive type: Secondary | ICD-10-CM

## 2019-06-19 MED FILL — ADDERALL XR 20 MG CAP SA: 20 | 30 days supply | Qty: 30 | Fill #0

## 2019-06-19 MED FILL — AZELASTINE HCL 137 MCG SPRY: 0.1 | 50 days supply | Qty: 60 | Fill #1

## 2019-06-19 MED FILL — traZODone HCL 50 MG TABS: 50 | 30 days supply | Qty: 30 | Fill #0

## 2019-06-19 MED FILL — VIT D2 1.25 MG (50,000 UNIT: 1.25 MG | 84 days supply | Qty: 12 | Fill #0

## 2019-06-19 MED FILL — QNASL 80 MCG/ACT AERS: 80 | 30 days supply | Qty: 11 | Fill #2

## 2019-06-25 ENCOUNTER — Encounter: Payer: Self-pay | Admitting: Internal Medicine

## 2019-07-23 MED FILL — HumaLOG 100 UNIT/ML SOLN: 100 | 84 days supply | Qty: 120 | Fill #2

## 2019-07-23 MED FILL — traZODone HCL 50 MG TABS: 50 | 30 days supply | Qty: 30 | Fill #1

## 2019-07-24 ENCOUNTER — Other Ambulatory Visit: Payer: Self-pay | Admitting: Internal Medicine

## 2019-07-24 DIAGNOSIS — F900 Attention-deficit hyperactivity disorder, predominantly inattentive type: Secondary | ICD-10-CM

## 2019-07-24 MED FILL — ADDERALL XR 20 MG CAP SA: 20 | 30 days supply | Qty: 30 | Fill #0

## 2019-07-31 ENCOUNTER — Telehealth: Payer: Self-pay | Admitting: Nutrition

## 2019-07-31 NOTE — Telephone Encounter (Signed)
Patient's dexcom is linked to ours.  His download was printed and put on Dr. Arman Filter desk

## 2019-07-31 NOTE — Telephone Encounter (Signed)
Patient does not remember his user name and password to his [employer] account, and does not use his old meter for download.  He wants to come in tomorrow to download his pump.

## 2019-08-02 ENCOUNTER — Encounter: Payer: Self-pay | Admitting: Internal Medicine

## 2019-08-02 ENCOUNTER — Ambulatory Visit (INDEPENDENT_AMBULATORY_CARE_PROVIDER_SITE_OTHER): Payer: 59 | Admitting: Internal Medicine

## 2019-08-02 ENCOUNTER — Other Ambulatory Visit: Payer: Self-pay

## 2019-08-02 VITALS — BP 140/82 | HR 87 | Ht 70.0 in | Wt 254.0 lb

## 2019-08-02 DIAGNOSIS — E785 Hyperlipidemia, unspecified: Secondary | ICD-10-CM | POA: Diagnosis not present

## 2019-08-02 DIAGNOSIS — E1065 Type 1 diabetes mellitus with hyperglycemia: Secondary | ICD-10-CM | POA: Diagnosis not present

## 2019-08-02 LAB — POCT GLYCOSYLATED HEMOGLOBIN (HGB A1C): Hemoglobin A1C: 7.4 % — AB (ref 4.0–5.6)

## 2019-08-02 NOTE — Progress Notes (Signed)
Patient ID: Zachary Graham, male   DOB: 02-14-1961, 59 y.o.   MRN: [id number]   This visit occurred during the SARS-CoV-2 public health emergency.  Safety protocols were in place, including screening questions prior to the visit, additional usage of staff PPE, and extensive cleaning of exam room while observing appropriate contact time as indicated for disinfecting solutions.   HPI: Zachary Graham is a 59 y.o.-year-old male, presenting for f/u for DM1, dx 1993, insulin-dependent, uncontrolled, with complications (mild sensory peripheral neuropathy; hypoglycemia episodes, DR). Last visit 4 months ago.  He started on insulin pump in 2006.  He is on Medtronic 670 G insulin pump with the Dexcom G6 CGM (previously freestyle libre CGM).  He has Humalog in the morning..  Reviewed HbA1c levels: Lab Results  Component Value Date   HGBA1C 7.4 (A) 04/06/2019   HGBA1C 7.6 (A) 12/08/2018   HGBA1C 8.2 (H) 10/19/2018   HGBA1C 7.7 (A) 05/02/2018   HGBA1C 7.3 (A) 01/05/2018   HGBA1C 8.1 (A) 10/03/2017   HGBA1C 7.6 03/10/2017   HGBA1C 9.7 (H) 12/02/2016   HGBA1C 7.3 04/22/2016   HGBA1C 7.8 02/17/2016   HGBA1C 8.0 11/13/2015   HGBA1C 7.8 (H) 10/14/2015   HGBA1C 8.1 05/30/2015   HGBA1C 7.6 01/30/2015   HGBA1C 7.6 (H) 11/14/2014   HGBA1C 8.2 (H) 07/17/2014   HGBA1C 8.1 (H) 11/20/2013   HGBA1C 9.0 (H) 07/25/2013   HGBA1C 8.7 (H) 02/05/2013   HGBA1C 8.2 (H) 09/22/2012   Pump settings: (Bold - changes made at last visit, Red - changes made by patient since) - Basal rates: 12 AM: 2.8 >> 3.2 4 AM: 3.1  6:30 AM: 3.1 >> 3.3 8 AM: 2.85 >> 3.0 12 PM: 2.70  >> 3.0 4 PM: 2.95 >> 3.3 >> 3.2 9:00 PM: 2.85 >> 3.2 10:30 PM: 2.80 >> 3.2 - bolus: - Insulin to carb ratio:  12 AM: 2 11 AM: 2 5 PM: 2 - Insulin sensitivity factor:  13 - target: 100-120 - Active insulin time: 3 hours  TDD basal: 31% >> 47% >> 40% (66 units) >> 45% >> 39% (68 units) >> 74 units (36%) TDD bolus: 69% >> 53% >> 60% (97 units) >>  55% >> 61% (170 units) >> 131 units (64%) TDD 122 units >> 200-220 units - Bolus wizard: On - Changes the pump site: every 1-3 days  He is also on:  - Metformin 1000 mg 2x a day   We tried Trulicity in 72/5366 but he stopped in 02/2019 due to nausea and abdominal pain.  He checks his sugars more than 4 times a day with his Dexcom CGM (we will scan reports): - Average: 158 >> 174 >> 173 +/- 52 >> 161+/-45 - % active CGM time: 97% >> 98% >> 99.2% >> 98.3% of the time - Glucose variability 31.6% >> 29.8% >> 30.3% >> 28.1% (target < or = to 36%) - time in range:  - very low (<54): 0% >> 0% >> 0% - low (54-69): 1% >> 0% >> 1% >> 0.1% - normal range (70-180): 70% >> 58% >> 57.6% >> 72.5% - high sugars (181-250): 24% >> 34% >> 41.4% >> 27.4% - very high sugars (>250): 5% >> 8% >> 0% >> 5.8% >> 5%   Previously:  Previously:   He has a history of a severe hypoglycemia episode in 2015. He has an glucagon kit at home. No previous episodes of DKA.  -No CKD, last BUN/creatinine:  Lab Results  Component Value Date  BUN 11 05/08/2019   CREATININE 0.68 05/08/2019  04/11/2018: 10/0.778, ACR 11.1 On Cozaar. -+ HL; last set of lipids: Lab Results  Component Value Date   CHOL 99 10/19/2018   HDL 34.70 (L) 10/19/2018   LDLCALC 41 10/19/2018   LDLDIRECT 87.0 12/02/2016   TRIG 118.0 10/19/2018   CHOLHDL 3 10/19/2018  On Lipitor. - last eye exam was in 10/2018: + Mild NPDR. [facility].  -+ Numbness and tingling in his feet.  This improved on B complex.  He also has a history of GERD, HTN, OSA.  Latest TSH was normal: Lab Results  Component Value Date   TSH 2.54 10/19/2018  04/11/2018: TSH 3.02  Reviewed vitamin D levels: Lab Results  Component Value Date   VD25OH 68.22 10/19/2018   VD25OH 83.45 10/03/2017   VD25OH 41.95 06/06/2015   VD25OH 35.03 07/17/2014   VD25OH 44 03/01/2013  04/11/2018: vitamin D 46.7.   He was previously on ergocalciferol.  He has adhesive  capsulitis >> improving.  He is getting occasional steroid injections but we discussed to reduce these as much as possible. On Meloxicam.  He had a colonoscopy recently >> no pathology.  ROS: Constitutional: no weight gain/no weight loss, + fatigue, no subjective hyperthermia, no subjective hypothermia Eyes: no blurry vision, no xerophthalmia ENT: no sore throat, no nodules palpated in neck, no dysphagia, no odynophagia, no hoarseness Cardiovascular: no CP/no SOB/no palpitations/no leg swelling Respiratory: no cough/no SOB/no wheezing Gastrointestinal: no N/no V/no D/no C/no acid reflux Musculoskeletal: no muscle aches/+ joint aches Skin: no rashes, no hair loss Neurological: no tremors/+ numbness/+ tingling/no dizziness  I reviewed pt's medications, allergies, PMH, social hx, family hx, and changes were documented in the history of present illness. Otherwise, unchanged from my initial visit note.   Past Medical History:  Diagnosis Date  . Diabetes mellitus   . GERD (gastroesophageal reflux disease)   . Heart murmur   . Hyperlipidemia   . Hypertension   . OSA (obstructive sleep apnea)   . Pneumonia    Past Surgical History:  Procedure Laterality Date  . CERVICAL DISC ARTHROPLASTY N/A 03/16/2017   Procedure: CERVICAL FIVE- CERVICAL SIX [place] ARTHROPLASTY;  Surgeon: Jovita Gamma, MD;  Location: [facility];  Service: Neurosurgery;  Laterality: N/A;  CERVICAL 5- CERVICAL 6 DISC ARTHROPLASTY  . COLONOSCOPY    . TONSILLECTOMY     Social History   Socioeconomic History  . Marital status: Married    Spouse name: Not on file  . Number of children: 3  . Years of education: Not on file  . Highest education level: Not on file  Occupational History  . Occupation: RADIATION SAFETY OFFICER    Employer: [employer]  Tobacco Use  . Smoking status: Never Smoker  . Smokeless tobacco: Never Used  Substance and Sexual Activity  . Alcohol use: No    Alcohol/week: 0.0 standard drinks  .  Drug use: No  . Sexual activity: Yes    Partners: Female  Other Topics Concern  . Not on file  Social History Narrative   Regular exercise: runs 3 miles 3x week, takes one day off per week to rest   Caffeine use: stopped all diet soda on 04/23/15 per advice from Sports MD   Social Determinants of Health   Financial Resource Strain:   . Difficulty of Paying Living Expenses:   Food Insecurity:   . Worried About Charity fundraiser in the Last Year:   . Arboriculturist in  the Last Year:   Transportation Needs:   . Film/video editor (Medical):   Marland Kitchen Lack of Transportation (Non-Medical):   Physical Activity:   . Days of Exercise per Week:   . Minutes of Exercise per Session:   Stress:   . Feeling of Stress :   Social Connections:   . Frequency of Communication with Friends and Family:   . Frequency of Social Gatherings with Friends and Family:   . Attends Religious Services:   . Active Member of Clubs or Organizations:   . Attends Archivist Meetings:   Marland Kitchen Marital Status:   Intimate Partner Violence:   . Fear of Current or Ex-Partner:   . Emotionally Abused:   Marland Kitchen Physically Abused:   . Sexually Abused:    Current Outpatient Medications on File Prior to Visit  Medication Sig Dispense Refill  . ADDERALL XR 20 MG 24 hr capsule TAKE 1 CAPSULE BY MOUTH EVERY MORNING 30 capsule 0  . Alpha-Lipoic Acid 600 MG CAPS Take 1 capsule by mouth daily.     . Ascorbic Acid (VITAMIN C) 1000 MG tablet Take 1,000 mg by mouth daily.    Marland Kitchen aspirin EC 81 MG tablet Take 81 mg by mouth daily.    Marland Kitchen atorvastatin (LIPITOR) 80 MG tablet Take 80 mg by mouth daily.    Marland Kitchen azelastine (ASTELIN) 0.1 % nasal spray Place 2 sprays into both nostrils 2 (two) times daily. Use in each nostril as directed 90 mL 1  . b complex vitamins tablet Take 1 tablet by mouth daily.    Marland Kitchen BAYER MICROLET LANCETS lancets USE TO TEST BLOOD SUGAR 6 TIMES A DAY 300 each 11  . Beclomethasone Dipropionate (QNASL) 80 MCG/ACT  AERS Place 2 puffs into the nose daily as needed (allergies). 10.6 g 5  . Biotin 5000 MCG CAPS Take by mouth.    . Calcium-Magnesium-Vitamin D (CALCIUM 500 PO) Take 1 tablet by mouth daily.     Marland Kitchen glucagon (GLUCAGON EMERGENCY) 1 MG injection Inject 1 mg into the muscle once as needed. 2 each prn  . glucose blood (CONTOUR NEXT TEST) test strip USE AS INSTRUCTED TO CHECK SUGAR 6 TIMES DAILY 400 each 5  . levocetirizine (XYZAL) 5 MG tablet Take 1 tablet (5 mg total) by mouth every evening. 90 tablet 1  . losartan (COZAAR) 50 MG tablet TAKE 1 TABLET BY MOUTH ONCE DAILY 90 tablet 1  . Magnesium Oxide 400 MG CAPS Take 1 capsule (400 mg total) by mouth daily. 90 capsule 1  . meloxicam (MOBIC) 7.5 MG tablet Take 1 tablet (7.5 mg total) by mouth daily. 90 tablet 0  . metFORMIN (GLUCOPHAGE) 500 MG tablet TAKE 2 TABLETS BY MOUTH DAILY WITH SUPPER 180 tablet 1  . Misc Natural Products (TART CHERRY ADVANCED) CAPS     . Misc Natural Products (TURMERIC CURCUMIN) CAPS Take 1 capsule by mouth daily.     . NON FORMULARY CPAP    . pantoprazole (PROTONIX) 40 MG tablet TAKE 1 TABLET BY MOUTH EVERY DAILY 90 tablet 1  . SILVADENE 1 % cream APPLY 1 APPLICATION TOPICALLY DAILY. 50 g 0  . traZODone (DESYREL) 50 MG tablet TAKE 1/2-1 TABLET BY MOUTH AT BEDTIME AS NEEDED FOR SLEEP 30 tablet 3  . Vitamin D, Ergocalciferol, (DRISDOL) 1.25 MG (50000 UNIT) CAPS capsule TAKE 1 CAPSULE BY MOUTH EVERY 7 DAYS. 12 capsule 0   No current facility-administered medications on file prior to visit.   Allergies  Allergen Reactions  .  Lisinopril Swelling and Other (See Comments)    Extreme facial swelling causing hospitalization  . Shellfish Allergy Anaphylaxis  . Zetia [Ezetimibe] Other (See Comments)    ABDOMINAL CRAMPING  . Penicillins Other (See Comments)    UNSPECIFIED REACTION > Childhood allergy    . Cymbalta [Duloxetine Hcl] Other (See Comments)    Severe feet cramps   Family History  Problem Relation Age of Onset  .  Colon cancer Maternal Grandfather   . Colon cancer Paternal Grandfather   . Heart disease Father        Valve replacement; pacemaker  . Alcohol abuse Neg Hx   . Asthma Neg Hx   . Diabetes Neg Hx   . Hyperlipidemia Neg Hx   . Hypertension Neg Hx   . Kidney disease Neg Hx   . Stroke Neg Hx     PE: There were no vitals taken for this visit. There is no height or weight on file to calculate BMI.  Wt Readings from Last 3 Encounters:  05/08/19 246 lb (111.6 kg)  04/06/19 243 lb (110.2 kg)  03/21/19 239 lb (108.4 kg)   Constitutional: overweight, in NAD Eyes: PERRLA, EOMI, no exophthalmos ENT: moist mucous membranes, no thyromegaly, no cervical lymphadenopathy Cardiovascular: RRR, No MRG Respiratory: CTA B Gastrointestinal: abdomen soft, NT, ND, BS+ Musculoskeletal: no deformities, strength intact in all 4 Skin: moist, warm, no rashes Neurological: no tremor with outstretched hands, DTR normal in all 4  ASSESSMENT: 1. DM1, insulin-dependent, uncontrolled, without complications - DR - PN  -Insulin pump -Was on a Dexcom CGM and previously on Enlite CGM, which found less accurate >> retried this Summer 2017 >> still poor accuracy.  Currently on freestyle libre CGM.  - Barriers to good control:  Very busy schedule >> less time to check sugars and eat but But he does a great job with bolusing during the day >> still has a lot of large boluses during the day, sometimes without documented carbs or sugars  Lack of sleep >> usually sleeps max 5h a night (!) and tries to catch up in the weekend  Reaches home late at night  - sometimes at 1 am >> eats >65% of the daily calories then  2. HL  PLAN:  1. Patient with longstanding, uncontrolled, type 1 diabetes, with high insulin resistance, with improved control after adding a GLP-1 receptor agonist, however, we had to stop this in 02/2019 due to abdominal pain and nausea. -At last visit, he just switched to the Dexcom G6 CGM and he  continues using it.  He likes it.  Sugars appear to be improved, especially in the early morning, but his sugars are increasing even without eating breakfast, as he is driving to work.  At that time, sugars were still high throughout the day and even higher during the night.  We increased his basal rates then.  Afterwards, he had to increase some of them even more.   -He is now getting approximately 200 units of insulin a day.  Is doing a better job bolusing 15 minutes before a meal most of the time.  The majority of his calories are still at night, as he has dinner late, around 10 PM.  Is going to bed at 12 AM and sleeps approximately 4 hours a night.  He feels tired and has generalized aches and pains. -He tells me that has to do a correction of his blood sugars before actually bolusing for breakfast.  In fact, overall, his sugars  are high at night, spiking 3 to 4 hours after dinner.  I advised him to increase his basal rates throughout the night, but I do not feel the need to change the rest of the settings for now.  In the near future, will apparently start thinking about U500 insulin. - I advised him to:  Patient Instructions  Please use the following pump settings: - Basal rates: 12 AM: 3.2 >> 3.3 (if sugars still high, increase to 3.4) 4 AM: 3.1  6:30 AM: 3.3 8 AM: 3.0 12 PM: 3.0 4 PM: 3.2 9:00 PM: 3.2 10:30 PM: 3.2 >> 3.3 >> 3.3 (if sugars still high, increase to 3.4) - bolus:  - Insulin to carb ratio:  12 AM: 2 11 AM: 2 5 PM: 2 - Insulin sensitivity factor:  13 - target: 100-120 - Active insulin time: 3 hours  Start bolusing 15 minutes before each meal.  Please come back for a follow-up appointment in 3-4 months.  - we checked his HbA1c: 7.4% (stable) - advised to check sugars at different times of the day - 4x a day, rotating check times - advised for yearly eye exams >> he is UTD - return to clinic in 3-4 months  2. HL -Reviewed latest lipid panel from last summer: HDL  low, LDL at goal: Lab Results  Component Value Date   CHOL 99 10/19/2018   HDL 34.70 (L) 10/19/2018   LDLCALC 41 10/19/2018   LDLDIRECT 87.0 12/02/2016   TRIG 118.0 10/19/2018   CHOLHDL 3 10/19/2018  -He continues the statin without side effects    Philemon Kingdom, MD PhD [facility] End[facility]

## 2019-08-02 NOTE — Patient Instructions (Signed)
Please use the following pump settings: - Basal rates: 12 AM: 3.2 >> 3.3 (if sugars still high, increase to 3.4) 4 AM: 3.1  6:30 AM: 3.3 8 AM: 3.0 12 PM: 3.0 4 PM: 3.2 9:00 PM: 3.2 10:30 PM: 3.2 >> 3.3 >> 3.3 (if sugars still high, increase to 3.4) - bolus:  - Insulin to carb ratio:  12 AM: 2 11 AM: 2 5 PM: 2 - Insulin sensitivity factor:  13 - target: 100-120 - Active insulin time: 3 hours  Start bolusing 15 minutes before each meal.  Please come back for a follow-up appointment in 3-4 months.

## 2019-08-14 ENCOUNTER — Other Ambulatory Visit: Payer: Self-pay | Admitting: Internal Medicine

## 2019-08-14 DIAGNOSIS — F900 Attention-deficit hyperactivity disorder, predominantly inattentive type: Secondary | ICD-10-CM

## 2019-08-14 MED FILL — CONTOUR NEXT STRIPS: 66 days supply | Qty: 400 | Fill #1

## 2019-08-14 MED FILL — LEVOCETIRIZINE 5 MG TABLET: 5 | 90 days supply | Qty: 90 | Fill #1

## 2019-08-18 ENCOUNTER — Emergency Department (HOSPITAL_BASED_OUTPATIENT_CLINIC_OR_DEPARTMENT_OTHER)
Admission: EM | Admit: 2019-08-18 | Discharge: 2019-08-18 | Disposition: A | Discharge status: Home / Self Care | Payer: 59 | Attending: Emergency Medicine | Admitting: Emergency Medicine

## 2019-08-18 ENCOUNTER — Encounter (HOSPITAL_BASED_OUTPATIENT_CLINIC_OR_DEPARTMENT_OTHER): Payer: Self-pay | Admitting: Emergency Medicine

## 2019-08-18 ENCOUNTER — Other Ambulatory Visit: Payer: Self-pay

## 2019-08-18 DIAGNOSIS — S79922A Unspecified injury of left thigh, initial encounter: Secondary | ICD-10-CM | POA: Diagnosis present

## 2019-08-18 DIAGNOSIS — S70362A Insect bite (nonvenomous), left thigh, initial encounter: Secondary | ICD-10-CM | POA: Diagnosis not present

## 2019-08-18 DIAGNOSIS — E1040 Type 1 diabetes mellitus with diabetic neuropathy, unspecified: Secondary | ICD-10-CM | POA: Diagnosis not present

## 2019-08-18 DIAGNOSIS — W57XXXA Bitten or stung by nonvenomous insect and other nonvenomous arthropods, initial encounter: Secondary | ICD-10-CM | POA: Diagnosis not present

## 2019-08-18 DIAGNOSIS — Y998 Other external cause status: Secondary | ICD-10-CM | POA: Insufficient documentation

## 2019-08-18 DIAGNOSIS — Y92017 Garden or yard in single-family (private) house as the place of occurrence of the external cause: Secondary | ICD-10-CM | POA: Insufficient documentation

## 2019-08-18 DIAGNOSIS — Y93H2 Activity, gardening and landscaping: Secondary | ICD-10-CM | POA: Diagnosis not present

## 2019-08-18 DIAGNOSIS — Z7984 Long term (current) use of oral hypoglycemic drugs: Secondary | ICD-10-CM | POA: Insufficient documentation

## 2019-08-18 DIAGNOSIS — Z7982 Long term (current) use of aspirin: Secondary | ICD-10-CM | POA: Insufficient documentation

## 2019-08-18 DIAGNOSIS — I10 Essential (primary) hypertension: Secondary | ICD-10-CM | POA: Diagnosis not present

## 2019-08-18 DIAGNOSIS — Z79899 Other long term (current) drug therapy: Secondary | ICD-10-CM | POA: Diagnosis not present

## 2019-08-18 DIAGNOSIS — F909 Attention-deficit hyperactivity disorder, unspecified type: Secondary | ICD-10-CM | POA: Diagnosis not present

## 2019-08-18 DIAGNOSIS — S70362D Insect bite (nonvenomous), left thigh, subsequent encounter: Secondary | ICD-10-CM | POA: Diagnosis not present

## 2019-08-18 NOTE — ED Triage Notes (Signed)
States that he has a tick embedded into his left thigh and all attempts to remove it have not bee successful

## 2019-08-18 NOTE — Discharge Instructions (Addendum)
You were seen in the emergency department for removal of the tach.  Please wash the area with soap and water and watch for any unusual rashes or fever.  Follow-up with your doctor or return to the emergency department if any worsening or concerning symptoms.

## 2019-08-18 NOTE — ED Provider Notes (Signed)
[name] EMERGENCY DEPARTMENT Provider Note   CSN: [id number] Arrival date & time: 08/18/19  1902     History Chief Complaint  Patient presents with  . Tick Removal    Zachary Graham is a 59 y.o. male.  He is complaining of a tick embedded in his left thigh today.  He said he was doing yard work and then when he came in he noticed it on his thigh.  He tried to heat up tweezers and also put some rubbing alcohol on it without success.  No other complaints.  The history is provided by the patient.  Animal Bite Attacking animal: tick. Location:  Leg Leg injury location:  L upper leg Pain details:    Quality:  Itching   Severity:  No pain   Timing:  Constant   Progression:  Unchanged Incident location:  Outside Relieved by:  Nothing Worsened by:  Nothing Associated symptoms: no fever, no rash and no swelling        Past Medical History:  Diagnosis Date  . Diabetes mellitus   . GERD (gastroesophageal reflux disease)   . Heart murmur   . Hyperlipidemia   . Hypertension   . OSA (obstructive sleep apnea)   . Pneumonia     Patient Active Problem List   Diagnosis Date Noted  . 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 Bilateral hearing loss 05/08/2019  . 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 HNP (herniated nucleus pulposus), cervical 03/16/2017  . 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 Herniation of cervical intervertebral disc with radiculopathy 01/04/2017  . Current mild episode of major depressive disorder without prior episode ([place]) 12/02/2016  . Diabetic neuropathy ([place]) 03/01/2016  . Obesity, Class II, BMI 35.0-39.9, with comorbidity (see actual BMI) 04/24/2015  . Type 1 diabetes mellitus with hyperglycemia, with long-term current use of insulin ([place]) 04/04/2015  . Patellofemoral stress syndrome  of left knee 03/19/2015  . Allergic rhinitis due to pollen 11/14/2014  . Equinus deformity of foot, acquired 05/29/2014  . ADHD (attention deficit hyperactivity disorder), inattentive type 10/24/2013  . Vitamin D deficiency 02/28/2013  . Pulmonic stenosis 05/25/2012  . Pure hyperglyceridemia 05/25/2012  . Hyperlipidemia with target LDL less than 100 09/20/2011  . OSA (obstructive sleep apnea) 09/20/2011  . BPH (benign prostatic hyperplasia) 09/20/2011  . Routine general medical examination at a health care facility 09/20/2011    Past Surgical History:  Procedure Laterality Date  . CERVICAL DISC ARTHROPLASTY N/A 03/16/2017   Procedure: CERVICAL FIVE- CERVICAL SIX [place] ARTHROPLASTY;  Surgeon: Jovita Gamma, MD;  Location: [facility];  Service: Neurosurgery;  Laterality: N/A;  CERVICAL 5- CERVICAL 6 DISC ARTHROPLASTY  . COLONOSCOPY    . TONSILLECTOMY         Family History  Problem Relation Age of Onset  . Colon cancer Maternal Grandfather   . Colon cancer Paternal Grandfather   . Heart disease Father        Valve replacement; pacemaker  . Alcohol abuse Neg Hx   . Asthma Neg Hx   . Diabetes Neg Hx   . Hyperlipidemia Neg Hx   . Hypertension Neg Hx   . Kidney disease Neg Hx   . Stroke Neg Hx     Social History   Tobacco Use  . Smoking status: Never Smoker  . Smokeless tobacco: Never Used  Substance Use Topics  .  Alcohol use: No    Alcohol/week: 0.0 standard drinks  . Drug use: No    Home Medications Prior to Admission medications   Medication Sig Start Date End Date Taking? Authorizing Provider  ADDERALL XR 20 MG 24 hr capsule TAKE 1 CAPSULE BY MOUTH EVERY MORNING 08/14/19   [name], MD  Alpha-Lipoic Acid 600 MG CAPS Take 1 capsule by mouth daily.     [provider]  Ascorbic Acid (VITAMIN C) 1000 MG tablet Take 1,000 mg by mouth daily.    [provider]  aspirin EC 81 MG tablet Take 81 mg by mouth daily.    [provider]   atorvastatin (LIPITOR) 80 MG tablet Take 80 mg by mouth daily.    [provider]  azelastine (ASTELIN) 0.1 % nasal spray Place 2 sprays into both nostrils 2 (two) times daily. Use in each nostril as directed 05/08/19   [name], MD  b complex vitamins tablet Take 1 tablet by mouth daily.    [provider]  BAYER MICROLET LANCETS lancets USE TO TEST BLOOD SUGAR 6 TIMES A DAY 09/13/17   Philemon Kingdom, MD  Beclomethasone Dipropionate (QNASL) 80 MCG/ACT AERS Place 2 puffs into the nose daily as needed (allergies). 08/17/18   [name], MD  Biotin 5000 MCG CAPS Take by mouth.    [provider]  Calcium-Magnesium-Vitamin D (CALCIUM 500 PO) Take 1 tablet by mouth daily.     [provider]  glucagon (GLUCAGON EMERGENCY) 1 MG injection Inject 1 mg into the muscle once as needed. 01/05/18   Philemon Kingdom, MD  glucose blood (CONTOUR NEXT TEST) test strip USE AS INSTRUCTED TO CHECK SUGAR 6 TIMES DAILY 12/07/18   Philemon Kingdom, MD  levocetirizine (XYZAL) 5 MG tablet Take 1 tablet (5 mg total) by mouth every evening. 05/08/19   [name], MD  losartan (COZAAR) 50 MG tablet TAKE 1 TABLET BY MOUTH ONCE DAILY 04/30/19   [name], MD  Magnesium Oxide 400 MG CAPS Take 1 capsule (400 mg total) by mouth daily. 06/08/18   [name], MD  meloxicam (MOBIC) 7.5 MG tablet Take 1 tablet (7.5 mg total) by mouth daily. 05/08/19   [name], MD  metFORMIN (GLUCOPHAGE) 500 MG tablet TAKE 2 TABLETS BY MOUTH DAILY WITH SUPPER 09/25/18   [name], MD  Misc Natural Products (TART CHERRY ADVANCED) CAPS  07/16/18   [provider]  Misc Natural Products (TURMERIC CURCUMIN) CAPS Take 1 capsule by mouth daily.     [provider]  NON FORMULARY CPAP    [provider]  pantoprazole (PROTONIX) 40 MG tablet TAKE 1 TABLET BY MOUTH EVERY DAILY 05/23/19   [name], MD  SILVADENE 1 % cream APPLY 1 APPLICATION TOPICALLY  DAILY. 01/11/18   Philemon Kingdom, MD  traZODone (DESYREL) 50 MG tablet TAKE 1/2-1 TABLET BY MOUTH AT BEDTIME AS NEEDED FOR SLEEP 06/19/19   [name], DO  Vitamin D, Ergocalciferol, (DRISDOL) 1.25 MG (50000 UNIT) CAPS capsule TAKE 1 CAPSULE BY MOUTH EVERY 7 DAYS. 06/19/19   [name], DO    Allergies    Lisinopril, Shellfish allergy, Zetia [ezetimibe], Penicillins, and Cymbalta [duloxetine hcl]  Review of Systems   Review of Systems  Constitutional: Negative for fever.  Skin: Negative for rash and wound.    Physical Exam Updated Vital Signs BP 131/71 (BP Location: Left Arm)   Pulse 97  Temp 98.7 F (37.1 C) (Oral)   Resp 18   Ht 5\' 10"  (1.778 m)   Wt 115.2 kg   SpO2 100%   BMI 36.44 kg/m   Physical Exam Vitals and nursing note reviewed.  Constitutional:      Appearance: He is well-developed.  HENT:     Head: Normocephalic and atraumatic.  Eyes:     Conjunctiva/sclera: Conjunctivae normal.  Pulmonary:     Effort: Pulmonary effort is normal.  Musculoskeletal:     Cervical back: Neck supple.     Comments: Small tick to left upper lateral thigh.  Removed intact.  No surrounding erythema.  Skin:    General: Skin is warm and dry.     Capillary Refill: Capillary refill takes less than 2 seconds.     Findings: No rash.  Neurological:     Mental Status: He is alert.     GCS: GCS eye subscore is 4. GCS verbal subscore is 5. GCS motor subscore is 6.     ED Results / Procedures / Treatments   Labs (all labs ordered are listed, but only abnormal results are displayed) Labs Reviewed - No data to display  EKG None  Radiology No results found.  Procedures .Foreign Body Removal  Date/Time: 08/18/2019 8:23 PM Performed by: Hayden Rasmussen, MD Authorized by: Hayden Rasmussen, MD  Consent: Verbal consent obtained. Consent given by: patient Body area: skin General location: lower extremity Location details: left upper leg 1 objects recovered. Objects  recovered: tack Post-procedure assessment: foreign body removed Patient tolerance: patient tolerated the procedure well with no immediate complications   (including critical care time)  Medications Ordered in ED Medications - No data to display  ED Course  I have reviewed the triage vital signs and the nursing notes.  Pertinent labs & imaging results that were available during my care of the patient were reviewed by me and considered in my medical decision making (see chart for details).    MDM Rules/Calculators/A&P                       Final Clinical Impression(s) / ED Diagnoses Final diagnoses:  Tick bite with subsequent removal of tick    Rx / DC Orders ED Discharge Orders    None       Hayden Rasmussen, MD 08/19/19 1016

## 2019-08-27 ENCOUNTER — Other Ambulatory Visit: Payer: Self-pay | Admitting: Internal Medicine

## 2019-08-27 DIAGNOSIS — G8929 Other chronic pain: Secondary | ICD-10-CM

## 2019-08-27 DIAGNOSIS — R102 Pelvic and perineal pain: Secondary | ICD-10-CM

## 2019-08-27 DIAGNOSIS — N411 Chronic prostatitis: Secondary | ICD-10-CM

## 2019-08-27 MED FILL — traZODone HCL 50 MG TABS: 50 | 30 days supply | Qty: 30 | Fill #2

## 2019-08-27 MED FILL — ADDERALL XR 20 MG CAP SA: 20 | 30 days supply | Qty: 30 | Fill #0

## 2019-08-27 MED FILL — LOSARTAN POTASSIUM 50 MG TA: 50 | 90 days supply | Qty: 90 | Fill #1

## 2019-08-27 MED FILL — MELOXICAM 7.5 MG TABLET: 7.5 | 90 days supply | Qty: 90 | Fill #0

## 2019-09-24 ENCOUNTER — Other Ambulatory Visit: Payer: Self-pay | Admitting: Family Medicine

## 2019-10-12 DIAGNOSIS — G4733 Obstructive sleep apnea (adult) (pediatric): Secondary | ICD-10-CM | POA: Diagnosis not present

## 2019-10-22 ENCOUNTER — Other Ambulatory Visit: Payer: Self-pay | Admitting: Internal Medicine

## 2019-10-22 DIAGNOSIS — R102 Pelvic and perineal pain: Secondary | ICD-10-CM

## 2019-10-22 DIAGNOSIS — F900 Attention-deficit hyperactivity disorder, predominantly inattentive type: Secondary | ICD-10-CM

## 2019-10-22 DIAGNOSIS — J301 Allergic rhinitis due to pollen: Secondary | ICD-10-CM

## 2019-10-22 DIAGNOSIS — G8929 Other chronic pain: Secondary | ICD-10-CM

## 2019-10-22 DIAGNOSIS — N411 Chronic prostatitis: Secondary | ICD-10-CM

## 2019-10-23 ENCOUNTER — Ambulatory Visit (INDEPENDENT_AMBULATORY_CARE_PROVIDER_SITE_OTHER): Payer: 59 | Admitting: Internal Medicine

## 2019-10-23 ENCOUNTER — Encounter: Payer: Self-pay | Admitting: Internal Medicine

## 2019-10-23 ENCOUNTER — Other Ambulatory Visit: Payer: Self-pay

## 2019-10-23 ENCOUNTER — Other Ambulatory Visit: Payer: Self-pay | Admitting: Internal Medicine

## 2019-10-23 ENCOUNTER — Other Ambulatory Visit: Payer: 59

## 2019-10-23 VITALS — BP 136/72 | HR 75 | Temp 98.1°F | Ht 70.0 in | Wt 248.2 lb

## 2019-10-23 DIAGNOSIS — Z125 Encounter for screening for malignant neoplasm of prostate: Secondary | ICD-10-CM | POA: Diagnosis not present

## 2019-10-23 DIAGNOSIS — R102 Pelvic and perineal pain: Secondary | ICD-10-CM

## 2019-10-23 DIAGNOSIS — N411 Chronic prostatitis: Secondary | ICD-10-CM | POA: Diagnosis not present

## 2019-10-23 DIAGNOSIS — E785 Hyperlipidemia, unspecified: Secondary | ICD-10-CM

## 2019-10-23 DIAGNOSIS — E781 Pure hyperglyceridemia: Secondary | ICD-10-CM

## 2019-10-23 DIAGNOSIS — J301 Allergic rhinitis due to pollen: Secondary | ICD-10-CM

## 2019-10-23 DIAGNOSIS — I493 Ventricular premature depolarization: Secondary | ICD-10-CM

## 2019-10-23 DIAGNOSIS — F900 Attention-deficit hyperactivity disorder, predominantly inattentive type: Secondary | ICD-10-CM | POA: Diagnosis not present

## 2019-10-23 DIAGNOSIS — G8929 Other chronic pain: Secondary | ICD-10-CM | POA: Diagnosis not present

## 2019-10-23 DIAGNOSIS — F320 Major depressive disorder, single episode, mild: Secondary | ICD-10-CM | POA: Diagnosis not present

## 2019-10-23 DIAGNOSIS — E1065 Type 1 diabetes mellitus with hyperglycemia: Secondary | ICD-10-CM | POA: Diagnosis not present

## 2019-10-23 DIAGNOSIS — Z0000 Encounter for general adult medical examination without abnormal findings: Secondary | ICD-10-CM

## 2019-10-23 DIAGNOSIS — E559 Vitamin D deficiency, unspecified: Secondary | ICD-10-CM

## 2019-10-23 LAB — BASIC METABOLIC PANEL
BUN: 15 mg/dL (ref 6–23)
CO2: 26 mEq/L (ref 19–32)
Calcium: 9.4 mg/dL (ref 8.4–10.5)
Chloride: 100 mEq/L (ref 96–112)
Creatinine, Ser: 0.81 mg/dL (ref 0.40–1.50)
GFR: 98.38 mL/min (ref >60.00)
Glucose, Bld: 207 mg/dL — ABNORMAL HIGH (ref 70–99)
Potassium: 4.5 mEq/L (ref 3.5–5.1)
Sodium: 135 mEq/L (ref 135–145)

## 2019-10-23 LAB — HEPATIC FUNCTION PANEL
ALT: 24 U/L (ref 0–53)
AST: 16 U/L (ref 0–37)
Albumin: 4.2 g/dL (ref 3.5–5.2)
Alkaline Phosphatase: 90 U/L (ref 39–117)
Bilirubin, Direct: 0.1 mg/dL (ref 0.0–0.3)
Total Bilirubin: 0.7 mg/dL (ref 0.2–1.2)
Total Protein: 6.2 g/dL (ref 6.0–8.3)

## 2019-10-23 LAB — CBC WITH DIFFERENTIAL/PLATELET
Basophils Absolute: 0.1 10*3/uL (ref 0.0–0.1)
Basophils Relative: 0.9 % (ref 0.0–3.0)
Eosinophils Absolute: 0.5 10*3/uL (ref 0.0–0.7)
Eosinophils Relative: 5.4 % — ABNORMAL HIGH (ref 0.0–5.0)
HCT: 39.5 % (ref 39.0–52.0)
Hemoglobin: 13.3 g/dL (ref 13.0–17.0)
Lymphocytes Relative: 25.4 % (ref 12.0–46.0)
Lymphs Abs: 2.2 10*3/uL (ref 0.7–4.0)
MCHC: 33.6 g/dL (ref 30.0–36.0)
MCV: 84.4 fl (ref 78.0–100.0)
Monocytes Absolute: 0.7 10*3/uL (ref 0.1–1.0)
Monocytes Relative: 8.4 % (ref 3.0–12.0)
Neutro Abs: 5.1 10*3/uL (ref 1.4–7.7)
Neutrophils Relative %: 59.9 % (ref 43.0–77.0)
Platelets: 214 10*3/uL (ref 150.0–400.0)
RBC: 4.67 Mil/uL (ref 4.22–5.81)
RDW: 14.1 % (ref 11.5–15.5)
WBC: 8.5 10*3/uL (ref 4.0–10.5)

## 2019-10-23 LAB — LIPID PANEL
Cholesterol: 118 mg/dL (ref 0–200)
HDL: 36.9 mg/dL — ABNORMAL LOW (ref >39.00)
LDL Cholesterol: 57 mg/dL (ref 0–99)
NonHDL: 81.5
Total CHOL/HDL Ratio: 3
Triglycerides: 124 mg/dL (ref 0.0–149.0)
VLDL: 24.8 mg/dL (ref 0.0–40.0)

## 2019-10-23 LAB — MICROALBUMIN / CREATININE URINE RATIO
Creatinine,U: 148.3 mg/dL
Microalb Creat Ratio: 1 mg/g (ref 0.0–30.0)
Microalb, Ur: 1.5 mg/dL (ref 0.0–1.9)

## 2019-10-23 LAB — PSA: PSA: 0.24 ng/mL (ref 0.10–4.00)

## 2019-10-23 LAB — VITAMIN D 25 HYDROXY (VIT D DEFICIENCY, FRACTURES): VITD: 59.85 ng/mL (ref 30.00–100.00)

## 2019-10-23 LAB — TSH: TSH: 2.98 u[IU]/mL (ref 0.35–4.50)

## 2019-10-23 MED ORDER — TRAZODONE HCL 50 MG PO TABS: 50.0000 mg | ORAL_TABLET | Freq: Every day | ORAL | 3 refills | Status: DC

## 2019-10-23 MED ORDER — LEVOCETIRIZINE DIHYDROCHLORIDE 5 MG PO TABS: 5.0000 mg | ORAL_TABLET | Freq: Every evening | ORAL | 1 refills | Status: DC

## 2019-10-23 MED ORDER — AMPHETAMINE-DEXTROAMPHET ER 20 MG PO CP24: 20.0000 mg | ORAL_CAPSULE | Freq: Every morning | ORAL | 0 refills | Status: DC

## 2019-10-23 NOTE — Patient Instructions (Signed)

## 2019-10-23 NOTE — Progress Notes (Signed)
Subjective:  Patient ID: Zachary Graham, male    DOB: [birth year]  Age: 59 y.o. MRN: [id number]  CC: Annual Exam  This visit occurred during the SARS-CoV-2 public health emergency.  Safety protocols were in place, including screening questions prior to the visit, additional usage of staff PPE, and extensive cleaning of exam room while observing appropriate contact time as indicated for disinfecting solutions.    HPI Zachary Graham presents for a CPX.  He tells me that the pelvic pain has resolved.  He denies dysuria, hematuria, bloody stools, or melena.  He has had PVCs all of his adult life.  He is not aware of them and does not experience palpitations.  He is active and denies any recent episodes of DOE, chest pain, shortness of breath, edema, near syncope, or fatigue.   Pt states ADD status overall stable on current meds with overall good compliance and tolerability, and good effectiveness with respect to ability for concentration and task completion.  Outpatient Medications Prior to Visit  Medication Sig Dispense Refill   Alpha-Lipoic Acid 600 MG CAPS Take 1 capsule by mouth daily.      Ascorbic Acid (VITAMIN C) 1000 MG tablet Take 1,000 mg by mouth daily.     aspirin EC 81 MG tablet Take 81 mg by mouth daily.     atorvastatin (LIPITOR) 80 MG tablet Take 80 mg by mouth daily.     azelastine (ASTELIN) 0.1 % nasal spray Place 2 sprays into both nostrils 2 (two) times daily. Use in each nostril as directed 90 mL 1   b complex vitamins tablet Take 1 tablet by mouth daily.     BAYER MICROLET LANCETS lancets USE TO TEST BLOOD SUGAR 6 TIMES A DAY 300 each 11   Beclomethasone Dipropionate (QNASL) 80 MCG/ACT AERS Place 2 puffs into the nose daily as needed (allergies). 10.6 g 5   Biotin 5000 MCG CAPS Take by mouth.     Calcium-Magnesium-Vitamin D (CALCIUM 500 PO) Take 1 tablet by mouth daily.      glucagon (GLUCAGON EMERGENCY) 1 MG injection Inject 1 mg into the muscle once as  needed. 2 each prn   glucose blood (CONTOUR NEXT TEST) test strip USE AS INSTRUCTED TO CHECK SUGAR 6 TIMES DAILY 400 each 5   HUMALOG 100 UNIT/ML injection FOR USE IN INSULIN PUMP, TOTAL 250 UNITS PER DAY 120 mL 11   losartan (COZAAR) 50 MG tablet TAKE 1 TABLET BY MOUTH ONCE DAILY 90 tablet 1   Magnesium Oxide 400 MG CAPS Take 1 capsule (400 mg total) by mouth daily. 90 capsule 1   metFORMIN (GLUCOPHAGE) 500 MG tablet TAKE 2 TABLETS BY MOUTH DAILY WITH SUPPER 180 tablet 1   Misc Natural Products (TART CHERRY ADVANCED) CAPS      Misc Natural Products (TURMERIC CURCUMIN) CAPS Take 1 capsule by mouth daily.      NON FORMULARY CPAP     pantoprazole (PROTONIX) 40 MG tablet TAKE 1 TABLET BY MOUTH EVERY DAILY 90 tablet 1   SILVADENE 1 % cream APPLY 1 APPLICATION TOPICALLY DAILY. 50 g 0   Vitamin D, Ergocalciferol, (DRISDOL) 1.25 MG (50000 UNIT) CAPS capsule TAKE 1 CAPSULE BY MOUTH EVERY 7 DAYS. 12 capsule 0   ADDERALL XR 20 MG 24 hr capsule TAKE 1 CAPSULE BY MOUTH EVERY MORNING 30 capsule 0   levocetirizine (XYZAL) 5 MG tablet TAKE 1 TABLET BY MOUTH EVERY EVENING 90 tablet 1   meloxicam (MOBIC) 7.5 MG tablet TAKE  1 TABLET BY MOUTH ONCE DAILY 90 tablet 0   traZODone (DESYREL) 50 MG tablet TAKE 1/2-1 TABLET BY MOUTH AT BEDTIME AS NEEDED FOR SLEEP 30 tablet 3   No facility-administered medications prior to visit.    ROS Review of Systems  Constitutional: Negative for appetite change, diaphoresis, fatigue and unexpected weight change.  HENT: Negative.   Eyes: Negative.   Respiratory: Negative for cough, chest tightness, shortness of breath and wheezing.   Cardiovascular: Negative for chest pain, palpitations and leg swelling.  Gastrointestinal: Negative for abdominal pain, constipation, diarrhea, nausea and vomiting.  Endocrine: Negative.   Genitourinary: Negative.  Negative for difficulty urinating, dysuria, hematuria, penile swelling, scrotal swelling and testicular pain.   Musculoskeletal: Negative.  Negative for arthralgias and myalgias.  Skin: Negative.   Neurological: Negative.  Negative for dizziness, weakness and light-headedness.  Hematological: Negative for adenopathy. Does not bruise/bleed easily.  Psychiatric/Behavioral: Positive for dysphoric mood. Negative for sleep disturbance and suicidal ideas. The patient is nervous/anxious.     Objective:  BP 136/72 (BP Location: Left Arm, Patient Position: Sitting, Cuff Size: Large)    Pulse 75    Temp 98.1 F (36.7 C) (Oral)    Ht 5\' 10"  (1.778 m)    Wt 248 lb 4 oz (112.6 kg)    SpO2 97%    BMI 35.62 kg/m   BP Readings from Last 3 Encounters:  10/23/19 136/72  08/18/19 131/71  08/02/19 140/82    Wt Readings from Last 3 Encounters:  10/23/19 248 lb 4 oz (112.6 kg)  08/18/19 253 lb 15.5 oz (115.2 kg)  08/02/19 254 lb (115.2 kg)    Physical Exam Vitals reviewed. Exam conducted with a chaperone present [name]).  Constitutional:      Appearance: Normal appearance.  HENT:     Nose: Nose normal.     Mouth/Throat:     Mouth: Mucous membranes are moist.  Eyes:     General: No scleral icterus.    Conjunctiva/sclera: Conjunctivae normal.  Cardiovascular:     Rate and Rhythm: Normal rate and regular rhythm. Occasional extrasystoles are present.    Heart sounds: Normal heart sounds, S1 normal and S2 normal. No murmur. No gallop.      Comments: EKG - Sinus rhythm with 3 unifocal PVC's Otherwise normal EKG Pulmonary:     Effort: Pulmonary effort is normal.     Breath sounds: No stridor. No wheezing, rhonchi or rales.  Abdominal:     General: Abdomen is protuberant. Bowel sounds are normal. There is no distension.     Palpations: Abdomen is soft. There is no hepatomegaly, splenomegaly or mass.     Tenderness: There is no abdominal tenderness.     Hernia: No hernia is present. There is no hernia in the left inguinal area or right inguinal area.  Genitourinary:    Pubic Area: No rash.       Penis: Normal and circumcised. No discharge, swelling or lesions.      Testes: Normal.        Right: Mass, tenderness or swelling not present.        Left: Mass, tenderness or swelling not present.     Epididymis:     Right: Normal. Not inflamed or enlarged. No mass.     Left: Normal. Not inflamed or enlarged. No mass.     Prostate: Normal. Not enlarged, not tender and no nodules present.     Rectum: Normal. Guaiac result negative. No mass, tenderness, anal fissure, external  hemorrhoid or internal hemorrhoid. Normal anal tone.  Musculoskeletal:     Cervical back: Neck supple.     Right lower leg: Edema (trace pitting) present.     Left lower leg: Edema (trace pitting) present.  Lymphadenopathy:     Cervical: No cervical adenopathy.     Lower Body: No right inguinal adenopathy. No left inguinal adenopathy.  Skin:    General: Skin is warm and dry.  Neurological:     General: No focal deficit present.     Mental Status: He is alert.  Psychiatric:        Mood and Affect: Mood normal.        Behavior: Behavior normal.        Thought Content: Thought content normal.        Judgment: Judgment normal.     Lab Results  Component Value Date   WBC 8.0 06/08/2018   HGB 13.8 06/08/2018   HCT 40.4 06/08/2018   PLT 224.0 06/08/2018   GLUCOSE 145 (H) 05/08/2019   CHOL 99 10/19/2018   TRIG 118.0 10/19/2018   HDL 34.70 (L) 10/19/2018   LDLDIRECT 87.0 12/02/2016   LDLCALC 41 10/19/2018   ALT 23 10/19/2018   AST 16 10/19/2018   NA 138 05/08/2019   K 3.8 05/08/2019   CL 101 05/08/2019   CREATININE 0.68 05/08/2019   BUN 11 05/08/2019   CO2 26 05/08/2019   TSH 2.54 10/19/2018   PSA 0.31 10/19/2018   HGBA1C 7.4 (A) 08/02/2019   MICROALBUR 1.3 10/19/2018    No results found.  Assessment & Plan:   Constant was seen today for annual exam.  Diagnoses and all orders for this visit:  Current mild episode of major depressive disorder without prior episode ([place])- Will continue the current  dose of trazodone. -     traZODone (DESYREL) 50 MG tablet; Take 1 tablet (50 mg total) by mouth at bedtime. -     TSH; Future -     TSH  ADHD (attention deficit hyperactivity disorder), inattentive type- He is doing well on the current dose of Adderall.  Will continue. -     amphetamine-dextroamphetamine (ADDERALL XR) 20 MG 24 hr capsule; Take 1 capsule (20 mg total) by mouth every morning.  Chronic prostatitis without hematuria- The symptoms have resolved and he is comfortable not taking the anti-inflammatory anymore for the discomfort. -     Urinalysis, Routine w reflex microscopic; Future -     Urinalysis, Routine w reflex microscopic  Chronic male pelvic pain- See above. -     Urinalysis, Routine w reflex microscopic; Future -     Urinalysis, Routine w reflex microscopic  Seasonal allergic rhinitis due to pollen -     levocetirizine (XYZAL) 5 MG tablet; Take 1 tablet (5 mg total) by mouth every evening.  Pure hyperglyceridemia- His triglycerides are normal now. -     Hepatic function panel; Future -     Hepatic function panel  Routine general medical examination at a health care facility- Exam completed, labs reviewed, vaccines reviewed and updated, cancer screenings are up-to-date, patient education was given. -     Lipid panel; Future -     PSA; Future -     PSA -     Lipid panel  Vitamin D deficiency- His vitamin D level is normal now. 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  Does not require any further evaluation or treatment. -     EKG 12-Lead -     TSH; Future -     TSH  Type 1 diabetes mellitus with hyperglycemia, with long-term current use of insulin ([place])- His blood sugars have been adequately well controlled. -     CBC with Differential/Platelet; Future -     Basic metabolic panel; Future -     Microalbumin  / creatinine urine ratio; Future -     Microalbumin / creatinine urine ratio -     Basic metabolic panel -     CBC with Differential/Platelet -     HM Diabetes Foot Exam  Hyperlipidemia with target LDL less than 100- He has achieved his LDL goal is doing well on the statin. -     TSH; Future -     TSH   I have discontinued Elta [place] D. Teas's meloxicam. I have changed his Adderall XR to amphetamine-dextroamphetamine. I have also changed his levocetirizine and traZODone. Additionally, I am having him maintain his Turmeric Curcumin, Calcium-Magnesium-Vitamin D (CALCIUM 500 PO), vitamin C, Alpha-Lipoic Acid, b complex vitamins, aspirin EC, NON FORMULARY, Bayer Microlet Lancets, glucagon, Silvadene, Magnesium Oxide, Biotin, atorvastatin, Beclomethasone Dipropionate, metFORMIN, Contour Next Test, losartan, Tart Cherry Advanced, azelastine, pantoprazole, Vitamin D (Ergocalciferol), and HumaLOG.  Meds ordered this encounter  Medications   amphetamine-dextroamphetamine (ADDERALL XR) 20 MG 24 hr capsule    Sig: Take 1 capsule (20 mg total) by mouth every morning.    Dispense:  30 capsule    Refill:  0   levocetirizine (XYZAL) 5 MG tablet    Sig: Take 1 tablet (5 mg total) by mouth every evening.    Dispense:  90 tablet    Refill:  1   traZODone (DESYREL) 50 MG tablet    Sig: Take 1 tablet (50 mg total) by mouth at bedtime.    Dispense:  90 tablet    Refill:  3   In addition to time spent on CPE, I spent 50 minutes in preparing to see the patient by review of recent labs, imaging and procedures, obtaining and reviewing separately obtained history, communicating with the patient and family or caregiver, ordering medications, tests or procedures, and documenting clinical information in the EHR including the differential Dx, treatment, and any further evaluation and other management of 1. ADHD (attention deficit hyperactivity disorder), inattentive type 2. 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  Chronic male pelvic pain 4. Seasonal allergic rhinitis due to pollen 5. Current mild episode of major depressive disorder without prior episode ([place]) 6. Pure hyperglyceridemia 7. Vitamin D deficiency 8. Unifocal PVCs 9. Type 1 diabetes mellitus with hyperglycemia, with long-term current use of insulin ([facility]) 10. Hyperlipidemia with target LDL less than 100     Follow-up: Return in about 6 months (around 04/23/2020).  Scarlette Calico, MD

## 2019-10-24 ENCOUNTER — Encounter: Payer: Self-pay | Admitting: Internal Medicine

## 2019-10-24 LAB — URINALYSIS, ROUTINE W REFLEX MICROSCOPIC
Bilirubin Urine: NEGATIVE
Hgb urine dipstick: NEGATIVE
Ketones, ur: NEGATIVE
Leukocytes,Ua: NEGATIVE
Nitrite: NEGATIVE
RBC / HPF: NONE SEEN (ref 0–?)
Specific Gravity, Urine: >=1.03 — AB (ref 1.000–1.030)
Total Protein, Urine: NEGATIVE
Urine Glucose: 500 — AB
Urobilinogen, UA: 0.2 (ref 0.0–1.0)
pH: 5.5 (ref 5.0–8.0)

## 2019-10-30 ENCOUNTER — Telehealth: Payer: Self-pay | Admitting: Internal Medicine

## 2019-10-30 NOTE — Telephone Encounter (Signed)
This has been fixed and corrected

## 2019-10-30 NOTE — Telephone Encounter (Signed)
-----   Message from Philemon Kingdom, MD sent at 10/30/2019  2:50 PM EDT ----- Belenda Cruise, This patient has an appointment with me on July 6.  He is up on patient that requires 40 minutes.  This was specified in the appointment settings.  Please move him into a 40 min slot. Colletta [place], Can you please look into this to see how this was overriden? Thank you, both! C

## 2019-11-06 MED FILL — LEVOCETIRIZINE 5 MG TABLET: 5 | 90 days supply | Qty: 90 | Fill #0

## 2019-11-20 ENCOUNTER — Encounter: Payer: Self-pay | Admitting: Internal Medicine

## 2019-11-20 ENCOUNTER — Other Ambulatory Visit: Payer: Self-pay

## 2019-11-20 ENCOUNTER — Ambulatory Visit (INDEPENDENT_AMBULATORY_CARE_PROVIDER_SITE_OTHER): Payer: 59 | Admitting: Internal Medicine

## 2019-11-20 VITALS — BP 142/88 | HR 88 | Ht 70.0 in | Wt 250.0 lb

## 2019-11-20 DIAGNOSIS — E785 Hyperlipidemia, unspecified: Secondary | ICD-10-CM | POA: Diagnosis not present

## 2019-11-20 DIAGNOSIS — E1065 Type 1 diabetes mellitus with hyperglycemia: Secondary | ICD-10-CM | POA: Diagnosis not present

## 2019-11-20 LAB — POCT GLYCOSYLATED HEMOGLOBIN (HGB A1C): Hemoglobin A1C: 7.3 % — AB (ref 4.0–5.6)

## 2019-11-20 MED ORDER — FREESTYLE LIBRE 2 READER DEVI: 1.0000 | Freq: Every day | 0 refills | Status: DC

## 2019-11-20 MED ORDER — FREESTYLE LIBRE 2 SENSOR MISC: 1.0000 | 3 refills | Status: DC

## 2019-11-20 NOTE — Patient Instructions (Addendum)
Please use the following pump settings: - Basal rates: 12 AM: 3.3 4 AM: 3.1  6:30 AM: 3.3 8 AM: 3.0 12 PM: 3.0 4 PM: 3.2 9 PM: 3.2 10:30 PM: 3.3 - bolus:  - Insulin to carb ratio:  12 AM: 2 11 AM: 2 5 PM: 2 - Insulin sensitivity factor:  13 - target: 100-120 - Active insulin time: 3 hours  Start bolusing 15 minutes before each meal.  Please restart: - Metformin 1000 mg 2x with meals (start with 500 mg 2x a day for 4 days)  Please come back for a follow-up appointment in 3-4 months.

## 2019-11-20 NOTE — Progress Notes (Signed)
Patient ID: Zachary Graham, male   DOB: [birth year], 59 y.o.   MRN: [id number]   This visit occurred during the SARS-CoV-2 public health emergency.  Safety protocols were in place, including screening questions prior to the visit, additional usage of staff PPE, and extensive cleaning of exam room while observing appropriate contact time as indicated for disinfecting solutions.   HPI: Zachary Graham is a 59 y.o.-year-old male, presenting for f/u for DM1, dx 1993, insulin-dependent, uncontrolled, with complications (mild sensory peripheral neuropathy; hypoglycemia episodes, DR). Last visit 4 months ago.  Insulin pump: - Started in 2006 - He is on Medtronic 670 G pump >> he was sent a new pump as the old pump cracked  CGM: - Previously libre CGM (uses this as a backup) - Dexcom G6 CGM   Insulin: - Humalog   Reviewed HbA1c levels: Lab Results  Component Value Date   HGBA1C 7.4 (A) 08/02/2019   HGBA1C 7.4 (A) 04/06/2019   HGBA1C 7.6 (A) 12/08/2018   HGBA1C 8.2 (H) 10/19/2018   HGBA1C 7.7 (A) 05/02/2018   HGBA1C 7.3 (A) 01/05/2018   HGBA1C 8.1 (A) 10/03/2017   HGBA1C 7.6 03/10/2017   HGBA1C 9.7 (H) 12/02/2016   HGBA1C 7.3 04/22/2016   HGBA1C 7.8 02/17/2016   HGBA1C 8.0 11/13/2015   HGBA1C 7.8 (H) 10/14/2015   HGBA1C 8.1 05/30/2015   HGBA1C 7.6 01/30/2015   HGBA1C 7.6 (H) 11/14/2014   HGBA1C 8.2 (H) 07/17/2014   HGBA1C 8.1 (H) 11/20/2013   HGBA1C 9.0 (H) 07/25/2013   HGBA1C 8.7 (H) 02/05/2013   Pump settings: - Basal rates: 12 AM: 3.3 4 AM: 3.1  6:30 AM: 3.3 8 AM: 3.0 12 PM: 3.0 4 PM: 3.2 9:00 PM: 3.2 10:30 PM: 3.3 - bolus:  - Insulin to carb ratio:  12 AM: 2 11 AM: 2 5 PM: 2 - Insulin sensitivity factor:  13 - target: 100-120 - Active insulin time: 3 hours  TDD basal:  39% (68 units) >> 74 units (36%)  >> 35% (74.3 units) TDD bolus:  61% (170 units) >> 131 units (64%) >>  65% (136.5 units) TDD 122 units >> 210.-240 - Bolus wizard: On - Changes the pump site:  every 1-2 days  He is also on:  - Metformin 500 >> 1000 mg twice a day >> off for the last 2 weeks !!! (per [place])  We tried Trulicity in 86/7619 but he stopped in 02/2019 due to nausea and abdominal pain.  He checks his sugars more than 4 times a day with his Dexcom CGM (we will scanned reports: - Average: 158 >> 174 >> 173 +/- 52 >> 161+/-45 >> 172+/-54 - % active CGM time: 97% >> 98% >> 99.2% >> 98.3% >> 98.2% of the time - Glucose variability 31.6% >> 29.8% >> 30.3% >> 28.1% >> 31.3% (target < or = to 36%) - time in range:  - very low (<54): 0% >> 0% >> 0% >> 0.3% - low (54-69): 1% >> 0% >> 1% >> 0.1% >> 1.1% - normal range (70-180): 70% >> 58% >> 57.6% >> 72.5% >> 59.1% - high sugars (181-250): 24% >> 34% >> 41.4% >> 27.4% >> 39.8% - very high sugars (>250): 5% >> 8% >> 0% >> 5.8% >> 5% >> 8%      He has a history of a severe hypoglycemia episode in 2015.  He has a glucagon kit at home. No previous DKA  -No CKD, last BUN/creatinine:  Lab Results  Component Value Date  BUN 15 10/23/2019   CREATININE 0.81 10/23/2019  04/11/2018: 10/0.778, ACR 11.1 On Cozaar. -+ HL; last set of lipids: Lab Results  Component Value Date   CHOL 118 10/23/2019   HDL 36.90 (L) 10/23/2019   LDLCALC 57 10/23/2019   LDLDIRECT 87.0 12/02/2016   TRIG 124.0 10/23/2019   CHOLHDL 3 10/23/2019  On Lipitor. - last eye exam was in 10/2018: + Mild NPDR. [facility].  -+ Numbness and tingling in his feet.  This improved on B complex  He also has a history of GERD, HTN, OSA.  Latest TSH was normal: Lab Results  Component Value Date   TSH 2.98 10/23/2019  04/11/2018: TSH 3.02  Previous vitamin D levels: Lab Results  Component Value Date   VD25OH 59.85 10/23/2019   VD25OH 68.22 10/19/2018   VD25OH 83.45 10/03/2017   VD25OH 41.95 06/06/2015   VD25OH 35.03 07/17/2014   VD25OH 44 03/01/2013  04/11/2018: vitamin D 46.7.   He was previously on ergocalciferol.  He has adhesive  capsulitis >> improving.  He is getting occasional steroid injections but we discussed to reduce these as much as possible. On Meloxicam.  He had a colonoscopy recently >> no pathology.  ROS: Constitutional: no weight gain/no weight loss, + fatigue, no subjective hyperthermia, no subjective hypothermia Eyes: no blurry vision, no xerophthalmia ENT: no sore throat, no nodules palpated in neck, no dysphagia, no odynophagia, no hoarseness Cardiovascular: no CP/no SOB/no palpitations/no leg swelling Respiratory: no cough/no SOB/no wheezing Gastrointestinal: no N/no V/no D/no C/no acid reflux Musculoskeletal: no muscle aches/no joint aches Skin: no rashes, no hair loss Neurological: no tremors/+ numbness/+ tingling/no dizziness  I reviewed pt's medications, allergies, PMH, social hx, family hx, and changes were documented in the history of present illness. Otherwise, unchanged from my initial visit note.   Past Medical History:  Diagnosis Date  . Diabetes mellitus   . GERD (gastroesophageal reflux disease)   . Heart murmur   . Hyperlipidemia   . Hypertension   . OSA (obstructive sleep apnea)   . Pneumonia    Past Surgical History:  Procedure Laterality Date  . CERVICAL DISC ARTHROPLASTY N/A 03/16/2017   Procedure: CERVICAL FIVE- CERVICAL SIX [place] ARTHROPLASTY;  Surgeon: Jovita Gamma, MD;  Location: [facility];  Service: Neurosurgery;  Laterality: N/A;  CERVICAL 5- CERVICAL 6 DISC ARTHROPLASTY  . COLONOSCOPY    . TONSILLECTOMY     Social History   Socioeconomic History  . Marital status: Married    Spouse name: Not on file  . Number of children: 3  . Years of education: Not on file  . Highest education level: Not on file  Occupational History  . Occupation: RADIATION SAFETY OFFICER    Employer: [employer]  Tobacco Use  . Smoking status: Never Smoker  . Smokeless tobacco: Never Used  Vaping Use  . Vaping Use: Never used  Substance and Sexual Activity  . Alcohol use: No     Alcohol/week: 0.0 standard drinks  . Drug use: No  . Sexual activity: Yes    Partners: Female  Other Topics Concern  . Not on file  Social History Narrative   Regular exercise: runs 3 miles 3x week, takes one day off per week to rest   Caffeine use: stopped all diet soda on 04/23/15 per advice from Sports MD   Social Determinants of Health   Financial Resource Strain:   . Difficulty of Paying Living Expenses:   Food Insecurity:   . Worried About Estate manager/land agent  of Food in the Last Year:   . [place] in the Last Year:   Transportation Needs:   . Lack of Transportation (Medical):   Marland Kitchen Lack of Transportation (Non-Medical):   Physical Activity:   . Days of Exercise per Week:   . Minutes of Exercise per Session:   Stress:   . Feeling of Stress :   Social Connections:   . Frequency of Communication with Friends and Family:   . Frequency of Social Gatherings with Friends and Family:   . Attends Religious Services:   . Active Member of Clubs or Organizations:   . Attends Archivist Meetings:   Marland Kitchen Marital Status:   Intimate Partner Violence:   . Fear of Current or Ex-Partner:   . Emotionally Abused:   Marland Kitchen Physically Abused:   . Sexually Abused:    Current Outpatient Medications on File Prior to Visit  Medication Sig Dispense Refill  . Alpha-Lipoic Acid 600 MG CAPS Take 1 capsule by mouth daily.     Marland Kitchen amphetamine-dextroamphetamine (ADDERALL XR) 20 MG 24 hr capsule Take 1 capsule (20 mg total) by mouth every morning. 30 capsule 0  . Ascorbic Acid (VITAMIN C) 1000 MG tablet Take 1,000 mg by mouth daily.    Marland Kitchen aspirin EC 81 MG tablet Take 81 mg by mouth daily.    Marland Kitchen atorvastatin (LIPITOR) 80 MG tablet Take 80 mg by mouth daily.    Marland Kitchen azelastine (ASTELIN) 0.1 % nasal spray Place 2 sprays into both nostrils 2 (two) times daily. Use in each nostril as directed 90 mL 1  . b complex vitamins tablet Take 1 tablet by mouth daily.    Marland Kitchen BAYER MICROLET LANCETS lancets USE TO TEST BLOOD  SUGAR 6 TIMES A DAY 300 each 11  . Beclomethasone Dipropionate (QNASL) 80 MCG/ACT AERS Place 2 puffs into the nose daily as needed (allergies). 10.6 g 5  . Biotin 5000 MCG CAPS Take by mouth.    . Calcium-Magnesium-Vitamin D (CALCIUM 500 PO) Take 1 tablet by mouth daily.     Marland Kitchen glucagon (GLUCAGON EMERGENCY) 1 MG injection Inject 1 mg into the muscle once as needed. 2 each prn  . glucose blood (CONTOUR NEXT TEST) test strip USE AS INSTRUCTED TO CHECK SUGAR 6 TIMES DAILY 400 each 5  . HUMALOG 100 UNIT/ML injection FOR USE IN INSULIN PUMP, TOTAL 250 UNITS PER DAY 120 mL 11  . levocetirizine (XYZAL) 5 MG tablet Take 1 tablet (5 mg total) by mouth every evening. 90 tablet 1  . losartan (COZAAR) 50 MG tablet TAKE 1 TABLET BY MOUTH ONCE DAILY 90 tablet 1  . Magnesium Oxide 400 MG CAPS Take 1 capsule (400 mg total) by mouth daily. 90 capsule 1  . metFORMIN (GLUCOPHAGE) 500 MG tablet TAKE 2 TABLETS BY MOUTH DAILY WITH SUPPER 180 tablet 1  . Misc Natural Products (TART CHERRY ADVANCED) CAPS     . Misc Natural Products (TURMERIC CURCUMIN) CAPS Take 1 capsule by mouth daily.     . NON FORMULARY CPAP    . pantoprazole (PROTONIX) 40 MG tablet TAKE 1 TABLET BY MOUTH EVERY DAILY 90 tablet 1  . SILVADENE 1 % cream APPLY 1 APPLICATION TOPICALLY DAILY. 50 g 0  . traZODone (DESYREL) 50 MG tablet Take 1 tablet (50 mg total) by mouth at bedtime. 90 tablet 3  . Vitamin D, Ergocalciferol, (DRISDOL) 1.25 MG (50000 UNIT) CAPS capsule TAKE 1 CAPSULE BY MOUTH EVERY 7 DAYS. 12 capsule 0  No current facility-administered medications on file prior to visit.   Allergies  Allergen Reactions  . Lisinopril Swelling and Other (See Comments)    Extreme facial swelling causing hospitalization  . Shellfish Allergy Anaphylaxis  . Zetia [Ezetimibe] Other (See Comments)    ABDOMINAL CRAMPING  . Penicillins Other (See Comments)    UNSPECIFIED REACTION > Childhood allergy    . Cymbalta [Duloxetine Hcl] Other (See Comments)     Severe feet cramps   Family History  Problem Relation Age of Onset  . Colon cancer Maternal Grandfather   . Colon cancer Paternal Grandfather   . Heart disease Father        Valve replacement; pacemaker  . Alcohol abuse Neg Hx   . Asthma Neg Hx   . Diabetes Neg Hx   . Hyperlipidemia Neg Hx   . Hypertension Neg Hx   . Kidney disease Neg Hx   . Stroke Neg Hx     PE: BP (!) 142/88   Pulse 88   Ht 5' 10"  (1.778 m)   Wt 250 lb (113.4 kg)   SpO2 99%   BMI 35.87 kg/m  Body mass index is 35.87 kg/m.  Wt Readings from Last 3 Encounters:  11/20/19 250 lb (113.4 kg)  10/23/19 248 lb 4 oz (112.6 kg)  08/18/19 253 lb 15.5 oz (115.2 kg)   Constitutional: overweight, in NAD Eyes: PERRLA, EOMI, no exophthalmos ENT: moist mucous membranes, no thyromegaly, no cervical lymphadenopathy Cardiovascular: RRR, No MRG Respiratory: CTA B Gastrointestinal: abdomen soft, NT, ND, BS+ Musculoskeletal: no deformities, strength intact in all 4 Skin: moist, warm, no rashes Neurological: no tremor with outstretched hands, DTR normal in all 4  ASSESSMENT: 1. DM1, insulin-dependent, uncontrolled, without complications - DR - PN  -Insulin pump -Was on a Dexcom CGM and previously on Enlite CGM, which found less accurate >> retried this Summer 2017 >> still poor accuracy.  Currently on freestyle libre CGM.  - Barriers to good control:  Very busy schedule >> less time to check sugars and eat but But he does a great job with bolusing during the day >> still has a lot of large boluses during the day, sometimes without documented carbs or sugars  Lack of sleep >> usually sleeps max 5h a night (!) and tries to catch up in the weekend  Reaches home late at night  - sometimes at 1 am >> eats >65% of the daily calories then  2. HL  PLAN:  1. Patient with longstanding, uncontrolled, type 1 diabetes, with high insulin resistance, with improved control after adding a GLP-1 receptor agonist, however, we  had to stop this in 02/2019 due to abdominal pain and nausea. -At last visit, his sugars were still higher especially at night as he was getting most of his calories at night, and going to bed late.  His dinners were around 10 PM.  He was going to bed at 12 AM and sleeping approximately 4 hours at night.  At last visit we increased his basal rates throughout the night but we did not change the rest of the settings.  We did discuss about the potential need for U500 insulin in the near future. -He is on a Dexcom G6 CGM and he likes this.  We reviewed the CGM download at this visit and this will be scanned. -At this visit, we reviewed together his pump and CGM download (we will scanned them).  Patient tells me that he was doing very well until [facility] advised him to stop Metformin since he felt that this was not helping patient's insulin requirement.  Patient was actually taking 500 mg twice a day of Metformin instead of the recommended 1000 mg twice a day.  The [place] physician initially asked him to increase the dose to 1000 g twice a day, but his insulin requirement did not change much afterwards.  Therefore, he advised the patient to stop the Metformin completely 2 weeks ago.  After this, his sugars increase significantly and they are much more variable than before.  Also, he is now requiring much higher doses of insulin compared to before.  Therefore, at this visit, I advised the patient to let me know if another physician recommends changes in his diabetic regimen from now on, however, for now, I would recommend to restart Metformin right away.  We will start at 500 mg twice a day and then increase at 1000 mg twice a day.  For now, it is difficult to suggest other changes in his regimen especially since he tells me that he had a fructosamine level checked while he was on Metformin high-dose and this indicated an HbA1c of 6.3%, which is excellent. -I advised him to let me know if his sugars do not improve  after he starts Metformin - I advised him to:  Patient Instructions  Please use the following pump settings: - Basal rates: 12 AM: 3.3 4 AM: 3.1  6:30 AM: 3.3 8 AM: 3.0 12 PM: 3.0 4 PM: 3.2 9 PM: 3.2 10:30 PM: 3.3 - bolus:  - Insulin to carb ratio:  12 AM: 2 11 AM: 2 5 PM: 2 - Insulin sensitivity factor:  13 - target: 100-120 - Active insulin time: 3 hours  Start bolusing 15 minutes before each meal.  Please restart: - Metformin 1000 mg 2x with meals (start with 500 mg 2x a day for 4 days)  Please come back for a follow-up appointment in 3-4 months.  - we checked his HbA1c: 7.3% (slightly lower than before)  - advised to check sugars at different times of the day - 4x a day, rotating check times - advised for yearly eye exams >> he is not UTD - return to clinic in 3-4 months  2. HL -Reviewed latest lipid panel from last month: LDL at goal, HDL slightly low: Lab Results  Component Value Date   CHOL 118 10/23/2019   HDL 36.90 (L) 10/23/2019   LDLCALC 57 10/23/2019   LDLDIRECT 87.0 12/02/2016   TRIG 124.0 10/23/2019   CHOLHDL 3 10/23/2019  -He continues the statin without side effects   Philemon Kingdom, MD PhD [facility] Endocrinology

## 2019-11-21 ENCOUNTER — Telehealth: Payer: Self-pay

## 2019-11-21 DIAGNOSIS — G4733 Obstructive sleep apnea (adult) (pediatric): Secondary | ICD-10-CM | POA: Diagnosis not present

## 2019-11-21 NOTE — Telephone Encounter (Signed)
Patient insurance does not cover [employer], only Dexcom.

## 2019-11-21 NOTE — Telephone Encounter (Signed)
OK, I hope the pharmacy relayed this info to him. He already has a Dexcom, but wanted a [employer] 2 as a backup.

## 2019-11-23 MED FILL — FREESTYLE LIBRE 2 SENSOR SY: 14 days supply | Qty: 1 | Fill #0

## 2019-11-23 MED FILL — FREESTYLE LIBRE 2 READER SY: 30 days supply | Qty: 1 | Fill #0

## 2019-11-24 ENCOUNTER — Encounter: Payer: Self-pay | Admitting: Internal Medicine

## 2019-11-24 MED FILL — CONTOUR NEXT STRIPS: 66 days supply | Qty: 400 | Fill #2

## 2019-11-24 MED FILL — ADDERALL XR 20 MG CAP SA: 20 | 30 days supply | Qty: 30 | Fill #0

## 2019-12-11 ENCOUNTER — Other Ambulatory Visit: Payer: Self-pay | Admitting: Family Medicine

## 2019-12-11 MED FILL — PANTOPRAZOLE SOD DR 40 MG T: 40 | 90 days supply | Qty: 90 | Fill #1

## 2019-12-11 MED FILL — VIT D2 1.25 MG (50,000 UNIT: 1.25 MG | 84 days supply | Qty: 12 | Fill #0

## 2019-12-21 DIAGNOSIS — M7502 Adhesive capsulitis of left shoulder: Secondary | ICD-10-CM | POA: Diagnosis not present

## 2019-12-21 DIAGNOSIS — N529 Male erectile dysfunction, unspecified: Secondary | ICD-10-CM | POA: Diagnosis not present

## 2019-12-21 DIAGNOSIS — J310 Chronic rhinitis: Secondary | ICD-10-CM | POA: Diagnosis not present

## 2019-12-21 DIAGNOSIS — M5412 Radiculopathy, cervical region: Secondary | ICD-10-CM | POA: Diagnosis not present

## 2019-12-27 ENCOUNTER — Other Ambulatory Visit: Payer: Self-pay | Admitting: Internal Medicine

## 2019-12-27 DIAGNOSIS — F900 Attention-deficit hyperactivity disorder, predominantly inattentive type: Secondary | ICD-10-CM

## 2019-12-27 MED FILL — ADDERALL XR 20 MG CAP SA: 20 | 30 days supply | Qty: 30 | Fill #0

## 2019-12-28 ENCOUNTER — Encounter: Payer: Self-pay | Admitting: Internal Medicine

## 2019-12-28 DIAGNOSIS — E119 Type 2 diabetes mellitus without complications: Secondary | ICD-10-CM | POA: Diagnosis not present

## 2019-12-28 LAB — HM DIABETES EYE EXAM

## 2020-01-03 ENCOUNTER — Encounter: Payer: Self-pay | Admitting: Internal Medicine

## 2020-01-17 ENCOUNTER — Other Ambulatory Visit: Payer: Self-pay | Admitting: Internal Medicine

## 2020-01-17 DIAGNOSIS — E1065 Type 1 diabetes mellitus with hyperglycemia: Secondary | ICD-10-CM

## 2020-01-17 MED FILL — HumaLOG 100 UNIT/ML SOLN: 100 | 86 days supply | Qty: 120 | Fill #1

## 2020-01-17 MED FILL — traZODone HCL 50 MG TABS: 50 | 90 days supply | Qty: 90 | Fill #1

## 2020-01-17 MED FILL — FREESTYLE LIBRE 2 SENSOR MI: 28 days supply | Qty: 2 | Fill #0

## 2020-01-18 MED FILL — METFORMIN HCL 500 MG TABS: 500 | 90 days supply | Qty: 180 | Fill #0

## 2020-01-24 ENCOUNTER — Encounter: Payer: Self-pay | Admitting: Internal Medicine

## 2020-01-27 IMAGING — MR MR PELVIS WO/W CM
8 of 10 series · 32 of 48 positions shown · IV contrast (20ml Multihance)
Comparison: None.

CLINICAL DATA: Chronic left pelvic/groin pain, history of
prostatitis

EXAM:
MRI PELVIS WITHOUT AND WITH CONTRAST
TECHNIQUE: Multiplanar multisequence MR imaging of the pelvis was performed
both before and after administration of intravenous contrast.
CONTRAST:  20mL MULTIHANCE GADOBENATE DIMEGLUMINE 529 MG/ML IV SOLN

[Series 2: T2 · coronal · 4.5mm · 1.56mm/px · 3 of 44 slices shown (1 of 4)]
[im 1/44]
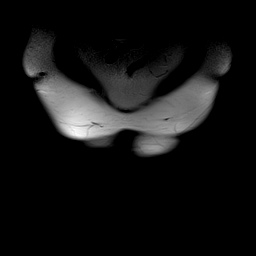
[im 22/44]
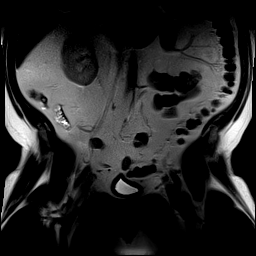
[im 44/44]
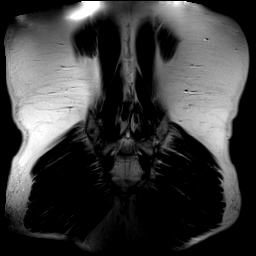

[Series 3: T2 · sagittal · 5.0mm · 0.94mm/px · 2 of 41 slices shown (2 of 4)]
[im 1/41]
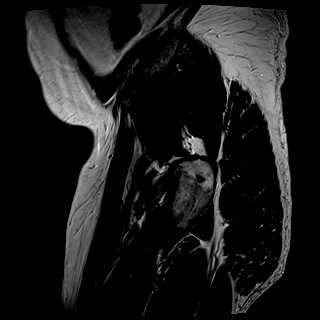
[im 41/41]
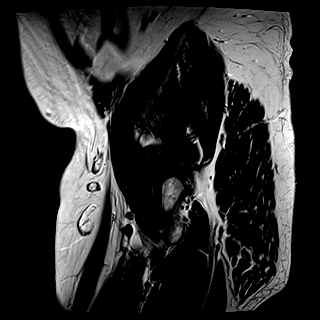

[Series 4: T2 · coronal · 5.0mm · 1.25mm/px · 2 of 36 slices shown (3 of 4)]
[im 1/36]
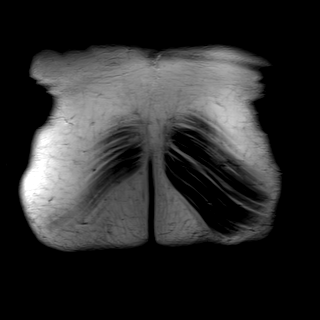
[im 36/36]
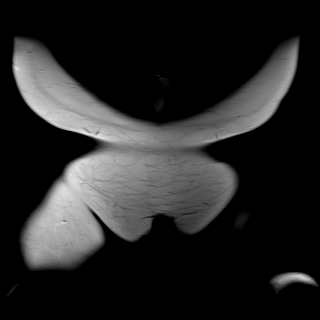

[Series 5: T2 · axial · 5.0mm · 0.41mm/px · z∈[-150,+144]mm · 3 of 50 slices shown (4 of 4)]
[im 1/50]
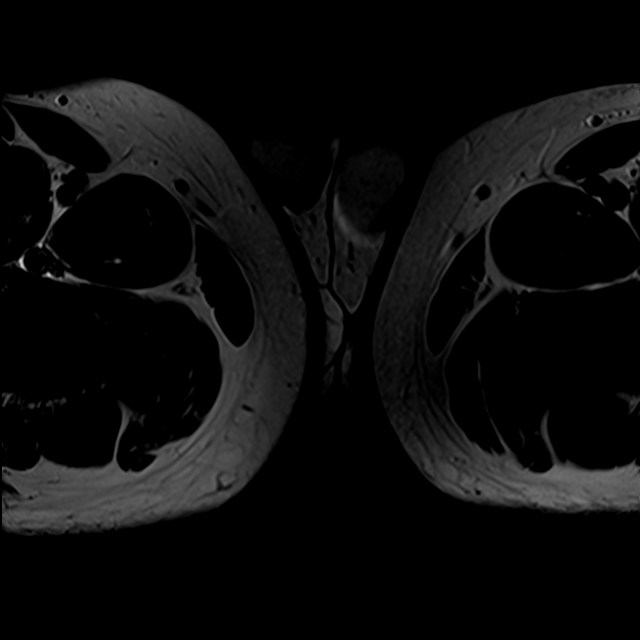
[im 25/50]
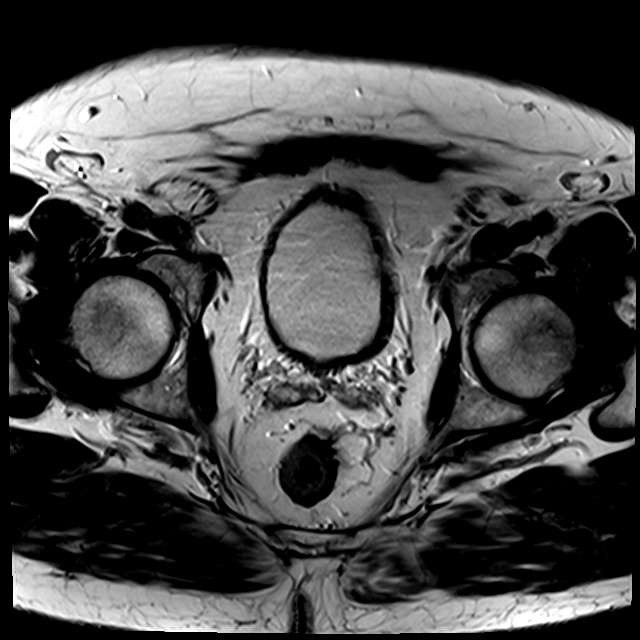
[im 50/50]
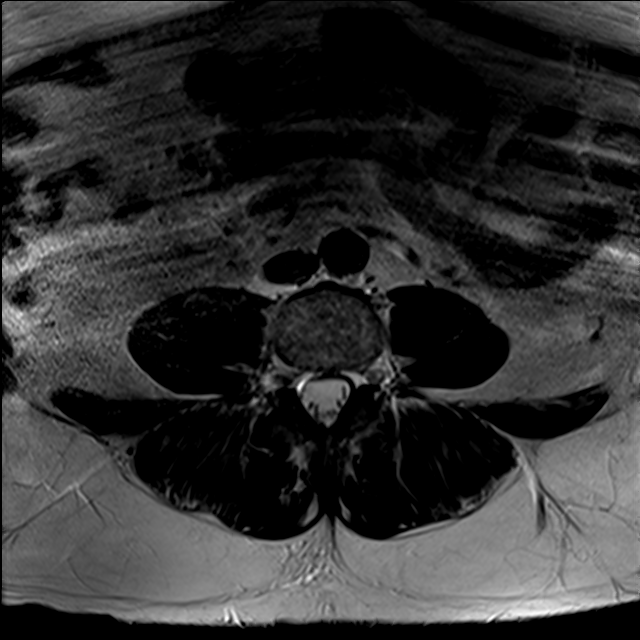

[Series 6: T2 fat-sat · axial · 5.0mm · 0.41mm/px · z∈[-150,+144]mm · 3 of 50 slices shown]
[im 1/50]
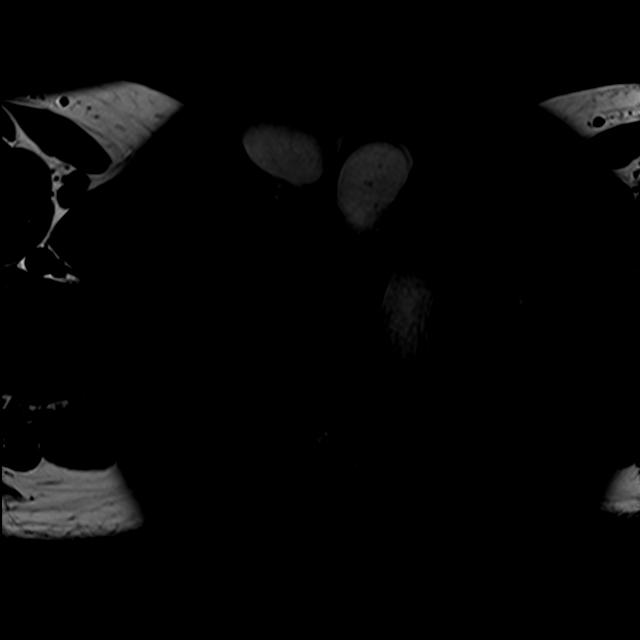
[im 25/50]
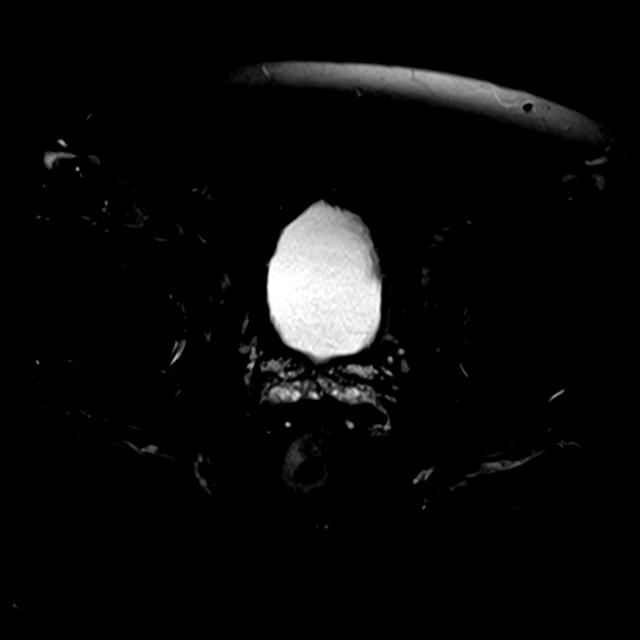
[im 50/50]
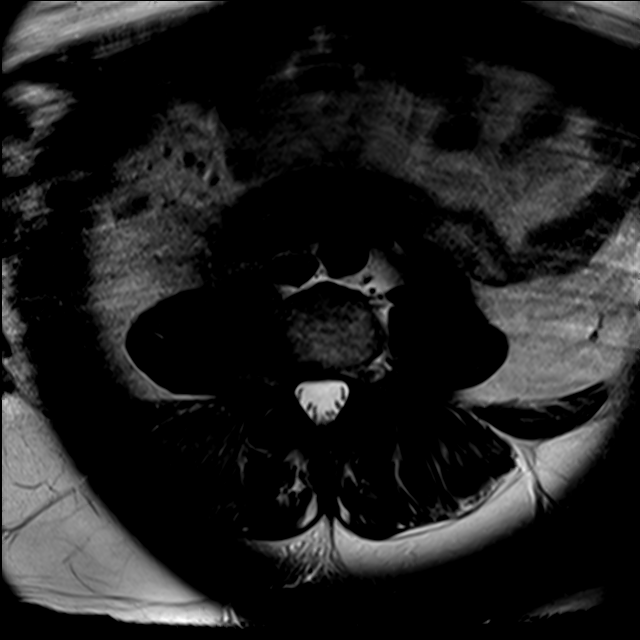

[Series 7: T1 · axial · 2.0mm · 0.75mm/px · z∈[-146,+140]mm · 7 of 144 slices shown]
[im 1/144]
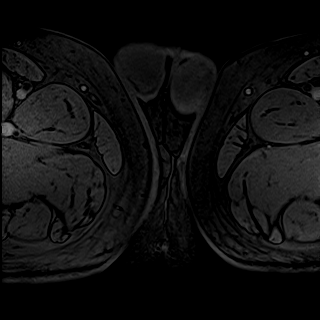
[im 24/144]
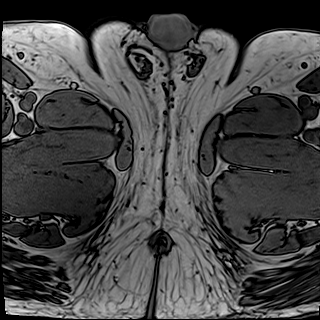
[im 48/144]
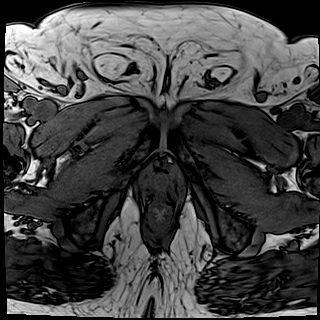
[im 72/144]
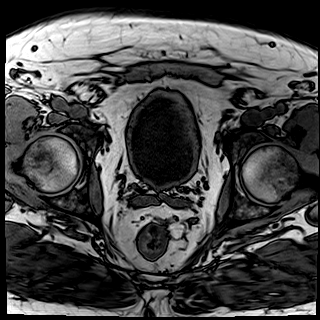
[im 96/144]
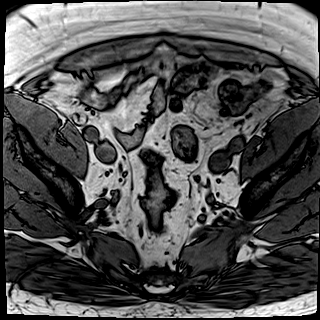
[im 120/144]
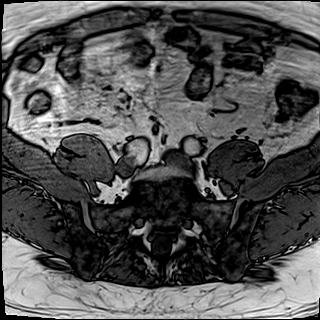
[im 144/144]
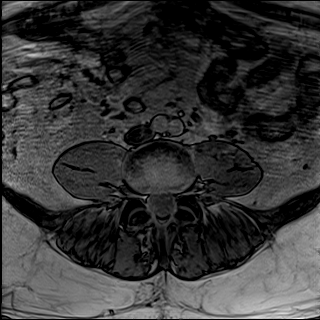

[Series 8: T1 fat-sat · axial · 2.0mm · 0.75mm/px · z∈[-146,+140]mm · 7 of 144 slices shown]
[im 1/144]
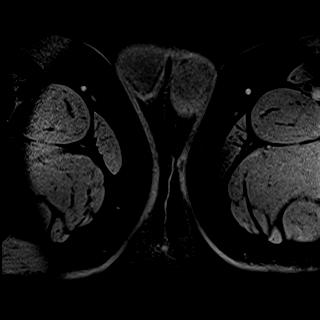
[im 24/144]
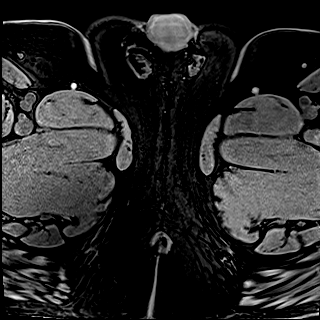
[im 48/144]
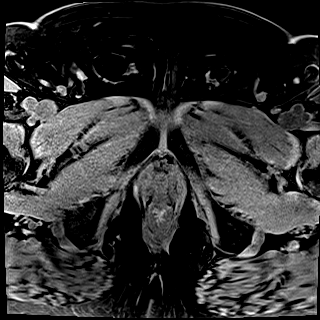
[im 72/144]
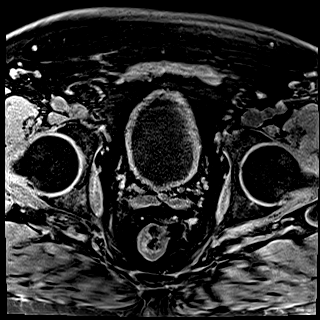
[im 96/144]
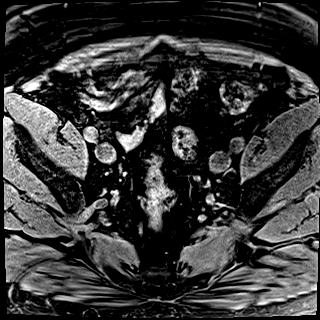
[im 120/144]
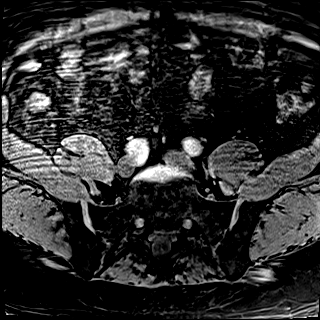
[im 144/144]
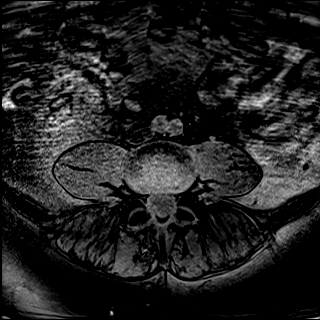

[Series 9: T1 fat-sat post-contrast · axial · 2.0mm · 0.75mm/px · z∈[-146,+44]mm · 5 of 144 slices shown]
[im 1/144]
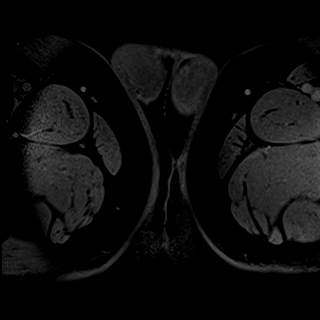
[im 24/144]
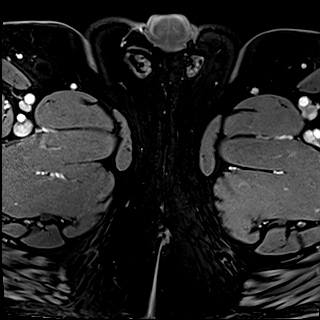
[im 48/144]
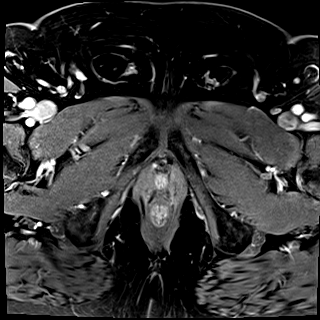
[im 72/144]
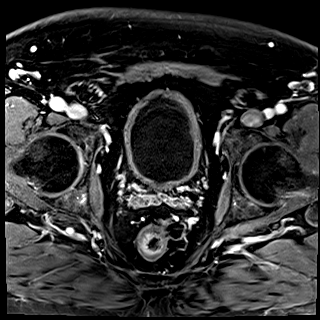
[im 96/144]
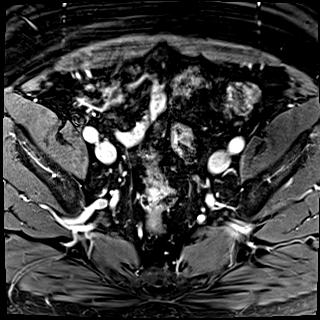

[32 of 48 positions shown; findings below may reference images not displayed]

FINDINGS: Urinary Tract:  Thick-walled bladder, although underdistended.

Bowel: Mild sigmoid diverticulosis, without evidence of
diverticulitis. Otherwise unremarkable.

Vascular/Lymphatic: No evidence of aneurysm. No suspicious pelvic
lymphadenopathy.

Reproductive: Prostate is within normal limits. No prostatic fluid
collection or abscess. No findings to suggest acute prostatitis.

Other:  No pelvic ascites.

Mild fat within the bilateral inguinal canals, right greater than
left.

Musculoskeletal: No suspicious osseous lesions.
IMPRESSION: No prostatic fluid collection or abscess. No findings to suggest
acute prostatitis.

Mild sigmoid diverticulosis, without evidence of diverticulitis.

Mild fat within the bilateral inguinal canals, right greater than
left.

## 2020-01-28 DIAGNOSIS — G4733 Obstructive sleep apnea (adult) (pediatric): Secondary | ICD-10-CM | POA: Diagnosis not present

## 2020-01-28 DIAGNOSIS — M4802 Spinal stenosis, cervical region: Secondary | ICD-10-CM | POA: Diagnosis not present

## 2020-01-28 DIAGNOSIS — M9981 Other biomechanical lesions of cervical region: Secondary | ICD-10-CM | POA: Diagnosis not present

## 2020-02-03 ENCOUNTER — Encounter: Payer: Self-pay | Admitting: Internal Medicine

## 2020-02-05 ENCOUNTER — Other Ambulatory Visit: Payer: Self-pay | Admitting: Internal Medicine

## 2020-02-05 DIAGNOSIS — F900 Attention-deficit hyperactivity disorder, predominantly inattentive type: Secondary | ICD-10-CM

## 2020-02-05 MED FILL — LEVOCETIRIZINE 5 MG TABLET: 5 | 90 days supply | Qty: 90 | Fill #1

## 2020-02-06 MED FILL — ADDERALL XR 20 MG CAP SA: 20 | 30 days supply | Qty: 30 | Fill #0

## 2020-02-18 ENCOUNTER — Encounter: Payer: Self-pay | Admitting: Internal Medicine

## 2020-02-25 DIAGNOSIS — Z23 Encounter for immunization: Secondary | ICD-10-CM | POA: Diagnosis not present

## 2020-02-29 ENCOUNTER — Ambulatory Visit: Payer: 59 | Admitting: Internal Medicine

## 2020-03-06 ENCOUNTER — Other Ambulatory Visit: Payer: Self-pay | Admitting: Family

## 2020-03-06 ENCOUNTER — Other Ambulatory Visit: Payer: Self-pay | Admitting: Family Medicine

## 2020-03-06 DIAGNOSIS — F900 Attention-deficit hyperactivity disorder, predominantly inattentive type: Secondary | ICD-10-CM

## 2020-03-06 MED FILL — VIT D2 1.25 MG (50,000 UNIT: 1.25 MG | 84 days supply | Qty: 12 | Fill #0

## 2020-03-07 ENCOUNTER — Other Ambulatory Visit: Payer: Self-pay | Admitting: Internal Medicine

## 2020-03-07 MED FILL — ADDERALL XR 20 MG CAP SA: 20 | 30 days supply | Qty: 30 | Fill #0

## 2020-03-09 ENCOUNTER — Encounter: Payer: Self-pay | Admitting: Internal Medicine

## 2020-03-10 ENCOUNTER — Other Ambulatory Visit: Payer: Self-pay | Admitting: Internal Medicine

## 2020-03-10 DIAGNOSIS — H6191 Disorder of right external ear, unspecified: Secondary | ICD-10-CM | POA: Insufficient documentation

## 2020-03-17 ENCOUNTER — Other Ambulatory Visit: Payer: Self-pay | Admitting: Internal Medicine

## 2020-03-18 ENCOUNTER — Other Ambulatory Visit: Payer: Self-pay | Admitting: Internal Medicine

## 2020-03-18 DIAGNOSIS — E1065 Type 1 diabetes mellitus with hyperglycemia: Secondary | ICD-10-CM

## 2020-03-18 DIAGNOSIS — E139 Other specified diabetes mellitus without complications: Secondary | ICD-10-CM

## 2020-03-18 MED FILL — CONTOUR NEXT STRIPS: 66 days supply | Qty: 400 | Fill #0

## 2020-03-19 ENCOUNTER — Other Ambulatory Visit: Payer: Self-pay | Admitting: Internal Medicine

## 2020-03-19 DIAGNOSIS — E139 Other specified diabetes mellitus without complications: Secondary | ICD-10-CM

## 2020-03-19 MED ORDER — LOSARTAN POTASSIUM 50 MG PO TABS: 50.0000 mg | ORAL_TABLET | Freq: Every day | ORAL | 1 refills | Status: DC

## 2020-03-19 MED FILL — LOSARTAN POTASSIUM 50 MG TA: 50 | 90 days supply | Qty: 90 | Fill #0

## 2020-03-26 ENCOUNTER — Other Ambulatory Visit: Payer: Self-pay

## 2020-03-26 ENCOUNTER — Ambulatory Visit: Payer: 59 | Admitting: Internal Medicine

## 2020-03-26 ENCOUNTER — Encounter: Payer: Self-pay | Admitting: Internal Medicine

## 2020-03-26 ENCOUNTER — Other Ambulatory Visit: Payer: Self-pay | Admitting: Internal Medicine

## 2020-03-26 VITALS — BP 130/84 | HR 96 | Ht 70.0 in | Wt 230.8 lb

## 2020-03-26 DIAGNOSIS — E1065 Type 1 diabetes mellitus with hyperglycemia: Secondary | ICD-10-CM | POA: Diagnosis not present

## 2020-03-26 DIAGNOSIS — E785 Hyperlipidemia, unspecified: Secondary | ICD-10-CM

## 2020-03-26 LAB — POCT GLYCOSYLATED HEMOGLOBIN (HGB A1C): Hemoglobin A1C: 7.2 % — AB (ref 4.0–5.6)

## 2020-03-26 MED ORDER — LYUMJEV 100 UNIT/ML IJ SOLN: INTRAMUSCULAR | 3 refills | Status: DC

## 2020-03-26 MED FILL — LYUMJEV 100 UNIT/ML SOLN: 100 | 30 days supply | Qty: 40 | Fill #0

## 2020-03-26 NOTE — Patient Instructions (Addendum)
Please use the following pump settings: - Basal rates: 12 AM: 1.20 >> 1.25 3 AM: 1.15 >> 1.20 4 AM: 1.40 6:30 AM: 1.55 8 AM: 1.90 10 AM: 1.80 12 PM: 1.55 4 PM: 1.55 9 PM: 1.65 10:30 PM: 1.30 - bolus:  - Insulin to carb ratio:  12 AM: 5 >> 4 11 AM: 5 >> 4 5 PM: 5 >> 4 - Insulin sensitivity factor:  13 >> 25 - target: 100-120 - Active insulin time: 3 hours  Start bolusing 15 minutes before each meal if you stay on Humalog, but if you start Lyumjev, you can bolus at the start of the meal.  Please continue: - Metformin 1000 mg 2x with meals  - Actos 22.5 mg daily in a.m.  Please come back for a follow-up appointment in 3-4 months.

## 2020-03-26 NOTE — Progress Notes (Signed)
Patient ID: Zachary Graham, male   DOB: Dec 04, 1960, 59 y.o.   MRN: [id number]   This visit occurred during the SARS-CoV-2 public health emergency.  Safety protocols were in place, including screening questions prior to the visit, additional usage of staff PPE, and extensive cleaning of exam room while observing appropriate contact time as indicated for disinfecting solutions.    HPI: Zachary Graham is a 59 y.o.-year-old male, presenting for f/u for DM1, dx 1993, insulin-dependent, uncontrolled, with complications (mild sensory peripheral neuropathy; hypoglycemia episodes, DR). Last visit 4 months ago.  Insulin pump: - Started in 2006 - He is on Medtronic 670 G >> he was sent a new pump as the old pump cracked  CGM: -Libre CGM as a backup -Mainly using a Dexcom G6 CGM  Insulin: -Humalog  Reviewed HbA1c levels: 10/2019: HbA1c calculated from fructosamine: 6.3% Lab Results  Component Value Date   HGBA1C 7.3 (A) 11/20/2019   HGBA1C 7.4 (A) 08/02/2019   HGBA1C 7.4 (A) 04/06/2019   HGBA1C 7.6 (A) 12/08/2018   HGBA1C 8.2 (H) 10/19/2018   HGBA1C 7.7 (A) 05/02/2018   HGBA1C 7.3 (A) 01/05/2018   HGBA1C 8.1 (A) 10/03/2017   HGBA1C 7.6 03/10/2017   HGBA1C 9.7 (H) 12/02/2016   HGBA1C 7.3 04/22/2016   HGBA1C 7.8 02/17/2016   HGBA1C 8.0 11/13/2015   HGBA1C 7.8 (H) 10/14/2015   HGBA1C 8.1 05/30/2015   HGBA1C 7.6 01/30/2015   HGBA1C 7.6 (H) 11/14/2014   HGBA1C 8.2 (H) 07/17/2014   HGBA1C 8.1 (H) 11/20/2013   HGBA1C 9.0 (H) 07/25/2013   Pump settings: - Basal rates: 12 AM: 1.20 3 AM: 1.15 4 AM: 1.40 6:30 AM: 1.55 8 AM: 1.90 10 AM: 1.80 12 PM: 1.55 4 PM: 1.55 9 PM: 1.65 10:30 PM: 1.30 - bolus:  - Insulin to carb ratio:  12 AM: ~5 11 AM: ~5 5 PM: ~5 - Insulin sensitivity factor:  13 - target: 100-120 - Active insulin time: 3 hours  TDD basal: 74 units (36%)  >> 35% (74.3 units) >> 57% TDD bolus: 131 units (64%) >>  65% (136.5 units) >> 43% TDD 122 units >> 210-240  >>  85-100 units a day - Bolus wizard: On - Changes the pump site:  every 1-2 >> now 4.5 days  He is also on:  - Metformin 500 >> 1000 mg twice a day - Actos 45 mg daily-started 12/2019 >> decreased to 22.5 mg  23/3612 We tried Trulicy in 24/4975 but he stopped in 02/2019 due to nausea and abdominal pain.  He checks his sugars more than 4 times a day with his Dexcom CGM: - Average: 161+/-45 >> 172+/-54 >> 163 +/- 48 - % active CGM time: 98.3% >> 98.2% >> 98.9% of the time - Glucose variability: 28.1% >> 31.3% >> 29.6%(target < or = to 36%) - time in range:  - very low (<54): 0% >> 0.3% >> 0% - low (54-69): 0.1% >> 1.1% >> 0.4% - normal range (70-180): 72.5% >> 59.1% >> 66% - high sugars (181-250): 27.4% >> 39.8% >> 33.6% - very high sugars (>250): 5% >> 8% >> 4.9%    Previously:   Previously:  He has a history of a severe hypoglycemia episode in 2016.  He has a glucagon kit at home. No previous DKA admissions.  -No CKD, last BUN/creatinine:  Lab Results  Component Value Date   BUN 15 10/23/2019   CREATININE 0.81 10/23/2019  04/11/2018: 10/0.778, ACR 11.1 On Cozaar. -+ HL; last set of  lipids: Lab Results  Component Value Date   CHOL 118 10/23/2019   HDL 36.90 (L) 10/23/2019   LDLCALC 57 10/23/2019   LDLDIRECT 87.0 12/02/2016   TRIG 124.0 10/23/2019   CHOLHDL 3 10/23/2019  On Lipitor high-dose. - last eye exam was on 12/28/2019: No DR, previously + Mild NPDR. [facility].  -he has Numbness and tingling in his feet.  This is improved on B complex.  He also has a history of GERD, HTN, OSA.  TSH levels are normal: Lab Results  Component Value Date   TSH 2.98 10/23/2019  04/11/2018: TSH 3.02  Vitamin D levels have been normal: Lab Results  Component Value Date   VD25OH 59.85 10/23/2019   VD25OH 68.22 10/19/2018   VD25OH 83.45 10/03/2017   VD25OH 41.95 06/06/2015   VD25OH 35.03 07/17/2014   VD25OH 44 03/01/2013  04/11/2018: vitamin D 46.7.   Previously  on ergocalciferol.  He has adhesive capsulitis >> improving.  He is getting occasional steroid injections but we discussed to reduce these as much as possible.  On meloxicam.  Colonoscopy this year >> no pathology.  ROS: Constitutional: no weight gain/+ weight loss, no fatigue, no subjective hyperthermia, no subjective hypothermia Eyes: no blurry vision, no xerophthalmia ENT: no sore throat, no nodules palpated in neck, no dysphagia, no odynophagia, no hoarseness Cardiovascular: no CP/no SOB/no palpitations/no leg swelling Respiratory: no cough/no SOB/no wheezing Gastrointestinal: no N/no V/no D/no C/no acid reflux Musculoskeletal: no muscle aches/no joint aches Skin: no rashes, no hair loss Neurological: no tremors/+ numbness/+ tingling/no dizziness  I reviewed pt's medications, allergies, PMH, social hx, family hx, and changes were documented in the history of present illness. Otherwise, unchanged from my initial visit note.   Past Medical History:  Diagnosis Date  . Diabetes mellitus   . GERD (gastroesophageal reflux disease)   . Heart murmur   . Hyperlipidemia   . Hypertension   . OSA (obstructive sleep apnea)   . Pneumonia    Past Surgical History:  Procedure Laterality Date  . CERVICAL DISC ARTHROPLASTY N/A 03/16/2017   Procedure: CERVICAL FIVE- CERVICAL SIX [facility] ARTHROPLASTY;  Surgeon: Jovita Gamma, MD;  Location: [facility];  Service: Neurosurgery;  Laterality: N/A;  CERVICAL 5- CERVICAL 6 DISC ARTHROPLASTY  . COLONOSCOPY    . TONSILLECTOMY     Social History   Socioeconomic History  . Marital status: Married    Spouse name: Not on file  . Number of children: 3  . Years of education: Not on file  . Highest education level: Not on file  Occupational History  . Occupation: RADIATION SAFETY OFFICER    Employer: [employer]  Tobacco Use  . Smoking status: Never Smoker  . Smokeless tobacco: Never Used  Vaping Use  . Vaping Use: Never used  Substance and Sexual  Activity  . Alcohol use: No    Alcohol/week: 0.0 standard drinks  . Drug use: No  . Sexual activity: Yes    Partners: Female  Other Topics Concern  . Not on file  Social History Narrative   Regular exercise: runs 3 miles 3x week, takes one day off per week to rest   Caffeine use: stopped all diet soda on 04/23/15 per advice from Sports MD   Social Determinants of Health   Financial Resource Strain:   . Difficulty of Paying Living Expenses: Not on file  Food Insecurity:   . Worried About Charity fundraiser in the Last Year: Not on file  . Ran Out  of Food in the Last Year: Not on file  Transportation Needs:   . Lack of Transportation (Medical): Not on file  . Lack of Transportation (Non-Medical): Not on file  Physical Activity:   . Days of Exercise per Week: Not on file  . Minutes of Exercise per Session: Not on file  Stress:   . Feeling of Stress : Not on file  Social Connections:   . Frequency of Communication with Friends and Family: Not on file  . Frequency of Social Gatherings with Friends and Family: Not on file  . Attends Religious Services: Not on file  . Active Member of Clubs or Organizations: Not on file  . Attends Archivist Meetings: Not on file  . Marital Status: Not on file  Intimate Partner Violence:   . Fear of Current or Ex-Partner: Not on file  . Emotionally Abused: Not on file  . Physically Abused: Not on file  . Sexually Abused: Not on file   Current Outpatient Medications on File Prior to Visit  Medication Sig Dispense Refill  . ADDERALL XR 20 MG 24 hr capsule TAKE 1 CAPSULE BY MOUTH EVERY MORNING 30 capsule 0  . Alpha-Lipoic Acid 600 MG CAPS Take 1 capsule by mouth daily.     . Ascorbic Acid (VITAMIN C) 1000 MG tablet Take 1,000 mg by mouth daily.    Marland Kitchen aspirin EC 81 MG tablet Take 81 mg by mouth daily.    Marland Kitchen atorvastatin (LIPITOR) 80 MG tablet Take 80 mg by mouth daily.    Marland Kitchen azelastine (ASTELIN) 0.1 % nasal spray Place 2 sprays into both  nostrils 2 (two) times daily. Use in each nostril as directed 90 mL 1  . b complex vitamins tablet Take 1 tablet by mouth daily.    Marland Kitchen BAYER MICROLET LANCETS lancets USE TO TEST BLOOD SUGAR 6 TIMES A DAY 300 each 11  . Beclomethasone Dipropionate (QNASL) 80 MCG/ACT AERS Place 2 puffs into the nose daily as needed (allergies). 10.6 g 5  . Biotin 5000 MCG CAPS Take by mouth.    . Calcium-Magnesium-Vitamin D (CALCIUM 500 PO) Take 1 tablet by mouth daily.     . Continuous Blood Gluc Receiver (FREESTYLE LIBRE 2 READER) DEVI 1 each by Does not apply route daily. 1 each 0  . Continuous Blood Gluc Sensor (FREESTYLE LIBRE 2 SENSOR) MISC 1 each by Does not apply route every 14 (fourteen) days. 6 each 3  . CONTOUR NEXT TEST test strip USE AS INSTRUCTED TO CHECK SUGAR 6 TIMES DAILY 400 strip 5  . glucagon (GLUCAGON EMERGENCY) 1 MG injection Inject 1 mg into the muscle once as needed. 2 each prn  . HUMALOG 100 UNIT/ML injection FOR USE IN INSULIN PUMP, TOTAL 250 UNITS PER DAY 120 mL 11  . levocetirizine (XYZAL) 5 MG tablet Take 1 tablet (5 mg total) by mouth every evening. 90 tablet 1  . losartan (COZAAR) 50 MG tablet Take 1 tablet (50 mg total) by mouth daily. 90 tablet 1  . Magnesium Oxide 400 MG CAPS Take 1 capsule (400 mg total) by mouth daily. 90 capsule 1  . metFORMIN (GLUCOPHAGE) 500 MG tablet TAKE 2 TABLETS (1,000 MG TOTAL) BY MOUTH DAILY WITH SUPPER. 180 tablet 3  . Misc Natural Products (TART CHERRY ADVANCED) CAPS     . Misc Natural Products (TURMERIC CURCUMIN) CAPS Take 1 capsule by mouth daily.     . NON FORMULARY CPAP    . pantoprazole (PROTONIX) 40 MG tablet  TAKE 1 TABLET BY MOUTH EVERY DAILY 90 tablet 1  . SILVADENE 1 % cream APPLY 1 APPLICATION TOPICALLY DAILY. 50 g 0  . traZODone (DESYREL) 50 MG tablet Take 1 tablet (50 mg total) by mouth at bedtime. 90 tablet 3  . Vitamin D, Ergocalciferol, (DRISDOL) 1.25 MG (50000 UNIT) CAPS capsule TAKE 1 CAPSULE BY MOUTH EVERY 7 DAYS 12 capsule 0  .  [DISCONTINUED] metFORMIN (GLUCOPHAGE) 500 MG tablet TAKE 2 TABLETS BY MOUTH DAILY WITH SUPPER 180 tablet 1   No current facility-administered medications on file prior to visit.   Allergies  Allergen Reactions  . Lisinopril Swelling and Other (See Comments)    Extreme facial swelling causing hospitalization  . Shellfish Allergy Anaphylaxis  . Zetia [Ezetimibe] Other (See Comments)    ABDOMINAL CRAMPING  . Penicillins Other (See Comments)    UNSPECIFIED REACTION > Childhood allergy    . Cymbalta [Duloxetine Hcl] Other (See Comments)    Severe feet cramps   Family History  Problem Relation Age of Onset  . Colon cancer Maternal Grandfather   . Colon cancer Paternal Grandfather   . Heart disease Father        Valve replacement; pacemaker  . Alcohol abuse Neg Hx   . Asthma Neg Hx   . Diabetes Neg Hx   . Hyperlipidemia Neg Hx   . Hypertension Neg Hx   . Kidney disease Neg Hx   . Stroke Neg Hx     PE: BP 130/84   Pulse 96   Ht 5' 10"  (1.778 m)   Wt 230 lb 12.8 oz (104.7 kg)   SpO2 98%   BMI 33.12 kg/m  Body mass index is 33.12 kg/m.  Wt Readings from Last 3 Encounters:  03/26/20 230 lb 12.8 oz (104.7 kg)  11/20/19 250 lb (113.4 kg)  10/23/19 248 lb 4 oz (112.6 kg)   Constitutional: overweight, in NAD Eyes: PERRLA, EOMI, no exophthalmos ENT: moist mucous membranes, no thyromegaly, no cervical lymphadenopathy Cardiovascular: tachycardia, RR, No MRG Respiratory: CTA B Gastrointestinal: abdomen soft, NT, ND, BS+ Musculoskeletal: no deformities, strength intact in all 4 Skin: moist, warm, no rashes Neurological: no tremor with outstretched hands, DTR normal in all 4  ASSESSMENT: 1. DM1, insulin-dependent, uncontrolled, without complications - DR - PN  - Barriers to good control:  Very busy schedule >> less time to check sugars and eat but But he does a great job with bolusing during the day >> still has a lot of large boluses during the day, sometimes without  documented carbs or sugars  Lack of sleep >> usually sleeps max 5h a night (!) and tries to catch up in the weekend  Reaches home late at night  - sometimes at 1 am >> eats >65% of the daily calories then  2. HL  PLAN:  1. Patient with longstanding, uncontrolled type 1 diabetes, with right insulin resistance, with improved control in the last few months after PCP added Actos 45 mg daily.  His insulin requirements decreased by 40-45% afterwards. -At last visit, sugars were higher and more variable after stopping Metformin 2 weeks prior to the visit at the indication of his [place].  We did not change his pump settings at that time but discussed about adding back Metformin. In the past, we had him on a GLP-1 receptor agonist, but he had to stop this in 02/2019 due to abdominal pain and nausea.  After he started Actos, he contacted me to let me know about  the medication we discussed about benefits and possible side effects.  He did not experience fluid retention or weight gain.  We again discussed today about the risk of bladder cancer and osteoporosis.  We decided to continue the same dose for now but after the holidays to start decreasing the dose.  At today's visit, we again reviewed possible side effects from Actos. CGM interpretation: - at today's visit, we reviewed together his CGM downloads.  66% of his values are in target range, an improvement from last visit.  However, he still has a significant amount of highs, 33.6% higher than 180 of which 4.9% higher than 250.  He has no significant lows.  Reviewing his CGM trends, it appears that his sugars are high after dinner and then decreases but stays higher than target overnight.  Sugars then improve by midday but increase again after lunch.  It is significant that the improvement in his blood sugars is evident despite him decreasing his daily insulin use by more than 50%!  The sugars remain well controlled even after dropping the Actos dose by  50%. -Based on the patterns above, we discussed about increasing his basal rates at night.  Also, since his sugars increased after meals, we will strengthen his insulin to carb ratios.  As of now, his ICR are set at 1: 2.  However, upon questioning, he is using an ICR closer to 1:5.  Therefore, will change this to 1: 4.  Since he lost a significant amount of weight and probably improved his insulin resistance significantly, I also advised him to relax his insulin sensitivity factor from 13 to25. -One of the possible reasons for postprandial hyperglycemia may be the fact that he is still not consistently bolusing 15 minutes before every meal.  Therefore, at this visit, I suggested to switch from Humalog to Lyumjev, which is injected at the start of the meal rather than 15 minutes before.  He agrees to try this.  I sent a prescription to his pharmacy. -He would like to lose another 30 pounds -in that case, we probably will need to reduce his daily insulin dosing for more. - I advised him to:  Patient Instructions  Please use the following pump settings: - Basal rates: 12 AM: 1.20 >> 1.25 3 AM: 1.15 >> 1.20 4 AM: 1.40 6:30 AM: 1.55 8 AM: 1.90 10 AM: 1.80 12 PM: 1.55 4 PM: 1.55 9 PM: 1.65 10:30 PM: 1.30 - bolus:  - Insulin to carb ratio:  12 AM: 5 >> 4 11 AM: 5 >> 4 5 PM: 5 >> 4 - Insulin sensitivity factor:  13 >> 25 - target: 100-120 - Active insulin time: 3 hours  Start bolusing 15 minutes before each meal if you stay on Humalog, but if you start Lyumjev, you can bolus at the start of the meal.  Please continue: - Metformin 1000 mg 2x with meals  - Actos 22.5 mg daily in a.m.  Please come back for a follow-up appointment in 3-4 months.  - we checked his HbA1c: 7.2% (slightly better) - advised to check sugars at different times of the day - 4x a day, rotating check times - advised for yearly eye exams >> he is UTD - return to clinic in 3-4 months  2. HL -Reviewed latest lipid  panel from 10/2019: LDL at goal, HDL slightly low, triglycerides at goal Lab Results  Component Value Date   CHOL 118 10/23/2019   HDL 36.90 (L) 10/23/2019   LDLCALC 57  10/23/2019   LDLDIRECT 87.0 12/02/2016   TRIG 124.0 10/23/2019   CHOLHDL 3 10/23/2019  -Continues Lipitor 80 without side effects   Philemon Kingdom, MD PhD [facility]

## 2020-03-30 MED FILL — METFORMIN HCL 500 MG TABS: 500 | 90 days supply | Qty: 180 | Fill #1

## 2020-04-07 ENCOUNTER — Other Ambulatory Visit: Payer: Self-pay | Admitting: Internal Medicine

## 2020-04-07 DIAGNOSIS — F900 Attention-deficit hyperactivity disorder, predominantly inattentive type: Secondary | ICD-10-CM

## 2020-04-08 ENCOUNTER — Other Ambulatory Visit: Payer: Self-pay | Admitting: Internal Medicine

## 2020-04-08 MED FILL — ADDERALL XR 20 MG CAP SA: 20 | 30 days supply | Qty: 30 | Fill #0

## 2020-04-23 ENCOUNTER — Encounter: Payer: Self-pay | Admitting: Family Medicine

## 2020-04-28 ENCOUNTER — Other Ambulatory Visit: Payer: Self-pay

## 2020-04-28 ENCOUNTER — Encounter: Payer: Self-pay | Admitting: Family Medicine

## 2020-04-28 ENCOUNTER — Encounter: Payer: Self-pay | Admitting: Internal Medicine

## 2020-04-28 ENCOUNTER — Ambulatory Visit (INDEPENDENT_AMBULATORY_CARE_PROVIDER_SITE_OTHER): Payer: 59 | Admitting: Family Medicine

## 2020-04-28 VITALS — BP 122/62 | HR 76 | Ht 70.0 in | Wt 230.0 lb

## 2020-04-28 DIAGNOSIS — M999 Biomechanical lesion, unspecified: Secondary | ICD-10-CM

## 2020-04-28 DIAGNOSIS — R293 Abnormal posture: Secondary | ICD-10-CM

## 2020-04-28 NOTE — Patient Instructions (Signed)
Exercises 3x a week Start day with standing desk Tart cherry 2400mg  at night See me in 6 weeks

## 2020-04-28 NOTE — Assessment & Plan Note (Signed)
   Decision today to treat with OMT was based on Physical Exam  After verbal consent patient was treated with HVLA in the thoracic, ME, FPR techniques in cervical, thoracic, rib, only did muscle energy in the cervical spine areas, all areas are chronic   Patient tolerated the procedure well with improvement in symptoms  Patient given exercises, stretches and lifestyle modifications  See medications in patient instructions if given  Patient will follow up in 4-8 weeks

## 2020-04-28 NOTE — Assessment & Plan Note (Signed)
Patient does have poor posture.  Does have a significant decrease in lordosis of the cervical spine.  Has had radicular symptoms and patient will give me the new MRI from the [place].  Patient does respond fairly well to osteopathic manipulation with only muscle energy of the neck and HVLA of the thoracic spine.  We discussed different ergonomic changes that patient will do including adjustable standing desk.  Patient will follow up with me again in 6 weeks

## 2020-04-28 NOTE — Progress Notes (Signed)
Zachary Graham Zachary Graham Zachary Graham Phone: [phone] Subjective:   [name], am serving as a scribe for Dr. Hulan Saas. This visit occurred during the SARS-CoV-2 public health emergency.  Safety protocols were in place, including screening questions prior to the visit, additional usage of staff PPE, and extensive cleaning of exam room while observing appropriate contact time as indicated for disinfecting solutions.   I'm seeing this patient by the request  of:  Zachary Lima, MD  CC: Low back pain  [id number]  Zachary Graham is a 59 y.o. male coming in with complaint of back and neck pain. 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 History of frozen shoulder left side. Patient had MRI of cervical spine 2 months ago. Has had EMG performed bilaterally in 2020. Normal study. Does continue to have dull tingling feeling in both hands. History of disc pathology.  Patient states that now is more of an upper back pain.  Notices it more when he is at his computer for a long amount of time.  Patient points to more in the middle of the shoulder blades bilaterally.  Patient states continues have some intermittent pain down the arms but nothing as severe as what he has had previously.           Reviewed prior external information including notes and imaging from previsou exam, outside providers and external EMR if available.   As well as notes that were available from care everywhere and other healthcare systems.  Past medical history, social, surgical and family history all reviewed in electronic medical record.  No pertanent information unless stated regarding to the chief complaint.   Past Medical History:  Diagnosis Date  . Diabetes mellitus   . GERD (gastroesophageal reflux disease)   . Heart murmur   . Hyperlipidemia   . Hypertension   . OSA (obstructive sleep apnea)    . Pneumonia     Allergies  Allergen Reactions  . Lisinopril Swelling and Other (See Comments)    Extreme facial swelling causing hospitalization  . Shellfish Allergy Anaphylaxis  . Zetia [Ezetimibe] Other (See Comments)    ABDOMINAL CRAMPING  . Penicillins Other (See Comments)    UNSPECIFIED REACTION > Childhood allergy    . Cymbalta [Duloxetine Hcl] Other (See Comments)    Severe feet cramps     Review of Systems:  No headache, visual changes, nausea, vomiting, diarrhea, constipation, dizziness, abdominal pain, skin rash, fevers, chills, night sweats, weight loss, swollen lymph nodes, body aches, joint swelling, chest pain, shortness of breath, mood changes. POSITIVE muscle aches  Objective  There were no vitals taken for this visit.   General: No apparent distress alert and oriented x3 mood and affect normal, dressed appropriately.  HEENT: Pupils equal, extraocular movements intact  Respiratory: Patient's speak in full sentences and does not appear short of breath  Cardiovascular: No lower extremity edema, non tender, no erythema  Neuro: Cranial nerves II through XII are intact, neurovascularly intact in all extremities with 2+ DTRs and 2+ pulses.  Gait normal with good balance and coordination.  MSK:  Non tender with full range of motion and good stability and symmetric strength and tone of shoulders, elbows, wrist, hip, knee and ankles bilaterally.  Upper back exam shows the patient does have tightness in the shoulders bilaterally.  Does have some scapular dyskinesis left greater than right.  Tenderness more on the left greater than the right.  Patient actually has strength of the upper extremity bilaterally with negative Tinel signs of the wrist.  Patient's neck does have some loss of lordosis and has difficulty even getting extension greater than 7 degrees.  Negative Spurling's though noted today.  Osteopathic findings  C6 flexed rotated and side bent right T3 extended  rotated and side bent left inhaled rib T9 extended rotated and side bent right L2 flexed rotated and side bent right        Assessment and Plan:    Poor posture Patient does have poor posture.  Does have a significant decrease in lordosis of the cervical spine.  Has had radicular symptoms and patient will give me the new MRI from the [place].  Patient does respond fairly well to osteopathic manipulation with only muscle energy of the neck and HVLA of the thoracic spine.  We discussed different ergonomic changes that patient will do including adjustable standing desk.  Patient will follow up with me again in 6 weeks  Nonallopathic lesion of thoracic region   Decision today to treat with OMT was based on Physical Exam  After verbal consent patient was treated with HVLA in the thoracic, ME, FPR techniques in cervical, thoracic, rib, only did muscle energy in the cervical spine areas, all areas are chronic   Patient tolerated the procedure well with improvement in symptoms  Patient given exercises, stretches and lifestyle modifications  See medications in patient instructions if given  Patient will follow up in 4-8 weeks        The above documentation has been reviewed and is accurate and complete [name], DO       Note: This dictation was prepared with Dragon dictation along with smaller phrase technology. Any transcriptional errors that result from this process are unintentional.

## 2020-04-29 ENCOUNTER — Other Ambulatory Visit: Payer: Self-pay | Admitting: Internal Medicine

## 2020-04-29 MED ORDER — "INSULIN SYRINGE-NEEDLE U-100 31G X 5/16"" 0.5 ML MISC": 5 refills | Status: DC

## 2020-04-29 MED FILL — TRUEPLUS SYR 0.5ML 31GX5/16: 31G X 5/16" | 50 days supply | Qty: 100 | Fill #0

## 2020-05-01 MED FILL — traZODone HCL 50 MG TABS: 50 | 90 days supply | Qty: 90 | Fill #2

## 2020-05-01 MED FILL — LEVOCETIRIZINE 5 MG TABLET: 5 | 90 days supply | Qty: 90 | Fill #0

## 2020-05-05 ENCOUNTER — Other Ambulatory Visit: Payer: Self-pay | Admitting: Internal Medicine

## 2020-05-05 DIAGNOSIS — F900 Attention-deficit hyperactivity disorder, predominantly inattentive type: Secondary | ICD-10-CM

## 2020-05-06 MED FILL — ADDERALL XR 20 MG CAP SA: 20 | 30 days supply | Qty: 30 | Fill #0

## 2020-05-23 DIAGNOSIS — J300 Vasomotor rhinitis: Secondary | ICD-10-CM | POA: Diagnosis not present

## 2020-05-23 DIAGNOSIS — R0982 Postnasal drip: Secondary | ICD-10-CM | POA: Diagnosis not present

## 2020-05-23 DIAGNOSIS — R059 Cough, unspecified: Secondary | ICD-10-CM | POA: Diagnosis not present

## 2020-05-23 DIAGNOSIS — Z0189 Encounter for other specified special examinations: Secondary | ICD-10-CM | POA: Diagnosis not present

## 2020-05-23 DIAGNOSIS — R490 Dysphonia: Secondary | ICD-10-CM | POA: Diagnosis not present

## 2020-05-24 ENCOUNTER — Encounter: Payer: Self-pay | Admitting: Internal Medicine

## 2020-05-24 ENCOUNTER — Other Ambulatory Visit: Payer: Self-pay | Admitting: Family Medicine

## 2020-05-24 MED FILL — CONTOUR NEXT STRIPS: 66 days supply | Qty: 400 | Fill #1

## 2020-05-24 MED FILL — LYUMJEV 100 UNIT/ML SOLN: 100 | 30 days supply | Qty: 40 | Fill #1

## 2020-05-26 ENCOUNTER — Other Ambulatory Visit: Payer: Self-pay | Admitting: Internal Medicine

## 2020-05-26 MED ORDER — PIOGLITAZONE HCL 45 MG PO TABS: 22.5000 mg | ORAL_TABLET | Freq: Every day | ORAL | 2 refills | Status: DC

## 2020-05-26 MED FILL — VIT D2 1.25 MG (50,000 UNIT: 1.25 MG | 84 days supply | Qty: 12 | Fill #0

## 2020-05-26 MED FILL — PIOGLITAZONE HCL 45 MG TABS: 45 | 90 days supply | Qty: 45 | Fill #0

## 2020-06-09 ENCOUNTER — Other Ambulatory Visit: Payer: Self-pay | Admitting: Family Medicine

## 2020-06-09 ENCOUNTER — Other Ambulatory Visit: Payer: Self-pay | Admitting: Internal Medicine

## 2020-06-09 DIAGNOSIS — F900 Attention-deficit hyperactivity disorder, predominantly inattentive type: Secondary | ICD-10-CM

## 2020-06-09 MED FILL — LOSARTAN POTASSIUM 50 MG TA: 50 | 90 days supply | Qty: 90 | Fill #1

## 2020-06-10 ENCOUNTER — Other Ambulatory Visit: Payer: Self-pay | Admitting: Internal Medicine

## 2020-06-10 MED FILL — ADDERALL XR 20 MG CAP SA: 20 | 30 days supply | Qty: 30 | Fill #0

## 2020-06-11 ENCOUNTER — Ambulatory Visit: Payer: 59 | Admitting: Family Medicine

## 2020-06-23 DIAGNOSIS — E7800 Pure hypercholesterolemia, unspecified: Secondary | ICD-10-CM | POA: Diagnosis not present

## 2020-06-23 DIAGNOSIS — E1065 Type 1 diabetes mellitus with hyperglycemia: Secondary | ICD-10-CM | POA: Diagnosis not present

## 2020-06-23 DIAGNOSIS — Z7984 Long term (current) use of oral hypoglycemic drugs: Secondary | ICD-10-CM | POA: Diagnosis not present

## 2020-06-23 MED FILL — METFORMIN HCL 500 MG TABS: 500 | 90 days supply | Qty: 180 | Fill #0

## 2020-06-23 MED FILL — LYUMJEV 100 UNIT/ML SOLN: 100 | 30 days supply | Qty: 40 | Fill #2

## 2020-06-27 DIAGNOSIS — L821 Other seborrheic keratosis: Secondary | ICD-10-CM | POA: Diagnosis not present

## 2020-06-27 DIAGNOSIS — J310 Chronic rhinitis: Secondary | ICD-10-CM | POA: Diagnosis not present

## 2020-06-27 DIAGNOSIS — R6889 Other general symptoms and signs: Secondary | ICD-10-CM | POA: Diagnosis not present

## 2020-06-27 DIAGNOSIS — M4802 Spinal stenosis, cervical region: Secondary | ICD-10-CM | POA: Diagnosis not present

## 2020-07-08 NOTE — Progress Notes (Signed)
[place] Phone: [phone] Subjective:   [name], am serving as a scribe for Dr. Hulan Saas. This visit occurred during the SARS-CoV-2 public health emergency.  Safety protocols were in place, including screening questions prior to the visit, additional usage of staff PPE, and extensive cleaning of exam room while observing appropriate contact time as indicated for disinfecting solutions.   I'm seeing this patient by the request  of:  [name], MD  CC: Low back pain follow-up, knee pain  BEE:[id number]  ZACHERIAH STUMPE is a 60 y.o. male coming in with complaint of back and neck pain. OMT 04/28/2020. Patient states that he has not noticed as much pain as last visit due to stretching and walking a bit more.   Continues to have left knee pain, medial aspect when walking downhill. Feels like knee is unstable when in this position. Does not have swelling or palpable pain. Patient states that it is irritating more than anything else. Not stopping him from activity.  Medications patient has been prescribed: Vit D          Reviewed prior external information including notes and imaging from previsou exam, outside providers and external EMR if available.   As well as notes that were available from care everywhere and other healthcare systems.  Past medical history, social, surgical and family history all reviewed in electronic medical record.  No pertanent information unless stated regarding to the chief complaint.   Past Medical History:  Diagnosis Date  . Diabetes mellitus   . GERD (gastroesophageal reflux disease)   . Heart murmur   . Hyperlipidemia   . Hypertension   . OSA (obstructive sleep apnea)   . Pneumonia     Allergies  Allergen Reactions  . Lisinopril Swelling and Other (See Comments)    Extreme facial swelling causing hospitalization  . Shellfish Allergy Anaphylaxis  . Zetia  [Ezetimibe] Other (See Comments)    ABDOMINAL CRAMPING  . Penicillins Other (See Comments)    UNSPECIFIED REACTION > Childhood allergy    . Cymbalta [Duloxetine Hcl] Other (See Comments)    Severe feet cramps     Review of Systems:  No headache, visual changes, nausea, vomiting, diarrhea, constipation, dizziness, abdominal pain, skin rash, fevers, chills, night sweats, weight loss, swollen lymph nodes, body aches, joint swelling, chest pain, shortness of breath, mood changes. POSITIVE muscle aches  Objective  Blood pressure 108/72, pulse (!) 56, height 5\' 10"  (1.778 m), weight 215 lb (97.5 kg), SpO2 98 %.   General: No apparent distress alert and oriented x3 mood and affect normal, dressed appropriately.  HEENT: Pupils equal, extraocular movements intact  Respiratory: Patient's speak in full sentences and does not appear short of breath  Cardiovascular: No lower extremity edema, non tender, no erythema  Neuro: Cranial nerves II through XII are intact, neurovascularly intact in all extremities with 2+ DTRs and 2+ pulses.  Gait normal with good balance and coordination.  MSK:  Non tender with full range of motion and good stability and symmetric strength and tone of shoulders, elbows, wrist, hip, knee and ankles bilaterally.  Back -low back exam does have some loss of lordosis. Negative radicular symptoms though. Tightness with FABER test bilaterally right greater than left.  Osteopathic findings  T3 F RS right  T9 extended rotated and side bent left L2 flexed rotated and side bent right Sacrum right on right  Assessment and Plan:  Patellofemoral stress syndrome of left knee Patient has had more of a patellofemoral stress syndrome as well as what seems to be more of a hamstring tendinopathy. Patient given home exercises. X-rays are pending. Worsening pain can consider formal physical therapy or injection and follow-up.  Lumbar pain Low back pain. Patient does have  tightness. Has responded well though to osteopathic manipulation. Encouraged him to continue to be active. Stretch hip flexors after activity. Follow-up again 6 to 8 weeks.    Nonallopathic problems  Decision today to treat with OMT was based on Physical Exam  After verbal consent patient was treated with HVLA, ME, FPR techniques in  thoracic, lumbar, and sacral  areas  Patient tolerated the procedure well with improvement in symptoms  Patient given exercises, stretches and lifestyle modifications  See medications in patient instructions if given  Patient will follow up in 4-8 weeks      The above documentation has been reviewed and is accurate and complete [name], DO       Note: This dictation was prepared with Dragon dictation along with smaller phrase technology. Any transcriptional errors that result from this process are unintentional.

## 2020-07-10 ENCOUNTER — Ambulatory Visit (INDEPENDENT_AMBULATORY_CARE_PROVIDER_SITE_OTHER): Payer: 59 | Admitting: Family Medicine

## 2020-07-10 ENCOUNTER — Ambulatory Visit (INDEPENDENT_AMBULATORY_CARE_PROVIDER_SITE_OTHER): Payer: 59

## 2020-07-10 ENCOUNTER — Other Ambulatory Visit: Payer: Self-pay

## 2020-07-10 ENCOUNTER — Encounter: Payer: Self-pay | Admitting: Family Medicine

## 2020-07-10 VITALS — BP 108/72 | HR 56 | Ht 70.0 in | Wt 215.0 lb

## 2020-07-10 DIAGNOSIS — G8929 Other chronic pain: Secondary | ICD-10-CM | POA: Diagnosis not present

## 2020-07-10 DIAGNOSIS — M999 Biomechanical lesion, unspecified: Secondary | ICD-10-CM

## 2020-07-10 DIAGNOSIS — M25562 Pain in left knee: Secondary | ICD-10-CM

## 2020-07-10 DIAGNOSIS — M545 Low back pain, unspecified: Secondary | ICD-10-CM | POA: Diagnosis not present

## 2020-07-10 DIAGNOSIS — M222X2 Patellofemoral disorders, left knee: Secondary | ICD-10-CM

## 2020-07-10 DIAGNOSIS — M549 Dorsalgia, unspecified: Secondary | ICD-10-CM | POA: Insufficient documentation

## 2020-07-10 NOTE — Assessment & Plan Note (Signed)
Low back pain. Patient does have tightness. Has responded well though to osteopathic manipulation. Encouraged him to continue to be active. Stretch hip flexors after activity. Follow-up again 6 to 8 weeks.

## 2020-07-10 NOTE — Patient Instructions (Signed)
Left knee xray today Hamstring exercises 3 times a week  Compression sleeve with working out and walking  See me again in 6-7 weeks

## 2020-07-10 NOTE — Assessment & Plan Note (Signed)
Patient has had more of a patellofemoral stress syndrome as well as what seems to be more of a hamstring tendinopathy. Patient given home exercises. X-rays are pending. Worsening pain can consider formal physical therapy or injection and follow-up.

## 2020-07-15 ENCOUNTER — Encounter: Payer: Self-pay | Admitting: Internal Medicine

## 2020-07-19 ENCOUNTER — Other Ambulatory Visit: Payer: Self-pay | Admitting: Internal Medicine

## 2020-07-19 DIAGNOSIS — F900 Attention-deficit hyperactivity disorder, predominantly inattentive type: Secondary | ICD-10-CM

## 2020-07-19 MED FILL — ADDERALL XR 20 MG CAP SA: 20 | 30 days supply | Qty: 30 | Fill #0

## 2020-07-24 ENCOUNTER — Other Ambulatory Visit: Payer: Self-pay | Admitting: Internal Medicine

## 2020-07-24 ENCOUNTER — Encounter: Payer: Self-pay | Admitting: Internal Medicine

## 2020-07-24 ENCOUNTER — Ambulatory Visit: Payer: 59 | Admitting: Internal Medicine

## 2020-07-24 ENCOUNTER — Other Ambulatory Visit: Payer: Self-pay

## 2020-07-24 VITALS — BP 120/78 | HR 53 | Ht 70.0 in | Wt 215.2 lb

## 2020-07-24 DIAGNOSIS — E1065 Type 1 diabetes mellitus with hyperglycemia: Secondary | ICD-10-CM

## 2020-07-24 DIAGNOSIS — E785 Hyperlipidemia, unspecified: Secondary | ICD-10-CM

## 2020-07-24 LAB — POCT GLYCOSYLATED HEMOGLOBIN (HGB A1C): Hemoglobin A1C: 7.2 % — AB (ref 4.0–5.6)

## 2020-07-24 MED ORDER — GLUCAGON 3 MG/DOSE NA POWD: 3.0000 mg | Freq: Once | NASAL | 11 refills | Status: DC | PRN

## 2020-07-24 MED ORDER — LYUMJEV 100 UNIT/ML IJ SOLN: INTRAMUSCULAR | 3 refills | Status: DC

## 2020-07-24 MED FILL — LYUMJEV 100 UNIT/ML SOLN: 100 | 30 days supply | Qty: 40 | Fill #0

## 2020-07-24 MED FILL — BAQSIMI ONE PACK 3 MG/DOSE: 3 | 30 days supply | Qty: 1 | Fill #0

## 2020-07-24 NOTE — Patient Instructions (Addendum)
Please use the following pump settings: - Basal rates: 12 AM: 1.10 >> 1.15 3 AM: 1.05 >> 1.10 4 AM: 1.20 6:30 AM: 1.40 8 AM: 1.80 >> 1.90 10 AM: 1.65 12 PM: 1.45 4 PM: 1.20 9 PM: 1.55 10:30 PM: 1.30  - bolus:  - Insulin to carb ratio: 1:4 - Insulin sensitivity factor:  25 - target: 100-120 - Active insulin time: 3 hours  Please continue: - Metformin 1000 mg 2x with meals  - Actos 22.5 mg daily in a.m.  [facility]: - Medtronic + Guardian - Omnipod + Dexcom G7 - t:slim + Dexcom G7  Please come back for a follow-up appointment in 3-4 months.

## 2020-07-24 NOTE — Progress Notes (Signed)
Patient ID: ASCENCION COYE, male   DOB: 1961-05-17, 60 y.o.   MRN: [id number]   This visit occurred during the SARS-CoV-2 public health emergency.  Safety protocols were in place, including screening questions prior to the visit, additional usage of staff PPE, and extensive cleaning of exam room while observing appropriate contact time as indicated for disinfecting solutions.   HPI: ADRIANE GUGLIELMO is a 60 y.o.-year-old male, presenting for f/u for DM1, dx 1993, insulin-dependent, uncontrolled, with complications (mild sensory peripheral neuropathy; hypoglycemia episodes, DR). Last visit 4 months ago.  Since last visit, after switching the insulin in his pump (from Humalog to Lyumjev), he lost 15 pounds.  Insulin pump: - Started in 2006 - He is on Medtronic 670 G pump  CGM: -Libre CGM as a backup -now using a Dexcom G6 CGM  Insulin: -Humalog >> Lyumjev now (50 vials for 3 months)  Reviewed HbA1c levels: Lab Results  Component Value Date   HGBA1C 7.2 (A) 03/26/2020   HGBA1C 7.3 (A) 11/20/2019   HGBA1C 7.4 (A) 08/02/2019   HGBA1C 7.4 (A) 04/06/2019   HGBA1C 7.6 (A) 12/08/2018   HGBA1C 8.2 (H) 10/19/2018   HGBA1C 7.7 (A) 05/02/2018   HGBA1C 7.3 (A) 01/05/2018   HGBA1C 8.1 (A) 10/03/2017   HGBA1C 7.6 03/10/2017   HGBA1C 9.7 (H) 12/02/2016   HGBA1C 7.3 04/22/2016   HGBA1C 7.8 02/17/2016   HGBA1C 8.0 11/13/2015   HGBA1C 7.8 (H) 10/14/2015   HGBA1C 8.1 05/30/2015   HGBA1C 7.6 01/30/2015   HGBA1C 7.6 (H) 11/14/2014   HGBA1C 8.2 (H) 07/17/2014   HGBA1C 8.1 (H) 11/20/2013  10/2019: HbA1c calculated from fructosamine: 6.3%  Pump settings: - Basal rates: 12 AM: 1.20 >> 1.25 >> 1.10 3 AM: 1.15 >> 1.20 >> 1.05 4 AM: 1.40 >> 1.20 6:30 AM: 1.55 >> 1.40 8 AM: 1.90 >> 1.80 10 AM: 1.80 >> 1.65 12 PM: 1.55 >> 1.45 4 PM: 1.55 >> 1.20 9 PM: 1.65 >> 1.55 10:30 PM: 1.30  - bolus:  - Insulin to carb ratio:  12 AM: 5 >> 4 11 AM: 5 >> 4 5 PM: 5 >> 4 - Insulin sensitivity factor:  13 >>  25 - target: 100-120 - Active insulin time: 3 hours  TDD basal: 74 units (36%)  >> 35% (74.3 units) >> 57% >> 40% TDD bolus: 131 units (64%) >>  65% (136.5 units) >> 43% >> 50% TDD 122 units >> 210-240  >> 85-100 units a day - Bolus wizard: On - Changes the pump site:  every 1-2 >> now 2-4 days   He is also on:  - Metformin 500 >> 1000 mg twice a day - Actos 45 mg daily-started 12/2019 >> decreased to 22.5 mg in 40/9811 We tried Trulicy in 91/4782 but he stopped in 02/2019 due to nausea and abdominal pain.  He checks his sugars more than 4 times a day with his Dexcom CGM: - Average: 161+/-45 >> 172+/-54 >> 163 +/- 48 >> 162+/-50 - % active CGM time: 98.3% >> 98.2% >> 98.9% >> 97.8% of the time - Glucose variability: 28.1% >> 31.3% >> 29.6% >> 30.7% (target < or = to 36%) - time in range:  - very low (<54): 0% >> 0.3% >> 0% >> 0.4% - low (<70): 0.1% >> 1.1% >> 0.4% >> 2.0%  - normal range (70-180): 72.5% >> 59.1% >> 66% >> 63.2% - high sugars (>180): 27.4% >> 39.8% >> 33.6% >> 34.7% - very high sugars (>250): 5% >> 8% >>  4.9% >> 4.4%   Previously:   He has a history of a severe hypoglycemic episode in 2016.  He has a glucagon kit at home (refilled by the [place]). No previous DKA admissions.  -No CKD, last BUN/creatinine:  06/23/2020: Creatinine 0.718 Lab Results  Component Value Date   BUN 15 10/23/2019   CREATININE 0.81 10/23/2019  04/11/2018: 10/0.778, ACR 11.1 On Cozaar. -+ HL; last set of lipids: 06/23/2020: 79/74/43/21 Lab Results  Component Value Date   CHOL 118 10/23/2019   HDL 36.90 (L) 10/23/2019   LDLCALC 57 10/23/2019   LDLDIRECT 87.0 12/02/2016   TRIG 124.0 10/23/2019   CHOLHDL 3 10/23/2019  On high-dose Lipitor. - last eye exam was in 12/2019: No DR, previously + Mild NPDR. [facility].  -+ Numbness and tingling in his feet.  This has improved on the B complex  He also has a history of GERD, HTN, OSA.  Latest TSH was normal: Lab Results  Component  Value Date   TSH 2.98 10/23/2019  04/11/2018: TSH 3.02  Vitamin D levels have been normal: Lab Results  Component Value Date   VD25OH 59.85 10/23/2019   VD25OH 68.22 10/19/2018   VD25OH 83.45 10/03/2017   VD25OH 41.95 06/06/2015   VD25OH 35.03 07/17/2014   VD25OH 44 03/01/2013  04/11/2018: vitamin D 46.7.   Previously on ergocalciferol.  He has adhesive capsulitis >> improving.  He is getting occasional steroid injections but we discussed to reduce these as much as possible.  On meloxicam.  Colonoscopy this year >> no pathology.  ROS: Constitutional: + weight loss, no fatigue, no subjective hyperthermia, no subjective hypothermia Eyes: no blurry vision, no xerophthalmia ENT: no sore throat, no nodules palpated in neck, no dysphagia, no odynophagia, no hoarseness but increased need to clear his voice especially towards the end of the day Cardiovascular: no CP/no SOB/no palpitations/no leg swelling Respiratory: no cough/no SOB/no wheezing Gastrointestinal: no N/no V/no D/no C/no acid reflux Musculoskeletal: no muscle aches/no joint aches Skin: no rashes, no hair loss Neurological: no tremors/+ numbness/+ tingling/no dizziness  I reviewed pt's medications, allergies, PMH, social hx, family hx, and changes were documented in the history of present illness. Otherwise, unchanged from my initial visit note.   Past Medical History:  Diagnosis Date  . Diabetes mellitus   . GERD (gastroesophageal reflux disease)   . Heart murmur   . Hyperlipidemia   . Hypertension   . OSA (obstructive sleep apnea)   . Pneumonia    Past Surgical History:  Procedure Laterality Date  . CERVICAL DISC ARTHROPLASTY N/A 03/16/2017   Procedure: CERVICAL FIVE- CERVICAL SIX [place] ARTHROPLASTY;  Surgeon: Jovita Gamma, MD;  Location: [facility];  Service: Neurosurgery;  Laterality: N/A;  CERVICAL 5- CERVICAL 6 DISC ARTHROPLASTY  . COLONOSCOPY    . TONSILLECTOMY     Social History   Socioeconomic History   . Marital status: Married    Spouse name: Not on file  . Number of children: 3  . Years of education: Not on file  . Highest education level: Not on file  Occupational History  . Occupation: RADIATION SAFETY OFFICER    Employer: [employer]  Tobacco Use  . Smoking status: Never Smoker  . Smokeless tobacco: Never Used  Vaping Use  . Vaping Use: Never used  Substance and Sexual Activity  . Alcohol use: No    Alcohol/week: 0.0 standard drinks  . Drug use: No  . Sexual activity: Yes    Partners: Female  Other Topics Concern  .  Not on file  Social History Narrative   Regular exercise: runs 3 miles 3x week, takes one day off per week to rest   Caffeine use: stopped all diet soda on 04/23/15 per advice from Sports MD   Social Determinants of Health   Financial Resource Strain: Not on file  Food Insecurity: Not on file  Transportation Needs: Not on file  Physical Activity: Not on file  Stress: Not on file  Social Connections: Not on file  Intimate Partner Violence: Not on file   Current Outpatient Medications on File Prior to Visit  Medication Sig Dispense Refill  . ADDERALL XR 20 MG 24 hr capsule TAKE 1 CAPSULE BY MOUTH EVERY MORNING 30 capsule 0  . Alpha-Lipoic Acid 600 MG CAPS Take 1 capsule by mouth daily.     . Ascorbic Acid (VITAMIN C) 1000 MG tablet Take 1,000 mg by mouth daily.    Marland Kitchen aspirin EC 81 MG tablet Take 81 mg by mouth daily.    Marland Kitchen atorvastatin (LIPITOR) 80 MG tablet Take 80 mg by mouth daily.    Marland Kitchen azelastine (ASTELIN) 0.1 % nasal spray Place 2 sprays into both nostrils 2 (two) times daily. Use in each nostril as directed 90 mL 1  . b complex vitamins tablet Take 1 tablet by mouth daily.    Marland Kitchen BAYER MICROLET LANCETS lancets USE TO TEST BLOOD SUGAR 6 TIMES A DAY 300 each 11  . Beclomethasone Dipropionate (QNASL) 80 MCG/ACT AERS Place 2 puffs into the nose daily as needed (allergies). 10.6 g 5  . Biotin 5000 MCG CAPS Take by mouth.    . Calcium-Magnesium-Vitamin D  (CALCIUM 500 PO) Take 1 tablet by mouth daily.     . Continuous Blood Gluc Receiver (FREESTYLE LIBRE 2 READER) DEVI 1 each by Does not apply route daily. 1 each 0  . Continuous Blood Gluc Sensor (FREESTYLE LIBRE 2 SENSOR) MISC 1 each by Does not apply route every 14 (fourteen) days. 6 each 3  . CONTOUR NEXT TEST test strip USE AS INSTRUCTED TO CHECK SUGAR 6 TIMES DAILY 400 strip 5  . glucagon (GLUCAGON EMERGENCY) 1 MG injection Inject 1 mg into the muscle once as needed. 2 each prn  . HUMALOG 100 UNIT/ML injection FOR USE IN INSULIN PUMP, TOTAL 250 UNITS PER DAY 120 mL 11  . Insulin Lispro-aabc (LYUMJEV) 100 UNIT/ML SOLN Use up to 120 units in the insulin pump daily 40 mL 3  . Insulin Syringe-Needle U-100 (BD INSULIN SYRINGE U/F) 31G X 5/16" 0.5 ML MISC Use 1-2x a day as needed for insulin injection 30 each 5  . levocetirizine (XYZAL) 5 MG tablet Take 1 tablet (5 mg total) by mouth every evening. 90 tablet 1  . losartan (COZAAR) 50 MG tablet Take 1 tablet (50 mg total) by mouth daily. 90 tablet 1  . Magnesium Oxide 400 MG CAPS Take 1 capsule (400 mg total) by mouth daily. 90 capsule 1  . metFORMIN (GLUCOPHAGE) 500 MG tablet TAKE 2 TABLETS (1,000 MG TOTAL) BY MOUTH DAILY WITH SUPPER. (Patient taking differently: 2,000 mg.) 180 tablet 3  . Misc Natural Products (TART CHERRY ADVANCED) CAPS     . Misc Natural Products (TURMERIC CURCUMIN) CAPS Take 1 capsule by mouth daily.     . NON FORMULARY CPAP    . pantoprazole (PROTONIX) 40 MG tablet TAKE 1 TABLET BY MOUTH EVERY DAILY 90 tablet 1  . pioglitazone (ACTOS) 45 MG tablet Take 0.5 tablets (22.5 mg total) by mouth daily.  45 tablet 2  . SILVADENE 1 % cream APPLY 1 APPLICATION TOPICALLY DAILY. 50 g 0  . traZODone (DESYREL) 50 MG tablet Take 1 tablet (50 mg total) by mouth at bedtime. 90 tablet 3  . Vitamin D, Ergocalciferol, (DRISDOL) 1.25 MG (50000 UNIT) CAPS capsule TAKE 1 CAPSULE BY MOUTH EVERY 7 DAYS 12 capsule 0   No current facility-administered  medications on file prior to visit.   Allergies  Allergen Reactions  . Lisinopril Swelling and Other (See Comments)    Extreme facial swelling causing hospitalization  . Shellfish Allergy Anaphylaxis  . Zetia [Ezetimibe] Other (See Comments)    ABDOMINAL CRAMPING  . Penicillins Other (See Comments)    UNSPECIFIED REACTION > Childhood allergy    . Cymbalta [Duloxetine Hcl] Other (See Comments)    Severe feet cramps   Family History  Problem Relation Age of Onset  . Colon cancer Maternal Grandfather   . Colon cancer Paternal Grandfather   . Heart disease Father        Valve replacement; pacemaker  . Alcohol abuse Neg Hx   . Asthma Neg Hx   . Diabetes Neg Hx   . Hyperlipidemia Neg Hx   . Hypertension Neg Hx   . Kidney disease Neg Hx   . Stroke Neg Hx     PE: BP 120/78 (BP Location: Right Arm, Patient Position: Sitting, Cuff Size: Large)   Pulse (!) 53   Ht 5' 10"  (1.778 m)   Wt 215 lb 3.2 oz (97.6 kg)   SpO2 99%   BMI 30.88 kg/m  Body mass index is 30.88 kg/m.  Wt Readings from Last 3 Encounters:  07/24/20 215 lb 3.2 oz (97.6 kg)  07/10/20 215 lb (97.5 kg)  04/28/20 230 lb (104.3 kg)   Constitutional: overweight, in NAD Eyes: PERRLA, EOMI, no exophthalmos ENT: moist mucous membranes, no thyromegaly, no cervical lymphadenopathy Cardiovascular: tachycardia, RR, No MRG Respiratory: CTA B Gastrointestinal: abdomen soft, NT, ND, BS+ Musculoskeletal: no deformities, strength intact in all 4 Skin: moist, warm, no rashes Neurological: no tremor with outstretched hands, DTR normal in all 4  ASSESSMENT: 1. DM1, insulin-dependent, uncontrolled, without complications - DR - PN  - Barriers to good control:  Very busy schedule >> less time to check sugars and eat but But he does a great job with bolusing during the day >> still has a lot of large boluses during the day, sometimes without documented carbs or sugars  Lack of sleep >> usually sleeps max 5h a night (!) and  tries to catch up in the weekend  Reaches home late at night  - sometimes at 1 am >> eats >65% of the daily calories then  2. HL  PLAN:  1. Patient with longstanding, uncontrolled type 1 diabetes, with high insulin resistance, managed on an insulin pump but with improved control after adding Actos 45 mg daily.  His insulin requirements decreased by 40 to 45% afterwards. We decreased the dose of Actos to half a tablet last year after a discussion about possible side effects, and he continues on 22.5 mg daily now.  At last visit, we switched to Lyumjev and this caused him to lose 15 pounds.  He had to decrease his insulin doses as his sugars started to drop afterwards. CGM interpretation: -At today's visit, we reviewed her CGM downloads: It appears that 63.2% of values are in target range (goal >70%), while 34.7% are higher than 180 (goal <25%), and to % are lower than 70 (  goal <4%).  The calculated average blood sugar is 162.  The projected HbA1c for the next 3 months (GMI) is 7.2%. -Reviewing the CGM trends, it appears that his sugars are usually high overnight, they increase significantly from 9 AM to approximately 11 AM and then decreased to the normal range, only to increase to a lesser extent in the evening.  Upon questioning, the increase in blood sugars in the morning is not necessarily related to his breakfast, since he noticed this even if not eating breakfast, only driving to work so it could be related to stress.  For now, I suggested to increase his basal rate from 8 AM to 10 AM and not change the insulin to carb ratios. -Also, his sugars are higher overnight possibly related to his eating most of his calories at night, but I did advise him to increase his basal rates between 12 AM and 4 AM, since this appears to be clearly consistent pattern. -We also discussed about possibly the absolute need to change his pump since this pump is also cracked (he had another one that was replaced for the  same problem before last visit).  He noticed that the cracks appeared when trying to secure the insulin reservoir in place.  We discussed about ideally being in a closed-loop system with the pump integrated with a CGM (now he has a Medtronic pump and a Dexcom CGM, which did not communicate).  I advised [facility] to [facility] see which of the available systems he can get. - I advised him to:  Patient Instructions  Please use the following pump settings: - Basal rates: 12 AM: 1.10 >> 1.15 3 AM: 1.05 >> 1.10 4 AM: 1.20 6:30 AM: 1.40 8 AM: 1.80 >> 1.90 10 AM: 1.65 12 PM: 1.45 4 PM: 1.20 9 PM: 1.55 10:30 PM: 1.30  - bolus:  - Insulin to carb ratio: 1:4 - Insulin sensitivity factor:  25 - target: 100-120 - Active insulin time: 3 hours  Please continue: - Metformin 1000 mg 2x with meals  - Actos 22.5 mg daily in a.m.  [facility] which pumps [facility]: - Medtronic + Guardian - Omnipod + Dexcom G7 - t:slim + Dexcom G7  Please come back for a follow-up appointment in 3-4 months.  - we checked his HbA1c: 7.2% (stable) - advised to check sugars at different times of the day - 4x a day, rotating check times - advised for yearly eye exams >> he is UTD - return to clinic in 3-4 months  2. HL -Reviewed latest lipid panel from 10/2019: LDL at goal, HDL slightly low: Lab Results  Component Value Date   CHOL 118 10/23/2019   HDL 36.90 (L) 10/23/2019   LDLCALC 57 10/23/2019   LDLDIRECT 87.0 12/02/2016   TRIG 124.0 10/23/2019   CHOLHDL 3 10/23/2019  -Continues Lipitor 80 without side effects   Philemon Kingdom, MD PhD [facility] Endocrinology

## 2020-08-01 DIAGNOSIS — Z91013 Allergy to seafood: Secondary | ICD-10-CM | POA: Diagnosis not present

## 2020-08-01 DIAGNOSIS — K219 Gastro-esophageal reflux disease without esophagitis: Secondary | ICD-10-CM | POA: Diagnosis not present

## 2020-08-01 DIAGNOSIS — J300 Vasomotor rhinitis: Secondary | ICD-10-CM | POA: Diagnosis not present

## 2020-08-05 MED FILL — LEVOCETIRIZINE 5 MG TABLET: 5 | 90 days supply | Qty: 90 | Fill #1

## 2020-08-05 MED FILL — traZODone HCL 50 MG TABS: 50 | 90 days supply | Qty: 90 | Fill #3

## 2020-08-18 ENCOUNTER — Other Ambulatory Visit (HOSPITAL_COMMUNITY): Payer: Self-pay

## 2020-08-18 ENCOUNTER — Other Ambulatory Visit: Payer: Self-pay | Admitting: Internal Medicine

## 2020-08-18 ENCOUNTER — Other Ambulatory Visit: Payer: Self-pay | Admitting: Family Medicine

## 2020-08-18 DIAGNOSIS — F900 Attention-deficit hyperactivity disorder, predominantly inattentive type: Secondary | ICD-10-CM

## 2020-08-18 MED ORDER — VITAMIN D (ERGOCALCIFEROL) 1.25 MG (50000 UNIT) PO CAPS
ORAL_CAPSULE | ORAL | 0 refills | Status: DC
  Filled 2020-08-18: qty 12, 84d supply, fill #0

## 2020-08-18 MED ORDER — AMPHETAMINE-DEXTROAMPHET ER 20 MG PO CP24
ORAL_CAPSULE | ORAL | 0 refills | Status: DC
  Filled 2020-08-18: qty 30, 30d supply, fill #0

## 2020-08-20 NOTE — Progress Notes (Signed)
Entered in error

## 2020-08-21 ENCOUNTER — Ambulatory Visit: Payer: 59 | Admitting: Family Medicine

## 2020-08-21 ENCOUNTER — Encounter (HOSPITAL_COMMUNITY): Payer: Self-pay | Admitting: Emergency Medicine

## 2020-08-21 ENCOUNTER — Ambulatory Visit (HOSPITAL_COMMUNITY)
Admission: EM | Admit: 2020-08-21 | Discharge: 2020-08-21 | Disposition: A | Discharge status: Home / Self Care | Payer: 59 | Attending: Urgent Care | Admitting: Urgent Care

## 2020-08-21 ENCOUNTER — Other Ambulatory Visit: Payer: Self-pay

## 2020-08-21 ENCOUNTER — Encounter: Payer: Self-pay | Admitting: Family Medicine

## 2020-08-21 ENCOUNTER — Other Ambulatory Visit (HOSPITAL_COMMUNITY): Payer: Self-pay

## 2020-08-21 DIAGNOSIS — A084 Viral intestinal infection, unspecified: Secondary | ICD-10-CM

## 2020-08-21 DIAGNOSIS — R112 Nausea with vomiting, unspecified: Secondary | ICD-10-CM

## 2020-08-21 MED ORDER — LOPERAMIDE HCL 2 MG PO CAPS
2.0000 mg | ORAL_CAPSULE | Freq: Two times a day (BID) | ORAL | 0 refills | Status: DC | PRN
  Filled 2020-08-21: qty 14, 7d supply, fill #0

## 2020-08-21 MED ORDER — ONDANSETRON 8 MG PO TBDP
8.0000 mg | ORAL_TABLET | Freq: Three times a day (TID) | ORAL | 0 refills | Status: DC | PRN
  Filled 2020-08-21: qty 20, 7d supply, fill #0

## 2020-08-21 NOTE — Discharge Instructions (Signed)

## 2020-08-21 NOTE — ED Provider Notes (Signed)
[place]   MRN: [id number] DOB: 10-08-1960  Subjective:   Zachary Graham is a 60 y.o. male presenting for 2-day history of acute onset nausea, vomiting, diarrhea.  Patient states that yesterday had significant amount of diarrhea that has improved some today but still feels unsettled in the stomach.  He did take Imodium to help with this.  He had 2-3 violent episodes of vomiting yesterday and still feel nauseous but no vomiting today.  Denies fever, chest pain, shortness of breath, abdominal pain, bloody stools, hematemesis.  Has eaten light meals today.  No current facility-administered medications for this encounter.  Current Outpatient Medications:  .  Alpha-Lipoic Acid 600 MG CAPS, Take 1 capsule by mouth daily. , Disp: , Rfl:  .  amphetamine-dextroamphetamine (ADDERALL XR) 20 MG 24 hr capsule, TAKE 1 CAPSULE BY MOUTH ONCE DAILY IN THE MORNING. DUE 07/19/20., Disp: 30 capsule, Rfl: 0 .  Ascorbic Acid (VITAMIN C) 1000 MG tablet, Take 1,000 mg by mouth daily., Disp: , Rfl:  .  aspirin EC 81 MG tablet, Take 81 mg by mouth daily., Disp: , Rfl:  .  atorvastatin (LIPITOR) 80 MG tablet, Take 80 mg by mouth daily., Disp: , Rfl:  .  azelastine (ASTELIN) 0.1 % nasal spray, Place 2 sprays into both nostrils 2 (two) times daily. Use in each nostril as directed, Disp: 90 mL, Rfl: 1 .  b complex vitamins tablet, Take 1 tablet by mouth daily., Disp: , Rfl:  .  BAYER MICROLET LANCETS lancets, USE TO TEST BLOOD SUGAR 6 TIMES A DAY, Disp: 300 each, Rfl: 11 .  Beclomethasone Dipropionate (QNASL) 80 MCG/ACT AERS, Place 2 puffs into the nose daily as needed (allergies)., Disp: 10.6 g, Rfl: 5 .  Biotin 5000 MCG CAPS, Take by mouth., Disp: , Rfl:  .  Calcium-Magnesium-Vitamin D (CALCIUM 500 PO), Take 1 tablet by mouth daily. , Disp: , Rfl:  .  Continuous Blood Gluc Receiver (FREESTYLE LIBRE 2 READER) DEVI, 1 each by Does not apply route daily., Disp: 1 each, Rfl: 0 .  Continuous Blood Gluc  Sensor (FREESTYLE LIBRE 2 SENSOR) MISC, 1 each by Does not apply route every 14 (fourteen) days., Disp: 6 each, Rfl: 3 .  Glucagon 3 MG/DOSE POWD, PLACE 3 MG INTO THE NOSE ONCE AS NEEDED FOR UP TO 1 DOSE., Disp: 1 each, Rfl: 11 .  glucose blood test strip, USE AS INSTRUCTED TO CHECK SUGAR 6 TIMES DAILY, Disp: 400 strip, Rfl: 5 .  Insulin Lispro-aabc 100 UNIT/ML SOLN, USE UP TO 120 UNITS IN THE INSULIN PUMP DAILY, Disp: 50 mL, Rfl: 3 .  Insulin Syringe-Needle U-100 31G X 5/16" 0.5 ML MISC, USE 1 - 2 TIMES DAILY AS NEEDED FOR INSULIN INJECTION, Disp: 30 each, Rfl: 5 .  levocetirizine (XYZAL) 5 MG tablet, TAKE 1 TABLET BY MOUTH IN THE EVENING, Disp: 90 tablet, Rfl: 1 .  losartan (COZAAR) 50 MG tablet, TAKE 1 TABLET BY MOUTH DAILY., Disp: 90 tablet, Rfl: 1 .  Magnesium Oxide 400 MG CAPS, Take 1 capsule (400 mg total) by mouth daily., Disp: 90 capsule, Rfl: 1 .  metFORMIN (GLUCOPHAGE) 500 MG tablet, TAKE 2 TABLETS BY MOUTH DAILY WITH SUPPER. (Patient taking differently: 2,000 mg.), Disp: 180 tablet, Rfl: 3 .  Misc Natural Products (TART CHERRY ADVANCED) CAPS, , Disp: , Rfl:  .  Misc Natural Products (TURMERIC CURCUMIN) CAPS, Take 1 capsule by mouth daily. , Disp: , Rfl:  .  NON FORMULARY, CPAP, Disp: ,  Rfl:  .  pantoprazole (PROTONIX) 40 MG tablet, TAKE 1 TABLET BY MOUTH EVERY DAILY, Disp: 90 tablet, Rfl: 1 .  pioglitazone (ACTOS) 45 MG tablet, TAKE 1/2 TABLET BY MOUTH ONCE DAILY, Disp: 45 tablet, Rfl: 2 .  SILVADENE 1 % cream, APPLY 1 APPLICATION TOPICALLY DAILY., Disp: 50 g, Rfl: 0 .  traZODone (DESYREL) 50 MG tablet, TAKE 1 TABLET BY MOUTH AT BEDTIME, Disp: 90 tablet, Rfl: 3 .  Vitamin D, Ergocalciferol, (DRISDOL) 1.25 MG (50000 UNIT) CAPS capsule, TAKE 1 CAPSULE BY MOUTH EVERY 7 DAYS, Disp: 12 capsule, Rfl: 0   Allergies  Allergen Reactions  . Lisinopril Swelling and Other (See Comments)    Extreme facial swelling causing hospitalization  . Shellfish Allergy Anaphylaxis  . Zetia [Ezetimibe]  Other (See Comments)    ABDOMINAL CRAMPING  . Penicillins Other (See Comments)    UNSPECIFIED REACTION > Childhood allergy    . Cymbalta [Duloxetine Hcl] Other (See Comments)    Severe feet cramps    Past Medical History:  Diagnosis Date  . Diabetes mellitus   . GERD (gastroesophageal reflux disease)   . Heart murmur   . Hyperlipidemia   . Hypertension   . OSA (obstructive sleep apnea)   . Pneumonia      Past Surgical History:  Procedure Laterality Date  . CERVICAL DISC ARTHROPLASTY N/A 03/16/2017   Procedure: CERVICAL FIVE- CERVICAL SIX [place] ARTHROPLASTY;  Surgeon: Jovita Gamma, MD;  Location: [facility];  Service: Neurosurgery;  Laterality: N/A;  CERVICAL 5- CERVICAL 6 DISC ARTHROPLASTY  . COLONOSCOPY    . TONSILLECTOMY      Family History  Problem Relation Age of Onset  . Colon cancer Maternal Grandfather   . Colon cancer Paternal Grandfather   . Heart disease Father        Valve replacement; pacemaker  . Alcohol abuse Neg Hx   . Asthma Neg Hx   . Diabetes Neg Hx   . Hyperlipidemia Neg Hx   . Hypertension Neg Hx   . Kidney disease Neg Hx   . Stroke Neg Hx     Social History   Tobacco Use  . Smoking status: Never Smoker  . Smokeless tobacco: Never Used  Vaping Use  . Vaping Use: Never used  Substance Use Topics  . Alcohol use: No    Alcohol/week: 0.0 standard drinks  . Drug use: No    ROS   Objective:   Vitals: BP (!) 110/59 (BP Location: Right Arm)   Pulse 81   Temp 98.7 F (37.1 C) (Oral)   Resp 17   SpO2 100%   Physical Exam Constitutional:      General: He is not in acute distress.    Appearance: Normal appearance. He is well-developed. He is not ill-appearing, toxic-appearing or diaphoretic.  HENT:     Head: Normocephalic and atraumatic.     Right Ear: External ear normal.     Left Ear: External ear normal.     Nose: Nose normal.     Mouth/Throat:     Mouth: Mucous membranes are moist.     Pharynx: Oropharynx is clear. No  oropharyngeal exudate or posterior oropharyngeal erythema.  Eyes:     General: No scleral icterus.    Extraocular Movements: Extraocular movements intact.     Pupils: Pupils are equal, round, and reactive to light.  Cardiovascular:     Rate and Rhythm: Normal rate and regular rhythm.     Heart sounds: Normal heart sounds. No murmur  heard. No friction rub. No gallop.   Pulmonary:     Effort: Pulmonary effort is normal. No respiratory distress.     Breath sounds: Normal breath sounds. No stridor. No wheezing, rhonchi or rales.  Abdominal:     General: There is no distension.     Palpations: Abdomen is soft. There is no mass.     Tenderness: There is no abdominal tenderness. There is no guarding or rebound.     Comments: Hyperactive bowel sounds.  Skin:    General: Skin is warm and dry.  Neurological:     Mental Status: He is alert and oriented to person, place, and time.  Psychiatric:        Mood and Affect: Mood normal.        Behavior: Behavior normal.        Thought Content: Thought content normal.       Assessment and Plan :   PDMP not reviewed this encounter.  1. Viral gastroenteritis   2. Nausea vomiting and diarrhea     Will manage for suspected viral gastroenteritis with supportive care.  Recommended patient hydrate well, eat light meals and maintain electrolytes.  Will use Zofran and Imodium for nausea, vomiting and diarrhea. Counseled patient on potential for adverse effects with medications prescribed/recommended today, ER and return-to-clinic precautions discussed, patient verbalized understanding.    Jaynee Eagles, [place] 08/21/20 1623

## 2020-08-21 NOTE — ED Triage Notes (Signed)
Pt presents with N,V,D, and fever that started yesterday morning.

## 2020-08-26 ENCOUNTER — Encounter: Payer: Self-pay | Admitting: Internal Medicine

## 2020-08-26 ENCOUNTER — Other Ambulatory Visit (HOSPITAL_COMMUNITY): Payer: Self-pay

## 2020-08-26 DIAGNOSIS — E1065 Type 1 diabetes mellitus with hyperglycemia: Secondary | ICD-10-CM

## 2020-08-26 MED FILL — Metformin HCl Tab 500 MG: ORAL | 90 days supply | Qty: 180 | Fill #0 | Status: CN

## 2020-08-27 MED ORDER — METFORMIN HCL 500 MG PO TABS
1000.0000 mg | ORAL_TABLET | Freq: Two times a day (BID) | ORAL | 1 refills | Status: DC
  Filled 2020-08-27: qty 360, 90d supply, fill #0
  Filled 2020-10-24 – 2020-12-03 (×2): qty 360, 90d supply, fill #1

## 2020-08-28 ENCOUNTER — Other Ambulatory Visit (HOSPITAL_COMMUNITY): Payer: Self-pay

## 2020-09-09 NOTE — Progress Notes (Signed)
[facility] Phone: [phone] Subjective:   [name], am serving as a scribe for Dr. Hulan Saas. This visit occurred during the SARS-CoV-2 public health emergency.  Safety protocols were in place, including screening questions prior to the visit, additional usage of staff PPE, and extensive cleaning of exam room while observing appropriate contact time as indicated for disinfecting solutions.   I'm seeing this patient by the request  of:  [name], MD  CC: Low back pain follow-up, left knee pain follow-up  [facility]:ENIDPOEUMP  BLANCHARD WILLHITE is a 60 y.o. male coming in with complaint of back and neck pain. OM T2/24/2022. Had to cancel last visit in April due to illness. Patient states that his back pain is the same as last visit. Is walking 13k steps a day. Does have L knee pain over lateral aspect with doing down steps and occasionally feels like knee is going to give out.   Medications patient has been prescribed: None  Taking:         Reviewed prior external information including notes and imaging from previsou exam, outside providers and external EMR if available.   As well as notes that were available from care everywhere and other healthcare systems.  Past medical history, social, surgical and family history all reviewed in electronic medical record.  No pertanent information unless stated regarding to the chief complaint.   Past Medical History:  Diagnosis Date  . Diabetes mellitus   . GERD (gastroesophageal reflux disease)   . Heart murmur   . Hyperlipidemia   . Hypertension   . OSA (obstructive sleep apnea)   . Pneumonia     Allergies  Allergen Reactions  . Lisinopril Swelling and Other (See Comments)    Extreme facial swelling causing hospitalization  . Shellfish Allergy Anaphylaxis  . Zetia [Ezetimibe] Other (See Comments)    ABDOMINAL CRAMPING  . Penicillins Other (See  Comments)    UNSPECIFIED REACTION > Childhood allergy    . Cymbalta [Duloxetine Hcl] Other (See Comments)    Severe feet cramps     Review of Systems:  No headache, visual changes, nausea, vomiting, diarrhea, constipation, dizziness, abdominal pain, skin rash, fevers, chills, night sweats, weight loss, swollen lymph nodes, body aches, joint swelling, chest pain, shortness of breath, mood changes. POSITIVE muscle aches  Objective  Blood pressure 112/72, pulse 86, height 5\' 10"  (1.778 m), weight 214 lb (97.1 kg), SpO2 97 %.   General: No apparent distress alert and oriented x3 mood and affect normal, dressed appropriately.  HEENT: Pupils equal, extraocular movements intact  Respiratory: Patient's speak in full sentences and does not appear short of breath  Cardiovascular: No lower extremity edema, non tender, no erythema  Gait normal with good balance and coordination.  MSK:  Non tender with full range of motion and good stability and symmetric strength and tone of shoulders, elbows, wrist, hip, and ankles bilaterally.  Patient does have some loss of lordosis of the lumbar spine.  Patient is tender to palpation diffusely.  Patient does have tightness of the sacroiliac joint bilaterally.  Seems to be right greater than left.  Left knee exam shows the patient does have some lateral tracking of the patella noted.  Mild instability of the patella seems to be noted.  Lacks last 5 degrees of flexion.  Negative straight McMurray's.  Good stability with valgus and varus force at the moment   Osteopathic findings  T5 extended rotated and side bent left L1 flexed rotated and side bent right L5 flexed rotated and side bent left Sacrum right on right       Assessment and Plan:  Patellofemoral stress syndrome of left knee Patient given a Tru pull lite, encouraged home exercises, discussed potential formal physical therapy.  Worsening pain consider injections or advanced imaging.  Follow-up  again in 6 weeks  Lumbar pain Chronic problem, patient is still is having pain even though he has lost a significant amount of weight.  Patient wants to continue to work on this.  Discussed with patient about diet changes, low impact exercises.  Icing regimen.  We discussed worsening symptoms such as radicular symptoms to come sooner follow-up with me again in 6 weeks    Nonallopathic problems  Decision today to treat with OMT was based on Physical Exam  After verbal consent patient was treated with HVLA, ME, FPR techniques inthoracic, lumbar, and sacral  areas  Patient tolerated the procedure well with improvement in symptoms  Patient given exercises, stretches and lifestyle modifications  See medications in patient instructions if given  Patient will follow up in 4-8 weeks      The above documentation has been reviewed and is accurate and complete [name], DO       Note: This dictation was prepared with Dragon dictation along with smaller phrase technology. Any transcriptional errors that result from this process are unintentional.

## 2020-09-10 ENCOUNTER — Encounter: Payer: Self-pay | Admitting: Family Medicine

## 2020-09-10 ENCOUNTER — Ambulatory Visit (INDEPENDENT_AMBULATORY_CARE_PROVIDER_SITE_OTHER): Payer: 59 | Admitting: Family Medicine

## 2020-09-10 ENCOUNTER — Other Ambulatory Visit: Payer: Self-pay

## 2020-09-10 VITALS — BP 112/72 | HR 86 | Ht 70.0 in | Wt 214.0 lb

## 2020-09-10 DIAGNOSIS — M9902 Segmental and somatic dysfunction of thoracic region: Secondary | ICD-10-CM

## 2020-09-10 DIAGNOSIS — M9904 Segmental and somatic dysfunction of sacral region: Secondary | ICD-10-CM

## 2020-09-10 DIAGNOSIS — M25562 Pain in left knee: Secondary | ICD-10-CM | POA: Diagnosis not present

## 2020-09-10 DIAGNOSIS — M222X2 Patellofemoral disorders, left knee: Secondary | ICD-10-CM

## 2020-09-10 DIAGNOSIS — M9903 Segmental and somatic dysfunction of lumbar region: Secondary | ICD-10-CM

## 2020-09-10 DIAGNOSIS — M545 Low back pain, unspecified: Secondary | ICD-10-CM | POA: Diagnosis not present

## 2020-09-10 NOTE — Assessment & Plan Note (Signed)
Patient given a Tru pull lite, encouraged home exercises, discussed potential formal physical therapy.  Worsening pain consider injections or advanced imaging.  Follow-up again in 6 weeks

## 2020-09-10 NOTE — Assessment & Plan Note (Signed)
Chronic problem, patient is still is having pain even though he has lost a significant amount of weight.  Patient wants to continue to work on this.  Discussed with patient about diet changes, low impact exercises.  Icing regimen.  We discussed worsening symptoms such as radicular symptoms to come sooner follow-up with me again in 6 weeks

## 2020-09-15 ENCOUNTER — Other Ambulatory Visit (HOSPITAL_COMMUNITY): Payer: Self-pay

## 2020-09-15 ENCOUNTER — Other Ambulatory Visit: Payer: Self-pay | Admitting: Internal Medicine

## 2020-09-15 DIAGNOSIS — F900 Attention-deficit hyperactivity disorder, predominantly inattentive type: Secondary | ICD-10-CM

## 2020-09-15 DIAGNOSIS — Z1152 Encounter for screening for COVID-19: Secondary | ICD-10-CM | POA: Diagnosis not present

## 2020-09-16 ENCOUNTER — Other Ambulatory Visit (HOSPITAL_COMMUNITY): Payer: Self-pay

## 2020-09-16 MED ORDER — AMPHETAMINE-DEXTROAMPHET ER 20 MG PO CP24
ORAL_CAPSULE | ORAL | 0 refills | Status: DC
  Filled 2020-09-16: qty 30, 30d supply, fill #0

## 2020-09-18 DIAGNOSIS — R1310 Dysphagia, unspecified: Secondary | ICD-10-CM | POA: Diagnosis not present

## 2020-09-25 ENCOUNTER — Other Ambulatory Visit (HOSPITAL_COMMUNITY): Payer: Self-pay

## 2020-09-25 MED FILL — Glucose Blood Test Strip: 66 days supply | Qty: 400 | Fill #0 | Status: CN

## 2020-09-30 ENCOUNTER — Other Ambulatory Visit (HOSPITAL_COMMUNITY): Payer: Self-pay

## 2020-10-01 ENCOUNTER — Encounter: Payer: Self-pay | Admitting: Internal Medicine

## 2020-10-07 ENCOUNTER — Other Ambulatory Visit (HOSPITAL_COMMUNITY): Payer: Self-pay

## 2020-10-07 MED ORDER — CARESTART COVID-19 HOME TEST VI KIT
PACK | 0 refills | Status: DC
  Filled 2020-10-07: qty 4, 4d supply, fill #0

## 2020-10-07 MED FILL — Glucose Blood Test Strip: 66 days supply | Qty: 400 | Fill #0 | Status: CN

## 2020-10-08 ENCOUNTER — Other Ambulatory Visit (HOSPITAL_COMMUNITY): Payer: Self-pay

## 2020-10-20 ENCOUNTER — Other Ambulatory Visit: Payer: Self-pay | Admitting: Internal Medicine

## 2020-10-20 ENCOUNTER — Other Ambulatory Visit (HOSPITAL_COMMUNITY): Payer: Self-pay

## 2020-10-20 DIAGNOSIS — F900 Attention-deficit hyperactivity disorder, predominantly inattentive type: Secondary | ICD-10-CM

## 2020-10-20 DIAGNOSIS — E139 Other specified diabetes mellitus without complications: Secondary | ICD-10-CM

## 2020-10-21 NOTE — Progress Notes (Signed)
  [place] [address] [place] Phone: [phone] Subjective:   I Zachary Graham am serving as a Education administrator for Dr. Hulan Saas.  This visit occurred during the SARS-CoV-2 public health emergency.  Safety protocols were in place, including screening questions prior to the visit, additional usage of staff PPE, and extensive cleaning of exam room while observing appropriate contact time as indicated for disinfecting solutions.   I'm seeing this patient by the request  of:  Zachary Lima, MD  CC: back and neck pain   W[facility]:ENIDPOEUMP  Zachary Graham is a 60 y.o. male coming in with complaint of back and neck pain Patient states he is doing well and does not have any new issues.  Patient has been feeling much better at this time.  He states that the knee is feeling good.  Has not had any difficulties.  Wearing the brace with walking with the dog.  No trouble at this time.  Wants to start increasing activity again.          Reviewed prior external information including notes and imaging from previsou exam, outside providers and external EMR if available.   As well as notes that were available from care everywhere and other healthcare systems.  Past medical history, social, surgical and family history all reviewed in electronic medical record.  No pertanent information unless stated regarding to the chief complaint.   Past Medical History:  Diagnosis Date  . Diabetes mellitus   . GERD (gastroesophageal reflux disease)   . Heart murmur   . Hyperlipidemia   . Hypertension   . OSA (obstructive sleep apnea)   . Pneumonia     Allergies  Allergen Reactions  . Lisinopril Swelling and Other (See Comments)    Extreme facial swelling causing hospitalization  . Shellfish Allergy Anaphylaxis  . Zetia [Ezetimibe] Other (See Comments)    ABDOMINAL CRAMPING  . Penicillins Other (See Comments)    UNSPECIFIED REACTION > Childhood allergy    .  Cymbalta [Duloxetine Hcl] Other (See Comments)    Severe feet cramps     Review of Systems:  No headache, visual changes, nausea, vomiting, diarrhea, constipation, dizziness, abdominal pain, skin rash, fevers, chills, night sweats, weight loss, swollen lymph nodes, body aches, joint swelling, chest pain, shortness of breath, mood changes. POSITIVE muscle aches  Objective  Blood pressure 130/82, pulse 82, height 5\' 10"  (1.778 m), weight 217 lb (98.4 kg), SpO2 98 %.   General: No apparent distress alert and oriented x3 mood and affect normal, dressed appropriately.  HEENT: Pupils equal, extraocular movements intact  Respiratory: Patient's speak in full sentences and does not appear short of breath  Cardiovascular: No lower extremity edema, non tender, no erythema  Gait normal with good balance and coordination.  MSK: left knee exam shows mild OA of the knee. Lateral tracking of patella, no tenderness on exam         Assessment and Plan:        The above documentation has been reviewed and is accurate and complete [name], DO       Note: This dictation was prepared with Dragon dictation along with smaller phrase technology. Any transcriptional errors that result from this process are unintentional.

## 2020-10-22 ENCOUNTER — Other Ambulatory Visit: Payer: Self-pay

## 2020-10-22 ENCOUNTER — Ambulatory Visit (INDEPENDENT_AMBULATORY_CARE_PROVIDER_SITE_OTHER): Payer: 59 | Admitting: Family Medicine

## 2020-10-22 ENCOUNTER — Encounter: Payer: Self-pay | Admitting: Family Medicine

## 2020-10-22 DIAGNOSIS — M222X2 Patellofemoral disorders, left knee: Secondary | ICD-10-CM

## 2020-10-22 NOTE — Patient Instructions (Addendum)
Good to see you Continue the brace with exercise for another 6 weeks Ok to increase activity DHEA 50 mg daily for 4 weeks May help with the day time sleepiness but read about it  See me again in 6 weeks

## 2020-10-22 NOTE — Assessment & Plan Note (Signed)
Patient is doing significantly better at this time.  Discussed icing regimen and home exercises.  Patient can wear the brace with next 6 weeks.  Discussed with patient to continue to increase activity as tolerated.  Follow-up with me again in 6 weeks

## 2020-10-24 ENCOUNTER — Encounter: Payer: Self-pay | Admitting: Internal Medicine

## 2020-10-24 ENCOUNTER — Other Ambulatory Visit: Payer: Self-pay | Admitting: Internal Medicine

## 2020-10-24 ENCOUNTER — Other Ambulatory Visit (HOSPITAL_COMMUNITY): Payer: Self-pay

## 2020-10-24 DIAGNOSIS — F900 Attention-deficit hyperactivity disorder, predominantly inattentive type: Secondary | ICD-10-CM

## 2020-10-24 DIAGNOSIS — F320 Major depressive disorder, single episode, mild: Secondary | ICD-10-CM

## 2020-10-24 DIAGNOSIS — E139 Other specified diabetes mellitus without complications: Secondary | ICD-10-CM

## 2020-10-24 MED FILL — Glucose Blood Test Strip: 60 days supply | Qty: 400 | Fill #0 | Status: CN

## 2020-10-25 ENCOUNTER — Other Ambulatory Visit (HOSPITAL_COMMUNITY): Payer: Self-pay

## 2020-10-27 ENCOUNTER — Other Ambulatory Visit (HOSPITAL_COMMUNITY): Payer: Self-pay

## 2020-10-27 ENCOUNTER — Other Ambulatory Visit: Payer: Self-pay

## 2020-10-28 ENCOUNTER — Ambulatory Visit: Payer: 59 | Admitting: Internal Medicine

## 2020-10-28 ENCOUNTER — Encounter: Payer: Self-pay | Admitting: Internal Medicine

## 2020-10-28 VITALS — BP 126/68 | HR 77 | Temp 98.6°F | Resp 16 | Ht 70.0 in | Wt 217.0 lb

## 2020-10-28 DIAGNOSIS — E785 Hyperlipidemia, unspecified: Secondary | ICD-10-CM | POA: Diagnosis not present

## 2020-10-28 DIAGNOSIS — E139 Other specified diabetes mellitus without complications: Secondary | ICD-10-CM

## 2020-10-28 DIAGNOSIS — I493 Ventricular premature depolarization: Secondary | ICD-10-CM | POA: Diagnosis not present

## 2020-10-28 DIAGNOSIS — E1065 Type 1 diabetes mellitus with hyperglycemia: Secondary | ICD-10-CM | POA: Diagnosis not present

## 2020-10-28 DIAGNOSIS — Z0000 Encounter for general adult medical examination without abnormal findings: Secondary | ICD-10-CM

## 2020-10-28 DIAGNOSIS — I370 Nonrheumatic pulmonary valve stenosis: Secondary | ICD-10-CM

## 2020-10-28 DIAGNOSIS — I10 Essential (primary) hypertension: Secondary | ICD-10-CM | POA: Diagnosis not present

## 2020-10-28 DIAGNOSIS — E559 Vitamin D deficiency, unspecified: Secondary | ICD-10-CM | POA: Diagnosis not present

## 2020-10-28 DIAGNOSIS — F320 Major depressive disorder, single episode, mild: Secondary | ICD-10-CM

## 2020-10-28 DIAGNOSIS — F900 Attention-deficit hyperactivity disorder, predominantly inattentive type: Secondary | ICD-10-CM | POA: Diagnosis not present

## 2020-10-28 LAB — BASIC METABOLIC PANEL
BUN: 9 mg/dL (ref 6–23)
CO2: 26 mEq/L (ref 19–32)
Calcium: 9.4 mg/dL (ref 8.4–10.5)
Chloride: 101 mEq/L (ref 96–112)
Creatinine, Ser: 0.75 mg/dL (ref 0.40–1.50)
GFR: 100.14 mL/min (ref >60.00)
Glucose, Bld: 139 mg/dL — ABNORMAL HIGH (ref 70–99)
Potassium: 4.3 mEq/L (ref 3.5–5.1)
Sodium: 137 mEq/L (ref 135–145)

## 2020-10-28 LAB — URINALYSIS, ROUTINE W REFLEX MICROSCOPIC
Bilirubin Urine: NEGATIVE
Hgb urine dipstick: NEGATIVE
Leukocytes,Ua: NEGATIVE
Nitrite: NEGATIVE
RBC / HPF: NONE SEEN (ref 0–?)
Specific Gravity, Urine: 1.025 (ref 1.000–1.030)
Total Protein, Urine: NEGATIVE
Urine Glucose: 100 — AB
Urobilinogen, UA: 0.2 (ref 0.0–1.0)
WBC, UA: NONE SEEN (ref 0–?)
pH: 6 (ref 5.0–8.0)

## 2020-10-28 LAB — CBC WITH DIFFERENTIAL/PLATELET
Basophils Absolute: 0.1 10*3/uL (ref 0.0–0.1)
Basophils Relative: 1.2 % (ref 0.0–3.0)
Eosinophils Absolute: 0.3 10*3/uL (ref 0.0–0.7)
Eosinophils Relative: 4.7 % (ref 0.0–5.0)
HCT: 39.3 % (ref 39.0–52.0)
Hemoglobin: 13 g/dL (ref 13.0–17.0)
Lymphocytes Relative: 30.3 % (ref 12.0–46.0)
Lymphs Abs: 2 10*3/uL (ref 0.7–4.0)
MCHC: 33.1 g/dL (ref 30.0–36.0)
MCV: 83.5 fl (ref 78.0–100.0)
Monocytes Absolute: 0.5 10*3/uL (ref 0.1–1.0)
Monocytes Relative: 7.4 % (ref 3.0–12.0)
Neutro Abs: 3.6 10*3/uL (ref 1.4–7.7)
Neutrophils Relative %: 56.4 % (ref 43.0–77.0)
Platelets: 180 10*3/uL (ref 150.0–400.0)
RBC: 4.71 Mil/uL (ref 4.22–5.81)
RDW: 15.6 % — ABNORMAL HIGH (ref 11.5–15.5)
WBC: 6.4 10*3/uL (ref 4.0–10.5)

## 2020-10-28 LAB — HEPATIC FUNCTION PANEL
ALT: 19 U/L (ref 0–53)
AST: 14 U/L (ref 0–37)
Albumin: 4.2 g/dL (ref 3.5–5.2)
Alkaline Phosphatase: 90 U/L (ref 39–117)
Bilirubin, Direct: 0.2 mg/dL (ref 0.0–0.3)
Total Bilirubin: 0.7 mg/dL (ref 0.2–1.2)
Total Protein: 6.4 g/dL (ref 6.0–8.3)

## 2020-10-28 LAB — LIPID PANEL
Cholesterol: 109 mg/dL (ref 0–200)
HDL: 42.6 mg/dL (ref >39.00)
LDL Cholesterol: 45 mg/dL (ref 0–99)
NonHDL: 66.31
Total CHOL/HDL Ratio: 3
Triglycerides: 109 mg/dL (ref 0.0–149.0)
VLDL: 21.8 mg/dL (ref 0.0–40.0)

## 2020-10-28 LAB — MICROALBUMIN / CREATININE URINE RATIO
Creatinine,U: 131.3 mg/dL
Microalb Creat Ratio: 0.7 mg/g (ref 0.0–30.0)
Microalb, Ur: 0.9 mg/dL (ref 0.0–1.9)

## 2020-10-28 LAB — TSH: TSH: 3.11 u[IU]/mL (ref 0.35–4.50)

## 2020-10-28 LAB — PSA: PSA: 0.19 ng/mL (ref 0.10–4.00)

## 2020-10-28 LAB — VITAMIN D 25 HYDROXY (VIT D DEFICIENCY, FRACTURES): VITD: 74.87 ng/mL (ref 30.00–100.00)

## 2020-10-28 LAB — HEMOGLOBIN A1C: Hgb A1c MFr Bld: 8.3 % — ABNORMAL HIGH (ref 4.6–6.5)

## 2020-10-28 NOTE — Progress Notes (Signed)
Subjective:  Patient ID: Zachary Graham, male    DOB: 1960-12-23  Age: 60 y.o. MRN: [id number]  CC: Annual Exam, Diabetes, and Hyperlipidemia  This visit occurred during the SARS-CoV-2 public health emergency.  Safety protocols were in place, including screening questions prior to the visit, additional usage of staff PPE, and extensive cleaning of exam room while observing appropriate contact time as indicated for disinfecting solutions.    HPI JABE JEANBAPTISTE presents for a CPX and f/up -   Pt states ADD status overall stable on current meds with overall good compliance and tolerability, and good effectiveness with respect to ability for concentration and task completion.   He has intentionally lost about 40 pounds with lifestyle modifications.  He exercises and denies any recent episodes of chest pain, shortness of breath, diaphoresis, dizziness, lightheadedness, edema, or near syncope.  He has decreased his insulin dose from 330 units a day to 150 units a day.  He denies polys.  Outpatient Medications Prior to Visit  Medication Sig Dispense Refill  . Alpha-Lipoic Acid 600 MG CAPS Take 1 capsule by mouth daily.     . Ascorbic Acid (VITAMIN C) 1000 MG tablet Take 1,000 mg by mouth daily.    Marland Kitchen azelastine (ASTELIN) 0.1 % nasal spray Place 2 sprays into both nostrils 2 (two) times daily. Use in each nostril as directed 90 mL 1  . b complex vitamins tablet Take 1 tablet by mouth daily.    Marland Kitchen BAYER MICROLET LANCETS lancets USE TO TEST BLOOD SUGAR 6 TIMES A DAY 300 each 11  . Beclomethasone Dipropionate (QNASL) 80 MCG/ACT AERS Place 2 puffs into the nose daily as needed (allergies). 10.6 g 5  . Calcium-Magnesium-Vitamin D (CALCIUM 500 PO) Take 1 tablet by mouth daily.     . Continuous Blood Gluc Receiver (FREESTYLE LIBRE 2 READER) DEVI 1 each by Does not apply route daily. 1 each 0  . Continuous Blood Gluc Sensor (FREESTYLE LIBRE 2 SENSOR) MISC 1 each by Does not apply route every 14 (fourteen) days.  6 each 3  . famotidine (PEPCID) 40 MG tablet Take 1 tablet by mouth at bedtime.    . Glucagon 3 MG/DOSE POWD PLACE 3 MG INTO THE NOSE ONCE AS NEEDED FOR UP TO 1 DOSE. 1 each 11  . glucose blood test strip USE AS INSTRUCTED TO CHECK SUGAR 6 TIMES DAILY (Patient taking differently: USE AS INSTRUCTED TO CHECK SUGAR 6 TIMES DAILY) 400 strip 5  . Insulin Lispro-aabc 100 UNIT/ML SOLN USE UP TO 120 UNITS IN THE INSULIN PUMP DAILY 50 mL 3  . Insulin Syringe-Needle U-100 31G X 5/16" 0.5 ML MISC USE 1 - 2 TIMES DAILY AS NEEDED FOR INSULIN INJECTION 30 each 5  . Magnesium Oxide 400 MG CAPS Take 1 capsule (400 mg total) by mouth daily. 90 capsule 1  . metFORMIN (GLUCOPHAGE) 500 MG tablet Take 2 tablets (1,000 mg total) by mouth 2 (two) times daily before a meal. 360 tablet 1  . NON FORMULARY CPAP    . omeprazole (PRILOSEC) 40 MG capsule TAKE ONE CAPSULE BY MOUTH TWICE A DAY BEFORE MEALS (TAKE ON AN EMPTY STOMACH 30 MINUTES PRIOR TO A MEAL)    . pioglitazone (ACTOS) 45 MG tablet TAKE 1/2 TABLET BY MOUTH ONCE DAILY 45 tablet 2  . Vitamin D, Ergocalciferol, (DRISDOL) 1.25 MG (50000 UNIT) CAPS capsule TAKE 1 CAPSULE BY MOUTH EVERY 7 DAYS 12 capsule 0  . amphetamine-dextroamphetamine (ADDERALL XR) 20 MG 24 hr  capsule TAKE 1 CAPSULE BY MOUTH ONCE DAILY IN THE MORNING. DUE 07/19/20. 30 capsule 0  . aspirin EC 81 MG tablet Take 81 mg by mouth daily.    Marland Kitchen atorvastatin (LIPITOR) 80 MG tablet Take 80 mg by mouth daily.    . Biotin 5000 MCG CAPS Take by mouth.    . loperamide (IMODIUM) 2 MG capsule Take 1 capsule (2 mg total) by mouth 2 (two) times daily as needed for diarrhea or loose stools. 14 capsule 0  . losartan (COZAAR) 50 MG tablet TAKE 1 TABLET BY MOUTH DAILY. 90 tablet 1  . Misc Natural Products (TART CHERRY ADVANCED) CAPS     . Misc Natural Products (TURMERIC CURCUMIN) CAPS Take 1 capsule by mouth daily.     . ondansetron (ZOFRAN-ODT) 8 MG disintegrating tablet dissolve 1 tablet in mouth every 8 hours as  needed for nausea or vomiting. 20 tablet 0  . SILVADENE 1 % cream APPLY 1 APPLICATION TOPICALLY DAILY. 50 g 0  . levocetirizine (XYZAL) 5 MG tablet TAKE 1 TABLET BY MOUTH IN THE EVENING 90 tablet 1  . COVID-19 At Home Antigen Test (CARESTART COVID-19 HOME TEST) KIT Use as directed (Patient taking differently: Use as directed) 4 each 0  . pantoprazole (PROTONIX) 40 MG tablet TAKE 1 TABLET BY MOUTH EVERY DAILY 90 tablet 1  . traZODone (DESYREL) 50 MG tablet TAKE 1 TABLET BY MOUTH AT BEDTIME 90 tablet 3   No facility-administered medications prior to visit.    ROS Review of Systems  Constitutional:  Negative for diaphoresis, fatigue and unexpected weight change.  HENT: Negative.    Eyes: Negative.   Respiratory:  Negative for cough, chest tightness, shortness of breath and wheezing.   Cardiovascular:  Negative for chest pain, palpitations and leg swelling.  Gastrointestinal:  Negative for abdominal pain, constipation, diarrhea, nausea and vomiting.  Endocrine: Negative.  Negative for polydipsia, polyphagia and polyuria.  Genitourinary: Negative.  Negative for difficulty urinating, scrotal swelling, testicular pain and urgency.  Musculoskeletal:  Negative for arthralgias and myalgias.  Skin: Negative.   Neurological:  Negative for dizziness, weakness, light-headedness and headaches.  Hematological:  Negative for adenopathy. Does not bruise/bleed easily.  Psychiatric/Behavioral:  Positive for dysphoric mood and sleep disturbance. Negative for confusion, decreased concentration and suicidal ideas. The patient is nervous/anxious.    Objective:  BP 126/68 (BP Location: Right Arm, Patient Position: Sitting, Cuff Size: Large)   Pulse 77   Temp 98.6 F (37 C) (Oral)   Resp 16   Ht 5' 10"  (1.778 m)   Wt 217 lb (98.4 kg)   SpO2 97%   BMI 31.14 kg/m   BP Readings from Last 3 Encounters:  10/28/20 126/68  10/22/20 130/82  09/10/20 112/72    Wt Readings from Last 3 Encounters:  10/28/20  217 lb (98.4 kg)  10/22/20 217 lb (98.4 kg)  09/10/20 214 lb (97.1 kg)    Physical Exam Vitals reviewed.  Constitutional:      Appearance: Normal appearance.  HENT:     Nose: Nose normal.     Mouth/Throat:     Mouth: Mucous membranes are moist.  Eyes:     Conjunctiva/sclera: Conjunctivae normal.  Cardiovascular:     Rate and Rhythm: Normal rate and regular rhythm. FrequentExtrasystoles are present.    Heart sounds: Murmur heard.  Systolic murmur is present with a grade of 1/6.  No diastolic murmur is present.    No gallop.     Comments: EKG-  SR  with 1st degree AV block and frequent PVCs No LVH or Q waves No changes Pulmonary:     Effort: Pulmonary effort is normal.     Breath sounds: Normal breath sounds. No stridor. No wheezing, rhonchi or rales.  Abdominal:     General: Abdomen is flat. Bowel sounds are normal. There is no distension.     Palpations: Abdomen is soft. There is no hepatomegaly, splenomegaly or mass.     Tenderness: There is no abdominal tenderness.     Hernia: There is no hernia in the left inguinal area or right inguinal area.  Genitourinary:    Pubic Area: No rash.      Penis: Normal and circumcised.      Testes: Normal.     Epididymis:     Right: Normal.     Left: Normal.     Prostate: Normal. Not enlarged, not tender and no nodules present.     Rectum: Normal. Guaiac result negative. No mass, tenderness, anal fissure, external hemorrhoid or internal hemorrhoid. Normal anal tone.  Musculoskeletal:     Cervical back: Neck supple.     Right lower leg: No edema.     Left lower leg: No edema.  Lymphadenopathy:     Cervical: No cervical adenopathy.     Lower Body: No right inguinal adenopathy. No left inguinal adenopathy.  Skin:    General: Skin is warm and dry.  Neurological:     General: No focal deficit present.     Mental Status: He is alert.  Psychiatric:        Mood and Affect: Mood normal.        Behavior: Behavior normal.    Lab  Results  Component Value Date   WBC 6.4 10/28/2020   HGB 13.0 10/28/2020   HCT 39.3 10/28/2020   PLT 180.0 10/28/2020   GLUCOSE 139 (H) 10/28/2020   CHOL 109 10/28/2020   TRIG 109.0 10/28/2020   HDL 42.60 10/28/2020   LDLDIRECT 87.0 12/02/2016   LDLCALC 45 10/28/2020   ALT 19 10/28/2020   AST 14 10/28/2020   NA 137 10/28/2020   K 4.3 10/28/2020   CL 101 10/28/2020   CREATININE 0.75 10/28/2020   BUN 9 10/28/2020   CO2 26 10/28/2020   TSH 2.95 10/28/2020   TSH 3.11 10/28/2020   PSA 0.19 10/28/2020   HGBA1C 8.3 (H) 10/28/2020   MICROALBUR 0.9 10/28/2020    No results found.  Assessment & Plan:   Gurfateh was seen today for annual exam, diabetes and hyperlipidemia.  Diagnoses and all orders for this visit:  Type 1 diabetes mellitus with hyperglycemia, with long-term current use of insulin ([place])- His A1c is up to 8.3%.  He will adjust his basal insulin dosage. -     Basic metabolic panel; Future -     Microalbumin / creatinine urine ratio; Future -     Hemoglobin A1c; Future -     Urinalysis, Routine w reflex microscopic; Future -     HM Diabetes Foot Exam -     Urinalysis, Routine w reflex microscopic -     Hemoglobin A1c -     Microalbumin / creatinine urine ratio -     Basic metabolic panel -     losartan (COZAAR) 50 MG tablet; TAKE 1 TABLET BY MOUTH DAILY.  Routine general medical examination at a health care facility- Exam completed, labs reviewed, vaccines reviewed and updated, cancer screenings are up-to-date, patient education was given. -  Lipid panel; Future -     PSA; Future -     PSA -     Lipid panel  Vitamin D deficiency- His vitamin D level is normal now. -     VITAMIN D 25 Hydroxy (Vit-D Deficiency, Fractures); Future -     VITAMIN D 25 Hydroxy (Vit-D Deficiency, Fractures)  Primary hypertension- His blood pressure is adequately well controlled. -     Basic metabolic panel; Future -     CBC with Differential/Platelet; Future -     Hepatic function  panel; Future -     TSH; Future -     Urinalysis, Routine w reflex microscopic; Future -     Urinalysis, Routine w reflex microscopic -     TSH -     Hepatic function panel -     CBC with Differential/Platelet -     Basic metabolic panel -     EKG 14-HFWY -     losartan (COZAAR) 50 MG tablet; TAKE 1 TABLET BY MOUTH DAILY.  Hyperlipidemia with target LDL less than 100- LDL goal achieved. Doing well on the statin  -     Hepatic function panel; Future -     TSH; Future -     TSH -     Hepatic function panel -     atorvastatin (LIPITOR) 80 MG tablet; Take 1 tablet (80 mg total) by mouth daily.  Nonrheumatic pulmonary valve stenosis- I recommend that he get an updated ECHO done. 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  The evaluation for secondary causes is unremarkable. -     Thyroid Panel With TSH; Future -     Thyroid Panel With TSH  ADHD (attention deficit hyperactivity disorder), inattentive type -     amphetamine-dextroamphetamine (ADDERALL XR) 20 MG 24 hr capsule; TAKE 1 CAPSULE BY MOUTH ONCE DAILY IN THE MORNING. DUE 07/19/20.  Diabetes 1.5, managed as type 1 (HCC) -     losartan (COZAAR) 50 MG tablet; TAKE 1 TABLET BY MOUTH DAILY.  Current mild episode of major depressive disorder without prior episode (HCC) -     traZODone (DESYREL) 50 MG tablet; TAKE 1 TABLET BY MOUTH AT BEDTIME  I have discontinued Elta Guadeloupe D. [place]'s Turmeric Curcumin, aspirin EC, Silvadene, Biotin, Tart Cherry Advanced, pantoprazole, ondansetron, loperamide, and Carestart COVID-19 Home Test. I have also changed his atorvastatin. Additionally, I am having him maintain his Calcium-Magnesium-Vitamin D (CALCIUM 500 PO), vitamin C, Alpha-Lipoic Acid, b complex vitamins, NON FORMULARY, Bayer Microlet Lancets, Magnesium Oxide, Beclomethasone Dipropionate, azelastine, FreeStyle Libre 2 Reader, [employer] 2 Sensor, Glucagon, Insulin Lispro-aabc, pioglitazone, Insulin  Syringe-Needle U-100, glucose blood, levocetirizine, Vitamin D (Ergocalciferol), metFORMIN, omeprazole, famotidine, amphetamine-dextroamphetamine, losartan, and traZODone.  Meds ordered this encounter  Medications  . amphetamine-dextroamphetamine (ADDERALL XR) 20 MG 24 hr capsule    Sig: TAKE 1 CAPSULE BY MOUTH ONCE DAILY IN THE MORNING. DUE 07/19/20.    Dispense:  30 capsule    Refill:  0  . atorvastatin (LIPITOR) 80 MG tablet    Sig: Take 1 tablet (80 mg total) by mouth daily.    Dispense:  90 tablet    Refill:  1  . losartan (COZAAR) 50 MG tablet    Sig: TAKE 1 TABLET BY MOUTH DAILY.    Dispense:  90 tablet    Refill:  1  . traZODone (DESYREL) 50 MG tablet    Sig: TAKE 1 TABLET BY MOUTH  AT BEDTIME    Dispense:  90 tablet    Refill:  1      Follow-up: Return in about 3 months (around 01/28/2021).  Scarlette Calico, MD

## 2020-10-28 NOTE — Patient Instructions (Signed)
Health Maintenance, Male Adopting a healthy lifestyle and getting preventive care are important in promoting health and wellness. Ask your health care provider about: The right schedule for you to have regular tests and exams. Things you can do on your own to prevent diseases and keep yourself healthy. What should I know about diet, weight, and exercise? Eat a healthy diet  Eat a diet that includes plenty of vegetables, fruits, low-fat dairy products, and lean protein. Do not eat a lot of foods that are high in solid fats, added sugars, or sodium.  Maintain a healthy weight Body mass index (BMI) is a measurement that can be used to identify possible weight problems. It estimates body fat based on height and weight. Your health care provider can help determine your BMI and help you achieve or maintain ahealthy weight. Get regular exercise Get regular exercise. This is one of the most important things you can do for your health. Most adults should: Exercise for at least 150 minutes each week. The exercise should increase your heart rate and make you sweat (moderate-intensity exercise). Do strengthening exercises at least twice a week. This is in addition to the moderate-intensity exercise. Spend less time sitting. Even light physical activity can be beneficial. Watch cholesterol and blood lipids Have your blood tested for lipids and cholesterol at 58 years of age, then havethis test every 5 years. You may need to have your cholesterol levels checked more often if: Your lipid or cholesterol levels are high. You are older than 58 years of age. You are at high risk for heart disease. What should I know about cancer screening? Many types of cancers can be detected early and may often be prevented. Depending on your health history and family history, you may need to have cancer screening at various ages. This may include screening for: Colorectal cancer. Prostate cancer. Skin cancer. Lung  cancer. What should I know about heart disease, diabetes, and high blood pressure? Blood pressure and heart disease High blood pressure causes heart disease and increases the risk of stroke. This is more likely to develop in people who have high blood pressure readings, are of African descent, or are overweight. Talk with your health care provider about your target blood pressure readings. Have your blood pressure checked: Every 3-5 years if you are 18-39 years of age. Every year if you are 40 years old or older. If you are between the ages of 65 and 75 and are a current or former smoker, ask your health care provider if you should have a one-time screening for abdominal aortic aneurysm (AAA). Diabetes Have regular diabetes screenings. This checks your fasting blood sugar level. Have the screening done: Once every three years after age 45 if you are at a normal weight and have a low risk for diabetes. More often and at a younger age if you are overweight or have a high risk for diabetes. What should I know about preventing infection? Hepatitis B If you have a higher risk for hepatitis B, you should be screened for this virus. Talk with your health care provider to find out if you are at risk forhepatitis B infection. Hepatitis C Blood testing is recommended for: Everyone born from 1945 through 1965. Anyone with known risk factors for hepatitis C. Sexually transmitted infections (STIs) You should be screened each year for STIs, including gonorrhea and chlamydia, if: You are sexually active and are younger than 58 years of age. You are older than 58 years of age   and your health care provider tells you that you are at risk for this type of infection. Your sexual activity has changed since you were last screened, and you are at increased risk for chlamydia or gonorrhea. Ask your health care provider if you are at risk. Ask your health care provider about whether you are at high risk for HIV.  Your health care provider may recommend a prescription medicine to help prevent HIV infection. If you choose to take medicine to prevent HIV, you should first get tested for HIV. You should then be tested every 3 months for as long as you are taking the medicine. Follow these instructions at home: Lifestyle Do not use any products that contain nicotine or tobacco, such as cigarettes, e-cigarettes, and chewing tobacco. If you need help quitting, ask your health care provider. Do not use street drugs. Do not share needles. Ask your health care provider for help if you need support or information about quitting drugs. Alcohol use Do not drink alcohol if your health care provider tells you not to drink. If you drink alcohol: Limit how much you have to 0-2 drinks a day. Be aware of how much alcohol is in your drink. In the [place] one drink equals one 12 oz bottle of beer (355 mL), one 5 oz glass of wine (148 mL), or one 1 oz glass of hard liquor (44 mL). General instructions Schedule regular health, dental, and eye exams. Stay current with your vaccines. Tell your health care provider if: You often feel depressed. You have ever been abused or do not feel safe at home. Summary Adopting a healthy lifestyle and getting preventive care are important in promoting health and wellness. Follow your health care provider's instructions about healthy diet, exercising, and getting tested or screened for diseases. Follow your health care provider's instructions on monitoring your cholesterol and blood pressure. This information is not intended to replace advice given to you by your health care provider. Make sure you discuss any questions you have with your healthcare provider. Document Revised: 04/26/2018 Document Reviewed: 04/26/2018 Elsevier Patient Education  2022 Elsevier Inc.  

## 2020-10-29 ENCOUNTER — Other Ambulatory Visit (HOSPITAL_COMMUNITY): Payer: Self-pay

## 2020-10-29 LAB — THYROID PANEL WITH TSH
Free Thyroxine Index: 2.3 (ref 1.4–3.8)
T3 Uptake: 30 % (ref 22–35)
T4, Total: 7.8 ug/dL (ref 4.9–10.5)
TSH: 2.95 mIU/L (ref 0.40–4.50)

## 2020-10-29 MED ORDER — AMPHETAMINE-DEXTROAMPHET ER 20 MG PO CP24
ORAL_CAPSULE | ORAL | 0 refills | Status: DC
  Filled 2020-10-29: qty 30, 30d supply, fill #0

## 2020-10-29 MED ORDER — LOSARTAN POTASSIUM 50 MG PO TABS
ORAL_TABLET | Freq: Every day | ORAL | 1 refills | Status: DC
  Filled 2020-10-29: qty 90, 90d supply, fill #0
  Filled 2021-01-26: qty 90, 90d supply, fill #1

## 2020-10-29 MED ORDER — ATORVASTATIN CALCIUM 80 MG PO TABS
80.0000 mg | ORAL_TABLET | Freq: Every day | ORAL | 1 refills | Status: DC
  Filled 2020-10-29: qty 90, 90d supply, fill #0
  Filled 2021-01-26: qty 90, 90d supply, fill #1

## 2020-10-29 MED ORDER — TRAZODONE HCL 50 MG PO TABS
ORAL_TABLET | Freq: Every day | ORAL | 1 refills | Status: DC
  Filled 2020-10-29: qty 90, 90d supply, fill #0
  Filled 2021-01-26: qty 90, 90d supply, fill #1

## 2020-10-30 ENCOUNTER — Other Ambulatory Visit (HOSPITAL_COMMUNITY): Payer: Self-pay

## 2020-11-05 ENCOUNTER — Other Ambulatory Visit: Payer: Self-pay

## 2020-11-05 ENCOUNTER — Ambulatory Visit (HOSPITAL_COMMUNITY): Payer: 59 | Attending: Cardiology

## 2020-11-05 DIAGNOSIS — I370 Nonrheumatic pulmonary valve stenosis: Secondary | ICD-10-CM

## 2020-11-05 LAB — ECHOCARDIOGRAM COMPLETE
AR max vel: 2.26 cm2
AV Area VTI: 2.27 cm2
AV Area mean vel: 2.04 cm2
AV Mean grad: 5.5 mmHg
AV Peak grad: 9.5 mmHg
Ao pk vel: 1.54 m/s
Area-P 1/2: 3.78 cm2
S' Lateral: 2.6 cm

## 2020-11-10 ENCOUNTER — Other Ambulatory Visit: Payer: Self-pay | Admitting: Family Medicine

## 2020-11-10 ENCOUNTER — Other Ambulatory Visit: Payer: Self-pay | Admitting: Internal Medicine

## 2020-11-10 ENCOUNTER — Other Ambulatory Visit (HOSPITAL_COMMUNITY): Payer: Self-pay

## 2020-11-10 DIAGNOSIS — J301 Allergic rhinitis due to pollen: Secondary | ICD-10-CM

## 2020-11-10 MED ORDER — VITAMIN D (ERGOCALCIFEROL) 1.25 MG (50000 UNIT) PO CAPS
ORAL_CAPSULE | ORAL | 0 refills | Status: DC
  Filled 2020-11-10: qty 12, 84d supply, fill #0

## 2020-11-10 MED ORDER — LEVOCETIRIZINE DIHYDROCHLORIDE 5 MG PO TABS
ORAL_TABLET | Freq: Every evening | ORAL | 1 refills | Status: DC
  Filled 2020-11-10: qty 90, 90d supply, fill #0
  Filled 2021-01-26: qty 90, 90d supply, fill #1

## 2020-11-10 MED FILL — Glucose Blood Test Strip: 90 days supply | Qty: 400 | Fill #0 | Status: CN

## 2020-11-11 ENCOUNTER — Other Ambulatory Visit (HOSPITAL_COMMUNITY): Payer: Self-pay

## 2020-11-28 DIAGNOSIS — Z91013 Allergy to seafood: Secondary | ICD-10-CM | POA: Diagnosis not present

## 2020-11-28 DIAGNOSIS — K219 Gastro-esophageal reflux disease without esophagitis: Secondary | ICD-10-CM | POA: Diagnosis not present

## 2020-11-28 DIAGNOSIS — J300 Vasomotor rhinitis: Secondary | ICD-10-CM | POA: Diagnosis not present

## 2020-12-02 NOTE — Progress Notes (Signed)
Zachary Graham Phone: [phone] Subjective:   I Zachary Graham am serving as a Education administrator for Dr. Hulan Saas.  This visit occurred during the SARS-CoV-2 public health emergency.  Safety protocols were in place, including screening questions prior to the visit, additional usage of staff PPE, and extensive cleaning of exam room while observing appropriate contact time as indicated for disinfecting solutions.   I'm seeing this patient by the request  of:  Zachary Lima, MD  CC: Back pain follow-up, left knee pain follow-up  [id number]  Zachary Graham is a 60 y.o. male coming in with complaint of back pain. OMT 09/10/2020. Also f/u for L knee pain. Patient states he is doing well. States he still feels pain in the knee. States it feels like pressure.  Patient states that the knee do not does not stop him from any activities.  Has noticed more tightness in the back.  Wants to have more aggressive therapy potentially.  Medications patient has been prescribed: Vit D  Taking: Yes         Reviewed prior external information including notes and imaging from previsou exam, outside providers and external EMR if available.   As well as notes that were available from care everywhere and other healthcare systems.  Past medical history, social, surgical and family history all reviewed in electronic medical record.  No pertanent information unless stated regarding to the chief complaint.   Past Medical History:  Diagnosis Date   Diabetes mellitus    GERD (gastroesophageal reflux disease)    Heart murmur    Hyperlipidemia    Hypertension    OSA (obstructive sleep apnea)    Pneumonia     Allergies  Allergen Reactions   Lisinopril Swelling and Other (See Comments)    Extreme facial swelling causing hospitalization   Shellfish Allergy Anaphylaxis   Zetia [Ezetimibe] Other (See Comments)    ABDOMINAL CRAMPING    Dulaglutide Other (See Comments)   Gabapentin     Other reaction(s): Dizziness   Gabapentin (Once-Daily) Other (See Comments)   Iodine    Penicillins Other (See Comments)    UNSPECIFIED REACTION > Childhood allergy     Cymbalta [Duloxetine Hcl] Other (See Comments)    Severe feet cramps     Review of Systems:  No headache, visual changes, nausea, vomiting, diarrhea, constipation, dizziness, abdominal pain, skin rash, fevers, chills, night sweats, weight loss, swollen lymph nodes, body aches, joint swelling, chest pain, shortness of breath, mood changes. POSITIVE muscle aches  Objective  Blood pressure (!) 150/80, pulse 76, height 5\' 10"  (1.778 m), weight 221 lb (100.2 kg), SpO2 97 %.   General: No apparent distress alert and oriented x3 mood and affect normal, dressed appropriately.  HEENT: Pupils equal, extraocular movements intact  Respiratory: Patient's speak in full sentences and does not appear short of breath  Cardiovascular: No lower extremity edema, non tender, no erythema  Left knee exam shows very mild lateral tracking of the patella with mild positive grind test.  Negative McMurray's.  Full range of motion otherwise. Low back exam shows loss of lordosis.  Tightness noted more in the sacroiliac joint bilaterally.  He does have weakness to hip abductors bilaterally.  Tightness with hip abductors.  Osteopathic findings  T3 extended rotated and side bent right inhaled rib T9 extended rotated and side bent left L2 flexed rotated and side bent right Sacrum right on right  Assessment and Plan:  Patellofemoral stress syndrome of left knee Stable overall.  Not stopping them.  Discussed the potential for physical therapy.  Patient will consider it but feels like he needs it a little more for his back.  Patient wants to continue to be active and attempt to lose more weight.  Follow-up with me again in 6 to 8 weeks  Low back pain Chronic problem with mild exacerbation.   We will get patient to physical therapy.  Prescription ordered today.  Patient will look at this and see how he does.  Does respond well to manipulation.  Follow-up again in 6 to 8 weeks   Nonallopathic problems  Decision today to treat with OMT was based on Physical Exam  After verbal consent patient was treated with HVLA, ME, FPR techniques in  thoracic, lumbar, and sacral  areas  Patient tolerated the procedure well with improvement in symptoms  Patient given exercises, stretches and lifestyle modifications  See medications in patient instructions if given  Patient will follow up in 4-8 weeks     The above documentation has been reviewed and is accurate and complete Zachary Pulley, DO        Note: This dictation was prepared with Dragon dictation along with smaller phrase technology. Any transcriptional errors that result from this process are unintentional.

## 2020-12-03 ENCOUNTER — Ambulatory Visit (INDEPENDENT_AMBULATORY_CARE_PROVIDER_SITE_OTHER): Payer: 59 | Admitting: Family Medicine

## 2020-12-03 ENCOUNTER — Other Ambulatory Visit: Payer: Self-pay

## 2020-12-03 ENCOUNTER — Encounter: Payer: Self-pay | Admitting: Family Medicine

## 2020-12-03 ENCOUNTER — Other Ambulatory Visit (HOSPITAL_COMMUNITY): Payer: Self-pay

## 2020-12-03 ENCOUNTER — Other Ambulatory Visit: Payer: Self-pay | Admitting: Internal Medicine

## 2020-12-03 VITALS — BP 150/80 | HR 76 | Ht 70.0 in | Wt 221.0 lb

## 2020-12-03 DIAGNOSIS — M545 Low back pain, unspecified: Secondary | ICD-10-CM

## 2020-12-03 DIAGNOSIS — M9903 Segmental and somatic dysfunction of lumbar region: Secondary | ICD-10-CM | POA: Diagnosis not present

## 2020-12-03 DIAGNOSIS — M9902 Segmental and somatic dysfunction of thoracic region: Secondary | ICD-10-CM

## 2020-12-03 DIAGNOSIS — M9904 Segmental and somatic dysfunction of sacral region: Secondary | ICD-10-CM

## 2020-12-03 DIAGNOSIS — F900 Attention-deficit hyperactivity disorder, predominantly inattentive type: Secondary | ICD-10-CM

## 2020-12-03 DIAGNOSIS — G8929 Other chronic pain: Secondary | ICD-10-CM

## 2020-12-03 DIAGNOSIS — M222X2 Patellofemoral disorders, left knee: Secondary | ICD-10-CM | POA: Diagnosis not present

## 2020-12-03 MED ORDER — AMPHETAMINE-DEXTROAMPHET ER 20 MG PO CP24
ORAL_CAPSULE | ORAL | 0 refills | Status: DC
  Filled 2020-12-03: qty 30, 30d supply, fill #0

## 2020-12-03 MED FILL — Insulin Lispro-aabc Inj 100 Unit/ML: INTRAMUSCULAR | 41 days supply | Qty: 50 | Fill #0 | Status: AC

## 2020-12-03 MED FILL — Pioglitazone HCl Tab 45 MG (Base Equiv): ORAL | 90 days supply | Qty: 45 | Fill #0 | Status: AC

## 2020-12-03 NOTE — Assessment & Plan Note (Signed)
Stable overall.  Not stopping them.  Discussed the potential for physical therapy.  Patient will consider it but feels like he needs it a little more for his back.  Patient wants to continue to be active and attempt to lose more weight.  Follow-up with me again in 6 to 8 weeks

## 2020-12-03 NOTE — Patient Instructions (Signed)
Good to see you PT [facility] for your back Continue to watch the knee See me again in 6-8 weeks

## 2020-12-03 NOTE — Assessment & Plan Note (Signed)
Chronic problem with mild exacerbation.  We will get patient to physical therapy.  Prescription ordered today.  Patient will look at this and see how he does.  Does respond well to manipulation.  Follow-up again in 6 to 8 weeks

## 2020-12-04 ENCOUNTER — Other Ambulatory Visit (HOSPITAL_COMMUNITY): Payer: Self-pay

## 2020-12-05 ENCOUNTER — Encounter: Payer: Self-pay | Admitting: Internal Medicine

## 2020-12-05 ENCOUNTER — Ambulatory Visit: Payer: 59 | Admitting: Internal Medicine

## 2020-12-05 ENCOUNTER — Other Ambulatory Visit: Payer: Self-pay

## 2020-12-05 VITALS — BP 120/72 | HR 66 | Ht 70.0 in | Wt 221.4 lb

## 2020-12-05 DIAGNOSIS — E1065 Type 1 diabetes mellitus with hyperglycemia: Secondary | ICD-10-CM

## 2020-12-05 DIAGNOSIS — E785 Hyperlipidemia, unspecified: Secondary | ICD-10-CM | POA: Diagnosis not present

## 2020-12-05 LAB — POCT GLYCOSYLATED HEMOGLOBIN (HGB A1C): Hemoglobin A1C: 7.6 % — AB (ref 4.0–5.6)

## 2020-12-05 NOTE — Patient Instructions (Addendum)
Please use the following pump settings: - Basal rates: 12 AM: 1.15 3 AM: 1.10 4 AM: 1.20 6:30 AM: 1.40 8 AM: 1.90 10 AM: 1.65 12 PM: 1.45 4 PM: 1.20 8 PM: 1.45 9 PM: 1.55 10:30 PM: 1.30  - bolus:  - Insulin to carb ratio: 1:4 - Insulin sensitivity factor:  25 - target: 100-120 - Active insulin time: 3 hours  Please continue: - Metformin 1000 mg 2x with meals  - Actos 22.5 mg daily in a.m.  [facility]: - Medtronic + Guardian - Omnipod 5 + Dexcom G6 - t:slim + Dexcom G6  Please come back for a follow-up appointment in 3-4 months.

## 2020-12-05 NOTE — Progress Notes (Signed)
Patient ID: Zachary Graham, male   DOB: Nov 28, 1960, 60 y.o.   MRN: [id number]   This visit occurred during the SARS-CoV-2 public health emergency.  Safety protocols were in place, including screening questions prior to the visit, additional usage of staff PPE, and extensive cleaning of exam room while observing appropriate contact time as indicated for disinfecting solutions.   HPI: Zachary Graham is a 60 y.o.-year-old male, presenting for f/u for DM1, dx 1993, insulin-dependent, uncontrolled, with complications (mild sensory peripheral neuropathy; hypoglycemia episodes, DR). Last visit 4 months ago.  Interim history: Before last visit, after switching insulin in his pump from Humalog to Lyumjev, he lost 15 pounds.  He gained 6 back since then.  He describes that ~1 mo ago, he started DHEA >> he felt better but sugars increased >> he will stop this weekend. No increased urination, blurry vision, nausea, chest pain.  Insulin pump: - Started in 2006 - He is on Medtronic 670 G pump  CGM: -Libre CGM as a backup -now using a Dexcom G6 CGM  Insulin: -Humalog >> Lyumjev now (50 vials for 3 months)  Reviewed HbA1c levels: Lab Results  Component Value Date   HGBA1C 8.3 (H) 10/28/2020   HGBA1C 7.2 (A) 07/24/2020   HGBA1C 7.2 (A) 03/26/2020   HGBA1C 7.3 (A) 11/20/2019   HGBA1C 7.4 (A) 08/02/2019   HGBA1C 7.4 (A) 04/06/2019   HGBA1C 7.6 (A) 12/08/2018   HGBA1C 8.2 (H) 10/19/2018   HGBA1C 7.7 (A) 05/02/2018   HGBA1C 7.3 (A) 01/05/2018   HGBA1C 8.1 (A) 10/03/2017   HGBA1C 7.6 03/10/2017   HGBA1C 9.7 (H) 12/02/2016   HGBA1C 7.3 04/22/2016   HGBA1C 7.8 02/17/2016   HGBA1C 8.0 11/13/2015   HGBA1C 7.8 (H) 10/14/2015   HGBA1C 8.1 05/30/2015   HGBA1C 7.6 01/30/2015   HGBA1C 7.6 (H) 11/14/2014  10/2019: HbA1c calculated from fructosamine: 6.3%  Pump settings: - Basal rates: 12 AM: 1.10 >> 1.15 3 AM: 1.05 >> 1.10 4 AM: 1.20 6:30 AM: 1.40 8 AM: 1.80 >> 1.90 10 AM: 1.65 12 PM: 1.45 4  PM: 1.20 8 PM: 1.45 9 PM: 1.55 10:30 PM: 1.30  - bolus:  - Insulin to carb ratio:  12 AM: 4 11 AM: 4 5 PM:  4 - Insulin sensitivity factor:  25 - target: 100-120 - Active insulin time: 3 hours  TDD basal: 74 units (36%)  >> 35% (74.3 units) >> 57% >> 40% >> 39% TDD bolus: 131 units (64%) >>  65% (136.5 units) >> 43% >> 50% >> 61% TDD 122 units >> 210-240  >> 85-100 units a day - Bolus wizard: On - Changes the pump site:  every 1-2 >> now 2-4 days   He is also on:  - Metformin 500 >> 1000 mg twice a day - Actos 45 mg daily-started 12/2019 >> decreased to 22.5 mg in 38/8828 We tried Trulicy in 00/3491 but he stopped in 02/2019 due to nausea and abdominal pain.  He checks his sugars more than 4 times a day with his Dexcom CGM:   Previously:   Previously:   Lowest: 50 Highest: 300 He has a history of a severe hypoglycemic episode in 2016.  He has a glucagon kit at home (refilled by the [place]). No previous DKA admissions.  -No CKD, last BUN/creatinine:  Lab Results  Component Value Date   BUN 9 10/28/2020   CREATININE 0.75 10/28/2020  04/11/2018: 10/0.778, ACR 11.1 On Cozaar.  -+ HL; last set of lipids: Lab  Results  Component Value Date   CHOL 109 10/28/2020   HDL 42.60 10/28/2020   LDLCALC 45 10/28/2020   LDLDIRECT 87.0 12/02/2016   TRIG 109.0 10/28/2020   CHOLHDL 3 10/28/2020  06/23/2020: 79/74/43/21 On high-dose Lipitor.  - last eye exam was in 12/2019: No DR, previously + Mild NPDR. [facility].   -+ Numbness and tingling in his feet.  This has improved on the B complex  He also has a history of GERD, HTN, OSA.  Latest TSH was normal: Lab Results  Component Value Date   TSH 2.95 10/28/2020   TSH 3.11 10/28/2020  04/11/2018: TSH 3.02  Vitamin D levels have been normal: Lab Results  Component Value Date   VD25OH 74.87 10/28/2020   VD25OH 59.85 10/23/2019   VD25OH 68.22 10/19/2018   VD25OH 83.45 10/03/2017   VD25OH 41.95 06/06/2015   VD25OH  35.03 07/17/2014   VD25OH 44 03/01/2013  04/11/2018: vitamin D 46.7.   Previously on ergocalciferol.  He has adhesive capsulitis >> improving.  He is getting occasional steroid injections but we discussed to reduce these as much as possible.  On meloxicam. Colonoscopy this year >> no pathology.  ROS: Constitutional: + weight gain, no fatigue, no subjective hyperthermia, no subjective hypothermia Eyes: no blurry vision, no xerophthalmia ENT: no sore throat, no nodules palpated in neck, no dysphagia, no odynophagia Cardiovascular: no CP/no SOB/no palpitations/no leg swelling Respiratory: no cough/no SOB/no wheezing Gastrointestinal: no N/no V/no D/no C/no acid reflux Musculoskeletal: no muscle aches/no joint aches Skin: no rashes, no hair loss Neurological: no tremors/+ numbness/+ tingling/no dizziness  I reviewed pt's medications, allergies, PMH, social hx, family hx, and changes were documented in the history of present illness. Otherwise, unchanged from my initial visit note.   Past Medical History:  Diagnosis Date   Diabetes mellitus    GERD (gastroesophageal reflux disease)    Heart murmur    Hyperlipidemia    Hypertension    OSA (obstructive sleep apnea)    Pneumonia    Past Surgical History:  Procedure Laterality Date   CERVICAL DISC ARTHROPLASTY N/A 03/16/2017   Procedure: CERVICAL FIVE- CERVICAL SIX [place] ARTHROPLASTY;  Surgeon: Jovita Gamma, MD;  Location: [facility];  Service: Neurosurgery;  Laterality: N/A;  CERVICAL 5- CERVICAL 6 DISC ARTHROPLASTY   COLONOSCOPY     TONSILLECTOMY     Social History   Socioeconomic History   Marital status: Married    Spouse name: Not on file   Number of children: 3   Years of education: Not on file   Highest education level: Not on file  Occupational History   Occupation: RADIATION SAFETY OFFICER    Employer: [employer]  Tobacco Use   Smoking status: Never   Smokeless tobacco: Never  Vaping Use   Vaping Use: Never used   Substance and Sexual Activity   Alcohol use: No    Alcohol/week: 0.0 standard drinks   Drug use: No   Sexual activity: Yes    Partners: Female  Other Topics Concern   Not on file  Social History Narrative   Regular exercise: runs 3 miles 3x week, takes one day off per week to rest   Caffeine use: stopped all diet soda on 04/23/15 per advice from Sports MD   Social Determinants of Health   Financial Resource Strain: Not on file  Food Insecurity: Not on file  Transportation Needs: Not on file  Physical Activity: Not on file  Stress: Not on file  Social Connections: Not  on file  Intimate Partner Violence: Not on file   Current Outpatient Medications on File Prior to Visit  Medication Sig Dispense Refill   Alpha-Lipoic Acid 600 MG CAPS Take 1 capsule by mouth daily.      amphetamine-dextroamphetamine (ADDERALL XR) 20 MG 24 hr capsule TAKE 1 CAPSULE BY MOUTH ONCE DAILY IN THE MORNING. 30 capsule 0   Ascorbic Acid (VITAMIN C) 1000 MG tablet Take 1,000 mg by mouth daily.     atorvastatin (LIPITOR) 80 MG tablet Take 1 tablet (80 mg total) by mouth daily. 90 tablet 1   azelastine (ASTELIN) 0.1 % nasal spray Place 2 sprays into both nostrils 2 (two) times daily. Use in each nostril as directed 90 mL 1   b complex vitamins tablet Take 1 tablet by mouth daily.     BAYER MICROLET LANCETS lancets USE TO TEST BLOOD SUGAR 6 TIMES A DAY 300 each 11   Beclomethasone Dipropionate (QNASL) 80 MCG/ACT AERS Place 2 puffs into the nose daily as needed (allergies). 10.6 g 5   Calcium-Magnesium-Vitamin D (CALCIUM 500 PO) Take 1 tablet by mouth daily.      Continuous Blood Gluc Receiver (FREESTYLE LIBRE 2 READER) DEVI 1 each by Does not apply route daily. 1 each 0   Continuous Blood Gluc Sensor (FREESTYLE LIBRE 2 SENSOR) MISC 1 each by Does not apply route every 14 (fourteen) days. 6 each 3   famotidine (PEPCID) 40 MG tablet Take 1 tablet by mouth at bedtime.     Glucagon 3 MG/DOSE POWD PLACE 3 MG INTO THE  NOSE ONCE AS NEEDED FOR UP TO 1 DOSE. 1 each 11   glucose blood test strip USE AS INSTRUCTED TO CHECK SUGAR 6 TIMES DAILY (Patient taking differently: USE AS INSTRUCTED TO CHECK SUGAR 6 TIMES DAILY) 400 strip 5   Insulin Lispro-aabc 100 UNIT/ML SOLN USE UP TO 120 UNITS IN THE INSULIN PUMP DAILY 50 mL 3   Insulin Syringe-Needle U-100 31G X 5/16" 0.5 ML MISC USE 1 - 2 TIMES DAILY AS NEEDED FOR INSULIN INJECTION 30 each 5   levocetirizine (XYZAL) 5 MG tablet TAKE 1 TABLET BY MOUTH IN THE EVENING 90 tablet 1   losartan (COZAAR) 50 MG tablet TAKE 1 TABLET BY MOUTH DAILY. 90 tablet 1   Magnesium Oxide 400 MG CAPS Take 1 capsule (400 mg total) by mouth daily. 90 capsule 1   metFORMIN (GLUCOPHAGE) 500 MG tablet Take 2 tablets (1,000 mg total) by mouth 2 (two) times daily before a meal. 360 tablet 1   NON FORMULARY CPAP     omeprazole (PRILOSEC) 40 MG capsule TAKE ONE CAPSULE BY MOUTH TWICE A DAY BEFORE MEALS (TAKE ON AN EMPTY STOMACH 30 MINUTES PRIOR TO A MEAL)     pioglitazone (ACTOS) 45 MG tablet TAKE 1/2 TABLET BY MOUTH ONCE DAILY 45 tablet 2   traZODone (DESYREL) 50 MG tablet TAKE 1 TABLET BY MOUTH AT BEDTIME 90 tablet 1   Vitamin D, Ergocalciferol, (DRISDOL) 1.25 MG (50000 UNIT) CAPS capsule TAKE 1 CAPSULE BY MOUTH EVERY 7 DAYS 12 capsule 0   No current facility-administered medications on file prior to visit.   Allergies  Allergen Reactions   Lisinopril Swelling and Other (See Comments)    Extreme facial swelling causing hospitalization   Shellfish Allergy Anaphylaxis   Zetia [Ezetimibe] Other (See Comments)    ABDOMINAL CRAMPING   Dulaglutide Other (See Comments)   Gabapentin     Other reaction(s): Dizziness   Gabapentin (Once-Daily) Other (  See Comments)   Iodine    Penicillins Other (See Comments)    UNSPECIFIED REACTION > Childhood allergy     Cymbalta [Duloxetine Hcl] Other (See Comments)    Severe feet cramps   Family History  Problem Relation Age of Onset   Colon cancer  Maternal Grandfather    Colon cancer Paternal Grandfather    Heart disease Father        Valve replacement; pacemaker   Alcohol abuse Neg Hx    Asthma Neg Hx    Diabetes Neg Hx    Hyperlipidemia Neg Hx    Hypertension Neg Hx    Kidney disease Neg Hx    Stroke Neg Hx     PE: BP 120/72 (BP Location: Right Arm, Patient Position: Sitting, Cuff Size: Normal)   Pulse 66   Ht 5' 10"  (1.778 m)   Wt 221 lb 6.4 oz (100.4 kg)   SpO2 97%   BMI 31.77 kg/m  Body mass index is 31.77 kg/m.  Wt Readings from Last 3 Encounters:  12/05/20 221 lb 6.4 oz (100.4 kg)  12/03/20 221 lb (100.2 kg)  10/28/20 217 lb (98.4 kg)   Constitutional: overweight, in NAD Eyes: PERRLA, EOMI, no exophthalmos ENT: moist mucous membranes, no thyromegaly, no cervical lymphadenopathy Cardiovascular: RRR, No MRG Respiratory: CTA B Gastrointestinal: abdomen soft, NT, ND, BS+ Musculoskeletal: no deformities, strength intact in all 4 Skin: moist, warm, no rashes Neurological: no tremor with outstretched hands, DTR normal in all 4  ASSESSMENT: 1. DM1, insulin-dependent, uncontrolled, without complications - DR - PN  - Barriers to good control: Very busy schedule >> less time to check sugars and eat but But he does a great job with bolusing during the day >> still has a lot of large boluses during the day, sometimes without documented carbs or sugars Lack of sleep >> usually sleeps max 5h a night (!) and tries to catch up in the weekend Reaches home late at night  - sometimes at 1 am >> eats >65% of the daily calories then  2. HL  PLAN:  1. Patient with   longstanding, uncontrolled type 1 diabetes, with high insulin resistance, managed on an insulin pump but with improved control after adding Actos 45 mg daily.  His insulin requirements decreased by 40 to 45% afterwards. We decreased the dose of Actos to half a tablet last year after a discussion about possible side effects, and he continues on 22.5 mg daily  now.  At last visit, we switched to Lyumjev and this caused him to lose 15 pounds.  He had to decrease his insulin doses as his sugars started to drop afterwards.  CGM interpretation: -At today's visit, we reviewed his CGM downloads: It appears that 60% of values are in target range (goal >70%), while 37% are higher than 180 (goal <25%), and 3% are lower than 70 (goal <4%).  The calculated average blood sugar is 172.  The projected HbA1c for the next 3 months (GMI) is 7.4%. -Reviewing the CGM trends, it appears that the sugars are higher than before, without consistent patterns.  Reviewing the DEXCOM tracings, there undulating around the upper limit of the target interval, with hyperglycemic spikes after every meal and also during the night, as he is also eating then.  He has long hours at work and is sleeping less than 5 hours a night.  We again discussed that this is not healthy.  He feels that he is now staying out more and has  less need for sleep after he started the DHEA supplement.  I agree with him that this may have the blood sugars and advised him to stop.  This will allow him to rest tomorrow and I feel that the sugars would improve after stopping. -We also discussed about bolusing before every meal he is doing with his blood may not enter enough carbs during the day.  It appears that he is using more insulin from boluses per day then basal, which may be appropriate for him. -We again discussed about trying to obtain a pump that integrates with the sensor in the closed-loop surgically.  [facility] is going [facility] with [facility] and [facility] about these and let me know what he decides. - I advised him to:  Patient Instructions  Please use the following pump settings: - Basal rates: 12 AM: 1.15 3 AM: 1.10 4 AM: 1.20 6:30 AM: 1.40 8 AM: 1.90 10 AM: 1.65 12 PM: 1.45 4 PM: 1.20 9 PM: 1.55 10:30 PM: 1.30  - bolus:  - Insulin to carb ratio: 1:4 - Insulin sensitivity factor:  25 - target:  100-120 - Active insulin time: 3 hours  Please continue: - Metformin 1000 mg 2x with meals  - Actos 22.5 mg daily in a.m.  [facility]: - Medtronic + Guardian - Omnipod + Dexcom G7 - t:slim + Dexcom G7  Please come back for a follow-up appointment in 3-4 months.  - advised to check sugars at different times of the day - 4x a day, rotating check times - advised for yearly eye exams >> he is UTD - return to clinic in 3-4 months  2. HL -Reviewed latest lipid panel from 10/2020: All fractions at goal: Lab Results  Component Value Date   CHOL 109 10/28/2020   HDL 42.60 10/28/2020   LDLCALC 45 10/28/2020   LDLDIRECT 87.0 12/02/2016   TRIG 109.0 10/28/2020   CHOLHDL 3 10/28/2020  -Continues Lipitor 80 mg daily without side effects   Philemon Kingdom, MD PhD [facility]

## 2020-12-24 ENCOUNTER — Encounter (HOSPITAL_BASED_OUTPATIENT_CLINIC_OR_DEPARTMENT_OTHER): Payer: Self-pay | Admitting: Physical Therapy

## 2020-12-24 ENCOUNTER — Ambulatory Visit (HOSPITAL_BASED_OUTPATIENT_CLINIC_OR_DEPARTMENT_OTHER): Payer: 59 | Attending: Family Medicine | Admitting: Physical Therapy

## 2020-12-24 ENCOUNTER — Other Ambulatory Visit: Payer: Self-pay

## 2020-12-24 DIAGNOSIS — M6281 Muscle weakness (generalized): Secondary | ICD-10-CM | POA: Insufficient documentation

## 2020-12-24 DIAGNOSIS — G8929 Other chronic pain: Secondary | ICD-10-CM | POA: Insufficient documentation

## 2020-12-24 DIAGNOSIS — M545 Low back pain, unspecified: Secondary | ICD-10-CM | POA: Diagnosis not present

## 2020-12-24 DIAGNOSIS — M25562 Pain in left knee: Secondary | ICD-10-CM | POA: Diagnosis not present

## 2020-12-24 NOTE — Therapy (Signed)
[facility] [address] [place], [place], [address] Phone: [phone]   Fax:  [phone]  Physical Therapy Evaluation  Patient Details  Name: Zachary Graham MRN: [id number] Date of Birth: May 19, 1960 Referring Provider (PT): Dr. [name]   Encounter Date: 12/24/2020   PT End of Session - 12/24/20 1854     Visit Number 1    Number of Visits 12    Date for PT Re-Evaluation 03/24/21    Authorization Type [name]    PT Start Time 1645    PT Stop Time [id number]    PT Time Calculation (min) 60 min    Activity Tolerance Patient tolerated treatment well    Behavior During Therapy [facility] for tasks assessed/performed             Past Medical History:  Diagnosis Date   Diabetes mellitus    GERD (gastroesophageal reflux disease)    Heart murmur    Hyperlipidemia    Hypertension    OSA (obstructive sleep apnea)    Pneumonia     Past Surgical History:  Procedure Laterality Date   CERVICAL DISC ARTHROPLASTY N/A 03/16/2017   Procedure: CERVICAL FIVE- CERVICAL SIX [place];  Surgeon: Jovita Gamma, MD;  Location: [facility];  Service: Neurosurgery;  Laterality: N/A;  CERVICAL 5- CERVICAL 6 DISC ARTHROPLASTY   COLONOSCOPY     TONSILLECTOMY      There were no vitals filed for this visit.    Subjective Assessment - 12/24/20 1658     Subjective Pt states the back pain started happen without specific MOI about 3-4 months. Pt describes it as general dull ache in the middle of the lower back. Pt states he has minor/moderate back pain which has affected his L knee pain. He states will occasionally have R sided/hip back pain. Pt states theL knee is bothering him more than the back at the moment. Pt states he was runing with dog about 6-7 months ago and had pain afterward. Pt states the knee pain is anterior to the front of the knee, around the patella. No pain during. Pt states the L knee feels swollen but does not  appear swollen. The knee feels like it may give out. Pt denies specific MOI. Pt states he used to be a runner but no longer running. Pt describes the knee feels "tight" or "swollen" when putting weight on it. Pt denies popping, clicking, catching in knee. Aggs: static positioning, rowing, stairs, walking up and down slopes/hills; Eases: resting. Pt states he rarely gets 5 hours of sleep a night. Pt states he works for [employer] in [employer] and has to sit most of the day for work and has been very stressed the last few months. Pt denies cancer flags.    Limitations Sitting;Standing;Walking;House hold activities;Other (comment)   running   Diagnostic tests IX-ray MPRESSION:  No acute osseous finding    Currently in Pain? Yes    Pain Score 2     Pain Location Back    Pain Orientation Lower    Pain Descriptors / Indicators Aching    Multiple Pain Sites Yes    Pain Score 2    Pain Location Knee    Pain Orientation Left    Pain Descriptors / Indicators Tightness;Squeezing;Hervey Ard                [facility] PT Assessment - 12/24/20 0001       Assessment   Medical Diagnosis M54.50,G89.29 (ICD-10-CM) - Chronic  low back pain, unspecified back pain laterality, unspecified whether sciatica present    Referring Provider (PT) Dr. [name]    Prior Therapy C/S and frozen shoulder      Precautions   Precautions None      Restrictions   Weight Bearing Restrictions No      Balance Screen   Has the patient fallen in the past 6 months No      [place] residence      Prior Function   Level of Independence Independent      Cognition   Overall Cognitive Status Within Functional Limits for tasks assessed      Observation/Other Assessments   Other Surveys  Lower Extremity Functional Scale    Lower Extremity Functional Scale  to be addended      Sensation   Light Touch Appears Intact      Functional Tests   Functional tests Squat;Step up;Step down      Squat    Comments decreased DF, knee flexion, fwd trunk lean, shallow depth   slight L knee discomfort     Step Up   Comments WFL off 3" step      Step Down   Comments WFL off 3" step      Posture/Postural Control   Posture/Postural Control Postural limitations    Postural Limitations Decreased lumbar lordosis      ROM / Strength   AROM / PROM / Strength PROM;AROM;Strength      AROM   Overall AROM Comments Knee WNL bilat, L/S flexion WFL, ext 50%, L&R  SB WFL, L&R rot 70%      PROM   Overall PROM Comments Bilat knee WNL      Strength   Overall Strength Comments hip ABD and ADD 4+/5, L knee extension 4/5, flexion 4+/5, R knee WNL; lumbar flexion 4/5      Flexibility   Soft Tissue Assessment /Muscle Length yes    Hamstrings positive supine 90/90    Quadriceps Positive Ely's      Palpation   Patella mobility decreased sup and inf on L    Palpation comment signficant TTP L quad, inf patellar pole, patellar tendon      Special Tests    Special Tests Knee Special Tests    Knee Special tests  other;other2      other    Findings Negative    Comments Lachmans, ant drawer      other   findings Negative    Comments McMurray, varus valgus      Ambulation/Gait   Gait Pattern Decreased hip/knee flexion - right;Decreased hip/knee flexion - left;Decreased dorsiflexion - right;Decreased dorsiflexion - left    Stairs Yes    Stair Management Technique One rail Right;Alternating pattern   decreased eccentric lower                       Objective measurements completed on examination: See above findings.       [facility] Adult PT Treatment/Exercise - 12/24/20 0001       Exercises   Exercises Knee/Hip            Exercises Seated Hamstring Stretch - 2 x daily - 7 x weekly - 1 sets - 3 reps - 30 hold Sidelying Quadriceps Stretch with Strap - 2 x daily - 7 x weekly - 1 sets - 3 reps - 30 hold Seated Pelvic Tilt - 3-4 x daily - 7  x weekly - 1 sets - 10 reps Seated  Flexion Stretch with Swiss Ball - 2 x daily - 7 x weekly - 1 sets - 10 reps - 5 hold        PT Education - 12/24/20 1854     Education Details MOI, diagnosis, prognosis, anatomy, exercise progression, DOMS expectations, muscle firing, HEP, POC    Person(s) Educated Patient    Methods Explanation;Demonstration;Tactile cues;Verbal cues;Handout    Comprehension Verbalized understanding;Returned demonstration;Verbal cues required;Tactile cues required              PT Short Term Goals - 12/24/20 1856       PT SHORT TERM GOAL #1   Title Pt will become independent with HEP in order to demonstrate synthesis of PT education.    Time 2      PT SHORT TERM GOAL #2   Title Pt will be able to demonstrate below parallel squat without knee pain or back pain in order to demonstrate functional improvement in LE function for self-care and house hold duties.    Time 4    Period Weeks    Status New      PT SHORT TERM GOAL #3   Title Pt will have an at least 9 pt improvement in LEFS measure in order to demonstrate MCID improvement in daily function.               PT Long Term Goals - 12/24/20 1859       PT LONG TERM GOAL #1   Title Pt  will become independent with final HEP in order to demonstrate synthesis of PT education.    Time 8    Period Weeks    Status New      PT LONG TERM GOAL #2   Title Pt will be able to demonstrate/report ability to sit/stand for extended periods of time without pain in order to demonstrate functional improvement and tolerance to static positioning.    Time 8    Period Weeks      PT LONG TERM GOAL #3   Title Pt will be able to demonstrate ability to run/jog without pain in order to demonstrate functional improvement and tolerance to low level plyometric loading.    Time 8    Period Weeks    Status New                    Plan - 12/24/20 1918     Clinical Impression Statement Pt is a 60 y.o. male that presents to PT   eval today for CC  of L knee pain and LBP. Pt's greater concern was his L   knee pain at today's session. Pt presents with decreased L/S and knee ROM,   lumbopelvic and knee weakness, and increased quadriceps spasm. Pt's s/s appear consistent with non-specific LBP and general anterior knee pain/PFPS Pt's   symptoms are minimally irritable and painful. Pt's largest deficits is his  decreased flexibility at the knee and static postural deficits at the L/S.   Pt's impairment limits their participation with ADL, exercise, and daily  mobility. Pt would benefit from continued skilled therapy in order to   reach goals and maximize functional L LE and lumbopelvic strength and ROM   for full return to PLOF.    Stability/Clinical Decision Making Stable/Uncomplicated    Clinical Decision Making Low    Rehab Potential Good    PT Frequency 1x / week    PT Duration  8 weeks    PT Treatment/Interventions ADLs/Self Care Home Management;Aquatic Therapy;Cryotherapy;Electrical Stimulation;Iontophoresis '4mg'$ /ml Dexamethasone;Moist Heat;Traction;Ultrasound;Gait training;Stair training;Functional mobility training;Therapeutic activities;Therapeutic exercise;Balance training;Neuromuscular re-education;Patient/family education;Manual techniques;Passive range of motion;Dry needling;Taping;Vasopneumatic Device;Joint Manipulations;Spinal Manipulations    PT Next Visit Plan quad STM, L/S joint mobs, standing extension, LTR, QL stretch    PT Home Exercise Plan [id number]    Consulted and Agree with Plan of Care Patient             Patient will benefit from skilled therapeutic intervention in order to improve the following deficits and impairments:     Visit Diagnosis: Chronic pain of left knee - Plan: PT plan of care cert/re-cert  Pain, lumbar region - Plan: PT plan of care cert/re-cert  Muscle weakness (generalized) - Plan: PT plan of care cert/re-cert     Problem List Patient Active Problem List   Diagnosis Date Noted   Frequent PVCs  10/28/2020   Hypertension    Low back pain 07/10/2020   Nonallopathic lesion of thoracic region 04/28/2020   Unifocal PVCs 10/23/2019   Chronic male pelvic pain 05/08/2019   Chronic prostatitis without hematuria 05/08/2019   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 ([place]) 03/20/2018   HNP (herniated nucleus pulposus), cervical 03/16/2017   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 ([place]) 01/06/2017   Herniation of cervical intervertebral disc with radiculopathy 01/04/2017   Current mild episode of major depressive disorder without prior episode ([place]) 12/02/2016   Diabetic neuropathy ([place]) 03/01/2016   Obesity, Class II, BMI 35.0-39.9, with comorbidity (see actual BMI) 04/24/2015   Type 1 diabetes mellitus with hyperglycemia, with long-term current use of insulin ([place]) 04/04/2015   Patellofemoral stress syndrome of left knee 03/19/2015   Allergic rhinitis due to pollen 11/14/2014   Equinus deformity of foot, acquired 05/29/2014   ADHD (attention deficit hyperactivity disorder), inattentive type 10/24/2013   Vitamin D deficiency 02/28/2013   Pulmonic stenosis 05/25/2012   Pure hyperglyceridemia 05/25/2012   Hyperlipidemia with target LDL less than 100 09/20/2011   OSA (obstructive sleep apnea) 09/20/2011   BPH (benign prostatic hyperplasia) 09/20/2011   Routine general medical examination at a health care facility 09/20/2011   Daleen Bo PT, DPT 12/24/20 7:33 PM   [facility] [address] [place], [place], [address] Phone: [phone]   Fax:  [phone]  Name: Zachary Graham MRN: [id number] Date of Birth: 11-13-1960

## 2020-12-24 NOTE — Patient Instructions (Signed)
Access Code: [email or web address] URL: [email or web address] Date: 12/24/2020 Prepared by: Daleen Bo  Exercises Seated Hamstring Stretch - 2 x daily - 7 x weekly - 1 sets - 3 reps - 30 hold Sidelying Quadriceps Stretch with Strap - 2 x daily - 7 x weekly - 1 sets - 3 reps - 30 hold Seated Pelvic Tilt - 3-4 x daily - 7 x weekly - 1 sets - 10 reps Seated Flexion Stretch with Swiss Ball - 2 x daily - 7 x weekly - 1 sets - 10 reps - 5 hold

## 2020-12-25 DIAGNOSIS — K219 Gastro-esophageal reflux disease without esophagitis: Secondary | ICD-10-CM | POA: Diagnosis not present

## 2020-12-25 DIAGNOSIS — J310 Chronic rhinitis: Secondary | ICD-10-CM | POA: Diagnosis not present

## 2020-12-25 DIAGNOSIS — I10 Essential (primary) hypertension: Secondary | ICD-10-CM | POA: Diagnosis not present

## 2020-12-25 DIAGNOSIS — N529 Male erectile dysfunction, unspecified: Secondary | ICD-10-CM | POA: Diagnosis not present

## 2020-12-27 LAB — CBC AND DIFFERENTIAL
HCT: 38 — AB (ref 41–53)
Hemoglobin: 12.6 — AB (ref 13.5–17.5)
Platelets: 220 (ref 150–399)
WBC: 7.6

## 2020-12-27 LAB — LIPID PANEL
Cholesterol: 98 (ref 0–200)
HDL: 52 (ref 35–70)
Triglycerides: 74 (ref 40–160)

## 2020-12-27 LAB — BASIC METABOLIC PANEL
BUN: 10 (ref 4–21)
CO2: 30 — AB (ref 13–22)
Chloride: 101 (ref 99–108)
Creatinine: 0.8 (ref 0.6–1.3)
Glucose: 155
Potassium: 4.2 (ref 3.4–5.3)
Sodium: 136 — AB (ref 137–147)

## 2020-12-27 LAB — HEPATIC FUNCTION PANEL
ALT: 26 (ref 10–40)
AST: 17 (ref 14–40)
Alkaline Phosphatase: 87 (ref 25–125)
Bilirubin, Total: 0.5

## 2020-12-27 LAB — HEMOGLOBIN A1C: Hemoglobin A1C: 8

## 2020-12-27 LAB — PSA: PSA: 0.313

## 2020-12-27 LAB — TSH: TSH: 2.48 (ref 0.41–5.90)

## 2021-01-05 ENCOUNTER — Other Ambulatory Visit: Payer: Self-pay | Admitting: Internal Medicine

## 2021-01-05 ENCOUNTER — Other Ambulatory Visit (HOSPITAL_COMMUNITY): Payer: Self-pay

## 2021-01-05 DIAGNOSIS — F900 Attention-deficit hyperactivity disorder, predominantly inattentive type: Secondary | ICD-10-CM

## 2021-01-06 ENCOUNTER — Other Ambulatory Visit (HOSPITAL_COMMUNITY): Payer: Self-pay

## 2021-01-06 MED ORDER — AMPHETAMINE-DEXTROAMPHET ER 20 MG PO CP24
ORAL_CAPSULE | ORAL | 0 refills | Status: DC
  Filled 2021-01-06: qty 30, 30d supply, fill #0

## 2021-01-17 ENCOUNTER — Encounter: Payer: Self-pay | Admitting: Internal Medicine

## 2021-01-26 ENCOUNTER — Other Ambulatory Visit: Payer: Self-pay | Admitting: Family Medicine

## 2021-01-26 ENCOUNTER — Encounter: Payer: Self-pay | Admitting: Internal Medicine

## 2021-01-26 ENCOUNTER — Other Ambulatory Visit (HOSPITAL_COMMUNITY): Payer: Self-pay

## 2021-01-26 MED FILL — Insulin Lispro-aabc Inj 100 Unit/ML: INTRAMUSCULAR | 41 days supply | Qty: 50 | Fill #1 | Status: AC

## 2021-01-27 ENCOUNTER — Other Ambulatory Visit (HOSPITAL_COMMUNITY): Payer: Self-pay

## 2021-01-27 MED ORDER — VITAMIN D (ERGOCALCIFEROL) 1.25 MG (50000 UNIT) PO CAPS
ORAL_CAPSULE | ORAL | 0 refills | Status: DC
  Filled 2021-01-27: qty 12, 84d supply, fill #0

## 2021-01-28 ENCOUNTER — Other Ambulatory Visit: Payer: Self-pay

## 2021-01-28 ENCOUNTER — Other Ambulatory Visit (HOSPITAL_COMMUNITY): Payer: Self-pay

## 2021-01-28 ENCOUNTER — Encounter: Payer: Self-pay | Admitting: Internal Medicine

## 2021-01-28 ENCOUNTER — Ambulatory Visit: Payer: 59 | Admitting: Internal Medicine

## 2021-01-28 VITALS — BP 132/82 | HR 81 | Temp 98.2°F | Ht 70.0 in | Wt 223.0 lb

## 2021-01-28 DIAGNOSIS — D539 Nutritional anemia, unspecified: Secondary | ICD-10-CM | POA: Diagnosis not present

## 2021-01-28 DIAGNOSIS — Z23 Encounter for immunization: Secondary | ICD-10-CM

## 2021-01-28 DIAGNOSIS — E1065 Type 1 diabetes mellitus with hyperglycemia: Secondary | ICD-10-CM | POA: Diagnosis not present

## 2021-01-28 DIAGNOSIS — I10 Essential (primary) hypertension: Secondary | ICD-10-CM | POA: Diagnosis not present

## 2021-01-28 NOTE — Patient Instructions (Signed)
Anemia Anemia is a condition in which there is not enough red blood cells or hemoglobin in the blood. Hemoglobin is a substance in red blood cells that carries oxygen. When you do not have enough red blood cells or hemoglobin (are anemic), your body cannot get enough oxygen and your organs may not work properly. As a result, you may feel very tired or have other problems. What are the causes? Common causes of anemia include: Excessive bleeding. Anemia can be caused by excessive bleeding inside or outside the body, including bleeding from the intestines or from heavy menstrual periods in females. Poor nutrition. Long-lasting (chronic) kidney, thyroid, and liver disease. Bone marrow disorders, spleen problems, and blood disorders. Cancer and treatments for cancer. HIV (human immunodeficiency virus) and AIDS (acquired immunodeficiency syndrome). Infections, medicines, and autoimmune disorders that destroy red blood cells. What are the signs or symptoms? Symptoms of this condition include: Minor weakness. Dizziness. Headache, or difficulties concentrating and sleeping. Heartbeats that feel irregular or faster than normal (palpitations). Shortness of breath, especially with exercise. Pale skin, lips, and nails, or cold hands and feet. Indigestion and nausea. Symptoms may occur suddenly or develop slowly. If your anemia is mild, you may not have symptoms. How is this diagnosed? This condition is diagnosed based on blood tests, your medical history, and a physical exam. In some cases, a test may be needed in which cells are removed from the soft tissue inside of a bone and looked at under a microscope (bone marrow biopsy). Your health care provider may also check your stool (feces) for blood and may do additional testing to look for the cause of your bleeding. Other tests may include: Imaging tests, such as a CT scan or MRI. A procedure to see inside your esophagus and stomach (endoscopy). A  procedure to see inside your colon and rectum (colonoscopy). How is this treated? Treatment for this condition depends on the cause. If you continue to lose a lot of blood, you may need to be treated at a hospital. Treatment may include: Taking supplements of iron, vitamin B12, or folic acid. Taking a hormone medicine (erythropoietin) that can help to stimulate red blood cell growth. Having a blood transfusion. This may be needed if you lose a lot of blood. Making changes to your diet. Having surgery to remove your spleen. Follow these instructions at home: Take over-the-counter and prescription medicines only as told by your health care provider. Take supplements only as told by your health care provider. Follow any diet instructions that you were given by your health care provider. Keep all follow-up visits as told by your health care provider. This is important. Contact a health care provider if: You develop new bleeding anywhere in the body. Get help right away if: You are very weak. You are short of breath. You have pain in your abdomen or chest. You are dizzy or feel faint. You have trouble concentrating. You have bloody stools, black stools, or tarry stools. You vomit repeatedly or you vomit up blood. These symptoms may represent a serious problem that is an emergency. Do not wait to see if the symptoms will go away. Get medical help right away. Call your local emergency services (911 in the [place] Do not drive yourself to the hospital. Summary Anemia is a condition in which you do not have enough red blood cells or enough of a substance in your red blood cells that carries oxygen (hemoglobin). Symptoms may occur suddenly or develop slowly. If your anemia is   mild, you may not have symptoms. This condition is diagnosed with blood tests, a medical history, and a physical exam. Other tests may be needed. Treatment for this condition depends on the cause of the anemia. This  information is not intended to replace advice given to you by your health care provider. Make sure you discuss any questions you have with your health care provider. Document Revised: 04/10/2019 Document Reviewed: 04/10/2019 Elsevier Patient Education  2022 Elsevier Inc.  

## 2021-01-28 NOTE — Progress Notes (Signed)
Subjective:  Patient ID: Zachary Graham, male    DOB: Oct 06, 1960  Age: 60 y.o. MRN: [id number]  CC: Anemia  This visit occurred during the SARS-CoV-2 public health emergency.  Safety protocols were in place, including screening questions prior to the visit, additional usage of staff PPE, and extensive cleaning of exam room while observing appropriate contact time as indicated for disinfecting solutions.    HPI Zachary Graham presents for f/up -  He had labs done at the [facility] about a month ago.  He was found to have a normochromic normocytic anemia.  His A1c was 8.0%.  He denies fatigue, paresthesias, abdominal pain, sources of blood loss, or weight loss.  Outpatient Medications Prior to Visit  Medication Sig Dispense Refill   Alpha-Lipoic Acid 600 MG CAPS Take 1 capsule by mouth daily.      amphetamine-dextroamphetamine (ADDERALL XR) 20 MG 24 hr capsule TAKE 1 CAPSULE BY MOUTH ONCE DAILY IN THE MORNING. 30 capsule 0   Ascorbic Acid (VITAMIN C) 1000 MG tablet Take 1,000 mg by mouth daily.     atorvastatin (LIPITOR) 80 MG tablet Take 1 tablet (80 mg total) by mouth daily. 90 tablet 1   azelastine (ASTELIN) 0.1 % nasal spray Place 2 sprays into both nostrils 2 (two) times daily. Use in each nostril as directed 90 mL 1   b complex vitamins tablet Take 1 tablet by mouth daily.     BAYER MICROLET LANCETS lancets USE TO TEST BLOOD SUGAR 6 TIMES A DAY 300 each 11   Beclomethasone Dipropionate (QNASL) 80 MCG/ACT AERS Place 2 puffs into the nose daily as needed (allergies). 10.6 g 5   Calcium-Magnesium-Vitamin D (CALCIUM 500 PO) Take 1 tablet by mouth daily.      Continuous Blood Gluc Receiver (FREESTYLE LIBRE 2 READER) DEVI 1 each by Does not apply route daily. 1 each 0   Continuous Blood Gluc Sensor (FREESTYLE LIBRE 2 SENSOR) MISC 1 each by Does not apply route every 14 (fourteen) days. 6 each 3   famotidine (PEPCID) 40 MG tablet Take 1 tablet by mouth at bedtime.     Glucagon 3 MG/DOSE POWD PLACE 3 MG  INTO THE NOSE ONCE AS NEEDED FOR UP TO 1 DOSE. 1 each 11   glucose blood test strip USE AS INSTRUCTED TO CHECK SUGAR 6 TIMES DAILY (Patient taking differently: USE AS INSTRUCTED TO CHECK SUGAR 6 TIMES DAILY) 400 strip 5   Insulin Lispro-aabc 100 UNIT/ML SOLN USE UP TO 120 UNITS IN THE INSULIN PUMP DAILY 50 mL 3   Insulin Syringe-Needle U-100 31G X 5/16" 0.5 ML MISC USE 1 - 2 TIMES DAILY AS NEEDED FOR INSULIN INJECTION 30 each 5   levocetirizine (XYZAL) 5 MG tablet TAKE 1 TABLET BY MOUTH IN THE EVENING 90 tablet 1   losartan (COZAAR) 50 MG tablet TAKE 1 TABLET BY MOUTH DAILY. 90 tablet 1   Magnesium Oxide 400 MG CAPS Take 1 capsule (400 mg total) by mouth daily. 90 capsule 1   metFORMIN (GLUCOPHAGE) 500 MG tablet Take 2 tablets (1,000 mg total) by mouth 2 (two) times daily before a meal. 360 tablet 1   NON FORMULARY CPAP     omeprazole (PRILOSEC) 40 MG capsule TAKE ONE CAPSULE BY MOUTH TWICE A DAY BEFORE MEALS (TAKE ON AN EMPTY STOMACH 30 MINUTES PRIOR TO A MEAL)     pioglitazone (ACTOS) 45 MG tablet TAKE 1/2 TABLET BY MOUTH ONCE DAILY 45 tablet 2   traZODone (DESYREL) 50  MG tablet TAKE 1 TABLET BY MOUTH AT BEDTIME 90 tablet 1   Vitamin D, Ergocalciferol, (DRISDOL) 1.25 MG (50000 UNIT) CAPS capsule TAKE 1 CAPSULE BY MOUTH EVERY 7 DAYS 12 capsule 0   No facility-administered medications prior to visit.    ROS Review of Systems  Constitutional:  Negative for diaphoresis and fatigue.  HENT: Negative.    Eyes: Negative.   Respiratory:  Negative for cough, chest tightness, shortness of breath and wheezing.   Cardiovascular:  Negative for chest pain, palpitations and leg swelling.  Gastrointestinal:  Negative for abdominal pain, blood in stool, constipation, diarrhea, nausea and vomiting.  Endocrine: Negative.   Genitourinary: Negative.   Musculoskeletal: Negative.  Negative for arthralgias.  Skin: Negative.  Negative for color change and pallor.  Neurological:  Negative for dizziness, weakness  and light-headedness.  Hematological:  Negative for adenopathy. Does not bruise/bleed easily.  Psychiatric/Behavioral: Negative.     Objective:  BP 132/82 (BP Location: Left Arm, Patient Position: Sitting, Cuff Size: Large)   Pulse 81   Temp 98.2 F (36.8 C) (Oral)   Ht '5\' 10"'$  (1.778 m)   Wt 223 lb (101.2 kg)   SpO2 97%   BMI 32.00 kg/m   BP Readings from Last 3 Encounters:  01/28/21 132/82  12/05/20 120/72  12/03/20 (!) 150/80    Wt Readings from Last 3 Encounters:  01/28/21 223 lb (101.2 kg)  12/05/20 221 lb 6.4 oz (100.4 kg)  12/03/20 221 lb (100.2 kg)    Physical Exam Vitals reviewed.  HENT:     Nose: Nose normal.     Mouth/Throat:     Mouth: Mucous membranes are moist.  Eyes:     General: No scleral icterus.    Conjunctiva/sclera: Conjunctivae normal.  Cardiovascular:     Rate and Rhythm: Normal rate and regular rhythm.     Heart sounds: No murmur heard. Pulmonary:     Effort: Pulmonary effort is normal.     Breath sounds: No stridor. No wheezing, rhonchi or rales.  Abdominal:     General: Abdomen is flat.     Palpations: There is no mass.     Tenderness: There is no abdominal tenderness. There is no guarding.     Hernia: No hernia is present.  Musculoskeletal:        General: Normal range of motion.     Cervical back: Neck supple.  Lymphadenopathy:     Cervical: No cervical adenopathy.  Skin:    General: Skin is warm and dry.     Coloration: Skin is not pale.  Neurological:     General: No focal deficit present.     Mental Status: He is alert.  Psychiatric:        Mood and Affect: Mood normal.        Behavior: Behavior normal.    Lab Results  Component Value Date   WBC 10.6 (H) 01/28/2021   HGB 13.7 01/28/2021   HCT 41.6 01/28/2021   PLT 227.0 01/28/2021   GLUCOSE 139 (H) 10/28/2020   CHOL 98 12/27/2020   TRIG 74 12/27/2020   HDL 52 12/27/2020   LDLDIRECT 87.0 12/02/2016   LDLCALC 45 10/28/2020   ALT 26 12/27/2020   AST 17 12/27/2020    NA 136 (A) 12/27/2020   K 4.2 12/27/2020   CL 101 12/27/2020   CREATININE 0.8 12/27/2020   BUN 10 12/27/2020   CO2 30 (A) 12/27/2020   TSH 2.48 12/27/2020   PSA 0.313 12/27/2020  HGBA1C 8.0 12/27/2020   MICROALBUR 0.9 10/28/2020    No results found.  Assessment & Plan:   Zachary Graham was seen today for anemia.  Diagnoses and all orders for this visit:  Deficiency anemia- His H&H are normal now.  I will screen him for vitamin deficiencies and other causes of anemia. -     CBC with Differential/Platelet; Future -     Vitamin B12; Future -     IBC + Ferritin; Future -     Folate; Future -     Vitamin B1; Future -     Reticulocytes; Future -     Reticulocytes -     Vitamin B1 -     Folate -     IBC + Ferritin -     Vitamin B12 -     CBC with Differential/Platelet  Primary hypertension- His blood pressure is adequately well controlled.  Type 1 diabetes mellitus with hyperglycemia, with long-term current use of insulin ([place])- His A1c is up to 8.0%.  He tells me he will soon see endocrinology to be evaluated for new insulin pump.  Other orders -     Flu Vaccine QUAD 6+ mos PF IM (Fluarix Quad PF) -     Tdap vaccine greater than or equal to 7yo IM  I am having Zachary Graham maintain his Calcium-Magnesium-Vitamin D (CALCIUM 500 PO), vitamin C, Alpha-Lipoic Acid, b complex vitamins, NON FORMULARY, Bayer Microlet Lancets, Magnesium Oxide, Beclomethasone Dipropionate, azelastine, FreeStyle Libre 2 Reader, [employer] 2 Sensor, Glucagon, Insulin Lispro-aabc, pioglitazone, Insulin Syringe-Needle U-100, glucose blood, metFORMIN, omeprazole, famotidine, atorvastatin, losartan, traZODone, levocetirizine, amphetamine-dextroamphetamine, and Vitamin D (Ergocalciferol).  No orders of the defined types were placed in this encounter.    Follow-up: Return in about 3 months (around 04/29/2021).  Scarlette Calico, MD

## 2021-01-29 ENCOUNTER — Encounter: Payer: Self-pay | Admitting: Internal Medicine

## 2021-01-29 LAB — VITAMIN B12: Vitamin B-12: 855 pg/mL (ref 211–911)

## 2021-01-29 LAB — CBC WITH DIFFERENTIAL/PLATELET
Basophils Absolute: 0.1 10*3/uL (ref 0.0–0.1)
Basophils Relative: 0.7 % (ref 0.0–3.0)
Eosinophils Absolute: 0.3 10*3/uL (ref 0.0–0.7)
Eosinophils Relative: 2.5 % (ref 0.0–5.0)
HCT: 41.6 % (ref 39.0–52.0)
Hemoglobin: 13.7 g/dL (ref 13.0–17.0)
Lymphocytes Relative: 22.3 % (ref 12.0–46.0)
Lymphs Abs: 2.4 10*3/uL (ref 0.7–4.0)
MCHC: 33 g/dL (ref 30.0–36.0)
MCV: 84.9 fl (ref 78.0–100.0)
Monocytes Absolute: 0.8 10*3/uL (ref 0.1–1.0)
Monocytes Relative: 7.9 % (ref 3.0–12.0)
Neutro Abs: 7.1 10*3/uL (ref 1.4–7.7)
Neutrophils Relative %: 66.6 % (ref 43.0–77.0)
Platelets: 227 10*3/uL (ref 150.0–400.0)
RBC: 4.9 Mil/uL (ref 4.22–5.81)
RDW: 14.3 % (ref 11.5–15.5)
WBC: 10.6 10*3/uL — ABNORMAL HIGH (ref 4.0–10.5)

## 2021-01-29 LAB — FOLATE: Folate: >23.4 ng/mL (ref >5.9)

## 2021-01-29 LAB — IBC + FERRITIN
Ferritin: 46.2 ng/mL (ref 22.0–322.0)
Iron: 46 ug/dL (ref 42–165)
Saturation Ratios: 10 % — ABNORMAL LOW (ref 20.0–50.0)
TIBC: 459.2 ug/dL — ABNORMAL HIGH (ref 250.0–450.0)
Transferrin: 328 mg/dL (ref 212.0–360.0)

## 2021-02-02 ENCOUNTER — Other Ambulatory Visit: Payer: Self-pay | Admitting: Internal Medicine

## 2021-02-02 ENCOUNTER — Other Ambulatory Visit (HOSPITAL_COMMUNITY): Payer: Self-pay

## 2021-02-02 DIAGNOSIS — D508 Other iron deficiency anemias: Secondary | ICD-10-CM | POA: Insufficient documentation

## 2021-02-02 DIAGNOSIS — F900 Attention-deficit hyperactivity disorder, predominantly inattentive type: Secondary | ICD-10-CM

## 2021-02-02 LAB — RETICULOCYTES
ABS Retic: 62920 cells/uL (ref 25000–90000)
Retic Ct Pct: 1.3 %

## 2021-02-02 LAB — VITAMIN B1: Vitamin B1 (Thiamine): 37 nmol/L — ABNORMAL HIGH (ref 8–30)

## 2021-02-02 MED ORDER — AMPHETAMINE-DEXTROAMPHET ER 20 MG PO CP24
ORAL_CAPSULE | ORAL | 0 refills | Status: DC
Start: 2021-02-02 — End: 2021-03-04
  Filled 2021-02-02: qty 30, fill #0
  Filled 2021-02-03: qty 30, 30d supply, fill #0

## 2021-02-02 MED ORDER — FERROUS SULFATE 325 (65 FE) MG PO TABS
325.0000 mg | ORAL_TABLET | Freq: Every day | ORAL | 0 refills | Status: AC
  Filled 2021-02-02 – 2022-01-05 (×3): qty 90, 90d supply, fill #0

## 2021-02-02 MED FILL — Glucose Blood Test Strip: 30 days supply | Qty: 200 | Fill #0 | Status: AC

## 2021-02-02 NOTE — Progress Notes (Signed)
[place] [facility] Zachary Graham: [phone] Subjective:   [name], am serving as a scribe for Dr. Hulan Saas.  This visit occurred during the SARS-CoV-2 public health emergency.  Safety protocols were in place, including screening questions prior to the visit, additional usage of staff PPE, and extensive cleaning of exam room while observing appropriate contact time as indicated for disinfecting solutions.   I'm seeing this patient by the request  of:  [name], MD  CC: neck and back pain   [id number]  EDHER GING is a 60 y.o. male coming in with complaint of back and neck pain. OMT on 12/03/2020. Also seen for left knee pain. Stable overall.  Not stopping them.  Discussed the potential for physical therapy.  Patient will consider it but feels like he needs it a little more for his back.  Patient wants to continue to be active and attempt to lose more weight.  Follow-up with me again in 6 to 8 weeks Patient states that his back and knee pain have improved. Did one session of PT and is now stretches at home. Patient has been using iron supplement for low iron. Patient does note being fatigued. Also notes forgetfulness more recently.   Medications patient has been prescribed: Vit D        Reviewed prior external information including notes and imaging from previsou exam, outside providers and external EMR if available.   As well as notes that were available from care everywhere and other healthcare systems.  Past medical history, social, surgical and family history all reviewed in electronic medical record.  No pertanent information unless stated regarding to the chief complaint.   Past Medical History:  Diagnosis Date   Diabetes mellitus    GERD (gastroesophageal reflux disease)    Heart murmur    Hyperlipidemia    Hypertension    OSA (obstructive sleep apnea)    Pneumonia     Allergies  Allergen  Reactions   Lisinopril Swelling and Other (See Comments)    Extreme facial swelling causing hospitalization   Shellfish Allergy Anaphylaxis   Zetia [Ezetimibe] Other (See Comments)    ABDOMINAL CRAMPING   Dulaglutide Other (See Comments)   Gabapentin     Other reaction(s): Dizziness   Gabapentin (Once-Daily) Other (See Comments)   Iodine    Penicillins Other (See Comments)    UNSPECIFIED REACTION > Childhood allergy     Cymbalta [Duloxetine Hcl] Other (See Comments)    Severe feet cramps     Review of Systems:  No headache, visual changes, nausea, vomiting, diarrhea, constipation, dizziness, abdominal pain, skin rash, fevers, chills, night sweats, weight loss, swollen lymph nodes, body aches, joint swelling, chest pain, shortness of breath, mood changes. POSITIVE muscle aches  Objective  Blood pressure 102/66, pulse 66, height '5\' 10"'$  (1.778 m), weight 222 lb (100.7 kg), SpO2 97 %.   General: No apparent distress alert and oriented x3 mood and affect normal, dressed appropriately.  HEENT: Pupils equal, extraocular movements intact  Respiratory: Patient's speak in full sentences and does not appear short of breath  Cardiovascular: No lower extremity edema, non tender, no erythema  Neuro: Cranial nerves II through XII are intact, neurovascularly intact in all extremities with 2+ DTRs and 2+ pulses.  Gait normal with good balance and coordination.  MSK:  Non tender with full range of motion and good stability and symmetric strength and tone of shoulders, elbows, wrist,  hip, knee and ankles bilaterally.  Low back exam does show some mild tightness noted in the thoracolumbar junction.  He does have some mild tightness with FABER test as well.  Negative radicular symptoms with straight leg test.  Osteopathic findings  T9 extended rotated and side bent left L2 flexed rotated and side bent right Sacrum right on right       Assessment and Plan: Iron deficiency anemia secondary to  inadequate dietary iron intake Encourage patient to continue to take the supplementation.  To help with some of the pain.  Low back pain Patient is responding to manipulation.  Discussed we will continue this.  I do feel that there is some underlying anxiety that could be contributing.  Having difficulty with sleep and started on a low-dose of hydroxyzine.  Has had a history of gout previously of anxiety and depression and will need to continue to monitor.  Follow-up with me again in 4 to 6 weeks    Nonallopathic problems  Decision today to treat with OMT was based on Physical Exam  After verbal consent patient was treated with HVLA, ME, FPR techniques in  thoracic, lumbar, and sacral  areas  Patient tolerated the procedure well with improvement in symptoms  Patient given exercises, stretches and lifestyle modifications  See medications in patient instructions if given  Patient will follow up in 4-8 weeks     The above documentation has been reviewed and is accurate and complete [name], DO        Note: This dictation was prepared with Dragon dictation along with smaller phrase technology. Any transcriptional errors that result from this process are unintentional.

## 2021-02-03 ENCOUNTER — Other Ambulatory Visit (HOSPITAL_COMMUNITY): Payer: Self-pay

## 2021-02-04 ENCOUNTER — Ambulatory Visit (INDEPENDENT_AMBULATORY_CARE_PROVIDER_SITE_OTHER): Payer: 59 | Admitting: Family Medicine

## 2021-02-04 ENCOUNTER — Other Ambulatory Visit: Payer: Self-pay

## 2021-02-04 ENCOUNTER — Encounter: Payer: Self-pay | Admitting: Family Medicine

## 2021-02-04 ENCOUNTER — Other Ambulatory Visit (HOSPITAL_COMMUNITY): Payer: Self-pay

## 2021-02-04 VITALS — BP 102/66 | HR 66 | Ht 70.0 in | Wt 222.0 lb

## 2021-02-04 DIAGNOSIS — M545 Low back pain, unspecified: Secondary | ICD-10-CM

## 2021-02-04 DIAGNOSIS — M9902 Segmental and somatic dysfunction of thoracic region: Secondary | ICD-10-CM | POA: Diagnosis not present

## 2021-02-04 DIAGNOSIS — D508 Other iron deficiency anemias: Secondary | ICD-10-CM

## 2021-02-04 DIAGNOSIS — M9904 Segmental and somatic dysfunction of sacral region: Secondary | ICD-10-CM

## 2021-02-04 DIAGNOSIS — M9903 Segmental and somatic dysfunction of lumbar region: Secondary | ICD-10-CM | POA: Diagnosis not present

## 2021-02-04 DIAGNOSIS — G8929 Other chronic pain: Secondary | ICD-10-CM

## 2021-02-04 MED ORDER — HYDROXYZINE HCL 10 MG PO TABS
10.0000 mg | ORAL_TABLET | Freq: Every evening | ORAL | 0 refills | Status: DC | PRN
  Filled 2021-02-04: qty 90, 90d supply, fill #0

## 2021-02-04 NOTE — Assessment & Plan Note (Signed)
Encourage patient to continue to take the supplementation.  To help with some of the pain.

## 2021-02-04 NOTE — Patient Instructions (Addendum)
Take Hydroxyzine nightly, but can take up to 3 times a day if needed Continue to do exercises Take time for yourself See you again in 5-6 weeks

## 2021-02-04 NOTE — Assessment & Plan Note (Signed)
Patient is responding to manipulation.  Discussed we will continue this.  I do feel that there is some underlying anxiety that could be contributing.  Having difficulty with sleep and started on a low-dose of hydroxyzine.  Has had a history of gout previously of anxiety and depression and will need to continue to monitor.  Follow-up with me again in 4 to 6 weeks

## 2021-03-04 ENCOUNTER — Other Ambulatory Visit: Payer: Self-pay | Admitting: Internal Medicine

## 2021-03-04 ENCOUNTER — Other Ambulatory Visit (HOSPITAL_COMMUNITY): Payer: Self-pay

## 2021-03-04 DIAGNOSIS — E1065 Type 1 diabetes mellitus with hyperglycemia: Secondary | ICD-10-CM

## 2021-03-04 DIAGNOSIS — F900 Attention-deficit hyperactivity disorder, predominantly inattentive type: Secondary | ICD-10-CM

## 2021-03-04 MED FILL — Glucagon Nasal Powder 3 MG/DOSE: NASAL | 30 days supply | Qty: 1 | Fill #0 | Status: AC

## 2021-03-04 MED FILL — Glucose Blood Test Strip: 30 days supply | Qty: 200 | Fill #1 | Status: AC

## 2021-03-05 ENCOUNTER — Other Ambulatory Visit (HOSPITAL_COMMUNITY): Payer: Self-pay

## 2021-03-05 MED ORDER — METFORMIN HCL 500 MG PO TABS
1000.0000 mg | ORAL_TABLET | Freq: Two times a day (BID) | ORAL | 0 refills | Status: DC
  Filled 2021-03-05: qty 360, 90d supply, fill #0

## 2021-03-08 MED ORDER — AMPHETAMINE-DEXTROAMPHET ER 20 MG PO CP24
ORAL_CAPSULE | ORAL | 0 refills | Status: DC
  Filled 2021-03-08: qty 30, 30d supply, fill #0

## 2021-03-09 ENCOUNTER — Other Ambulatory Visit (HOSPITAL_COMMUNITY): Payer: Self-pay

## 2021-03-10 ENCOUNTER — Other Ambulatory Visit (HOSPITAL_COMMUNITY): Payer: Self-pay

## 2021-03-12 ENCOUNTER — Telehealth: Payer: Self-pay | Admitting: Pharmacy Technician

## 2021-03-12 ENCOUNTER — Other Ambulatory Visit (HOSPITAL_COMMUNITY): Payer: Self-pay

## 2021-03-12 NOTE — Telephone Encounter (Signed)
Patient Advocate Encounter   Received notification from CoverMyMeds that prior authorization for Baqsimi is required.   PA submitted on 03/12/21 Key [id number] Status is pending    [facility] will continue to follow:   Armanda Magic, CPhT Patient [facility] Phone: [phone] Fax:  [phone]

## 2021-03-16 ENCOUNTER — Other Ambulatory Visit (HOSPITAL_COMMUNITY): Payer: Self-pay

## 2021-03-18 ENCOUNTER — Other Ambulatory Visit (HOSPITAL_COMMUNITY): Payer: Self-pay

## 2021-03-19 ENCOUNTER — Ambulatory Visit: Payer: 59 | Admitting: Family Medicine

## 2021-03-19 ENCOUNTER — Other Ambulatory Visit (HOSPITAL_COMMUNITY): Payer: Self-pay

## 2021-03-19 NOTE — Telephone Encounter (Signed)
Signed form given to [place].

## 2021-03-19 NOTE — Telephone Encounter (Signed)
Received [employer] PA form.  Filled out and emailed to Dr.Gherghe to sign and fax back in to [place]

## 2021-03-19 NOTE — Telephone Encounter (Signed)
Form faxed to [employer] at [phone]

## 2021-03-24 DIAGNOSIS — Z760 Encounter for issue of repeat prescription: Secondary | ICD-10-CM | POA: Diagnosis not present

## 2021-03-27 ENCOUNTER — Encounter: Payer: Self-pay | Admitting: Internal Medicine

## 2021-03-27 ENCOUNTER — Other Ambulatory Visit: Payer: Self-pay

## 2021-03-27 ENCOUNTER — Ambulatory Visit: Payer: 59 | Admitting: Internal Medicine

## 2021-03-27 VITALS — BP 120/72 | HR 76 | Ht 70.0 in | Wt 232.0 lb

## 2021-03-27 DIAGNOSIS — E1065 Type 1 diabetes mellitus with hyperglycemia: Secondary | ICD-10-CM

## 2021-03-27 DIAGNOSIS — E785 Hyperlipidemia, unspecified: Secondary | ICD-10-CM | POA: Diagnosis not present

## 2021-03-27 NOTE — Patient Instructions (Addendum)
Please use the following pump settings: - Basal rates: 12 AM: 1.15 3 AM: 1.10 4 AM: 1.20  6:30 AM: 1.40 >> 1.6 8 AM: 1.90 10 AM: 1.65 12 PM: 1.45 4 PM: 1.20 9 PM: 1.55 10:30 PM: 1.30 >> 1.45 - bolus:  - Insulin to carb ratio: 1:4 except 3 pm to 12 am: ICR 1:3.5 - Insulin sensitivity factor:  25 - target: 100-120 - Active insulin time: 3 hours  Please continue: - Metformin 1000 mg 2x with meals  - Actos 22.5 mg daily in a.m.  Please come back for a follow-up appointment in 3-4 months.

## 2021-03-27 NOTE — Progress Notes (Signed)
Patient ID: Zachary Graham, male   DOB: 1961-01-25, 60 y.o.   MRN: [id number]   This visit occurred during the SARS-CoV-2 public health emergency.  Safety protocols were in place, including screening questions prior to the visit, additional usage of staff PPE, and extensive cleaning of exam room while observing appropriate contact time as indicated for disinfecting solutions.   HPI: Zachary Graham is a 60 y.o.-year-old male, presenting for f/u for DM1, dx 1993, insulin-dependent, uncontrolled, with complications (mild sensory peripheral neuropathy; hypoglycemia episodes, DR). Last visit 4 months ago.  Interim history: Initially, after switching insulin in his pump from Humalog to Lyumjev, he lost 15 pounds.  However, he did gain weight afterwards. Before last visit he started a DHEA supplement and he feels better on this but his sugars were worse so he stopped since. No increased urination, blurry vision, nausea, chest pain. He started iron tx 3 weeks ago.  He does have iron deficiency anemia with an unknown etiology.  Previous colonoscopy was normal. He continues to have long hours at work and significantly increased stress and burnout.  He comes home after 9 PM and he is eating denies late.  Insulin pump: - Started in 2006 - He is on Medtronic 670 G pump  CGM: -Libre CGM as a backup -now using a Dexcom G6 CGM  Insulin: - Prev. Humalog >> Lyumjev  (50 vials for 3 months)  Reviewed HbA1c levels: Lab Results  Component Value Date   HGBA1C 8.0 12/27/2020   HGBA1C 7.6 (A) 12/05/2020   HGBA1C 8.3 (H) 10/28/2020   HGBA1C 7.2 (A) 07/24/2020   HGBA1C 7.2 (A) 03/26/2020   HGBA1C 7.3 (A) 11/20/2019   HGBA1C 7.4 (A) 08/02/2019   HGBA1C 7.4 (A) 04/06/2019   HGBA1C 7.6 (A) 12/08/2018   HGBA1C 8.2 (H) 10/19/2018   HGBA1C 7.7 (A) 05/02/2018   HGBA1C 7.3 (A) 01/05/2018   HGBA1C 8.1 (A) 10/03/2017   HGBA1C 7.6 03/10/2017   HGBA1C 9.7 (H) 12/02/2016   HGBA1C 7.3 04/22/2016   HGBA1C 7.8  02/17/2016   HGBA1C 8.0 11/13/2015   HGBA1C 7.8 (H) 10/14/2015   HGBA1C 8.1 05/30/2015  10/2019: HbA1c calculated from fructosamine: 6.3%  Pump settings: - Basal rates: 12 AM: 1.10 >> 1.15 3 AM: 1.05 >> 1.10 4 AM: 1.20 6:30 AM: 1.40 8 AM: 1.80 >> 1.90 10 AM: 1.65 12 PM: 1.45 4 PM: 1.20 8 PM: 1.45 9 PM: 1.55 10:30 PM: 1.30  - bolus:  - Insulin to carb ratio:  12 AM: 4 11 AM: 4 5 PM:  4 - Insulin sensitivity factor:  25 - target: 100-120 - Active insulin time: 3 hours  TDD basal: 39% >> 36% TDD bolus: 61% >> 64% TDD 122 units >> 210-240  >> 90-110 units a day - Bolus wizard: On - Changes the pump site:  every 1-2 >> now 2-3 days   He is also on:  - Metformin 500 >> 1000 mg twice a day - Actos 45 mg daily-started 12/2019 >> decreased to 22.5 mg in 65/9935 We tried Trulicy in 70/1779 but he stopped in 02/2019 due to nausea and abdominal pain.  He checks his sugars more than 4 times a day with his Dexcom CGM:   Previously:   Previously:   Lowest: 50 >> 70s Highest: 300 >> 300. He has a history of a severe hypoglycemic episode in 2016.  He has a glucagon kit at home (refilled by the [place]). No previous DKA admissions.  -No CKD, last BUN/creatinine:  Lab Results  Component Value Date   BUN 10 12/27/2020   CREATININE 0.8 12/27/2020  04/11/2018: 10/0.778, ACR 11.1 On Cozaar.  -+ HL; last set of lipids: Lab Results  Component Value Date   CHOL 98 12/27/2020   HDL 52 12/27/2020   LDLCALC 45 10/28/2020   LDLDIRECT 87.0 12/02/2016   TRIG 74 12/27/2020   CHOLHDL 3 10/28/2020  06/23/2020: 79/74/43/21 On high-dose Lipitor.  - last eye exam was in 12/2019: No DR, previously + Mild NPDR. [facility].   -+ Numbness and tingling in his feet.  This has improved on the B complex. B12 was normal, 855, on 01/28/2021.  He also has a history of GERD, HTN, OSA.  Latest TSH was normal: Lab Results  Component Value Date   TSH 2.48 12/27/2020  04/11/2018: TSH  3.02  Vitamin D levels have been normal: Lab Results  Component Value Date   VD25OH 74.87 10/28/2020   VD25OH 59.85 10/23/2019   VD25OH 68.22 10/19/2018   VD25OH 83.45 10/03/2017   VD25OH 41.95 06/06/2015   VD25OH 35.03 07/17/2014   VD25OH 44 03/01/2013  04/11/2018: vitamin D 46.7.   Previously on ergocalciferol.  He has adhesive capsulitis >> improved.  He is getting occasional steroid injections but we discussed to reduce these as much as possible.  On meloxicam.  ROS: + see HPI Neurological: no tremors/+ numbness/+ tingling/no dizziness  I reviewed pt's medications, allergies, PMH, social hx, family hx, and changes were documented in the history of present illness. Otherwise, unchanged from my initial visit note.  Past Medical History:  Diagnosis Date   Diabetes mellitus    GERD (gastroesophageal reflux disease)    Heart murmur    Hyperlipidemia    Hypertension    OSA (obstructive sleep apnea)    Pneumonia    Past Surgical History:  Procedure Laterality Date   CERVICAL DISC ARTHROPLASTY N/A 03/16/2017   Procedure: CERVICAL FIVE- CERVICAL SIX Marquez ARTHROPLASTY;  Surgeon: Jovita Gamma, MD;  Location: [facility];  Service: Neurosurgery;  Laterality: N/A;  CERVICAL 5- CERVICAL 6 DISC ARTHROPLASTY   COLONOSCOPY     TONSILLECTOMY     Social History   Socioeconomic History   Marital status: Married    Spouse name: Not on file   Number of children: 3   Years of education: Not on file   Highest education level: Not on file  Occupational History   Occupation: RADIATION SAFETY OFFICER    Employer: [employer]  Tobacco Use   Smoking status: Never   Smokeless tobacco: Never  Vaping Use   Vaping Use: Never used  Substance and Sexual Activity   Alcohol use: No    Alcohol/week: 0.0 standard drinks   Drug use: No   Sexual activity: Yes    Partners: Female  Other Topics Concern   Not on file  Social History Narrative   Regular exercise: runs 3 miles 3x week, takes one  day off per week to rest   Caffeine use: stopped all diet soda on 04/23/15 per advice from Sports MD   Social Determinants of Health   Financial Resource Strain: Not on file  Food Insecurity: Not on file  Transportation Needs: Not on file  Physical Activity: Not on file  Stress: Not on file  Social Connections: Not on file  Intimate Partner Violence: Not on file   Current Outpatient Medications on File Prior to Visit  Medication Sig Dispense Refill   Alpha-Lipoic Acid 600 MG CAPS Take 1 capsule by  mouth daily.      amphetamine-dextroamphetamine (ADDERALL XR) 20 MG 24 hr capsule TAKE 1 CAPSULE BY MOUTH ONCE DAILY IN THE MORNING. 30 capsule 0   Ascorbic Acid (VITAMIN C) 1000 MG tablet Take 1,000 mg by mouth daily.     atorvastatin (LIPITOR) 80 MG tablet Take 1 tablet (80 mg total) by mouth daily. 90 tablet 1   azelastine (ASTELIN) 0.1 % nasal spray Place 2 sprays into both nostrils 2 (two) times daily. Use in each nostril as directed 90 mL 1   b complex vitamins tablet Take 1 tablet by mouth daily.     BAYER MICROLET LANCETS lancets USE TO TEST BLOOD SUGAR 6 TIMES A DAY 300 each 11   Beclomethasone Dipropionate (QNASL) 80 MCG/ACT AERS Place 2 puffs into the nose daily as needed (allergies). 10.6 g 5   Calcium-Magnesium-Vitamin D (CALCIUM 500 PO) Take 1 tablet by mouth daily.      Continuous Blood Gluc Receiver (FREESTYLE LIBRE 2 READER) DEVI 1 each by Does not apply route daily. 1 each 0   Continuous Blood Gluc Sensor (FREESTYLE LIBRE 2 SENSOR) MISC 1 each by Does not apply route every 14 (fourteen) days. 6 each 3   famotidine (PEPCID) 40 MG tablet Take 1 tablet by mouth at bedtime.     ferrous sulfate 325 (65 FE) MG tablet Take 1 tablet (325 mg total) by mouth daily with breakfast. 90 tablet 0   Glucagon 3 MG/DOSE POWD PLACE 3 MG INTO THE NOSE ONCE AS NEEDED FOR UP TO 1 DOSE. 1 each 11   glucose blood test strip USE AS INSTRUCTED TO CHECK SUGAR 6 TIMES DAILY (Patient taking differently:  USE AS INSTRUCTED TO CHECK SUGAR 6 TIMES DAILY) 400 strip 5   hydrOXYzine (ATARAX/VISTARIL) 10 MG tablet Take 1 tablet (10 mg total) by mouth at bedtime as needed. 90 tablet 0   Insulin Lispro-aabc 100 UNIT/ML SOLN USE UP TO 120 UNITS IN THE INSULIN PUMP DAILY 50 mL 3   Insulin Syringe-Needle U-100 31G X 5/16" 0.5 ML MISC USE 1 - 2 TIMES DAILY AS NEEDED FOR INSULIN INJECTION 30 each 5   levocetirizine (XYZAL) 5 MG tablet TAKE 1 TABLET BY MOUTH IN THE EVENING 90 tablet 1   losartan (COZAAR) 50 MG tablet TAKE 1 TABLET BY MOUTH DAILY. 90 tablet 1   Magnesium Oxide 400 MG CAPS Take 1 capsule (400 mg total) by mouth daily. 90 capsule 1   metFORMIN (GLUCOPHAGE) 500 MG tablet Take 2 tablets (1,000 mg total) by mouth 2 (two) times daily before a meal. 360 tablet 0   NON FORMULARY CPAP     omeprazole (PRILOSEC) 40 MG capsule TAKE ONE CAPSULE BY MOUTH TWICE A DAY BEFORE MEALS (TAKE ON AN EMPTY STOMACH 30 MINUTES PRIOR TO A MEAL)     pioglitazone (ACTOS) 45 MG tablet TAKE 1/2 TABLET BY MOUTH ONCE DAILY 45 tablet 2   traZODone (DESYREL) 50 MG tablet TAKE 1 TABLET BY MOUTH AT BEDTIME 90 tablet 1   Vitamin D, Ergocalciferol, (DRISDOL) 1.25 MG (50000 UNIT) CAPS capsule TAKE 1 CAPSULE BY MOUTH EVERY 7 DAYS 12 capsule 0   No current facility-administered medications on file prior to visit.   Allergies  Allergen Reactions   Lisinopril Swelling and Other (See Comments)    Extreme facial swelling causing hospitalization   Shellfish Allergy Anaphylaxis   Zetia [Ezetimibe] Other (See Comments)    ABDOMINAL CRAMPING   Dulaglutide Other (See Comments)   Gabapentin  Other reaction(s): Dizziness   Gabapentin (Once-Daily) Other (See Comments)   Iodine    Penicillins Other (See Comments)    UNSPECIFIED REACTION > Childhood allergy     Cymbalta [Duloxetine Hcl] Other (See Comments)    Severe feet cramps   Family History  Problem Relation Age of Onset   Colon cancer Maternal Grandfather    Colon cancer  Paternal Grandfather    Heart disease Father        Valve replacement; pacemaker   Alcohol abuse Neg Hx    Asthma Neg Hx    Diabetes Neg Hx    Hyperlipidemia Neg Hx    Hypertension Neg Hx    Kidney disease Neg Hx    Stroke Neg Hx    PE: There were no vitals taken for this visit. There is no height or weight on file to calculate BMI.  Wt Readings from Last 3 Encounters:  02/04/21 222 lb (100.7 kg)  01/28/21 223 lb (101.2 kg)  12/05/20 221 lb 6.4 oz (100.4 kg)   Constitutional: overweight, in NAD Eyes: PERRLA, EOMI, no exophthalmos ENT: moist mucous membranes, no thyromegaly, no cervical lymphadenopathy Cardiovascular: RRR, No MRG Respiratory: CTA B Gastrointestinal: abdomen soft, NT, ND, BS+ Musculoskeletal: no deformities, strength intact in all 4 Skin: moist, warm, no rashes Neurological: no tremor with outstretched hands, DTR normal in all 4  ASSESSMENT: 1. DM1, insulin-dependent, uncontrolled, without complications - DR - PN  - Barriers to good control: Very busy schedule >> less time to check sugars and eat but But he does a great job with bolusing during the day >> still has a lot of large boluses during the day, sometimes without documented carbs or sugars Lack of sleep >> usually sleeps max 5h a night (!) and tries to catch up in the weekend Reaches home late at night  - sometimes at 1 am >> eats >65% of the daily calories then  2. HL  PLAN:  1. Patient with longstanding, uncontrolled, type 1 diabetes, with high insulin resistance, managed on an insulin pump but with improved control after adding Actos 45 mg daily.  Due to concern for side effects, we decreased the dose of Actos to half a tablet in 2021 and he continues on 22.5 mg daily now.  His insulin sensitivity improved after switching from Humalog to Lyumjev and he lost weight.  However, at last visit, sugars were higher than before without consistent patterns.  Upon questioning, he noticed an increase in blood  sugars after starting the DHEA supplement >> we discussed about stopping it.  We also discussed about bolusing before every meal and entering enough carbs during the day.  We again discussed about obtaining a pump that integrated with a sensor in the closed-loop technology and I advised him to discuss with the [place] and his insurance to see which device would be covered. Discussed about advantages and disadvantages of each system: - Medtronic + Guardian - Omnipod + Dexcom G7 - t:slim + Dexcom G7 At this visit, he tells me that he did receive a new pump from the [place] but he is not quite sure which one as he did not have time to open the package and start it.  He thinks this was an OmniPod.  He is not sure whether it was the [employer] or the OmniPod 5.  Discussed about the differences. CGM interpretation: -At today's visit, we reviewed his CGM downloads: It appears that 51.22% of values are in target range (goal >70%),  while 48.3% are higher than 180 (goal <25%), and 0.5% are lower than 70 (goal <4%).  The calculated average blood sugar is 187.  The projected HbA1c for the next 3 months (GMI) is 7.8%. -Reviewing the CGM trends, it appears that his sugars are higher than before, increasing especially in the morning, after approximately 6 AM.  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  He does not have coffee or breakfast before this.  In this case, I advised him to increase her basal rate from 630 to 8 AM.   -Blood sugars are fairly well controlled around lunchtime but they do increase after 4 PM and upon questioning this is when he has a Nutrisystem snack.  Discussed 28 to 30 g of carbs.  Sugars remain high afterwards including after dinner, which is quite late.  They then starts decreasing after 2 AM.  At this visit I advised him to increase the basal rate between 10:30 PM and midnight and also strengthened his insulin to carb ratio after 3 PM for his afternoon snack and also dinner  -We did discuss  this after he attaches the pump, he will need to get in the closed-loop system (automatic mode) which will most likely further help.  I advised him to let me know so I can refer him to our diabetes educator to start this.  We also discussed about the new iLet pump which would probably be a very good fit for him as it is able to adjust the boluses without carb input. - I advised him to:  Patient Instructions  Please use the following pump settings: - Basal rates: 12 AM: 1.15 3 AM: 1.10 4 AM: 1.20  6:30 AM: 1.40 >> 1.6 8 AM: 1.90 10 AM: 1.65 12 PM: 1.45 4 PM: 1.20 9 PM: 1.55 10:30 PM: 1.30 >> 1.45 - bolus:  - Insulin to carb ratio: 1:4 except 3 pm to 12 am: ICR 1:3.5 - Insulin sensitivity factor:  25 - target: 100-120 - Active insulin time: 3 hours  Please continue: - Metformin 1000 mg 2x with meals  - Actos 22.5 mg daily in a.m.  Please come back for a follow-up appointment in 3-4 months.  - we checked his HbA1c: 7.9% (slightly better) - advised to check sugars at different times of the day - 4x a day, rotating check times - advised for yearly eye exams >> he is due - return to clinic in 3-4 months  2. HL -Reviewed latest lipid panel from 12/2020: All fractions at goal: Lab Results  Component Value Date   CHOL 98 12/27/2020   HDL 52 12/27/2020   LDLCALC 45 10/28/2020   LDLDIRECT 87.0 12/02/2016   TRIG 74 12/27/2020   CHOLHDL 3 10/28/2020  -Continues Lipitor 80 mg daily without side effects  He is up-to-date with flu shot.  Total time spent for the visit: 40 min, in reviewing his pump downloads, discussing his hypo- and hyper-glycemic episodes, reviewing previous labs and pump settings and developing a plan to avoid hypo- and hyper-glycemia.  Also, please see discussed topics above.    Philemon Kingdom, MD PhD [facility]

## 2021-03-31 ENCOUNTER — Other Ambulatory Visit (HOSPITAL_COMMUNITY): Payer: Self-pay

## 2021-04-02 ENCOUNTER — Other Ambulatory Visit (HOSPITAL_COMMUNITY): Payer: Self-pay

## 2021-04-02 NOTE — Telephone Encounter (Signed)
Patient Advocate Encounter  Received a fax from [facility] for more info. PA ref. # [id number]  Answered question and faxed recent office notes as well.   Preferred meds are glucagon emergency kit, Gvoke, and Zegalogue.

## 2021-04-03 ENCOUNTER — Other Ambulatory Visit (HOSPITAL_COMMUNITY): Payer: Self-pay

## 2021-04-03 LAB — HM DIABETES EYE EXAM

## 2021-04-06 ENCOUNTER — Other Ambulatory Visit (HOSPITAL_COMMUNITY): Payer: Self-pay

## 2021-04-06 LAB — HM DIABETES EYE EXAM

## 2021-04-06 NOTE — Telephone Encounter (Signed)
Patient Advocate Encounter  Prior Authorization for Baqsimi has been approved.    PA#  PA Case ID: [id number] Effective dates: 04/02/21 through 04/01/22  Per Test Claim Patients co-pay is $0.   Pharmacy Already processed  Patient Advocate Fax:  [phone]

## 2021-04-13 ENCOUNTER — Other Ambulatory Visit: Payer: Self-pay | Admitting: Internal Medicine

## 2021-04-13 ENCOUNTER — Other Ambulatory Visit (HOSPITAL_COMMUNITY): Payer: Self-pay

## 2021-04-13 DIAGNOSIS — F900 Attention-deficit hyperactivity disorder, predominantly inattentive type: Secondary | ICD-10-CM

## 2021-04-14 ENCOUNTER — Other Ambulatory Visit (HOSPITAL_COMMUNITY): Payer: Self-pay

## 2021-04-14 MED ORDER — AMPHETAMINE-DEXTROAMPHET ER 20 MG PO CP24
ORAL_CAPSULE | ORAL | 0 refills | Status: DC
Start: 2021-04-14 — End: 2021-05-08
  Filled 2021-04-14: qty 30, 30d supply, fill #0

## 2021-04-15 ENCOUNTER — Other Ambulatory Visit (HOSPITAL_COMMUNITY): Payer: Self-pay

## 2021-04-22 NOTE — Progress Notes (Signed)
[place] Phone: [phone] Subjective:   Zachary Graham, am serving as a scribe for Dr. Hulan Saas.  This visit occurred during the SARS-CoV-2 public health emergency.  Safety protocols were in place, including screening questions prior to the visit, additional usage of staff PPE, and extensive cleaning of exam room while observing appropriate contact time as indicated for disinfecting solutions.   I'm seeing this patient by the request  of:  [name], MD  CC: Low back pain follow-up  [id number]  Zachary Graham is a 60 y.o. male coming in with complaint of back and neck pain. OMT on 02/04/2021. Patient states that he is Graham longer having knee pain. Might need to get new mattress as his back pain increases after laying down for 5-6 hours. Did sleep in son's bed this past weekend and had same symptoms. Pain decreases after he gets up.   Also complains of medial thigh pain in L leg. Pain occurring after he has been sitting for prolonged periods. Worried that he has a blood clot as his friend has a blood clot. Pain is intermittent.   Using iron 65mg  and feels energy increase is 10% more. Feels that fatigue is coming from working too many hours.   Medications patient has been prescribed: hydroxyzine  Taking:         Reviewed prior external information including notes and imaging from previsou exam, outside providers and external EMR if available.   As well as notes that were available from care everywhere and other healthcare systems.  Past medical history, social, surgical and family history all reviewed in electronic medical record.  Graham pertanent information unless stated regarding to the chief complaint.   Past Medical History:  Diagnosis Date   Diabetes mellitus    GERD (gastroesophageal reflux disease)    Heart murmur    Hyperlipidemia    Hypertension    OSA (obstructive sleep apnea)     Pneumonia     Allergies  Allergen Reactions   Lisinopril Swelling and Other (See Comments)    Extreme facial swelling causing hospitalization   Shellfish Allergy Anaphylaxis   Zetia [Ezetimibe] Other (See Comments)    ABDOMINAL CRAMPING   Dulaglutide Other (See Comments)   Gabapentin     Other reaction(s): Dizziness   Gabapentin (Once-Daily) Other (See Comments)   Iodine    Penicillins Other (See Comments)    UNSPECIFIED REACTION > Childhood allergy     Cymbalta [Duloxetine Hcl] Other (See Comments)    Severe feet cramps     Review of Systems:  Graham headache, visual changes, nausea, vomiting, diarrhea, constipation, dizziness, abdominal pain, skin rash, fevers, chills, night sweats, weight loss, swollen lymph nodes, body aches, joint swelling, chest pain, shortness of breath, mood changes. POSITIVE muscle aches  Objective  Blood pressure (!) 142/62, pulse (!) 56, height 5\' 10"  (1.778 m), weight 231 lb (104.8 kg), SpO2 98 %.   General: Graham apparent distress alert and oriented x3 mood and affect normal, dressed appropriately.  HEENT: Pupils equal, extraocular movements intact  Respiratory: Patient's speak in full sentences and does not appear short of breath  Cardiovascular: Graham lower extremity edema, non tender, Graham erythema  Neuro: Cranial nerves II through XII are intact, neurovascularly intact in all extremities with 2+ DTRs and 2+ pulses.  Gait normal with good balance and coordination.  MSK:  Non tender with full range of motion and good stability and  symmetric strength and tone of shoulders, elbows, wrist, hip, knee and ankles bilaterally.  Back back exam still has significant tightness noted.  Tenderness to palpation of the paraspinal musculature.  Tightness with FABER test bilaterally.  Osteopathic findings   T3 extended rotated and side bent right inhaled rib T8 extended rotated and side bent left L2 flexed rotated and side bent right Sacrum right on right        Assessment and Plan:  Low back pain Chronic problem with infection trying to be more active.  Discussed more of the lower impact exercises I feel.  Avoided any type of osteopathic manipulation of the neck secondary to history of the surgery.  We will get x-rays of the lumbar spine and the pelvis to make sure there is Graham other bony abnormality that could be contributing.  Follow-up with me again in 4 to 8 weeks   Nonallopathic problems  Decision today to treat with OMT was based on Physical Exam  After verbal consent patient was treated with HVLA, ME, FPR techniques in, rib, thoracic, lumbar, and sacral  areas  Patient tolerated the procedure well with improvement in symptoms  Patient given exercises, stretches and lifestyle modifications  See medications in patient instructions if given  Patient will follow up in 4-8 weeks      The above documentation has been reviewed and is accurate and complete Zachary Pulley, DO       Note: This dictation was prepared with Dragon dictation along with smaller phrase technology. Any transcriptional errors that result from this process are unintentional.

## 2021-04-23 ENCOUNTER — Encounter: Payer: Self-pay | Admitting: Family Medicine

## 2021-04-23 ENCOUNTER — Other Ambulatory Visit: Payer: Self-pay

## 2021-04-23 ENCOUNTER — Ambulatory Visit (INDEPENDENT_AMBULATORY_CARE_PROVIDER_SITE_OTHER): Payer: 59

## 2021-04-23 ENCOUNTER — Ambulatory Visit (INDEPENDENT_AMBULATORY_CARE_PROVIDER_SITE_OTHER): Payer: 59 | Admitting: Family Medicine

## 2021-04-23 VITALS — BP 142/62 | HR 56 | Ht 70.0 in | Wt 231.0 lb

## 2021-04-23 DIAGNOSIS — M25552 Pain in left hip: Secondary | ICD-10-CM

## 2021-04-23 DIAGNOSIS — M545 Low back pain, unspecified: Secondary | ICD-10-CM | POA: Diagnosis not present

## 2021-04-23 DIAGNOSIS — M9904 Segmental and somatic dysfunction of sacral region: Secondary | ICD-10-CM

## 2021-04-23 DIAGNOSIS — M9902 Segmental and somatic dysfunction of thoracic region: Secondary | ICD-10-CM | POA: Diagnosis not present

## 2021-04-23 DIAGNOSIS — R102 Pelvic and perineal pain: Secondary | ICD-10-CM | POA: Diagnosis not present

## 2021-04-23 DIAGNOSIS — M9903 Segmental and somatic dysfunction of lumbar region: Secondary | ICD-10-CM | POA: Diagnosis not present

## 2021-04-23 DIAGNOSIS — G8929 Other chronic pain: Secondary | ICD-10-CM

## 2021-04-23 DIAGNOSIS — M25551 Pain in right hip: Secondary | ICD-10-CM

## 2021-04-23 NOTE — Patient Instructions (Addendum)
Xray today See me in 6 weeks [email or web address] 645 workout

## 2021-04-26 NOTE — Assessment & Plan Note (Signed)
Chronic problem with infection trying to be more active.  Discussed more of the lower impact exercises I feel.  Avoided any type of osteopathic manipulation of the neck secondary to history of the surgery.  We will get x-rays of the lumbar spine and the pelvis to make sure there is no other bony abnormality that could be contributing.  Follow-up with me again in 4 to 8 weeks

## 2021-04-27 ENCOUNTER — Other Ambulatory Visit: Payer: Self-pay | Admitting: Internal Medicine

## 2021-04-27 ENCOUNTER — Other Ambulatory Visit: Payer: Self-pay | Admitting: Family Medicine

## 2021-04-27 ENCOUNTER — Other Ambulatory Visit (HOSPITAL_COMMUNITY): Payer: Self-pay

## 2021-04-27 DIAGNOSIS — E1065 Type 1 diabetes mellitus with hyperglycemia: Secondary | ICD-10-CM

## 2021-04-27 DIAGNOSIS — F320 Major depressive disorder, single episode, mild: Secondary | ICD-10-CM

## 2021-04-27 DIAGNOSIS — E139 Other specified diabetes mellitus without complications: Secondary | ICD-10-CM

## 2021-04-27 DIAGNOSIS — I10 Essential (primary) hypertension: Secondary | ICD-10-CM

## 2021-04-27 DIAGNOSIS — E785 Hyperlipidemia, unspecified: Secondary | ICD-10-CM

## 2021-04-27 MED ORDER — ATORVASTATIN CALCIUM 80 MG PO TABS
80.0000 mg | ORAL_TABLET | Freq: Every day | ORAL | 1 refills | Status: DC
  Filled 2021-04-27: qty 90, 90d supply, fill #0

## 2021-04-27 MED ORDER — VITAMIN D (ERGOCALCIFEROL) 1.25 MG (50000 UNIT) PO CAPS
ORAL_CAPSULE | ORAL | 0 refills | Status: DC
  Filled 2021-04-27: qty 12, 84d supply, fill #0

## 2021-04-27 MED ORDER — LOSARTAN POTASSIUM 50 MG PO TABS
ORAL_TABLET | Freq: Every day | ORAL | 1 refills | Status: DC
  Filled 2021-04-27: qty 90, 90d supply, fill #0
  Filled 2021-08-03: qty 90, 90d supply, fill #1

## 2021-04-27 MED ORDER — TRAZODONE HCL 50 MG PO TABS
ORAL_TABLET | Freq: Every day | ORAL | 1 refills | Status: DC
Start: 2021-04-27 — End: 2021-09-01
  Filled 2021-04-27: qty 90, 90d supply, fill #0
  Filled 2021-08-03: qty 90, 90d supply, fill #1

## 2021-04-27 MED FILL — Insulin Lispro-aabc Inj 100 Unit/ML: INTRAMUSCULAR | 41 days supply | Qty: 50 | Fill #2 | Status: AC

## 2021-04-28 ENCOUNTER — Other Ambulatory Visit (HOSPITAL_COMMUNITY): Payer: Self-pay

## 2021-04-28 MED ORDER — HYDROXYZINE HCL 10 MG PO TABS
10.0000 mg | ORAL_TABLET | Freq: Every evening | ORAL | 0 refills | Status: DC | PRN
  Filled 2021-04-28: qty 90, 90d supply, fill #0

## 2021-04-29 ENCOUNTER — Encounter: Payer: Self-pay | Admitting: Internal Medicine

## 2021-04-30 ENCOUNTER — Encounter: Payer: Self-pay | Admitting: Internal Medicine

## 2021-05-08 ENCOUNTER — Other Ambulatory Visit: Payer: Self-pay | Admitting: Internal Medicine

## 2021-05-08 ENCOUNTER — Other Ambulatory Visit (HOSPITAL_COMMUNITY): Payer: Self-pay

## 2021-05-08 DIAGNOSIS — F900 Attention-deficit hyperactivity disorder, predominantly inattentive type: Secondary | ICD-10-CM

## 2021-05-08 MED ORDER — AMPHETAMINE-DEXTROAMPHET ER 20 MG PO CP24
ORAL_CAPSULE | ORAL | 0 refills | Status: DC
  Filled 2021-05-08: qty 30, fill #0
  Filled 2021-05-15: qty 30, 30d supply, fill #0

## 2021-05-08 MED FILL — Glucagon Nasal Powder 3 MG/DOSE: NASAL | 30 days supply | Qty: 1 | Fill #1 | Status: AC

## 2021-05-12 ENCOUNTER — Other Ambulatory Visit (HOSPITAL_COMMUNITY): Payer: Self-pay

## 2021-05-15 ENCOUNTER — Other Ambulatory Visit (HOSPITAL_COMMUNITY): Payer: Self-pay

## 2021-05-15 MED ORDER — CARESTART COVID-19 HOME TEST VI KIT
PACK | 0 refills | Status: DC
  Filled 2021-05-15: qty 4, 1d supply, fill #0

## 2021-05-29 DIAGNOSIS — D509 Iron deficiency anemia, unspecified: Secondary | ICD-10-CM | POA: Diagnosis not present

## 2021-05-29 DIAGNOSIS — K219 Gastro-esophageal reflux disease without esophagitis: Secondary | ICD-10-CM | POA: Diagnosis not present

## 2021-05-29 DIAGNOSIS — Z91013 Allergy to seafood: Secondary | ICD-10-CM | POA: Diagnosis not present

## 2021-05-29 DIAGNOSIS — J300 Vasomotor rhinitis: Secondary | ICD-10-CM | POA: Diagnosis not present

## 2021-05-29 DIAGNOSIS — Z862 Personal history of diseases of the blood and blood-forming organs and certain disorders involving the immune mechanism: Secondary | ICD-10-CM | POA: Diagnosis not present

## 2021-06-03 NOTE — Progress Notes (Deleted)
[place] Phone: [phone] Subjective:    I'm seeing this patient by the request  of:  [name], MD  CC:   [id number]  04/23/2021 Chronic problem with infection trying to be more active.  Discussed more of the lower impact exercises I feel.  Avoided any type of osteopathic manipulation of the neck secondary to history of the surgery.  We will get x-rays of the lumbar spine and the pelvis to make sure there is no other bony abnormality that could be contributing.  Follow-up with me again in 4 to 8 weeks  Updated 06/04/2021 DERRAL COLUCCI is a 61 y.o. male coming in with complaint of low back pain   Xray IMPRESSION: Mild bilateral degenerative hip arthritis.  Lumbar (-)     Past Medical History:  Diagnosis Date   Diabetes mellitus    GERD (gastroesophageal reflux disease)    Heart murmur    Hyperlipidemia    Hypertension    OSA (obstructive sleep apnea)    Pneumonia    Past Surgical History:  Procedure Laterality Date   CERVICAL DISC ARTHROPLASTY N/A 03/16/2017   Procedure: CERVICAL FIVE- CERVICAL SIX [place] ARTHROPLASTY;  Surgeon: Jovita Gamma, MD;  Location: [place];  Service: Neurosurgery;  Laterality: N/A;  CERVICAL 5- CERVICAL 6 DISC ARTHROPLASTY   COLONOSCOPY     TONSILLECTOMY     Social History   Socioeconomic History   Marital status: Married    Spouse name: Not on file   Number of children: 3   Years of education: Not on file   Highest education level: Not on file  Occupational History   Occupation: RADIATION SAFETY OFFICER    Employer: [employer]  Tobacco Use   Smoking status: Never   Smokeless tobacco: Never  Vaping Use   Vaping Use: Never used  Substance and Sexual Activity   Alcohol use: No    Alcohol/week: 0.0 standard drinks   Drug use: No   Sexual activity: Yes    Partners: Female  Other Topics Concern   Not on file  Social History Narrative   Regular  exercise: runs 3 miles 3x week, takes one day off per week to rest   Caffeine use: stopped all diet soda on 04/23/15 per advice from Sports MD   Social Determinants of Health   Financial Resource Strain: Not on file  Food Insecurity: Not on file  Transportation Needs: Not on file  Physical Activity: Not on file  Stress: Not on file  Social Connections: Not on file   Allergies  Allergen Reactions   Lisinopril Swelling and Other (See Comments)    Extreme facial swelling causing hospitalization   Shellfish Allergy Anaphylaxis   Zetia [Ezetimibe] Other (See Comments)    ABDOMINAL CRAMPING   Dulaglutide Other (See Comments)   Gabapentin     Other reaction(s): Dizziness   Gabapentin (Once-Daily) Other (See Comments)   Iodine    Penicillins Other (See Comments)    UNSPECIFIED REACTION > Childhood allergy     Cymbalta [Duloxetine Hcl] Other (See Comments)    Severe feet cramps   Family History  Problem Relation Age of Onset   Colon cancer Maternal Grandfather    Colon cancer Paternal Grandfather    Heart disease Father        Valve replacement; pacemaker   Alcohol abuse Neg Hx    Asthma Neg Hx    Diabetes Neg Hx  Hyperlipidemia Neg Hx    Hypertension Neg Hx    Kidney disease Neg Hx    Stroke Neg Hx     Current Outpatient Medications (Endocrine & Metabolic):    Glucagon 3 MG/DOSE POWD, PLACE 3 MG INTO THE NOSE ONCE AS NEEDED FOR UP TO 1 DOSE.   Insulin Lispro-aabc 100 UNIT/ML SOLN, USE UP TO 120 UNITS IN THE INSULIN PUMP DAILY   metFORMIN (GLUCOPHAGE) 500 MG tablet, Take 2 tablets (1,000 mg total) by mouth 2 (two) times daily before a meal.   pioglitazone (ACTOS) 45 MG tablet, TAKE 1/2 TABLET BY MOUTH ONCE DAILY  Current Outpatient Medications (Cardiovascular):    atorvastatin (LIPITOR) 80 MG tablet, Take 1 tablet (80 mg total) by mouth daily.   losartan (COZAAR) 50 MG tablet, TAKE 1 TABLET BY MOUTH DAILY.  Current Outpatient Medications (Respiratory):    azelastine  (ASTELIN) 0.1 % nasal spray, Place 2 sprays into both nostrils 2 (two) times daily. Use in each nostril as directed   Beclomethasone Dipropionate (QNASL) 80 MCG/ACT AERS, Place 2 puffs into the nose daily as needed (allergies).   levocetirizine (XYZAL) 5 MG tablet, TAKE 1 TABLET BY MOUTH IN THE EVENING   Current Outpatient Medications (Hematological):    ferrous sulfate 325 (65 FE) MG tablet, Take 1 tablet (325 mg total) by mouth daily with breakfast.  Current Outpatient Medications (Other):    Alpha-Lipoic Acid 600 MG CAPS, Take 1 capsule by mouth daily.    amphetamine-dextroamphetamine (ADDERALL XR) 20 MG 24 hr capsule, TAKE 1 CAPSULE BY MOUTH ONCE DAILY IN THE MORNING.   amphetamine-dextroamphetamine (ADDERALL XR) 20 MG 24 hr capsule, TAKE 1 CAPSULE BY MOUTH ONCE DAILY IN THE MORNING.   Ascorbic Acid (VITAMIN C) 1000 MG tablet, Take 1,000 mg by mouth daily.   b complex vitamins tablet, Take 1 tablet by mouth daily.   BAYER MICROLET LANCETS lancets, USE TO TEST BLOOD SUGAR 6 TIMES A DAY   Calcium-Magnesium-Vitamin D (CALCIUM 500 PO), Take 1 tablet by mouth daily.    Continuous Blood Gluc Receiver (FREESTYLE LIBRE 2 READER) DEVI, 1 each by Does not apply route daily.   Continuous Blood Gluc Sensor (FREESTYLE LIBRE 2 SENSOR) MISC, 1 each by Does not apply route every 14 (fourteen) days.   COVID-19 At Home Antigen Test [facility] COVID-19 HOME TEST) KIT, Use as directed.   famotidine (PEPCID) 40 MG tablet, Take 1 tablet by mouth at bedtime.   hydrOXYzine (ATARAX) 10 MG tablet, Take 1 tablet by mouth at bedtime as needed.   Magnesium Oxide 400 MG CAPS, Take 1 capsule (400 mg total) by mouth daily.   NON FORMULARY, CPAP   omeprazole (PRILOSEC) 40 MG capsule, TAKE ONE CAPSULE BY MOUTH TWICE A DAY BEFORE MEALS (TAKE ON AN EMPTY STOMACH 30 MINUTES PRIOR TO A MEAL)   traZODone (DESYREL) 50 MG tablet, TAKE 1 TABLET BY MOUTH AT BEDTIME   Vitamin D, Ergocalciferol, (DRISDOL) 1.25 MG (50000 UNIT) CAPS  capsule, TAKE 1 CAPSULE BY MOUTH EVERY 7 DAYS   Reviewed prior external information including notes and imaging from  primary care provider As well as notes that were available from care everywhere and other healthcare systems.  Past medical history, social, surgical and family history all reviewed in electronic medical record.  No pertanent information unless stated regarding to the chief complaint.   Review of Systems:  No headache, visual changes, nausea, vomiting, diarrhea, constipation, dizziness, abdominal pain, skin rash, fevers, chills, night sweats, weight loss, swollen lymph  nodes, body aches, joint swelling, chest pain, shortness of breath, mood changes. POSITIVE muscle aches  Objective  There were no vitals taken for this visit.   General: No apparent distress alert and oriented x3 mood and affect normal, dressed appropriately.  HEENT: Pupils equal, extraocular movements intact  Respiratory: Patient's speak in full sentences and does not appear short of breath  Cardiovascular: No lower extremity edema, non tender, no erythema  Gait normal with good balance and coordination.  MSK:    Osteopathic findings  T9 extended rotated and side bent left L2 flexed rotated and side bent right Sacrum right on right .     Impression and Recommendations:     The above documentation has been reviewed and is accurate and complete [name], DO

## 2021-06-04 ENCOUNTER — Encounter: Payer: Self-pay | Admitting: Family Medicine

## 2021-06-04 ENCOUNTER — Ambulatory Visit: Payer: 59 | Admitting: Family Medicine

## 2021-06-08 ENCOUNTER — Encounter: Payer: Self-pay | Admitting: Internal Medicine

## 2021-06-09 ENCOUNTER — Other Ambulatory Visit: Payer: Self-pay | Admitting: Internal Medicine

## 2021-06-09 DIAGNOSIS — F900 Attention-deficit hyperactivity disorder, predominantly inattentive type: Secondary | ICD-10-CM

## 2021-06-10 ENCOUNTER — Other Ambulatory Visit (HOSPITAL_COMMUNITY): Payer: Self-pay

## 2021-06-10 MED ORDER — AMPHETAMINE-DEXTROAMPHET ER 20 MG PO CP24
ORAL_CAPSULE | ORAL | 0 refills | Status: DC
  Filled 2021-06-10: qty 30, fill #0
  Filled 2021-06-12: qty 30, 30d supply, fill #0

## 2021-06-12 ENCOUNTER — Other Ambulatory Visit (HOSPITAL_COMMUNITY): Payer: Self-pay

## 2021-06-16 NOTE — Progress Notes (Signed)
[place] Phone: [phone] Subjective:   Zachary Graham, am serving as a scribe for Dr. Hulan Saas.This visit occurred during the SARS-CoV-2 public health emergency.  Safety protocols were in place, including screening questions prior to the visit, additional usage of staff PPE, and extensive cleaning of exam room while observing appropriate contact time as indicated for disinfecting solutions.  I'm seeing this patient by the request  of:  [name], MD  CC: Neck and back pain follow-up  [id number]  Zachary Graham is a 61 y.o. male coming in with complaint of back and neck pain. OMT on 04/23/2021. Patient states he has general questions. Not having any issues with his back since last visit. Knee pain has also improved other than random "twinges" of pain. Patient using a different chair which has helped.   Medications patient has been prescribed: Vit D and Hydroxyzine  Taking:yes         Reviewed prior external information including notes and imaging from previsou exam, outside providers and external EMR if available.   As well as notes that were available from care everywhere and other healthcare systems.  Past medical history, social, surgical and family history all reviewed in electronic medical record.  Graham pertanent information unless stated regarding to the chief complaint.   Past Medical History:  Diagnosis Date   Diabetes mellitus    GERD (gastroesophageal reflux disease)    Heart murmur    Hyperlipidemia    Hypertension    OSA (obstructive sleep apnea)    Pneumonia     Allergies  Allergen Reactions   Lisinopril Swelling and Other (See Comments)    Extreme facial swelling causing hospitalization   Shellfish Allergy Anaphylaxis   Zetia [Ezetimibe] Other (See Comments)    ABDOMINAL CRAMPING   Dulaglutide Other (See Comments)   Gabapentin     Other reaction(s): Dizziness    Gabapentin (Once-Daily) Other (See Comments)   Iodine    Penicillins Other (See Comments)    UNSPECIFIED REACTION > Childhood allergy     Cymbalta [Duloxetine Hcl] Other (See Comments)    Severe feet cramps     Review of Systems:  Graham headache, visual changes, nausea, vomiting, diarrhea, constipation, dizziness, abdominal pain, skin rash, fevers, chills, night sweats, weight loss, swollen lymph nodes, body aches, joint swelling, chest pain, shortness of breath, mood changes. POSITIVE muscle aches  Objective  Blood pressure 122/62, pulse (!) 45, height 5\' 10"  (1.778 m), weight 237 lb (107.5 kg), SpO2 98 %.   General: Graham apparent distress alert and oriented x3 mood and affect normal, dressed appropriately.  HEENT: Pupils equal, extraocular movements intact  Respiratory: Patient's speak in full sentences and does not appear short of breath  Cardiovascular: Graham lower extremity edema, non tender, Graham erythema  Back exam still has tightness noted in the paraspinal musculature.  Patient does have tightness cervical hip flexor bilaterally.  Negative straight leg test at the moment but still has tightness with the hamstrings.  Osteopathic findings   T3 extended rotated and side bent right inhaled rib T6 extended rotated and side bent left L2 flexed rotated and side bent right Sacrum right on right       Assessment and Plan:  Low back pain Chronic problem with mild exacerbation again.  Patient does have significant amount of stress.  Working more frequently and not taking care of himself.  We discussed with patient about potentially taking  a step back from work.  Patient will consider it.  Patient has trazodone to help avoid nighttime sleepiness and sometimes does seem to help the muscles.  Does not want anything else for pain at the moment.  Follow-up with me again in 4 to 6 weeks    Nonallopathic problems  Decision today to treat with OMT was based on Physical Exam  After verbal  consent patient was treated with HVLA, ME, FPR techniques in rib, thoracic, lumbar, and sacral  areas  Patient tolerated the procedure well with improvement in symptoms  Patient given exercises, stretches and lifestyle modifications  See medications in patient instructions if given  Patient will follow up in 4-8 weeks      The above documentation has been reviewed and is accurate and complete [name], DO        Note: This dictation was prepared with Dragon dictation along with smaller phrase technology. Any transcriptional errors that result from this process are unintentional.

## 2021-06-17 ENCOUNTER — Other Ambulatory Visit: Payer: Self-pay

## 2021-06-17 ENCOUNTER — Ambulatory Visit (INDEPENDENT_AMBULATORY_CARE_PROVIDER_SITE_OTHER): Payer: 59 | Admitting: Family Medicine

## 2021-06-17 VITALS — BP 122/62 | HR 45 | Ht 70.0 in | Wt 237.0 lb

## 2021-06-17 DIAGNOSIS — G8929 Other chronic pain: Secondary | ICD-10-CM

## 2021-06-17 DIAGNOSIS — M9902 Segmental and somatic dysfunction of thoracic region: Secondary | ICD-10-CM | POA: Diagnosis not present

## 2021-06-17 DIAGNOSIS — M9904 Segmental and somatic dysfunction of sacral region: Secondary | ICD-10-CM | POA: Diagnosis not present

## 2021-06-17 DIAGNOSIS — M545 Low back pain, unspecified: Secondary | ICD-10-CM

## 2021-06-17 DIAGNOSIS — M9903 Segmental and somatic dysfunction of lumbar region: Secondary | ICD-10-CM

## 2021-06-17 NOTE — Patient Instructions (Signed)
Good to see you Take care of yourself See me in 5 weeks

## 2021-06-18 ENCOUNTER — Encounter: Payer: Self-pay | Admitting: Family Medicine

## 2021-06-18 NOTE — Assessment & Plan Note (Signed)
Chronic problem with mild exacerbation again.  Patient does have significant amount of stress.  Working more frequently and not taking care of himself.  We discussed with patient about potentially taking a step back from work.  Patient will consider it.  Patient has trazodone to help avoid nighttime sleepiness and sometimes does seem to help the muscles.  Does not want anything else for pain at the moment.  Follow-up with me again in 4 to 6 weeks

## 2021-06-26 DIAGNOSIS — R0789 Other chest pain: Secondary | ICD-10-CM | POA: Diagnosis not present

## 2021-06-26 DIAGNOSIS — K219 Gastro-esophageal reflux disease without esophagitis: Secondary | ICD-10-CM | POA: Diagnosis not present

## 2021-06-26 DIAGNOSIS — Z7982 Long term (current) use of aspirin: Secondary | ICD-10-CM | POA: Diagnosis not present

## 2021-06-26 DIAGNOSIS — J310 Chronic rhinitis: Secondary | ICD-10-CM | POA: Diagnosis not present

## 2021-07-14 ENCOUNTER — Other Ambulatory Visit: Payer: Self-pay | Admitting: Family Medicine

## 2021-07-14 ENCOUNTER — Other Ambulatory Visit: Payer: Self-pay | Admitting: Internal Medicine

## 2021-07-14 ENCOUNTER — Other Ambulatory Visit (HOSPITAL_COMMUNITY): Payer: Self-pay

## 2021-07-14 DIAGNOSIS — F900 Attention-deficit hyperactivity disorder, predominantly inattentive type: Secondary | ICD-10-CM

## 2021-07-15 ENCOUNTER — Other Ambulatory Visit (HOSPITAL_COMMUNITY): Payer: Self-pay

## 2021-07-15 MED ORDER — AMPHETAMINE-DEXTROAMPHET ER 20 MG PO CP24
ORAL_CAPSULE | ORAL | 0 refills | Status: DC
  Filled 2021-07-15: qty 30, 30d supply, fill #0

## 2021-07-15 MED ORDER — VITAMIN D (ERGOCALCIFEROL) 1.25 MG (50000 UNIT) PO CAPS
ORAL_CAPSULE | ORAL | 0 refills | Status: DC
  Filled 2021-07-15: qty 12, 84d supply, fill #0

## 2021-07-15 MED ORDER — CONTOUR NEXT TEST VI STRP
ORAL_STRIP | 3 refills | Status: DC
  Filled 2021-07-15: qty 400, 67d supply, fill #0
  Filled 2021-09-24: qty 400, 67d supply, fill #1
  Filled 2021-12-24: qty 400, 67d supply, fill #2

## 2021-07-15 MED ORDER — LYUMJEV 100 UNIT/ML IJ SOLN
INTRAMUSCULAR | 1 refills | Status: DC
  Filled 2021-07-15: qty 50, 42d supply, fill #0
  Filled 2022-01-05: qty 50, 42d supply, fill #1

## 2021-07-16 ENCOUNTER — Other Ambulatory Visit (HOSPITAL_COMMUNITY): Payer: Self-pay

## 2021-07-17 ENCOUNTER — Encounter: Payer: Self-pay | Admitting: Internal Medicine

## 2021-07-22 NOTE — Progress Notes (Signed)
?Zachary Graham D.O. ?[facility] ?[place] ?Phone: [phone] ?Subjective:   ?I, Zachary Graham, am serving as a scribe for Dr. Hulan Saas. ? ?This visit occurred during the SARS-CoV-2 public health emergency.  Safety protocols were in place, including screening questions prior to the visit, additional usage of staff PPE, and extensive cleaning of exam room while observing appropriate contact time as indicated for disinfecting solutions.  ?I'm seeing this patient by the request  of:  [name], MD ? ?CC: Back and neck pain follow-up ? ?[id number]  ?Zachary Graham is a 61 y.o. male coming in with complaint of back and neck pain. OMT on 06/17/2021. Patient states that he has been doing ok. Has been tight in lower back due to lifting items in his basement.  ? ?Patient is going to have stress test next week due to some chest discomfort during a stressful time. ? ?Patient notes getting cramps in foot and legs after cutting the grass. Is taking magnesium but would like suggestions.  ? ?Medications patient has been prescribed: Vit D ? ?Taking: Yes ? ? ?  ? ? ? ? ?Reviewed prior external information including notes and imaging from previsou exam, outside providers and external EMR if available.  ? ?As well as notes that were available from care everywhere and other healthcare systems. ? ?Past medical history, social, surgical and family history all reviewed in electronic medical record.  No pertanent information unless stated regarding to the chief complaint.  ? ?Past Medical History:  ?Diagnosis Date  ? Diabetes mellitus   ? GERD (gastroesophageal reflux disease)   ? Heart murmur   ? Hyperlipidemia   ? Hypertension   ? OSA (obstructive sleep apnea)   ? Pneumonia   ?  ?Allergies  ?Allergen Reactions  ? Lisinopril Swelling and Other (See Comments)  ?  Extreme facial swelling causing hospitalization  ? Shellfish Allergy Anaphylaxis  ? Zetia [Ezetimibe] Other (See Comments)  ?   ABDOMINAL CRAMPING  ? Dulaglutide Other (See Comments)  ? Gabapentin   ?  Other reaction(s): Dizziness  ? Gabapentin (Once-Daily) Other (See Comments)  ? Iodine   ? Penicillins Other (See Comments)  ?  UNSPECIFIED REACTION > Childhood allergy  ?  ? Cymbalta [Duloxetine Hcl] Other (See Comments)  ?  Severe feet cramps  ? ? ? ?Review of Systems: ? No headache, visual changes, nausea, vomiting, diarrhea, constipation, dizziness, abdominal pain, skin rash, fevers, chills, night sweats, weight loss, swollen lymph nodes, body aches, joint swelling, chest pain, shortness of breath, mood changes. POSITIVE muscle aches ? ?Objective  ?Blood pressure 120/62, pulse 80, height '5\' 10"'$  (1.778 m), weight 240 lb (108.9 kg), SpO2 96 %. ?  ?General: No apparent distress alert and oriented x3 mood and affect normal, dressed appropriately.  ?HEENT: Pupils equal, extraocular movements intact  ?Respiratory: Patient's speak in full sentences and does not appear short of breath  ?Cardiovascular: No lower extremity edema, non tender, no erythema  ?Back exam still has tightness with FABER test bilaterally.  Patient does have limitation lacking the last 5 degrees of extension.  Tightness noted with straight leg test but no true radicular symptoms. ? ?Osteopathic findings ? ?T5 extended rotated and side bent left  inhaled rib ?T7 extended rotated and side bent left ?L2 flexed rotated and side bent right ?Sacrum right on right ? ? ? ? ?  ?Assessment and Plan: ? ?Low back pain ?Chronic problem with exacerbation.  Still responding  extremely well to osteopathic manipulation.  Does have some calf cramping that occurs occasionally.  Has had iron deficiency anemia previously as well as sleep apnea but seems to be well managed for both of these problems at the moment.  We discussed icing regimen and home exercises.  Follow-up with me again in 6 to 8 weeks discussed proper shoes when patient is doing other activities.  ? ?Nonallopathic  problems ? ?Decision today to treat with OMT was based on Physical Exam ? ?After verbal consent patient was treated with HVLA, ME, FPR techniques in  rib, thoracic, lumbar, and sacral  areas ? ?Patient tolerated the procedure well with improvement in symptoms ? ?Patient given exercises, stretches and lifestyle modifications ? ?See medications in patient instructions if given ? ?Patient will follow up in 4-8 weeks ? ?  ? ? ?The above documentation has been reviewed and is accurate and complete [name], DO ? ? ? ?  ? ? Note: This dictation was prepared with Dragon dictation along with smaller phrase technology. Any transcriptional errors that result from this process are unintentional.    ?  ?  ? ?

## 2021-07-23 ENCOUNTER — Ambulatory Visit (INDEPENDENT_AMBULATORY_CARE_PROVIDER_SITE_OTHER): Payer: 59 | Admitting: Family Medicine

## 2021-07-23 ENCOUNTER — Encounter: Payer: Self-pay | Admitting: Family Medicine

## 2021-07-23 ENCOUNTER — Other Ambulatory Visit: Payer: Self-pay

## 2021-07-23 VITALS — BP 120/62 | HR 80 | Ht 70.0 in | Wt 240.0 lb

## 2021-07-23 DIAGNOSIS — M545 Low back pain, unspecified: Secondary | ICD-10-CM

## 2021-07-23 DIAGNOSIS — M9904 Segmental and somatic dysfunction of sacral region: Secondary | ICD-10-CM

## 2021-07-23 DIAGNOSIS — M9903 Segmental and somatic dysfunction of lumbar region: Secondary | ICD-10-CM

## 2021-07-23 DIAGNOSIS — M9902 Segmental and somatic dysfunction of thoracic region: Secondary | ICD-10-CM

## 2021-07-23 DIAGNOSIS — G8929 Other chronic pain: Secondary | ICD-10-CM

## 2021-07-23 DIAGNOSIS — R252 Cramp and spasm: Secondary | ICD-10-CM | POA: Diagnosis not present

## 2021-07-23 NOTE — Assessment & Plan Note (Signed)
Chronic problem with exacerbation.  Still responding extremely well to osteopathic manipulation.  Does have some calf cramping that occurs occasionally.  Has had iron deficiency anemia previously as well as sleep apnea but seems to be well managed for both of these problems at the moment.  We discussed icing regimen and home exercises.  Follow-up with me again in 6 to 8 weeks discussed proper shoes when patient is doing other activities. ?

## 2021-07-23 NOTE — Patient Instructions (Addendum)
New lawn shoes ?HOKA recovery sandals in the house ?See me in 6-8 weeks ? ?

## 2021-07-24 ENCOUNTER — Encounter: Payer: Self-pay | Admitting: Internal Medicine

## 2021-07-24 ENCOUNTER — Ambulatory Visit: Payer: 59 | Admitting: Internal Medicine

## 2021-07-24 ENCOUNTER — Other Ambulatory Visit (HOSPITAL_COMMUNITY): Payer: Self-pay

## 2021-07-24 ENCOUNTER — Other Ambulatory Visit: Payer: Self-pay

## 2021-07-24 VITALS — BP 112/78 | HR 79 | Ht 70.0 in | Wt 237.8 lb

## 2021-07-24 DIAGNOSIS — E785 Hyperlipidemia, unspecified: Secondary | ICD-10-CM | POA: Diagnosis not present

## 2021-07-24 DIAGNOSIS — R252 Cramp and spasm: Secondary | ICD-10-CM | POA: Insufficient documentation

## 2021-07-24 DIAGNOSIS — E1065 Type 1 diabetes mellitus with hyperglycemia: Secondary | ICD-10-CM | POA: Diagnosis not present

## 2021-07-24 LAB — POCT GLYCOSYLATED HEMOGLOBIN (HGB A1C): Hemoglobin A1C: 7.8 % — AB (ref 4.0–5.6)

## 2021-07-24 MED ORDER — DEXCOM G6 TRANSMITTER MISC: 1.0000 | 3 refills | Status: DC

## 2021-07-24 MED ORDER — DEXCOM G6 TRANSMITTER MISC
1.0000 | 3 refills | Status: DC
  Filled 2021-07-30: qty 1, 90d supply, fill #0
  Filled 2022-01-05: qty 1, 90d supply, fill #1
  Filled 2022-04-03: qty 1, 90d supply, fill #2
  Filled 2022-07-03: qty 1, 90d supply, fill #3

## 2021-07-24 MED ORDER — DEXCOM G6 SENSOR MISC: 1.0000 | 3 refills | Status: DC

## 2021-07-24 MED ORDER — DAPAGLIFLOZIN PROPANEDIOL 5 MG PO TABS
5.0000 mg | ORAL_TABLET | Freq: Every day | ORAL | 3 refills | Status: DC
  Filled 2021-07-24: qty 90, 90d supply, fill #0
  Filled 2021-10-19: qty 90, 90d supply, fill #1
  Filled 2022-01-05: qty 90, 90d supply, fill #2

## 2021-07-24 MED ORDER — DEXCOM G6 SENSOR MISC
1.0000 | 3 refills | Status: AC
  Filled 2021-07-30: qty 3, 30d supply, fill #0

## 2021-07-24 NOTE — Assessment & Plan Note (Signed)
Patient is having more cramping but seems to be more secondary to after activity.  I think it could be secondary to mechanical issues and we discussed different shoes that I think will be more beneficial.  Patient blood sugar has been relatively well controlled with patient having glucose monitor, iron supplementation has helped any iron deficiency that would also potentially contribute. ?

## 2021-07-24 NOTE — Patient Instructions (Addendum)
Try to schedule an appt. With Leonia Reader. ? ?Please use the following pump settings: ?- Basal rates: ?12 AM: 1.15 >> 1.25 ?3 AM: 1.10 >> 1.25 ?4 AM: 1.20 >> 1.25 ?6:30 AM: 1.60 >> 1.70 ?8 AM: 1.90 ?10 AM: 1.65 ?12 PM: 1.45 ?4 PM: 1.20 ?9 PM: 1.55 ?10:30 PM: 1.45 ?- bolus:  ?- Insulin to carb ratio: 1:3 ?- Insulin sensitivity factor:  25 >> 20 ?- target: 100-120 ?- Active insulin time: 3 hours ? ?When correcting a low blood sugar >> check with the glucometer, not CGM. ? ?Please continue: ?- Metformin 1000 mg 2x with meals  ?- Actos 22.5 mg daily in a.m. ? ?Please try to start: ?- Farxiga 5 mg before breakfast ? ?Please come back for a follow-up appointment in 3-4 months. ? ? ? ? ?

## 2021-07-24 NOTE — Progress Notes (Signed)
Patient ID: Zachary Graham, male   DOB: Oct 09, 1960, 61 y.o.   MRN: [id number]   This visit occurred during the SARS-CoV-2 public health emergency.  Safety protocols were in place, including screening questions prior to the visit, additional usage of staff PPE, and extensive cleaning of exam room while observing appropriate contact time as indicated for disinfecting solutions.   HPI: Zachary Graham is a 61 y.o.-year-old male, presenting for f/u for DM1, dx 1993, insulin-dependent, uncontrolled, with complications (mild sensory peripheral neuropathy; hypoglycemia episodes, DR). Last visit 4 months ago.  Interim history: No increased urination, blurry vision, nausea,, she had chest discomfort recently due to stress at work.  He will have a stress test at the [place] soon. He does have iron deficiency anemia with an unknown etiology.  Previous colonoscopy was normal.  On iron therapy. He continues to have long hours at work and significantly increased stress and burnout.  He still comes home late, sometimes after 9 PM and eating dinner as late.  Insulin pump: - Started in 2006 - He is on Medtronic 670 G pump  CGM: -Libre CGM as a backup -now using a Dexcom G6 CGM -but was off the sensor for the last 2 weeks due to delayed shipment.  Insulin: - Prev. Humalog >> Lyumjev  (50 vials for 3 months).  She initially lost 15 pounds after switching from Humalog to Lyumjev.  Reviewed HbA1c levels: Lab Results  Component Value Date   HGBA1C 8.0 12/27/2020   HGBA1C 7.6 (A) 12/05/2020   HGBA1C 8.3 (H) 10/28/2020   HGBA1C 7.2 (A) 07/24/2020   HGBA1C 7.2 (A) 03/26/2020   HGBA1C 7.3 (A) 11/20/2019   HGBA1C 7.4 (A) 08/02/2019   HGBA1C 7.4 (A) 04/06/2019   HGBA1C 7.6 (A) 12/08/2018   HGBA1C 8.2 (H) 10/19/2018   HGBA1C 7.7 (A) 05/02/2018   HGBA1C 7.3 (A) 01/05/2018   HGBA1C 8.1 (A) 10/03/2017   HGBA1C 7.6 03/10/2017   HGBA1C 9.7 (H) 12/02/2016   HGBA1C 7.3 04/22/2016   HGBA1C 7.8 02/17/2016   HGBA1C 8.0  11/13/2015   HGBA1C 7.8 (H) 10/14/2015   HGBA1C 8.1 05/30/2015  10/2019: HbA1c calculated from fructosamine: 6.3%  Pump settings: - Basal rates: 12 AM: 1.15 3 AM: 1.10 4 AM: 1.20  6:30 AM: 1.40 >> 1.6 8 AM: 1.90 10 AM: 1.65 12 PM: 1.45 4 PM: 1.20 9 PM: 1.55 10:30 PM: 1.30 >> 1.45 - bolus:  - Insulin to carb ratio: 1:4 except 3 pm to 12 am: ICR 1:3.5 - Insulin sensitivity factor:  25 - target: 100-120 - Active insulin time: 3 hours  TDD basal: 39% >> 36% >> 32% TDD bolus: 61% >> 64% >> 68% TDD 122 units >> 210-240  >> 105-130 units a day - Bolus wizard: On - Changes the pump site:  every 1-2 >> every 2-3 days   He is also on:  - Metformin 500 >> 1000 mg twice a day - Actos 45 mg daily-started 12/2019 >> decreased to 22.5 mg in 07/5007 We tried Trulicy in 38/1829 but he stopped in 02/2019 due to nausea and abdominal pain.  He checks his sugars more than 4 times a day with his Dexcom CGM:  Also:  Prev.:   Previously:   Lowest: 50 >> 70s >> 70s Highest: 300 >> 300 >> upper 300s. He has a history of a severe hypoglycemic episode in 2016.  He has a glucagon kit at home (refilled by the [place]). No previous DKA admissions.  -No CKD, last  BUN/creatinine:  Lab Results  Component Value Date   BUN 10 12/27/2020   CREATININE 0.8 12/27/2020  04/11/2018: 10/0.778, ACR 11.1 On Cozaar.  -+ HL; last set of lipids: Lab Results  Component Value Date   CHOL 98 12/27/2020   HDL 52 12/27/2020   LDLCALC 45 10/28/2020   LDLDIRECT 87.0 12/02/2016   TRIG 74 12/27/2020   CHOLHDL 3 10/28/2020  06/23/2020: 79/74/43/21 On high-dose Lipitor.  - last eye exam was in 03/2021: No DR, previously + Mild NPDR. [facility].   -+ Numbness and tingling in his feet.  This has improved on the B complex.  Up-to-date with foot exam-10/2020. B12 was normal, 855, on 01/28/2021.  He also has a history of GERD, HTN, OSA.  Latest TSH was normal: Lab Results  Component Value Date   TSH  2.48 12/27/2020  04/11/2018: TSH 3.02  Vitamin D levels have been normal: Lab Results  Component Value Date   VD25OH 74.87 10/28/2020   VD25OH 59.85 10/23/2019   VD25OH 68.22 10/19/2018   VD25OH 83.45 10/03/2017   VD25OH 41.95 06/06/2015   VD25OH 35.03 07/17/2014   VD25OH 44 03/01/2013  04/11/2018: vitamin D 46.7.   Previously on ergocalciferol.  He has adhesive capsulitis >> improved.  He is getting occasional steroid injections but we discussed to reduce these as much as possible.  On meloxicam.  ROS: + see HPI Neurological: no tremors/+ numbness/+ tingling/no dizziness  I reviewed pt's medications, allergies, PMH, social hx, family hx, and changes were documented in the history of present illness. Otherwise, unchanged from my initial visit note.  Past Medical History:  Diagnosis Date   Diabetes mellitus    GERD (gastroesophageal reflux disease)    Heart murmur    Hyperlipidemia    Hypertension    OSA (obstructive sleep apnea)    Pneumonia    Past Surgical History:  Procedure Laterality Date   CERVICAL DISC ARTHROPLASTY N/A 03/16/2017   Procedure: CERVICAL FIVE- CERVICAL SIX [place] ARTHROPLASTY;  Surgeon: Jovita Gamma, MD;  Location: [place];  Service: Neurosurgery;  Laterality: N/A;  CERVICAL 5- CERVICAL 6 DISC ARTHROPLASTY   COLONOSCOPY     TONSILLECTOMY     Social History   Socioeconomic History   Marital status: Married    Spouse name: Not on file   Number of children: 3   Years of education: Not on file   Highest education level: Not on file  Occupational History   Occupation: RADIATION SAFETY OFFICER    Employer: [employer]  Tobacco Use   Smoking status: Never   Smokeless tobacco: Never  Vaping Use   Vaping Use: Never used  Substance and Sexual Activity   Alcohol use: No    Alcohol/week: 0.0 standard drinks   Drug use: No   Sexual activity: Yes    Partners: Female  Other Topics Concern   Not on file  Social History Narrative   Regular  exercise: runs 3 miles 3x week, takes one day off per week to rest   Caffeine use: stopped all diet soda on 04/23/15 per advice from Sports MD   Social Determinants of Health   Financial Resource Strain: Not on file  Food Insecurity: Not on file  Transportation Needs: Not on file  Physical Activity: Not on file  Stress: Not on file  Social Connections: Not on file  Intimate Partner Violence: Not on file   Current Outpatient Medications on File Prior to Visit  Medication Sig Dispense Refill   Alpha-Lipoic Acid  600 MG CAPS Take 1 capsule by mouth daily.      amphetamine-dextroamphetamine (ADDERALL XR) 20 MG 24 hr capsule TAKE 1 CAPSULE BY MOUTH ONCE DAILY IN THE MORNING. 30 capsule 0   amphetamine-dextroamphetamine (ADDERALL XR) 20 MG 24 hr capsule TAKE 1 CAPSULE BY MOUTH ONCE DAILY IN THE MORNING. 30 capsule 0   Ascorbic Acid (VITAMIN C) 1000 MG tablet Take 1,000 mg by mouth daily.     atorvastatin (LIPITOR) 80 MG tablet Take 1 tablet (80 mg total) by mouth daily. 90 tablet 1   azelastine (ASTELIN) 0.1 % nasal spray Place 2 sprays into both nostrils 2 (two) times daily. Use in each nostril as directed 90 mL 1   b complex vitamins tablet Take 1 tablet by mouth daily.     BAYER MICROLET LANCETS lancets USE TO TEST BLOOD SUGAR 6 TIMES A DAY 300 each 11   Beclomethasone Dipropionate (QNASL) 80 MCG/ACT AERS Place 2 puffs into the nose daily as needed (allergies). 10.6 g 5   Calcium-Magnesium-Vitamin D (CALCIUM 500 PO) Take 1 tablet by mouth daily.      Continuous Blood Gluc Receiver (FREESTYLE LIBRE 2 READER) DEVI 1 each by Does not apply route daily. 1 each 0   Continuous Blood Gluc Sensor (FREESTYLE LIBRE 2 SENSOR) MISC 1 each by Does not apply route every 14 (fourteen) days. 6 each 3   COVID-19 At Home Antigen Test (CARESTART COVID-19 HOME TEST) KIT Use as directed. 4 each 0   famotidine (PEPCID) 40 MG tablet Take 1 tablet by mouth at bedtime.     ferrous sulfate 325 (65 FE) MG tablet Take 1  tablet (325 mg total) by mouth daily with breakfast. 90 tablet 0   Glucagon 3 MG/DOSE POWD PLACE 3 MG INTO THE NOSE ONCE AS NEEDED FOR UP TO 1 DOSE. 1 each 11   glucose blood (CONTOUR NEXT TEST) test strip USE AS INSTRUCTED TO CHECK SUGAR 6 TIMES DAILY 400 strip 3   hydrOXYzine (ATARAX) 10 MG tablet Take 1 tablet by mouth at bedtime as needed. 90 tablet 0   Insulin Lispro-aabc (LYUMJEV) 100 UNIT/ML SOLN USE UP TO 120 UNITS IN THE INSULIN PUMP DAILY 50 mL 1   levocetirizine (XYZAL) 5 MG tablet TAKE 1 TABLET BY MOUTH IN THE EVENING 90 tablet 1   losartan (COZAAR) 50 MG tablet TAKE 1 TABLET BY MOUTH DAILY. 90 tablet 1   Magnesium Oxide 400 MG CAPS Take 1 capsule (400 mg total) by mouth daily. 90 capsule 1   metFORMIN (GLUCOPHAGE) 500 MG tablet Take 2 tablets (1,000 mg total) by mouth 2 (two) times daily before a meal. 360 tablet 0   NON FORMULARY CPAP     omeprazole (PRILOSEC) 40 MG capsule TAKE ONE CAPSULE BY MOUTH TWICE A DAY BEFORE MEALS (TAKE ON AN EMPTY STOMACH 30 MINUTES PRIOR TO A MEAL)     pioglitazone (ACTOS) 45 MG tablet TAKE 1/2 TABLET BY MOUTH ONCE DAILY 45 tablet 2   traZODone (DESYREL) 50 MG tablet TAKE 1 TABLET BY MOUTH AT BEDTIME 90 tablet 1   Vitamin D, Ergocalciferol, (DRISDOL) 1.25 MG (50000 UNIT) CAPS capsule TAKE 1 CAPSULE BY MOUTH EVERY 7 DAYS 12 capsule 0   No current facility-administered medications on file prior to visit.   Allergies  Allergen Reactions   Lisinopril Swelling and Other (See Comments)    Extreme facial swelling causing hospitalization   Shellfish Allergy Anaphylaxis   Zetia [Ezetimibe] Other (See Comments)    ABDOMINAL  CRAMPING   Dulaglutide Other (See Comments)   Gabapentin     Other reaction(s): Dizziness   Gabapentin (Once-Daily) Other (See Comments)   Iodine    Penicillins Other (See Comments)    UNSPECIFIED REACTION > Childhood allergy     Cymbalta [Duloxetine Hcl] Other (See Comments)    Severe feet cramps   Family History  Problem  Relation Age of Onset   Colon cancer Maternal Grandfather    Colon cancer Paternal Grandfather    Heart disease Father        Valve replacement; pacemaker   Alcohol abuse Neg Hx    Asthma Neg Hx    Diabetes Neg Hx    Hyperlipidemia Neg Hx    Hypertension Neg Hx    Kidney disease Neg Hx    Stroke Neg Hx    PE: There were no vitals taken for this visit. There is no height or weight on file to calculate BMI.  Wt Readings from Last 3 Encounters:  07/23/21 240 lb (108.9 kg)  06/17/21 237 lb (107.5 kg)  04/23/21 231 lb (104.8 kg)   Constitutional: overweight, in NAD Eyes: PERRLA, EOMI, no exophthalmos ENT: moist mucous membranes, no thyromegaly, no cervical lymphadenopathy Cardiovascular: RRR, No MRG Respiratory: CTA B Musculoskeletal: no deformities, strength intact in all 4 Skin: moist, warm, no rashes Neurological: no tremor with outstretched hands, DTR normal in all 4  ASSESSMENT: 1. DM1, insulin-dependent, uncontrolled, without complications - DR - PN  - Barriers to good control: Very busy schedule >> less time to check sugars and eat but But he does a great job with bolusing during the day >> still has a lot of large boluses during the day, sometimes without documented carbs or sugars Lack of sleep >> usually sleeps max 5h a night (!) and tries to catch up in the weekend Reaches home late at night  - sometimes at 1 am >> eats >65% of the daily calories then  2. HL  PLAN:  1. Patient with longstanding, uncontrolled, type 1 diabetes, with high insulin resistance, managed on an insulin pump and also Actos 22.5 mg daily and metformin 1000 mg twice a day to improve his insulin resistance.  At last visit, sugars are higher and an HbA1c was also higher, at 8.0%.  At that time, reviewing the CGM trends, his sugars are higher than before increasing especially in the morning after approximately 6 AM.  At that time, per questioning, he was starting to drive for work.  He did not have  coffee or breakfast before this.  In this case, I advised him to increase his basal rate from 6:30 AM to 8 AM.  Sugars were fairly well controlled around lunchtime but they were increasing again after 4 PM, and upon questioning, this is when he had the Nutrisystem snack.  This had 28 to 30 g of carbs.  Sugars remained high afterwards including after dinner, which was quite late.  They were starting to decrease around 2 AM.  We discussed about increasing the basal rate between 10:30 PM and midnight and also strengthened in his insulin to carb ratio to 3 PM for his afternoon snack and also dinner. -At our last visit, we discussed about obtaining a pump that integrated with a sensor in the closed-loop technology and I advised him to discuss with the [facility] and his insurance to see which device would be covered. Discussed about advantages and disadvantages of each system: - Medtronic + Guardian - Omnipod (discussed the  differences between 5 and Dash) + Dexcom G7 - t:slim + Dexcom G7 -At this visit, he tells me that he obtained a new pump, however, he put it away and is not even sure which one it is.  He believes it is the OmniPod 5.  We discussed about the need to start this as soon as possible.  As of now, even though the Dexcom G7 CGM is out, it is not integrating with the OmniPod 5 so I advised him to stay on the Dexcom G6 a little longer.  We gave him a sample at today's visit due to the delay in receiving the shipment from the [place]. -I advised him to schedule an appointment with the diabetes educator for pump training.  I also advised him to go to the [facility] website and follow the instructions to establish an account. CGM interpretation: -At today's visit, we reviewed his CGM downloads: It appears that 44% of values are in target range (goal >70%), while 56% are higher than 180 (goal <25%), and 0% are lower than 70 (goal <4%).  The calculated average blood sugar is 198.  The projected HbA1c for the next 3 months  (GMI) is 8.1%. -Reviewing the CGM trends, it appears that the sugars in the last 2 weeks or slightly higher than before.  They appear to be higher consistently during the night, and also during the first half of the morning, improving before lunch.  After lunch, they are slightly higher and they are very fluctuating around dinnertime, mostly high.  Therefore, at today's visit I advised him to increase the basal rates at night and, since he complains that his sugars are not improving significantly after the doing a correction, we will strengthen his insulin sensitivity factor.  Since sugars increase after he eats with almost all meals, I advised him to strengthen the insulin to carb ratios. -He also mentions that whenever he has to correct a low, he has to repeatedly take fast carbs but his sugars fail to increase significantly and when they start doing so, they continue to increase to very high values.  We discussed that this is most likely due to the fact that he is checking the sugars with his CGM after correction, which lags behind the blood values.  I advised him to always use the glucometer to check blood sugars after correction. -At this visit, he also inquires about medications with weight loss as a side effect.  He tried a GLP-1 receptor agonist in the past, Trulicity, and had GI side effects.  We discussed about SGLT2 inhibitors and he is interested in starting [place].  I explained that this class of medication is not approved for type 1 diabetes and would be used off label for this.  Discussed about the mechanism of action and the risk of DKA if becoming significantly dehydrated and I advised him to stay well-hydrated at all times. -I also advised him that he may need to back off the insulin doses if he starts [place] and sugars started to improve.  Moreover, I am hoping that we can stop Actos if he gets good results with [facility] Glade. - I advised him to:  Patient Instructions  Try to schedule an appt.  With Leonia Reader.  Please use the following pump settings: - Basal rates: 12 AM: 1.15 >> 1.25 3 AM: 1.10 >> 1.25 4 AM: 1.20 >> 1.25 6:30 AM: 1.60 >> 1.70 8 AM: 1.90 10 AM: 1.65 12 PM: 1.45 4 PM: 1.20 9 PM: 1.55  10:30 PM: 1.45 - bolus:  - Insulin to carb ratio: 1:3 - Insulin sensitivity factor:  25 >> 20 - target: 100-120 - Active insulin time: 3 hours  When correcting a low blood sugar >> check with the glucometer, not CGM.  Please continue: - Metformin 1000 mg 2x with meals  - Actos 22.5 mg daily in a.m.  Please try to start: - Farxiga 5 mg before breakfast  Please come back for a follow-up appointment in 3-4 months.  - we checked his HbA1c: 7.8% (slightly lower, but lower than expected from the CGM) - advised to check sugars at different times of the day - 4x a day, rotating check times - advised for yearly eye exams >> he is UTD - return to clinic in 3-4 months  2. HL -Reviewed latest lipid panel from 12/2020: All fractions at goal: Lab Results  Component Value Date   CHOL 98 12/27/2020   HDL 52 12/27/2020   LDLCALC 45 10/28/2020   LDLDIRECT 87.0 12/02/2016   TRIG 74 12/27/2020   CHOLHDL 3 10/28/2020  -Continues Lipitor 80 mg daily without side effects  Total time spent for the visit: 40 min, in reviewing his pump downloads, discussing his hypo- and hyper-glycemic episodes, reviewing previous labs and pump settings and developing a plan to avoid hypo- and hyper-glycemia.  Please see the discussed topics above.  Philemon Kingdom, MD PhD [facility]

## 2021-07-30 ENCOUNTER — Other Ambulatory Visit (HOSPITAL_COMMUNITY): Payer: Self-pay

## 2021-07-31 ENCOUNTER — Telehealth: Payer: Self-pay

## 2021-07-31 ENCOUNTER — Other Ambulatory Visit (HOSPITAL_COMMUNITY): Payer: Self-pay

## 2021-07-31 NOTE — Telephone Encounter (Signed)
Patient Advocate Encounter ?  ?Received notification from [facility] that prior authorization for Dexcom G6 senosrs is required by his/her insurance [employer]. ?  ?PA submitted on 07/31/21 ? ?Key#: [id number] ? ?Status is pending ?   ?[facility] will continue to follow: ? ?Patient Advocate ?Fax: [phone]  ?

## 2021-08-03 ENCOUNTER — Other Ambulatory Visit (HOSPITAL_COMMUNITY): Payer: Self-pay

## 2021-08-03 ENCOUNTER — Other Ambulatory Visit: Payer: Self-pay | Admitting: Family Medicine

## 2021-08-04 ENCOUNTER — Other Ambulatory Visit (HOSPITAL_COMMUNITY): Payer: Self-pay

## 2021-08-04 MED ORDER — HYDROXYZINE HCL 10 MG PO TABS
10.0000 mg | ORAL_TABLET | Freq: Every evening | ORAL | 0 refills | Status: DC | PRN
  Filled 2021-08-04: qty 90, 90d supply, fill #0

## 2021-08-04 NOTE — Telephone Encounter (Signed)
Patient Advocate Encounter ? ?Prior Authorization for [employer] has been approved.   ? ?PA# [id number] ? ?Effective dates: 08/02/21 through 08/02/22 ? ?Per Test Claim Patients co-pay is $66.18. (30 DS) ? ?Spoke with Pharmacy to Process. ? ?Patient Advocate ?Fax: [phone]  ?

## 2021-08-05 ENCOUNTER — Other Ambulatory Visit (HOSPITAL_COMMUNITY): Payer: Self-pay

## 2021-08-06 ENCOUNTER — Other Ambulatory Visit (HOSPITAL_COMMUNITY): Payer: Self-pay

## 2021-08-07 ENCOUNTER — Other Ambulatory Visit (HOSPITAL_COMMUNITY): Payer: Self-pay

## 2021-08-13 ENCOUNTER — Other Ambulatory Visit: Payer: Self-pay | Admitting: Internal Medicine

## 2021-08-13 ENCOUNTER — Other Ambulatory Visit (HOSPITAL_COMMUNITY): Payer: Self-pay

## 2021-08-13 DIAGNOSIS — F900 Attention-deficit hyperactivity disorder, predominantly inattentive type: Secondary | ICD-10-CM

## 2021-08-14 ENCOUNTER — Other Ambulatory Visit (HOSPITAL_COMMUNITY): Payer: Self-pay

## 2021-08-14 ENCOUNTER — Emergency Department (INDEPENDENT_AMBULATORY_CARE_PROVIDER_SITE_OTHER)
Admission: RE | Admit: 2021-08-14 | Discharge: 2021-08-14 | Disposition: A | Discharge status: Home / Self Care | Payer: 59 | Source: Ambulatory Visit | Attending: Family Medicine | Admitting: Family Medicine

## 2021-08-14 VITALS — BP 136/79 | HR 89 | Temp 100.2°F | Resp 18 | Ht 70.0 in | Wt 235.0 lb

## 2021-08-14 DIAGNOSIS — U071 COVID-19: Secondary | ICD-10-CM

## 2021-08-14 LAB — POCT INFLUENZA A/B
Influenza A, POC: NEGATIVE
Influenza B, POC: NEGATIVE

## 2021-08-14 LAB — POC SARS CORONAVIRUS 2 AG -  ED: SARS Coronavirus 2 Ag: POSITIVE — AB

## 2021-08-14 MED ORDER — HYDROCODONE-ACETAMINOPHEN 5-325 MG PO TABS
1.0000 | ORAL_TABLET | Freq: Four times a day (QID) | ORAL | 0 refills | Status: DC | PRN
  Filled 2021-08-14: qty 8, 2d supply, fill #0

## 2021-08-14 MED ORDER — MOLNUPIRAVIR EUA 200MG CAPSULE
ORAL_CAPSULE | ORAL | 0 refills | Status: DC
  Filled 2021-08-14: qty 40, 5d supply, fill #0

## 2021-08-14 NOTE — ED Triage Notes (Signed)
Pt states that he has a headache and nasal congestion. X1 day ? ?Pt states that he is vaccinated for covid ?Pt states that he has flu vaccine.  ?

## 2021-08-14 NOTE — Discharge Instructions (Signed)
Take plain guaifenesin ('1200mg'$  extended release tabs such as Mucinex) twice daily, with plenty of water, for cough and congestion.  Get adequate rest.   ?May take Delsym Cough Suppressant ("12 Hour Cough Relief") at bedtime for nighttime cough.  ?Try warm salt water gargles for sore throat.  ?Stop all antihistamines for now, and other non-prescription cough/cold preparations. ?  ?If symptoms become significantly worse during the night or over the weekend, proceed to the local emergency room.  ? ?Your COVID-19 test is positive.  Isolate yourself for five days from today.  At the end of five days you may end isolation if your symptoms have cleared or improved, and you have not had a fever for 24 hours. At this time you should wear a mask for five more days when you are around others. ?  ? ?   ? ?

## 2021-08-14 NOTE — ED Provider Notes (Signed)
?[place] ? ? ? ?CSN: [id number] ?Arrival date & time: 08/14/21  1346 ? ? ?  ? ?History   ?Chief Complaint ?Chief Complaint  ?Patient presents with  ? Headache  ?  Entered by patient  ? ? ?HPI ?HAYLEN SHELNUTT is a 61 y.o. male.  ? ?Patient reports that he began to feel fatigued yesterday, developing myalgias, nasal congestion, and a mild cough.  Today he developed a rather severe headache without neurologic symptoms.  He denies pleuritic pain and shortness of breath. ?He has had flu and COVID19 vaccinations.  He has history of COVID19 infection ? ?The history is provided by the patient.  ? ?Past Medical History:  ?Diagnosis Date  ? Diabetes mellitus   ? GERD (gastroesophageal reflux disease)   ? Heart murmur   ? Hyperlipidemia   ? Hypertension   ? OSA (obstructive sleep apnea)   ? Pneumonia   ? ? ?Patient Active Problem List  ? Diagnosis Date Noted  ? Leg cramping 07/24/2021  ? Iron deficiency anemia secondary to inadequate dietary iron intake 02/02/2021  ? Deficiency anemia 01/28/2021  ? 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 Hypertension   ? Low back pain 07/10/2020  ? Nonallopathic lesion of thoracic region 04/28/2020  ? Unifocal PVCs 10/23/2019  ? Chronic prostatitis without hematuria 05/08/2019  ? 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 HNP (herniated nucleus pulposus), cervical 03/16/2017  ? 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 ([place]) 01/06/2017  ? Herniation of cervical intervertebral disc with radiculopathy 01/04/2017  ? Current mild episode of major depressive disorder without prior episode ([place]) 12/02/2016  ? Diabetic neuropathy ([place]) 03/01/2016  ? Obesity, Class II, BMI 35.0-39.9, with comorbidity (see actual BMI) 04/24/2015  ? Type 1 diabetes mellitus with hyperglycemia, with long-term current use of insulin ([place]) 04/04/2015  ? Patellofemoral stress syndrome of left knee 03/19/2015  ? Allergic rhinitis due to  pollen 11/14/2014  ? Equinus deformity of foot, acquired 05/29/2014  ? ADHD (attention deficit hyperactivity disorder), inattentive type 10/24/2013  ? Vitamin D deficiency 02/28/2013  ? Pulmonic stenosis 05/25/2012  ? Pure hyperglyceridemia 05/25/2012  ? Hyperlipidemia with target LDL less than 100 09/20/2011  ? OSA (obstructive sleep apnea) 09/20/2011  ? BPH (benign prostatic hyperplasia) 09/20/2011  ? Routine general medical examination at a health care facility 09/20/2011  ? ? ?Past Surgical History:  ?Procedure Laterality Date  ? CERVICAL DISC ARTHROPLASTY N/A 03/16/2017  ? Procedure: CERVICAL FIVE- CERVICAL SIX [place] ARTHROPLASTY;  Surgeon: Jovita Gamma, MD;  Location: [facility];  Service: Neurosurgery;  Laterality: N/A;  CERVICAL 5- CERVICAL 6 DISC ARTHROPLASTY  ? COLONOSCOPY    ? TONSILLECTOMY    ? ? ? ? ? ?Home Medications   ? ?Prior to Admission medications   ?Medication Sig Start Date End Date Taking? Authorizing Provider  ?Alpha-Lipoic Acid 600 MG CAPS Take 1 capsule by mouth daily.    Yes [provider]  ?amphetamine-dextroamphetamine (ADDERALL XR) 20 MG 24 hr capsule TAKE 1 CAPSULE BY MOUTH ONCE DAILY IN THE MORNING. 03/08/21 09/04/21 Yes [name], MD  ?amphetamine-dextroamphetamine (ADDERALL XR) 20 MG 24 hr capsule TAKE 1 CAPSULE BY MOUTH ONCE DAILY IN THE MORNING. 07/15/21 01/11/22 Yes [name], MD  ?Ascorbic Acid (VITAMIN C) 1000 MG tablet Take 1,000 mg by mouth daily.   Yes [provider]  ?atorvastatin (LIPITOR) 80 MG tablet Take 1 tablet (80 mg total) by mouth daily. 04/27/21  Yes [name], MD  ?azelastine (ASTELIN) 0.1 % nasal spray Place 2 sprays into both nostrils 2 (two) times daily. Use in each nostril as directed 05/08/19  Yes [name], MD  ?b complex vitamins tablet Take 1 tablet by mouth daily.   Yes [provider]  ?BAYER MICROLET LANCETS lancets USE TO TEST BLOOD SUGAR 6 TIMES A DAY 09/13/17  Yes Philemon Kingdom, MD  ?Beclomethasone  Dipropionate (QNASL) 80 MCG/ACT AERS Place 2 puffs into the nose daily as needed (allergies). 08/17/18  Yes [name], MD  ?Calcium-Magnesium-Vitamin D (CALCIUM 500 PO) Take 1 tablet by mouth daily.    Yes [provider]  ?Continuous Blood Gluc Sensor (DEXCOM G6 SENSOR) MISC Inject 1 Device into the skin continuous for 10 days. 07/24/21 09/05/21 Yes Philemon Kingdom, MD  ?Continuous Blood Gluc Transmit (DEXCOM G6 TRANSMITTER) MISC Use to monitor blood glucose, replace every 90 days. 07/24/21  Yes Philemon Kingdom, MD  ?COVID-19 At Home Antigen Test [facility] COVID-19 HOME TEST) KIT Use as directed. 05/15/21  Yes Edmon Crape, RPH  ?dapagliflozin propanediol (FARXIGA) 5 MG TABS tablet Take 1 tablet (5 mg total) by mouth daily before breakfast. 07/24/21  Yes Philemon Kingdom, MD  ?ferrous sulfate 325 (65 FE) MG tablet Take 1 tablet (325 mg total) by mouth daily with breakfast. 02/02/21  Yes [name], MD  ?glucose blood (CONTOUR NEXT TEST) test strip USE AS INSTRUCTED TO CHECK SUGAR 6 TIMES DAILY 07/15/21 08/05/22 Yes Philemon Kingdom, MD  ?HYDROcodone-acetaminophen (NORCO/VICODIN) 5-325 MG tablet Take 1 tablet by mouth every 6 (six) hours as needed for moderate pain or severe pain. 08/14/21  Yes Kandra Nicolas, MD  ?hydrOXYzine (ATARAX) 10 MG tablet Take 1 tablet by mouth at bedtime as needed. 08/04/21  Yes [name], DO  ?Insulin Lispro-aabc (LYUMJEV) 100 UNIT/ML SOLN USE UP TO 120 UNITS IN THE INSULIN PUMP DAILY 07/15/21 07/15/22 Yes Philemon Kingdom, MD  ?levocetirizine (XYZAL) 5 MG tablet TAKE 1 TABLET BY MOUTH IN THE EVENING 11/10/20 11/10/21 Yes [name], MD  ?losartan (COZAAR) 50 MG tablet TAKE 1 TABLET BY MOUTH DAILY. 04/27/21 04/27/22 Yes [name], MD  ?Magnesium Oxide 400 MG CAPS Take 1 capsule (400 mg total) by mouth daily. 06/08/18  Yes [name], MD  ?metFORMIN (GLUCOPHAGE) 500 MG tablet Take 2 tablets (1,000 mg total) by mouth 2 (two) times daily before a meal.  03/05/21 03/05/22 Yes Philemon Kingdom, MD  ?molnupiravir EUA (LAGEVRIO) 200 mg CAPS capsule Take 4 capsules by mouth every 12 hours for 5 days 08/14/21  Yes Kandra Nicolas, MD  ?NON FORMULARY CPAP   Yes [provider]  ?traZODone (DESYREL) 50 MG tablet TAKE 1 TABLET BY MOUTH AT BEDTIME 04/27/21 04/27/22 Yes [name], MD  ?Vitamin D, Ergocalciferol, (DRISDOL) 1.25 MG (50000 UNIT) CAPS capsule TAKE 1 CAPSULE BY MOUTH EVERY 7 DAYS 07/15/21 07/15/22 Yes [name], DO  ?famotidine (PEPCID) 40 MG tablet Take 1 tablet by mouth at bedtime. 08/01/20 08/02/21  [provider]  ?omeprazole (PRILOSEC) 40 MG capsule TAKE ONE CAPSULE BY MOUTH TWICE A DAY BEFORE MEALS (TAKE ON AN EMPTY STOMACH 30 MINUTES PRIOR TO A MEAL) 08/01/20 08/02/21  [provider]  ?pioglitazone (ACTOS) 45 MG tablet TAKE 1/2 TABLET BY MOUTH ONCE DAILY 05/26/20 05/26/21  Philemon Kingdom, MD  ? ? ?Family History ?Family History  ?Problem Relation Age of Onset  ? Colon cancer Maternal Grandfather   ?  Colon cancer Paternal Grandfather   ? Heart disease Father   ?     Valve replacement; pacemaker  ? Alcohol abuse Neg Hx   ? Asthma Neg Hx   ? Diabetes Neg Hx   ? Hyperlipidemia Neg Hx   ? Hypertension Neg Hx   ? Kidney disease Neg Hx   ? Stroke Neg Hx   ? ? ?Social History ?Social History  ? ?Tobacco Use  ? Smoking status: Never  ? Smokeless tobacco: Never  ?Vaping Use  ? Vaping Use: Never used  ?Substance Use Topics  ? Alcohol use: No  ?  Alcohol/week: 0.0 standard drinks  ? Drug use: No  ? ? ? ?Allergies   ?Lisinopril, Shellfish allergy, Zetia [ezetimibe], Dulaglutide, Gabapentin, Gabapentin (once-daily), Iodine, Penicillins, and Cymbalta [duloxetine hcl] ? ? ?Review of Systems ?Review of Systems ?No sore throat ?+ mild cough ?No pleuritic pain ?No wheezing ?+ nasal congestion ?? post-nasal drainage ?No sinus pain/pressure ?No itchy/red eyes ?No earache ?No hemoptysis ?No SOB ?+ fever  ?No nausea ?No vomiting ?No abdominal  pain ?No diarrhea ?No urinary symptoms ?No skin rash ?+ fatigue ?+ myalgias ?+ headache today ? ? ?Physical Exam ?Triage Vital Signs ?ED Triage Vitals  ?Enc Vitals Group  ?   BP 08/14/21 1414 136/79  ?   P

## 2021-08-15 ENCOUNTER — Other Ambulatory Visit (HOSPITAL_COMMUNITY): Payer: Self-pay

## 2021-08-15 MED ORDER — CARESTART COVID-19 HOME TEST VI KIT
PACK | 0 refills | Status: DC
Start: 2021-08-15 — End: 2021-09-05
  Filled 2021-08-15: qty 4, 4d supply, fill #0

## 2021-08-18 ENCOUNTER — Encounter: Payer: Self-pay | Admitting: Internal Medicine

## 2021-08-18 ENCOUNTER — Other Ambulatory Visit (HOSPITAL_COMMUNITY): Payer: Self-pay

## 2021-08-18 ENCOUNTER — Other Ambulatory Visit: Payer: Self-pay | Admitting: Internal Medicine

## 2021-08-18 DIAGNOSIS — F900 Attention-deficit hyperactivity disorder, predominantly inattentive type: Secondary | ICD-10-CM

## 2021-08-18 MED ORDER — AMPHETAMINE-DEXTROAMPHET ER 20 MG PO CP24
ORAL_CAPSULE | ORAL | 0 refills | Status: DC
  Filled 2021-08-18: qty 30, 30d supply, fill #0

## 2021-09-01 ENCOUNTER — Other Ambulatory Visit (HOSPITAL_COMMUNITY): Payer: Self-pay

## 2021-09-01 ENCOUNTER — Ambulatory Visit: Payer: 59 | Admitting: Internal Medicine

## 2021-09-01 ENCOUNTER — Encounter: Payer: Self-pay | Admitting: Internal Medicine

## 2021-09-01 VITALS — BP 118/66 | HR 64 | Temp 98.1°F | Ht 70.0 in | Wt 234.0 lb

## 2021-09-01 DIAGNOSIS — I10 Essential (primary) hypertension: Secondary | ICD-10-CM | POA: Diagnosis not present

## 2021-09-01 DIAGNOSIS — F320 Major depressive disorder, single episode, mild: Secondary | ICD-10-CM | POA: Diagnosis not present

## 2021-09-01 DIAGNOSIS — E139 Other specified diabetes mellitus without complications: Secondary | ICD-10-CM | POA: Diagnosis not present

## 2021-09-01 DIAGNOSIS — E785 Hyperlipidemia, unspecified: Secondary | ICD-10-CM

## 2021-09-01 DIAGNOSIS — J301 Allergic rhinitis due to pollen: Secondary | ICD-10-CM | POA: Diagnosis not present

## 2021-09-01 DIAGNOSIS — I493 Ventricular premature depolarization: Secondary | ICD-10-CM

## 2021-09-01 DIAGNOSIS — E1065 Type 1 diabetes mellitus with hyperglycemia: Secondary | ICD-10-CM

## 2021-09-01 MED ORDER — QNASL 80 MCG/ACT NA AERS
2.0000 | INHALATION_SPRAY | Freq: Every day | NASAL | 5 refills | Status: AC | PRN
  Filled 2021-09-01: qty 10.6, 30d supply, fill #0

## 2021-09-01 MED ORDER — LOSARTAN POTASSIUM 50 MG PO TABS
ORAL_TABLET | Freq: Every day | ORAL | 1 refills | Status: DC
  Filled 2021-09-01: qty 90, fill #0
  Filled 2021-11-04: qty 90, 90d supply, fill #0
  Filled 2022-02-11: qty 90, 90d supply, fill #1

## 2021-09-01 MED ORDER — TRAZODONE HCL 50 MG PO TABS
ORAL_TABLET | Freq: Every day | ORAL | 1 refills | Status: DC
  Filled 2021-09-01: qty 90, fill #0
  Filled 2021-11-04: qty 90, 90d supply, fill #0
  Filled 2022-01-05 – 2022-02-11 (×2): qty 90, 90d supply, fill #1

## 2021-09-01 MED ORDER — LEVOCETIRIZINE DIHYDROCHLORIDE 5 MG PO TABS
ORAL_TABLET | Freq: Every evening | ORAL | 1 refills | Status: DC
  Filled 2021-09-01: qty 90, 90d supply, fill #0
  Filled 2022-01-05: qty 90, 90d supply, fill #1

## 2021-09-01 MED ORDER — ATORVASTATIN CALCIUM 80 MG PO TABS
80.0000 mg | ORAL_TABLET | Freq: Every day | ORAL | 1 refills | Status: DC
  Filled 2021-09-01: qty 90, 90d supply, fill #0
  Filled 2022-01-05: qty 90, 90d supply, fill #1

## 2021-09-01 NOTE — Progress Notes (Signed)
? ?Subjective:  ?Patient ID: Zachary Graham, male    DOB: 12-14-1960  Age: 61 y.o. MRN: [id number] ? ?CC: Hypertension and Diabetes ? ? ?HPI ?Zachary Graham presents for f/up - ? ?He is active and denies chest pain, shortness of breath, fatigue, diaphoresis, dizziness, lightheadedness, palpitations, or near syncope. ? ?Outpatient Medications Prior to Visit  ?Medication Sig Dispense Refill  ? Alpha-Lipoic Acid 600 MG CAPS Take 1 capsule by mouth daily.     ? amphetamine-dextroamphetamine (ADDERALL XR) 20 MG 24 hr capsule TAKE 1 CAPSULE BY MOUTH ONCE DAILY IN THE MORNING. 30 capsule 0  ? Ascorbic Acid (VITAMIN C) 1000 MG tablet Take 1,000 mg by mouth daily.    ? azelastine (ASTELIN) 0.1 % nasal spray Place 2 sprays into both nostrils 2 (two) times daily. Use in each nostril as directed 90 mL 1  ? b complex vitamins tablet Take 1 tablet by mouth daily.    ? BAYER MICROLET LANCETS lancets USE TO TEST BLOOD SUGAR 6 TIMES A DAY 300 each 11  ? Calcium-Magnesium-Vitamin D (CALCIUM 500 PO) Take 1 tablet by mouth daily.     ? Continuous Blood Gluc Sensor (DEXCOM G6 SENSOR) MISC Inject 1 Device into the skin continuous for 10 days. 9 each 3  ? Continuous Blood Gluc Transmit (DEXCOM G6 TRANSMITTER) MISC Use to monitor blood glucose, replace every 90 days. 1 each 3  ? dapagliflozin propanediol (FARXIGA) 5 MG TABS tablet Take 1 tablet (5 mg total) by mouth daily before breakfast. 90 tablet 3  ? ferrous sulfate 325 (65 FE) MG tablet Take 1 tablet (325 mg total) by mouth daily with breakfast. 90 tablet 0  ? glucose blood (CONTOUR NEXT TEST) test strip USE AS INSTRUCTED TO CHECK SUGAR 6 TIMES DAILY 400 strip 3  ? hydrOXYzine (ATARAX) 10 MG tablet Take 1 tablet by mouth at bedtime as needed. 90 tablet 0  ? Insulin Lispro-aabc (LYUMJEV) 100 UNIT/ML SOLN USE UP TO 120 UNITS IN THE INSULIN PUMP DAILY 50 mL 1  ? Magnesium Oxide 400 MG CAPS Take 1 capsule (400 mg total) by mouth daily. 90 capsule 1  ? metFORMIN (GLUCOPHAGE) 500 MG tablet Take  2 tablets (1,000 mg total) by mouth 2 (two) times daily before a meal. 360 tablet 0  ? NON FORMULARY CPAP    ? Vitamin D, Ergocalciferol, (DRISDOL) 1.25 MG (50000 UNIT) CAPS capsule TAKE 1 CAPSULE BY MOUTH EVERY 7 DAYS 12 capsule 0  ? atorvastatin (LIPITOR) 80 MG tablet Take 1 tablet (80 mg total) by mouth daily. 90 tablet 1  ? Beclomethasone Dipropionate (QNASL) 80 MCG/ACT AERS Place 2 puffs into the nose daily as needed (allergies). 10.6 g 5  ? COVID-19 At Home Antigen Test [facility] COVID-19 HOME TEST) KIT Use as directed within package instructions 4 each 0  ? HYDROcodone-acetaminophen (NORCO/VICODIN) 5-325 MG tablet Take 1 tablet by mouth every 6 (six) hours as needed for moderate pain or severe pain. 8 tablet 0  ? levocetirizine (XYZAL) 5 MG tablet TAKE 1 TABLET BY MOUTH IN THE EVENING 90 tablet 1  ? losartan (COZAAR) 50 MG tablet TAKE 1 TABLET BY MOUTH DAILY. 90 tablet 1  ? molnupiravir EUA (LAGEVRIO) 200 mg CAPS capsule Take 4 capsules by mouth every 12 hours for 5 days 40 capsule 0  ? traZODone (DESYREL) 50 MG tablet TAKE 1 TABLET BY MOUTH AT BEDTIME 90 tablet 1  ? famotidine (PEPCID) 40 MG tablet Take 1 tablet by mouth at bedtime.    ?  omeprazole (PRILOSEC) 40 MG capsule TAKE ONE CAPSULE BY MOUTH TWICE A DAY BEFORE MEALS (TAKE ON AN EMPTY STOMACH 30 MINUTES PRIOR TO A MEAL)    ? pioglitazone (ACTOS) 45 MG tablet TAKE 1/2 TABLET BY MOUTH ONCE DAILY 45 tablet 2  ? COVID-19 At Home Antigen Test [facility] COVID-19 HOME TEST) KIT Use as directed. 4 each 0  ? ?No facility-administered medications prior to visit.  ? ? ?ROS ?Review of Systems  ?Constitutional:  Negative for chills, diaphoresis, fatigue and fever.  ?HENT: Negative.    ?Eyes: Negative.   ?Respiratory:  Negative for cough, chest tightness, shortness of breath and wheezing.   ?Cardiovascular:  Negative for chest pain, palpitations and leg swelling.  ?Gastrointestinal:  Negative for abdominal pain, constipation, diarrhea and vomiting.  ?Endocrine:  Negative.   ?Genitourinary: Negative.  Negative for difficulty urinating.  ?Musculoskeletal: Negative.  Negative for arthralgias and myalgias.  ?Skin: Negative.   ?Neurological:  Negative for dizziness, weakness, light-headedness and numbness.  ?Hematological:  Negative for adenopathy. Does not bruise/bleed easily.  ?Psychiatric/Behavioral:  Positive for dysphoric mood. Negative for sleep disturbance. The patient is nervous/anxious.   ? ?Objective:  ?BP 118/66 (BP Location: Left Arm, Patient Position: Sitting, Cuff Size: Large)   Pulse 64   Temp 98.1 ?F (36.7 ?C) (Oral)   Ht _0  (1.778 m)   Wt 234 lb (106.1 kg)   SpO2 99%   BMI 33.58 kg/m?  ? ?BP Readings from Last 3 Encounters:  ?09/03/21 112/80  ?09/01/21 118/66  ?08/14/21 136/79  ? ? ?Wt Readings from Last 3 Encounters:  ?09/03/21 237 lb (107.5 kg)  ?09/01/21 234 lb (106.1 kg)  ?08/14/21 235 lb (106.6 kg)  ? ? ?Physical Exam ?Vitals reviewed.  ?HENT:  ?   Nose: Nose normal.  ?   Mouth/Throat:  ?   Mouth: Mucous membranes are moist.  ?Eyes:  ?   General: No scleral icterus. ?   Conjunctiva/sclera: Conjunctivae normal.  ?Cardiovascular:  ?   Rate and Rhythm: Normal rate and regular rhythm. FrequentExtrasystoles are present. ?   Heart sounds: Murmur heard.  ?Systolic murmur is present with a grade of 1/6.  ?No diastolic murmur is present.  ?  No gallop.  ?Pulmonary:  ?   Effort: Pulmonary effort is normal.  ?   Breath sounds: No stridor. No wheezing, rhonchi or rales.  ?Abdominal:  ?   General: Abdomen is flat.  ?   Palpations: There is no mass.  ?   Tenderness: There is no abdominal tenderness. There is no guarding or rebound.  ?   Hernia: No hernia is present.  ?Musculoskeletal:  ?   Right lower leg: No edema.  ?   Left lower leg: No edema.  ?Skin: ?   General: Skin is warm and dry.  ?Neurological:  ?   General: No focal deficit present.  ?   Mental Status: Mental status is at baseline.  ?Psychiatric:     ?   Mood and Affect: Mood normal.     ?    Behavior: Behavior normal.     ?   Thought Content: Thought content normal.     ?   Judgment: Judgment normal.  ? ? ?Lab Results  ?Component Value Date  ? WBC 10.6 (H) 01/28/2021  ? HGB 13.7 01/28/2021  ? HCT 41.6 01/28/2021  ? PLT 227.0 01/28/2021  ? GLUCOSE 139 (H) 10/28/2020  ? CHOL 98 12/27/2020  ? TRIG 74 12/27/2020  ? HDL 52 12/27/2020  ?  LDLDIRECT 87.0 12/02/2016  ? LDLCALC 45 10/28/2020  ? ALT 26 12/27/2020  ? AST 17 12/27/2020  ? NA 136 (A) 12/27/2020  ? K 4.2 12/27/2020  ? CL 101 12/27/2020  ? CREATININE 0.8 12/27/2020  ? BUN 10 12/27/2020  ? CO2 30 (A) 12/27/2020  ? TSH 2.48 12/27/2020  ? PSA 0.313 12/27/2020  ? HGBA1C 7.8 (A) 07/24/2021  ? MICROALBUR 0.9 10/28/2020  ? ? ?No results found. ? ?Assessment & Plan:  ? ?Zyion was seen today for hypertension and diabetes. ? ?Diagnoses and all orders for this visit: ? ?Frequent PVCs-he is asymptomatic with respect to this. ? ?Current mild episode of major depressive disorder without prior episode (HCC) ?-     traZODone (DESYREL) 50 MG tablet; TAKE 1 TABLET BY MOUTH AT BEDTIME ? ?Hyperlipidemia with target LDL less than 100 ?-     atorvastatin (LIPITOR) 80 MG tablet; Take 1 tablet by mouth daily. ? ?Allergic rhinitis due to pollen ?-     Beclomethasone Dipropionate (QNASL) 80 MCG/ACT AERS; Place 2 puffs into the nose daily as needed (allergies). ? ?Seasonal allergic rhinitis due to pollen ?-     levocetirizine (XYZAL) 5 MG tablet; TAKE 1 TABLET BY MOUTH IN THE EVENING ? ?Type 1 diabetes mellitus with hyperglycemia, with long-term current use of insulin (HCC) ?-     losartan (COZAAR) 50 MG tablet; TAKE 1 TABLET BY MOUTH DAILY. ? ?Primary hypertension- His blood pressure is adequately well controlled. ?-     losartan (COZAAR) 50 MG tablet; TAKE 1 TABLET BY MOUTH DAILY. ? ?Diabetes 1.5, managed as type 1 ([place]) ?-     losartan (COZAAR) 50 MG tablet; TAKE 1 TABLET BY MOUTH DAILY. ? ? ?I have discontinued Zachary Graham's Carestart COVID-19 Home Test, molnupiravir EUA,  HYDROcodone-acetaminophen, and [place] COVID-19 Home Test. I have also changed his atorvastatin. Additionally, I am having him maintain his Calcium-Magnesium-Vitamin D (CALCIUM 500 PO), vitamin C, Alpha-Lipoic Acid

## 2021-09-01 NOTE — Progress Notes (Signed)
?[name] D.O. ?[facility] ?[place] ?Phone: [phone] ?Subjective:   ?I, Zachary Graham, am serving as a scribe for Dr. Hulan Saas. ? ?This visit occurred during the SARS-CoV-2 public health emergency.  Safety protocols were in place, including screening questions prior to the visit, additional usage of staff PPE, and extensive cleaning of exam room while observing appropriate contact time as indicated for disinfecting solutions.  ? ? ?I'm seeing this patient by the request  of:  [name], MD ? ?CC: back and neck pain  ? ?YIF:OYDXAJOINO  ?Zachary Graham is a 61 y.o. male coming in with complaint of back and neck pain. OMT 07/23/2021. Patient states that he had COVID 4 weeks ago. Patient is unable to smell since then.  ? ?Medications patient has been prescribed: Vit D ? ?Taking: ?Since we have seen patient patient did have COVID infection at the end of March. ? ?  ? ? ? ? ?Reviewed prior external information including notes and imaging from previsou exam, outside providers and external EMR if available.  ? ?As well as notes that were available from care everywhere and other healthcare systems. ? ?Past medical history, social, surgical and family history all reviewed in electronic medical record.  No pertanent information unless stated regarding to the chief complaint.  ? ?Past Medical History:  ?Diagnosis Date  ? Diabetes mellitus   ? GERD (gastroesophageal reflux disease)   ? Heart murmur   ? Hyperlipidemia   ? Hypertension   ? OSA (obstructive sleep apnea)   ? Pneumonia   ?  ?Allergies  ?Allergen Reactions  ? Lisinopril Swelling and Other (See Comments)  ?  Extreme facial swelling causing hospitalization  ? Shellfish Allergy Anaphylaxis  ? Zetia [Ezetimibe] Other (See Comments)  ?  ABDOMINAL CRAMPING  ? Dulaglutide Other (See Comments)  ? Gabapentin   ?  Other reaction(s): Dizziness  ? Gabapentin (Once-Daily) Other (See Comments)  ? Iodine   ? Penicillins Other  (See Comments)  ?  UNSPECIFIED REACTION > Childhood allergy  ?  ? Cymbalta [Duloxetine Hcl] Other (See Comments)  ?  Severe feet cramps  ? ? ? ?Review of Systems: ? No headache, visual changes, nausea, vomiting, diarrhea, constipation, dizziness, abdominal pain, skin rash, fevers, chills, night sweats, weight loss, swollen lymph nodes, body aches, joint swelling, chest pain, shortness of breath, mood changes. POSITIVE muscle aches ? ?Objective  ?Blood pressure 112/80, pulse 72, height '5\' 10"'$  (1.778 m), weight 237 lb (107.5 kg), SpO2 96 %. ?  ?General: No apparent distress alert and oriented x3 mood and affect normal, dressed appropriately.  ?HEENT: Pupils equal, extraocular movements intact  ?Respiratory: Patient's speak in full sentences and does not appear short of breath  ?Cardiovascular: No lower extremity edema, non tender, no erythema  ?Gait normal with good balance and coordination.  ?Continues to have some decreased range of motion of the back.  Seems to be more though of the scapular region on the left side and the low back as usual.  Seems to be more right sacroiliac joint with tightness with Corky Sox test. ? ?Osteopathic findings ? ? ?T5 extended rotated and side bent left inhaled rib ?T9 extended rotated and side bent left ?L2 flexed rotated and side bent right ?Sacrum right on right ? ? ? ? ?  ?Assessment and Plan: ? ?Back pain ?Mild chronic problem with exacerbation.  Continues to respond relatively well though to osteopathic manipulation.  Patient continues to try  to avoid medications where he can.  We have discussed with him though that muscle relaxers could be helpful.  Taking trazodone.  Patient is to encouraged to start increasing his activity again.  Patient will follow-up with me again in 6 to 8 weeks ?  ? ?Nonallopathic problems ? ?Decision today to treat with OMT was based on Physical Exam ? ?After verbal consent patient was treated with HVLA, ME, FPR techniques in  rib, thoracic, lumbar, and  sacral  areas ? ?Patient tolerated the procedure well with improvement in symptoms ? ?Patient given exercises, stretches and lifestyle modifications ? ?See medications in patient instructions if given ? ?Patient will follow up in 4-8 weeks ? ?  ? ?The above documentation has been reviewed and is accurate and complete Zachary Pulley, DO ? ? ? ?  ? ? Note: This dictation was prepared with Dragon dictation along with smaller phrase technology. Any transcriptional errors that result from this process are unintentional.    ?  ?  ? ?

## 2021-09-03 ENCOUNTER — Ambulatory Visit (INDEPENDENT_AMBULATORY_CARE_PROVIDER_SITE_OTHER): Payer: 59 | Admitting: Family Medicine

## 2021-09-03 ENCOUNTER — Other Ambulatory Visit (HOSPITAL_COMMUNITY): Payer: Self-pay

## 2021-09-03 VITALS — BP 112/80 | HR 72 | Ht 70.0 in | Wt 237.0 lb

## 2021-09-03 DIAGNOSIS — G8929 Other chronic pain: Secondary | ICD-10-CM | POA: Diagnosis not present

## 2021-09-03 DIAGNOSIS — M9903 Segmental and somatic dysfunction of lumbar region: Secondary | ICD-10-CM

## 2021-09-03 DIAGNOSIS — M9904 Segmental and somatic dysfunction of sacral region: Secondary | ICD-10-CM

## 2021-09-03 DIAGNOSIS — M9902 Segmental and somatic dysfunction of thoracic region: Secondary | ICD-10-CM

## 2021-09-03 DIAGNOSIS — M545 Low back pain, unspecified: Secondary | ICD-10-CM

## 2021-09-03 NOTE — Patient Instructions (Addendum)
Good to see you  ?Make sure to take that time for yourself  ?See me again in 8-12 weeks  ?

## 2021-09-03 NOTE — Assessment & Plan Note (Signed)
Mild chronic problem with exacerbation.  Continues to respond relatively well though to osteopathic manipulation.  Patient continues to try to avoid medications where he can.  We have discussed with him though that muscle relaxers could be helpful.  Taking trazodone.  Patient is to encouraged to start increasing his activity again.  Patient will follow-up with me again in 6 to 8 weeks ?

## 2021-09-08 ENCOUNTER — Telehealth: Payer: Self-pay | Admitting: Nutrition

## 2021-09-08 NOTE — Telephone Encounter (Signed)
LVM to call me back to schedule pump training ?

## 2021-09-24 ENCOUNTER — Other Ambulatory Visit (HOSPITAL_COMMUNITY): Payer: Self-pay

## 2021-09-24 ENCOUNTER — Other Ambulatory Visit: Payer: Self-pay | Admitting: Internal Medicine

## 2021-09-24 DIAGNOSIS — E139 Other specified diabetes mellitus without complications: Secondary | ICD-10-CM

## 2021-09-24 DIAGNOSIS — F900 Attention-deficit hyperactivity disorder, predominantly inattentive type: Secondary | ICD-10-CM

## 2021-09-24 DIAGNOSIS — E1065 Type 1 diabetes mellitus with hyperglycemia: Secondary | ICD-10-CM

## 2021-09-24 DIAGNOSIS — I10 Essential (primary) hypertension: Secondary | ICD-10-CM

## 2021-09-24 MED ORDER — AMPHETAMINE-DEXTROAMPHET ER 20 MG PO CP24
ORAL_CAPSULE | ORAL | 0 refills | Status: DC
  Filled 2021-09-24: qty 30, 30d supply, fill #0

## 2021-09-25 ENCOUNTER — Other Ambulatory Visit (HOSPITAL_COMMUNITY): Payer: Self-pay

## 2021-09-26 ENCOUNTER — Other Ambulatory Visit (HOSPITAL_COMMUNITY): Payer: Self-pay

## 2021-09-26 ENCOUNTER — Other Ambulatory Visit: Payer: Self-pay | Admitting: Internal Medicine

## 2021-09-28 DIAGNOSIS — Z7984 Long term (current) use of oral hypoglycemic drugs: Secondary | ICD-10-CM | POA: Diagnosis not present

## 2021-09-28 DIAGNOSIS — E1065 Type 1 diabetes mellitus with hyperglycemia: Secondary | ICD-10-CM | POA: Diagnosis not present

## 2021-09-28 DIAGNOSIS — Z9641 Presence of insulin pump (external) (internal): Secondary | ICD-10-CM | POA: Diagnosis not present

## 2021-09-29 ENCOUNTER — Other Ambulatory Visit (HOSPITAL_COMMUNITY): Payer: Self-pay

## 2021-09-29 ENCOUNTER — Ambulatory Visit: Payer: 59 | Admitting: Internal Medicine

## 2021-09-29 MED ORDER — PIOGLITAZONE HCL 45 MG PO TABS
ORAL_TABLET | Freq: Every day | ORAL | 2 refills | Status: DC
  Filled 2021-09-29: qty 45, 90d supply, fill #0

## 2021-09-30 LAB — HM DIABETES EYE EXAM

## 2021-10-13 ENCOUNTER — Other Ambulatory Visit: Payer: Self-pay | Admitting: Family Medicine

## 2021-10-13 ENCOUNTER — Other Ambulatory Visit (HOSPITAL_COMMUNITY): Payer: Self-pay

## 2021-10-13 MED ORDER — VITAMIN D (ERGOCALCIFEROL) 1.25 MG (50000 UNIT) PO CAPS
ORAL_CAPSULE | ORAL | 0 refills | Status: DC
  Filled 2021-10-13: qty 12, 84d supply, fill #0

## 2021-10-19 ENCOUNTER — Other Ambulatory Visit (HOSPITAL_COMMUNITY): Payer: Self-pay

## 2021-10-26 ENCOUNTER — Other Ambulatory Visit: Payer: Self-pay | Admitting: Internal Medicine

## 2021-10-26 ENCOUNTER — Other Ambulatory Visit (HOSPITAL_COMMUNITY): Payer: Self-pay

## 2021-10-26 DIAGNOSIS — F900 Attention-deficit hyperactivity disorder, predominantly inattentive type: Secondary | ICD-10-CM

## 2021-10-26 MED ORDER — AMPHETAMINE-DEXTROAMPHET ER 20 MG PO CP24
ORAL_CAPSULE | ORAL | 0 refills | Status: DC
  Filled 2021-10-26: qty 30, 30d supply, fill #0

## 2021-10-27 NOTE — Progress Notes (Signed)
Zachary Graham [facility] [address] [place] Phone: [phone] Subjective:   [name], am serving as a scribe for Dr. Hulan Saas.  I'm seeing this patient by the request  of:  [name], MD  CC: back pain follow-up  GBT:[id number]  Zachary Graham is a 61 y.o. male coming in with complaint of back pain. OMT 09/03/2021. Patient states same per usual. No new complaints.  Patient feels like having a little less stress than previously.  Had one episode of abdominal pain that was a little abnormal but that has gone away.  Medications patient has been prescribed: Hydroxizine  Taking:         Past Medical History:  Diagnosis Date   Diabetes mellitus    GERD (gastroesophageal reflux disease)    Heart murmur    Hyperlipidemia    Hypertension    OSA (obstructive sleep apnea)    Pneumonia     Allergies  Allergen Reactions   Lisinopril Swelling and Other (See Comments)    Extreme facial swelling causing hospitalization   Shellfish Allergy Anaphylaxis   Zetia [Ezetimibe] Other (See Comments)    ABDOMINAL CRAMPING   Dulaglutide Other (See Comments)   Gabapentin     Other reaction(s): Dizziness   Gabapentin (Once-Daily) Other (See Comments)   Iodine    Penicillins Other (See Comments)    UNSPECIFIED REACTION > Childhood allergy     Cymbalta [Duloxetine Hcl] Other (See Comments)    Severe feet cramps     Review of Systems:  No headache, visual changes, nausea, vomiting, diarrhea, constipation, dizziness, abdominal pain, skin rash, fevers, chills, night sweats, weight loss, swollen lymph nodes, body aches, joint swelling, chest pain, shortness of breath, mood changes. POSITIVE muscle aches  Objective  Blood pressure 132/84, pulse 72, height '5\' 10"'$  (1.778 m), weight 236 lb (107 kg), SpO2 96 %.   General: No apparent distress alert and oriented x3 mood and affect normal, dressed appropriately.  HEENT: Pupils equal, extraocular  movements intact  Respiratory: Patient's speak in full sentences and does not appear short of breath  Cardiovascular: No lower extremity edema, non tender, no erythema  MSK: Weakness still noted the hip abductors bilaterally.  Patient does have a still some poor core strength noted.  Tightness with FABER test right greater than left. Back   Osteopathic findings   T5 extended rotated and side bent right inhaled rib L5 flexed rotated and side bent left Sacrum right on right       Assessment and Plan:  Low back pain Patient does have low back pain.  We discussed topical anti-inflammatories if needed.  The patient overall seems to be doing relatively well with the medications.  I do feel that the left stress has been helpful as well.  Encouraged him to continue to work out and work on Copywriter, advertising.  Follow-up with me again in 6 to 8 weeks    Nonallopathic problems  Decision today to treat with OMT was based on Physical Exam  After verbal consent patient was treated with HVLA, ME, FPR techniques in  rib, thoracic, lumbar, and sacral  areas  Patient tolerated the procedure well with improvement in symptoms  Patient given exercises, stretches and lifestyle modifications  See medications in patient instructions if given  Patient will follow up in 8 weeks             Note: This dictation was prepared with Dragon dictation along with  smaller phrase technology. Any transcriptional errors that result from this process are unintentional.

## 2021-10-28 DIAGNOSIS — H40003 Preglaucoma, unspecified, bilateral: Secondary | ICD-10-CM | POA: Diagnosis not present

## 2021-10-29 ENCOUNTER — Ambulatory Visit (INDEPENDENT_AMBULATORY_CARE_PROVIDER_SITE_OTHER): Payer: 59 | Admitting: Family Medicine

## 2021-10-29 VITALS — BP 132/84 | HR 72 | Ht 70.0 in | Wt 236.0 lb

## 2021-10-29 DIAGNOSIS — G8929 Other chronic pain: Secondary | ICD-10-CM

## 2021-10-29 DIAGNOSIS — M9902 Segmental and somatic dysfunction of thoracic region: Secondary | ICD-10-CM | POA: Diagnosis not present

## 2021-10-29 DIAGNOSIS — M545 Low back pain, unspecified: Secondary | ICD-10-CM | POA: Diagnosis not present

## 2021-10-29 DIAGNOSIS — M9908 Segmental and somatic dysfunction of rib cage: Secondary | ICD-10-CM | POA: Diagnosis not present

## 2021-10-29 DIAGNOSIS — M9904 Segmental and somatic dysfunction of sacral region: Secondary | ICD-10-CM | POA: Diagnosis not present

## 2021-10-29 DIAGNOSIS — M9903 Segmental and somatic dysfunction of lumbar region: Secondary | ICD-10-CM

## 2021-10-29 NOTE — Patient Instructions (Signed)
Good to see you! Have an amazing time on your cruise See you again in 2 months

## 2021-10-29 NOTE — Assessment & Plan Note (Signed)
Patient does have low back pain.  We discussed topical anti-inflammatories if needed.  The patient overall seems to be doing relatively well with the medications.  I do feel that the left stress has been helpful as well.  Encouraged him to continue to work out and work on Copywriter, advertising.  Follow-up with me again in 6 to 8 weeks

## 2021-11-04 ENCOUNTER — Other Ambulatory Visit: Payer: Self-pay | Admitting: Family Medicine

## 2021-11-04 ENCOUNTER — Other Ambulatory Visit (HOSPITAL_COMMUNITY): Payer: Self-pay

## 2021-11-04 MED ORDER — HYDROXYZINE HCL 10 MG PO TABS
10.0000 mg | ORAL_TABLET | Freq: Every evening | ORAL | 0 refills | Status: DC | PRN
  Filled 2021-11-04: qty 90, 90d supply, fill #0

## 2021-11-09 ENCOUNTER — Encounter: Payer: Self-pay | Admitting: Internal Medicine

## 2021-11-10 ENCOUNTER — Other Ambulatory Visit (HOSPITAL_COMMUNITY): Payer: Self-pay

## 2021-11-10 MED ORDER — INSULIN ASPART 100 UNIT/ML IJ SOLN
INTRAMUSCULAR | 3 refills | Status: DC
  Filled 2021-11-10: qty 40, 80d supply, fill #0

## 2021-11-11 ENCOUNTER — Other Ambulatory Visit (HOSPITAL_COMMUNITY): Payer: Self-pay

## 2021-11-12 ENCOUNTER — Other Ambulatory Visit (HOSPITAL_COMMUNITY): Payer: Self-pay

## 2021-11-23 ENCOUNTER — Other Ambulatory Visit: Payer: Self-pay | Admitting: Internal Medicine

## 2021-11-23 ENCOUNTER — Other Ambulatory Visit (HOSPITAL_COMMUNITY): Payer: Self-pay

## 2021-11-23 DIAGNOSIS — F900 Attention-deficit hyperactivity disorder, predominantly inattentive type: Secondary | ICD-10-CM

## 2021-11-24 ENCOUNTER — Other Ambulatory Visit (HOSPITAL_COMMUNITY): Payer: Self-pay

## 2021-11-24 MED ORDER — AMPHETAMINE-DEXTROAMPHET ER 20 MG PO CP24
ORAL_CAPSULE | ORAL | 0 refills | Status: DC
  Filled 2021-11-24: qty 30, 30d supply, fill #0

## 2021-11-30 ENCOUNTER — Encounter: Payer: Self-pay | Admitting: Internal Medicine

## 2021-11-30 ENCOUNTER — Ambulatory Visit (INDEPENDENT_AMBULATORY_CARE_PROVIDER_SITE_OTHER): Payer: 59 | Admitting: Internal Medicine

## 2021-11-30 VITALS — BP 150/78 | HR 82 | Ht 70.0 in | Wt 235.0 lb

## 2021-11-30 DIAGNOSIS — E1065 Type 1 diabetes mellitus with hyperglycemia: Secondary | ICD-10-CM | POA: Diagnosis not present

## 2021-11-30 DIAGNOSIS — E785 Hyperlipidemia, unspecified: Secondary | ICD-10-CM

## 2021-11-30 LAB — POCT GLYCOSYLATED HEMOGLOBIN (HGB A1C): Hemoglobin A1C: 7.4 % — AB (ref 4.0–5.6)

## 2021-11-30 NOTE — Patient Instructions (Addendum)
Please use the following pump settings: - basal: 12 AM: 1.15 3 AM: 1.10 >> 1.15 4 AM: 1.30 6:30 AM: 1.60 8 AM: 1.90 10 AM: 1.65 12 PM: 1.45 4 PM: 1.35  8 pm: 1.60 >> 1.55 9 PM: 1.55 10:30 PM: 1.45 - bolus:  - Insulin to carb ratio: 1:3 - Insulin sensitivity factor:  25  - target: 120-120 - Active insulin time: 3 hours  Switch back to Lyumjev when you run out of NovoLog. Inject Novolog 15 min before meals.  Please continue: - Metformin 1000 mg 2x with meals  - Farxiga 5 mg before breakfast  Try to stop Actos.  Please come back for a follow-up appointment in 4 months.

## 2021-11-30 NOTE — Progress Notes (Signed)
Patient ID: Zachary Graham, male   DOB: 1961/04/23, 61 y.o.   MRN: [id number]   HPI: Zachary Graham is a 61 y.o.-year-old male, presenting for f/u for DM1, dx 1993, insulin-dependent, uncontrolled, with complications (mild sensory peripheral neuropathy; hypoglycemia episodes, DR). Last visit 4 months ago.  Interim history: No increased urination, blurry vision, nausea.  He continues to have long hours at work and significantly increased stress and burnout.  He still comes home late, sometimes after 9 PM and eating dinner as late. He was in the emergency room with COVID 07/2021. He is preparing to go on a [place] cruise at the end of the month: [place], [place], [place].  Insulin pump: - Started in 2006 - Medtronic 670 G pump - now t:slim x2 - started beginning of 10/2021  CGM: -Libre CGM as a backup -now Dexcom G6 CGM  Insulin: - Prev. Humalog >> Lyumjev  (50 vials for 3 months).  He initially lost 15 pounds after switching from Humalog to Lyumjev. - However, since last visit, he wanted to get back to NovoLog due to Lyumjev not being approved in the pump  Reviewed HbA1c levels: Lab Results  Component Value Date   HGBA1C 7.8 (A) 07/24/2021   HGBA1C 8.0 12/27/2020   HGBA1C 7.6 (A) 12/05/2020   HGBA1C 8.3 (H) 10/28/2020   HGBA1C 7.2 (A) 07/24/2020   HGBA1C 7.2 (A) 03/26/2020   HGBA1C 7.3 (A) 11/20/2019   HGBA1C 7.4 (A) 08/02/2019   HGBA1C 7.4 (A) 04/06/2019   HGBA1C 7.6 (A) 12/08/2018   HGBA1C 8.2 (H) 10/19/2018   HGBA1C 7.7 (A) 05/02/2018   HGBA1C 7.3 (A) 01/05/2018   HGBA1C 8.1 (A) 10/03/2017   HGBA1C 7.6 03/10/2017   HGBA1C 9.7 (H) 12/02/2016   HGBA1C 7.3 04/22/2016   HGBA1C 7.8 02/17/2016   HGBA1C 8.0 11/13/2015   HGBA1C 7.8 (H) 10/14/2015  10/2019: HbA1c calculated from fructosamine: 6.3%  Pump settings: - Basal rates: 12 AM: 1.15 3 AM: 1.10 4 AM: 1.20 >> 1.25 >> 1.30 6:30 AM: 1.60 8 AM: 1.90 10 AM: 1.65 12 PM: 1.45 4 PM: 1.20 >> 1.35 8 pm: 1.60 9 PM:  1.55 10:30 PM: 1.45 - bolus:  - Insulin to carb ratio: 1:3 - Insulin sensitivity factor:  25 - target: 100-120 >> 120-120 - Active insulin time: 3 hours  TDD basal: 39% >> 36% >> 32% >> 34% TDD bolus: 61% >> 64% >> 68% >> 66% TDD 122 units >> 210-240  >> 105-130 units a day - Bolus wizard: On - Changes the pump site:  every 1-2 >> every 2-3 days   He is also on:  - Metformin 500 >> 1000 mg twice a day - Actos 45 mg daily-started 12/2019 >> decreased to 22.5 mg in 01/2020 - Farxiga 5 mg before breakfast-started 63/3354 We tried Trulicy in 56/2563 but he stopped in 02/2019 due to nausea and abdominal pain.  He checks his sugars more than 4 times a day with his Dexcom CGM:   Previously:  Also:  Prev.:   Lowest: 50 >> 70s >> 70s >> 40. Highest: 300 >> 300 >> upper 300s >> 380. He has a history of a severe hypoglycemic episode in 2016.  He has a glucagon kit at home (refilled by the [place]). No previous DKA admissions.  -No CKD, last BUN/creatinine:  Lab Results  Component Value Date   BUN 10 12/27/2020   CREATININE 0.8 12/27/2020  04/11/2018: 10/0.778, ACR 11.1 On Cozaar.  -+ HL; last set of lipids:  Lab Results  Component Value Date   CHOL 98 12/27/2020   HDL 52 12/27/2020   LDLCALC 45 10/28/2020   LDLDIRECT 87.0 12/02/2016   TRIG 74 12/27/2020   CHOLHDL 3 10/28/2020  06/23/2020: 79/74/43/21 On high-dose Lipitor.  - last eye exam was in 03/2021: No DR, previously + Mild NPDR. [facility].   -+ Numbness and tingling in his feet.  This has improved on the B complex.Last foot exam-10/2020. B12 was normal, 855, on 01/28/2021.  He also has a history of GERD, HTN, OSA.  Latest TSH was normal: Lab Results  Component Value Date   TSH 2.48 12/27/2020  04/11/2018: TSH 3.02  Vitamin D levels have been normal: Lab Results  Component Value Date   VD25OH 74.87 10/28/2020   VD25OH 59.85 10/23/2019   VD25OH 68.22 10/19/2018   VD25OH 83.45 10/03/2017   VD25OH 41.95  06/06/2015   VD25OH 35.03 07/17/2014   VD25OH 44 03/01/2013  04/11/2018: vitamin D 46.7.   Previously on ergocalciferol.  He has adhesive capsulitis >> improved.  He is getting occasional steroid injections but we discussed to reduce these as much as possible.  On meloxicam.  ROS: + see HPI Neurological: no tremors/+ numbness/+ tingling/no dizziness  I reviewed pt's medications, allergies, PMH, social hx, family hx, and changes were documented in the history of present illness. Otherwise, unchanged from my initial visit note.  Past Medical History:  Diagnosis Date   Diabetes mellitus    GERD (gastroesophageal reflux disease)    Heart murmur    Hyperlipidemia    Hypertension    OSA (obstructive sleep apnea)    Pneumonia    Past Surgical History:  Procedure Laterality Date   CERVICAL DISC ARTHROPLASTY N/A 03/16/2017   Procedure: CERVICAL FIVE- CERVICAL SIX Leon ARTHROPLASTY;  Surgeon: Jovita Gamma, MD;  Location: [facility];  Service: Neurosurgery;  Laterality: N/A;  CERVICAL 5- CERVICAL 6 DISC ARTHROPLASTY   COLONOSCOPY     TONSILLECTOMY     Social History   Socioeconomic History   Marital status: Married    Spouse name: Not on file   Number of children: 3   Years of education: Not on file   Highest education level: Not on file  Occupational History   Occupation: RADIATION SAFETY OFFICER    Employer: [employer]  Tobacco Use   Smoking status: Never   Smokeless tobacco: Never  Vaping Use   Vaping Use: Never used  Substance and Sexual Activity   Alcohol use: No    Alcohol/week: 0.0 standard drinks of alcohol   Drug use: No   Sexual activity: Yes    Partners: Female  Other Topics Concern   Not on file  Social History Narrative   Regular exercise: runs 3 miles 3x week, takes one day off per week to rest   Caffeine use: stopped all diet soda on 04/23/15 per advice from Sports MD   Social Determinants of Health   Financial Resource Strain: Not on file  Food  Insecurity: Not on file  Transportation Needs: Not on file  Physical Activity: Not on file  Stress: Not on file  Social Connections: Not on file  Intimate Partner Violence: Not on file   Current Outpatient Medications on File Prior to Visit  Medication Sig Dispense Refill   Alpha-Lipoic Acid 600 MG CAPS Take 1 capsule by mouth daily.      amphetamine-dextroamphetamine (ADDERALL XR) 20 MG 24 hr capsule TAKE 1 CAPSULE BY MOUTH ONCE DAILY IN THE MORNING. [place]  capsule 0   Ascorbic Acid (VITAMIN C) 1000 MG tablet Take 1,000 mg by mouth daily.     atorvastatin (LIPITOR) 80 MG tablet Take 1 tablet by mouth daily. 90 tablet 1   azelastine (ASTELIN) 0.1 % nasal spray Place 2 sprays into both nostrils 2 (two) times daily. Use in each nostril as directed 90 mL 1   b complex vitamins tablet Take 1 tablet by mouth daily.     BAYER MICROLET LANCETS lancets USE TO TEST BLOOD SUGAR 6 TIMES A DAY 300 each 11   Beclomethasone Dipropionate (QNASL) 80 MCG/ACT AERS Place 2 puffs into the nose daily as needed (allergies). 10.6 g 5   Calcium-Magnesium-Vitamin D (CALCIUM 500 PO) Take 1 tablet by mouth daily.      Continuous Blood Gluc Transmit (DEXCOM G6 TRANSMITTER) MISC Use to monitor blood glucose, replace every 90 days. 1 each 3   dapagliflozin propanediol (FARXIGA) 5 MG TABS tablet Take 1 tablet (5 mg total) by mouth daily before breakfast. 90 tablet 3   famotidine (PEPCID) 40 MG tablet Take 1 tablet by mouth at bedtime.     ferrous sulfate 325 (65 FE) MG tablet Take 1 tablet (325 mg total) by mouth daily with breakfast. 90 tablet 0   glucose blood (CONTOUR NEXT TEST) test strip USE AS INSTRUCTED TO CHECK SUGAR 6 TIMES DAILY 400 strip 3   hydrOXYzine (ATARAX) 10 MG tablet Take 1 tablet by mouth at bedtime as needed. 90 tablet 0   insulin aspart (NOVOLOG) 100 UNIT/ML injection Inject max 50 units daily 50 mL 3   Insulin Lispro-aabc (LYUMJEV) 100 UNIT/ML SOLN USE UP TO 120 UNITS IN THE INSULIN PUMP DAILY 50 mL 1    levocetirizine (XYZAL) 5 MG tablet TAKE 1 TABLET BY MOUTH IN THE EVENING 90 tablet 1   losartan (COZAAR) 50 MG tablet TAKE 1 TABLET BY MOUTH DAILY. 90 tablet 1   Magnesium Oxide 400 MG CAPS Take 1 capsule (400 mg total) by mouth daily. 90 capsule 1   metFORMIN (GLUCOPHAGE) 500 MG tablet Take 2 tablets (1,000 mg total) by mouth 2 (two) times daily before a meal. 360 tablet 0   NON FORMULARY CPAP     omeprazole (PRILOSEC) 40 MG capsule TAKE ONE CAPSULE BY MOUTH TWICE A DAY BEFORE MEALS (TAKE ON AN EMPTY STOMACH 30 MINUTES PRIOR TO A MEAL)     pioglitazone (ACTOS) 45 MG tablet TAKE 1/2 TABLET BY MOUTH ONCE DAILY 45 tablet 2   traZODone (DESYREL) 50 MG tablet TAKE 1 TABLET BY MOUTH AT BEDTIME 90 tablet 1   Vitamin D, Ergocalciferol, (DRISDOL) 1.25 MG (50000 UNIT) CAPS capsule TAKE 1 CAPSULE BY MOUTH EVERY 7 DAYS 12 capsule 0   No current facility-administered medications on file prior to visit.   Allergies  Allergen Reactions   Lisinopril Swelling and Other (See Comments)    Extreme facial swelling causing hospitalization   Shellfish Allergy Anaphylaxis   Zetia [Ezetimibe] Other (See Comments)    ABDOMINAL CRAMPING   Dulaglutide Other (See Comments)   Gabapentin     Other reaction(s): Dizziness   Gabapentin (Once-Daily) Other (See Comments)   Iodine    Penicillins Other (See Comments)    UNSPECIFIED REACTION > Childhood allergy     Cymbalta [Duloxetine Hcl] Other (See Comments)    Severe feet cramps   Family History  Problem Relation Age of Onset   Colon cancer Maternal Grandfather    Colon cancer Paternal Grandfather    Heart disease  Father        Valve replacement; pacemaker   Alcohol abuse Neg Hx    Asthma Neg Hx    Diabetes Neg Hx    Hyperlipidemia Neg Hx    Hypertension Neg Hx    Kidney disease Neg Hx    Stroke Neg Hx    PE: There were no vitals taken for this visit. There is no height or weight on file to calculate BMI.  Wt Readings from Last 3 Encounters:   10/29/21 236 lb (107 kg)  09/03/21 237 lb (107.5 kg)  09/01/21 234 lb (106.1 kg)   Constitutional: overweight, in NAD Eyes: PERRLA, EOMI, no exophthalmos ENT: moist mucous membranes, no thyromegaly, no cervical lymphadenopathy Cardiovascular: RRR, No MRG Respiratory: CTA B Musculoskeletal: no deformities Skin: moist, warm, no rashes Neurological: no tremor with outstretched hands Diabetic Foot Exam - Simple   Simple Foot Form Diabetic Foot exam was performed with the following findings: Yes 11/30/2021  4:25 PM  Visual Inspection No deformities, no ulcerations, no other skin breakdown bilaterally: Yes Sensation Testing Intact to touch and monofilament testing bilaterally: Yes Pulse Check Posterior Tibialis and Dorsalis pulse intact bilaterally: Yes Comments    ASSESSMENT: 1. DM1, insulin-dependent, uncontrolled, without complications - DR - PN  - Barriers to good control: Very busy schedule >> less time to check sugars and eat but But he does a great job with bolusing during the day >> still has a lot of large boluses during the day, sometimes without documented carbs or sugars Lack of sleep >> usually sleeps max 5h a night (!) and tries to catch up in the weekend Reaches home late at night  - sometimes at 1 am >> eats >65% of the daily calories then  2. HL  PLAN:  1. Patient with longstanding, uncontrolled, type 1 diabetes, with high insulin resistance, managed on an insulin pump (switched from Medtronic to t:slim since last visit) with better control.  He is also on oral medication with metformin and TZD and at last visit we also added an SGLT2 inhibitor. -At last visit, reviewing the CGM trends, it appears that the previous 2 weeks were slightly higher than before consistently during the night and also during the first half of the morning, and improving before lunch.  They were quite fluctuating afterwards.  We increased his basal rates at night and strengthen his sensitivity  factor and insulin to carb ratios.  He was also not checking his blood sugars with his glucometer when doing a correction for low blood sugars and I strongly advised him to do so.  He inquired about medications with weight loss as a side effect.  He has tried a GLP-1 receptor agonist in the past but could not tolerate it due to GI side effects.  I suggested Farxiga at that time.  He was able to start it. CGM interpretation: -At today's visit, we reviewed his CGM downloads: It appears that 75% of values are in target range (goal >70%), while 23% are higher than 180 (goal <25%), and 2% are lower than 70 (goal <4%).  The calculated average blood sugar is 152.  The projected HbA1c for the next 3 months (GMI) is 7.0%. -Reviewing the CGM trends, it appears that sugars improved significantly since last visit.  Still have slight increases in blood sugars after meals, but upon questioning and reviewing the pump downloads, it appears that he is injecting NovoLog too late after he already started to eat.  As a consequence, sugars  are dropping after the meal, but mostly still too lower normal values.  We discussed about the importance of bolusing 15 minutes before the meal, but in fact, at today's visit, I suggested to go back from NovoLog to Lyumjev and injected at the start of the meal.  He agrees to do so. -Reviewing his pump settings, he has not made the changes recommended at last visit.  However, due to the improvement in the blood sugars, we can continue with the previous settings, with 2 exceptions: We will change the basal rate from 3 to 4 AM and from 8 to 9 PM more to allow him to have less basal rate changes.  I introduced the settings into the pump for him. -He will occasionally overcorrect low blood sugars.  We discussed that when he is correcting a low blood sugar to follow the values with his glucometer, rather than the CGM.  He is now checking with both. -He was able to start [place] since last visit and  tolerated well.  This may have contributed to the improved blood sugars.  At this point, I advised him to stop Actos. - I advised him to:  Patient Instructions  Please use the following pump settings: - basal: 12 AM: 1.15 3 AM: 1.10 >> 1.15 4 AM: 1.30 6:30 AM: 1.60 8 AM: 1.90 10 AM: 1.65 12 PM: 1.45 4 PM: 1.35  8 pm: 1.60 >> 1.55 9 PM: 1.55 10:30 PM: 1.45 - bolus:  - Insulin to carb ratio: 1:3 - Insulin sensitivity factor:  25  - target: 120-120 - Active insulin time: 3 hours  Switch back to Lyumjev when you run out of NovoLog. Inject Novolog 15 min before meals.  Please continue: - Metformin 1000 mg 2x with meals  - Farxiga 5 mg before breakfast  Try to stop Actos.  Please come back for a follow-up appointment in 4 months.  - we checked his HbA1c: 7.8% (slightly lower, but lower than expected from the CGM) - advised to check sugars at different times of the day - 4x a day, rotating check times - advised for yearly eye exams >> he is UTD - return to clinic in 3-4 months  2. HL -Reviewed latest lipid panel from 12/2020: All fractions at goal: Lab Results  Component Value Date   CHOL 98 12/27/2020   HDL 52 12/27/2020   LDLCALC 45 10/28/2020   LDLDIRECT 87.0 12/02/2016   TRIG 74 12/27/2020   CHOLHDL 3 10/28/2020  -Continues Lipitor 80 mg daily without side effects  Philemon Kingdom, MD PhD [facility] Endocrinology

## 2021-12-24 ENCOUNTER — Other Ambulatory Visit: Payer: Self-pay | Admitting: Internal Medicine

## 2021-12-24 ENCOUNTER — Other Ambulatory Visit (HOSPITAL_COMMUNITY): Payer: Self-pay

## 2021-12-24 DIAGNOSIS — E1065 Type 1 diabetes mellitus with hyperglycemia: Secondary | ICD-10-CM

## 2021-12-24 DIAGNOSIS — F900 Attention-deficit hyperactivity disorder, predominantly inattentive type: Secondary | ICD-10-CM

## 2021-12-24 MED ORDER — METFORMIN HCL 500 MG PO TABS
1000.0000 mg | ORAL_TABLET | Freq: Two times a day (BID) | ORAL | 0 refills | Status: DC
  Filled 2021-12-24: qty 360, 90d supply, fill #0

## 2021-12-24 MED ORDER — AMPHETAMINE-DEXTROAMPHET ER 20 MG PO CP24
ORAL_CAPSULE | ORAL | 0 refills | Status: DC
  Filled 2021-12-24: qty 30, 30d supply, fill #0

## 2021-12-25 ENCOUNTER — Other Ambulatory Visit (HOSPITAL_COMMUNITY): Payer: Self-pay

## 2021-12-28 ENCOUNTER — Other Ambulatory Visit (HOSPITAL_COMMUNITY): Payer: Self-pay

## 2021-12-28 ENCOUNTER — Other Ambulatory Visit: Payer: Self-pay | Admitting: Family Medicine

## 2021-12-28 MED ORDER — VITAMIN D (ERGOCALCIFEROL) 1.25 MG (50000 UNIT) PO CAPS
ORAL_CAPSULE | ORAL | 0 refills | Status: DC
  Filled 2021-12-28: qty 12, 84d supply, fill #0

## 2021-12-28 NOTE — Progress Notes (Unsigned)
Zachary Graham [place] [address] [place] Phone: [phone] Subjective:   Zachary Graham, am serving as a scribe for Dr. Hulan Saas.  I'm seeing this patient by the request  of:  [name], MD  CC: back and neck pain   [id number]  Zachary Graham is a 61 y.o. male coming in with complaint of back and neck pain. OMT on 10/29/2021. Patient states doing well. No new issues.  Medications patient has been prescribed: hydroxyzine Vit D  Taking:         Reviewed prior external information including notes and imaging from previsou exam, outside providers and external EMR if available.   As well as notes that were available from care everywhere and other healthcare systems.  Past medical history, social, surgical and family history all reviewed in electronic medical record.  No pertanent information unless stated regarding to the chief complaint.   Past Medical History:  Diagnosis Date   Diabetes mellitus    GERD (gastroesophageal reflux disease)    Heart murmur    Hyperlipidemia    Hypertension    OSA (obstructive sleep apnea)    Pneumonia     Allergies  Allergen Reactions   Lisinopril Swelling and Other (See Comments)    Extreme facial swelling causing hospitalization   Shellfish Allergy Anaphylaxis   Zetia [Ezetimibe] Other (See Comments)    ABDOMINAL CRAMPING   Dulaglutide Other (See Comments)   Gabapentin     Other reaction(s): Dizziness   Gabapentin (Once-Daily) Other (See Comments)   Iodine    Penicillins Other (See Comments)    UNSPECIFIED REACTION > Childhood allergy     Cymbalta [Duloxetine Hcl] Other (See Comments)    Severe feet cramps     Review of Systems:  No headache, visual changes, nausea, vomiting, diarrhea, constipation, dizziness, abdominal pain, skin rash, fevers, chills, night sweats, weight loss, swollen lymph nodes, body aches, joint swelling, chest pain, shortness of breath, mood changes.  POSITIVE muscle aches  Objective  Blood pressure 122/72, pulse 81, height '5\' 10"'$  (1.778 m), weight 231 lb (104.8 kg), SpO2 97 %.   General: No apparent distress alert and oriented x3 mood and affect normal, dressed appropriately.  HEENT: Pupils equal, extraocular movements intact  Respiratory: Patient's speak in full sentences and does not appear short of breath  Cardiovascular: No lower extremity edema, non tender, no erythema  MSK:  Back mild loss of lordosis noted tightness with SLT   Osteopathic findings  T3 extended rotated and side bent right inhaled rib T9 extended rotated and side bent left L3 flexed rotated and side bent left  Sacrum right on right    Assessment and Plan:  Low back pain Chronic problem, with discomfort noted.  Starting to workout  Discussed which activities to do .  RTC in 6 weeks     Nonallopathic problems  Decision today to treat with OMT was based on Physical Exam  After verbal consent patient was treated with HVLA, ME, FPR techniques in  thoracic, lumbar, and sacral  areas  Patient tolerated the procedure well with improvement in symptoms  Patient given exercises, stretches and lifestyle modifications  See medications in patient instructions if given  Patient will follow up in 4-8 weeks    The above documentation has been reviewed and is accurate and complete [name], DO          Note: This dictation was prepared with Dragon dictation along with  smaller phrase technology. Any transcriptional errors that result from this process are unintentional.

## 2021-12-29 ENCOUNTER — Ambulatory Visit (INDEPENDENT_AMBULATORY_CARE_PROVIDER_SITE_OTHER): Payer: 59 | Admitting: Family Medicine

## 2021-12-29 ENCOUNTER — Encounter: Payer: Self-pay | Admitting: Family Medicine

## 2021-12-29 VITALS — BP 122/72 | HR 81 | Ht 70.0 in | Wt 231.0 lb

## 2021-12-29 DIAGNOSIS — M9902 Segmental and somatic dysfunction of thoracic region: Secondary | ICD-10-CM

## 2021-12-29 DIAGNOSIS — M9903 Segmental and somatic dysfunction of lumbar region: Secondary | ICD-10-CM | POA: Diagnosis not present

## 2021-12-29 DIAGNOSIS — M545 Low back pain, unspecified: Secondary | ICD-10-CM

## 2021-12-29 DIAGNOSIS — M9904 Segmental and somatic dysfunction of sacral region: Secondary | ICD-10-CM | POA: Diagnosis not present

## 2021-12-29 DIAGNOSIS — G8929 Other chronic pain: Secondary | ICD-10-CM

## 2021-12-29 NOTE — Assessment & Plan Note (Signed)
Chronic problem, with discomfort noted.  Starting to workout  Discussed which activities to do .  RTC in 6 weeks

## 2022-01-04 ENCOUNTER — Encounter: Payer: Self-pay | Admitting: Family Medicine

## 2022-01-06 ENCOUNTER — Other Ambulatory Visit (HOSPITAL_COMMUNITY): Payer: Self-pay

## 2022-01-07 ENCOUNTER — Telehealth: Payer: 59 | Admitting: Physician Assistant

## 2022-01-07 ENCOUNTER — Other Ambulatory Visit (HOSPITAL_COMMUNITY): Payer: Self-pay

## 2022-01-07 DIAGNOSIS — Z91013 Allergy to seafood: Secondary | ICD-10-CM | POA: Diagnosis not present

## 2022-01-07 DIAGNOSIS — M545 Low back pain, unspecified: Secondary | ICD-10-CM | POA: Diagnosis not present

## 2022-01-07 DIAGNOSIS — K219 Gastro-esophageal reflux disease without esophagitis: Secondary | ICD-10-CM | POA: Diagnosis not present

## 2022-01-07 DIAGNOSIS — J300 Vasomotor rhinitis: Secondary | ICD-10-CM | POA: Diagnosis not present

## 2022-01-07 MED ORDER — MELOXICAM 15 MG PO TABS
15.0000 mg | ORAL_TABLET | Freq: Every day | ORAL | 0 refills | Status: DC
  Filled 2022-01-07: qty 15, 15d supply, fill #0

## 2022-01-07 MED ORDER — CYCLOBENZAPRINE HCL 10 MG PO TABS
10.0000 mg | ORAL_TABLET | Freq: Three times a day (TID) | ORAL | 0 refills | Status: AC | PRN
  Filled 2022-01-07: qty 30, 10d supply, fill #0

## 2022-01-07 NOTE — Progress Notes (Signed)
The patient no-showed for appointment despite this provider sending direct link, reaching out via phone with no response and waiting for at least 10 minutes from appointment time for patient to join. They will be marked as a NS for this appointment/time.  ? ?Zachary Graham Cody [name], PA-C ? ? ? ?

## 2022-01-07 NOTE — Progress Notes (Signed)
Virtual Visit Consent   DONA KLEMANN, you are scheduled for a virtual visit with a [facility] provider today. Just as with appointments in the office, your consent must be obtained to participate. Your consent will be active for this visit and any virtual visit you may have with one of our providers in the next 365 days. If you have a MyChart account, a copy of this consent can be sent to you electronically.  As this is a virtual visit, video technology does not allow for your provider to perform a traditional examination. This may limit your provider's ability to fully assess your condition. If your provider identifies any concerns that need to be evaluated in person or the need to arrange testing (such as labs, EKG, etc.), we will make arrangements to do so. Although advances in technology are sophisticated, we cannot ensure that it will always work on either your end or our end. If the connection with a video visit is poor, the visit may have to be switched to a telephone visit. With either a video or telephone visit, we are not always able to ensure that we have a secure connection.  By engaging in this virtual visit, you consent to the provision of healthcare and authorize for your insurance to be billed (if applicable) for the services provided during this visit. Depending on your insurance coverage, you may receive a charge related to this service.  I need to obtain your verbal consent now. Are you willing to proceed with your visit today? Zachary Graham has provided verbal consent on 01/07/2022 for a virtual visit (video or telephone). [name], [place]  Date: 01/07/2022 7:28 PM  Virtual Visit via Video Note   I, [name], connected with  Zachary Graham  ([id number], [birth year]) on 01/07/22 at  7:30 PM EDT by a video-enabled telemedicine application and verified that I am speaking with the correct person using two identifiers.  Location: Patient: Virtual Visit Location  Patient: Home Provider: Virtual Visit Location Provider: Home Office   I discussed the limitations of evaluation and management by telemedicine and the availability of in person appointments. The patient expressed understanding and agreed to proceed.    History of Present Illness: Zachary Graham is a 61 y.o. who identifies as a male who was assigned male at birth, and is being seen today for low back pain starting last Friday after he bent over to pick up a piece of paper at work.  He felt something tweak in his lower back.  Notes pain initially more significant but he was able to rest over the weekend with some improvement in level of pain.  Pain is mainly when going from a sitting to a standing position and with ambulating.  Does not bother him while he is sitting or while laying down.  Pain is bilateral but more so left-sided.  Denies radiation down into the legs.  Denies saddle anesthesia.  When pain is present it can get up to a 7-8 out of 10.  Has taken Motrin OTC with some relief.  Does have history of degenerative disc disease including history of cervical spine surgery.  Is followed by sports medicine who we contacted a couple of days ago, but has not been able to to get in to see his provider.   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 ([place]) 01/06/2017   Herniation of cervical intervertebral disc with radiculopathy 01/04/2017   Current mild episode of major depressive disorder  without prior episode ([place]) 12/02/2016   Diabetic neuropathy ([place]) 03/01/2016   Obesity, Class II, BMI 35.0-39.9, with comorbidity (see actual BMI) 04/24/2015   Type 1 diabetes mellitus with hyperglycemia, with long-term current use of insulin ([place]) 04/04/2015   Patellofemoral stress syndrome of left knee 03/19/2015   Allergic rhinitis due to pollen 11/14/2014   Equinus deformity of foot, acquired 05/29/2014   ADHD (attention deficit hyperactivity disorder), inattentive type 10/24/2013   Vitamin D deficiency 02/28/2013   Pulmonic stenosis 05/25/2012   Pure hyperglyceridemia 05/25/2012   Hyperlipidemia with target LDL less than 100 09/20/2011   OSA (obstructive sleep apnea) 09/20/2011   BPH (benign prostatic hyperplasia) 09/20/2011   Routine general medical examination at a health care facility 09/20/2011    Allergies:  Allergies  Allergen Reactions   Lisinopril Swelling and Other (See Comments)    Extreme facial swelling causing hospitalization   Shellfish Allergy Anaphylaxis   Zetia [Ezetimibe] Other (See Comments)    ABDOMINAL CRAMPING   Dulaglutide Other (See Comments)   Gabapentin     Other reaction(s): Dizziness   Gabapentin (Once-Daily) Other (See Comments)   Iodine    Penicillins Other (See Comments)    UNSPECIFIED REACTION > Childhood allergy     Cymbalta [Duloxetine Hcl] Other (See Comments)    Severe feet cramps   Medications:  Current Outpatient Medications:    cyclobenzaprine (FLEXERIL) 10 MG tablet, Take 1 tablet (10 mg total) by mouth 3 (three) times daily as needed for muscle spasms., Disp: 30 tablet, Rfl: 0   meloxicam (MOBIC) 15 MG tablet, Take 1 tablet (15 mg total) by mouth daily., Disp: 15 tablet, Rfl: 0   Alpha-Lipoic Acid 600 MG CAPS, Take 1 capsule by mouth daily. , Disp: , Rfl:    amphetamine-dextroamphetamine (ADDERALL XR) 20 MG 24 hr capsule, TAKE 1 CAPSULE BY MOUTH ONCE DAILY IN THE MORNING., Disp: 30 capsule, Rfl: 0   Ascorbic Acid (VITAMIN C) 1000  MG tablet, Take 1,000 mg by mouth daily., Disp: , Rfl:    atorvastatin (LIPITOR) 80 MG tablet, Take 1 tablet by mouth daily., Disp: 90 tablet, Rfl: 1   azelastine (ASTELIN) 0.1 % nasal spray, Place 2 sprays into both nostrils 2 (two) times daily. Use in each nostril as directed, Disp: 90 mL, Rfl: 1   b complex vitamins tablet, Take 1 tablet by mouth daily., Disp: , Rfl:    BAYER MICROLET LANCETS lancets, USE TO TEST BLOOD SUGAR 6 TIMES A DAY, Disp: 300 each, Rfl: 11   Beclomethasone Dipropionate (QNASL) 80 MCG/ACT AERS, Place 2 puffs into the nose daily as needed (allergies)., Disp: 10.6 g, Rfl: 5   Calcium-Magnesium-Vitamin D (CALCIUM 500 PO), Take 1 tablet by mouth daily. , Disp: , Rfl:    Continuous Blood Gluc Transmit (DEXCOM G6 TRANSMITTER) MISC, Use to monitor blood glucose, replace every 90 days., Disp: 1 each, Rfl: 3   dapagliflozin propanediol (FARXIGA) 5 MG TABS tablet, Take 1 tablet (5 mg total) by mouth daily before breakfast., Disp: 90  tablet, Rfl: 3   famotidine (PEPCID) 40 MG tablet, Take 1 tablet by mouth at bedtime., Disp: , Rfl:    ferrous sulfate 325 (65 FE) MG tablet, Take 1 tablet (325 mg total) by mouth daily with breakfast., Disp: 90 tablet, Rfl: 0   glucose blood (CONTOUR NEXT TEST) test strip, USE AS INSTRUCTED TO CHECK SUGAR 6 TIMES DAILY, Disp: 400 strip, Rfl: 3   hydrOXYzine (ATARAX) 10 MG tablet, Take 1 tablet by mouth at bedtime as needed., Disp: 90 tablet, Rfl: 0   insulin aspart (NOVOLOG) 100 UNIT/ML injection, Inject max 50 units daily, Disp: 50 mL, Rfl: 3   Insulin Lispro-aabc (LYUMJEV) 100 UNIT/ML SOLN, USE UP TO 120 UNITS IN THE INSULIN PUMP DAILY, Disp: 50 mL, Rfl: 1   levocetirizine (XYZAL) 5 MG tablet, TAKE 1 TABLET BY MOUTH IN THE EVENING, Disp: 90 tablet, Rfl: 1   losartan (COZAAR) 50 MG tablet, TAKE 1 TABLET BY MOUTH DAILY., Disp: 90 tablet, Rfl: 1   Magnesium Oxide 400 MG CAPS, Take 1 capsule (400 mg total) by mouth daily., Disp: 90 capsule, Rfl: 1    metFORMIN (GLUCOPHAGE) 500 MG tablet, Take 2 tablets (1,000 mg total) by mouth 2 (two) times daily before a meal., Disp: 360 tablet, Rfl: 0   NON FORMULARY, CPAP, Disp: , Rfl:    omeprazole (PRILOSEC) 40 MG capsule, TAKE ONE CAPSULE BY MOUTH TWICE A DAY BEFORE MEALS (TAKE ON AN EMPTY STOMACH 30 MINUTES PRIOR TO A MEAL), Disp: , Rfl:    traZODone (DESYREL) 50 MG tablet, TAKE 1 TABLET BY MOUTH AT BEDTIME, Disp: 90 tablet, Rfl: 1   Vitamin D, Ergocalciferol, (DRISDOL) 1.25 MG (50000 UNIT) CAPS capsule, TAKE 1 CAPSULE BY MOUTH EVERY 7 DAYS, Disp: 12 capsule, Rfl: 0  Observations/Objective: Patient is well-developed, well-nourished in no acute distress.  Resting comfortably at home.  Head is normocephalic, atraumatic.  No labored breathing. Speech is clear and coherent with logical content.  Patient is alert and oriented at baseline.   Assessment and Plan: 1. Acute bilateral low back pain without sciatica - meloxicam (MOBIC) 15 MG tablet; Take 1 tablet (15 mg total) by mouth daily.  Dispense: 15 tablet; Refill: 0 - cyclobenzaprine (FLEXERIL) 10 MG tablet; Take 1 tablet (10 mg total) by mouth 3 (three) times daily as needed for muscle spasms.  Dispense: 30 tablet; Refill: 0  Atraumatic.  Seems more related to muscle strain with some associated tension.  Avoid heavy lifting and overexertion.  Gentle stretching may be beneficial.  Rx meloxicam once daily with food.  Tylenol for breakthrough pain.  Rx Flexeril to use up to 3 times daily as needed.  We will send copy of note to sports medicine Dr.  Gayla Doss patient to schedule follow-up.  Follow Up Instructions: I discussed the assessment and treatment plan with the patient. The patient was provided an opportunity to ask questions and all were answered. The patient agreed with the plan and demonstrated an understanding of the instructions.  A copy of instructions were sent to the patient via MyChart unless otherwise noted below.   The patient was  advised to call back or seek an in-person evaluation if the symptoms worsen or if the condition fails to improve as anticipated.  Time:  I spent 10 minutes with the patient via telehealth technology discussing the above problems/concerns.    [name], PA-C

## 2022-01-07 NOTE — Patient Instructions (Signed)
Zachary Graham, thank you for joining [name], PA-C for today's virtual visit.  While this provider is not your primary care provider (PCP), if your PCP is located in our provider database this encounter information will be shared with them immediately following your visit.  Consent: (Patient) Zachary Graham provided verbal consent for this virtual visit at the beginning of the encounter.  Current Medications:  Current Outpatient Medications:    Alpha-Lipoic Acid 600 MG CAPS, Take 1 capsule by mouth daily. , Disp: , Rfl:    amphetamine-dextroamphetamine (ADDERALL XR) 20 MG 24 hr capsule, TAKE 1 CAPSULE BY MOUTH ONCE DAILY IN THE MORNING., Disp: 30 capsule, Rfl: 0   Ascorbic Acid (VITAMIN C) 1000 MG tablet, Take 1,000 mg by mouth daily., Disp: , Rfl:    atorvastatin (LIPITOR) 80 MG tablet, Take 1 tablet by mouth daily., Disp: 90 tablet, Rfl: 1   azelastine (ASTELIN) 0.1 % nasal spray, Place 2 sprays into both nostrils 2 (two) times daily. Use in each nostril as directed, Disp: 90 mL, Rfl: 1   b complex vitamins tablet, Take 1 tablet by mouth daily., Disp: , Rfl:    BAYER MICROLET LANCETS lancets, USE TO TEST BLOOD SUGAR 6 TIMES A DAY, Disp: 300 each, Rfl: 11   Beclomethasone Dipropionate (QNASL) 80 MCG/ACT AERS, Place 2 puffs into the nose daily as needed (allergies)., Disp: 10.6 g, Rfl: 5   Calcium-Magnesium-Vitamin D (CALCIUM 500 PO), Take 1 tablet by mouth daily. , Disp: , Rfl:    Continuous Blood Gluc Transmit (DEXCOM G6 TRANSMITTER) MISC, Use to monitor blood glucose, replace every 90 days., Disp: 1 each, Rfl: 3   dapagliflozin propanediol (FARXIGA) 5 MG TABS tablet, Take 1 tablet (5 mg total) by mouth daily before breakfast., Disp: 90 tablet, Rfl: 3   famotidine (PEPCID) 40 MG tablet, Take 1 tablet by mouth at bedtime., Disp: , Rfl:    ferrous sulfate 325 (65 FE) MG tablet, Take 1 tablet (325 mg total) by mouth daily with breakfast., Disp: 90 tablet, Rfl: 0   glucose blood  (CONTOUR NEXT TEST) test strip, USE AS INSTRUCTED TO CHECK SUGAR 6 TIMES DAILY, Disp: 400 strip, Rfl: 3   hydrOXYzine (ATARAX) 10 MG tablet, Take 1 tablet by mouth at bedtime as needed., Disp: 90 tablet, Rfl: 0   insulin aspart (NOVOLOG) 100 UNIT/ML injection, Inject max 50 units daily, Disp: 50 mL, Rfl: 3   Insulin Lispro-aabc (LYUMJEV) 100 UNIT/ML SOLN, USE UP TO 120 UNITS IN THE INSULIN PUMP DAILY, Disp: 50 mL, Rfl: 1   levocetirizine (XYZAL) 5 MG tablet, TAKE 1 TABLET BY MOUTH IN THE EVENING, Disp: 90 tablet, Rfl: 1   losartan (COZAAR) 50 MG tablet, TAKE 1 TABLET BY MOUTH DAILY., Disp: 90 tablet, Rfl: 1   Magnesium Oxide 400 MG CAPS, Take 1 capsule (400 mg total) by mouth daily., Disp: 90 capsule, Rfl: 1   metFORMIN (GLUCOPHAGE) 500 MG tablet, Take 2 tablets (1,000 mg total) by mouth 2 (two) times daily before a meal., Disp: 360 tablet, Rfl: 0   NON FORMULARY, CPAP, Disp: , Rfl:    omeprazole (PRILOSEC) 40 MG capsule, TAKE ONE CAPSULE BY MOUTH TWICE A DAY BEFORE MEALS (TAKE ON AN EMPTY STOMACH 30 MINUTES PRIOR TO A MEAL), Disp: , Rfl:    traZODone (DESYREL) 50 MG tablet, TAKE 1 TABLET BY MOUTH AT BEDTIME, Disp: 90 tablet, Rfl: 1   Vitamin D, Ergocalciferol, (DRISDOL) 1.25 MG (50000 UNIT) CAPS capsule, TAKE 1 CAPSULE BY  MOUTH EVERY 7 DAYS, Disp: 12 capsule, Rfl: 0   Medications ordered in this encounter:  No orders of the defined types were placed in this encounter.    *If you need refills on other medications prior to your next appointment, please contact your pharmacy*  Follow-Up: Call back or seek an in-person evaluation if the symptoms worsen or if the condition fails to improve as anticipated.  Other Instructions Please avoid heavy lifting and overexertion. Please take the meloxicam once daily with food.  You can use OTC Tylenol along with this. Use the Flexeril up to 3 times daily as directed, but do not drive right after taking this medication as it can sometimes make you  drowsy. Apply heating pad to the area in 15-minute intervals throughout the day. Gentle stretching may be beneficial. Please contact your sports medicine doctor to schedule follow-up given your history. If anything worsens despite treatment, or things are not resolving, please seek an in-person evaluation.  Please do not delay care.   If you have been instructed to have an in-person evaluation today at a local Urgent Care facility, please use the link below. It will take you to a list of all of our available [facility], including address, phone number and hours of operation. Please do not delay care.  [facility]  If you or a family member do not have a primary care provider, use the link below to schedule a visit and establish care. When you choose a [facility] primary care physician or advanced practice provider, you gain a long-term partner in health. Find a Primary Care Provider  Learn more about [facility]'s in-office and virtual care options: [facility]

## 2022-01-08 ENCOUNTER — Other Ambulatory Visit (HOSPITAL_COMMUNITY): Payer: Self-pay

## 2022-01-14 ENCOUNTER — Ambulatory Visit (INDEPENDENT_AMBULATORY_CARE_PROVIDER_SITE_OTHER): Payer: 59

## 2022-01-14 ENCOUNTER — Encounter: Payer: 59 | Admitting: Internal Medicine

## 2022-01-14 ENCOUNTER — Ambulatory Visit: Payer: 59 | Admitting: Nurse Practitioner

## 2022-01-14 VITALS — BP 138/64 | HR 57 | Temp 98.4°F | Ht 70.0 in | Wt 237.4 lb

## 2022-01-14 DIAGNOSIS — K219 Gastro-esophageal reflux disease without esophagitis: Secondary | ICD-10-CM | POA: Insufficient documentation

## 2022-01-14 DIAGNOSIS — J300 Vasomotor rhinitis: Secondary | ICD-10-CM | POA: Insufficient documentation

## 2022-01-14 DIAGNOSIS — Z91013 Allergy to seafood: Secondary | ICD-10-CM | POA: Insufficient documentation

## 2022-01-14 DIAGNOSIS — M5441 Lumbago with sciatica, right side: Secondary | ICD-10-CM

## 2022-01-14 DIAGNOSIS — F909 Attention-deficit hyperactivity disorder, unspecified type: Secondary | ICD-10-CM | POA: Insufficient documentation

## 2022-01-14 DIAGNOSIS — M545 Low back pain, unspecified: Secondary | ICD-10-CM | POA: Diagnosis not present

## 2022-01-14 DIAGNOSIS — M5442 Lumbago with sciatica, left side: Secondary | ICD-10-CM

## 2022-01-14 DIAGNOSIS — R1312 Dysphagia, oropharyngeal phase: Secondary | ICD-10-CM | POA: Insufficient documentation

## 2022-01-14 DIAGNOSIS — T464X5A Adverse effect of angiotensin-converting-enzyme inhibitors, initial encounter: Secondary | ICD-10-CM | POA: Insufficient documentation

## 2022-01-14 DIAGNOSIS — Z7729 Contact with and (suspected ) exposure to other hazardous substances: Secondary | ICD-10-CM | POA: Insufficient documentation

## 2022-01-14 DIAGNOSIS — D3132 Benign neoplasm of left choroid: Secondary | ICD-10-CM | POA: Insufficient documentation

## 2022-01-14 DIAGNOSIS — L821 Other seborrheic keratosis: Secondary | ICD-10-CM | POA: Insufficient documentation

## 2022-01-14 DIAGNOSIS — H40003 Preglaucoma, unspecified, bilateral: Secondary | ICD-10-CM | POA: Insufficient documentation

## 2022-01-14 DIAGNOSIS — E1065 Type 1 diabetes mellitus with hyperglycemia: Secondary | ICD-10-CM | POA: Insufficient documentation

## 2022-01-14 NOTE — Progress Notes (Signed)
   Established Patient Office Visit  Subjective   Patient ID: Zachary Graham, male    DOB: 02-19-1961  Age: 61 y.o. MRN: [id number]  Chief Complaint  Patient presents with   Back Pain    About two weeks, happened when picking something up off the floor Felt something tourn  Standing and walking that when they feel the pain, pain radiating to the hip area    Symptoms started acutely 2 weeks ago after bending down to pick up a piece of paper. He is experiencing pain in his lower back that radiates to both hips, this is triggered by standing up and walking but is not painful upon standing up and being still nor by laying down. Prolonged sitting will also elicit the pain but not as severely. Pain is described as aching. 2-8/10 in intensity. No bowel/bladder incontinence, no weakness, no sensory changes. Has history of slipped disc in the past. Follows with Sports medicine for this. Is currently using flexeril, meloxicam, and heat with improvement in his pain.     ROS: see hpi    Objective:     BP 138/64 (BP Location: Right Arm, Patient Position: Sitting, Cuff Size: Large)   Pulse (!) 57   Temp 98.4 F (36.9 C) (Oral)   Ht '5\' 10"'$  (1.778 m)   Wt 237 lb 6 oz (107.7 kg)   SpO2 97%   BMI 34.06 kg/m    Physical Exam Constitutional:      General: He is not in acute distress.    Appearance: Normal appearance.  HENT:     Head: Normocephalic and atraumatic.  Pulmonary:     Effort: Pulmonary effort is normal.  Musculoskeletal:     Cervical back: Normal range of motion and neck supple.     Lumbar back: Tenderness and bony tenderness present. Decreased range of motion (due to pain). Negative right straight leg raise test and negative left straight leg raise test.  Neurological:     General: No focal deficit present.     Mental Status: He is alert.     Gait: Gait normal.  Psychiatric:        Mood and Affect: Mood normal.        Behavior: Behavior normal.        Thought Content:  Thought content normal.        Judgment: Judgment normal.      No results found for any visits on 01/14/22.    The ASCVD Risk score (Arnett DK, et al., 2019) failed to calculate for the following reasons:   The valid total cholesterol range is 130 to 320 mg/dL    Assessment & Plan:   Problem List Items Addressed This Visit       Other   Low back pain - Primary    Acute, no red flags noted on exam today however due to history of slipped disc will get xray for evaluation. Recommend he continue use of flexeril, meloxicam, and heat as needed. Educated to call 911 if he experiences sensory loss/weakness in lower extremities, or new bowel/bladder incontinence. He reports his understanding, recommend earlier follow-up with Dr. Tamala Julian, patient reports understanding and will see if he can get in sooner for evaluation.       Relevant Orders   DG Lumbar Spine Complete    Return as scheduled.    Ailene Ards, NP

## 2022-01-14 NOTE — Assessment & Plan Note (Signed)
Acute, no red flags noted on exam today however due to history of slipped disc will get xray for evaluation. Recommend he continue use of flexeril, meloxicam, and heat as needed. Educated to call 911 if he experiences sensory loss/weakness in lower extremities, or new bowel/bladder incontinence. He reports his understanding, recommend earlier follow-up with Dr. Tamala Julian, patient reports understanding and will see if he can get in sooner for evaluation.

## 2022-01-20 ENCOUNTER — Encounter: Payer: Self-pay | Admitting: Family Medicine

## 2022-01-20 ENCOUNTER — Ambulatory Visit (INDEPENDENT_AMBULATORY_CARE_PROVIDER_SITE_OTHER): Payer: 59 | Admitting: Family Medicine

## 2022-01-20 VITALS — BP 124/70 | HR 92 | Ht 70.0 in | Wt 234.0 lb

## 2022-01-20 DIAGNOSIS — M545 Low back pain, unspecified: Secondary | ICD-10-CM

## 2022-01-20 DIAGNOSIS — G8929 Other chronic pain: Secondary | ICD-10-CM

## 2022-01-20 DIAGNOSIS — M9903 Segmental and somatic dysfunction of lumbar region: Secondary | ICD-10-CM | POA: Diagnosis not present

## 2022-01-20 MED ORDER — KETOROLAC TROMETHAMINE 60 MG/2ML IM SOLN
60.0000 mg | Freq: Once | INTRAMUSCULAR | Status: AC
  Administered 2022-01-20: 60 mg via INTRAMUSCULAR

## 2022-01-20 NOTE — Assessment & Plan Note (Signed)
Worsening low back pain again.  Did have x-rays of did not show any significant changes.  Does have some degenerative disc disease and could have some radicular symptoms as well.  We discussed posture and ergonomics otherwise.  If worsening symptoms advanced imaging will be warranted.  Toradol injection given today but held on the Depo-Medrol.  Follow-up again in 6 to 8 weeks

## 2022-01-20 NOTE — Patient Instructions (Signed)
Injection in backside today Exercises Avoid extension See me as scheduled

## 2022-01-20 NOTE — Progress Notes (Signed)
[facility] [place] Phone: [phone] Subjective:    I'm seeing this patient by the request  of:  [name], MD  CC: back and neck pain   TTS:[id number]  Zachary Graham is a 61 y.o. male coming in with complaint of back and neck pain. OMT 12/29/2021. Ten days ago, patient states that he bent over to pick up a piece of paper from the ground and felt sharp stabbing pain in L SI joint. Pain lasted throughout weekend. Pain improves if he lies down or is sitting. Standing and walking increases pressure in back and L lateral hip and groin. Using heat, meloxicam and mm relaxer. Pain is improving but still present.   Medications patient has been prescribed: Hydroxizine  Taking:         Reviewed prior external information including notes and imaging from previsou exam, outside providers and external EMR if available.   As well as notes that were available from care everywhere and other healthcare systems.  Past medical history, social, surgical and family history all reviewed in electronic medical record.  No pertanent information unless stated regarding to the chief complaint.   Past Medical History:  Diagnosis Date   Diabetes mellitus    GERD (gastroesophageal reflux disease)    Heart murmur    Hyperlipidemia    Hypertension    OSA (obstructive sleep apnea)    Pneumonia     Allergies  Allergen Reactions   Lisinopril Swelling and Other (See Comments)    Extreme facial swelling causing hospitalization   Shellfish Allergy Anaphylaxis   Zetia [Ezetimibe] Other (See Comments)    ABDOMINAL CRAMPING   Dulaglutide Other (See Comments)   Gabapentin     Other reaction(s): Dizziness   Gabapentin (Once-Daily) Other (See Comments)   Iodine    Penicillins Other (See Comments)    UNSPECIFIED REACTION > Childhood allergy     Cymbalta [Duloxetine Hcl] Other (See Comments)    Severe feet cramps     Review of  Systems:  No headache, visual changes, nausea, vomiting, diarrhea, constipation, dizziness, abdominal pain, skin rash, fevers, chills, night sweats, weight loss, swollen lymph nodes, body aches, joint swelling, chest pain, shortness of breath, mood changes. POSITIVE muscle aches  Objective  Blood pressure 124/70, pulse 92, height '5\' 10"'$  (1.778 m), weight 234 lb (106.1 kg), SpO2 95 %.   General: No apparent distress alert and oriented x3 mood and affect normal, dressed appropriately.  HEENT: Pupils equal, extraocular movements intact  Respiratory: Patient's speak in full sentences and does not appear short of breath  Cardiovascular: No lower extremity edema, non tender, no erythema  Gait MSK:  Back back exam does have significant loss of lordosis.  Tight is noted at the hip flexor on the left greater than the right.  Mild radicular symptoms into the calf with straight leg test symptoms.  5 out of 5 strength though noted.  Patient does have difficulty going from a seated to standing position.  Worsening pain with extension of the back.        Assessment and Plan:  Low back pain Worsening low back pain again.  Did have x-rays of did not show any significant changes.  Does have some degenerative disc disease and could have some radicular symptoms as well.  We discussed posture and ergonomics otherwise.  If worsening symptoms advanced imaging will be warranted.  Toradol injection given today but held on the Depo-Medrol.  Follow-up again in 6 to 8 weeks      The above documentation has been reviewed and is accurate and complete [name], DO         Note: This dictation was prepared with Dragon dictation along with smaller phrase technology. Any transcriptional errors that result from this process are unintentional.

## 2022-01-25 ENCOUNTER — Encounter: Payer: Self-pay | Admitting: Family Medicine

## 2022-01-25 ENCOUNTER — Other Ambulatory Visit (HOSPITAL_COMMUNITY): Payer: Self-pay

## 2022-01-25 ENCOUNTER — Other Ambulatory Visit: Payer: Self-pay | Admitting: Internal Medicine

## 2022-01-25 DIAGNOSIS — F900 Attention-deficit hyperactivity disorder, predominantly inattentive type: Secondary | ICD-10-CM

## 2022-01-25 MED ORDER — AMPHETAMINE-DEXTROAMPHET ER 20 MG PO CP24
ORAL_CAPSULE | ORAL | 0 refills | Status: DC
  Filled 2022-01-25: qty 30, 30d supply, fill #0

## 2022-01-26 ENCOUNTER — Other Ambulatory Visit (HOSPITAL_COMMUNITY): Payer: Self-pay

## 2022-01-27 ENCOUNTER — Encounter: Payer: Self-pay | Admitting: Internal Medicine

## 2022-01-27 ENCOUNTER — Ambulatory Visit (INDEPENDENT_AMBULATORY_CARE_PROVIDER_SITE_OTHER): Payer: 59 | Admitting: Internal Medicine

## 2022-01-27 VITALS — BP 132/72 | HR 86 | Temp 98.1°F | Resp 16 | Ht 70.0 in | Wt 234.0 lb

## 2022-01-27 DIAGNOSIS — E1065 Type 1 diabetes mellitus with hyperglycemia: Secondary | ICD-10-CM

## 2022-01-27 DIAGNOSIS — Z0000 Encounter for general adult medical examination without abnormal findings: Secondary | ICD-10-CM

## 2022-01-27 DIAGNOSIS — Z23 Encounter for immunization: Secondary | ICD-10-CM | POA: Diagnosis not present

## 2022-01-27 DIAGNOSIS — E785 Hyperlipidemia, unspecified: Secondary | ICD-10-CM | POA: Diagnosis not present

## 2022-01-27 DIAGNOSIS — D508 Other iron deficiency anemias: Secondary | ICD-10-CM

## 2022-01-27 DIAGNOSIS — E781 Pure hyperglyceridemia: Secondary | ICD-10-CM | POA: Diagnosis not present

## 2022-01-27 DIAGNOSIS — I10 Essential (primary) hypertension: Secondary | ICD-10-CM | POA: Diagnosis not present

## 2022-01-27 LAB — MICROALBUMIN / CREATININE URINE RATIO
Creatinine,U: 70.6 mg/dL
Microalb Creat Ratio: 1 mg/g (ref 0.0–30.0)
Microalb, Ur: <0.7 mg/dL (ref 0.0–1.9)

## 2022-01-27 NOTE — Progress Notes (Signed)
Subjective:  Patient ID: Zachary Graham, male    DOB: Dec 28, 1960  Age: 61 y.o. MRN: [id number]  CC: Annual Exam, Anemia, Hypertension, and Hyperlipidemia   HPI Zachary Graham presents for a CPX and f/up -   He walks about 5 miles per day.  He denies chest pain, shortness of breath, diaphoresis, palpitations, or edema.  Outpatient Medications Prior to Visit  Medication Sig Dispense Refill   Alpha-Lipoic Acid 600 MG CAPS Take 1 capsule by mouth daily.      amphetamine-dextroamphetamine (ADDERALL XR) 20 MG 24 hr capsule TAKE 1 CAPSULE BY MOUTH ONCE DAILY IN THE MORNING. 30 capsule 0   Ascorbic Acid (VITAMIN C) 1000 MG tablet Take 1,000 mg by mouth daily.     atorvastatin (LIPITOR) 80 MG tablet Take 1 tablet by mouth daily. 90 tablet 1   azelastine (ASTELIN) 0.1 % nasal spray Place 2 sprays into both nostrils 2 (two) times daily. Use in each nostril as directed 90 mL 1   b complex vitamins tablet Take 1 tablet by mouth daily.     BAYER MICROLET LANCETS lancets USE TO TEST BLOOD SUGAR 6 TIMES A DAY 300 each 11   Beclomethasone Dipropionate (QNASL) 80 MCG/ACT AERS Place 2 puffs into the nose daily as needed (allergies). 10.6 g 5   Calcium-Magnesium-Vitamin D (CALCIUM 500 PO) Take 1 tablet by mouth daily.      Continuous Blood Gluc Transmit (DEXCOM G6 TRANSMITTER) MISC Use to monitor blood glucose, replace every 90 days. 1 each 3   cyclobenzaprine (FLEXERIL) 10 MG tablet Take 1 tablet (10 mg total) by mouth 3 (three) times daily as needed for muscle spasms. 30 tablet 0   dapagliflozin propanediol (FARXIGA) 5 MG TABS tablet Take 1 tablet (5 mg total) by mouth daily before breakfast. 90 tablet 3   ferrous sulfate 325 (65 FE) MG tablet Take 1 tablet (325 mg total) by mouth daily with breakfast. 90 tablet 0   glucose blood (CONTOUR NEXT TEST) test strip USE AS INSTRUCTED TO CHECK SUGAR 6 TIMES DAILY 400 strip 3   hydrOXYzine (ATARAX) 10 MG tablet Take 1 tablet by mouth at bedtime as needed. 90 tablet  0   insulin aspart (NOVOLOG) 100 UNIT/ML injection Inject max 50 units daily 50 mL 3   Insulin Lispro-aabc (LYUMJEV) 100 UNIT/ML SOLN USE UP TO 120 UNITS IN THE INSULIN PUMP DAILY 50 mL 1   levocetirizine (XYZAL) 5 MG tablet TAKE 1 TABLET BY MOUTH IN THE EVENING 90 tablet 1   losartan (COZAAR) 50 MG tablet TAKE 1 TABLET BY MOUTH DAILY. 90 tablet 1   Magnesium Oxide 400 MG CAPS Take 1 capsule (400 mg total) by mouth daily. 90 capsule 1   meloxicam (MOBIC) 15 MG tablet Take 1 tablet (15 mg total) by mouth daily. 15 tablet 0   metFORMIN (GLUCOPHAGE) 500 MG tablet Take 2 tablets (1,000 mg total) by mouth 2 (two) times daily before a meal. 360 tablet 0   NON FORMULARY CPAP     traZODone (DESYREL) 50 MG tablet TAKE 1 TABLET BY MOUTH AT BEDTIME 90 tablet 1   Vitamin D, Ergocalciferol, (DRISDOL) 1.25 MG (50000 UNIT) CAPS capsule TAKE 1 CAPSULE BY MOUTH EVERY 7 DAYS 12 capsule 0   famotidine (PEPCID) 40 MG tablet Take 1 tablet by mouth at bedtime.     omeprazole (PRILOSEC) 40 MG capsule Take 40 mg by mouth daily.     No facility-administered medications prior to visit.  ROS Review of Systems  Constitutional: Negative.  Negative for diaphoresis and fatigue.  HENT: Negative.    Eyes: Negative.   Respiratory:  Negative for cough, chest tightness, shortness of breath and wheezing.   Cardiovascular:  Negative for chest pain, palpitations and leg swelling.  Gastrointestinal:  Negative for abdominal pain, constipation, diarrhea and nausea.  Endocrine: Negative.   Genitourinary: Negative.  Negative for difficulty urinating.  Musculoskeletal: Negative.  Negative for arthralgias and myalgias.  Skin: Negative.   Neurological: Negative.  Negative for dizziness and weakness.  Hematological:  Negative for adenopathy. Does not bruise/bleed easily.  Psychiatric/Behavioral: Negative.      Objective:  BP 132/72 (BP Location: Left Arm, Patient Position: Sitting, Cuff Size: Large)   Pulse 86   Temp 98.1 F  (36.7 C) (Oral)   Resp 16   Ht '5\' 10"'$  (1.778 m)   Wt 234 lb (106.1 kg)   SpO2 94%   BMI 33.58 kg/m   BP Readings from Last 3 Encounters:  01/27/22 132/72  01/20/22 124/70  01/14/22 138/64    Wt Readings from Last 3 Encounters:  01/27/22 234 lb (106.1 kg)  01/20/22 234 lb (106.1 kg)  01/14/22 237 lb 6 oz (107.7 kg)    Physical Exam Vitals reviewed.  HENT:     Nose: Nose normal.     Mouth/Throat:     Mouth: Mucous membranes are moist.  Eyes:     General: No scleral icterus.    Conjunctiva/sclera: Conjunctivae normal.  Cardiovascular:     Rate and Rhythm: Normal rate.     Heart sounds: No murmur heard. Pulmonary:     Effort: Pulmonary effort is normal.     Breath sounds: No stridor. No wheezing, rhonchi or rales.  Abdominal:     General: Abdomen is flat.     Palpations: There is no mass.     Tenderness: There is no abdominal tenderness. There is no guarding.     Hernia: No hernia is present. There is no hernia in the left inguinal area or right inguinal area.  Genitourinary:    Pubic Area: No rash.      Penis: Normal and circumcised.      Testes: Normal.     Epididymis:     Right: Normal.     Left: Normal.     Prostate: Normal.     Rectum: Normal. Guaiac result negative.  Musculoskeletal:        General: Normal range of motion.     Cervical back: Neck supple.     Right lower leg: No edema.     Left lower leg: No edema.  Lymphadenopathy:     Cervical: No cervical adenopathy.     Lower Body: No right inguinal adenopathy. No left inguinal adenopathy.  Skin:    General: Skin is warm and dry.     Findings: No rash.  Neurological:     General: No focal deficit present.     Mental Status: He is alert.  Psychiatric:        Mood and Affect: Mood normal.        Behavior: Behavior normal.     Lab Results  Component Value Date   WBC 8.0 01/27/2022   HGB 14.0 01/27/2022   HCT 41.7 01/27/2022   PLT 218.0 01/27/2022   GLUCOSE 138 (H) 01/27/2022   CHOL 94  01/27/2022   TRIG 98.0 01/27/2022   HDL 40.70 01/27/2022   LDLDIRECT 87.0 12/02/2016   LDLCALC 34 01/27/2022  ALT 20 01/27/2022   AST 14 01/27/2022   NA 138 01/27/2022   K 4.2 01/27/2022   CL 101 01/27/2022   CREATININE 0.92 01/27/2022   BUN 15 01/27/2022   CO2 26 01/27/2022   TSH 2.48 12/27/2020   PSA 0.313 12/27/2020   HGBA1C 7.6 (H) 01/27/2022   MICROALBUR <0.7 01/27/2022    No results found.  Assessment & Plan:   Samari was seen today for annual exam, anemia, hypertension and hyperlipidemia.  Diagnoses and all orders for this visit:  Primary hypertension-his blood pressure is adequately well controlled. -     Basic metabolic panel; Future -     CBC with Differential/Platelet; Future -     Hepatic function panel; Future -     Urinalysis, Routine w reflex microscopic; Future -     Urinalysis, Routine w reflex microscopic -     Hepatic function panel -     CBC with Differential/Platelet -     Basic metabolic panel  Type 1 diabetes mellitus with hyperglycemia, with long-term current use of insulin (HCC)-his blood sugar is adequately well controlled. -     Microalbumin / creatinine urine ratio; Future -     Hemoglobin A1c; Future -     Hemoglobin A1c -     Microalbumin / creatinine urine ratio  Iron deficiency anemia secondary to inadequate dietary iron intake-his H&H are normal. -     CBC with Differential/Platelet; Future -     CBC with Differential/Platelet  Hyperlipidemia with target LDL less than 100- LDL goal achieved. Doing well on the statin  -     Lipid panel; Future -     Hepatic function panel; Future -     Hepatic function panel -     Lipid panel  Routine general medical examination at a health care facility- Exam completed, labs reviewed, vaccines reviewed and updated, cancer screenings are up-to-date, patient education was given.  Pure hyperglyceridemia -     Lipid panel; Future -     Lipid panel  Other orders -     Flu Vaccine QUAD 6+ mos PF IM  (Fluarix Quad PF) -     Pneumococcal polysaccharide vaccine 23-valent greater than or equal to 2yo subcutaneous/IM   I am having Keilyn D. Brule maintain his Calcium-Magnesium-Vitamin D (CALCIUM 500 PO), vitamin C, Alpha-Lipoic Acid, b complex vitamins, NON FORMULARY, Bayer Microlet Lancets, Magnesium Oxide, azelastine, omeprazole, famotidine, ferrous sulfate, Lyumjev, Contour Next Test, dapagliflozin propanediol, Dexcom G6 Transmitter, traZODone, atorvastatin, Qnasl, levocetirizine, losartan, hydrOXYzine, insulin aspart, metFORMIN, Vitamin D (Ergocalciferol), meloxicam, cyclobenzaprine, and amphetamine-dextroamphetamine.  No orders of the defined types were placed in this encounter.    Follow-up: Return in about 6 months (around 07/28/2022).  Scarlette Calico, MD

## 2022-01-27 NOTE — Patient Instructions (Signed)
Health Maintenance, Male Adopting a healthy lifestyle and getting preventive care are important in promoting health and wellness. Ask your health care provider about: The right schedule for you to have regular tests and exams. Things you can do on your own to prevent diseases and keep yourself healthy. What should I know about diet, weight, and exercise? Eat a healthy diet  Eat a diet that includes plenty of vegetables, fruits, low-fat dairy products, and lean protein. Do not eat a lot of foods that are high in solid fats, added sugars, or sodium. Maintain a healthy weight Body mass index (BMI) is a measurement that can be used to identify possible weight problems. It estimates body fat based on height and weight. Your health care provider can help determine your BMI and help you achieve or maintain a healthy weight. Get regular exercise Get regular exercise. This is one of the most important things you can do for your health. Most adults should: Exercise for at least 150 minutes each week. The exercise should increase your heart rate and make you sweat (moderate-intensity exercise). Do strengthening exercises at least twice a week. This is in addition to the moderate-intensity exercise. Spend less time sitting. Even light physical activity can be beneficial. Watch cholesterol and blood lipids Have your blood tested for lipids and cholesterol at 59 years of age, then have this test every 5 years. You may need to have your cholesterol levels checked more often if: Your lipid or cholesterol levels are high. You are older than 59 years of age. You are at high risk for heart disease. What should I know about cancer screening? Many types of cancers can be detected early and may often be prevented. Depending on your health history and family history, you may need to have cancer screening at various ages. This may include screening for: Colorectal cancer. Prostate cancer. Skin cancer. Lung  cancer. What should I know about heart disease, diabetes, and high blood pressure? Blood pressure and heart disease High blood pressure causes heart disease and increases the risk of stroke. This is more likely to develop in people who have high blood pressure readings or are overweight. Talk with your health care provider about your target blood pressure readings. Have your blood pressure checked: Every 3-5 years if you are 18-39 years of age. Every year if you are 40 years old or older. If you are between the ages of 65 and 75 and are a current or former smoker, ask your health care provider if you should have a one-time screening for abdominal aortic aneurysm (AAA). Diabetes Have regular diabetes screenings. This checks your fasting blood sugar level. Have the screening done: Once every three years after age 45 if you are at a normal weight and have a low risk for diabetes. More often and at a younger age if you are overweight or have a high risk for diabetes. What should I know about preventing infection? Hepatitis B If you have a higher risk for hepatitis B, you should be screened for this virus. Talk with your health care provider to find out if you are at risk for hepatitis B infection. Hepatitis C Blood testing is recommended for: Everyone born from 1945 through 1965. Anyone with known risk factors for hepatitis C. Sexually transmitted infections (STIs) You should be screened each year for STIs, including gonorrhea and chlamydia, if: You are sexually active and are younger than 59 years of age. You are older than 59 years of age and your   health care provider tells you that you are at risk for this type of infection. Your sexual activity has changed since you were last screened, and you are at increased risk for chlamydia or gonorrhea. Ask your health care provider if you are at risk. Ask your health care provider about whether you are at high risk for HIV. Your health care provider  may recommend a prescription medicine to help prevent HIV infection. If you choose to take medicine to prevent HIV, you should first get tested for HIV. You should then be tested every 3 months for as long as you are taking the medicine. Follow these instructions at home: Alcohol use Do not drink alcohol if your health care provider tells you not to drink. If you drink alcohol: Limit how much you have to 0-2 drinks a day. Know how much alcohol is in your drink. In the [place] one drink equals one 12 oz bottle of beer (355 mL), one 5 oz glass of wine (148 mL), or one 1 oz glass of hard liquor (44 mL). Lifestyle Do not use any products that contain nicotine or tobacco. These products include cigarettes, chewing tobacco, and vaping devices, such as e-cigarettes. If you need help quitting, ask your health care provider. Do not use street drugs. Do not share needles. Ask your health care provider for help if you need support or information about quitting drugs. General instructions Schedule regular health, dental, and eye exams. Stay current with your vaccines. Tell your health care provider if: You often feel depressed. You have ever been abused or do not feel safe at home. Summary Adopting a healthy lifestyle and getting preventive care are important in promoting health and wellness. Follow your health care provider's instructions about healthy diet, exercising, and getting tested or screened for diseases. Follow your health care provider's instructions on monitoring your cholesterol and blood pressure. This information is not intended to replace advice given to you by your health care provider. Make sure you discuss any questions you have with your health care provider. Document Revised: 09/22/2020 Document Reviewed: 09/22/2020 Elsevier Patient Education  2023 Elsevier Inc.  

## 2022-01-28 LAB — URINALYSIS, ROUTINE W REFLEX MICROSCOPIC
Bilirubin Urine: NEGATIVE
Hgb urine dipstick: NEGATIVE
Ketones, ur: NEGATIVE
Leukocytes,Ua: NEGATIVE
Nitrite: NEGATIVE
RBC / HPF: NONE SEEN (ref 0–?)
Specific Gravity, Urine: 1.01 (ref 1.000–1.030)
Total Protein, Urine: NEGATIVE
Urine Glucose: >=1000 — AB
Urobilinogen, UA: 0.2 (ref 0.0–1.0)
WBC, UA: NONE SEEN (ref 0–?)
pH: 5.5 (ref 5.0–8.0)

## 2022-01-29 ENCOUNTER — Other Ambulatory Visit: Payer: 59

## 2022-01-29 LAB — LIPID PANEL
Cholesterol: 94 mg/dL (ref 0–200)
HDL: 40.7 mg/dL (ref >39.00)
LDL Cholesterol: 34 mg/dL (ref 0–99)
NonHDL: 53.13
Total CHOL/HDL Ratio: 2
Triglycerides: 98 mg/dL (ref 0.0–149.0)
VLDL: 19.6 mg/dL (ref 0.0–40.0)

## 2022-01-29 LAB — CBC WITH DIFFERENTIAL/PLATELET
Basophils Absolute: 0.1 10*3/uL (ref 0.0–0.1)
Basophils Relative: 0.8 % (ref 0.0–3.0)
Eosinophils Absolute: 0.4 10*3/uL (ref 0.0–0.7)
Eosinophils Relative: 4.9 % (ref 0.0–5.0)
HCT: 41.7 % (ref 39.0–52.0)
Hemoglobin: 14 g/dL (ref 13.0–17.0)
Lymphocytes Relative: 27.9 % (ref 12.0–46.0)
Lymphs Abs: 2.2 10*3/uL (ref 0.7–4.0)
MCHC: 33.6 g/dL (ref 30.0–36.0)
MCV: 83.9 fl (ref 78.0–100.0)
Monocytes Absolute: 0.8 10*3/uL (ref 0.1–1.0)
Monocytes Relative: 10 % (ref 3.0–12.0)
Neutro Abs: 4.5 10*3/uL (ref 1.4–7.7)
Neutrophils Relative %: 56.4 % (ref 43.0–77.0)
Platelets: 218 10*3/uL (ref 150.0–400.0)
RBC: 4.97 Mil/uL (ref 4.22–5.81)
RDW: 14.7 % (ref 11.5–15.5)
WBC: 8 10*3/uL (ref 4.0–10.5)

## 2022-01-29 LAB — BASIC METABOLIC PANEL
BUN: 15 mg/dL (ref 6–23)
CO2: 26 mEq/L (ref 19–32)
Calcium: 9.7 mg/dL (ref 8.4–10.5)
Chloride: 101 mEq/L (ref 96–112)
Creatinine, Ser: 0.92 mg/dL (ref 0.40–1.50)
GFR: 91.5 mL/min (ref >60.00)
Glucose, Bld: 138 mg/dL — ABNORMAL HIGH (ref 70–99)
Potassium: 4.2 mEq/L (ref 3.5–5.1)
Sodium: 138 mEq/L (ref 135–145)

## 2022-01-29 LAB — HEPATIC FUNCTION PANEL
ALT: 20 U/L (ref 0–53)
AST: 14 U/L (ref 0–37)
Albumin: 4 g/dL (ref 3.5–5.2)
Alkaline Phosphatase: 89 U/L (ref 39–117)
Bilirubin, Direct: 0.2 mg/dL (ref 0.0–0.3)
Total Bilirubin: 1 mg/dL (ref 0.2–1.2)
Total Protein: 6.7 g/dL (ref 6.0–8.3)

## 2022-01-29 LAB — HEMOGLOBIN A1C: Hgb A1c MFr Bld: 7.6 % — ABNORMAL HIGH (ref 4.6–6.5)

## 2022-02-04 ENCOUNTER — Ambulatory Visit: Payer: 59 | Admitting: Family Medicine

## 2022-02-11 ENCOUNTER — Other Ambulatory Visit (HOSPITAL_COMMUNITY): Payer: Self-pay

## 2022-02-11 ENCOUNTER — Other Ambulatory Visit: Payer: Self-pay | Admitting: Internal Medicine

## 2022-02-11 DIAGNOSIS — F900 Attention-deficit hyperactivity disorder, predominantly inattentive type: Secondary | ICD-10-CM

## 2022-02-12 ENCOUNTER — Other Ambulatory Visit (HOSPITAL_COMMUNITY): Payer: Self-pay

## 2022-02-12 MED ORDER — AMPHETAMINE-DEXTROAMPHET ER 20 MG PO CP24
20.0000 mg | ORAL_CAPSULE | Freq: Every morning | ORAL | 0 refills | Status: DC
  Filled 2022-02-12: qty 30, fill #0
  Filled 2022-03-01: qty 30, 30d supply, fill #0

## 2022-02-17 ENCOUNTER — Other Ambulatory Visit (HOSPITAL_COMMUNITY): Payer: Self-pay

## 2022-02-19 ENCOUNTER — Other Ambulatory Visit (HOSPITAL_COMMUNITY): Payer: Self-pay

## 2022-03-01 ENCOUNTER — Other Ambulatory Visit (HOSPITAL_COMMUNITY): Payer: Self-pay

## 2022-03-02 NOTE — Progress Notes (Unsigned)
[facility] Phone: [phone] Subjective:   Zachary Graham, am serving as a scribe for Dr. Hulan Saas.  I'm seeing this patient by the request  of:  [name], MD  CC: back pain   EYC:[id number]  01/20/2022 Worsening low back pain again.  Did have x-rays of did not show any significant changes.  Does have some degenerative disc disease and could have some radicular symptoms as well.  We discussed posture and ergonomics otherwise.  If worsening symptoms advanced imaging will be warranted.  Toradol injection given today but held on the Depo-Medrol.  Follow-up again in 6 to 8 weeks  Updated 03/04/2022 Zachary Graham is a 61 y.o. male coming in with complaint of back pain. Patient states that his pain has subsided. He is timid though to flex spine as he does not want back to spasm. Patient has not had time to do HEP.       Past Medical History:  Diagnosis Date   Diabetes mellitus    GERD (gastroesophageal reflux disease)    Heart murmur    Hyperlipidemia    Hypertension    OSA (obstructive sleep apnea)    Pneumonia    Past Surgical History:  Procedure Laterality Date   CERVICAL DISC ARTHROPLASTY N/A 03/16/2017   Procedure: CERVICAL FIVE- CERVICAL SIX [place] ARTHROPLASTY;  Surgeon: Jovita Gamma, MD;  Location: [facility];  Service: Neurosurgery;  Laterality: N/A;  CERVICAL 5- CERVICAL 6 DISC ARTHROPLASTY   COLONOSCOPY     TONSILLECTOMY     Social History   Socioeconomic History   Marital status: Married    Spouse name: Not on file   Number of children: 3   Years of education: Not on file   Highest education level: Not on file  Occupational History   Occupation: RADIATION SAFETY OFFICER    Employer: [employer]  Tobacco Use   Smoking status: Never    Passive exposure: Never   Smokeless tobacco: Never  Vaping Use   Vaping Use: Never used  Substance and Sexual Activity   Alcohol use: Graham     Alcohol/week: 0.0 standard drinks of alcohol   Drug use: Graham   Sexual activity: Yes    Partners: Female  Other Topics Concern   Not on file  Social History Narrative   Regular exercise: runs 3 miles 3x week, takes one day off per week to rest   Caffeine use: stopped all diet soda on 04/23/15 per advice from Sports MD   Social Determinants of Health   Financial Resource Strain: Not on file  Food Insecurity: Not on file  Transportation Needs: Not on file  Physical Activity: Not on file  Stress: Not on file  Social Connections: Not on file   Allergies  Allergen Reactions   Lisinopril Swelling and Other (See Comments)    Extreme facial swelling causing hospitalization   Shellfish Allergy Anaphylaxis   Zetia [Ezetimibe] Other (See Comments)    ABDOMINAL CRAMPING   Dulaglutide Other (See Comments)   Gabapentin     Other reaction(s): Dizziness   Gabapentin (Once-Daily) Other (See Comments)   Iodine    Penicillins Other (See Comments)    UNSPECIFIED REACTION > Childhood allergy     Cymbalta [Duloxetine Hcl] Other (See Comments)    Severe feet cramps   Family History  Problem Relation Age of Onset   Colon cancer Maternal Grandfather    Colon cancer Paternal Grandfather  Heart disease Father        Valve replacement; pacemaker   Alcohol abuse Neg Hx    Asthma Neg Hx    Diabetes Neg Hx    Hyperlipidemia Neg Hx    Hypertension Neg Hx    Kidney disease Neg Hx    Stroke Neg Hx     Current Outpatient Medications (Endocrine & Metabolic):    dapagliflozin propanediol (FARXIGA) 5 MG TABS tablet, Take 1 tablet (5 mg total) by mouth daily before breakfast.   insulin aspart (NOVOLOG) 100 UNIT/ML injection, Inject max 50 units daily   Insulin Lispro-aabc (LYUMJEV) 100 UNIT/ML SOLN, USE UP TO 120 UNITS IN THE INSULIN PUMP DAILY   metFORMIN (GLUCOPHAGE) 500 MG tablet, Take 2 tablets (1,000 mg total) by mouth 2 (two) times daily before a meal.  Current Outpatient Medications  (Cardiovascular):    atorvastatin (LIPITOR) 80 MG tablet, Take 1 tablet by mouth daily.   losartan (COZAAR) 50 MG tablet, TAKE 1 TABLET BY MOUTH DAILY.  Current Outpatient Medications (Respiratory):    azelastine (ASTELIN) 0.1 % nasal spray, Place 2 sprays into both nostrils 2 (two) times daily. Use in each nostril as directed   Beclomethasone Dipropionate (QNASL) 80 MCG/ACT AERS, Place 2 puffs into the nose daily as needed (allergies).   levocetirizine (XYZAL) 5 MG tablet, TAKE 1 TABLET BY MOUTH IN THE EVENING  Current Outpatient Medications (Analgesics):    meloxicam (MOBIC) 15 MG tablet, Take 1 tablet (15 mg total) by mouth daily.  Current Outpatient Medications (Hematological):    ferrous sulfate 325 (65 FE) MG tablet, Take 1 tablet (325 mg total) by mouth daily with breakfast.  Current Outpatient Medications (Other):    Alpha-Lipoic Acid 600 MG CAPS, Take 1 capsule by mouth daily.    amphetamine-dextroamphetamine (ADDERALL XR) 20 MG 24 hr capsule, Take 1 capsule (20 mg total) by mouth in the morning.   Ascorbic Acid (VITAMIN C) 1000 MG tablet, Take 1,000 mg by mouth daily.   b complex vitamins tablet, Take 1 tablet by mouth daily.   BAYER MICROLET LANCETS lancets, USE TO TEST BLOOD SUGAR 6 TIMES A DAY   Calcium-Magnesium-Vitamin D (CALCIUM 500 PO), Take 1 tablet by mouth daily.    Continuous Blood Gluc Transmit (DEXCOM G6 TRANSMITTER) MISC, Use to monitor blood glucose, replace every 90 days.   cyclobenzaprine (FLEXERIL) 10 MG tablet, Take 1 tablet (10 mg total) by mouth 3 (three) times daily as needed for muscle spasms.   glucose blood (CONTOUR NEXT TEST) test strip, USE AS INSTRUCTED TO CHECK SUGAR 6 TIMES DAILY   hydrOXYzine (ATARAX) 10 MG tablet, Take 1 tablet by mouth at bedtime as needed.   Magnesium Oxide 400 MG CAPS, Take 1 capsule (400 mg total) by mouth daily.   NON FORMULARY, CPAP   traZODone (DESYREL) 50 MG tablet, TAKE 1 TABLET BY MOUTH AT BEDTIME   Vitamin D,  Ergocalciferol, (DRISDOL) 1.25 MG (50000 UNIT) CAPS capsule, TAKE 1 CAPSULE BY MOUTH EVERY 7 DAYS   famotidine (PEPCID) 40 MG tablet, Take 1 tablet by mouth at bedtime.   omeprazole (PRILOSEC) 40 MG capsule, Take 40 mg by mouth daily.   Reviewed prior external information including notes and imaging from  primary care provider As well as notes that were available from care everywhere and other healthcare systems.  Past medical history, social, surgical and family history all reviewed in electronic medical record.  Graham pertanent information unless stated regarding to the chief complaint.   Review of  Systems:  Graham headache, visual changes, nausea, vomiting, diarrhea, constipation, dizziness, abdominal pain, skin rash, fevers, chills, night sweats, weight loss, swollen lymph nodes, body aches, joint swelling, chest pain, shortness of breath, mood changes. POSITIVE muscle aches  Objective  Blood pressure (!) 142/74, pulse (!) 50, height '5\' 10"'$  (1.778 m), weight 238 lb (108 kg), SpO2 97 %.   General: Graham apparent distress alert and oriented x3 mood and affect normal, dressed appropriately.  HEENT: Pupils equal, extraocular movements intact  Respiratory: Patient's speak in full sentences and does not appear short of breath  Cardiovascular: Graham lower extremity edema, non tender, Graham erythema  LOW back still tightness noted, more also in the FABER tight in TL level    Osteopathic findings T3 extended rotated and side bent right inhaled third rib T8 extended rotated and side bent left L1 flexed rotated and side bent right Sacrum right on right    Impression and Recommendations:    The above documentation has been reviewed and is accurate and complete [name], DO

## 2022-03-04 ENCOUNTER — Ambulatory Visit (INDEPENDENT_AMBULATORY_CARE_PROVIDER_SITE_OTHER): Payer: 59 | Admitting: Family Medicine

## 2022-03-04 VITALS — BP 142/74 | HR 50 | Ht 70.0 in | Wt 238.0 lb

## 2022-03-04 DIAGNOSIS — M9902 Segmental and somatic dysfunction of thoracic region: Secondary | ICD-10-CM | POA: Diagnosis not present

## 2022-03-04 DIAGNOSIS — G8929 Other chronic pain: Secondary | ICD-10-CM | POA: Diagnosis not present

## 2022-03-04 DIAGNOSIS — M9903 Segmental and somatic dysfunction of lumbar region: Secondary | ICD-10-CM | POA: Diagnosis not present

## 2022-03-04 DIAGNOSIS — M545 Low back pain, unspecified: Secondary | ICD-10-CM | POA: Diagnosis not present

## 2022-03-04 DIAGNOSIS — M9904 Segmental and somatic dysfunction of sacral region: Secondary | ICD-10-CM

## 2022-03-04 NOTE — Assessment & Plan Note (Signed)
Left pain is better at this time, started some OMT today, discussed which activities to do and which ones to avoid.

## 2022-03-04 NOTE — Patient Instructions (Addendum)
Good to see you Take time for yourself PT at [facility]  See me 6-8 weeks

## 2022-03-08 DIAGNOSIS — K219 Gastro-esophageal reflux disease without esophagitis: Secondary | ICD-10-CM | POA: Diagnosis not present

## 2022-03-08 DIAGNOSIS — Z91013 Allergy to seafood: Secondary | ICD-10-CM | POA: Diagnosis not present

## 2022-03-08 DIAGNOSIS — J300 Vasomotor rhinitis: Secondary | ICD-10-CM | POA: Diagnosis not present

## 2022-03-17 ENCOUNTER — Ambulatory Visit (INDEPENDENT_AMBULATORY_CARE_PROVIDER_SITE_OTHER): Payer: 59 | Admitting: Physical Therapy

## 2022-03-17 DIAGNOSIS — M5459 Other low back pain: Secondary | ICD-10-CM

## 2022-03-17 NOTE — Therapy (Signed)
OUTPATIENT PHYSICAL THERAPY LOWER EXTREMITY EVALUATION   Patient Name: Zachary Graham MRN: [id number] DOB:1960-08-17, 61 y.o., male Today's Date: 03/17/2022    Past Medical History:  Diagnosis Date   Diabetes mellitus    GERD (gastroesophageal reflux disease)    Heart murmur    Hyperlipidemia    Hypertension    OSA (obstructive sleep apnea)    Pneumonia    Past Surgical History:  Procedure Laterality Date   CERVICAL DISC ARTHROPLASTY N/A 03/16/2017   Procedure: CERVICAL FIVE- CERVICAL SIX [place] ARTHROPLASTY;  Surgeon: Jovita Gamma, MD;  Location: [place];  Service: Neurosurgery;  Laterality: N/A;  CERVICAL 5- CERVICAL 6 DISC ARTHROPLASTY   COLONOSCOPY     TONSILLECTOMY     Patient Active Problem List   Diagnosis Date Noted   Preglaucoma, unspecified, bilateral 01/14/2022   Seborrheic keratosis 01/14/2022   Type 1 diabetes mellitus with hyperglycemia ([place]) 01/14/2022   Vasomotor rhinitis 01/14/2022   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/2022   Adult attention deficit hyperactivity disorder 01/14/2022   Allergy to shellfish 01/14/2022   Dysphagia, oropharyngeal phase 01/14/2022   Gastro-esophageal reflux disease without esophagitis 01/14/2022   Low back pain 10/29/2021   Iron deficiency anemia secondary to inadequate dietary iron intake 02/02/2021   Frequent PVCs 10/28/2020   Hypertension    Nonallopathic lesion of thoracic region 04/28/2020   Chronic prostatitis without hematuria 05/08/2019   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 ([place]) 03/20/2018   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 diabetes mellitus ([place]) 08/31/2017   Mild cataract of right eye 08/31/2017   HNP (herniated nucleus pulposus), cervical 03/16/2017   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 ([place]) 01/06/2017   Herniation of cervical intervertebral disc with  radiculopathy 01/04/2017   Current mild episode of major depressive disorder without prior episode ([place]) 12/02/2016   Diabetic neuropathy ([place]) 03/01/2016   Obesity, Class II, BMI 35.0-39.9, with comorbidity (see actual BMI) 04/24/2015   Type 1 diabetes mellitus with hyperglycemia, with long-term current use of insulin ([place]) 04/04/2015   Patellofemoral stress syndrome of left knee 03/19/2015   Allergic rhinitis due to pollen 11/14/2014   Equinus deformity of foot, acquired 05/29/2014   ADHD (attention deficit hyperactivity disorder), inattentive type 10/24/2013   Vitamin D deficiency 02/28/2013   Pulmonic stenosis 05/25/2012   Pure hyperglyceridemia 05/25/2012   Hyperlipidemia with target LDL less than 100 09/20/2011   OSA (obstructive sleep apnea) 09/20/2011   BPH (benign prostatic hyperplasia) 09/20/2011   Routine general medical examination at a health care facility 09/20/2011    PCP: Scarlette Calico, MD  REFERRING PROVIDER: [name]  REFERRING DIAG: Low back pain   THERAPY DIAG:  No diagnosis found.  Rationale for Evaluation and Treatment: Rehabilitation  ONSET DATE:   SUBJECTIVE:   SUBJECTIVE STATEMENT: End of August, bent over, had significant back pain, L side. No previous back pain.  Has been  Now: mild Low back soreness bilateral Joined gym: [place] bend/fearful  Neck: doing well . L shoulder: limited for elevation?    PERTINENT HISTORY: HTN, DM   PAIN:  Are you having pain? Yes: NPRS scale: 2/10 Pain location: *** Pain description: *** Aggravating factors: increased activity/end of the day  Relieving factors: ***    PRECAUTIONS: None  WEIGHT BEARING RESTRICTIONS: No  FALLS:  Has patient fallen in last 6 months? No   PLOF: Independent  PATIENT GOALS: ***   OBJECTIVE:  DIAGNOSTIC FINDINGS:    COGNITION: Overall cognitive status: Within functional limits for tasks assessed     POSTURE:     PALPATION: Tightness in bil HS  R>L.   Soreness in central T and L spine with PA s.   LOWER EXTREMITY ROM:  Active ROM Right eval Left eval  Hip flexion    Hip extension    Hip abduction    Hip adduction    Hip internal rotation    Hip external rotation    Knee flexion    Knee extension    Ankle dorsiflexion    Ankle plantarflexion    Ankle inversion    Ankle eversion     (Blank rows = not tested)  LOWER EXTREMITY MMT:  MMT Right eval Left eval  Hip flexion    Hip extension    Hip abduction    Hip adduction    Hip internal rotation    Hip external rotation    Knee flexion    Knee extension    Ankle dorsiflexion    Ankle plantarflexion    Ankle inversion    Ankle eversion     (Blank rows = not tested)  LOWER EXTREMITY SPECIAL TESTS:  {LEspecialtests:26242}  FUNCTIONAL TESTS:  {Functional tests:24029}  GAIT: Distance walked: *** Assistive device utilized: {Assistive devices:23999} Level of assistance: {Levels of assistance:24026} Comments: ***   TODAY'S TREATMENT:                                                                                                                              DATE: ***    PATIENT EDUCATION:  Education details: PT POC, Exam findings, HEP Person educated: Patient Education method: Consulting civil engineer, Demonstration, Tactile cues, Verbal cues, and Handouts Education comprehension: verbalized understanding, returned demonstration, verbal cues required, tactile cues required, and needs further education   HOME EXERCISE PROGRAM:      ASSESSMENT:  CLINICAL IMPRESSION: Patient ***  OBJECTIVE IMPAIRMENTS: {opptimpairments:25111}.   ACTIVITY LIMITATIONS: lifting, bending, standing, squatting, sleeping, stairs, and locomotion level  PARTICIPATION LIMITATIONS: meal prep, cleaning, shopping, community activity, and yard work  PERSONAL FACTORS: none are also affecting patient's functional outcome.   REHAB POTENTIAL: Good  CLINICAL DECISION MAKING:  Stable/uncomplicated  EVALUATION COMPLEXITY: Low   GOALS: Goals reviewed with patient? Yes  SHORT TERM GOALS: Target date: 03/31/2022   Pt to be independent with initial HEP  Goal status: INITIAL  2.  ***  Goal status: INITIAL  3.  ***  Goal status: INITIAL      LONG TERM GOALS: Target date: 05/12/2022   Pt to be independent with final HEP  Goal status: INITIAL  2.  ***  Goal status: INITIAL  3.  ***  Goal status: INITIAL  4.  ***  Goal status: INITIAL  5.  *** Baseline:  Goal status: {GOALSTATUS:25110}  6.  *** Baseline:  Goal status: {GOALSTATUS:25110}   PLAN:  PT FREQUENCY: 1-2x/week  PT DURATION: 8 weeks  PLANNED INTERVENTIONS: Therapeutic exercises, Therapeutic activity, Neuromuscular re-education, Gait training, Patient/Family education, Self Care, Joint mobilization, Joint manipulation, Stair training, Orthotic/Fit training, DME instructions, Dry Needling, Electrical stimulation, Spinal manipulation, Spinal mobilization, Cryotherapy, Moist heat, Taping, Vasopneumatic device, Traction, Ultrasound, Ionotophoresis '4mg'$ /ml Dexamethasone, and Manual therapy  PLAN FOR NEXT SESSION:   Lyndee Hensen, PT, DPT 12:50 PM  03/17/22

## 2022-03-18 ENCOUNTER — Encounter: Payer: Self-pay | Admitting: Physical Therapy

## 2022-03-19 DIAGNOSIS — R0789 Other chest pain: Secondary | ICD-10-CM | POA: Diagnosis not present

## 2022-03-19 DIAGNOSIS — N529 Male erectile dysfunction, unspecified: Secondary | ICD-10-CM | POA: Diagnosis not present

## 2022-03-19 DIAGNOSIS — K219 Gastro-esophageal reflux disease without esophagitis: Secondary | ICD-10-CM | POA: Diagnosis not present

## 2022-03-19 DIAGNOSIS — Z23 Encounter for immunization: Secondary | ICD-10-CM | POA: Diagnosis not present

## 2022-03-19 DIAGNOSIS — J310 Chronic rhinitis: Secondary | ICD-10-CM | POA: Diagnosis not present

## 2022-03-26 ENCOUNTER — Other Ambulatory Visit (HOSPITAL_COMMUNITY): Payer: Self-pay

## 2022-03-26 ENCOUNTER — Encounter: Payer: Self-pay | Admitting: Internal Medicine

## 2022-03-26 ENCOUNTER — Ambulatory Visit (INDEPENDENT_AMBULATORY_CARE_PROVIDER_SITE_OTHER): Payer: 59 | Admitting: Physical Therapy

## 2022-03-26 ENCOUNTER — Ambulatory Visit (INDEPENDENT_AMBULATORY_CARE_PROVIDER_SITE_OTHER): Payer: 59 | Admitting: Internal Medicine

## 2022-03-26 ENCOUNTER — Encounter: Payer: Self-pay | Admitting: Physical Therapy

## 2022-03-26 VITALS — BP 128/86 | HR 70 | Ht 70.0 in | Wt 235.0 lb

## 2022-03-26 DIAGNOSIS — M5459 Other low back pain: Secondary | ICD-10-CM | POA: Diagnosis not present

## 2022-03-26 DIAGNOSIS — E785 Hyperlipidemia, unspecified: Secondary | ICD-10-CM

## 2022-03-26 DIAGNOSIS — E1065 Type 1 diabetes mellitus with hyperglycemia: Secondary | ICD-10-CM

## 2022-03-26 LAB — POCT GLYCOSYLATED HEMOGLOBIN (HGB A1C): Hemoglobin A1C: 7.1 % — AB (ref 4.0–5.6)

## 2022-03-26 MED ORDER — METFORMIN HCL 500 MG PO TABS
1000.0000 mg | ORAL_TABLET | Freq: Two times a day (BID) | ORAL | 3 refills | Status: DC
  Filled 2022-03-26: qty 360, 90d supply, fill #0
  Filled 2022-07-03: qty 360, 90d supply, fill #1
  Filled 2022-12-06: qty 360, 90d supply, fill #2

## 2022-03-26 MED ORDER — INSULIN ASPART 100 UNIT/ML IJ SOLN: INTRAMUSCULAR | 3 refills | Status: DC

## 2022-03-26 MED ORDER — DEXCOM G6 SENSOR MISC
1.0000 | 3 refills | Status: DC
  Filled 2022-03-26: qty 3, 30d supply, fill #0
  Filled 2022-05-05: qty 3, 30d supply, fill #1
  Filled 2022-07-23: qty 3, 30d supply, fill #2
  Filled 2022-09-07: qty 3, 30d supply, fill #3

## 2022-03-26 MED ORDER — CONTOUR NEXT TEST VI STRP
ORAL_STRIP | 3 refills | Status: DC
  Filled 2022-03-26: qty 400, 66d supply, fill #0
  Filled 2022-05-05 – 2022-05-24 (×2): qty 400, 66d supply, fill #1
  Filled 2022-09-19: qty 400, 66d supply, fill #2
  Filled 2022-12-15 (×2): qty 400, 66d supply, fill #3

## 2022-03-26 MED ORDER — DAPAGLIFLOZIN PROPANEDIOL 5 MG PO TABS
5.0000 mg | ORAL_TABLET | Freq: Every day | ORAL | 3 refills | Status: DC
  Filled 2022-03-26 – 2022-04-19 (×2): qty 90, 90d supply, fill #0
  Filled 2022-07-20: qty 90, 90d supply, fill #1
  Filled 2022-09-19 – 2022-10-19 (×2): qty 90, 90d supply, fill #2
  Filled 2023-01-12: qty 90, 90d supply, fill #3

## 2022-03-26 NOTE — Patient Instructions (Addendum)
Please use the following pump settings: - basal: 12 AM: 1.15 4 AM: 1.30 6:30 AM: 1.60 8 AM: 1.90 10 AM: 1.65 12 PM: 1.45 4 PM: 1.35  9 PM: 1.55 10:30 PM: 1.45 - bolus:  - Insulin to carb ratio: 1:3, except 2 pm: 1:2.5 - Insulin sensitivity factor:  25  - target: 120-120 - Active insulin time: 3 hours  Please continue: - Metformin 1000 mg 2x with meals  - Farxiga 5 mg before breakfast  Please come back for a follow-up appointment in 4 months.

## 2022-03-26 NOTE — Therapy (Signed)
OUTPATIENT PHYSICAL THERAPY LOWER EXTREMITY TREATMENT   Patient Name: Zachary Graham MRN: [id number] DOB:15-Aug-1960, 61 y.o., male Today's Date: 03/26/2022   PT End of Session - 03/26/22 1321     Visit Number 2    Number of Visits 16    Date for PT Re-Evaluation 05/13/22    Authorization Type [employer]- [employer]    PT Start Time 1231    PT Stop Time 1312    PT Time Calculation (min) 41 min    Activity Tolerance Patient tolerated treatment well    Behavior During Therapy WFL for tasks assessed/performed              Past Medical History:  Diagnosis Date   Diabetes mellitus    GERD (gastroesophageal reflux disease)    Heart murmur    Hyperlipidemia    Hypertension    OSA (obstructive sleep apnea)    Pneumonia    Past Surgical History:  Procedure Laterality Date   CERVICAL DISC ARTHROPLASTY N/A 03/16/2017   Procedure: CERVICAL FIVE- CERVICAL SIX [place] ARTHROPLASTY;  Surgeon: Jovita Gamma, MD;  Location: [facility];  Service: Neurosurgery;  Laterality: N/A;  CERVICAL 5- CERVICAL 6 DISC ARTHROPLASTY   COLONOSCOPY     TONSILLECTOMY     Patient Active Problem List   Diagnosis Date Noted   Preglaucoma, unspecified, bilateral 01/14/2022   Seborrheic keratosis 01/14/2022   Type 1 diabetes mellitus with hyperglycemia ([place]) 01/14/2022   Vasomotor rhinitis 01/14/2022   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/2022   Adult attention deficit hyperactivity disorder 01/14/2022   Allergy to shellfish 01/14/2022   Dysphagia, oropharyngeal phase 01/14/2022   Gastro-esophageal reflux disease without esophagitis 01/14/2022   Low back pain 10/29/2021   Iron deficiency anemia secondary to inadequate dietary iron intake 02/02/2021   Frequent PVCs 10/28/2020   Hypertension    Nonallopathic lesion of thoracic region 04/28/2020   Chronic prostatitis without hematuria 05/08/2019   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) 12/02/2016   Diabetic neuropathy ([place]) 03/01/2016   Obesity, Class II, BMI 35.0-39.9, with comorbidity (see actual BMI) 04/24/2015   Type 1 diabetes mellitus with hyperglycemia, with long-term current use of insulin ([place]) 04/04/2015   Patellofemoral stress syndrome of left knee 03/19/2015   Allergic rhinitis due to pollen 11/14/2014   Equinus deformity of foot, acquired 05/29/2014   ADHD (attention deficit hyperactivity disorder), inattentive type 10/24/2013   Vitamin D deficiency 02/28/2013   Pulmonic stenosis 05/25/2012   Pure hyperglyceridemia 05/25/2012   Hyperlipidemia with target LDL less than 100 09/20/2011   OSA (obstructive sleep apnea) 09/20/2011   BPH (benign prostatic hyperplasia) 09/20/2011   Routine general medical examination at a health care facility 09/20/2011    PCP: Scarlette Calico, MD  REFERRING PROVIDER: [name]  REFERRING DIAG: Low back pain   THERAPY DIAG:  Other low back pain  Rationale for Evaluation and Treatment: Rehabilitation  ONSET DATE:   SUBJECTIVE:   SUBJECTIVE STATEMENT:  Pt states mild soreness in low back. Doing ok with exercises.    Eval: End  of August, pt bent over to reach a piece of paper on floor, had significant back pain, L side. Has had only very mild previous back pain.  Now: significantly better, just having mild Low back soreness bilaterally He states he is still not able, and is fearful for forward bending.  Pt works very long hours.  Joined gym:  [employer] , has not been much due to hurting his back.  Neck: doing well . L shoulder: states limited for elevation   PERTINENT HISTORY: HTN, DM   PAIN:  Are you having pain? Yes: NPRS scale: 2/10 Pain location: bil low back Pain description: sore, tight Aggravating factors: increased activity/end of the day  Relieving factors: none stated     PRECAUTIONS: None  WEIGHT BEARING RESTRICTIONS: No  FALLS:  Has patient fallen in last 6 months? No   PLOF: Independent  PATIENT GOALS: Improve mobility, pain   OBJECTIVE:   DIAGNOSTIC FINDINGS:    COGNITION: Overall cognitive status: Within functional limits for tasks assessed     POSTURE: stiff back and upper body posture, mild fwd head and rounded shoulders    PALPATION: Tightness in bil HS R(significant) >L.   Hypomobile R hip flex and IR  Soreness in central T and L spine with PA s.   LOWER EXTREMITY ROM:  Lumbar: flexion: significant limitation: 70 deg,        Extension: mod/significant limitation      SB: mod limitation bil  Active ROM Right eval Left eval  Hip flexion Mild/mod limitation WFL  Hip extension    Hip abduction    Hip adduction    Hip internal rotation Significant limitation Mild limitation  Hip external rotation [facility]  Knee flexion [facility] WFL  Knee extension [facility]  Ankle dorsiflexion    Ankle plantarflexion    Ankle inversion    Ankle eversion     (Blank rows = not tested)  LOWER EXTREMITY MMT:  MMT Right eval Left eval  Hip flexion 4 4  Hip extension    Hip abduction 4 4  Hip adduction    Hip internal rotation    Hip external rotation    Knee flexion 5 5  Knee extension 5 5  Ankle dorsiflexion    Ankle plantarflexion    Ankle inversion    Ankle eversion     (Blank rows = not tested)  LOWER EXTREMITY SPECIAL TESTS:  Significant tightness R>L Hamstrings,  Neg SLR,   Hypomobile R hip flex and IR,    TODAY'S TREATMENT:                                                                                                                               DATE:  03/26/22:  Therapeutic Exercise: Aerobic: Supine: Bridging x 15;  Seated: Sit to stand x 10 with education on mechanics;  Standing:  Rows GTB x 20;  mini squat x 15 with education on mechanics;  Stretches: Seated HSS 30 sec x 3 bil; Seated piriformis 30 sec x 3 bil; seated lumbar fwd flexion 30 sec x 3;  LTR x 20;  SKTC 30 sec x 3 bil with towel;  Neuromuscular Re-education: Manual Therapy: long leg distraction x 1 min bil; hip IR stretching bil;    PATIENT EDUCATION:  Education details: Updated and reviewed HEP Person educated: Patient Education method: Explanation, Demonstration, Tactile cues, Verbal cues, and Handouts Education comprehension: verbalized understanding, returned demonstration, verbal cues required, tactile cues required, and needs further education   HOME EXERCISE PROGRAM:  Access Code: [email or web address]   ASSESSMENT:   CLINICAL IMPRESSION: 03/26/2022 Pt with good ability for ther ex today. Reviewed HEP in detail with attention to correct mechanics. Education and practice for hip hinge and mini squat position, will benefit from continued work on this, as well as for lower bend, squat, lift.   Eval: Patient presents with primary complaint of increased pain in low back. He did have significant pain in previous months that has improved. He has significant movement limitations, with limited and fearful flexion and bending. He has decreased core strength, and lack of effective HEP for his diagnosis. Pt to benefit from skilled PT to improve deficits and pain.   OBJECTIVE IMPAIRMENTS: decreased activity tolerance, decreased mobility, decreased ROM, decreased strength, hypomobility, increased muscle spasms, impaired flexibility, improper body mechanics, and pain.   ACTIVITY LIMITATIONS: lifting, bending, standing, squatting, sleeping, stairs, and locomotion level  PARTICIPATION LIMITATIONS:  laundry, shopping, community activity, and yard work  PERSONAL FACTORS: none are also affecting patient's functional outcome.   REHAB POTENTIAL: Good  CLINICAL DECISION MAKING: Stable/uncomplicated  EVALUATION COMPLEXITY: Low   GOALS: Goals reviewed with patient? Yes  SHORT TERM GOALS: Target date: 03/31/2022   Pt to be independent with initial HEP  Goal status: INITIAL    LONG TERM GOALS: Target date: 05/12/2022   Pt to be independent with final HEP  Goal status: INITIAL  2.  Pt to demo improved ROM for lumbar flexion to be [facility] for pt age.   Goal status: INITIAL  3.  Pt to demo ability for bend/lift/squat with optimal mechanics, to improve ability for IADLS and work duties   Goal status: INITIAL  4.  Pt to report decreased pain in back to 0-2/10 with activity and exercise.  Goal status: INITIAL    PLAN:  PT FREQUENCY: 1-2x/week  PT DURATION: 8 weeks  PLANNED INTERVENTIONS: Therapeutic exercises, Therapeutic activity, Neuromuscular re-education, Gait training, Patient/Family education, Self Care, Joint mobilization, Joint manipulation, Stair training, Orthotic/Fit training, DME instructions, Dry Needling, Electrical stimulation, Spinal manipulation, Spinal mobilization, Cryotherapy, Moist heat, Taping, Vasopneumatic device, Traction, Ultrasound, Ionotophoresis '4mg'$ /ml Dexamethasone, and Manual therapy  PLAN FOR NEXT SESSION:   Lyndee Hensen, PT, DPT 1:22 PM  03/26/22

## 2022-03-26 NOTE — Progress Notes (Addendum)
Patient ID: Zachary Graham, male   DOB: January 26, 1961, 61 y.o.   MRN: [id number]   HPI: Zachary Graham is a 61 y.o.-year-old male, presenting for f/u for DM1, dx 1993, insulin-dependent, uncontrolled, with complications (mild sensory peripheral neuropathy; hypoglycemia episodes, DR). Last visit 4 months ago.  Interim history: No increased urination, blurry vision, nausea.  He continues to have long hours at work and significantly increased stress and burnout.  He still comes home late, and most days he eats dinner at work, also. He went on a [place] cruise since last OV: [place], [place], [place].  Insulin pump: - Started in 2006 - Medtronic 670 G pump - now t:slim x2 - started beginning of 10/2021  CGM: -Libre CGM as a backup -now Dexcom G6 CGM  Insulin: - Prev. Humalog >> Lyumjev  (50 vials for 3 months).  He initially lost 15 pounds after switching from Humalog to Lyumjev. - before last visit, he wanted to get back to NovoLog due to Lyumjev not being approved in the pump  - then Lyumjev again, but this is causing burning at the infusion site so he switched back to NovoLog approximately 3 weeks ago  Reviewed HbA1c levels: Lab Results  Component Value Date   HGBA1C 7.6 (H) 01/27/2022   HGBA1C 7.4 (A) 11/30/2021   HGBA1C 7.8 (A) 07/24/2021   HGBA1C 8.0 12/27/2020   HGBA1C 7.6 (A) 12/05/2020   HGBA1C 8.3 (H) 10/28/2020   HGBA1C 7.2 (A) 07/24/2020   HGBA1C 7.2 (A) 03/26/2020   HGBA1C 7.3 (A) 11/20/2019   HGBA1C 7.4 (A) 08/02/2019   HGBA1C 7.4 (A) 04/06/2019   HGBA1C 7.6 (A) 12/08/2018   HGBA1C 8.2 (H) 10/19/2018   HGBA1C 7.7 (A) 05/02/2018   HGBA1C 7.3 (A) 01/05/2018   HGBA1C 8.1 (A) 10/03/2017   HGBA1C 7.6 03/10/2017   HGBA1C 9.7 (H) 12/02/2016   HGBA1C 7.3 04/22/2016   HGBA1C 7.8 02/17/2016  10/2019: HbA1c calculated from fructosamine: 6.3%  Pump settings: - basal: 12 AM: 1.15 3 AM: 1.10 >> 1.15 4 AM: 1.30 6:30 AM: 1.60 8 AM: 1.90 10 AM: 1.65 12 PM: 1.45 4 PM: 1.35   8 pm: 1.60 >> 1.55 9 PM: 1.55 10:30 PM: 1.45 - bolus:  - Insulin to carb ratio: 1:3 - Insulin sensitivity factor:  25  - target: 120-120 - Active insulin time: 3 hours  TDD basal: 39% >> 36% >> 32% >> 34% >> 33% TDD bolus: 61% >> 64% >> 68% >> 66% >> 67% TDD 122 units >> 210-240  >> 124-140 units a day - Bolus wizard: On - Changes the pump site:  every 1-2 >> every 2-3 days   He is also on:  - Metformin 500 >> 1000 mg twice a day - Actos 45 mg daily-started 12/2019 >> decreased to 22.5 mg in 01/2020 - Farxiga 5 mg before breakfast-started 82/9562 We tried Trulicy in 13/0865 but he stopped in 02/2019 due to nausea and abdominal pain.  He checks his sugars more than 4 times a day with his Dexcom CGM:    Prev.:   Previously:   Lowest: 50 >> 70s >> 70s >> 40 >> 40. Highest: 300 >> 300 >> upper 300s >> 380 >> 400. He has a history of a severe hypoglycemic episode in 2016.  He has a glucagon kit at home (refilled by the [place]). No previous DKA admissions.  -No CKD, last BUN/creatinine:  Lab Results  Component Value Date   BUN 15 01/27/2022   CREATININE 0.92 01/27/2022  04/11/2018: 10/0.778, ACR 11.1 On Cozaar.  -+ HL; last set of lipids: Lab Results  Component Value Date   CHOL 94 01/27/2022   HDL 40.70 01/27/2022   LDLCALC 34 01/27/2022   LDLDIRECT 87.0 12/02/2016   TRIG 98.0 01/27/2022   CHOLHDL 2 01/27/2022  06/23/2020: 79/74/43/21 On high-dose Lipitor.  - last eye exam was in 09/2021: No DR, previously + Mild NPDR. [facility].   -+ Numbness and tingling in his feet.  This has improved on the B complex.  Last foot exam-11/30/2021. B12 was normal, 855, on 01/28/2021.  He also has a history of GERD, HTN, OSA.  Latest TSH was normal: Lab Results  Component Value Date   TSH 2.48 12/27/2020  04/11/2018: TSH 3.02  Vitamin D levels have been normal: Lab Results  Component Value Date   VD25OH 74.87 10/28/2020   VD25OH 59.85 10/23/2019   VD25OH 68.22  10/19/2018   VD25OH 83.45 10/03/2017   VD25OH 41.95 06/06/2015   VD25OH 35.03 07/17/2014   VD25OH 44 03/01/2013  04/11/2018: vitamin D 46.7.   Previously on ergocalciferol.  He has adhesive capsulitis >> improved.  He is getting occasional steroid injections but we discussed to reduce these as much as possible.  On meloxicam.  ROS: + see HPI  I reviewed pt's medications, allergies, PMH, social hx, family hx, and changes were documented in the history of present illness. Otherwise, unchanged from my initial visit note.  Past Medical History:  Diagnosis Date   Diabetes mellitus    GERD (gastroesophageal reflux disease)    Heart murmur    Hyperlipidemia    Hypertension    OSA (obstructive sleep apnea)    Pneumonia    Past Surgical History:  Procedure Laterality Date   CERVICAL DISC ARTHROPLASTY N/A 03/16/2017   Procedure: CERVICAL FIVE- CERVICAL SIX [place] ARTHROPLASTY;  Surgeon: Jovita Gamma, MD;  Location: [facility];  Service: Neurosurgery;  Laterality: N/A;  CERVICAL 5- CERVICAL 6 DISC ARTHROPLASTY   COLONOSCOPY     TONSILLECTOMY     Social History   Socioeconomic History   Marital status: Married    Spouse name: Not on file   Number of children: 3   Years of education: Not on file   Highest education level: Not on file  Occupational History   Occupation: RADIATION SAFETY OFFICER    Employer: [employer]  Tobacco Use   Smoking status: Never    Passive exposure: Never   Smokeless tobacco: Never  Vaping Use   Vaping Use: Never used  Substance and Sexual Activity   Alcohol use: No    Alcohol/week: 0.0 standard drinks of alcohol   Drug use: No   Sexual activity: Yes    Partners: Female  Other Topics Concern   Not on file  Social History Narrative   Regular exercise: runs 3 miles 3x week, takes one day off per week to rest   Caffeine use: stopped all diet soda on 04/23/15 per advice from Sports MD   Social Determinants of Health   Financial Resource Strain: Not  on file  Food Insecurity: Not on file  Transportation Needs: Not on file  Physical Activity: Not on file  Stress: Not on file  Social Connections: Not on file  Intimate Partner Violence: Not on file   Current Outpatient Medications on File Prior to Visit  Medication Sig Dispense Refill   Alpha-Lipoic Acid 600 MG CAPS Take 1 capsule by mouth daily.      amphetamine-dextroamphetamine (ADDERALL XR) 20  MG 24 hr capsule Take 1 capsule (20 mg total) by mouth in the morning. 30 capsule 0   Ascorbic Acid (VITAMIN C) 1000 MG tablet Take 1,000 mg by mouth daily.     atorvastatin (LIPITOR) 80 MG tablet Take 1 tablet by mouth daily. 90 tablet 1   azelastine (ASTELIN) 0.1 % nasal spray Place 2 sprays into both nostrils 2 (two) times daily. Use in each nostril as directed 90 mL 1   b complex vitamins tablet Take 1 tablet by mouth daily.     BAYER MICROLET LANCETS lancets USE TO TEST BLOOD SUGAR 6 TIMES A DAY 300 each 11   Beclomethasone Dipropionate (QNASL) 80 MCG/ACT AERS Place 2 puffs into the nose daily as needed (allergies). 10.6 g 5   Calcium-Magnesium-Vitamin D (CALCIUM 500 PO) Take 1 tablet by mouth daily.      Continuous Blood Gluc Transmit (DEXCOM G6 TRANSMITTER) MISC Use to monitor blood glucose, replace every 90 days. 1 each 3   cyclobenzaprine (FLEXERIL) 10 MG tablet Take 1 tablet (10 mg total) by mouth 3 (three) times daily as needed for muscle spasms. 30 tablet 0   dapagliflozin propanediol (FARXIGA) 5 MG TABS tablet Take 1 tablet (5 mg total) by mouth daily before breakfast. 90 tablet 3   famotidine (PEPCID) 40 MG tablet Take 1 tablet by mouth at bedtime.     ferrous sulfate 325 (65 FE) MG tablet Take 1 tablet (325 mg total) by mouth daily with breakfast. 90 tablet 0   glucose blood (CONTOUR NEXT TEST) test strip USE AS INSTRUCTED TO CHECK SUGAR 6 TIMES DAILY 400 strip 3   hydrOXYzine (ATARAX) 10 MG tablet Take 1 tablet by mouth at bedtime as needed. 90 tablet 0   insulin aspart (NOVOLOG)  100 UNIT/ML injection Inject max 50 units daily 50 mL 3   Insulin Lispro-aabc (LYUMJEV) 100 UNIT/ML SOLN USE UP TO 120 UNITS IN THE INSULIN PUMP DAILY 50 mL 1   levocetirizine (XYZAL) 5 MG tablet TAKE 1 TABLET BY MOUTH IN THE EVENING 90 tablet 1   losartan (COZAAR) 50 MG tablet TAKE 1 TABLET BY MOUTH DAILY. 90 tablet 1   Magnesium Oxide 400 MG CAPS Take 1 capsule (400 mg total) by mouth daily. 90 capsule 1   meloxicam (MOBIC) 15 MG tablet Take 1 tablet (15 mg total) by mouth daily. 15 tablet 0   metFORMIN (GLUCOPHAGE) 500 MG tablet Take 2 tablets (1,000 mg total) by mouth 2 (two) times daily before a meal. 360 tablet 0   NON FORMULARY CPAP     omeprazole (PRILOSEC) 40 MG capsule Take 40 mg by mouth daily.     traZODone (DESYREL) 50 MG tablet TAKE 1 TABLET BY MOUTH AT BEDTIME 90 tablet 1   Vitamin D, Ergocalciferol, (DRISDOL) 1.25 MG (50000 UNIT) CAPS capsule TAKE 1 CAPSULE BY MOUTH EVERY 7 DAYS 12 capsule 0   No current facility-administered medications on file prior to visit.   Allergies  Allergen Reactions   Lisinopril Swelling and Other (See Comments)    Extreme facial swelling causing hospitalization   Shellfish Allergy Anaphylaxis   Zetia [Ezetimibe] Other (See Comments)    ABDOMINAL CRAMPING   Dulaglutide Other (See Comments)   Gabapentin     Other reaction(s): Dizziness   Gabapentin (Once-Daily) Other (See Comments)   Iodine    Penicillins Other (See Comments)    UNSPECIFIED REACTION > Childhood allergy     Cymbalta [Duloxetine Hcl] Other (See Comments)    Severe  feet cramps   Family History  Problem Relation Age of Onset   Colon cancer Maternal Grandfather    Colon cancer Paternal Grandfather    Heart disease Father        Valve replacement; pacemaker   Alcohol abuse Neg Hx    Asthma Neg Hx    Diabetes Neg Hx    Hyperlipidemia Neg Hx    Hypertension Neg Hx    Kidney disease Neg Hx    Stroke Neg Hx    PE: BP 128/86 (BP Location: Right Arm, Patient Position:  Sitting, Cuff Size: Normal)   Pulse 70   Ht _0  (1.778 m)   Wt 235 lb (106.6 kg)   SpO2 97%   BMI 33.72 kg/m    Wt Readings from Last 3 Encounters:  03/26/22 235 lb (106.6 kg)  03/04/22 238 lb (108 kg)  01/27/22 234 lb (106.1 kg)   Constitutional: overweight, in NAD Eyes: EOMI, no exophthalmos ENT:  no thyromegaly, no cervical lymphadenopathy Cardiovascular: RRR, No MRG Respiratory: CTA B Musculoskeletal: no deformities Skin: moist, warm, no rashes Neurological: no tremor with outstretched hands  ASSESSMENT: 1. DM1, insulin-dependent, uncontrolled, without complications - DR - PN  - Barriers to good control: Very busy schedule >> less time to check sugars and eat but But he does a great job with bolusing during the day >> still has a lot of large boluses during the day, sometimes without documented carbs or sugars Lack of sleep >> usually sleeps max 5h a night (!) and tries to catch up in the weekend Reaches home late at night  - sometimes at 1 am >> eats >65% of the daily calories then  2. HL  PLAN:  1. Patient with longstanding, uncontrolled, type 1 diabetes, with high insulin resistance, managed on an insulin pump.  He switched from the Medtronic to t:slim pump before last visit.  He is on oral medication with metformin and SGLT2 inhibitor and previously also TZD (Actos), which was stopped at last visit.  At that time, HbA1c was 7.4%.  More recent HbA1c obtained 2 months ago was 7.6%, higher. -At last visit, sugars were improved significantly since the previous visit.  He still had slight increases in blood sugars after meals but these were likely due to injecting NovoLog too late after he already started to eat.  Afterwards, sugars were dropping after meals but mostly still to lower normal values.  He was back on NovoLog at last visit and we discussed about going back to Lyumjev which could be injected at the time of the meal and not 15 minutes before.  I also advised him  to not to overcorrect low blood sugars and to check blood sugars with glucometer rather than the CGM when he was correcting high or low blood sugars.  I also advised him to increase at 3 AM basal rate  and to decrease the 8 PM basal rate. CGM interpretation: -At today's visit, we reviewed his CGM downloads: It appears that 71% of values are in target range (goal >70%), while 28% are higher than 180 (goal <25%), and 1% are lower than 70 (goal <4%).  The calculated average blood sugar is 160.  The projected HbA1c for the next 3 months (GMI) is 7.0%. -Reviewing the CGM trends, sugars are mostly fluctuating within the target range but they do increase after lunch and then after dinner.  He occasionally has slight lows around 3 AM. -Reviewing the individual daily CGM and pump downloads, he  is not always bolusing before meals, but occasionally after meals, after the pump already is giving him automatic boluses.  Especially now that he is using NovoLog, he needs to inject 15 minutes before meals.  He does feel that the insulin is not sufficient before meals in the evening and we discussed about strengthening his insulin to carb ratio after 2 PM until 12 AM.  However, it also looks like he may not be introducing the entire amount of carbs into the pump and we discussed about trying to do so.  Otherwise, we can continue the current regimen for now.  With moving the boluses 15 minutes before meals, his lower blood sugars during the night likely improve. - I advised him to:  Patient Instructions  Please use the following pump settings: - basal: 12 AM: 1.15 4 AM: 1.30 6:30 AM: 1.60 8 AM: 1.90 10 AM: 1.65 12 PM: 1.45 4 PM: 1.35  9 PM: 1.55 10:30 PM: 1.45 - bolus:  - Insulin to carb ratio: 1:3, except 2 pm: 1:2.5 - Insulin sensitivity factor:  25  - target: 120-120 - Active insulin time: 3 hours  Please continue: - Metformin 1000 mg 2x with meals  - Farxiga 5 mg before breakfast  Please come back for a  follow-up appointment in 4 months.  - we checked his HbA1c: 7.1% (placed in a long time) - advised to check sugars at different times of the day - 4x a day, rotating check times - advised for yearly eye exams >> he is UTD - return to clinic in 4 months  2. HL -Latest lipid fractions all at goal: Lab Results  Component Value Date   CHOL 94 01/27/2022   HDL 40.70 01/27/2022   LDLCALC 34 01/27/2022   LDLDIRECT 87.0 12/02/2016   TRIG 98.0 01/27/2022   CHOLHDL 2 01/27/2022  -continues on Lipitor 80 mg daily w/o SEs  Philemon Kingdom, MD PhD [facility]

## 2022-03-29 ENCOUNTER — Other Ambulatory Visit: Payer: Self-pay | Admitting: Internal Medicine

## 2022-03-29 ENCOUNTER — Other Ambulatory Visit (HOSPITAL_COMMUNITY): Payer: Self-pay

## 2022-03-29 ENCOUNTER — Other Ambulatory Visit: Payer: Self-pay | Admitting: Family Medicine

## 2022-03-29 DIAGNOSIS — F900 Attention-deficit hyperactivity disorder, predominantly inattentive type: Secondary | ICD-10-CM

## 2022-03-29 MED ORDER — AMPHETAMINE-DEXTROAMPHET ER 20 MG PO CP24
20.0000 mg | ORAL_CAPSULE | Freq: Every morning | ORAL | 0 refills | Status: DC
  Filled 2022-03-29 – 2022-04-03 (×3): qty 30, 30d supply, fill #0

## 2022-03-30 ENCOUNTER — Other Ambulatory Visit (HOSPITAL_COMMUNITY): Payer: Self-pay

## 2022-03-30 MED ORDER — VITAMIN D (ERGOCALCIFEROL) 1.25 MG (50000 UNIT) PO CAPS
ORAL_CAPSULE | ORAL | 0 refills | Status: DC
  Filled 2022-03-30: qty 12, 84d supply, fill #0

## 2022-03-31 ENCOUNTER — Other Ambulatory Visit (HOSPITAL_COMMUNITY): Payer: Self-pay

## 2022-04-01 ENCOUNTER — Encounter: Payer: Self-pay | Admitting: Physical Therapy

## 2022-04-01 ENCOUNTER — Ambulatory Visit (INDEPENDENT_AMBULATORY_CARE_PROVIDER_SITE_OTHER): Payer: 59 | Admitting: Physical Therapy

## 2022-04-01 DIAGNOSIS — M5459 Other low back pain: Secondary | ICD-10-CM | POA: Diagnosis not present

## 2022-04-01 NOTE — Therapy (Signed)
OUTPATIENT PHYSICAL THERAPY LOWER EXTREMITY TREATMENT   Patient Name: Zachary Graham MRN: [id number] DOB:[birth year], 61 y.o., male Today's Date: 04/01/2022   PT End of Session - 04/01/22 0755     Visit Number 3    Number of Visits 16    Date for PT Re-Evaluation 05/13/22    Authorization Type [employer]- [employer]    PT Start Time [phone]    PT Stop Time 0845    PT Time Calculation (min) 47 min    Activity Tolerance Patient tolerated treatment well    Behavior During Therapy WFL for tasks assessed/performed              Past Medical History:  Diagnosis Date   Diabetes mellitus    GERD (gastroesophageal reflux disease)    Heart murmur    Hyperlipidemia    Hypertension    OSA (obstructive sleep apnea)    Pneumonia    Past Surgical History:  Procedure Laterality Date   CERVICAL DISC ARTHROPLASTY N/A 03/16/2017   Procedure: CERVICAL FIVE- CERVICAL SIX [place] ARTHROPLASTY;  Surgeon: Jovita Gamma, MD;  Location: [place];  Service: Neurosurgery;  Laterality: N/A;  CERVICAL 5- CERVICAL 6 DISC ARTHROPLASTY   COLONOSCOPY     TONSILLECTOMY     Patient Active Problem List   Diagnosis Date Noted   Preglaucoma, unspecified, bilateral 01/14/2022   Seborrheic keratosis 01/14/2022   Type 1 diabetes mellitus with hyperglycemia ([place]) 01/14/2022   Vasomotor rhinitis 01/14/2022   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/2022   Adult attention deficit hyperactivity disorder 01/14/2022   Allergy to shellfish 01/14/2022   Dysphagia, oropharyngeal phase 01/14/2022   Gastro-esophageal reflux disease without esophagitis 01/14/2022   Low back pain 10/29/2021   Iron deficiency anemia secondary to inadequate dietary iron intake 02/02/2021   Frequent PVCs 10/28/2020   Hypertension    Nonallopathic lesion of thoracic region 04/28/2020   Chronic prostatitis without hematuria 05/08/2019   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 ([place]) 03/20/2018   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 edema associated with type 2 diabetes mellitus ([place]) 08/31/2017   Mild cataract of right eye 08/31/2017   HNP (herniated nucleus pulposus), cervical 03/16/2017   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 ([place]) 01/06/2017   Herniation of cervical intervertebral disc with radiculopathy 01/04/2017   Current mild episode of major depressive disorder without prior episode ([place]) 12/02/2016   Diabetic neuropathy ([place]) 03/01/2016   Obesity, Class II, 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.9, with comorbidity (see actual BMI) 04/24/2015   Type 1 diabetes mellitus with hyperglycemia, with long-term current use of insulin ([place]) 04/04/2015   Patellofemoral stress syndrome of left knee 03/19/2015   Allergic rhinitis due to pollen 11/14/2014   Equinus deformity of foot, acquired 05/29/2014   ADHD (attention deficit hyperactivity disorder), inattentive type 10/24/2013   Vitamin D deficiency 02/28/2013   Pulmonic stenosis 05/25/2012   Pure hyperglyceridemia 05/25/2012   Hyperlipidemia with target LDL less than 100 09/20/2011   OSA (obstructive sleep apnea) 09/20/2011   BPH (benign prostatic hyperplasia) 09/20/2011   Routine general medical examination at a health care facility 09/20/2011    PCP: Scarlette Calico, MD  REFERRING PROVIDER: [name]  REFERRING DIAG: Low back pain   THERAPY DIAG:  Other low back pain  Rationale for Evaluation and Treatment: Rehabilitation  ONSET DATE:   SUBJECTIVE:   SUBJECTIVE STATEMENT:  Pt states less soreness in back . Has been doing HEP.    Eval: End  of August, pt bent over to reach a piece of paper on floor, had significant back pain, L side. Has had only very mild previous back pain.  Now: significantly better, just having mild Low back soreness bilaterally He states he is still not able, and is fearful for forward bending.  Pt works very long hours.  Joined gym: [employer] ,  has not been much due to hurting his back.  Neck: doing well . L shoulder: states limited for elevation   PERTINENT HISTORY: HTN, DM   PAIN:  Are you having pain? Yes: NPRS scale: 2/10 Pain location: bil low back Pain description: sore, tight Aggravating factors: increased activity/end of the day  Relieving factors: none stated     PRECAUTIONS: None  WEIGHT BEARING RESTRICTIONS: No  FALLS:  Has patient fallen in last 6 months? No   PLOF: Independent  PATIENT GOALS: Improve mobility, pain   OBJECTIVE:   DIAGNOSTIC FINDINGS:    COGNITION: Overall cognitive status: Within functional limits for tasks assessed     POSTURE: stiff back and upper body posture, mild fwd head and rounded shoulders    PALPATION: Tightness in bil HS R(significant) >L.   Hypomobile R hip flex and IR  Soreness in central T and L spine with PA s.   LOWER EXTREMITY ROM:  Lumbar: flexion: significant limitation: 70 deg,        Extension: mod/significant limitation      SB: mod limitation bil  Active ROM Right eval Left eval  Hip flexion Mild/mod limitation WFL  Hip extension    Hip abduction    Hip adduction    Hip internal rotation Significant limitation Mild limitation  Hip external rotation [facility]  Knee flexion [facility] WFL  Knee extension [facility]  Ankle dorsiflexion    Ankle plantarflexion    Ankle inversion    Ankle eversion     (Blank rows = not tested)  LOWER EXTREMITY MMT:  MMT Right eval Left eval  Hip flexion 4 4  Hip extension    Hip abduction 4 4  Hip adduction    Hip internal rotation    Hip external rotation    Knee flexion 5 5  Knee extension 5 5  Ankle dorsiflexion    Ankle plantarflexion    Ankle inversion    Ankle eversion     (Blank rows = not tested)  LOWER EXTREMITY SPECIAL TESTS:  Significant tightness R>L Hamstrings,  Neg SLR,   Hypomobile R hip flex and IR,    TODAY'S TREATMENT:                                                                                                                                  04/01/22: Therapeutic Exercise: Aerobic: Supine: Bridging x 15; Supine March with TA x 15; SLR with TA 2x10 bil;  TA with education on mechanics  and breathing x 15;  Seated: Sit to stand x  10 with education on mechanics;  Standing:  Rows GTB x 20;    Stretches: Seated HSS 30 sec x 3 bil;  seated lumbar fwd flexion 30 sec x 3;  LTR x 20;   SKTC 30 sec x 3 bil with towel;  Neuromuscular Re-education: Manual Therapy: Hip ROM , flex and IR stretching bil;     03/26/22 Therapeutic Exercise: Aerobic: Supine: Bridging x 15;  Seated: Sit to stand x 10 with education on mechanics;  Standing:  Rows GTB x 20;  mini squat x 15 with education on mechanics;  Stretches: Seated HSS 30 sec x 3 bil; Seated piriformis 30 sec x 3 bil; seated lumbar fwd flexion 30 sec x 3;  LTR x 20;  SKTC 30 sec x 3 bil with towel;  Neuromuscular Re-education: Manual Therapy:  long leg distraction x 1 min bil; hip IR stretching bil;    PATIENT EDUCATION:  Education details: reviewed HEP Person educated: Patient Education method: Explanation, Demonstration, Tactile cues, Verbal cues, and Handouts Education comprehension: verbalized understanding, returned demonstration, verbal cues required, tactile cues required, and needs further education   HOME EXERCISE PROGRAM:  Access Code: [email or web address]   ASSESSMENT:   CLINICAL IMPRESSION: 04/01/2022 Focus for education on TA contraction today. Pt challenged with this, will benefit from continued practice with this for stability and back pain. Improved ROM for lumbar flexion today from previous weeks. Pt to benefit from continued work and education on lumbar and hip mobility and strengthening.   Eval: Patient presents with primary complaint of increased pain in low back. He did have significant pain in previous months that has improved. He has significant movement limitations, with limited and fearful  flexion and bending. He has decreased core strength, and lack of effective HEP for his diagnosis. Pt to benefit from skilled PT to improve deficits and pain.   OBJECTIVE IMPAIRMENTS: decreased activity tolerance, decreased mobility, decreased ROM, decreased strength, hypomobility, increased muscle spasms, impaired flexibility, improper body mechanics, and pain.   ACTIVITY LIMITATIONS: lifting, bending, standing, squatting, sleeping, stairs, and locomotion level  PARTICIPATION LIMITATIONS: laundry, shopping, community activity, and yard work  PERSONAL FACTORS: none are also affecting patient's functional outcome.   REHAB POTENTIAL: Good  CLINICAL DECISION MAKING: Stable/uncomplicated  EVALUATION COMPLEXITY: Low   GOALS: Goals reviewed with patient? Yes  SHORT TERM GOALS: Target date: 03/31/2022   Pt to be independent with initial HEP  Goal status: INITIAL    LONG TERM GOALS: Target date: 05/12/2022   Pt to be independent with final HEP  Goal status: INITIAL  2.  Pt to demo improved ROM for lumbar flexion to be [facility] for pt age.   Goal status: INITIAL  3.  Pt to demo ability for bend/lift/squat with optimal mechanics, to improve ability for IADLS and work duties   Goal status: INITIAL  4.  Pt to report decreased pain in back to 0-2/10 with activity and exercise.  Goal status: INITIAL    PLAN:  PT FREQUENCY: 1-2x/week  PT DURATION: 8 weeks  PLANNED INTERVENTIONS: Therapeutic exercises, Therapeutic activity, Neuromuscular re-education, Gait training, Patient/Family education, Self Care, Joint mobilization, Joint manipulation, Stair training, Orthotic/Fit training, DME instructions, Dry Needling, Electrical stimulation, Spinal manipulation, Spinal mobilization, Cryotherapy, Moist heat, Taping, Vasopneumatic device, Traction, Ultrasound, Ionotophoresis '4mg'$ /ml Dexamethasone, and Manual therapy  PLAN FOR NEXT SESSION:   Lyndee Hensen, PT, DPT 2:38 PM  04/01/22

## 2022-04-03 ENCOUNTER — Other Ambulatory Visit: Payer: Self-pay | Admitting: Internal Medicine

## 2022-04-03 ENCOUNTER — Other Ambulatory Visit (HOSPITAL_COMMUNITY): Payer: Self-pay

## 2022-04-03 ENCOUNTER — Other Ambulatory Visit: Payer: Self-pay | Admitting: Family Medicine

## 2022-04-03 DIAGNOSIS — E785 Hyperlipidemia, unspecified: Secondary | ICD-10-CM

## 2022-04-03 MED ORDER — ATORVASTATIN CALCIUM 80 MG PO TABS
80.0000 mg | ORAL_TABLET | Freq: Every day | ORAL | 1 refills | Status: DC
  Filled 2022-04-03: qty 90, 90d supply, fill #0
  Filled 2022-07-03: qty 90, 90d supply, fill #1

## 2022-04-05 ENCOUNTER — Other Ambulatory Visit (HOSPITAL_COMMUNITY): Payer: Self-pay

## 2022-04-05 ENCOUNTER — Ambulatory Visit (INDEPENDENT_AMBULATORY_CARE_PROVIDER_SITE_OTHER): Payer: 59 | Admitting: Physical Therapy

## 2022-04-05 ENCOUNTER — Encounter: Payer: Self-pay | Admitting: Physical Therapy

## 2022-04-05 DIAGNOSIS — M5459 Other low back pain: Secondary | ICD-10-CM | POA: Diagnosis not present

## 2022-04-05 MED ORDER — HYDROXYZINE HCL 10 MG PO TABS
10.0000 mg | ORAL_TABLET | Freq: Every evening | ORAL | 0 refills | Status: AC | PRN
  Filled 2022-04-05: qty 90, 90d supply, fill #0

## 2022-04-05 NOTE — Therapy (Signed)
OUTPATIENT PHYSICAL THERAPY LOWER EXTREMITY TREATMENT   Patient Name: JHORDAN MCKIBBEN MRN: [id number] DOB:12-Jan-1961, 61 y.o., male Today's Date: 04/05/2022   PT End of Session - 04/05/22 1133     Visit Number 4    Number of Visits 16    Date for PT Re-Evaluation 05/13/22    Authorization Type [employer]- [employer]    PT Start Time [phone]    PT Stop Time [phone]    PT Time Calculation (min) 45 min    Activity Tolerance Patient tolerated treatment well    Behavior During Therapy WFL for tasks assessed/performed               Past Medical History:  Diagnosis Date   Diabetes mellitus    GERD (gastroesophageal reflux disease)    Heart murmur    Hyperlipidemia    Hypertension    OSA (obstructive sleep apnea)    Pneumonia    Past Surgical History:  Procedure Laterality Date   CERVICAL DISC ARTHROPLASTY N/A 03/16/2017   Procedure: CERVICAL FIVE- CERVICAL SIX [place] ARTHROPLASTY;  Surgeon: Jovita Gamma, MD;  Location: [facility];  Service: Neurosurgery;  Laterality: N/A;  CERVICAL 5- CERVICAL 6 DISC ARTHROPLASTY   COLONOSCOPY     TONSILLECTOMY     Patient Active Problem List   Diagnosis Date Noted   Preglaucoma, unspecified, bilateral 01/14/2022   Seborrheic keratosis 01/14/2022   Type 1 diabetes mellitus with hyperglycemia ([place]) 01/14/2022   Vasomotor rhinitis 01/14/2022   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/2022   Adult attention deficit hyperactivity disorder 01/14/2022   Allergy to shellfish 01/14/2022   Dysphagia, oropharyngeal phase 01/14/2022   Gastro-esophageal reflux disease without esophagitis 01/14/2022   Low back pain 10/29/2021   Iron deficiency anemia secondary to inadequate dietary iron intake 02/02/2021   Frequent PVCs 10/28/2020   Hypertension    Nonallopathic lesion of thoracic region 04/28/2020   Chronic prostatitis without hematuria 05/08/2019   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 ([place]) 03/20/2018   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 edema associated with type 2 diabetes mellitus ([place]) 08/31/2017   Mild cataract of right eye 08/31/2017   HNP (herniated nucleus pulposus), cervical 03/16/2017   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 ([place]) 01/06/2017   Herniation of cervical intervertebral disc with radiculopathy 01/04/2017   Current mild episode of major depressive disorder without prior episode ([place]) 12/02/2016   Diabetic neuropathy ([place]) 03/01/2016   Obesity, Class II, BMI 35.0-39.9, with comorbidity (see actual BMI) 04/24/2015   Type 1 diabetes mellitus with hyperglycemia, with long-term current use of insulin ([place]) 04/04/2015   Patellofemoral stress syndrome of left knee 03/19/2015   Allergic rhinitis due to pollen 11/14/2014   Equinus deformity of foot, acquired 05/29/2014   ADHD (attention deficit hyperactivity disorder), inattentive type 10/24/2013   Vitamin D deficiency 02/28/2013   Pulmonic stenosis 05/25/2012   Pure hyperglyceridemia 05/25/2012   Hyperlipidemia with target LDL less than 100 09/20/2011   OSA (obstructive sleep apnea) 09/20/2011   BPH (benign prostatic hyperplasia) 09/20/2011   Routine general medical examination at a health care facility 09/20/2011    PCP: Scarlette Calico, MD  REFERRING PROVIDER: [name]  REFERRING DIAG: Low back pain   THERAPY DIAG:  Other low back pain  Rationale for Evaluation and Treatment: Rehabilitation  ONSET DATE:   SUBJECTIVE:   SUBJECTIVE STATEMENT:  No new complaints. Thinks he is moving a bit better. Hips are still very stiff.  Eval: End of August, pt bent over to reach a piece of paper on floor, had significant back pain, L side. Has had only very mild previous back pain.  Now: significantly better, just having mild Low back soreness bilaterally He states he is still not able, and is fearful for forward bending.  Pt works very long hours.   Joined gym: [employer] , has not been much due to hurting his back.  Neck: doing well . L shoulder: states limited for elevation   PERTINENT HISTORY: HTN, DM   PAIN:  Are you having pain? Yes: NPRS scale: 2/10 Pain location: bil low back Pain description: sore, tight Aggravating factors: increased activity/end of the day  Relieving factors: none stated     PRECAUTIONS: None  WEIGHT BEARING RESTRICTIONS: No  FALLS:  Has patient fallen in last 6 months? No   PLOF: Independent  PATIENT GOALS: Improve mobility, pain   OBJECTIVE:   DIAGNOSTIC FINDINGS:    COGNITION: Overall cognitive status: Within functional limits for tasks assessed     POSTURE: stiff back and upper body posture, mild fwd head and rounded shoulders    PALPATION: Tightness in bil HS R(significant) >L.   Hypomobile R hip flex and IR  Soreness in central T and L spine with PA s.   LOWER EXTREMITY ROM:  Lumbar: flexion: significant limitation: 70 deg,        Extension: mod/significant limitation      SB: mod limitation bil  Active ROM Right eval Left eval  Hip flexion Mild/mod limitation WFL  Hip extension    Hip abduction    Hip adduction    Hip internal rotation Significant limitation Mild limitation  Hip external rotation [facility]  Knee flexion [facility] WFL  Knee extension [facility]  Ankle dorsiflexion    Ankle plantarflexion    Ankle inversion    Ankle eversion     (Blank rows = not tested)  LOWER EXTREMITY MMT:  MMT Right eval Left eval  Hip flexion 4 4  Hip extension    Hip abduction 4 4  Hip adduction    Hip internal rotation    Hip external rotation    Knee flexion 5 5  Knee extension 5 5  Ankle dorsiflexion    Ankle plantarflexion    Ankle inversion    Ankle eversion     (Blank rows = not tested)  LOWER EXTREMITY SPECIAL TESTS:  Significant tightness R>L Hamstrings,  Neg SLR,   Hypomobile R hip flex and IR,    TODAY'S TREATMENT:                                                                                                                                  04/05/22: Therapeutic Exercise: Aerobic: Supine: Bridging x 15;    Seated: Sit to stand x 10 ; Standing:  Rows GTB x 20;   Squats x 15;  Stretches: Seated HSS 30  sec x 3 bil;  seated lumbar fwd flexion 30 sec x 3;  LTR x 10;   ; Hip IR/ER fallout/ins x 5 min, with education on HEP; ITB 30 sec x 3 bil for hip IR;  Neuromuscular Re-education: Manual Therapy: Hip PROM , flex and IR stretching bil;     03/26/22 Therapeutic Exercise: Aerobic: Supine: Bridging x 15;  Seated: Sit to stand x 10 with education on mechanics;  Standing:  Rows GTB x 20;  mini squat x 15 with education on mechanics;  Stretches: Seated HSS 30 sec x 3 bil; Seated piriformis 30 sec x 3 bil; seated lumbar fwd flexion 30 sec x 3;  LTR x 20;  SKTC 30 sec x 3 bil with towel;  Neuromuscular Re-education: Manual Therapy:  long leg distraction x 1 min bil; hip IR stretching bil;    PATIENT EDUCATION:  Education details: reviewed HEP Person educated: Patient Education method: Explanation, Demonstration, Tactile cues, Verbal cues, and Handouts Education comprehension: verbalized understanding, returned demonstration, verbal cues required, tactile cues required, and needs further education   HOME EXERCISE PROGRAM:  Access Code: [email or web address]   ASSESSMENT:   CLINICAL IMPRESSION: 04/05/2022 Focus on ROM for hip IR today, with ther ex, and manual stretching, with education on continuing mobility for HEP. Pt with significant ROM loss bil, L>R for IR. Showing improvements with ability for initial HEP and mobility for back and hamstrings.   Eval: Patient presents with primary complaint of increased pain in low back. He did have significant pain in previous months that has improved. He has significant movement limitations, with limited and fearful flexion and bending. He has decreased core strength, and lack of effective HEP for his  diagnosis. Pt to benefit from skilled PT to improve deficits and pain.   OBJECTIVE IMPAIRMENTS: decreased activity tolerance, decreased mobility, decreased ROM, decreased strength, hypomobility, increased muscle spasms, impaired flexibility, improper body mechanics, and pain.   ACTIVITY LIMITATIONS: lifting, bending, standing, squatting, sleeping, stairs, and locomotion level  PARTICIPATION LIMITATIONS: laundry, shopping, community activity, and yard work  PERSONAL FACTORS: none are also affecting patient's functional outcome.   REHAB POTENTIAL: Good  CLINICAL DECISION MAKING: Stable/uncomplicated  EVALUATION COMPLEXITY: Low   GOALS: Goals reviewed with patient? Yes  SHORT TERM GOALS: Target date: 03/31/2022   Pt to be independent with initial HEP  Goal status: INITIAL    LONG TERM GOALS: Target date: 05/12/2022   Pt to be independent with final HEP  Goal status: INITIAL  2.  Pt to demo improved ROM for lumbar flexion to be [facility] for pt age.   Goal status: INITIAL  3.  Pt to demo ability for bend/lift/squat with optimal mechanics, to improve ability for IADLS and work duties   Goal status: INITIAL  4.  Pt to report decreased pain in back to 0-2/10 with activity and exercise.  Goal status: INITIAL    PLAN:  PT FREQUENCY: 1-2x/week  PT DURATION: 8 weeks  PLANNED INTERVENTIONS: Therapeutic exercises, Therapeutic activity, Neuromuscular re-education, Gait training, Patient/Family education, Self Care, Joint mobilization, Joint manipulation, Stair training, Orthotic/Fit training, DME instructions, Dry Needling, Electrical stimulation, Spinal manipulation, Spinal mobilization, Cryotherapy, Moist heat, Taping, Vasopneumatic device, Traction, Ultrasound, Ionotophoresis '4mg'$ /ml Dexamethasone, and Manual therapy  PLAN FOR NEXT SESSION:   Lyndee Hensen, PT, DPT 11:34 AM  04/05/22

## 2022-04-09 ENCOUNTER — Other Ambulatory Visit (HOSPITAL_COMMUNITY): Payer: Self-pay

## 2022-04-09 ENCOUNTER — Other Ambulatory Visit: Payer: Self-pay | Admitting: Internal Medicine

## 2022-04-09 DIAGNOSIS — M545 Low back pain, unspecified: Secondary | ICD-10-CM

## 2022-04-12 ENCOUNTER — Encounter: Payer: Self-pay | Admitting: Physical Therapy

## 2022-04-12 ENCOUNTER — Other Ambulatory Visit (HOSPITAL_COMMUNITY): Payer: Self-pay

## 2022-04-12 ENCOUNTER — Ambulatory Visit (INDEPENDENT_AMBULATORY_CARE_PROVIDER_SITE_OTHER): Payer: 59 | Admitting: Physical Therapy

## 2022-04-12 DIAGNOSIS — G8929 Other chronic pain: Secondary | ICD-10-CM

## 2022-04-12 DIAGNOSIS — M25562 Pain in left knee: Secondary | ICD-10-CM

## 2022-04-12 DIAGNOSIS — M5459 Other low back pain: Secondary | ICD-10-CM

## 2022-04-12 NOTE — Therapy (Signed)
OUTPATIENT PHYSICAL THERAPY LOWER EXTREMITY TREATMENT   Patient Name: Zachary Graham MRN: [id number] DOB:May 10, 1961, 61 y.o., male Today's Date: 04/12/2022   PT End of Session - 04/12/22 0806     Visit Number 5    Number of Visits 16    Date for PT Re-Evaluation 05/13/22    Authorization Type [name]- [employer]    PT Start Time [phone]    PT Stop Time [phone]    PT Time Calculation (min) 40 min    Activity Tolerance Patient tolerated treatment well    Behavior During Therapy WFL for tasks assessed/performed               Past Medical History:  Diagnosis Date   Diabetes mellitus    GERD (gastroesophageal reflux disease)    Heart murmur    Hyperlipidemia    Hypertension    OSA (obstructive sleep apnea)    Pneumonia    Past Surgical History:  Procedure Laterality Date   CERVICAL DISC ARTHROPLASTY N/A 03/16/2017   Procedure: CERVICAL FIVE- CERVICAL SIX [place] ARTHROPLASTY;  Surgeon: Jovita Gamma, MD;  Location: [facility];  Service: Neurosurgery;  Laterality: N/A;  CERVICAL 5- CERVICAL 6 DISC ARTHROPLASTY   COLONOSCOPY     TONSILLECTOMY     Patient Active Problem List   Diagnosis Date Noted   Preglaucoma, unspecified, bilateral 01/14/2022   Seborrheic keratosis 01/14/2022   Type 1 diabetes mellitus with hyperglycemia ([place]) 01/14/2022   Vasomotor rhinitis 01/14/2022   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/2022   Adult attention deficit hyperactivity disorder 01/14/2022   Allergy to shellfish 01/14/2022   Dysphagia, oropharyngeal phase 01/14/2022   Gastro-esophageal reflux disease without esophagitis 01/14/2022   Low back pain 10/29/2021   Iron deficiency anemia secondary to inadequate dietary iron intake 02/02/2021   Frequent PVCs 10/28/2020   Hypertension    Nonallopathic lesion of thoracic region 04/28/2020   Chronic prostatitis without hematuria 05/08/2019   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 ([place]) 03/20/2018   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 edema associated with type 2 diabetes mellitus ([place]) 08/31/2017   Mild cataract of right eye 08/31/2017   HNP (herniated nucleus pulposus), cervical 03/16/2017   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 ([place]) 01/06/2017   Herniation of cervical intervertebral disc with radiculopathy 01/04/2017   Current mild episode of major depressive disorder without prior episode ([place]) 12/02/2016   Diabetic neuropathy ([place]) 03/01/2016   Obesity, Class II, BMI 35.0-39.9, with comorbidity (see actual BMI) 04/24/2015   Type 1 diabetes mellitus with hyperglycemia, with long-term current use of insulin ([place]) 04/04/2015   Patellofemoral stress syndrome of left knee 03/19/2015   Allergic rhinitis due to pollen 11/14/2014   Equinus deformity of foot, acquired 05/29/2014   ADHD (attention deficit hyperactivity disorder), inattentive type 10/24/2013   Vitamin D deficiency 02/28/2013   Pulmonic stenosis 05/25/2012   Pure hyperglyceridemia 05/25/2012   Hyperlipidemia with target LDL less than 100 09/20/2011   OSA (obstructive sleep apnea) 09/20/2011   BPH (benign prostatic hyperplasia) 09/20/2011   Routine general medical examination at a health care facility 09/20/2011    PCP: Scarlette Calico, MD  REFERRING PROVIDER: [name]  REFERRING DIAG: Low back pain   THERAPY DIAG:  Other low back pain  Chronic pain of left knee  Rationale for Evaluation and Treatment: Rehabilitation  ONSET DATE:   SUBJECTIVE:   SUBJECTIVE STATEMENT:  No new complaints. Has been doing HEP, has mild  pain in R shoulder blade from lifting something at work .     Eval: End of August, pt bent over to reach a piece of paper on floor, had significant back pain, L side. Has had only very mild previous back pain.  Now: significantly better, just having mild Low back soreness bilaterally He states he is still not able, and is  fearful for forward bending.  Pt works very long hours.  Joined gym: [employer] , has not been much due to hurting his back.  Neck: doing well . L shoulder: states limited for elevation   PERTINENT HISTORY: HTN, DM   PAIN:  Are you having pain? Yes: NPRS scale: 2/10 Pain location: bil low back Pain description: sore, tight Aggravating factors: increased activity/end of the day  Relieving factors: none stated     PRECAUTIONS: None  WEIGHT BEARING RESTRICTIONS: No  FALLS:  Has patient fallen in last 6 months? No   PLOF: Independent  PATIENT GOALS: Improve mobility, pain   OBJECTIVE:   DIAGNOSTIC FINDINGS:    COGNITION: Overall cognitive status: Within functional limits for tasks assessed     POSTURE: stiff back and upper body posture, mild fwd head and rounded shoulders    PALPATION: Tightness in bil HS R(significant) >L.   Hypomobile R hip flex and IR  Soreness in central T and L spine with PA s.   LOWER EXTREMITY ROM:  Lumbar: flexion: significant limitation: 70 deg,        Extension: mod/significant limitation      SB: mod limitation bil  Active ROM Right eval Left eval  Hip flexion Mild/mod limitation WFL  Hip extension    Hip abduction    Hip adduction    Hip internal rotation Significant limitation Mild limitation  Hip external rotation [facility]  Knee flexion [facility] WFL  Knee extension [facility]  Ankle dorsiflexion    Ankle plantarflexion    Ankle inversion    Ankle eversion     (Blank rows = not tested)  LOWER EXTREMITY MMT:  MMT Right eval Left eval  Hip flexion 4 4  Hip extension    Hip abduction 4 4  Hip adduction    Hip internal rotation    Hip external rotation    Knee flexion 5 5  Knee extension 5 5  Ankle dorsiflexion    Ankle plantarflexion    Ankle inversion    Ankle eversion     (Blank rows = not tested)  LOWER EXTREMITY SPECIAL TESTS:  Significant tightness R>L Hamstrings,  Neg SLR,   Hypomobile R hip flex and IR,     TODAY'S TREATMENT:                                                                                                                                04/12/22: Therapeutic Exercise: Aerobic: Supine: Bridging x 15;   Hip add towel squeeze for IR x 20;  Seated:  Sit to stand x 10 with cueing to decrease hip ER ; hip add/IR squeeze x 10;  Standing:  Rows GTB x 20;    Stretches: Seated HSS 30 sec x 3 bil;  seated lumbar and hip flexion foot on step, 30 sec x 5 on R;   LTR x 15;   ; Hip IR/ER fallout/ins x 5 min, with education on HEP;   gastroc stretch at wall 30 sec x 3;  Neuromuscular Re-education: Manual Therapy: Hip PROM ,  flex and IR stretching bil;  long leg distraction for hips bil;     04/05/22: Therapeutic Exercise: Aerobic: Supine: Bridging x 15;    Seated: Sit to stand x 10 ; Standing:  Rows GTB x 20;   Squats x 15;  Stretches: Seated HSS 30 sec x 3 bil;  seated lumbar fwd flexion 30 sec x 3;  LTR x 10;   ; Hip IR/ER fallout/ins x 5 min, with education on HEP; ITB 30 sec x 3 bil for hip IR;  Neuromuscular Re-education: Manual Therapy: Hip PROM , flex and IR stretching bil;     03/26/22 Therapeutic Exercise: Aerobic: Supine: Bridging x 15;  Seated: Sit to stand x 10 with education on mechanics;  Standing:  Rows GTB x 20;  mini squat x 15 with education on mechanics;  Stretches: Seated HSS 30 sec x 3 bil; Seated piriformis 30 sec x 3 bil; seated lumbar fwd flexion 30 sec x 3;  LTR x 20;  SKTC 30 sec x 3 bil with towel;  Neuromuscular Re-education: Manual Therapy:  long leg distraction x 1 min bil; hip IR stretching bil;    PATIENT EDUCATION:  Education details: reviewed HEP Person educated: Patient Education method: Explanation, Demonstration, Tactile cues, Verbal cues, and Handouts Education comprehension: verbalized understanding, returned demonstration, verbal cues required, tactile cues required, and needs further education   HOME EXERCISE PROGRAM:  Access  Code: [email or web address]   ASSESSMENT:   CLINICAL IMPRESSION: 04/12/2022 Focus on ROM for hip IR today, with ther ex, and manual stretching, with education on importance of mobility. Pt to benefit from progressing ther ex for mobility and strength for functional activity.   Eval: Patient presents with primary complaint of increased pain in low back. He did have significant pain in previous months that has improved. He has significant movement limitations, with limited and fearful flexion and bending. He has decreased core strength, and lack of effective HEP for his diagnosis. Pt to benefit from skilled PT to improve deficits and pain.   OBJECTIVE IMPAIRMENTS: decreased activity tolerance, decreased mobility, decreased ROM, decreased strength, hypomobility, increased muscle spasms, impaired flexibility, improper body mechanics, and pain.   ACTIVITY LIMITATIONS: lifting, bending, standing, squatting, sleeping, stairs, and locomotion level  PARTICIPATION LIMITATIONS: laundry, shopping, community activity, and yard work  PERSONAL FACTORS: none are also affecting patient's functional outcome.   REHAB POTENTIAL: Good  CLINICAL DECISION MAKING: Stable/uncomplicated  EVALUATION COMPLEXITY: Low   GOALS: Goals reviewed with patient? Yes  SHORT TERM GOALS: Target date: 03/31/2022   Pt to be independent with initial HEP  Goal status: INITIAL    LONG TERM GOALS: Target date: 05/12/2022   Pt to be independent with final HEP  Goal status: INITIAL  2.  Pt to demo improved ROM for lumbar flexion to be [facility] for pt age.   Goal status: INITIAL  3.  Pt to demo ability for bend/lift/squat with optimal mechanics, to improve ability for IADLS and work duties   Goal status:  INITIAL  4.  Pt to report decreased pain in back to 0-2/10 with activity and exercise.  Goal status: INITIAL    PLAN:  PT FREQUENCY: 1-2x/week  PT DURATION: 8 weeks  PLANNED INTERVENTIONS: Therapeutic exercises,  Therapeutic activity, Neuromuscular re-education, Gait training, Patient/Family education, Self Care, Joint mobilization, Joint manipulation, Stair training, Orthotic/Fit training, DME instructions, Dry Needling, Electrical stimulation, Spinal manipulation, Spinal mobilization, Cryotherapy, Moist heat, Taping, Vasopneumatic device, Traction, Ultrasound, Ionotophoresis '4mg'$ /ml Dexamethasone, and Manual therapy  PLAN FOR NEXT SESSION:   Lyndee Hensen, PT, DPT 9:32 PM  04/12/22

## 2022-04-19 ENCOUNTER — Ambulatory Visit (INDEPENDENT_AMBULATORY_CARE_PROVIDER_SITE_OTHER): Payer: 59 | Admitting: Physical Therapy

## 2022-04-19 ENCOUNTER — Encounter: Payer: Self-pay | Admitting: Physical Therapy

## 2022-04-19 ENCOUNTER — Other Ambulatory Visit (HOSPITAL_COMMUNITY): Payer: Self-pay

## 2022-04-19 ENCOUNTER — Other Ambulatory Visit: Payer: Self-pay | Admitting: Family Medicine

## 2022-04-19 DIAGNOSIS — M5459 Other low back pain: Secondary | ICD-10-CM

## 2022-04-19 NOTE — Therapy (Signed)
OUTPATIENT PHYSICAL THERAPY LOWER EXTREMITY TREATMENT   Patient Name: KYZEN HORN MRN: [id number] DOB:01-07-1961, 61 y.o., male Today's Date: 04/19/2022   PT End of Session - 04/19/22 0804     Visit Number 6    Number of Visits 16    Date for PT Re-Evaluation 05/13/22    Authorization Type [employer]- [employer]    PT Start Time 0805    PT Stop Time 0845    PT Time Calculation (min) 40 min    Activity Tolerance Patient tolerated treatment well    Behavior During Therapy WFL for tasks assessed/performed               Past Medical History:  Diagnosis Date   Diabetes mellitus    GERD (gastroesophageal reflux disease)    Heart murmur    Hyperlipidemia    Hypertension    OSA (obstructive sleep apnea)    Pneumonia    Past Surgical History:  Procedure Laterality Date   CERVICAL DISC ARTHROPLASTY N/A 03/16/2017   Procedure: CERVICAL FIVE- CERVICAL SIX [place] ARTHROPLASTY;  Surgeon: Jovita Gamma, MD;  Location: [facility];  Service: Neurosurgery;  Laterality: N/A;  CERVICAL 5- CERVICAL 6 DISC ARTHROPLASTY   COLONOSCOPY     TONSILLECTOMY     Patient Active Problem List   Diagnosis Date Noted   Preglaucoma, unspecified, bilateral 01/14/2022   Seborrheic keratosis 01/14/2022   Type 1 diabetes mellitus with hyperglycemia ([place]) 01/14/2022   Vasomotor rhinitis 01/14/2022   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/2022   Adult attention deficit hyperactivity disorder 01/14/2022   Allergy to shellfish 01/14/2022   Dysphagia, oropharyngeal phase 01/14/2022   Gastro-esophageal reflux disease without esophagitis 01/14/2022   Low back pain 10/29/2021   Iron deficiency anemia secondary to inadequate dietary iron intake 02/02/2021   Frequent PVCs 10/28/2020   Hypertension    Nonallopathic lesion of thoracic region 04/28/2020   Chronic prostatitis without hematuria 05/08/2019   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 ([place]) 03/20/2018   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 edema associated with type 2 diabetes mellitus () 08/31/2017   Mild cataract of right eye 08/31/2017   HNP (herniated nucleus pulposus), cervical 03/16/2017   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 ([place]) 01/06/2017   Herniation of cervical intervertebral disc with radiculopathy 01/04/2017   Current mild episode of major depressive disorder without prior episode ([place]) 12/02/2016   Diabetic neuropathy ([place]) 03/01/2016   Obesity, Class II, BMI 35.0-39.9, with comorbidity (see actual BMI) 04/24/2015   Type 1 diabetes mellitus with hyperglycemia, with long-term current use of insulin ([place]) 04/04/2015   Patellofemoral stress syndrome of left knee 03/19/2015   Allergic rhinitis due to pollen 11/14/2014   Equinus deformity of foot, acquired 05/29/2014   ADHD (attention deficit hyperactivity disorder), inattentive type 10/24/2013   Vitamin D deficiency 02/28/2013   Pulmonic stenosis 05/25/2012   Pure hyperglyceridemia 05/25/2012   Hyperlipidemia with target LDL less than 100 09/20/2011   OSA (obstructive sleep apnea) 09/20/2011   BPH (benign prostatic hyperplasia) 09/20/2011   Routine general medical examination at a health care facility 09/20/2011    PCP: Scarlette Calico, MD  REFERRING PROVIDER: [name]  REFERRING DIAG: Low back pain   THERAPY DIAG:  Other low back pain  Rationale for Evaluation and Treatment: Rehabilitation  ONSET DATE:   SUBJECTIVE:   SUBJECTIVE STATEMENT:  No new complaints. Has been doing HEP.   Eval: End of August, pt bent  over to reach a piece of paper on floor, had significant back pain, L side. Has had only very mild previous back pain.  Now: significantly better, just having mild Low back soreness bilaterally He states he is still not able, and is fearful for forward bending.  Pt works very long hours.  Joined gym: [employer] , has not been  much due to hurting his back.  Neck: doing well . L shoulder: states limited for elevation   PERTINENT HISTORY: HTN, DM   PAIN:  Are you having pain? Yes: NPRS scale: 2/10 Pain location: bil low back Pain description: sore, tight Aggravating factors: increased activity/end of the day  Relieving factors: none stated     PRECAUTIONS: None  WEIGHT BEARING RESTRICTIONS: No  FALLS:  Has patient fallen in last 6 months? No   PLOF: Independent  PATIENT GOALS: Improve mobility, pain   OBJECTIVE:   DIAGNOSTIC FINDINGS:    COGNITION: Overall cognitive status: Within functional limits for tasks assessed     POSTURE: stiff back and upper body posture, mild fwd head and rounded shoulders    PALPATION: Tightness in bil HS R(significant) >L.   Hypomobile R hip flex and IR  Soreness in central T and L spine with PA s.   LOWER EXTREMITY ROM:  Lumbar: flexion: significant limitation: 70 deg,        Extension: mod/significant limitation      SB: mod limitation bil  Active ROM Right eval Left eval  Hip flexion Mild/mod limitation WFL  Hip extension    Hip abduction    Hip adduction    Hip internal rotation Significant limitation Mild limitation  Hip external rotation [facility]  Knee flexion [facility] WFL  Knee extension [facility]  Ankle dorsiflexion    Ankle plantarflexion    Ankle inversion    Ankle eversion     (Blank rows = not tested)  LOWER EXTREMITY MMT:  MMT Right eval Left eval  Hip flexion 4 4  Hip extension    Hip abduction 4 4  Hip adduction    Hip internal rotation    Hip external rotation    Knee flexion 5 5  Knee extension 5 5  Ankle dorsiflexion    Ankle plantarflexion    Ankle inversion    Ankle eversion     (Blank rows = not tested)  LOWER EXTREMITY SPECIAL TESTS:  Significant tightness R>L Hamstrings,  Neg SLR,   Hypomobile R hip flex and IR,    TODAY'S TREATMENT:                                                                                                                                 04/19/22: Therapeutic Exercise: Aerobic: Recumbent bike L 3 x 5 min (pinching in hips)  Supine: Bridging x 15;    hip ER butterfly to neutral position x 20, 3 sec holds;  TA contraction x 10;  Seated:  hip add/IR squeeze x 10;  Education and practice for optimal/upright seated posture x 5 min - (feet resting on the ground and working towards upright/neutral spine posture with more anterior pelvic tilt)  Standing:  Rows GTB x 20;  Paloff press double RTB x 20 bil;  Stretches: Seated HSS 30 sec x 3 bil;     Neuromuscular Re-education: Manual Therapy: Hip PROM ,      04/12/22: Therapeutic Exercise: Aerobic: Supine: Bridging x 15;   Hip add towel squeeze for IR x 20;  Seated: Sit to stand x 10 with cueing to decrease hip ER ; hip add/IR squeeze x 10;  Standing:  Rows GTB x 20;    Stretches: Seated HSS 30 sec x 3 bil;  seated lumbar and hip flexion foot on step, 30 sec x 5 on R;   LTR x 15;   ; Hip IR/ER fallout/ins x 5 min, with education on HEP;   gastroc stretch at wall 30 sec x 3;  Neuromuscular Re-education: Manual Therapy: Hip PROM ,  flex and IR stretching bil;  long leg distraction for hips bil;     04/05/22: Therapeutic Exercise: Aerobic: Supine: Bridging x 15;    Seated: Sit to stand x 10 ; Standing:  Rows GTB x 20;   Squats x 15;  Stretches: Seated HSS 30 sec x 3 bil;  seated lumbar fwd flexion 30 sec x 3;  LTR x 10;   ; Hip IR/ER fallout/ins x 5 min, with education on HEP; ITB 30 sec x 3 bil for hip IR;  Neuromuscular Re-education: Manual Therapy: Hip PROM , flex and IR stretching bil;     PATIENT EDUCATION:  Education details: reviewed HEP Person educated: Patient Education method: Explanation, Demonstration, Tactile cues, Verbal cues, and Handouts Education comprehension: verbalized understanding, returned demonstration, verbal cues required, tactile cues required, and needs further education   HOME  EXERCISE PROGRAM:  Access Code: [email or web address]   ASSESSMENT:   CLINICAL IMPRESSION: 04/19/2022 Trial for recumbent bike today, pt with some pinching in anterior hips, tolerance at 5 min today. Pt with inability for neutral spine posture in sitting, in regular chair, due to stiffness in hips. He is unable to sit at 90/90, with feet on ground. Education on practice for this today, as well as for HEP. Will benefit from continued work on this, as well as progressing core strength.   Eval: Patient presents with primary complaint of increased pain in low back. He did have significant pain in previous months that has improved. He has significant movement limitations, with limited and fearful flexion and bending. He has decreased core strength, and lack of effective HEP for his diagnosis. Pt to benefit from skilled PT to improve deficits and pain.   OBJECTIVE IMPAIRMENTS: decreased activity tolerance, decreased mobility, decreased ROM, decreased strength, hypomobility, increased muscle spasms, impaired flexibility, improper body mechanics, and pain.   ACTIVITY LIMITATIONS: lifting, bending, standing, squatting, sleeping, stairs, and locomotion level  PARTICIPATION LIMITATIONS: laundry, shopping, community activity, and yard work  PERSONAL FACTORS: none are also affecting patient's functional outcome.   REHAB POTENTIAL: Good  CLINICAL DECISION MAKING: Stable/uncomplicated  EVALUATION COMPLEXITY: Low   GOALS: Goals reviewed with patient? Yes  SHORT TERM GOALS: Target date: 03/31/2022   Pt to be independent with initial HEP  Goal status: MET    LONG TERM GOALS: Target date: 05/12/2022   Pt to be independent with final HEP  Goal status: INITIAL  2.  Pt to demo improved ROM for  lumbar flexion to be [facility] for pt age.   Goal status: INITIAL  3.  Pt to demo ability for bend/lift/squat with optimal mechanics, to improve ability for IADLS and work duties   Goal status: INITIAL  4.  Pt to  report decreased pain in back to 0-2/10 with activity and exercise.  Goal status: INITIAL    PLAN:  PT FREQUENCY: 1-2x/week  PT DURATION: 8 weeks  PLANNED INTERVENTIONS: Therapeutic exercises, Therapeutic activity, Neuromuscular re-education, Gait training, Patient/Family education, Self Care, Joint mobilization, Joint manipulation, Stair training, Orthotic/Fit training, DME instructions, Dry Needling, Electrical stimulation, Spinal manipulation, Spinal mobilization, Cryotherapy, Moist heat, Taping, Vasopneumatic device, Traction, Ultrasound, Ionotophoresis 40m/ml Dexamethasone, and Manual therapy  PLAN FOR NEXT SESSION:  hip IR, flexion,  TA, core strength, seated/upright posture, gym transition.   LLyndee Hensen PT, DPT 11:20 AM  04/19/22

## 2022-04-20 ENCOUNTER — Other Ambulatory Visit: Payer: Self-pay

## 2022-04-20 ENCOUNTER — Other Ambulatory Visit: Payer: Self-pay | Admitting: Internal Medicine

## 2022-04-20 ENCOUNTER — Other Ambulatory Visit (HOSPITAL_COMMUNITY): Payer: Self-pay

## 2022-04-20 DIAGNOSIS — G8929 Other chronic pain: Secondary | ICD-10-CM

## 2022-04-20 MED ORDER — VITAMIN D (ERGOCALCIFEROL) 1.25 MG (50000 UNIT) PO CAPS
ORAL_CAPSULE | ORAL | 0 refills | Status: DC
  Filled 2022-04-20: qty 12, fill #0
  Filled 2022-05-24 – 2022-06-17 (×2): qty 12, 84d supply, fill #0

## 2022-04-20 MED ORDER — MELOXICAM 15 MG PO TABS
15.0000 mg | ORAL_TABLET | Freq: Every day | ORAL | 0 refills | Status: DC
  Filled 2022-04-20: qty 30, 30d supply, fill #0

## 2022-04-20 NOTE — Progress Notes (Unsigned)
[name] [place] Phone: [phone] Subjective:   Zachary Graham, am serving as a scribe for Dr. Hulan Saas.  I'm seeing this patient by the request  of:  [name], MD  CC: Low back pain with follow-up  FAO:[id number]  Zachary Graham is a 61 y.o. male coming in with complaint of back and neck pain. OMT 03/04/2022. Patient states having trouble with his right arm where bending arm behind him he will get a pinch/twinge in the top of bicep. 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 States back has been doing fine with the OMT  Medications patient has been prescribed: Vit D, Hydroxizine  Taking: Vit D Hydroxyzine          Reviewed prior external information including notes and imaging from previsou exam, outside providers and external EMR if available.   As well as notes that were available from care everywhere and other healthcare systems.  Past medical history, social, surgical and family history all reviewed in electronic medical record.  No pertanent information unless stated regarding to the chief complaint.   Past Medical History:  Diagnosis Date   Diabetes mellitus    GERD (gastroesophageal reflux disease)    Heart murmur    Hyperlipidemia    Hypertension    OSA (obstructive sleep apnea)    Pneumonia     Allergies  Allergen Reactions   Lisinopril Swelling and Other (See Comments)    Extreme facial swelling causing hospitalization   Shellfish Allergy Anaphylaxis   Zetia [Ezetimibe] Other (See Comments)    ABDOMINAL CRAMPING   Dulaglutide Other (See Comments)   Gabapentin     Other reaction(s): Dizziness   Gabapentin (Once-Daily) Other (See Comments)   Iodine    Penicillins Other (See Comments)    UNSPECIFIED REACTION > Childhood allergy     Cymbalta [Duloxetine Hcl] Other (See Comments)    Severe feet cramps     Review of Systems:  No headache,  visual changes, nausea, vomiting, diarrhea, constipation, dizziness, abdominal pain, skin rash, fevers, chills, night sweats, weight loss, swollen lymph nodes, body aches, joint swelling, chest pain, shortness of breath, mood changes. POSITIVE muscle aches  Objective  Blood pressure 132/80, pulse 74, height '5\' 10"'$  (1.778 m), weight 237 lb (107.5 kg), SpO2 98 %.   General: No apparent distress alert and oriented x3 mood and affect normal, dressed appropriately.  HEENT: Pupils equal, extraocular movements intact  Respiratory: Patient's speak in full sentences and does not appear short of breath  Cardiovascular: No lower extremity edema, non tender, no erythema  Low back exam does have some loss of lordosis.  Patient does have external rotation of the hips bilaterally.  Does have some weakness and atrophy noted of the gluteal areas bilaterally.  Patient lacks last 10 degrees of extension.  Osteopathic findings T6 extended rotated and side bent right inhaled rib T9 extended rotated and side bent left L3 flexed rotated and side bent right Sacrum right on right       Assessment and Plan:  Low back pain Low back pain follow-up.  Patient does have an exacerbation after lifting things at work.  Patient had more of a muscle spasm.  Attempted osteopathic manipulation and I do think patient responds relatively well.  Due to patient's history of type 1 diabetes will hold on daily prednisone but could do this if any worsening pain again.  Discussed medications including the meloxicam 15 mg, Flexeril 5 mg up to 3 times a day as needed as well.  Patient knows that if he needs this he can see [place] sooner but otherwise follow-up in 4 to 6 weeks.    Nonallopathic problems  Decision today to treat with OMT was based on Physical Exam  After verbal consent patient was treated with HVLA, ME, FPR techniques in cervical, rib, thoracic, lumbar, and sacral  areas  Patient tolerated the procedure well with  improvement in symptoms  Patient given exercises, stretches and lifestyle modifications  See medications in patient instructions if given  Patient will follow up in 4-8 weeks    The above documentation has been reviewed and is accurate and complete [name], DO          Note: This dictation was prepared with Dragon dictation along with smaller phrase technology. Any transcriptional errors that result from this process are unintentional.

## 2022-04-22 ENCOUNTER — Ambulatory Visit: Payer: Self-pay

## 2022-04-22 ENCOUNTER — Ambulatory Visit (INDEPENDENT_AMBULATORY_CARE_PROVIDER_SITE_OTHER): Payer: 59 | Admitting: Family Medicine

## 2022-04-22 VITALS — BP 132/80 | HR 74 | Ht 70.0 in | Wt 237.0 lb

## 2022-04-22 DIAGNOSIS — M545 Low back pain, unspecified: Secondary | ICD-10-CM

## 2022-04-22 DIAGNOSIS — M25511 Pain in right shoulder: Secondary | ICD-10-CM

## 2022-04-22 DIAGNOSIS — M9903 Segmental and somatic dysfunction of lumbar region: Secondary | ICD-10-CM | POA: Diagnosis not present

## 2022-04-22 DIAGNOSIS — M9904 Segmental and somatic dysfunction of sacral region: Secondary | ICD-10-CM

## 2022-04-22 DIAGNOSIS — M9902 Segmental and somatic dysfunction of thoracic region: Secondary | ICD-10-CM | POA: Diagnosis not present

## 2022-04-22 DIAGNOSIS — M9908 Segmental and somatic dysfunction of rib cage: Secondary | ICD-10-CM

## 2022-04-22 DIAGNOSIS — G8929 Other chronic pain: Secondary | ICD-10-CM | POA: Diagnosis not present

## 2022-04-22 NOTE — Patient Instructions (Addendum)
Good to see you  Exercises given for shoulder Voltaren topically twice a day  Ice 20 minutes 2 times daily. Usually after activity and before bed. [place] Follow up in 6-8 weeks

## 2022-04-22 NOTE — Assessment & Plan Note (Signed)
Low back pain follow-up.  Patient does have an exacerbation after lifting things at work.  Patient had more of a muscle spasm.  Attempted osteopathic manipulation and I do think patient responds relatively well.  Due to patient's history of type 1 diabetes will hold on daily prednisone but could do this if any worsening pain again.  Discussed medications including the meloxicam 15 mg, Flexeril 5 mg up to 3 times a day as needed as well.  Patient knows that if he needs this he can see [place] sooner but otherwise follow-up in 4 to 6 weeks.

## 2022-04-26 ENCOUNTER — Other Ambulatory Visit (HOSPITAL_COMMUNITY): Payer: Self-pay

## 2022-04-29 ENCOUNTER — Encounter: Payer: 59 | Admitting: Physical Therapy

## 2022-05-05 ENCOUNTER — Other Ambulatory Visit: Payer: Self-pay | Admitting: Internal Medicine

## 2022-05-05 ENCOUNTER — Other Ambulatory Visit (HOSPITAL_COMMUNITY): Payer: Self-pay

## 2022-05-05 DIAGNOSIS — F900 Attention-deficit hyperactivity disorder, predominantly inattentive type: Secondary | ICD-10-CM

## 2022-05-05 DIAGNOSIS — F320 Major depressive disorder, single episode, mild: Secondary | ICD-10-CM

## 2022-05-05 MED ORDER — TRAZODONE HCL 50 MG PO TABS
ORAL_TABLET | Freq: Every day | ORAL | 1 refills | Status: DC
  Filled 2022-05-05: qty 90, 90d supply, fill #0

## 2022-05-05 MED ORDER — AMPHETAMINE-DEXTROAMPHET ER 20 MG PO CP24
20.0000 mg | ORAL_CAPSULE | Freq: Every morning | ORAL | 0 refills | Status: DC
  Filled 2022-05-05: qty 30, 30d supply, fill #0

## 2022-05-06 ENCOUNTER — Ambulatory Visit (INDEPENDENT_AMBULATORY_CARE_PROVIDER_SITE_OTHER): Payer: 59 | Admitting: Physical Therapy

## 2022-05-06 ENCOUNTER — Other Ambulatory Visit (HOSPITAL_COMMUNITY): Payer: Self-pay

## 2022-05-06 DIAGNOSIS — M5459 Other low back pain: Secondary | ICD-10-CM

## 2022-05-06 NOTE — Therapy (Signed)
OUTPATIENT PHYSICAL THERAPY LOWER EXTREMITY TREATMENT   Patient Name: Zachary Graham MRN: [id number] DOB:03/19/1961, 61 y.o., male Today's Date: 05/06/2022   PT End of Session - 05/11/22 1046     Visit Number 7    Number of Visits 16    Date for PT Re-Evaluation 05/13/22    Authorization Type [name]- [employer]    PT Start Time 0803    PT Stop Time [phone]    PT Time Calculation (min) 43 min    Activity Tolerance Patient tolerated treatment well    Behavior During Therapy WFL for tasks assessed/performed                Past Medical History:  Diagnosis Date   Diabetes mellitus    GERD (gastroesophageal reflux disease)    Heart murmur    Hyperlipidemia    Hypertension    OSA (obstructive sleep apnea)    Pneumonia    Past Surgical History:  Procedure Laterality Date   CERVICAL DISC ARTHROPLASTY N/A 03/16/2017   Procedure: CERVICAL FIVE- CERVICAL SIX [place] ARTHROPLASTY;  Surgeon: Jovita Gamma, MD;  Location: [place];  Service: Neurosurgery;  Laterality: N/A;  CERVICAL 5- CERVICAL 6 DISC ARTHROPLASTY   COLONOSCOPY     TONSILLECTOMY     Patient Active Problem List   Diagnosis Date Noted   Preglaucoma, unspecified, bilateral 01/14/2022   Seborrheic keratosis 01/14/2022   Type 1 diabetes mellitus with hyperglycemia ([place]) 01/14/2022   Vasomotor rhinitis 01/14/2022   Adult attention deficit hyperactivity disorder 01/14/2022   Allergy to shellfish 01/14/2022   Dysphagia, oropharyngeal phase 01/14/2022   Gastro-esophageal reflux disease without esophagitis 01/14/2022   Low back pain 10/29/2021   Iron deficiency anemia secondary to inadequate dietary iron intake 02/02/2021   Frequent PVCs 10/28/2020   Hypertension    Chronic prostatitis without hematuria 05/08/2019   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 ([place]) 03/20/2018   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 diabetes  mellitus ([place]) 08/31/2017   Mild cataract of right eye 08/31/2017   HNP (herniated nucleus pulposus), cervical 03/16/2017   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 ([place]) 01/06/2017   Herniation of cervical intervertebral disc with radiculopathy 01/04/2017   Current mild episode of major depressive disorder without prior episode ([place]) 12/02/2016   Diabetic neuropathy ([place]) 03/01/2016   Obesity, Class II, BMI 35.0-39.9, with comorbidity (see actual BMI) 04/24/2015   Type 1 diabetes mellitus with hyperglycemia, with long-term current use of insulin ([place]) 04/04/2015   Patellofemoral stress syndrome of left knee 03/19/2015   Allergic rhinitis due to pollen 11/14/2014   Equinus deformity of foot, acquired 05/29/2014   ADHD (attention deficit hyperactivity disorder), inattentive type 10/24/2013   Vitamin D deficiency 02/28/2013   Pulmonic stenosis 05/25/2012   Pure hyperglyceridemia 05/25/2012   Hyperlipidemia with target LDL less than 100 09/20/2011   OSA (obstructive sleep apnea) 09/20/2011   BPH (benign prostatic hyperplasia) 09/20/2011   Routine general medical examination at a health care facility 09/20/2011    PCP: Scarlette Calico, MD  REFERRING PROVIDER: [name]  REFERRING DIAG: Low back pain   THERAPY DIAG:  Other low back pain  Rationale for Evaluation and Treatment: Rehabilitation  ONSET DATE:   SUBJECTIVE:   SUBJECTIVE STATEMENT:  No new complaints. Has been doing HEP. Still feels hesitant to bend over to pick something up, feels he needs to hold on to something while he bends.  Eval: End of August, pt bent over to reach a piece of paper on floor, had significant back pain, L side. Has had only very mild previous back pain.  Now: significantly better, just having mild Low back soreness bilaterally He states he is still not able, and is fearful for forward bending.  Pt works very long hours.  Joined gym: [employer] , has not  been much due to hurting his back.  Neck: doing well . L shoulder: states limited for elevation   PERTINENT HISTORY: HTN, DM   PAIN:  Are you having pain? Yes: NPRS scale: 2/10 Pain location: bil low back Pain description: sore, tight Aggravating factors: increased activity/end of the day  Relieving factors: none stated     PRECAUTIONS: None  WEIGHT BEARING RESTRICTIONS: No  FALLS:  Has patient fallen in last 6 months? No   PLOF: Independent  PATIENT GOALS: Improve mobility, pain   OBJECTIVE:   DIAGNOSTIC FINDINGS:    COGNITION: Overall cognitive status: Within functional limits for tasks assessed     POSTURE: stiff back and upper body posture, mild fwd head and rounded shoulders    PALPATION: Tightness in bil HS R(significant) >L.   Hypomobile R hip flex and IR  Soreness in central T and L spine with PA s.   LOWER EXTREMITY ROM:  Lumbar: flexion: significant limitation: 70 deg,        Extension: mod/significant limitation      SB: mod limitation bil  Active ROM Right eval Left eval  Hip flexion Mild/mod limitation WFL  Hip extension    Hip abduction    Hip adduction    Hip internal rotation Significant limitation Mild limitation  Hip external rotation [facility]  Knee flexion [facility] WFL  Knee extension [facility]  Ankle dorsiflexion    Ankle plantarflexion    Ankle inversion    Ankle eversion     (Blank rows = not tested)  LOWER EXTREMITY MMT:  MMT Right eval Left eval  Hip flexion 4 4  Hip extension    Hip abduction 4 4  Hip adduction    Hip internal rotation    Hip external rotation    Knee flexion 5 5  Knee extension 5 5  Ankle dorsiflexion    Ankle plantarflexion    Ankle inversion    Ankle eversion     (Blank rows = not tested)  LOWER EXTREMITY SPECIAL TESTS:  Significant tightness R>L Hamstrings,  Neg SLR,   Hypomobile R hip flex and IR,    TODAY'S TREATMENT:                                                                                                                                 05/06/22: Therapeutic Exercise: Aerobic:  Supine: Bridging x 15;   hip ER butterfly to neutral position x 20, 3 sec holds;  TA contraction x 10;  Seated:   hip add/IR squeeze x  10;  Education and practice for optimal/upright seated posture x 5 min - (feet resting on the ground and working towards upright/neutral spine posture with more anterior pelvic tilt) , seated hip hinge fwd x 10;  Standing:  Rows GTB x 20;  Paloff press double RTB x 20 bil; Education and practice for fwd bend, with and without hip hinge motion  x 5 each.  Stretches: Seated HSS 30 sec x 3 bil;     Neuromuscular Re-education: Manual Therapy: Hip PROM ,   04/19/22: Therapeutic Exercise: Aerobic: Recumbent bike L 3 x 5 min (pinching in hips)  Supine: Bridging x 15;    hip ER butterfly to neutral position x 20, 3 sec holds;  TA contraction x 10;  Seated:   hip add/IR squeeze x 10;  Education and practice for optimal/upright seated posture x 5 min - (feet resting on the ground and working towards upright/neutral spine posture with more anterior pelvic tilt)  Standing:  Rows GTB x 20;  Paloff press double RTB x 20 bil;  Stretches: Seated HSS 30 sec x 3 bil;     Neuromuscular Re-education: Manual Therapy: Hip PROM ,      04/12/22: Therapeutic Exercise: Aerobic: Supine: Bridging x 15;   Hip add towel squeeze for IR x 20;  Seated: Sit to stand x 10 with cueing to decrease hip ER ; hip add/IR squeeze x 10;  Standing:  Rows GTB x 20;    Stretches: Seated HSS 30 sec x 3 bil;  seated lumbar and hip flexion foot on step, 30 sec x 5 on R;   LTR x 15;   ; Hip IR/ER fallout/ins x 5 min, with education on HEP;   gastroc stretch at wall 30 sec x 3;  Neuromuscular Re-education: Manual Therapy: Hip PROM ,  flex and IR stretching bil;  long leg distraction for hips bil;     PATIENT EDUCATION:  Education details: reviewed HEP Person educated: Patient Education method:  Explanation, Demonstration, Tactile cues, Verbal cues, and Handouts Education comprehension: verbalized understanding, returned demonstration, verbal cues required, tactile cues required, and needs further education   HOME EXERCISE PROGRAM:  Access Code: [email or web address]   ASSESSMENT:   CLINICAL IMPRESSION: 05/06/2022 Pt hesitant for lumbar flexion to pick up object from floor, due to previous back pain. Back pain resolved at this time, but pt has remaining hesitancy for bending and functional activity. He has significant stiffness in hips, that limits his ability to sit comfortably in a neutral position in chair. Will benefit from continued education and practice for lumbar and hip mobility, as well as bend, squat, and ability to obtain neutral seated positions.   Eval: Patient presents with primary complaint of increased pain in low back. He did have significant pain in previous months that has improved. He has significant movement limitations, with limited and fearful flexion and bending. He has decreased core strength, and lack of effective HEP for his diagnosis. Pt to benefit from skilled PT to improve deficits and pain.   OBJECTIVE IMPAIRMENTS: decreased activity tolerance, decreased mobility, decreased ROM, decreased strength, hypomobility, increased muscle spasms, impaired flexibility, improper body mechanics, and pain.   ACTIVITY LIMITATIONS: lifting, bending, standing, squatting, sleeping, stairs, and locomotion level  PARTICIPATION LIMITATIONS: laundry, shopping, community activity, and yard work  PERSONAL FACTORS: none are also affecting patient's functional outcome.   REHAB POTENTIAL: Good  CLINICAL DECISION MAKING: Stable/uncomplicated  EVALUATION COMPLEXITY: Low   GOALS: Goals reviewed with patient? Yes  SHORT TERM GOALS:  Target date: 03/31/2022   Pt to be independent with initial HEP  Goal status: MET    LONG TERM GOALS: Target date: 05/12/2022   Pt to be  independent with final HEP  Goal status: INITIAL  2.  Pt to demo improved ROM for lumbar flexion to be [facility] for pt age.   Goal status: INITIAL  3.  Pt to demo ability for bend/lift/squat with optimal mechanics, to improve ability for IADLS and work duties   Goal status: INITIAL  4.  Pt to report decreased pain in back to 0-2/10 with activity and exercise.  Goal status: INITIAL    PLAN:  PT FREQUENCY: 1-2x/week  PT DURATION: 8 weeks  PLANNED INTERVENTIONS: Therapeutic exercises, Therapeutic activity, Neuromuscular re-education, Gait training, Patient/Family education, Self Care, Joint mobilization, Joint manipulation, Stair training, Orthotic/Fit training, DME instructions, Dry Needling, Electrical stimulation, Spinal manipulation, Spinal mobilization, Cryotherapy, Moist heat, Taping, Vasopneumatic device, Traction, Ultrasound, Ionotophoresis 47m/ml Dexamethasone, and Manual therapy  PLAN FOR NEXT SESSION:  hip IR, flexion,  TA, core strength, seated/upright posture, gym transition.   LLyndee Hensen PT, DPT 10:54 AM  05/11/22

## 2022-05-07 ENCOUNTER — Other Ambulatory Visit (HOSPITAL_COMMUNITY): Payer: Self-pay

## 2022-05-07 ENCOUNTER — Encounter: Payer: Self-pay | Admitting: Internal Medicine

## 2022-05-07 ENCOUNTER — Other Ambulatory Visit: Payer: Self-pay | Admitting: Internal Medicine

## 2022-05-07 MED ORDER — EPINEPHRINE 0.3 MG/0.3ML IJ SOAJ
0.3000 mg | INTRAMUSCULAR | 99 refills | Status: AC | PRN
  Filled 2022-05-07: qty 2, 30d supply, fill #0

## 2022-05-07 MED ORDER — INSULIN LISPRO 100 UNIT/ML IJ SOLN
INTRAMUSCULAR | 3 refills | Status: DC
  Filled 2022-05-07: qty 40, 28d supply, fill #0
  Filled 2022-07-03: qty 40, 28d supply, fill #1
  Filled 2022-09-07: qty 40, 28d supply, fill #2
  Filled 2022-12-15: qty 40, 28d supply, fill #3
  Filled 2023-01-06: qty 40, 28d supply, fill #4

## 2022-05-07 MED ORDER — GLUCAGON 3 MG/DOSE NA POWD
3.0000 mg | Freq: Once | NASAL | 11 refills | Status: DC | PRN
  Filled 2022-05-07: qty 1, 1d supply, fill #0

## 2022-05-07 MED ORDER — GVOKE HYPOPEN 1-PACK 1 MG/0.2ML ~~LOC~~ SOAJ
SUBCUTANEOUS | 99 refills | Status: AC
  Filled 2022-05-07: qty 0.2, 28d supply, fill #0
  Filled 2022-07-03: qty 0.2, 28d supply, fill #1

## 2022-05-11 ENCOUNTER — Encounter: Payer: Self-pay | Admitting: Internal Medicine

## 2022-05-11 ENCOUNTER — Other Ambulatory Visit (HOSPITAL_COMMUNITY): Payer: Self-pay

## 2022-05-11 ENCOUNTER — Encounter: Payer: Self-pay | Admitting: Physical Therapy

## 2022-05-12 ENCOUNTER — Other Ambulatory Visit (HOSPITAL_COMMUNITY): Payer: Self-pay

## 2022-05-14 ENCOUNTER — Encounter: Payer: Self-pay | Admitting: Physical Therapy

## 2022-05-14 ENCOUNTER — Ambulatory Visit (INDEPENDENT_AMBULATORY_CARE_PROVIDER_SITE_OTHER): Payer: 59 | Admitting: Physical Therapy

## 2022-05-14 DIAGNOSIS — M5459 Other low back pain: Secondary | ICD-10-CM | POA: Diagnosis not present

## 2022-05-14 NOTE — Therapy (Signed)
OUTPATIENT PHYSICAL THERAPY LOWER EXTREMITY TREATMENT/Re-Cert    Patient Name: Zachary Graham MRN: [id number] DOB:1961/04/20, 61 y.o., male Today's Date: 05/14/2022   PT End of Session - 05/14/22 0814     Visit Number 8    Number of Visits 16    Date for PT Re-Evaluation 06/25/22    Authorization Type [employer]- [employer]    PT Start Time 0805    PT Stop Time 0845    PT Time Calculation (min) 40 min    Activity Tolerance Patient tolerated treatment well    Behavior During Therapy WFL for tasks assessed/performed                Past Medical History:  Diagnosis Date   Diabetes mellitus    GERD (gastroesophageal reflux disease)    Heart murmur    Hyperlipidemia    Hypertension    OSA (obstructive sleep apnea)    Pneumonia    Past Surgical History:  Procedure Laterality Date   CERVICAL DISC ARTHROPLASTY N/A 03/16/2017   Procedure: CERVICAL FIVE- CERVICAL SIX [place] ARTHROPLASTY;  Surgeon: Jovita Gamma, MD;  Location: [place];  Service: Neurosurgery;  Laterality: N/A;  CERVICAL 5- CERVICAL 6 DISC ARTHROPLASTY   COLONOSCOPY     TONSILLECTOMY     Patient Active Problem List   Diagnosis Date Noted   Preglaucoma, unspecified, bilateral 01/14/2022   Seborrheic keratosis 01/14/2022   Type 1 diabetes mellitus with hyperglycemia ([place]) 01/14/2022   Vasomotor rhinitis 01/14/2022   Adult attention deficit hyperactivity disorder 01/14/2022   Allergy to shellfish 01/14/2022   Dysphagia, oropharyngeal phase 01/14/2022   Gastro-esophageal reflux disease without esophagitis 01/14/2022   Low back pain 10/29/2021   Iron deficiency anemia secondary to inadequate dietary iron intake 02/02/2021   Frequent PVCs 10/28/2020   Hypertension    Chronic prostatitis without hematuria 05/08/2019   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 ([place]) 03/20/2018   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  diabetes mellitus ([place]) 08/31/2017   Mild cataract of right eye 08/31/2017   HNP (herniated nucleus pulposus), cervical 03/16/2017   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 ([place]) 01/06/2017   Herniation of cervical intervertebral disc with radiculopathy 01/04/2017   Current mild episode of major depressive disorder without prior episode ([place]) 12/02/2016   Diabetic neuropathy ([place]) 03/01/2016   Obesity, Class II, BMI 35.0-39.9, with comorbidity (see actual BMI) 04/24/2015   Type 1 diabetes mellitus with hyperglycemia, with long-term current use of insulin ([place]) 04/04/2015   Patellofemoral stress syndrome of left knee 03/19/2015   Allergic rhinitis due to pollen 11/14/2014   Equinus deformity of foot, acquired 05/29/2014   ADHD (attention deficit hyperactivity disorder), inattentive type 10/24/2013   Vitamin D deficiency 02/28/2013   Pulmonic stenosis 05/25/2012   Pure hyperglyceridemia 05/25/2012   Hyperlipidemia with target LDL less than 100 09/20/2011   OSA (obstructive sleep apnea) 09/20/2011   BPH (benign prostatic hyperplasia) 09/20/2011   Routine general medical examination at a health care facility 09/20/2011    PCP: Scarlette Calico, MD  REFERRING PROVIDER: [name]  REFERRING DIAG: Low back pain   THERAPY DIAG:  Other low back pain  Rationale for Evaluation and Treatment: Rehabilitation  ONSET DATE:   SUBJECTIVE:   SUBJECTIVE STATEMENT:  No new complaints. Has been doing HEP.    Eval: End of August, pt bent over to reach a piece of paper on floor, had significant  back pain, L side. Has had only very mild previous back pain.  Now: significantly better, just having mild Low back soreness bilaterally He states he is still not able, and is fearful for forward bending.  Pt works very long hours.  Joined gym: [employer] , has not been much due to hurting his back.  Neck: doing well . L shoulder: states limited for  elevation   PERTINENT HISTORY: HTN, DM   PAIN:  Are you having pain? Yes: NPRS scale: 2/10 Pain location: bil low back Pain description: sore, tight Aggravating factors: increased activity/end of the day  Relieving factors: none stated     PRECAUTIONS: None  WEIGHT BEARING RESTRICTIONS: No  FALLS:  Has patient fallen in last 6 months? No   PLOF: Independent  PATIENT GOALS: Improve mobility, pain   OBJECTIVE: updated 12/29  DIAGNOSTIC FINDINGS:    COGNITION: Overall cognitive status: Within functional limits for tasks assessed     POSTURE: stiff back and upper body posture, mild fwd head and rounded shoulders    PALPATION: Tightness in bil HS R(significant) >L.   Hypomobile R hip flex and IR   LOWER EXTREMITY ROM:  Lumbar: flexion: mod limitation:        Extension: mod/significant limitation      SB: mod limitation bil  Active ROM Right 12/29 Left 12/29  Hip flexion Mild/mod limitation WFL  Hip extension    Hip abduction    Hip adduction    Hip internal rotation Significant limitation Mild limitation  Hip external rotation [facility]  Knee flexion [facility] WFL  Knee extension [facility]  Ankle dorsiflexion    Ankle plantarflexion    Ankle inversion    Ankle eversion     (Blank rows = not tested)  LOWER EXTREMITY MMT:  MMT Right 12/29 Left 12/29  Hip flexion 4+ 4+  Hip extension    Hip abduction 4 4  Hip adduction    Hip internal rotation    Hip external rotation    Knee flexion 5 5  Knee extension 5 5  Ankle dorsiflexion    Ankle plantarflexion    Ankle inversion    Ankle eversion     (Blank rows = not tested)   LOWER EXTREMITY SPECIAL TESTS:  Significant tightness R>L Hamstrings,  Neg SLR,   Hypomobile R hip flex and IR,    TODAY'S TREATMENT:                                                                                                                                05/14/22: Therapeutic Exercise: Aerobic:   Trial for bike L2 x 3  min ( unable- pinching/pain in hips)  Supine: Bridging x 15;   TA contraction x 10;  Seated:   hip add/IR squeeze x 10;  Education and practice for optimal/upright seated posture x 5 min - (feet resting on the ground and working towards upright/neutral spine posture with more  anterior pelvic tilt) , seated hip hinge fwd x 10;  Standing:   Stretches: Seated HSS 30 sec x 3 bil; ITB stretch with strap x 3 bil;  Shoulder flexion with wand x 15;    Neuromuscular Re-education: Manual Therapy: Hip PROM all motions. ,    05/06/22: Therapeutic Exercise: Aerobic:  Supine: Bridging x 15;   hip ER butterfly to neutral position x 20, 3 sec holds;  TA contraction x 10;  Seated:   hip add/IR squeeze x 10;  Education and practice for optimal/upright seated posture x 5 min - (feet resting on the ground and working towards upright/neutral spine posture with more anterior pelvic tilt) , seated hip hinge fwd x 10;  Standing:  Rows GTB x 20;  Paloff press double RTB x 20 bil; Education and practice for fwd bend, with and without hip hinge motion  x 5 each.  Stretches: Seated HSS 30 sec x 3 bil;     Neuromuscular Re-education: Manual Therapy: Hip PROM ,   04/19/22: Therapeutic Exercise: Aerobic: Recumbent bike L 3 x 5 min (pinching in hips)  Supine: Bridging x 15;    hip ER butterfly to neutral position x 20, 3 sec holds;  TA contraction x 10;  Seated:   hip add/IR squeeze x 10;  Education and practice for optimal/upright seated posture x 5 min - (feet resting on the ground and working towards upright/neutral spine posture with more anterior pelvic tilt)  Standing:  Rows GTB x 20;  Paloff press double RTB x 20 bil;  Stretches: Seated HSS 30 sec x 3 bil;     Neuromuscular Re-education: Manual Therapy: Hip PROM ,       PATIENT EDUCATION:  Education details: reviewed HEP Person educated: Patient Education method: Consulting civil engineer, Demonstration, Tactile cues, Verbal cues, and Handouts Education comprehension:  verbalized understanding, returned demonstration, verbal cues required, tactile cues required, and needs further education   HOME EXERCISE PROGRAM:  Access Code: [email or web address]   ASSESSMENT:   CLINICAL IMPRESSION: 05/14/2022 Plan to see pt for 1 more visit, will continue to review positioning for sitting, education on bend/lift, as well as review of HEP. Pt plans to transition to gym in the next month, to continue exercise. Will refer to PT for 1 visit to review safe equipment for this pts back and hip issues. Pt in agreement with plan.   Eval: Patient presents with primary complaint of increased pain in low back. He did have significant pain in previous months that has improved. He has significant movement limitations, with limited and fearful flexion and bending. He has decreased core strength, and lack of effective HEP for his diagnosis. Pt to benefit from skilled PT to improve deficits and pain.   OBJECTIVE IMPAIRMENTS: decreased activity tolerance, decreased mobility, decreased ROM, decreased strength, hypomobility, increased muscle spasms, impaired flexibility, improper body mechanics, and pain.   ACTIVITY LIMITATIONS: lifting, bending, standing, squatting, sleeping, stairs, and locomotion level  PARTICIPATION LIMITATIONS: laundry, shopping, community activity, and yard work  PERSONAL FACTORS: none are also affecting patient's functional outcome.   REHAB POTENTIAL: Good  CLINICAL DECISION MAKING: Stable/uncomplicated  EVALUATION COMPLEXITY: Low   GOALS: Goals reviewed with patient? Yes  SHORT TERM GOALS: Target date: 03/31/2022   Pt to be independent with initial HEP  Goal status: MET    LONG TERM GOALS: Target date: 06/25/2022    Pt to be independent with final HEP  Goal status: MET  2.  Pt to demo improved ROM for lumbar flexion to  be [facility] for pt age.   Goal status: IN PROGRESS  3.  Pt to demo ability for bend/lift/squat with optimal mechanics, to improve ability  for IADLS and work duties   Goal status: IN PROGRESS  4.  Pt to report decreased pain in back to 0-2/10 with activity and exercise.  Goal status: MET    PLAN:  PT FREQUENCY: 1-2x/week  PT DURATION: 6 weeks  PLANNED INTERVENTIONS: Therapeutic exercises, Therapeutic activity, Neuromuscular re-education, Gait training, Patient/Family education, Self Care, Joint mobilization, Joint manipulation, Stair training, Orthotic/Fit training, DME instructions, Dry Needling, Electrical stimulation, Spinal manipulation, Spinal mobilization, Cryotherapy, Moist heat, Taping, Vasopneumatic device, Traction, Ultrasound, Ionotophoresis 63m/ml Dexamethasone, and Manual therapy  PLAN FOR NEXT SESSION:   Gym equipment: Review:  Nu Step Elliptical Leg Press LE strength: Knee flex/ext   LLyndee Hensen PT, DPT 2:04 PM  05/14/22

## 2022-05-24 ENCOUNTER — Other Ambulatory Visit: Payer: Self-pay | Admitting: Internal Medicine

## 2022-05-24 ENCOUNTER — Other Ambulatory Visit (HOSPITAL_COMMUNITY): Payer: Self-pay

## 2022-05-24 DIAGNOSIS — F900 Attention-deficit hyperactivity disorder, predominantly inattentive type: Secondary | ICD-10-CM

## 2022-05-24 MED ORDER — AMPHETAMINE-DEXTROAMPHET ER 20 MG PO CP24
20.0000 mg | ORAL_CAPSULE | Freq: Every morning | ORAL | 0 refills | Status: DC
  Filled 2022-05-24 – 2022-06-03 (×3): qty 30, 30d supply, fill #0

## 2022-05-26 ENCOUNTER — Encounter: Payer: Commercial Managed Care - PPO | Admitting: Physical Therapy

## 2022-05-31 ENCOUNTER — Encounter: Payer: Self-pay | Admitting: Physical Therapy

## 2022-05-31 ENCOUNTER — Ambulatory Visit (INDEPENDENT_AMBULATORY_CARE_PROVIDER_SITE_OTHER): Payer: 59 | Admitting: Physical Therapy

## 2022-05-31 DIAGNOSIS — M5459 Other low back pain: Secondary | ICD-10-CM | POA: Diagnosis not present

## 2022-05-31 NOTE — Therapy (Signed)
OUTPATIENT PHYSICAL THERAPY LOWER EXTREMITY TREATMENT   Patient Name: Zachary Graham MRN: [id number] DOB:Jan 12, 1961, 62 y.o., male Today's Date: 05/31/2022   PT End of Session - 05/31/22 0804     Visit Number 9    Number of Visits 16    Date for PT Re-Evaluation 06/25/22    Authorization Type [employer]- [employer]    PT Start Time 0805    PT Stop Time 0848    PT Time Calculation (min) 43 min    Activity Tolerance Patient tolerated treatment well    Behavior During Therapy WFL for tasks assessed/performed                Past Medical History:  Diagnosis Date   Diabetes mellitus    GERD (gastroesophageal reflux disease)    Heart murmur    Hyperlipidemia    Hypertension    OSA (obstructive sleep apnea)    Pneumonia    Past Surgical History:  Procedure Laterality Date   CERVICAL DISC ARTHROPLASTY N/A 03/16/2017   Procedure: CERVICAL FIVE- CERVICAL SIX [place] ARTHROPLASTY;  Surgeon: Jovita Gamma, MD;  Location: [facility];  Service: Neurosurgery;  Laterality: N/A;  CERVICAL 5- CERVICAL 6 DISC ARTHROPLASTY   COLONOSCOPY     TONSILLECTOMY     Patient Active Problem List   Diagnosis Date Noted   Preglaucoma, unspecified, bilateral 01/14/2022   Seborrheic keratosis 01/14/2022   Type 1 diabetes mellitus with hyperglycemia ([place]) 01/14/2022   Vasomotor rhinitis 01/14/2022   Adult attention deficit hyperactivity disorder 01/14/2022   Allergy to shellfish 01/14/2022   Dysphagia, oropharyngeal phase 01/14/2022   Gastro-esophageal reflux disease without esophagitis 01/14/2022   Low back pain 10/29/2021   Iron deficiency anemia secondary to inadequate dietary iron intake 02/02/2021   Frequent PVCs 10/28/2020   Hypertension    Chronic prostatitis without hematuria 05/08/2019   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 ([place]) 03/20/2018   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 diabetes  mellitus ([place]) 08/31/2017   Mild cataract of right eye 08/31/2017   HNP (herniated nucleus pulposus), cervical 03/16/2017   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 ([place]) 01/06/2017   Herniation of cervical intervertebral disc with radiculopathy 01/04/2017   Current mild episode of major depressive disorder without prior episode ([place]) 12/02/2016   Diabetic neuropathy ([place]) 03/01/2016   Obesity, Class II, BMI 35.0-39.9, with comorbidity (see actual BMI) 04/24/2015   Type 1 diabetes mellitus with hyperglycemia, with long-term current use of insulin ([place]) 04/04/2015   Patellofemoral stress syndrome of left knee 03/19/2015   Allergic rhinitis due to pollen 11/14/2014   Equinus deformity of foot, acquired 05/29/2014   ADHD (attention deficit hyperactivity disorder), inattentive type 10/24/2013   Vitamin D deficiency 02/28/2013   Pulmonic stenosis 05/25/2012   Pure hyperglyceridemia 05/25/2012   Hyperlipidemia with target LDL less than 100 09/20/2011   OSA (obstructive sleep apnea) 09/20/2011   BPH (benign prostatic hyperplasia) 09/20/2011   Routine general medical examination at a health care facility 09/20/2011    PCP: Scarlette Calico, MD  REFERRING PROVIDER: [name]  REFERRING DIAG: Low back pain   THERAPY DIAG:  Other low back pain  Rationale for Evaluation and Treatment: Rehabilitation  ONSET DATE:   SUBJECTIVE:   SUBJECTIVE STATEMENT:  No new complaints. Has been doing HEP. Had a "tweak" of the other side of his back when moving around in chair, but resolved within 1 day.  Eval: End of August, pt bent over to reach a piece of paper on floor, had significant back pain, L side. Has had only very mild previous back pain.  Now: significantly better, just having mild Low back soreness bilaterally He states he is still not able, and is fearful for forward bending.  Pt works very long hours.  Joined gym: [employer] , has not been much  due to hurting his back.  Neck: doing well . L shoulder: states limited for elevation   PERTINENT HISTORY: HTN, DM   PAIN:  Are you having pain? Yes: NPRS scale: 0/10 Pain location: bil low back Pain description: sore, tight Aggravating factors: increased activity/end of the day  Relieving factors: none stated     PRECAUTIONS: None  WEIGHT BEARING RESTRICTIONS: No  FALLS:  Has patient fallen in last 6 months? No   PLOF: Independent  PATIENT GOALS: Improve mobility, pain   OBJECTIVE: updated 12/29  DIAGNOSTIC FINDINGS:    COGNITION: Overall cognitive status: Within functional limits for tasks assessed     POSTURE: stiff back and upper body posture, mild fwd head and rounded shoulders    PALPATION: Tightness in bil HS R(significant) >L.   Hypomobile R hip flex and IR   LOWER EXTREMITY ROM:  Lumbar: flexion: mod limitation:        Extension: mod/significant limitation      SB: mod limitation bil  Active ROM Right 12/29 Left 12/29  Hip flexion Mild/mod limitation WFL  Hip extension    Hip abduction    Hip adduction    Hip internal rotation Significant limitation Mild limitation  Hip external rotation [facility]  Knee flexion [facility] WFL  Knee extension [facility]  Ankle dorsiflexion    Ankle plantarflexion    Ankle inversion    Ankle eversion     (Blank rows = not tested)  LOWER EXTREMITY MMT:  MMT Right 12/29 Left 12/29  Hip flexion 4+ 4+  Hip extension    Hip abduction 4 4  Hip adduction    Hip internal rotation    Hip external rotation    Knee flexion 5 5  Knee extension 5 5  Ankle dorsiflexion    Ankle plantarflexion    Ankle inversion    Ankle eversion     (Blank rows = not tested)   LOWER EXTREMITY SPECIAL TESTS:  Significant tightness R>L Hamstrings,  Neg SLR,   Hypomobile R hip flex and IR,    TODAY'S TREATMENT:                                              05/31/22: Therapeutic Exercise: Aerobic:    Supine: Bridging x 15;    TA contraction with breathing x 10; hip add iso with ball x 15;  Seated:    seated hip hinge fwd x 10;  Sit to stand x 5;   Standing:  Rows Blue TB x 20; Low Rows RTB x 20;  Squats at chair x 10 with education on mechanics;  Squat to pick up KB 15 lb x 10 with education on back mechanics;  Stretches: Seated HSS 30 sec x 3 bil; ITB stretch with strap x 3 bil;  Shoulder flexion with wand x 15;    Neuromuscular Re-education: Manual Therapy: Hip PROM all motions. Shoulder Flexion PROM;  05/14/22: Therapeutic Exercise: Aerobic:   Trial for bike L2 x 3 min ( unable- pinching/pain in hips)  Supine: Bridging x 15;   TA contraction x 10;  Seated:   hip add/IR squeeze x 10;  Education and practice for optimal/upright seated posture x 5 min - (feet resting on the ground and working towards upright/neutral spine posture with more anterior pelvic tilt) , seated hip hinge fwd x 10;  Standing:   Stretches: Seated HSS 30 sec x 3 bil; ITB stretch with strap x 3 bil;  Shoulder flexion with wand x 15;    Neuromuscular Re-education: Manual Therapy: Hip PROM all motions. ,    05/06/22: Therapeutic Exercise: Aerobic:  Supine: Bridging x 15;   hip ER butterfly to neutral position x 20, 3 sec holds;  TA contraction x 10;  Seated:   hip add/IR squeeze x 10;  Education and practice for optimal/upright seated posture x 5 min - (feet resting on the ground and working towards upright/neutral spine posture with more anterior pelvic tilt) , seated hip hinge fwd x 10;  Standing:  Rows GTB x 20;  Paloff press double RTB x 20 bil; Education and practice for fwd bend, with and without hip hinge motion  x 5 each.  Stretches: Seated HSS 30 sec x 3 bil;     Neuromuscular Re-education: Manual Therapy: Hip PROM ,   04/19/22: Therapeutic Exercise: Aerobic: Recumbent bike L 3 x 5 min (pinching in hips)  Supine: Bridging x 15;    hip ER  butterfly to neutral position x 20, 3 sec holds;  TA contraction x 10;  Seated:   hip add/IR squeeze x 10;  Education and practice for optimal/upright seated posture x 5 min - (feet resting on the ground and working towards upright/neutral spine posture with more anterior pelvic tilt)  Standing:  Rows GTB x 20;  Paloff press double RTB x 20 bil;  Stretches: Seated HSS 30 sec x 3 bil;     Neuromuscular Re-education: Manual Therapy: Hip PROM ,       PATIENT EDUCATION:  Education details: reviewed HEP Person educated: Patient Education method: Consulting civil engineer, Demonstration, Tactile cues, Verbal cues, and Handouts Education comprehension: verbalized understanding, returned demonstration, verbal cues required, tactile cues required, and needs further education   HOME EXERCISE PROGRAM:  Access Code: [email or web address]   ASSESSMENT:   CLINICAL IMPRESSION: 05/31/2022  Pt with improved understanding of seated posture and neutral alignment. He continues to have much difficulty with this due to stiffness and limited hip ROM, but is doing more during the day and with HEP to improve.  He has more difficulty with TA contraction today, and will benefit from further practice with this. He is scheduled to be seen in PT in gym setting to review safety with use of machines.    Eval: Patient presents with primary complaint of increased pain in low back. He did have significant pain in previous months that has improved. He has significant movement limitations, with limited and fearful flexion and bending. He has decreased core strength, and lack of effective HEP for his diagnosis. Pt to benefit from skilled PT to improve deficits and pain.   OBJECTIVE IMPAIRMENTS: decreased activity tolerance, decreased mobility, decreased ROM, decreased strength, hypomobility, increased muscle spasms, impaired flexibility, improper body mechanics, and pain.   ACTIVITY LIMITATIONS: lifting, bending, standing, squatting, sleeping,  stairs, and locomotion level  PARTICIPATION LIMITATIONS: laundry, shopping, community activity, and yard work  PERSONAL FACTORS: none are also affecting patient's functional  outcome.   REHAB POTENTIAL: Good  CLINICAL DECISION MAKING: Stable/uncomplicated  EVALUATION COMPLEXITY: Low   GOALS: Goals reviewed with patient? Yes  SHORT TERM GOALS: Target date: 03/31/2022   Pt to be independent with initial HEP  Goal status: MET    LONG TERM GOALS: Target date: 06/25/2022    Pt to be independent with final HEP  Goal status: MET  2.  Pt to demo improved ROM for lumbar flexion to be [facility] for pt age.   Goal status: IN PROGRESS  3.  Pt to demo ability for bend/lift/squat with optimal mechanics, to improve ability for IADLS and work duties   Goal status: IN PROGRESS  4.  Pt to report decreased pain in back to 0-2/10 with activity and exercise.  Goal status: MET    PLAN:  PT FREQUENCY: 1-2x/week  PT DURATION: 6 weeks  PLANNED INTERVENTIONS: Therapeutic exercises, Therapeutic activity, Neuromuscular re-education, Gait training, Patient/Family education, Self Care, Joint mobilization, Joint manipulation, Stair training, Orthotic/Fit training, DME instructions, Dry Needling, Electrical stimulation, Spinal manipulation, Spinal mobilization, Cryotherapy, Moist heat, Taping, Vasopneumatic device, Traction, Ultrasound, Ionotophoresis '4mg'$ /ml Dexamethasone, and Manual therapy   PLAN FOR NEXT SESSION:   Gym equipment: Review:  Nu Step and  Elliptical   (unable to do bike)  Leg Press  (foot/knee alignment)  Other LE strength: Knee Ext/ [place]  Row/scapular strength Review UE press/other  if there is time  No Overhead press (shoulder stiffness and neck fusion)  * Review TA in supine if there is time  *Pt will benefit from continued priority of work on mobility for back and hips with stretching, but would benefit from going to gym for strengthening if he feels safe to do so.    Lyndee Hensen, PT, DPT 12:36 PM  05/31/22

## 2022-06-03 ENCOUNTER — Other Ambulatory Visit (HOSPITAL_COMMUNITY): Payer: Self-pay

## 2022-06-08 NOTE — Progress Notes (Unsigned)
[facility] [place] [place] Phone: [phone] Subjective:    I'm seeing this patient by the request  of:  [name], MD  CC: Neck and back pain follow-up  [id number]  Zachary Graham is a 62 y.o. male coming in with complaint of back and neck pain. OMT 04/22/2022. Patient states doing well. Here for manipulation. No new issues. Refill prescriptions.  Medications patient has been prescribed: Hydroxizine  Taking:         Reviewed prior external information including notes and imaging from previsou exam, outside providers and external EMR if available.   As well as notes that were available from care everywhere and other healthcare systems.  Past medical history, social, surgical and family history all reviewed in electronic medical record.  No pertanent information unless stated regarding to the chief complaint.   Past Medical History:  Diagnosis Date   Diabetes mellitus    GERD (gastroesophageal reflux disease)    Heart murmur    Hyperlipidemia    Hypertension    OSA (obstructive sleep apnea)    Pneumonia     Allergies  Allergen Reactions   Lisinopril Swelling and Other (See Comments)    Extreme facial swelling causing hospitalization   Shellfish Allergy Anaphylaxis   Zetia [Ezetimibe] Other (See Comments)    ABDOMINAL CRAMPING   Dulaglutide Other (See Comments)   Gabapentin     Other reaction(s): Dizziness   Gabapentin (Once-Daily) Other (See Comments)   Iodine    Penicillins Other (See Comments)    UNSPECIFIED REACTION > Childhood allergy     Cymbalta [Duloxetine Hcl] Other (See Comments)    Severe feet cramps     Review of Systems:  No headache, visual changes, nausea, vomiting, diarrhea, constipation, dizziness, abdominal pain, skin rash, fevers, chills, night sweats, weight loss, swollen lymph nodes, body aches, joint swelling, chest pain, shortness of breath, mood changes. POSITIVE muscle  aches  Objective  Blood pressure 122/70, pulse 77, height '5\' 10"'$  (1.778 m), weight 239 lb (108.4 kg), SpO2 97 %.   General: No apparent distress alert and oriented x3 mood and affect normal, dressed appropriately.  HEENT: Pupils equal, extraocular movements intact  Respiratory: Patient's speak in full sentences and does not appear short of breath  Cardiovascular: No lower extremity edema, non tender, no erythema  Low back exam does have some loss of lordosis.  The patient still has weakness of the hip abductor with external rotation of the hips bilaterally patient does have a good strength of the lower extremities.  Significant more tightness in the thoracolumbar junction.  Osteopathic findings  T5 extended rotated and side bent right inhaled rib T6 extended rotated and side bent right -Extended rotated and side bent right L1 flexed rotated and side bent left L2 flexed rotated and side bent right Sacrum right on right       Assessment and Plan:  No problem-specific Assessment & Plan notes found for this encounter.    Nonallopathic problems  Decision today to treat with OMT was based on Physical Exam  After verbal consent patient was treated with HVLA, ME, FPR techniques in rib, thoracic, lumbar, and sacral  areas  Patient tolerated the procedure well with improvement in symptoms  Patient given exercises, stretches and lifestyle modifications  See medications in patient instructions if given  Patient will follow up in 4-8 weeks     The above documentation has been reviewed and is accurate and complete  [name], DO         Note: This dictation was prepared with Dragon dictation along with smaller phrase technology. Any transcriptional errors that result from this process are unintentional.

## 2022-06-10 ENCOUNTER — Ambulatory Visit (INDEPENDENT_AMBULATORY_CARE_PROVIDER_SITE_OTHER): Payer: 59 | Admitting: Family Medicine

## 2022-06-10 VITALS — BP 122/70 | HR 77 | Ht 70.0 in | Wt 239.0 lb

## 2022-06-10 DIAGNOSIS — G8929 Other chronic pain: Secondary | ICD-10-CM | POA: Diagnosis not present

## 2022-06-10 DIAGNOSIS — M9904 Segmental and somatic dysfunction of sacral region: Secondary | ICD-10-CM | POA: Diagnosis not present

## 2022-06-10 DIAGNOSIS — M545 Low back pain, unspecified: Secondary | ICD-10-CM

## 2022-06-10 DIAGNOSIS — M9902 Segmental and somatic dysfunction of thoracic region: Secondary | ICD-10-CM | POA: Diagnosis not present

## 2022-06-10 DIAGNOSIS — M9903 Segmental and somatic dysfunction of lumbar region: Secondary | ICD-10-CM

## 2022-06-10 NOTE — Assessment & Plan Note (Signed)
Chronic problem but overall stable at the moment.  Avoid anything on discussed over core strengthening.  Patient is working on this quite diligently.  Increase activity slowly otherwise.  Will follow-up with me again in 6 to 8 weeks

## 2022-06-10 NOTE — Patient Instructions (Signed)
Do prescribed exercises at least 3x a week Shorter stride length See you again in 6-8 weeks

## 2022-06-16 ENCOUNTER — Ambulatory Visit (HOSPITAL_BASED_OUTPATIENT_CLINIC_OR_DEPARTMENT_OTHER): Payer: 59 | Attending: Family Medicine | Admitting: Physical Therapy

## 2022-06-16 DIAGNOSIS — M5459 Other low back pain: Secondary | ICD-10-CM | POA: Insufficient documentation

## 2022-06-16 DIAGNOSIS — M545 Low back pain, unspecified: Secondary | ICD-10-CM | POA: Diagnosis not present

## 2022-06-16 DIAGNOSIS — M25562 Pain in left knee: Secondary | ICD-10-CM | POA: Insufficient documentation

## 2022-06-16 DIAGNOSIS — G8929 Other chronic pain: Secondary | ICD-10-CM | POA: Insufficient documentation

## 2022-06-16 DIAGNOSIS — M6281 Muscle weakness (generalized): Secondary | ICD-10-CM | POA: Diagnosis not present

## 2022-06-16 NOTE — Therapy (Addendum)
OUTPATIENT PHYSICAL THERAPY LOWER EXTREMITY TREATMENT   Patient Name: Zachary Graham MRN: [id number] DOB:[birth year], 62 y.o., male Today's Date: 05/31/2022   PT End of Session - 06/16/22 0924     Visit Number 10    Number of Visits 16    Date for PT Re-Evaluation 06/25/22    Authorization Type [employer]- [employer]    PT Start Time [phone]   got stuck in an accident   PT Stop Time 0847    PT Time Calculation (min) 32 min    Activity Tolerance Patient tolerated treatment well    Behavior During Therapy WFL for tasks assessed/performed                 Past Medical History:  Diagnosis Date   Diabetes mellitus    GERD (gastroesophageal reflux disease)    Heart murmur    Hyperlipidemia    Hypertension    OSA (obstructive sleep apnea)    Pneumonia    Past Surgical History:  Procedure Laterality Date   CERVICAL DISC ARTHROPLASTY N/A 03/16/2017   Procedure: CERVICAL FIVE- CERVICAL SIX [place] ARTHROPLASTY;  Surgeon: Jovita Gamma, MD;  Location: [facility];  Service: Neurosurgery;  Laterality: N/A;  CERVICAL 5- CERVICAL 6 DISC ARTHROPLASTY   COLONOSCOPY     TONSILLECTOMY     Patient Active Problem List   Diagnosis Date Noted   Preglaucoma, unspecified, bilateral 01/14/2022   Seborrheic keratosis 01/14/2022   Type 1 diabetes mellitus with hyperglycemia ([place]) 01/14/2022   Vasomotor rhinitis 01/14/2022   Adult attention deficit hyperactivity disorder 01/14/2022   Allergy to shellfish 01/14/2022   Dysphagia, oropharyngeal phase 01/14/2022   Gastro-esophageal reflux disease without esophagitis 01/14/2022   Low back pain 10/29/2021   Iron deficiency anemia secondary to inadequate dietary iron intake 02/02/2021   Frequent PVCs 10/28/2020   Hypertension    Chronic prostatitis without hematuria 05/08/2019   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 ([place]) 03/20/2018   Mild nonproliferative diabetic retinopathy of both eyes without macular edema associated  with type 2 diabetes mellitus ([place]) 08/31/2017   Mild cataract of right eye 08/31/2017   HNP (herniated nucleus pulposus), cervical 03/16/2017   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 ([place]) 01/06/2017   Herniation of cervical intervertebral disc with radiculopathy 01/04/2017   Current mild episode of major depressive disorder without prior episode ([place]) 12/02/2016   Diabetic neuropathy ([place]) 03/01/2016   Obesity, Class II, 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-39.9, with comorbidity (see actual BMI) 04/24/2015   Type 1 diabetes mellitus with hyperglycemia, with long-term current use of insulin ([place]) 04/04/2015   Patellofemoral stress syndrome of left knee 03/19/2015   Allergic rhinitis due to pollen 11/14/2014   Equinus deformity of foot, acquired 05/29/2014   ADHD (attention deficit hyperactivity disorder), inattentive type 10/24/2013   Vitamin D deficiency 02/28/2013   Pulmonic stenosis 05/25/2012   Pure hyperglyceridemia 05/25/2012   Hyperlipidemia with target LDL less than 100 09/20/2011   OSA (obstructive sleep apnea) 09/20/2011   BPH (benign prostatic hyperplasia) 09/20/2011   Routine general medical examination at a health care facility 09/20/2011    PCP: Scarlette Calico, MD  REFERRING PROVIDER: [name]  REFERRING DIAG: Low back pain   THERAPY DIAG:  Other low back pain  Chronic pain of left knee  Pain, lumbar region  Muscle weakness (generalized)  Rationale for Evaluation and Treatment: Rehabilitation  ONSET DATE:   SUBJECTIVE:   SUBJECTIVE STATEMENT:  No new complaints. The patient reports he  has been working on his exercises as much as able.    Eval: End of August, pt bent over to reach a piece of paper on floor, had significant back pain, L side. Has had only very mild previous back pain.  Now: significantly better, just having mild Low back soreness bilaterally He states he is still not able, and is fearful for forward bending.  Pt works  very long hours.  Joined gym: [employer] , has not been much due to hurting his back.  Neck: doing well . L shoulder: states limited for elevation   PERTINENT HISTORY: HTN, DM   PAIN:  Are you having pain? Yes: NPRS scale: 0/10 Pain location: bil low back Pain description: sore, tight Aggravating factors: increased activity/end of the day  Relieving factors: none stated     PRECAUTIONS: None  WEIGHT BEARING RESTRICTIONS: No  FALLS:  Has patient fallen in last 6 months? No   PLOF: Independent  PATIENT GOALS: Improve mobility, pain   OBJECTIVE: updated 12/29  DIAGNOSTIC FINDINGS:    COGNITION: Overall cognitive status: Within functional limits for tasks assessed     POSTURE: stiff back and upper body posture, mild fwd head and rounded shoulders    PALPATION: Tightness in bil HS R(significant) >L.   Hypomobile R hip flex and IR   LOWER EXTREMITY ROM:  Lumbar: flexion: mod limitation:        Extension: mod/significant limitation      SB: mod limitation bil  Active ROM Right 12/29 Left 12/29  Hip flexion Mild/mod limitation WFL  Hip extension    Hip abduction    Hip adduction    Hip internal rotation Significant limitation Mild limitation  Hip external rotation [facility]  Knee flexion [facility]  Knee extension [facility]  Ankle dorsiflexion    Ankle plantarflexion    Ankle inversion    Ankle eversion     (Blank rows = not tested)  LOWER EXTREMITY MMT:  MMT Right 12/29 Left 12/29  Hip flexion 4+ 4+  Hip extension    Hip abduction 4 4  Hip adduction    Hip internal rotation    Hip external rotation    Knee flexion 5 5  Knee extension 5 5  Ankle dorsiflexion    Ankle plantarflexion    Ankle inversion    Ankle eversion     (Blank rows = not tested)   LOWER EXTREMITY SPECIAL TESTS:  Significant tightness R>L Hamstrings,  Neg SLR,   Hypomobile R hip flex and IR,    TODAY'S TREATMENT:                            1/31   Cybex leg press 70 lbs   Seat 6 back 4  3x15   Life fitness 40 lbs 3x15   Cable row 15 lbs  Cable extension  0 lbs  Pallof press 5lbs  Chop 5 lbs   Knee extension machine 10 lbs                  05/31/22: Therapeutic Exercise: Aerobic:    Supine: Bridging x 15;   TA contraction with breathing x 10; hip add iso with ball x 15;  Seated:    seated hip hinge fwd x 10;  Sit to stand x 5;   Standing:  Rows Blue TB x 20; Low Rows RTB x 20;  Squats at chair x 10 with education on mechanics;  Squat to pick up KB 15 lb x 10 with education on back mechanics;  Stretches: Seated HSS 30 sec x 3 bil; ITB stretch with strap x 3 bil;  Shoulder flexion with wand x 15;    Neuromuscular Re-education: Manual Therapy: Hip PROM all motions. Shoulder Flexion PROM;                                                                                      05/14/22: Therapeutic Exercise: Aerobic:   Trial for bike L2 x 3 min ( unable- pinching/pain in hips)  Supine: Bridging x 15;   TA contraction x 10;  Seated:   hip add/IR squeeze x 10;  Education and practice for optimal/upright seated posture x 5 min - (feet resting on the ground and working towards upright/neutral spine posture with more anterior pelvic tilt) , seated hip hinge fwd x 10;  Standing:   Stretches: Seated HSS 30 sec x 3 bil; ITB stretch with strap x 3 bil;  Shoulder flexion with wand x 15;    Neuromuscular Re-education: Manual Therapy: Hip PROM all motions. ,    05/06/22: Therapeutic Exercise: Aerobic:  Supine: Bridging x 15;   hip ER butterfly to neutral position x 20, 3 sec holds;  TA contraction x 10;  Seated:   hip add/IR squeeze x 10;  Education and practice for optimal/upright seated posture x 5 min - (feet resting on the ground and working towards upright/neutral spine posture with more anterior pelvic tilt) , seated hip hinge fwd x 10;  Standing:  Rows GTB x 20;  Paloff press double RTB x 20 bil; Education and practice for fwd bend, with and without hip hinge  motion  x 5 each.  Stretches: Seated HSS 30 sec x 3 bil;     Neuromuscular Re-education: Manual Therapy: Hip PROM ,   04/19/22: Therapeutic Exercise: Aerobic: Recumbent bike L 3 x 5 min (pinching in hips)  Supine: Bridging x 15;    hip ER butterfly to neutral position x 20, 3 sec holds;  TA contraction x 10;  Seated:   hip add/IR squeeze x 10;  Education and practice for optimal/upright seated posture x 5 min - (feet resting on the ground and working towards upright/neutral spine posture with more anterior pelvic tilt)  Standing:  Rows GTB x 20;  Paloff press double RTB x 20 bil;  Stretches: Seated HSS 30 sec x 3 bil;     Neuromuscular Re-education: Manual Therapy: Hip PROM ,       PATIENT EDUCATION:  Education details: reviewed HEP Person educated: Patient Education method: Consulting civil engineer, Demonstration, Tactile cues, Verbal cues, and Handouts Education comprehension: verbalized understanding, returned demonstration, verbal cues required, tactile cues required, and needs further education   HOME EXERCISE PROGRAM:  Access Code: [id number]   ASSESSMENT:   CLINICAL IMPRESSION: Patient limited by time today. We were still able to go through several gym exercises. We reviewed set up and fit for all machines. We kept his weight light and went through 1 set for each 2nd to time. He could feel mild tension in his back but no pain. We will continue to progress as tolerated.  Pt with improved understanding of seated posture and neutral alignment. He continues to have much difficulty with this due to stiffness and limited hip ROM, but is doing more during the day and with HEP to improve.  He has more difficulty with TA contraction today, and will benefit from further practice with this. He is scheduled to be seen in PT in gym setting to review safety with use of machines.    Eval: Patient presents with primary complaint of increased pain in low back. He did have significant pain in previous  months that has improved. He has significant movement limitations, with limited and fearful flexion and bending. He has decreased core strength, and lack of effective HEP for his diagnosis. Pt to benefit from skilled PT to improve deficits and pain.   OBJECTIVE IMPAIRMENTS: decreased activity tolerance, decreased mobility, decreased ROM, decreased strength, hypomobility, increased muscle spasms, impaired flexibility, improper body mechanics, and pain.   ACTIVITY LIMITATIONS: lifting, bending, standing, squatting, sleeping, stairs, and locomotion level  PARTICIPATION LIMITATIONS: laundry, shopping, community activity, and yard work  PERSONAL FACTORS: none are also affecting patient's functional outcome.   REHAB POTENTIAL: Good  CLINICAL DECISION MAKING: Stable/uncomplicated  EVALUATION COMPLEXITY: Low   GOALS: Goals reviewed with patient? Yes  SHORT TERM GOALS: Target date: 03/31/2022   Pt to be independent with initial HEP  Goal status: MET    LONG TERM GOALS: Target date: 06/25/2022    Pt to be independent with final HEP  Goal status: MET  2.  Pt to demo improved ROM for lumbar flexion to be [facility] for pt age.   Goal status: IN PROGRESS  3.  Pt to demo ability for bend/lift/squat with optimal mechanics, to improve ability for IADLS and work duties   Goal status: IN PROGRESS  4.  Pt to report decreased pain in back to 0-2/10 with activity and exercise.  Goal status: MET    PLAN:  PT FREQUENCY: 1-2x/week  PT DURATION: 6 weeks  PLANNED INTERVENTIONS: Therapeutic exercises, Therapeutic activity, Neuromuscular re-education, Gait training, Patient/Family education, Self Care, Joint mobilization, Joint manipulation, Stair training, Orthotic/Fit training, DME instructions, Dry Needling, Electrical stimulation, Spinal manipulation, Spinal mobilization, Cryotherapy, Moist heat, Taping, Vasopneumatic device, Traction, Ultrasound, Ionotophoresis '4mg'$ /ml Dexamethasone, and  Manual therapy   PLAN FOR NEXT SESSION:   Gym equipment: Review:  Nu Step and  Elliptical   (unable to do bike)  Leg Press  (foot/knee alignment)  Other LE strength: Knee Ext/ [place]  Row/scapular strength Review UE press/other  if there is time  No Overhead press (shoulder stiffness and neck fusion)  * Review TA in supine if there is time  *Pt will benefit from continued priority of work on mobility for back and hips with stretching, but would benefit from going to gym for strengthening if he feels safe to do so.  Carolyne Littles PT DPT  9:31 AM  06/16/22   PHYSICAL THERAPY DISCHARGE SUMMARY  Visits from Start of Care:10 Plan: Patient agrees to discharge.  Patient goals were met. Patient is being discharged due to meeting the stated rehab goals.      Lyndee Hensen, PT, DPT 2:35 PM  07/15/22

## 2022-06-17 ENCOUNTER — Other Ambulatory Visit (HOSPITAL_COMMUNITY): Payer: Self-pay

## 2022-07-02 ENCOUNTER — Other Ambulatory Visit (HOSPITAL_COMMUNITY): Payer: Self-pay

## 2022-07-03 ENCOUNTER — Other Ambulatory Visit: Payer: Self-pay | Admitting: Internal Medicine

## 2022-07-03 ENCOUNTER — Encounter: Payer: Self-pay | Admitting: Internal Medicine

## 2022-07-03 DIAGNOSIS — J301 Allergic rhinitis due to pollen: Secondary | ICD-10-CM

## 2022-07-03 DIAGNOSIS — F900 Attention-deficit hyperactivity disorder, predominantly inattentive type: Secondary | ICD-10-CM

## 2022-07-04 MED ORDER — LEVOCETIRIZINE DIHYDROCHLORIDE 5 MG PO TABS
ORAL_TABLET | Freq: Every evening | ORAL | 1 refills | Status: DC
  Filled 2022-07-04: qty 90, 90d supply, fill #0
  Filled 2022-12-06: qty 90, 90d supply, fill #1

## 2022-07-04 MED ORDER — AMPHETAMINE-DEXTROAMPHET ER 20 MG PO CP24
20.0000 mg | ORAL_CAPSULE | Freq: Every morning | ORAL | 0 refills | Status: DC
  Filled 2022-07-04: qty 30, 30d supply, fill #0

## 2022-07-05 ENCOUNTER — Other Ambulatory Visit (HOSPITAL_COMMUNITY): Payer: Self-pay

## 2022-07-05 ENCOUNTER — Other Ambulatory Visit: Payer: Self-pay

## 2022-07-13 ENCOUNTER — Other Ambulatory Visit (HOSPITAL_COMMUNITY): Payer: Self-pay

## 2022-07-14 ENCOUNTER — Other Ambulatory Visit: Payer: Self-pay

## 2022-07-14 ENCOUNTER — Other Ambulatory Visit (HOSPITAL_COMMUNITY): Payer: Self-pay

## 2022-07-20 ENCOUNTER — Other Ambulatory Visit (HOSPITAL_COMMUNITY): Payer: Self-pay

## 2022-07-20 ENCOUNTER — Other Ambulatory Visit: Payer: Self-pay

## 2022-07-23 ENCOUNTER — Other Ambulatory Visit (HOSPITAL_COMMUNITY): Payer: Self-pay

## 2022-07-23 DIAGNOSIS — K219 Gastro-esophageal reflux disease without esophagitis: Secondary | ICD-10-CM | POA: Diagnosis not present

## 2022-07-23 DIAGNOSIS — Z91013 Allergy to seafood: Secondary | ICD-10-CM | POA: Diagnosis not present

## 2022-07-23 DIAGNOSIS — R682 Dry mouth, unspecified: Secondary | ICD-10-CM | POA: Diagnosis not present

## 2022-07-23 DIAGNOSIS — J300 Vasomotor rhinitis: Secondary | ICD-10-CM | POA: Diagnosis not present

## 2022-07-24 ENCOUNTER — Other Ambulatory Visit (HOSPITAL_COMMUNITY): Payer: Self-pay

## 2022-07-26 ENCOUNTER — Other Ambulatory Visit: Payer: Self-pay

## 2022-07-26 ENCOUNTER — Other Ambulatory Visit (HOSPITAL_COMMUNITY): Payer: Self-pay

## 2022-07-26 ENCOUNTER — Other Ambulatory Visit: Payer: Self-pay | Admitting: Internal Medicine

## 2022-07-26 DIAGNOSIS — E1065 Type 1 diabetes mellitus with hyperglycemia: Secondary | ICD-10-CM

## 2022-07-26 LAB — LAB REPORT - SCANNED
A1c: 7.7
EGFR: 100

## 2022-07-26 MED ORDER — DEXCOM G6 TRANSMITTER MISC
1.0000 | 3 refills | Status: DC
  Filled 2022-07-26: qty 1, 90d supply, fill #0

## 2022-07-27 NOTE — Progress Notes (Unsigned)
Zachary Graham [place] [address] [place] Phone: [phone] Subjective:   IVilma Graham, am serving as a scribe for Dr. Hulan Saas.  I'm seeing this patient by the request  of:  [name], MD  CC: Low back pain  [id number]  DEAKEN GILLITZER is a 62 y.o. male coming in with complaint of back and neck pain. OMT 06/10/2022. Patient states doing well. Right hip is a little bothersome. No other concerns.  Medications patient has been prescribed: Hydroxizine, Vit D  Taking:         Reviewed prior external information including notes and imaging from previsou exam, outside providers and external EMR if available.   As well as notes that were available from care everywhere and other healthcare systems.  Past medical history, social, surgical and family history all reviewed in electronic medical record.  No pertanent information unless stated regarding to the chief complaint.   Past Medical History:  Diagnosis Date   Diabetes mellitus    GERD (gastroesophageal reflux disease)    Heart murmur    Hyperlipidemia    Hypertension    OSA (obstructive sleep apnea)    Pneumonia     Allergies  Allergen Reactions   Lisinopril Swelling and Other (See Comments)    Extreme facial swelling causing hospitalization   Shellfish Allergy Anaphylaxis   Zetia [Ezetimibe] Other (See Comments)    ABDOMINAL CRAMPING   Dulaglutide Other (See Comments)   Gabapentin     Other reaction(s): Dizziness   Gabapentin (Once-Daily) Other (See Comments)   Iodine    Penicillins Other (See Comments)    UNSPECIFIED REACTION > Childhood allergy     Cymbalta [Duloxetine Hcl] Other (See Comments)    Severe feet cramps     Review of Systems:  No headache, visual changes, nausea, vomiting, diarrhea, constipation, dizziness, abdominal pain, skin rash, fevers, chills, night sweats, weight loss, swollen lymph nodes, body aches, joint swelling, chest pain,  shortness of breath, mood changes. POSITIVE muscle aches  Objective  Blood pressure 124/72, pulse 75, height '5\' 10"'$  (1.778 m), weight 241 lb (109.3 kg), SpO2 97 %.   General: No apparent distress alert and oriented x3 mood and affect normal, dressed appropriately.  HEENT: Pupils equal, extraocular movements intact  Respiratory: Patient's speak in full sentences and does not appear short of breath  Cardiovascular: No lower extremity edema, non tender, no erythema  Increasing tightness noted in the kyphotic thoracic back.  Tightness noted in the lumbar spine.  Still has positive FABER test right greater than left.  Osteopathic findings   T9 extended rotated and side bent left L2 flexed rotated and side bent right Sacrum right on right       Assessment and Plan:  Low back pain Continues to have back pain, continues to work a significant amount and has a significant amount of stress secondary to his job. Discussed with patient about icing regimen and home exercises.  If patient continues to have difficulty we will consider using FMLA.  Patient wants to hold off on labs at this moment.  Follow-up again in 6 to [place] problems  Decision today to treat with OMT was based on Physical Exam  After verbal consent patient was treated with HVLA, ME, FPR techniques in thoracic, lumbar, and sacral  areas  Patient tolerated the procedure well with improvement in symptoms  Patient given exercises, stretches and lifestyle modifications  See medications in patient  instructions if given  Patient will follow up in 4-8 weeks    The above documentation has been reviewed and is accurate and complete [name], DO          Note: This dictation was prepared with Dragon dictation along with smaller phrase technology. Any transcriptional errors that result from this process are unintentional.

## 2022-07-28 ENCOUNTER — Ambulatory Visit: Payer: 59 | Admitting: Internal Medicine

## 2022-07-28 ENCOUNTER — Other Ambulatory Visit (HOSPITAL_COMMUNITY): Payer: Self-pay

## 2022-07-28 ENCOUNTER — Encounter: Payer: Self-pay | Admitting: Internal Medicine

## 2022-07-28 VITALS — BP 136/78 | HR 85 | Temp 98.5°F | Ht 70.0 in | Wt 238.0 lb

## 2022-07-28 DIAGNOSIS — F900 Attention-deficit hyperactivity disorder, predominantly inattentive type: Secondary | ICD-10-CM | POA: Diagnosis not present

## 2022-07-28 DIAGNOSIS — I370 Nonrheumatic pulmonary valve stenosis: Secondary | ICD-10-CM

## 2022-07-28 DIAGNOSIS — I10 Essential (primary) hypertension: Secondary | ICD-10-CM | POA: Diagnosis not present

## 2022-07-28 DIAGNOSIS — E1065 Type 1 diabetes mellitus with hyperglycemia: Secondary | ICD-10-CM | POA: Diagnosis not present

## 2022-07-28 MED ORDER — AMPHETAMINE-DEXTROAMPHET ER 20 MG PO CP24
20.0000 mg | ORAL_CAPSULE | Freq: Every morning | ORAL | 0 refills | Status: DC
  Filled 2022-07-28 – 2022-08-11 (×2): qty 30, 30d supply, fill #0

## 2022-07-28 NOTE — Progress Notes (Signed)
Subjective:  Patient ID: Zachary Graham, male    DOB: Mar 01, 1961  Age: 62 y.o. MRN: [id number]  CC: Diabetes   HPI Zachary Graham presents for f/up ---  He recently had labs done at the [place]. He is active and denies DOE, CP, SOB, edema.  Outpatient Medications Prior to Visit  Medication Sig Dispense Refill   Alpha-Lipoic Acid 600 MG CAPS Take 1 capsule by mouth daily.      Ascorbic Acid (VITAMIN C) 1000 MG tablet Take 1,000 mg by mouth daily.     atorvastatin (LIPITOR) 80 MG tablet Take 1 tablet (80 mg total) by mouth daily. 90 tablet 1   azelastine (ASTELIN) 0.1 % nasal spray Place 2 sprays into both nostrils 2 (two) times daily. Use in each nostril as directed 90 mL 1   b complex vitamins tablet Take 1 tablet by mouth daily.     BAYER MICROLET LANCETS lancets USE TO TEST BLOOD SUGAR 6 TIMES A DAY 300 each 11   Beclomethasone Dipropionate (QNASL) 80 MCG/ACT AERS Place 2 puffs into the nose daily as needed (allergies). 10.6 g 5   Calcium-Magnesium-Vitamin D (CALCIUM 500 PO) Take 1 tablet by mouth daily.      Continuous Blood Gluc Sensor (DEXCOM G6 SENSOR) MISC Inject 1 Device into the skin continuous for 10 days. 9 each 3   Continuous Blood Gluc Transmit (DEXCOM G6 TRANSMITTER) MISC Use to monitor blood glucose, replace every 90 days. 1 each 3   cyclobenzaprine (FLEXERIL) 10 MG tablet Take 1 tablet (10 mg total) by mouth 3 (three) times daily as needed for muscle spasms. 30 tablet 0   dapagliflozin propanediol (FARXIGA) 5 MG TABS tablet Take 1 tablet (5 mg total) by mouth daily before breakfast. 90 tablet 3   EPINEPHrine 0.3 mg/0.3 mL IJ SOAJ injection Inject 0.3 mg into the muscle as needed for anaphylaxis. 1 each PRN   ferrous sulfate 325 (65 FE) MG tablet Take 1 tablet (325 mg total) by mouth daily with breakfast. 90 tablet 0   Glucagon 3 MG/DOSE POWD Place 3 mg into the nose once as needed for up to 1 dose. 1 each 11   glucose blood (CONTOUR NEXT TEST) test strip USE AS INSTRUCTED TO  CHECK SUGAR 6 TIMES DAILY 400 strip 3   GVOKE HYPOPEN 1-PACK 1 MG/0.2ML SOAJ Inject under skin 0.2 mL as needed for hypoglycemia 0.2 mL PRN   hydrOXYzine (ATARAX) 10 MG tablet Take 1 tablet by mouth at bedtime as needed. 90 tablet 0   insulin lispro (HUMALOG) 100 UNIT/ML injection Inject up to 150 units into insulin pump daily. 50 mL 3   levocetirizine (XYZAL) 5 MG tablet TAKE 1 TABLET BY MOUTH IN THE EVENING 90 tablet 1   losartan (COZAAR) 50 MG tablet TAKE 1 TABLET BY MOUTH DAILY. 90 tablet 1   Magnesium Oxide 400 MG CAPS Take 1 capsule (400 mg total) by mouth daily. 90 capsule 1   meloxicam (MOBIC) 15 MG tablet Take 1 tablet (15 mg total) by mouth daily. 30 tablet 0   metFORMIN (GLUCOPHAGE) 500 MG tablet Take 2 tablets (1,000 mg total) by mouth 2 (two) times daily before a meal. 360 tablet 3   NON FORMULARY CPAP     traZODone (DESYREL) 50 MG tablet TAKE 1 TABLET BY MOUTH AT BEDTIME 90 tablet 1   Vitamin D, Ergocalciferol, (DRISDOL) 1.25 MG (50000 UNIT) CAPS capsule TAKE 1 CAPSULE BY MOUTH EVERY 7 DAYS 12 capsule 0  amphetamine-dextroamphetamine (ADDERALL XR) 20 MG 24 hr capsule Take 1 capsule by mouth in the morning. 30 capsule 0   famotidine (PEPCID) 40 MG tablet Take 1 tablet by mouth at bedtime.     omeprazole (PRILOSEC) 40 MG capsule Take 40 mg by mouth daily.     No facility-administered medications prior to visit.    ROS Review of Systems  Constitutional: Negative.  Negative for diaphoresis, fatigue and unexpected weight change.  HENT: Negative.  Negative for trouble swallowing.   Eyes: Negative.   Respiratory: Negative.  Negative for cough, chest tightness and wheezing.   Cardiovascular:  Negative for chest pain, palpitations and leg swelling.  Gastrointestinal:  Negative for abdominal pain, constipation, diarrhea, nausea and vomiting.  Endocrine: Negative.   Genitourinary: Negative.  Negative for difficulty urinating and dysuria.  Musculoskeletal: Negative.   Skin: Negative.    Neurological: Negative.  Negative for dizziness and weakness.  Hematological:  Negative for adenopathy. Does not bruise/bleed easily.  Psychiatric/Behavioral: Negative.      Objective:  BP 136/78 (BP Location: Right Arm, Patient Position: Sitting, Cuff Size: Large)   Pulse 85   Temp 98.5 F (36.9 C) (Oral)   Ht 5\' 10"  (1.778 m)   Wt 238 lb (108 kg)   SpO2 94%   BMI 34.15 kg/m   BP Readings from Last 3 Encounters:  07/29/22 124/72  07/28/22 136/78  06/10/22 122/70    Wt Readings from Last 3 Encounters:  07/29/22 241 lb (109.3 kg)  07/28/22 238 lb (108 kg)  06/10/22 239 lb (108.4 kg)    Physical Exam Vitals reviewed.  HENT:     Nose: Nose normal.     Mouth/Throat:     Mouth: Mucous membranes are moist.  Eyes:     General: No scleral icterus.    Conjunctiva/sclera: Conjunctivae normal.  Cardiovascular:     Rate and Rhythm: Normal rate and regular rhythm.     Heart sounds: Murmur heard.     Systolic murmur is present with a grade of 1/6.     No friction rub. No gallop.     Comments: 1/6 SEM LU and LLSBs Pulmonary:     Effort: Pulmonary effort is normal.     Breath sounds: No stridor. No wheezing, rhonchi or rales.  Abdominal:     General: Abdomen is flat.     Palpations: There is no mass.     Tenderness: There is no abdominal tenderness. There is no guarding.     Hernia: No hernia is present.  Musculoskeletal:        General: Normal range of motion.     Cervical back: Neck supple.     Right lower leg: No edema.     Left lower leg: No edema.  Skin:    General: Skin is dry.  Neurological:     General: No focal deficit present.     Mental Status: He is alert. Mental status is at baseline.  Psychiatric:        Mood and Affect: Mood normal.        Behavior: Behavior normal.     Lab Results  Component Value Date   WBC 8.0 01/27/2022   HGB 14.0 01/27/2022   HCT 41.7 01/27/2022   PLT 218.0 01/27/2022   GLUCOSE 138 (H) 01/27/2022   CHOL 94 01/27/2022    TRIG 98.0 01/27/2022   HDL 40.70 01/27/2022   LDLDIRECT 87.0 12/02/2016   LDLCALC 34 01/27/2022   ALT 20 01/27/2022  AST 14 01/27/2022   NA 138 01/27/2022   K 4.2 01/27/2022   CL 101 01/27/2022   CREATININE 0.92 01/27/2022   BUN 15 01/27/2022   CO2 26 01/27/2022   TSH 2.48 12/27/2020   PSA 0.313 12/27/2020   HGBA1C 7.1 (A) 03/26/2022   MICROALBUR <0.7 01/27/2022    No results found.  Assessment & Plan:  Nonrheumatic pulmonary valve stenosis -     Ambulatory referral to Cardiology  ADHD (attention deficit hyperactivity disorder), inattentive type -     Amphetamine-Dextroamphet ER; Take 1 capsule by mouth in the morning.  Dispense: 30 capsule; Refill: 0  Primary hypertension- His BP is well controlled. He will forward recent [place] labs to me.  Type 1 diabetes mellitus with hyperglycemia ([place]) - He will forward recent [place] labs to me.     Follow-up: No follow-ups on file.  Scarlette Calico, MD

## 2022-07-29 ENCOUNTER — Ambulatory Visit (INDEPENDENT_AMBULATORY_CARE_PROVIDER_SITE_OTHER): Payer: 59 | Admitting: Family Medicine

## 2022-07-29 ENCOUNTER — Other Ambulatory Visit (HOSPITAL_COMMUNITY): Payer: Self-pay

## 2022-07-29 VITALS — BP 124/72 | HR 75 | Ht 70.0 in | Wt 241.0 lb

## 2022-07-29 DIAGNOSIS — M9902 Segmental and somatic dysfunction of thoracic region: Secondary | ICD-10-CM

## 2022-07-29 DIAGNOSIS — M545 Low back pain, unspecified: Secondary | ICD-10-CM

## 2022-07-29 DIAGNOSIS — G8929 Other chronic pain: Secondary | ICD-10-CM

## 2022-07-29 DIAGNOSIS — M9904 Segmental and somatic dysfunction of sacral region: Secondary | ICD-10-CM

## 2022-07-29 DIAGNOSIS — M9903 Segmental and somatic dysfunction of lumbar region: Secondary | ICD-10-CM | POA: Diagnosis not present

## 2022-07-29 NOTE — Patient Instructions (Signed)
Good to see you! Tennis ball in back right pocket See you again in 7-8 weeks

## 2022-07-29 NOTE — Assessment & Plan Note (Signed)
Continues to have back pain, continues to work a significant amount and has a significant amount of stress secondary to his job. Discussed with patient about icing regimen and home exercises.  If patient continues to have difficulty we will consider using FMLA.  Patient wants to hold off on labs at this moment.  Follow-up again in 6 to [facility]

## 2022-08-03 ENCOUNTER — Encounter: Payer: Self-pay | Admitting: Internal Medicine

## 2022-08-05 ENCOUNTER — Other Ambulatory Visit (HOSPITAL_COMMUNITY): Payer: Self-pay

## 2022-08-11 ENCOUNTER — Other Ambulatory Visit (HOSPITAL_COMMUNITY): Payer: Self-pay

## 2022-08-20 ENCOUNTER — Encounter: Payer: Self-pay | Admitting: Internal Medicine

## 2022-08-22 ENCOUNTER — Other Ambulatory Visit: Payer: Self-pay

## 2022-08-23 ENCOUNTER — Other Ambulatory Visit (HOSPITAL_COMMUNITY): Payer: Self-pay

## 2022-08-23 ENCOUNTER — Other Ambulatory Visit: Payer: Self-pay | Admitting: Internal Medicine

## 2022-08-23 DIAGNOSIS — F320 Major depressive disorder, single episode, mild: Secondary | ICD-10-CM

## 2022-08-23 MED ORDER — TRAZODONE HCL 50 MG PO TABS
ORAL_TABLET | Freq: Every day | ORAL | 1 refills | Status: DC
  Filled 2022-08-23: qty 90, 90d supply, fill #0
  Filled 2022-11-20: qty 90, 90d supply, fill #1

## 2022-09-07 ENCOUNTER — Other Ambulatory Visit (HOSPITAL_COMMUNITY): Payer: Self-pay

## 2022-09-07 ENCOUNTER — Other Ambulatory Visit: Payer: Self-pay | Admitting: Family Medicine

## 2022-09-07 MED ORDER — VITAMIN D (ERGOCALCIFEROL) 1.25 MG (50000 UNIT) PO CAPS
ORAL_CAPSULE | ORAL | 0 refills | Status: DC
  Filled 2022-09-07: qty 12, 84d supply, fill #0

## 2022-09-19 ENCOUNTER — Other Ambulatory Visit: Payer: Self-pay | Admitting: Internal Medicine

## 2022-09-19 DIAGNOSIS — E1065 Type 1 diabetes mellitus with hyperglycemia: Secondary | ICD-10-CM

## 2022-09-19 DIAGNOSIS — E139 Other specified diabetes mellitus without complications: Secondary | ICD-10-CM

## 2022-09-19 DIAGNOSIS — F900 Attention-deficit hyperactivity disorder, predominantly inattentive type: Secondary | ICD-10-CM

## 2022-09-19 DIAGNOSIS — I10 Essential (primary) hypertension: Secondary | ICD-10-CM

## 2022-09-19 MED ORDER — LOSARTAN POTASSIUM 50 MG PO TABS
50.0000 mg | ORAL_TABLET | Freq: Every day | ORAL | 1 refills | Status: DC
Start: 2022-09-19 — End: 2023-03-14
  Filled 2022-09-19: qty 90, 90d supply, fill #0
  Filled 2022-12-15: qty 90, 90d supply, fill #1

## 2022-09-19 MED ORDER — AMPHETAMINE-DEXTROAMPHET ER 20 MG PO CP24
20.0000 mg | ORAL_CAPSULE | Freq: Every morning | ORAL | 0 refills | Status: DC
Start: 2022-09-19 — End: 2022-10-19
  Filled 2022-09-19: qty 30, 30d supply, fill #0

## 2022-09-20 ENCOUNTER — Other Ambulatory Visit (HOSPITAL_COMMUNITY): Payer: Self-pay

## 2022-09-20 ENCOUNTER — Other Ambulatory Visit: Payer: Self-pay

## 2022-09-21 ENCOUNTER — Other Ambulatory Visit (HOSPITAL_COMMUNITY): Payer: Self-pay

## 2022-09-24 ENCOUNTER — Ambulatory Visit (INDEPENDENT_AMBULATORY_CARE_PROVIDER_SITE_OTHER): Payer: 59 | Admitting: Internal Medicine

## 2022-09-24 ENCOUNTER — Other Ambulatory Visit (HOSPITAL_COMMUNITY): Payer: Self-pay

## 2022-09-24 ENCOUNTER — Encounter: Payer: Self-pay | Admitting: Internal Medicine

## 2022-09-24 VITALS — BP 102/60 | HR 76 | Ht 70.0 in | Wt 237.6 lb

## 2022-09-24 DIAGNOSIS — E785 Hyperlipidemia, unspecified: Secondary | ICD-10-CM

## 2022-09-24 DIAGNOSIS — E119 Type 2 diabetes mellitus without complications: Secondary | ICD-10-CM

## 2022-09-24 DIAGNOSIS — E1065 Type 1 diabetes mellitus with hyperglycemia: Secondary | ICD-10-CM

## 2022-09-24 LAB — POCT GLYCOSYLATED HEMOGLOBIN (HGB A1C): Hemoglobin A1C: 7.4 % — AB (ref 4.0–5.6)

## 2022-09-24 MED ORDER — DEXCOM G7 SENSOR MISC
3.0000 | 4 refills | Status: DC
  Filled 2022-09-24: qty 9, 90d supply, fill #0
  Filled 2023-01-04: qty 9, 90d supply, fill #1

## 2022-09-24 NOTE — Progress Notes (Signed)
Patient ID: Zachary Graham, male   DOB: 25-Mar-1961, 62 y.o.   MRN: [id number]   HPI: Zachary Graham is a 62 y.o.-year-old male, presenting for f/u for DM1, dx 1993, insulin-dependent, uncontrolled, with complications (mild sensory peripheral neuropathy; hypoglycemia episodes, DR). Last visit 6 months ago.  Interim history: No increased urination, blurry vision, nausea. He had an episode of chest pressure - will have an appt with cardiology. He continues to have long hours at work and significantly increased stress and burnout.   He had some defective sensors since last visit.  Insulin pump: - Started in 2006 - Medtronic 670 G pump - now t:slim x2 - started beginning of 10/2021  CGM: -Libre CGM as a backup -now Dexcom G6 CGM  Insulin: - Prev. Humalog >> Lyumjev  (50 vials for 3 months).  He initially lost 15 pounds after switching from Humalog to Lyumjev. - before last visit, he wanted to get back to NovoLog due to Lyumjev not being approved in the pump  - then Lyumjev again, but this was causing burning at the infusion site  - then NovoLog  - now Humalog  Reviewed HbA1c levels: Results Order Name Results Value Reference Range Date Interpretation  HGB A1C HEMOGLOBIN A1C/HEMOGLOBIN.TOTAL IN BLOOD 7.7 4.1 - 6.5 07/26/2022 H   Lab Results  Component Value Date   HGBA1C 7.1 (A) 03/26/2022   HGBA1C 7.6 (H) 01/27/2022   HGBA1C 7.4 (A) 11/30/2021   HGBA1C 7.8 (A) 07/24/2021   HGBA1C 8.0 12/27/2020   HGBA1C 7.6 (A) 12/05/2020   HGBA1C 8.3 (H) 10/28/2020   HGBA1C 7.2 (A) 07/24/2020   HGBA1C 7.2 (A) 03/26/2020   HGBA1C 7.3 (A) 11/20/2019   HGBA1C 7.4 (A) 08/02/2019   HGBA1C 7.4 (A) 04/06/2019   HGBA1C 7.6 (A) 12/08/2018   HGBA1C 8.2 (H) 10/19/2018   HGBA1C 7.7 (A) 05/02/2018   HGBA1C 7.3 (A) 01/05/2018   HGBA1C 8.1 (A) 10/03/2017   HGBA1C 7.6 03/10/2017   HGBA1C 9.7 (H) 12/02/2016   HGBA1C 7.3 04/22/2016  10/2019: HbA1c calculated from fructosamine: 6.3%  Pump settings: -  basal: 12 AM: 1.15 3 AM: 1.10 >> 1.15 4 AM: 1.30 6:30 AM: 1.60 8 AM: 1.90 10 AM: 1.65 12 PM: 1.45 4 PM: 1.35  8 pm: 1.60 >> 1.55 9 PM: 1.55 10:30 PM: 1.45 - bolus:  - Insulin to carb ratio: 1:3 , - forgot! - Insulin sensitivity factor:  25  - target: 120-120 - Active insulin time: 3 hours  TDD basal: 39% >> 36% >> 32% >> 34% >> 33% >> 33% TDD bolus: 61% >> 64% >> 68% >> 66% >> 67% >> 67% TDD 122 units >> 210-240  >> 117-140 units a day - Bolus wizard: On - Changes the pump site:  every 1-2 >> every 2-3 days   He is also on:  - Metformin 500 >> 1000 mg twice a day - Actos 45 mg daily-started 12/2019 >> decreased to 22.5 mg in 01/2020 >> stopped - Farxiga 5 mg before breakfast-started 07/2021 We tried Trulicy in 09/2018 but he stopped in 02/2019 due to nausea and abdominal pain.  He checks his sugars more than 4 times a day with his Dexcom CGM:  Prev.:    Prev.:   Lowest: 40 >> 40 >> 50s. Highest: 380 >> 400 >> 300s. He has a history of a severe hypoglycemic episode in 2016.  He has a glucagon kit at home (refilled by the [place]). No previous DKA admissions.  -No CKD, last  BUN/creatinine:  08/01/2022: Creatinine 0.867, GFR 100 Lab Results  Component Value Date   BUN 15 01/27/2022   CREATININE 0.92 01/27/2022  04/11/2018: 10/0.778, ACR 11.1 On Cozaar.  -+ HL; last set of lipids: 03/19/2022: 110/141/48/34 Lab Results  Component Value Date   CHOL 94 01/27/2022   HDL 40.70 01/27/2022   LDLCALC 34 01/27/2022   LDLDIRECT 87.0 12/02/2016   TRIG 98.0 01/27/2022   CHOLHDL 2 01/27/2022  06/23/2020: 79/74/43/21 On high-dose Lipitor.  - last eye exam was in 09/2021: No DR, previously + Mild NPDR. [facility].   -+ Numbness and tingling in his feet.  This has improved on the B complex.  Last foot exam-11/30/2021. B12 was normal, 855, on 01/28/2021.  He also has a history of GERD, HTN, OSA.  Latest TSH was normal: TSH THYROTROPIN [UNITS/VOLUME] IN SERUM OR PLASMA  2.47 0.34 - 3.77 03/19/2022     Lab Results  Component Value Date   TSH 2.48 12/27/2020  04/11/2018: TSH 3.02  Vitamin D levels have been normal: Lab Results  Component Value Date   VD25OH 74.87 10/28/2020   VD25OH 59.85 10/23/2019   VD25OH 68.22 10/19/2018   VD25OH 83.45 10/03/2017   VD25OH 41.95 06/06/2015   VD25OH 35.03 07/17/2014   VD25OH 44 03/01/2013  04/11/2018: vitamin D 46.7.   Previously on ergocalciferol.  He has a history of adhesive capsulitis >> improved.  Previously getting occasional steroid injections but we discussed to reduce these as much as possible.  On meloxicam.  ROS: + see HPI  I reviewed pt's medications, allergies, PMH, social hx, family hx, and changes were documented in the history of present illness. Otherwise, unchanged from my initial visit note.  Past Medical History:  Diagnosis Date   Diabetes mellitus    GERD (gastroesophageal reflux disease)    Heart murmur    Hyperlipidemia    Hypertension    OSA (obstructive sleep apnea)    Pneumonia    Past Surgical History:  Procedure Laterality Date   CERVICAL DISC ARTHROPLASTY N/A 03/16/2017   Procedure: CERVICAL FIVE- CERVICAL SIX DISC ARTHROPLASTY;  Surgeon: Shirlean Kelly, MD;  Location: [facility] OR;  Service: Neurosurgery;  Laterality: N/A;  CERVICAL 5- CERVICAL 6 DISC ARTHROPLASTY   COLONOSCOPY     TONSILLECTOMY     Social History   Socioeconomic History   Marital status: Married    Spouse name: Not on file   Number of children: 3   Years of education: Not on file   Highest education level: Not on file  Occupational History   Occupation: RADIATION SAFETY OFFICER    Employer: [employer]  Tobacco Use   Smoking status: Never    Passive exposure: Never   Smokeless tobacco: Never  Vaping Use   Vaping Use: Never used  Substance and Sexual Activity   Alcohol use: No    Alcohol/week: 0.0 standard drinks of alcohol   Drug use: No   Sexual activity: Yes    Partners: Female  Other  Topics Concern   Not on file  Social History Narrative   Regular exercise: runs 3 miles 3x week, takes one day off per week to rest   Caffeine use: stopped all diet soda on 04/23/15 per advice from Sports MD   Social Determinants of Health   Financial Resource Strain: Not on file  Food Insecurity: Not on file  Transportation Needs: Not on file  Physical Activity: Not on file  Stress: Not on file  Social Connections: Not on file  Intimate Partner Violence: Not on file   Current Outpatient Medications on File Prior to Visit  Medication Sig Dispense Refill   Alpha-Lipoic Acid 600 MG CAPS Take 1 capsule by mouth daily.      amphetamine-dextroamphetamine (ADDERALL XR) 20 MG 24 hr capsule Take 1 capsule by mouth in the morning. 30 capsule 0   Ascorbic Acid (VITAMIN C) 1000 MG tablet Take 1,000 mg by mouth daily.     atorvastatin (LIPITOR) 80 MG tablet Take 1 tablet (80 mg total) by mouth daily. 90 tablet 1   azelastine (ASTELIN) 0.1 % nasal spray Place 2 sprays into both nostrils 2 (two) times daily. Use in each nostril as directed 90 mL 1   b complex vitamins tablet Take 1 tablet by mouth daily.     BAYER MICROLET LANCETS lancets USE TO TEST BLOOD SUGAR 6 TIMES A DAY 300 each 11   Beclomethasone Dipropionate (QNASL) 80 MCG/ACT AERS Place 2 puffs into the nose daily as needed (allergies). 10.6 g 5   Calcium-Magnesium-Vitamin D (CALCIUM 500 PO) Take 1 tablet by mouth daily.      Continuous Blood Gluc Transmit (DEXCOM G6 TRANSMITTER) MISC Use to monitor blood glucose, replace every 90 days. 1 each 3   Continuous Glucose Sensor (DEXCOM G6 SENSOR) MISC Inject 1 Device into the skin continuous for 10 days. 9 each 3   cyclobenzaprine (FLEXERIL) 10 MG tablet Take 1 tablet (10 mg total) by mouth 3 (three) times daily as needed for muscle spasms. 30 tablet 0   dapagliflozin propanediol (FARXIGA) 5 MG TABS tablet Take 1 tablet (5 mg total) by mouth daily before breakfast. 90 tablet 3   EPINEPHrine 0.3  mg/0.3 mL IJ SOAJ injection Inject 0.3 mg into the muscle as needed for anaphylaxis. 1 each PRN   famotidine (PEPCID) 40 MG tablet Take 1 tablet by mouth at bedtime.     ferrous sulfate 325 (65 FE) MG tablet Take 1 tablet (325 mg total) by mouth daily with breakfast. 90 tablet 0   Glucagon 3 MG/DOSE POWD Place 3 mg into the nose once as needed for up to 1 dose. 1 each 11   glucose blood (CONTOUR NEXT TEST) test strip USE AS INSTRUCTED TO CHECK SUGAR 6 TIMES DAILY 400 strip 3   GVOKE HYPOPEN 1-PACK 1 MG/0.2ML SOAJ Inject under skin 0.2 mL as needed for hypoglycemia 0.2 mL PRN   hydrOXYzine (ATARAX) 10 MG tablet Take 1 tablet by mouth at bedtime as needed. 90 tablet 0   insulin lispro (HUMALOG) 100 UNIT/ML injection Inject up to 150 units into insulin pump daily. 50 mL 3   levocetirizine (XYZAL) 5 MG tablet TAKE 1 TABLET BY MOUTH IN THE EVENING 90 tablet 1   losartan (COZAAR) 50 MG tablet Take 1 tablet (50 mg total) by mouth daily. 90 tablet 1   Magnesium Oxide 400 MG CAPS Take 1 capsule (400 mg total) by mouth daily. 90 capsule 1   meloxicam (MOBIC) 15 MG tablet Take 1 tablet (15 mg total) by mouth daily. 30 tablet 0   metFORMIN (GLUCOPHAGE) 500 MG tablet Take 2 tablets (1,000 mg total) by mouth 2 (two) times daily before a meal. 360 tablet 3   NON FORMULARY CPAP     omeprazole (PRILOSEC) 40 MG capsule Take 40 mg by mouth daily.     traZODone (DESYREL) 50 MG tablet TAKE 1 TABLET BY MOUTH AT BEDTIME 90 tablet 1   Vitamin D, Ergocalciferol, (DRISDOL) 1.25 MG (50000 UNIT) CAPS capsule  TAKE 1 CAPSULE BY MOUTH EVERY 7 DAYS 12 capsule 0   [DISCONTINUED] insulin aspart (NOVOLOG) 100 UNIT/ML injection Inject max 150 units daily 50 mL 3   No current facility-administered medications on file prior to visit.   Allergies  Allergen Reactions   Lisinopril Swelling and Other (See Comments)    Extreme facial swelling causing hospitalization   Shellfish Allergy Anaphylaxis   Zetia [Ezetimibe] Other (See  Comments)    ABDOMINAL CRAMPING   Dulaglutide Other (See Comments)   Gabapentin     Other reaction(s): Dizziness   Gabapentin (Once-Daily) Other (See Comments)   Iodine    Penicillins Other (See Comments)    UNSPECIFIED REACTION > Childhood allergy     Cymbalta [Duloxetine Hcl] Other (See Comments)    Severe feet cramps   Family History  Problem Relation Age of Onset   Colon cancer Maternal Grandfather    Colon cancer Paternal Grandfather    Heart disease Father        Valve replacement; pacemaker   Alcohol abuse Neg Hx    Asthma Neg Hx    Diabetes Neg Hx    Hyperlipidemia Neg Hx    Hypertension Neg Hx    Kidney disease Neg Hx    Stroke Neg Hx    PE: BP 102/60   Pulse 76   Ht 5\' 10"  (1.778 m)   Wt 237 lb 9.6 oz (107.8 kg)   SpO2 96%   BMI 34.09 kg/m    Wt Readings from Last 3 Encounters:  09/24/22 237 lb 9.6 oz (107.8 kg)  07/29/22 241 lb (109.3 kg)  07/28/22 238 lb (108 kg)   Constitutional: overweight, in NAD Eyes: EOMI, no exophthalmos ENT:  no thyromegaly, no cervical lymphadenopathy Cardiovascular: RRR, No MRG Respiratory: CTA B Musculoskeletal: no deformities Skin: moist, warm, no rashes Neurological: no tremor with outstretched hands  ASSESSMENT: 1. DM1, insulin-dependent, uncontrolled, without complications - DR - PN  - Barriers to good control: Very busy schedule >> less time to check sugars and eat but But he does a great job with bolusing during the day >> still has a lot of large boluses during the day, sometimes without documented carbs or sugars Lack of sleep >> usually sleeps max 5h a night (!) and tries to catch up in the weekend Reaches home late at night  - sometimes at 1 am >> eats >65% of the daily calories then  2. HL  PLAN:  1. Patient with longstanding, uncontrolled, type 1 diabetes, with high insulin resistance, managed on an insulin pump.  He was previously on the Medtronic pump but now on t:slim X2 integrated with the Dexcom  CGM.  He is also on oral medication with metformin and SGLT2 inhibitor.  He was previously on Actos, which we stopped.  HbA1c at last visit was best in a long time, at 7.1%.  At that time, reviewing the CGM trends, sugars were mostly fluctuating within the target range but they were increasing after lunch and then again after dinner.  He occasionally has slight lows around 3 AM.  Reviewing the individual daily traces, he was not always bolusing before meals but occasionally after meals, after the pump was already giving him automatic boluses.  We discussed about starting the bolus 15 minutes before the meal but I also advised him to strengthen his insulin to carb ratio after 2 PM until 12 AM.  He was also not introducing entire amount of carbs into the pump and we discussed  about trying to do so.  Otherwise, we did not change his regimen. -She had another HbA1c level checked at the [place] 2 months ago, this was higher, at 7.7% CGM interpretation: -At today's visit, we reviewed his CGM downloads: It appears that 61% of values are in target range (goal >70%), while 39% are higher than 180 (goal <25%), and 0% are lower than 70 (goal <4%).  The calculated average blood sugar is 176.  The projected HbA1c for the next 3 months ([facility]MI) is 7.5%. -Reviewing the CGM trends, sugars appear to be fluctuating within the target range but increasing above target around 3 to 4 PM and staying elevated until the next morning.  Upon review of his pump settings, he forgot to strengthen his insulin to carb ratios after 2 PM as suggested at last visit!  We discussed that time the plan to give him more insulin with meals will adjust how much insulin he gets per day so sugars throughout the day will improve.  I did advise him to strengthen his insulin to carb ratio from approximately 12 PM.  Otherwise, we can continue the same regimen. - I advised him to:  Patient Instructions  Please use the following pump settings: - basal: 12 AM:  1.15 4 AM: 1.30 6:30 AM: 1.60 8 AM: 1.90 10 AM: 1.65 12 PM: 1.45 4 PM: 1.35  9 PM: 1.55 10:30 PM: 1.45 - bolus:  - Insulin to carb ratio: 1:3, except 12 pm: 1:2.5 - Insulin sensitivity factor:  25  - target: 120-120 - Active insulin time: 3 hours  Please continue: - Metformin 1000 mg 2x with meals  - Farxiga 5 mg before breakfast  Please come back for a follow-up appointment in 4-6 months.  - we checked his HbA1c: 7.4% (slightly lower) - advised to check sugars at different times of the day - 4x a day, rotating check times - advised for yearly eye exams >> he is UTD - return to clinic in 4-6 months  2. HL -We reviewed his latest lipid panel from 03/2022 and the fractions were at goal -Decrease Lipitor 80 mg daily without side effects  Carlus Pavlov, MD PhD [facility] Endocrinology

## 2022-09-24 NOTE — Patient Instructions (Addendum)
Please use the following pump settings: - basal: 12 AM: 1.15 4 AM: 1.30 6:30 AM: 1.60 8 AM: 1.90 10 AM: 1.65 12 PM: 1.45 4 PM: 1.35  9 PM: 1.55 10:30 PM: 1.45 - bolus:  - Insulin to carb ratio: 1:3, except 12 pm: 1:2.5 - Insulin sensitivity factor:  25  - target: 120-120 - Active insulin time: 3 hours  Please continue: - Metformin 1000 mg 2x with meals  - Farxiga 5 mg before breakfast  Please come back for a follow-up appointment in 4-6 months.

## 2022-09-28 ENCOUNTER — Encounter: Payer: Self-pay | Admitting: Cardiovascular Disease

## 2022-09-28 NOTE — Progress Notes (Unsigned)
Cardiology Office Note:    Date:  09/29/2022   ID:  Zachary Graham, DOB 04/04/1961, MRN [id number]  PCP:  [name], MD   [facility] Providers Cardiologist:  Henrine Hayter   }    Referring MD: [name], MD   Chief Complaint  Patient presents with   pulmonic stenosis   Palpitations    History of Present Illness:    Zachary Graham is a 62 y.o. male with a hx of pulmonic stenosis, HLD, HTN, DM   Has seen Dr. Jens Som in the past  Echo in 2014 showed a peak PV gradient of 16 mmHg. RV was normal  Echo June, 2022: Normal LV systolic function with EV 55-60% Normal RV size and function Peak PV gradient is 14 mmHg.  4-5 months ago, he developed some chest pressure . ? Indigestion The chest pressure is not related to exercise,  does not exercise Not position Not related to eating or drinking   Works 70-80 hrs a week. 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  Lucky if he gets 5 hours of sleep at night      Father had a valve replacement      Past Medical History:  Diagnosis Date   Diabetes mellitus    GERD (gastroesophageal reflux disease)    Heart murmur    Hyperlipidemia    Hypertension    OSA (obstructive sleep apnea)    Pneumonia     Past Surgical History:  Procedure Laterality Date   CERVICAL DISC ARTHROPLASTY N/A 03/16/2017   Procedure: CERVICAL FIVE- CERVICAL SIX DISC ARTHROPLASTY;  Surgeon: Shirlean Kelly, MD;  Location: [facility] OR;  Service: Neurosurgery;  Laterality: N/A;  CERVICAL 5- CERVICAL 6 DISC ARTHROPLASTY   COLONOSCOPY     TONSILLECTOMY      Current Medications: Current Meds  Medication Sig   Alpha-Lipoic Acid 600 MG CAPS Take 1 capsule by mouth daily.    amphetamine-dextroamphetamine (ADDERALL XR) 20 MG 24 hr capsule Take 1 capsule by mouth in the morning.   Ascorbic Acid (VITAMIN C) 1000 MG tablet Take 1,000 mg by mouth daily.   atorvastatin (LIPITOR) 80 MG tablet Take 1 tablet (80 mg total) by mouth daily.    azelastine (ASTELIN) 0.1 % nasal spray Place 2 sprays into both nostrils 2 (two) times daily. Use in each nostril as directed   b complex vitamins tablet Take 1 tablet by mouth daily.   BAYER MICROLET LANCETS lancets USE TO TEST BLOOD SUGAR 6 TIMES A DAY   Beclomethasone Dipropionate (QNASL) 80 MCG/ACT AERS Place 2 puffs into the nose daily as needed (allergies).   Calcium-Magnesium-Vitamin D (CALCIUM 500 PO) Take 1 tablet by mouth daily.    Continuous Glucose Sensor (DEXCOM G7 SENSOR) MISC Apply 1 sensor every 10 days   cyclobenzaprine (FLEXERIL) 10 MG tablet Take 1 tablet (10 mg total) by mouth 3 (three) times daily as needed for muscle spasms.   dapagliflozin propanediol (FARXIGA) 5 MG TABS tablet Take 1 tablet (5 mg total) by mouth daily before breakfast.   EPINEPHrine 0.3 mg/0.3 mL IJ SOAJ injection Inject 0.3 mg into the muscle as needed for anaphylaxis.   famotidine (PEPCID) 40 MG tablet Take 1 tablet by mouth at bedtime.   ferrous sulfate 325 (65 FE) MG tablet Take 1 tablet (325 mg total) by mouth daily with breakfast.   Glucagon 3 MG/DOSE POWD Place 3 mg into the nose once as needed for up to 1 dose.  glucose blood (CONTOUR NEXT TEST) test strip USE AS INSTRUCTED TO CHECK SUGAR 6 TIMES DAILY   GVOKE HYPOPEN 1-PACK 1 MG/0.2ML SOAJ Inject under skin 0.2 mL as needed for hypoglycemia   hydrOXYzine (ATARAX) 10 MG tablet Take 1 tablet by mouth at bedtime as needed.   insulin lispro (HUMALOG) 100 UNIT/ML injection Inject up to 150 units into insulin pump daily.   levocetirizine (XYZAL) 5 MG tablet TAKE 1 TABLET BY MOUTH IN THE EVENING   losartan (COZAAR) 50 MG tablet Take 1 tablet (50 mg total) by mouth daily.   Magnesium Oxide 400 MG CAPS Take 1 capsule (400 mg total) by mouth daily.   meloxicam (MOBIC) 15 MG tablet Take 1 tablet (15 mg total) by mouth daily.   metFORMIN (GLUCOPHAGE) 500 MG tablet Take 2 tablets (1,000 mg total) by mouth 2 (two) times daily before a meal.   metoprolol  succinate (TOPROL XL) 25 MG 24 hr tablet Take 1 tablet (25 mg total) by mouth daily.   NON FORMULARY CPAP   omeprazole (PRILOSEC) 40 MG capsule Take 20 mg by mouth daily.   traZODone (DESYREL) 50 MG tablet TAKE 1 TABLET BY MOUTH AT BEDTIME   Vitamin D, Ergocalciferol, (DRISDOL) 1.25 MG (50000 UNIT) CAPS capsule TAKE 1 CAPSULE BY MOUTH EVERY 7 DAYS     Allergies:   Lisinopril, Shellfish allergy, Zetia [ezetimibe], Dulaglutide, Gabapentin, Gabapentin (once-daily), Iodine, Penicillins, and Cymbalta [duloxetine hcl]   Social History   Socioeconomic History   Marital status: Married    Spouse name: Not on file   Number of children: 3   Years of education: Not on file   Highest education level: Not on file  Occupational History   Occupation: RADIATION SAFETY OFFICER    Employer: [employer]  Tobacco Use   Smoking status: Never    Passive exposure: Never   Smokeless tobacco: Never  Vaping Use   Vaping Use: Never used  Substance and Sexual Activity   Alcohol use: No    Alcohol/week: 0.0 standard drinks of alcohol   Drug use: No   Sexual activity: Yes    Partners: Female  Other Topics Concern   Not on file  Social History Narrative   Regular exercise: runs 3 miles 3x week, takes one day off per week to rest   Caffeine use: stopped all diet soda on 04/23/15 per advice from Sports MD   Social Determinants of Health   Financial Resource Strain: Not on file  Food Insecurity: Not on file  Transportation Needs: Not on file  Physical Activity: Not on file  Stress: Not on file  Social Connections: Not on file     Family History: The patient's family history includes Colon cancer in his maternal grandfather and paternal grandfather; Heart disease in his father. There is no history of Alcohol abuse, Asthma, Diabetes, Hyperlipidemia, Hypertension, Kidney disease, or Stroke.  ROS:   Please see the history of present illness.     All other systems reviewed and are  negative.  EKGs/Labs/Other Studies Reviewed:    The following studies were reviewed today:   EKG:  Sep 29, 2022.  NSR .   Frequent PVCs ( couplets)   With a 1 mn step test,  he developed sinus tach with marked reduction of his ventricular ectopy   Recent Labs: 01/27/2022: ALT 20; BUN 15; Creatinine, Ser 0.92; Hemoglobin 14.0; Platelets 218.0; Potassium 4.2; Sodium 138  Recent Lipid Panel    Component Value Date/Time   CHOL 94  01/27/2022 1602   TRIG 98.0 01/27/2022 1602   HDL 40.70 01/27/2022 1602   CHOLHDL 2 01/27/2022 1602   VLDL 19.6 01/27/2022 1602   LDLCALC 34 01/27/2022 1602   LDLDIRECT 87.0 12/02/2016 1523     Risk Assessment/Calculations:                Physical Exam:    VS:  BP 136/82   Pulse 91   Ht 5\' 10"  (1.778 m)   Wt 240 lb 6.4 oz (109 kg)   SpO2 98%   BMI 34.49 kg/m     Wt Readings from Last 3 Encounters:  09/29/22 240 lb 6.4 oz (109 kg)  09/24/22 237 lb 9.6 oz (107.8 kg)  07/29/22 241 lb (109.3 kg)     GEN:  moderately obese male,  in no acute distress HEENT: Normal NECK: No JVD; No carotid bruits LYMPHATICS: No lymphadenopathy CARDIAC: RRR, , soft systolic murmur ,  frequent premature beats  RESPIRATORY:  Clear to auscultation without rales, wheezing or rhonchi  ABDOMEN: Soft, non-tender, non-distended MUSCULOSKELETAL:  No edema; No deformity  SKIN: Warm and dry NEUROLOGIC:  Alert and oriented x 3 PSYCHIATRIC:  Normal affect   ASSESSMENT:    1. Nonrheumatic pulmonary valve stenosis   2. Frequent PVCs    PLAN:    In order of problems listed above:  Pulmonary stenosis: He has a soft systolic murmur similar to what was described years ago.  He has pulmonic stenosis has been stable on previous 2 echoes.  Will reassess his pulmonic stenosis with an echocardiogram.  As long as it seems to mild and stable and as long as he does not have any RV enlargement or failure then I think we can continue to follow.  2.  Chest pain:  non cardiac    3.  PVCs :  is on Adderal ,  will add Toprol XL 25   He has lots of couplets on EKG.  He has had them for years.  These couplets resolved with a 1 minute step test.  I have encouraged him to work on getting better exercise.  Will add Toprol-XL to see if this will help.          Medication Adjustments/Labs and Tests Ordered: Current medicines are reviewed at length with the patient today.  Concerns regarding medicines are outlined above.  Orders Placed This Encounter  Procedures   EKG 12-Lead   ECHOCARDIOGRAM COMPLETE   Meds ordered this encounter  Medications   metoprolol succinate (TOPROL XL) 25 MG 24 hr tablet    Sig: Take 1 tablet (25 mg total) by mouth daily.    Dispense:  90 tablet    Refill:  3    Patient Instructions  Medication Instructions:  Your physician has recommended you make the following change in your medication:  1) START taking Toprol XL (metoprolol succinate) 25 mg daily  *If you need a refill on your cardiac medications before your next appointment, please call your pharmacy*  Testing/Procedures: Your physician has requested that you have an echocardiogram. Echocardiography is a painless test that uses sound waves to create images of your heart. It provides your doctor with information about the size and shape of your heart and how well your heart's chambers and valves are working. This procedure takes approximately one hour. There are no restrictions for this procedure. Please do NOT wear cologne, perfume, aftershave, or lotions (deodorant is allowed). Please arrive 15 minutes prior to your appointment time.  Follow-Up:  At [facility], you and your health needs are our priority.  As part of our continuing mission to provide you with exceptional heart care, we have created designated Provider Care Teams.  These Care Teams include your primary Cardiologist (physician) and Advanced Practice Providers (APPs -  Physician Assistants and Nurse  Practitioners) who all work together to provide you with the care you need, when you need it.  Your next appointment:   3 month(s)  Provider:   Dr. Delane Ginger   Signed, Kristeen Miss, MD  09/29/2022 3:17 PM    [facility]

## 2022-09-29 ENCOUNTER — Encounter: Payer: Self-pay | Admitting: Cardiovascular Disease

## 2022-09-29 ENCOUNTER — Other Ambulatory Visit: Payer: Self-pay

## 2022-09-29 ENCOUNTER — Other Ambulatory Visit (HOSPITAL_COMMUNITY): Payer: Self-pay

## 2022-09-29 ENCOUNTER — Ambulatory Visit: Payer: 59 | Attending: Cardiovascular Disease | Admitting: Cardiovascular Disease

## 2022-09-29 VITALS — BP 136/82 | HR 91 | Ht 70.0 in | Wt 240.4 lb

## 2022-09-29 DIAGNOSIS — I370 Nonrheumatic pulmonary valve stenosis: Secondary | ICD-10-CM | POA: Diagnosis not present

## 2022-09-29 DIAGNOSIS — I493 Ventricular premature depolarization: Secondary | ICD-10-CM

## 2022-09-29 MED ORDER — METOPROLOL SUCCINATE ER 25 MG PO TB24
25.0000 mg | ORAL_TABLET | Freq: Every day | ORAL | 3 refills | Status: DC
  Filled 2022-09-29: qty 90, 90d supply, fill #0

## 2022-09-29 NOTE — Patient Instructions (Signed)
Medication Instructions:  Your physician has recommended you make the following change in your medication:  1) START taking Toprol XL (metoprolol succinate) 25 mg daily  *If you need a refill on your cardiac medications before your next appointment, please call your pharmacy*  Testing/Procedures: Your physician has requested that you have an echocardiogram. Echocardiography is a painless test that uses sound waves to create images of your heart. It provides your doctor with information about the size and shape of your heart and how well your heart's chambers and valves are working. This procedure takes approximately one hour. There are no restrictions for this procedure. Please do NOT wear cologne, perfume, aftershave, or lotions (deodorant is allowed). Please arrive 15 minutes prior to your appointment time.  Follow-Up: At [facility], you and your health needs are our priority.  As part of our continuing mission to provide you with exceptional heart care, we have created designated Provider Care Teams.  These Care Teams include your primary Cardiologist (physician) and Advanced Practice Providers (APPs -  Physician Assistants and Nurse Practitioners) who all work together to provide you with the care you need, when you need it.  Your next appointment:   3 month(s)  Provider:   Dr. Delane Ginger

## 2022-09-29 NOTE — Progress Notes (Unsigned)
[name] [address] [place] [address] Phone: [phone] Subjective:   Zachary Graham, am serving as a scribe for Dr. Antoine Primas.  I'm seeing this patient by the request  of:  [name], MD  CC: back and neck pain   [id number]  Zachary Graham is a 62 y.o. male coming in with complaint of back and neck pain. OMT 07/29/2022. Patient states that his back and neck have not been bothering him since last visit. Pain in B hips deep in socket. Notes that pain is minimal but wanted to mention it. Also recently put on metoprolol.   Medications patient has been prescribed: Vit D          Reviewed prior external information including notes and imaging from previsou exam, outside providers and external EMR if available.   As well as notes that were available from care everywhere and other healthcare systems.  Past medical history, social, surgical and family history all reviewed in electronic medical record.  No pertanent information unless stated regarding to the chief complaint.   Past Medical History:  Diagnosis Date   Diabetes mellitus    GERD (gastroesophageal reflux disease)    Heart murmur    Hyperlipidemia    Hypertension    OSA (obstructive sleep apnea)    Pneumonia     Allergies  Allergen Reactions   Lisinopril Swelling and Other (See Comments)    Extreme facial swelling causing hospitalization   Shellfish Allergy Anaphylaxis   Zetia [Ezetimibe] Other (See Comments)    ABDOMINAL CRAMPING   Dulaglutide Other (See Comments)   Gabapentin     Other reaction(s): Dizziness   Gabapentin (Once-Daily) Other (See Comments)   Iodine    Penicillins Other (See Comments)    UNSPECIFIED REACTION > Childhood allergy     Cymbalta [Duloxetine Hcl] Other (See Comments)    Severe feet cramps     Review of Systems:  No headache, visual changes, nausea, vomiting, diarrhea, constipation, dizziness, abdominal pain, skin rash,  fevers, chills, night sweats, weight loss, swollen lymph nodes, body aches, joint swelling, chest pain, shortness of breath, mood changes. POSITIVE muscle aches  Objective  Blood pressure 112/72, pulse 74, height 5\' 10"  (1.778 m), weight 238 lb (108 kg), SpO2 97 %.   General: No apparent distress alert and oriented x3 mood and affect normal, dressed appropriately.  HEENT: Pupils equal, extraocular movements intact  Respiratory: Patient's speak in full sentences and does not appear short of breath  Cardiovascular: No lower extremity edema, non tender, no erythema  MSK:  Back loss of lordosis noted.  No tenderness to palpation of the paraspinal musculature.  Patient does have some tightness with FABER test.  Still external rotation noted.  Osteopathic findings  T3 extended rotated and side bent right inhaled rib T8 extended rotated and side bent left L1 flexed rotated and side bent right Sacrum right on right    Assessment and Plan:  Low back pain Tightness noted. Discussed HEP patient has been doing relatively well.  We discussed ingestible standing desk which she has not been using very much.  Increase activity.  Discussed which activities to do and which ones to avoid.  Increase activity slowly.  Follow-up with me again in 6 to 8 weeks.    Nonallopathic problems  Decision today to treat with OMT was based on Physical Exam  After verbal consent patient was treated with HVLA, ME, FPR techniques in  rib, thoracic,  lumbar, and sacral  areas  Patient tolerated the procedure well with improvement in symptoms  Patient given exercises, stretches and lifestyle modifications  See medications in patient instructions if given  Patient will follow up in 4-8 weeks     The above documentation has been reviewed and is accurate and complete Judi Saa, DO         Note: This dictation was prepared with Dragon dictation along with smaller phrase technology. Any transcriptional  errors that result from this process are unintentional.

## 2022-09-30 ENCOUNTER — Ambulatory Visit (INDEPENDENT_AMBULATORY_CARE_PROVIDER_SITE_OTHER): Payer: 59

## 2022-09-30 ENCOUNTER — Ambulatory Visit (INDEPENDENT_AMBULATORY_CARE_PROVIDER_SITE_OTHER): Payer: 59 | Admitting: Family Medicine

## 2022-09-30 VITALS — BP 112/72 | HR 74 | Ht 70.0 in | Wt 238.0 lb

## 2022-09-30 DIAGNOSIS — M9903 Segmental and somatic dysfunction of lumbar region: Secondary | ICD-10-CM

## 2022-09-30 DIAGNOSIS — M25551 Pain in right hip: Secondary | ICD-10-CM

## 2022-09-30 DIAGNOSIS — M9904 Segmental and somatic dysfunction of sacral region: Secondary | ICD-10-CM | POA: Diagnosis not present

## 2022-09-30 DIAGNOSIS — G8929 Other chronic pain: Secondary | ICD-10-CM | POA: Diagnosis not present

## 2022-09-30 DIAGNOSIS — M9908 Segmental and somatic dysfunction of rib cage: Secondary | ICD-10-CM

## 2022-09-30 DIAGNOSIS — M545 Low back pain, unspecified: Secondary | ICD-10-CM | POA: Diagnosis not present

## 2022-09-30 DIAGNOSIS — M9902 Segmental and somatic dysfunction of thoracic region: Secondary | ICD-10-CM | POA: Diagnosis not present

## 2022-09-30 DIAGNOSIS — M1611 Unilateral primary osteoarthritis, right hip: Secondary | ICD-10-CM | POA: Diagnosis not present

## 2022-09-30 NOTE — Patient Instructions (Addendum)
Xray today Great to see you Stay active See me in 6-8 weeks

## 2022-09-30 NOTE — Assessment & Plan Note (Signed)
Tightness noted. Discussed HEP patient has been doing relatively well.  We discussed ingestible standing desk which she has not been using very much.  Increase activity.  Discussed which activities to do and which ones to avoid.  Increase activity slowly.  Follow-up with me again in 6 to 8 weeks.

## 2022-10-05 ENCOUNTER — Encounter: Payer: Self-pay | Admitting: Family Medicine

## 2022-10-06 ENCOUNTER — Other Ambulatory Visit: Payer: Self-pay

## 2022-10-06 DIAGNOSIS — Z1389 Encounter for screening for other disorder: Secondary | ICD-10-CM

## 2022-10-06 LAB — HM DIABETES EYE EXAM

## 2022-10-19 ENCOUNTER — Other Ambulatory Visit (HOSPITAL_COMMUNITY): Payer: Self-pay

## 2022-10-19 ENCOUNTER — Other Ambulatory Visit: Payer: Self-pay | Admitting: Internal Medicine

## 2022-10-19 DIAGNOSIS — F900 Attention-deficit hyperactivity disorder, predominantly inattentive type: Secondary | ICD-10-CM

## 2022-10-19 MED ORDER — AMPHETAMINE-DEXTROAMPHET ER 20 MG PO CP24
20.00 mg | ORAL_CAPSULE | Freq: Every morning | ORAL | 0 refills | Status: DC
Start: 2022-10-19 — End: 2022-11-20
  Filled 2022-10-19: qty 10, 10d supply, fill #0
  Filled 2022-10-25: qty 20, 20d supply, fill #0

## 2022-10-22 ENCOUNTER — Other Ambulatory Visit (HOSPITAL_COMMUNITY): Payer: Self-pay

## 2022-10-24 ENCOUNTER — Encounter: Payer: Self-pay | Admitting: Cardiovascular Disease

## 2022-10-25 ENCOUNTER — Other Ambulatory Visit (HOSPITAL_COMMUNITY): Payer: Self-pay

## 2022-10-27 ENCOUNTER — Ambulatory Visit (INDEPENDENT_AMBULATORY_CARE_PROVIDER_SITE_OTHER): Payer: 59 | Admitting: Dermatology

## 2022-10-27 ENCOUNTER — Other Ambulatory Visit (HOSPITAL_COMMUNITY): Payer: Self-pay

## 2022-10-27 VITALS — BP 122/85 | HR 64

## 2022-10-27 DIAGNOSIS — Q828 Other specified congenital malformations of skin: Secondary | ICD-10-CM | POA: Diagnosis not present

## 2022-10-27 MED ORDER — CLOTRIMAZOLE-BETAMETHASONE 1-0.05 % EX CREA
1.0000 | TOPICAL_CREAM | Freq: Two times a day (BID) | CUTANEOUS | 1 refills | Status: AC
Start: 2022-10-27 — End: ?
  Filled 2022-10-27: qty 45, 14d supply, fill #0
  Filled 2023-02-20: qty 45, 14d supply, fill #1

## 2022-10-27 NOTE — Patient Instructions (Addendum)
We sent the following medication to your Pharmacy: Clortrimazole/betamethasone Cream, Use twice daily for 2 Weeks then STOP to prevent excessive skin thinning.  Due to recent changes in healthcare laws, you may see results of your pathology and/or laboratory studies on MyChart before the doctors have had a chance to review them. We understand that in some cases there may be results that are confusing or concerning to you. Please understand that not all results are received at the same time and often the doctors may need to interpret multiple results in order to provide you with the best plan of care or course of treatment. Therefore, we ask that you please give [place] 2 business days to thoroughly review all your results before contacting the office for clarification. Should we see a critical lab result, you will be contacted sooner.   If You Need Anything After Your Visit  If you have any questions or concerns for your doctor, please call our main line at [phone] If no one answers, please leave a voicemail as directed and we will return your call as soon as possible. Messages left after 4 pm will be answered the following business day.   You may also send [place] a message via MyChart. We typically respond to MyChart messages within 1-2 business days.  For prescription refills, please ask your pharmacy to contact our office. Our fax number is [phone].  If you have an urgent issue when the clinic is closed that cannot wait until the next business day, you can page your doctor at the number below.    Please note that while we do our best to be available for urgent issues outside of office hours, we are not available 24/7.   If you have an urgent issue and are unable to reach [place], you may choose to seek medical care at your doctor's office, retail clinic, urgent care center, or emergency room.  If you have a medical emergency, please immediately call 911 or go to the emergency department. In the  event of inclement weather, please call our main line at [phone] for an update on the status of any delays or closures.  Dermatology Medication Tips: Please keep the boxes that topical medications come in in order to help keep track of the instructions about where and how to use these. Pharmacies typically print the medication instructions only on the boxes and not directly on the medication tubes.   If your medication is too expensive, please contact our office at [phone] or send [place] a message through MyChart.   We are unable to tell what your co-pay for medications will be in advance as this is different depending on your insurance coverage. However, we may be able to find a substitute medication at lower cost or fill out paperwork to get insurance to cover a needed medication.   If a prior authorization is required to get your medication covered by your insurance company, please allow [place] 1-2 business days to complete this process.  Drug prices often vary depending on where the prescription is filled and some pharmacies may offer cheaper prices.  The website www.goodrx.com contains coupons for medications through different pharmacies. The prices here do not account for what the cost may be with help from insurance (it may be cheaper with your insurance), but the website can give you the price if you did not use any insurance.  - You can print the associated coupon and take it with your prescription to the pharmacy.  -  You may also stop by our office during regular business hours and pick up a GoodRx coupon card.  - If you need your prescription sent electronically to a different pharmacy, notify our office through [facility] or by phone at [phone]

## 2022-10-27 NOTE — Progress Notes (Signed)
   New Patient Visit   Subjective  Zachary Graham is a 62 y.o. male who presents for the following: Spot Check  Patient states he  has Abnormal spot located at the right Evette Cristal that he would like to have examined. Patient reports the areas have been there for  2-3  month(s). He  reports the areas were bothersome. The areas first appeared as a spot then spread. He described the sxs as slightly itchy and painful( He rates the .He states that the areas have not spread. Patient reports denies previously been treated for these areas. He did get Keroform Occlusive Gauze Strip 3%Bismuth that resolved most of his sxs but did not help with the discoloration. Pt denies bug bites to the area. He states he has very thin skin on his legs and he has purchased [employer] to protect his legs from his dogs. Patient denies hx of bx. Patient denies family history of skin cancer(s).    The following portions of the chart were reviewed this encounter and updated as appropriate: medications, allergies, medical history  Review of Systems:  No other skin or systemic complaints except as noted in HPI or Assessment and Plan.  Objective  Well appearing patient in no apparent distress; mood and affect are within normal limits.  A focused examination was performed of the following areas: Right lower leg  Relevant exam findings are noted in the Assessment and Plan.      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  Documentation: I have reviewed the above documentation for accuracy and completeness, and I agree with the above.  Stasia Cavalier, am acting as scribe for Langston Reusing, DO.   Langston Reusing, DO

## 2022-10-29 DIAGNOSIS — I378 Other nonrheumatic pulmonary valve disorders: Secondary | ICD-10-CM | POA: Diagnosis not present

## 2022-10-29 DIAGNOSIS — K219 Gastro-esophageal reflux disease without esophagitis: Secondary | ICD-10-CM | POA: Diagnosis not present

## 2022-10-29 DIAGNOSIS — R002 Palpitations: Secondary | ICD-10-CM | POA: Diagnosis not present

## 2022-10-29 DIAGNOSIS — I493 Ventricular premature depolarization: Secondary | ICD-10-CM | POA: Diagnosis not present

## 2022-10-29 DIAGNOSIS — E119 Type 2 diabetes mellitus without complications: Secondary | ICD-10-CM | POA: Diagnosis not present

## 2022-10-29 LAB — TSH: TSH: 3.3 (ref 0.41–5.90)

## 2022-10-29 LAB — HEPATIC FUNCTION PANEL
ALT: 21 U/L (ref 10–40)
AST: 19 (ref 14–40)
Alkaline Phosphatase: 96 (ref 25–125)
Bilirubin, Direct: 0.3
Bilirubin, Total: 0.8

## 2022-10-29 LAB — CBC AND DIFFERENTIAL
HCT: 41 (ref 41–53)
Hemoglobin: 14.1 (ref 13.5–17.5)
Platelets: 232 10*3/uL (ref 150–400)
WBC: 8.6

## 2022-10-29 LAB — LIPID PANEL
Cholesterol: 95 (ref 0–200)
HDL: 37 (ref 35–70)
LDL Cholesterol: 42
Triglycerides: 99 (ref 40–160)

## 2022-10-29 LAB — BASIC METABOLIC PANEL WITH GFR
BUN: 13 (ref 4–21)
CO2: 25 — AB (ref 13–22)
Chloride: 105 (ref 99–108)
Creatinine: 0.8 (ref 0.6–1.3)
Glucose: 165
Potassium: 4.2 meq/L (ref 3.5–5.1)
Sodium: 139 (ref 137–147)

## 2022-10-29 LAB — MICROALBUMIN / CREATININE URINE RATIO: Microalb Creat Ratio: 0

## 2022-10-29 LAB — COMPREHENSIVE METABOLIC PANEL WITH GFR
Albumin: 4.4 (ref 3.5–5.0)
Calcium: 9.4 (ref 8.7–10.7)
eGFR: 101

## 2022-10-29 LAB — MICROALBUMIN, URINE: Microalb, Ur: 0

## 2022-11-01 ENCOUNTER — Ambulatory Visit (HOSPITAL_COMMUNITY): Payer: 59 | Attending: Cardiovascular Disease

## 2022-11-01 DIAGNOSIS — I370 Nonrheumatic pulmonary valve stenosis: Secondary | ICD-10-CM | POA: Diagnosis not present

## 2022-11-01 DIAGNOSIS — I493 Ventricular premature depolarization: Secondary | ICD-10-CM | POA: Insufficient documentation

## 2022-11-01 LAB — ECHOCARDIOGRAM COMPLETE
AR max vel: 2.47 cm2
AV Area VTI: 2.2 cm2
AV Area mean vel: 2.3 cm2
AV Mean grad: 8.5 mmHg
AV Peak grad: 15.6 mmHg
Ao pk vel: 1.98 m/s
Area-P 1/2: 2.96 cm2
S' Lateral: 2.95 cm

## 2022-11-03 ENCOUNTER — Encounter: Payer: Self-pay | Admitting: Internal Medicine

## 2022-11-04 ENCOUNTER — Other Ambulatory Visit (HOSPITAL_COMMUNITY): Payer: Self-pay

## 2022-11-09 NOTE — Progress Notes (Unsigned)
[name] [address] [place] [address] Phone: [phone] Subjective:   Zachary Graham, am serving as a scribe for Dr. Antoine Graham.  I'm seeing this patient by the request  of:  Zachary Grandchild, MD  CC: Back and neck pain follow-up  [id number]  Zachary Graham is a 62 y.o. male coming in with complaint of back and neck pain. OMT 09/30/2022. Also f/u for R hip pain. Patient states that he has been doing well. No issues since last visit. Notes a cramp in his foot last night that woke him up.   Medications patient has been prescribed: Vit D  Taking:         Reviewed prior external information including notes and imaging from previsou exam, outside providers and external EMR if available.   As well as notes that were available from care everywhere and other healthcare systems.  Past medical history, social, surgical and family history all reviewed in electronic medical record.  No pertanent information unless stated regarding to the chief complaint.   Past Medical History:  Diagnosis Date   Diabetes mellitus    GERD (gastroesophageal reflux disease)    Heart murmur    Hyperlipidemia    Hypertension    OSA (obstructive sleep apnea)    Pneumonia     Allergies  Allergen Reactions   Lisinopril Swelling and Other (See Comments)    Extreme facial swelling causing hospitalization   Shellfish Allergy Anaphylaxis   Zetia [Ezetimibe] Other (See Comments)    ABDOMINAL CRAMPING   Dulaglutide Other (See Comments)   Gabapentin     Other reaction(s): Dizziness   Gabapentin (Once-Daily) Other (See Comments)   Iodine    Penicillins Other (See Comments)    UNSPECIFIED REACTION > Childhood allergy     Cymbalta [Duloxetine Hcl] Other (See Comments)    Severe feet cramps     Review of Systems:  No headache, visual changes, nausea, vomiting, diarrhea, constipation, dizziness, abdominal pain, skin rash, fevers, chills, night sweats,  weight loss, swollen lymph nodes, body aches, joint swelling, chest pain, shortness of breath, mood changes. POSITIVE muscle aches  Objective  Blood pressure 116/82, pulse (!) 56, height 5\' 10"  (1.778 m), weight 234 lb (106.1 kg), SpO2 98 %.   General: No apparent distress alert and oriented x3 mood and affect normal, dressed appropriately.  HEENT: Pupils equal, extraocular movements intact  Respiratory: Patient's speak in full sentences and does not appear short of breath  Cardiovascular: No lower extremity edema, non tender, no erythema  Low back does have some loss lordosis.  Still has some tenderness and atrophy noted of the gluteal area.  Osteopathic findings   T3 extended rotated and side bent right inhaled rib T9 extended rotated and side bent left inhaled. L1 flexed rotated and side bent left L3 flexed rotated and side bent right Sacrum right on right       Assessment and Plan:  Low back pain Chronic problem but stable overall.  Discussed icing regimen and home exercises, discussed which activities to do.  Continue to work on core strength.  Having some mild difficulty with his blood glucose control.  We discussed monitoring different timing as well as different foods again.  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  Follow-up with me again in 6 to 8 weeks otherwise.    Nonallopathic problems  Decision today to treat  with OMT was based on Physical Exam  After verbal consent patient was treated with HVLA, ME, FPR techniques in  rib, thoracic, lumbar, and sacral  areas  Patient tolerated the procedure well with improvement in symptoms  Patient given exercises, stretches and lifestyle modifications  See medications in patient instructions if given  Patient will follow up in 4-8 weeks      The above documentation has been reviewed and is accurate and complete Zachary Saa, DO        Note: This  dictation was prepared with Dragon dictation along with smaller phrase technology. Any transcriptional errors that result from this process are unintentional.

## 2022-11-11 ENCOUNTER — Encounter: Payer: Self-pay | Admitting: Family Medicine

## 2022-11-11 ENCOUNTER — Ambulatory Visit (INDEPENDENT_AMBULATORY_CARE_PROVIDER_SITE_OTHER): Payer: 59 | Admitting: Family Medicine

## 2022-11-11 VITALS — BP 116/82 | HR 56 | Ht 70.0 in | Wt 234.0 lb

## 2022-11-11 DIAGNOSIS — M9904 Segmental and somatic dysfunction of sacral region: Secondary | ICD-10-CM | POA: Diagnosis not present

## 2022-11-11 DIAGNOSIS — G8929 Other chronic pain: Secondary | ICD-10-CM | POA: Diagnosis not present

## 2022-11-11 DIAGNOSIS — M9901 Segmental and somatic dysfunction of cervical region: Secondary | ICD-10-CM

## 2022-11-11 DIAGNOSIS — M9902 Segmental and somatic dysfunction of thoracic region: Secondary | ICD-10-CM | POA: Diagnosis not present

## 2022-11-11 DIAGNOSIS — M9908 Segmental and somatic dysfunction of rib cage: Secondary | ICD-10-CM

## 2022-11-11 DIAGNOSIS — M9903 Segmental and somatic dysfunction of lumbar region: Secondary | ICD-10-CM | POA: Diagnosis not present

## 2022-11-11 DIAGNOSIS — M545 Low back pain, unspecified: Secondary | ICD-10-CM | POA: Diagnosis not present

## 2022-11-11 NOTE — Patient Instructions (Signed)
Always great to see you Try Gatorade Zero 2-3 hours before bed See you again in 6-8 weeks

## 2022-11-11 NOTE — Assessment & Plan Note (Signed)
Chronic problem but stable overall.  Discussed icing regimen and home exercises, discussed which activities to do.  Continue to work on core strength.  Having some mild difficulty with his blood glucose control.  We discussed monitoring different timing as well as different foods again.  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  Follow-up with me again in 6 to 8 weeks otherwise.

## 2022-11-15 ENCOUNTER — Encounter: Payer: Self-pay | Admitting: Internal Medicine

## 2022-11-20 ENCOUNTER — Other Ambulatory Visit: Payer: Self-pay | Admitting: Internal Medicine

## 2022-11-20 ENCOUNTER — Other Ambulatory Visit (HOSPITAL_COMMUNITY): Payer: Self-pay

## 2022-11-20 DIAGNOSIS — F900 Attention-deficit hyperactivity disorder, predominantly inattentive type: Secondary | ICD-10-CM

## 2022-11-20 DIAGNOSIS — E1065 Type 1 diabetes mellitus with hyperglycemia: Secondary | ICD-10-CM

## 2022-11-21 MED ORDER — AMPHETAMINE-DEXTROAMPHET ER 20 MG PO CP24
20.0000 mg | ORAL_CAPSULE | Freq: Every morning | ORAL | 0 refills | Status: DC
Start: 2022-11-21 — End: 2023-01-03
  Filled 2022-11-21: qty 30, 30d supply, fill #0

## 2022-11-22 ENCOUNTER — Other Ambulatory Visit (HOSPITAL_COMMUNITY): Payer: Self-pay

## 2022-11-22 ENCOUNTER — Other Ambulatory Visit: Payer: Self-pay

## 2022-11-22 ENCOUNTER — Encounter (HOSPITAL_BASED_OUTPATIENT_CLINIC_OR_DEPARTMENT_OTHER): Payer: Self-pay

## 2022-11-22 ENCOUNTER — Emergency Department (HOSPITAL_BASED_OUTPATIENT_CLINIC_OR_DEPARTMENT_OTHER)
Admission: EM | Admit: 2022-11-22 | Discharge: 2022-11-22 | Disposition: A | Discharge status: Home / Self Care | Payer: 59 | Attending: Emergency Medicine | Admitting: Emergency Medicine

## 2022-11-22 ENCOUNTER — Encounter: Payer: Self-pay | Admitting: Family Medicine

## 2022-11-22 ENCOUNTER — Emergency Department (HOSPITAL_BASED_OUTPATIENT_CLINIC_OR_DEPARTMENT_OTHER): Payer: 59 | Admitting: Radiology

## 2022-11-22 DIAGNOSIS — E119 Type 2 diabetes mellitus without complications: Secondary | ICD-10-CM | POA: Diagnosis not present

## 2022-11-22 DIAGNOSIS — Z043 Encounter for examination and observation following other accident: Secondary | ICD-10-CM | POA: Diagnosis not present

## 2022-11-22 DIAGNOSIS — M25562 Pain in left knee: Secondary | ICD-10-CM | POA: Diagnosis present

## 2022-11-22 DIAGNOSIS — S20212A Contusion of left front wall of thorax, initial encounter: Secondary | ICD-10-CM | POA: Insufficient documentation

## 2022-11-22 DIAGNOSIS — S8002XA Contusion of left knee, initial encounter: Secondary | ICD-10-CM | POA: Diagnosis not present

## 2022-11-22 DIAGNOSIS — S8392XA Sprain of unspecified site of left knee, initial encounter: Secondary | ICD-10-CM | POA: Diagnosis not present

## 2022-11-22 DIAGNOSIS — W010XXA Fall on same level from slipping, tripping and stumbling without subsequent striking against object, initial encounter: Secondary | ICD-10-CM | POA: Insufficient documentation

## 2022-11-22 DIAGNOSIS — W19XXXA Unspecified fall, initial encounter: Secondary | ICD-10-CM

## 2022-11-22 DIAGNOSIS — Z7984 Long term (current) use of oral hypoglycemic drugs: Secondary | ICD-10-CM | POA: Diagnosis not present

## 2022-11-22 DIAGNOSIS — Z794 Long term (current) use of insulin: Secondary | ICD-10-CM | POA: Diagnosis not present

## 2022-11-22 DIAGNOSIS — Y9301 Activity, walking, marching and hiking: Secondary | ICD-10-CM | POA: Insufficient documentation

## 2022-11-22 DIAGNOSIS — Y92093 Driveway of other non-institutional residence as the place of occurrence of the external cause: Secondary | ICD-10-CM | POA: Diagnosis not present

## 2022-11-22 DIAGNOSIS — R0781 Pleurodynia: Secondary | ICD-10-CM | POA: Diagnosis not present

## 2022-11-22 MED ORDER — OXYCODONE HCL 5 MG PO TABS
5.0000 mg | ORAL_TABLET | ORAL | 0 refills | Status: DC | PRN
  Filled 2022-11-22: qty 20, 4d supply, fill #0

## 2022-11-22 MED ORDER — HYDROCODONE-ACETAMINOPHEN 5-325 MG PO TABS: 2.0000 | ORAL_TABLET | Freq: Once | ORAL | Status: DC

## 2022-11-22 MED ORDER — OXYCODONE HCL 5 MG PO TABS
5.0000 mg | ORAL_TABLET | Freq: Once | ORAL | Status: AC
  Administered 2022-11-22: 5 mg via ORAL
  Filled 2022-11-22: qty 1

## 2022-11-22 NOTE — ED Triage Notes (Addendum)
Pt was taking trash out and fell on concrete driveway falling on stomach. C/O pain left ribs, left knee +abrasion noted on elbow and left knee -LOC did not hit head Not on any blood thinners Pt is the Psychologist, occupational here at [employer]

## 2022-11-22 NOTE — ED Notes (Signed)
Abrasion to left upper arm cleaned and bacitracin ointment placed. C/o left ribcage area pain. Pt placed on monitor. Sats 96-98%. Encouraged to deep breath. Pt. C/o of pain with movement. Dr. Judd Lien aware. No obvious bruising noted to chest area. Pt also reports left knee pain as well. Pt will remove his jeans for assessment of left knee.

## 2022-11-22 NOTE — Telephone Encounter (Signed)
Scheduled with Katrinka Blazing for 11/26/2022.

## 2022-11-22 NOTE — ED Provider Notes (Signed)
Zachary Graham EMERGENCY DEPARTMENT AT [facility] Provider Note   CSN: [id number] Arrival date & time: 11/22/22  0017     History  Chief Complaint  Patient presents with   Zachary Graham is a 62 y.o. male.  Patient is a 62 year old male with history of diabetes, hyperlipidemia, sleep apnea.  Patient presenting today for evaluation of a fall.  He was walking up his driveway after taking out the trash and tripped over an uneven section in the driveway.  This caused him to fall forward and landed on the left side of his chest.  He reports hearing a distinct crack noise and is concerned he may have broken a rib.  He also describes pain to his left knee.  There are abrasions noted to the left elbow.  No shortness of breath, but does have difficulty breathing due to pain.  The history is provided by the patient.       Home Medications Prior to Admission medications   Medication Sig Start Date End Date Taking? Authorizing Provider  Alpha-Lipoic Acid 600 MG CAPS Take 1 capsule by mouth daily.     [provider]  amphetamine-dextroamphetamine (ADDERALL XR) 20 MG 24 hr capsule Take 1 capsule by mouth in the morning. 11/21/22   [name], MD  Ascorbic Acid (VITAMIN C) 1000 MG tablet Take 1,000 mg by mouth daily.    [provider]  atorvastatin (LIPITOR) 80 MG tablet Take 1 tablet (80 mg total) by mouth daily. 04/03/22   [name], MD  azelastine (ASTELIN) 0.1 % nasal spray Place 2 sprays into both nostrils 2 (two) times daily. Use in each nostril as directed 05/08/19   [name], MD  b complex vitamins tablet Take 1 tablet by mouth daily.    [provider]  BAYER MICROLET LANCETS lancets USE TO TEST BLOOD SUGAR 6 TIMES A DAY 09/13/17   Carlus Pavlov, MD  Beclomethasone Dipropionate (QNASL) 80 MCG/ACT AERS Place 2 puffs into the nose daily as needed (allergies). 09/01/21   [name], MD  Calcium-Magnesium-Vitamin D (CALCIUM 500  PO) Take 1 tablet by mouth daily.     [provider]  clotrimazole-betamethasone (LOTRISONE) cream Apply to affected areas 2 (two) times daily. STOP USING AFTER 2 WEEKS 10/27/22   Terri Piedra, DO  Continuous Glucose Sensor (DEXCOM G7 SENSOR) MISC Apply 1 sensor every 10 days 09/24/22   Carlus Pavlov, MD  cyclobenzaprine (FLEXERIL) 10 MG tablet Take 1 tablet (10 mg total) by mouth 3 (three) times daily as needed for muscle spasms. 01/07/22   Waldon Merl, PA-C  dapagliflozin propanediol (FARXIGA) 5 MG TABS tablet Take 1 tablet (5 mg total) by mouth daily before breakfast. 03/26/22   Carlus Pavlov, MD  EPINEPHrine 0.3 mg/0.3 mL IJ SOAJ injection Inject 0.3 mg into the muscle as needed for anaphylaxis. 05/07/22   Carlus Pavlov, MD  famotidine (PEPCID) 40 MG tablet Take 1 tablet by mouth at bedtime. 08/01/20   [provider]  ferrous sulfate 325 (65 FE) MG tablet Take 1 tablet (325 mg total) by mouth daily with breakfast. 02/02/21   [name], MD  Glucagon 3 MG/DOSE POWD Place 3 mg into the nose once as needed for up to 1 dose. 05/07/22   Carlus Pavlov, MD  glucose blood (CONTOUR NEXT TEST) test strip USE AS INSTRUCTED TO CHECK SUGAR 6 TIMES DAILY 03/26/22 04/16/23  Carlus Pavlov, MD  GVOKE HYPOPEN 1-PACK  1 MG/0.2ML SOAJ Inject under skin 0.2 mL as needed for hypoglycemia 05/07/22   Carlus Pavlov, MD  hydrOXYzine (ATARAX) 10 MG tablet Take 1 tablet by mouth at bedtime as needed. 04/05/22   Judi Saa, DO  insulin lispro (HUMALOG) 100 UNIT/ML injection Inject up to 150 units into insulin pump daily. 05/07/22   Carlus Pavlov, MD  levocetirizine (XYZAL) 5 MG tablet TAKE 1 TABLET BY MOUTH IN THE EVENING 07/04/22 07/04/23  [name], MD  losartan (COZAAR) 50 MG tablet Take 1 tablet (50 mg total) by mouth daily. 09/19/22   [name], MD  Magnesium Oxide 400 MG CAPS Take 1 capsule (400 mg total) by mouth daily. 06/08/18   [name],  MD  meloxicam (MOBIC) 15 MG tablet Take 1 tablet (15 mg total) by mouth daily. 04/20/22   [name], MD  metFORMIN (GLUCOPHAGE) 500 MG tablet Take 2 tablets (1,000 mg total) by mouth 2 (two) times daily before a meal. 03/26/22 03/26/23  Carlus Pavlov, MD  metoprolol succinate (TOPROL XL) 25 MG 24 hr tablet Take 1 tablet (25 mg total) by mouth daily. Patient taking differently: Take 12.5 mg by mouth daily. 09/29/22   Nahser, Deloris Ping, MD  NON FORMULARY CPAP    [provider]  omeprazole (PRILOSEC) 40 MG capsule Take 20 mg by mouth daily. 08/01/20   [provider]  traZODone (DESYREL) 50 MG tablet TAKE 1 TABLET BY MOUTH AT BEDTIME 08/23/22 08/23/23  [name], MD  Vitamin D, Ergocalciferol, (DRISDOL) 1.25 MG (50000 UNIT) CAPS capsule TAKE 1 CAPSULE BY MOUTH EVERY 7 DAYS 09/07/22 09/07/23  Judi Saa, DO  insulin aspart (NOVOLOG) 100 UNIT/ML injection Inject max 150 units daily 03/26/22 05/07/22  Carlus Pavlov, MD      Allergies    Lisinopril, Shellfish allergy, Zetia [ezetimibe], Dulaglutide, Gabapentin, Gabapentin (once-daily), Iodine, Penicillins, and Cymbalta [duloxetine hcl]    Review of Systems   Review of Systems  All other systems reviewed and are negative.   Physical Exam Updated Vital Signs BP (!) 149/61 (BP Location: Right Arm)   Pulse 83   Temp 98 F (36.7 C) (Tympanic)   Resp 18   Ht 5\' 10"  (1.778 m)   Wt 106 kg   SpO2 98%   BMI 33.53 kg/m  Physical Exam Vitals and nursing note reviewed.  Constitutional:      General: He is not in acute distress.    Appearance: He is well-developed. He is not diaphoretic.  HENT:     Head: Normocephalic and atraumatic.  Cardiovascular:     Rate and Rhythm: Normal rate and regular rhythm.     Heart sounds: No murmur heard.    No friction rub.  Pulmonary:     Effort: Pulmonary effort is normal. No respiratory distress.     Breath sounds: Normal breath sounds. No wheezing or rales.     Comments:  There is tenderness to palpation noted to the left anterior lateral ribs.  There is no crepitus.  Breath sounds are equal bilaterally. Abdominal:     General: Bowel sounds are normal. There is no distension.     Palpations: Abdomen is soft.     Tenderness: There is no abdominal tenderness.  Musculoskeletal:        General: Normal range of motion.     Cervical back: Normal range of motion and neck supple.  Skin:    General: Skin is warm and dry.  Neurological:  Mental Status: He is alert and oriented to person, place, and time.     Coordination: Coordination normal.     ED Results / Procedures / Treatments   Labs (all labs ordered are listed, but only abnormal results are displayed) Labs Reviewed - No data to display  EKG None  Radiology No results found.  Procedures Procedures    Medications Ordered in ED Medications  HYDROcodone-acetaminophen (NORCO/VICODIN) 5-325 MG per tablet 2 tablet (has no administration in time range)    ED Course/ Medical Decision Making/ A&P  Patient is a 62 year old male presenting with complaints of a fall, the details of which are described in the HPI.  He presents with pain in his left knee and left ribs.  He arrives with stable vital signs, clear lungs, and no hypoxia.  Breath sounds are present and equal bilaterally.  X-rays of the chest and left knee are negative.  Patient to be treated for a chest wall contusion and left knee sprain.  To follow-up as needed.  Final Clinical Impression(s) / ED Diagnoses Final diagnoses:  None    Rx / DC Orders ED Discharge Orders     None         Geoffery Lyons, MD 11/22/22 [id number]

## 2022-11-22 NOTE — Discharge Instructions (Signed)
Take oxycodone as prescribed as needed for pain.  Ice for 20 minutes every 2 hours while awake for the next 2 days.  Follow-up with primary doctor if not improving in the next 1 to 2 weeks, and return to the ER if you develop worsening pain, difficulty breathing, or for other new and concerning symptoms.

## 2022-11-23 ENCOUNTER — Telehealth: Payer: Self-pay

## 2022-11-23 NOTE — Transitions of Care (Post Inpatient/ED Visit) (Unsigned)
   11/23/2022  Name: Zachary Graham MRN: [id number] DOB: [birth year]  Today's TOC FU Call Status: Today's TOC FU Call Status:: Unsuccessul Call (1st Attempt) Unsuccessful Call (1st Attempt) Date: 11/23/22  Attempted to reach the patient regarding the most recent Inpatient/ED visit.  Follow Up Plan: Additional outreach attempts will be made to reach the patient to complete the Transitions of Care (Post Inpatient/ED visit) call.   Signature  Karena Addison, LPN [facility] Nurse Health Advisor Direct Dial [phone]

## 2022-11-24 NOTE — Progress Notes (Signed)
[name] [address] [place] [address] Phone: [phone] Subjective:    I'm seeing this patient by the request  of:  [name], MD  CC: Pain after fall  [id number]  Zachary Graham is a 62 y.o. male coming in with complaint of rib pain from recent fall. (-) rib and (-) knee xray on 11/22/2022.  Patient fell initially on his left side when taking out the trash.  Patient states that any movement that he pulls with the left arm kills and he cannot take a deep breath, couch, clear throat with out any pain. Landed on his right side ribs and he thought he heard something break. Went to the ED and nothing was broken. Also hurt the left knee as well when he fell.       Past Medical History:  Diagnosis Date   Diabetes mellitus    GERD (gastroesophageal reflux disease)    Heart murmur    Hyperlipidemia    Hypertension    OSA (obstructive sleep apnea)    Pneumonia    Past Surgical History:  Procedure Laterality Date   CERVICAL DISC ARTHROPLASTY N/A 03/16/2017   Procedure: CERVICAL FIVE- CERVICAL SIX DISC ARTHROPLASTY;  Surgeon: Shirlean Kelly, MD;  Location: [facility] OR;  Service: Neurosurgery;  Laterality: N/A;  CERVICAL 5- CERVICAL 6 DISC ARTHROPLASTY   COLONOSCOPY     TONSILLECTOMY     Social History   Socioeconomic History   Marital status: Married    Spouse name: Not on file   Number of children: 3   Years of education: Not on file   Highest education level: Not on file  Occupational History   Occupation: RADIATION SAFETY OFFICER    Employer: [employer]  Tobacco Use   Smoking status: Never    Passive exposure: Never   Smokeless tobacco: Never  Vaping Use   Vaping status: Never Used  Substance and Sexual Activity   Alcohol use: No    Alcohol/week: 0.0 standard drinks of alcohol   Drug use: No   Sexual activity: Yes    Partners: Female  Other Topics Concern   Not on file  Social History Narrative   Regular exercise: runs  3 miles 3x week, takes one day off per week to rest   Caffeine use: stopped all diet soda on 04/23/15 per advice from Sports MD   Social Determinants of Health   Financial Resource Strain: Not on file  Food Insecurity: Not on file  Transportation Needs: Not on file  Physical Activity: Not on file  Stress: Not on file  Social Connections: Not on file   Allergies  Allergen Reactions   Lisinopril Swelling and Other (See Comments)    Extreme facial swelling causing hospitalization   Shellfish Allergy Anaphylaxis   Zetia [Ezetimibe] Other (See Comments)    ABDOMINAL CRAMPING   Dulaglutide Other (See Comments)   Gabapentin     Other reaction(s): Dizziness   Gabapentin (Once-Daily) Other (See Comments)   Iodine    Penicillins Other (See Comments)    UNSPECIFIED REACTION > Childhood allergy     Cymbalta [Duloxetine Hcl] Other (See Comments)    Severe feet cramps   Family History  Problem Relation Age of Onset   Colon cancer Maternal Grandfather    Colon cancer Paternal Grandfather    Heart disease Father        Valve replacement; pacemaker   Alcohol abuse Neg Hx    Asthma Neg Hx  Diabetes Neg Hx    Hyperlipidemia Neg Hx    Hypertension Neg Hx    Kidney disease Neg Hx    Stroke Neg Hx     Current Outpatient Medications (Endocrine & Metabolic):    dapagliflozin propanediol (FARXIGA) 5 MG TABS tablet, Take 1 tablet (5 mg total) by mouth daily before breakfast.   Glucagon 3 MG/DOSE POWD, Place 3 mg into the nose once as needed for up to 1 dose.   GVOKE HYPOPEN 1-PACK 1 MG/0.2ML SOAJ, Inject under skin 0.2 mL as needed for hypoglycemia   insulin lispro (HUMALOG) 100 UNIT/ML injection, Inject up to 150 units into insulin pump daily.   metFORMIN (GLUCOPHAGE) 500 MG tablet, Take 2 tablets (1,000 mg total) by mouth 2 (two) times daily before a meal.  Current Outpatient Medications (Cardiovascular):    atorvastatin (LIPITOR) 80 MG tablet, Take 1 tablet (80 mg total) by mouth  daily.   EPINEPHrine 0.3 mg/0.3 mL IJ SOAJ injection, Inject 0.3 mg into the muscle as needed for anaphylaxis.   losartan (COZAAR) 50 MG tablet, Take 1 tablet (50 mg total) by mouth daily.   metoprolol succinate (TOPROL XL) 25 MG 24 hr tablet, Take 1 tablet (25 mg total) by mouth daily. (Patient taking differently: Take 12.5 mg by mouth daily.)  Current Outpatient Medications (Respiratory):    azelastine (ASTELIN) 0.1 % nasal spray, Place 2 sprays into both nostrils 2 (two) times daily. Use in each nostril as directed   Beclomethasone Dipropionate (QNASL) 80 MCG/ACT AERS, Place 2 puffs into the nose daily as needed (allergies).   levocetirizine (XYZAL) 5 MG tablet, TAKE 1 TABLET BY MOUTH IN THE EVENING  Current Outpatient Medications (Analgesics):    meloxicam (MOBIC) 15 MG tablet, Take 1 tablet (15 mg total) by mouth daily.   oxyCODONE (OXY IR/ROXICODONE) 5 MG immediate release tablet, Take 1 tablet (5 mg total) by mouth every 4 (four) hours as needed.   traMADol (ULTRAM) 50 MG tablet, Take 1 tablet (50 mg total) by mouth every 8 (eight) hours as needed for up to 5 days.  Current Outpatient Medications (Hematological):    ferrous sulfate 325 (65 FE) MG tablet, Take 1 tablet (325 mg total) by mouth daily with breakfast.  Current Outpatient Medications (Other):    Alpha-Lipoic Acid 600 MG CAPS, Take 1 capsule by mouth daily.    amphetamine-dextroamphetamine (ADDERALL XR) 20 MG 24 hr capsule, Take 1 capsule by mouth in the morning.   Ascorbic Acid (VITAMIN C) 1000 MG tablet, Take 1,000 mg by mouth daily.   b complex vitamins tablet, Take 1 tablet by mouth daily.   BAYER MICROLET LANCETS lancets, USE TO TEST BLOOD SUGAR 6 TIMES A DAY   Calcium-Magnesium-Vitamin D (CALCIUM 500 PO), Take 1 tablet by mouth daily.    clotrimazole-betamethasone (LOTRISONE) cream, Apply to affected areas 2 (two) times daily. STOP USING AFTER 2 WEEKS   Continuous Glucose Sensor (DEXCOM G7 SENSOR) MISC, Apply 1 sensor  every 10 days   cyclobenzaprine (FLEXERIL) 10 MG tablet, Take 1 tablet (10 mg total) by mouth 3 (three) times daily as needed for muscle spasms.   cyclobenzaprine (FLEXERIL) 5 MG tablet, Take 1 tablet (5 mg total) by mouth 3 (three) times daily as needed for muscle spasms.   famotidine (PEPCID) 40 MG tablet, Take 1 tablet by mouth at bedtime.   glucose blood (CONTOUR NEXT TEST) test strip, USE AS INSTRUCTED TO CHECK SUGAR 6 TIMES DAILY   hydrOXYzine (ATARAX) 10 MG tablet, Take 1  tablet by mouth at bedtime as needed.   Magnesium Oxide 400 MG CAPS, Take 1 capsule (400 mg total) by mouth daily.   NON FORMULARY, CPAP   omeprazole (PRILOSEC) 40 MG capsule, Take 20 mg by mouth daily.   traZODone (DESYREL) 50 MG tablet, TAKE 1 TABLET BY MOUTH AT BEDTIME   Vitamin D, Ergocalciferol, (DRISDOL) 1.25 MG (50000 UNIT) CAPS capsule, TAKE 1 CAPSULE BY MOUTH EVERY 7 DAYS   Reviewed prior external information including notes and imaging from  primary care provider As well as notes that were available from care everywhere and other healthcare systems.  Past medical history, social, surgical and family history all reviewed in electronic medical record.  No pertanent information unless stated regarding to the chief complaint.   Review of Systems:  No headache, visual changes, nausea, vomiting, diarrhea, constipation, dizziness, abdominal pain, skin rash, fevers, chills, night sweats, weight loss, swollen lymph nodes, , joint swelling, chest pain, shortness of breath, mood changes. POSITIVE muscle aches, body aches  Objective  Blood pressure 136/84, pulse 72, height 5\' 10"  (1.778 m), SpO2 94%.   General: No apparent distress alert and oriented x3 mood and affect normal, dressed appropriately.  HEENT: Pupils equal, extraocular movements intact  Respiratory: Patient's speak in full sentences and does not appear short of breath  Cardiovascular: No lower extremity edema, non tender, no erythema  Back exam shows  patient is very careful at the moment.  Severe tenderness to palpation over the left rib area.  No significant crepitus.  Significant voluntary guarding noted.  Some mild involuntary guarding noted upper quadrant.  No masses palpated.  Patient is doing comfortable though to allow for palpation over the spleen at the moment.  Negative straight leg test though noted.  Neurovascularly intact distally.  Patient is extremely uncomfortable to even attempt to take a deep breath.    Impression and Recommendations:    The above documentation has been reviewed and is accurate and complete Judi Saa, DO

## 2022-11-24 NOTE — Transitions of Care (Post Inpatient/ED Visit) (Unsigned)
   11/24/2022  Name: Zachary Graham MRN: [id number] DOB: 1961/01/05  Today's TOC FU Call Status: Today's TOC FU Call Status:: Unsuccessful Call (2nd Attempt) Unsuccessful Call (1st Attempt) Date: 11/23/22 Unsuccessful Call (2nd Attempt) Date: 11/24/22  Attempted to reach the patient regarding the most recent Inpatient/ED visit.  Follow Up Plan: Additional outreach attempts will be made to reach the patient to complete the Transitions of Care (Post Inpatient/ED visit) call.   Signature Karena Addison, LPN [facility] Nurse Health Advisor Direct Dial [phone]

## 2022-11-25 NOTE — Transitions of Care (Post Inpatient/ED Visit) (Signed)
   11/25/2022  Name: Zachary Graham MRN: [id number] DOB: June 09, 1960  Today's TOC FU Call Status: Today's TOC FU Call Status:: Unsuccessful Call (3rd Attempt) Unsuccessful Call (1st Attempt) Date: 11/23/22 Unsuccessful Call (2nd Attempt) Date: 11/24/22 Unsuccessful Call (3rd Attempt) Date: 11/25/22  Attempted to reach the patient regarding the most recent Inpatient/ED visit.  Follow Up Plan: Additional outreach attempts will be made to reach the patient to complete the Transitions of Care (Post Inpatient/ED visit) call.   Signature Karena Addison, LPN [facility] Nurse Health Advisor Direct Dial [phone]

## 2022-11-26 ENCOUNTER — Ambulatory Visit (HOSPITAL_BASED_OUTPATIENT_CLINIC_OR_DEPARTMENT_OTHER)
Admission: RE | Admit: 2022-11-26 | Discharge: 2022-11-26 | Disposition: A | Discharge status: Home / Self Care | Payer: 59 | Source: Ambulatory Visit | Attending: Family Medicine | Admitting: Family Medicine

## 2022-11-26 ENCOUNTER — Encounter: Payer: Self-pay | Admitting: Family Medicine

## 2022-11-26 ENCOUNTER — Ambulatory Visit (INDEPENDENT_AMBULATORY_CARE_PROVIDER_SITE_OTHER): Payer: 59

## 2022-11-26 ENCOUNTER — Ambulatory Visit (INDEPENDENT_AMBULATORY_CARE_PROVIDER_SITE_OTHER): Payer: 59 | Admitting: Family Medicine

## 2022-11-26 ENCOUNTER — Other Ambulatory Visit (HOSPITAL_COMMUNITY): Payer: Self-pay

## 2022-11-26 VITALS — BP 136/84 | HR 72 | Ht 70.0 in

## 2022-11-26 DIAGNOSIS — S3600XA Unspecified injury of spleen, initial encounter: Secondary | ICD-10-CM | POA: Diagnosis not present

## 2022-11-26 DIAGNOSIS — M25531 Pain in right wrist: Secondary | ICD-10-CM | POA: Diagnosis not present

## 2022-11-26 DIAGNOSIS — K820 Obstruction of gallbladder: Secondary | ICD-10-CM | POA: Diagnosis not present

## 2022-11-26 DIAGNOSIS — R160 Hepatomegaly, not elsewhere classified: Secondary | ICD-10-CM | POA: Diagnosis not present

## 2022-11-26 DIAGNOSIS — S2232XA Fracture of one rib, left side, initial encounter for closed fracture: Secondary | ICD-10-CM | POA: Insufficient documentation

## 2022-11-26 DIAGNOSIS — R936 Abnormal findings on diagnostic imaging of limbs: Secondary | ICD-10-CM | POA: Diagnosis not present

## 2022-11-26 DIAGNOSIS — S3991XA Unspecified injury of abdomen, initial encounter: Secondary | ICD-10-CM | POA: Diagnosis not present

## 2022-11-26 DIAGNOSIS — Z9889 Other specified postprocedural states: Secondary | ICD-10-CM | POA: Diagnosis not present

## 2022-11-26 MED ORDER — TRAMADOL HCL 50 MG PO TABS
50.0000 mg | ORAL_TABLET | Freq: Three times a day (TID) | ORAL | 0 refills | Status: DC | PRN
  Filled 2022-11-26: qty 15, 5d supply, fill #0

## 2022-11-26 MED ORDER — CYCLOBENZAPRINE HCL 5 MG PO TABS
5.0000 mg | ORAL_TABLET | Freq: Three times a day (TID) | ORAL | 0 refills | Status: DC | PRN
  Filled 2022-11-26: qty 30, 10d supply, fill #0

## 2022-11-26 MED ORDER — GADOBUTROL 1 MMOL/ML IV SOLN
10.0000 mL | Freq: Once | INTRAVENOUS | Status: AC | PRN
  Administered 2022-11-26: 10 mL via INTRAVENOUS
  Filled 2022-11-26: qty 10

## 2022-11-26 NOTE — Assessment & Plan Note (Signed)
Right wrist does have some limited range of motion noted today.  Thumb spica splint given.  Likely an acute sprain.  Will get x-rays to further evaluate for any of the bony abnormality but I think it is highly unlikely.  Concern once again more about the abdominal pain patient is having.

## 2022-11-26 NOTE — Addendum Note (Signed)
Addended by: Debbe Odea R on: 11/26/2022 11:17 AM   Modules accepted: Orders

## 2022-11-26 NOTE — Assessment & Plan Note (Signed)
Patient has x-rays that are not showing the fracture at the moment but secondary to patient's pain and concern for this as well as with patient having abdominal pain with involuntary guarding we discussed the possibility of a spleen injury.  Will see if we can acutely get a ultrasound as well as an MRI with contrast of the abdomen to further evaluate this.  Patient knows if worsening symptoms and pain that he does need to go to the emergency room.  Patient would like to avoid any type of radiation with the CT scan if possible.  Patient given tramadol with the patient's oxycodone causing him stomach discomfort initially.  Discussed the importance of deep breaths every hour.  Discussed how to splint the area at the moment.  Follow-up again as scheduled for his other ailments.

## 2022-11-26 NOTE — Patient Instructions (Addendum)
MRI and [place] ordered Prescriptions sent in If pain worsens seek medical attention See you at next appointment

## 2022-11-27 ENCOUNTER — Other Ambulatory Visit (HOSPITAL_COMMUNITY): Payer: Self-pay

## 2022-12-06 ENCOUNTER — Other Ambulatory Visit: Payer: Self-pay | Admitting: Family Medicine

## 2022-12-06 ENCOUNTER — Other Ambulatory Visit: Payer: Self-pay | Admitting: Internal Medicine

## 2022-12-06 ENCOUNTER — Other Ambulatory Visit (HOSPITAL_COMMUNITY): Payer: Self-pay

## 2022-12-06 MED ORDER — TRAMADOL HCL 50 MG PO TABS
50.0000 mg | ORAL_TABLET | Freq: Three times a day (TID) | ORAL | 0 refills | Status: AC | PRN
  Filled 2022-12-06: qty 15, 5d supply, fill #0

## 2022-12-09 ENCOUNTER — Encounter: Payer: Self-pay | Admitting: Dermatology

## 2022-12-09 ENCOUNTER — Ambulatory Visit (INDEPENDENT_AMBULATORY_CARE_PROVIDER_SITE_OTHER): Payer: 59 | Admitting: Dermatology

## 2022-12-09 VITALS — BP 132/78

## 2022-12-09 DIAGNOSIS — W19XXXA Unspecified fall, initial encounter: Secondary | ICD-10-CM | POA: Diagnosis not present

## 2022-12-09 DIAGNOSIS — T148XXA Other injury of unspecified body region, initial encounter: Secondary | ICD-10-CM

## 2022-12-09 DIAGNOSIS — S50312A Abrasion of left elbow, initial encounter: Secondary | ICD-10-CM

## 2022-12-09 DIAGNOSIS — Q828 Other specified congenital malformations of skin: Secondary | ICD-10-CM

## 2022-12-09 DIAGNOSIS — S80212A Abrasion, left knee, initial encounter: Secondary | ICD-10-CM | POA: Diagnosis not present

## 2022-12-09 NOTE — Patient Instructions (Addendum)
Hello Zachary Graham,  Thank you for visiting our office. We appreciate your commitment to managing your health and addressing your concerns promptly.  Here are the key instructions and recommendations from your visit:  - Porokeratosis Treatment: Continue to monitor the area previously affected by porokeratosis. No further treatment is needed as the condition has resolved. You may massage Vaseline into the area to promote healing if desired.  - Care for Recent Abrasion:   - Apply Neosporin mixed with Vaseline to the affected area on your left knee to maintain a moist environment, which aids in healing.   - Cover the area with a large, flexible bandage to protect it and allow optimal healing conditions. Change the bandage every two days or if it starts to peel away.   - Continue this care routine for the next two to three weeks or until the area is fully healed.  - General Advice:   - Keep any wounds covered and moist to prevent premature scabbing and reduce the risk of scarring.   - Follow-Up: No immediate follow-up appointment is necessary. Please reach out if there are any changes or if issues arise with your healing process.  We are here to support you in your health journey. Do not hesitate to contact us if you need further assistance.  -Dr. Onalee Hua  Due to recent changes in healthcare laws, you may see results of your pathology and/or laboratory studies on MyChart before the doctors have had a chance to review them. We understand that in some cases there may be results that are confusing or concerning to you. Please understand that not all results are received at the same time and often the doctors may need to interpret multiple results in order to provide you with the best plan of care or course of treatment. Therefore, we ask that you please give [place] 2 business days to thoroughly review all your results before contacting the office for clarification. Should we see a critical lab result, you will be  contacted sooner.   If You Need Anything After Your Visit  If you have any questions or concerns for your doctor, please call our main line at [phone] If no one answers, please leave a voicemail as directed and we will return your call as soon as possible. Messages left after 4 pm will be answered the following business day.   You may also send [place] a message via MyChart. We typically respond to MyChart messages within 1-2 business days.  For prescription refills, please ask your pharmacy to contact our office. Our fax number is [phone].  If you have an urgent issue when the clinic is closed that cannot wait until the next business day, you can page your doctor at the number below.    Please note that while we do our best to be available for urgent issues outside of office hours, we are not available 24/7.   If you have an urgent issue and are unable to reach [place], you may choose to seek medical care at your doctor's office, retail clinic, urgent care center, or emergency room.  If you have a medical emergency, please immediately call 911 or go to the emergency department. In the event of inclement weather, please call our main line at [phone] for an update on the status of any delays or closures.  Dermatology Medication Tips: Please keep the boxes that topical medications come in in order to help keep track of the instructions about where and how to use  these. Pharmacies typically print the medication instructions only on the boxes and not directly on the medication tubes.   If your medication is too expensive, please contact our office at [phone] or send [place] a message through MyChart.   We are unable to tell what your co-pay for medications will be in advance as this is different depending on your insurance coverage. However, we may be able to find a substitute medication at lower cost or fill out paperwork to get insurance to cover a needed medication.   If a prior  authorization is required to get your medication covered by your insurance company, please allow [place] 1-2 business days to complete this process.  Drug prices often vary depending on where the prescription is filled and some pharmacies may offer cheaper prices.  The website www.goodrx.com contains coupons for medications through different pharmacies. The prices here do not account for what the cost may be with help from insurance (it may be cheaper with your insurance), but the website can give you the price if you did not use any insurance.  - You can print the associated coupon and take it with your prescription to the pharmacy.  - You may also stop by our office during regular business hours and pick up a GoodRx coupon card.  - If you need your prescription sent electronically to a different pharmacy, notify our office through [facility] or by phone at [phone]

## 2022-12-09 NOTE — Progress Notes (Signed)
   Follow Up Visit   Subjective  Zachary Graham is a 62 y.o. 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 He used Clortrimazole/betamethasone Cream  BID x 2 weeks then stopped. The itch is gone and condition has improved.  New complaint of abrasions post injury from a fall. He was at the ED 11/23/2022 for a cracked rib and scrapes. He has been applying Neosporin which seems to be helping.   The following portions of the chart were reviewed this encounter and updated as appropriate: medications, allergies, medical history  Review of Systems:  No other skin or systemic complaints except as noted in HPI or Assessment and Plan.  Objective  Well appearing patient in no apparent distress; mood and affect are within normal limits.       A focused examination was performed of the following areas: Right lower leg, let arm, left lower leg    Relevant exam findings are noted in the Assessment and Plan.      Assessment & Plan   PoroKeratosis-improved Exam: Pink/tan plaque with scaly center  Treatment Plan: Apply Vaseline 1-2 times daily PIH following treatment which can be treated with Vaseline 1-2 x daily  Abrasions (s/p recent fall) Exam: Healing wound with surrounding erythema and a thick adherent scab at the left knee. Erythematous patch at the left elbow. Treatment Plan: Continue Vaseline 2 x daily with non-stick guaze pad    Return if symptoms worsen or fail to improve.  Documentation: I have reviewed the above documentation for accuracy and completeness, and I agree with the above.  Jaclynn Guarneri CMA, am acting as scribe for Langston Reusing, DO.   Langston Reusing, DO

## 2022-12-15 ENCOUNTER — Other Ambulatory Visit (HOSPITAL_COMMUNITY): Payer: Self-pay

## 2022-12-16 ENCOUNTER — Other Ambulatory Visit (HOSPITAL_COMMUNITY): Payer: Self-pay

## 2022-12-16 ENCOUNTER — Encounter: Payer: Self-pay | Admitting: Family Medicine

## 2022-12-17 ENCOUNTER — Other Ambulatory Visit (HOSPITAL_COMMUNITY): Payer: Self-pay

## 2022-12-22 NOTE — Progress Notes (Unsigned)
[facility] [address] [place] [address] Phone: [phone] Subjective:   Zachary Graham, am serving as a scribe for Dr. Antoine Primas.  I'm seeing this patient by the request  of:  [name], MD  CC: Right wrist and left rib pain  [id number]  11/26/2022 Right wrist does have some limited range of motion noted today.  Thumb spica splint given.  Likely an acute sprain.  Will get x-rays to further evaluate for any of the bony abnormality but I think it is highly unlikely.  Concern once again more about the abdominal pain patient is having.      Update 12/23/2022 Zachary Graham is a 62 y.o. male coming in with complaint of R wrist pain. Patient states overall is doing better.  Still having some discomfort and pain but nothing severe.  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 No acute osseous abnormalities identified.    Past Medical History:  Diagnosis Date   Diabetes mellitus    GERD (gastroesophageal reflux disease)    Heart murmur    Hyperlipidemia    Hypertension    OSA (obstructive sleep apnea)    Pneumonia    Past Surgical History:  Procedure Laterality Date   CERVICAL DISC ARTHROPLASTY N/A 03/16/2017   Procedure: CERVICAL FIVE- CERVICAL SIX DISC ARTHROPLASTY;  Surgeon: Shirlean Kelly, MD;  Location: [facility] OR;  Service: Neurosurgery;  Laterality: N/A;  CERVICAL 5- CERVICAL 6 DISC ARTHROPLASTY   COLONOSCOPY     TONSILLECTOMY     Social History   Socioeconomic History   Marital status: Married    Spouse name: Not on file   Number of children: 3   Years of education: Not on file   Highest education level: Not on file  Occupational History   Occupation: RADIATION SAFETY OFFICER    Employer: [employer]  Tobacco Use   Smoking status: Never    Passive exposure: Never   Smokeless tobacco: Never  Vaping Use   Vaping status:  Never Used  Substance and Sexual Activity   Alcohol use: No    Alcohol/week: 0.0 standard drinks of alcohol   Drug use: No   Sexual activity: Yes    Partners: Female  Other Topics Concern   Not on file  Social History Narrative   Regular exercise: runs 3 miles 3x week, takes one day off per week to rest   Caffeine use: stopped all diet soda on 04/23/15 per advice from Sports MD   Social Determinants of Health   Financial Resource Strain: Not on file  Food Insecurity: Not on file  Transportation Needs: Not on file  Physical Activity: Not on file  Stress: Not on file  Social Connections: Not on file   Allergies  Allergen Reactions   Lisinopril Swelling and Other (See Comments)    Extreme facial swelling causing hospitalization   Shellfish Allergy Anaphylaxis   Zetia [Ezetimibe] Other (See Comments)    ABDOMINAL CRAMPING   Dulaglutide Other (See Comments)   Gabapentin     Other reaction(s): Dizziness   Gabapentin (Once-Daily) Other (See Comments)   Iodine    Penicillins Other (See Comments)    UNSPECIFIED REACTION > Childhood allergy     Cymbalta [Duloxetine Hcl] Other (See Comments)    Severe feet cramps   Family History  Problem Relation Age of Onset   Colon cancer Maternal Grandfather  Colon cancer Paternal Grandfather    Heart disease Father        Valve replacement; pacemaker   Alcohol abuse Neg Hx    Asthma Neg Hx    Diabetes Neg Hx    Hyperlipidemia Neg Hx    Hypertension Neg Hx    Kidney disease Neg Hx    Stroke Neg Hx     Current Outpatient Medications (Endocrine & Metabolic):    dapagliflozin propanediol (FARXIGA) 5 MG TABS tablet, Take 1 tablet (5 mg total) by mouth daily before breakfast.   Glucagon 3 MG/DOSE POWD, Place 3 mg into the nose once as needed for up to 1 dose.   GVOKE HYPOPEN 1-PACK 1 MG/0.2ML SOAJ, Inject under skin 0.2 mL as needed for hypoglycemia   insulin lispro (HUMALOG) 100 UNIT/ML injection, Inject up to 150 units into insulin  pump daily.   metFORMIN (GLUCOPHAGE) 500 MG tablet, Take 2 tablets (1,000 mg total) by mouth 2 (two) times daily before a meal.  Current Outpatient Medications (Cardiovascular):    atorvastatin (LIPITOR) 80 MG tablet, Take 1 tablet (80 mg total) by mouth daily.   EPINEPHrine 0.3 mg/0.3 mL IJ SOAJ injection, Inject 0.3 mg into the muscle as needed for anaphylaxis.   losartan (COZAAR) 50 MG tablet, Take 1 tablet (50 mg total) by mouth daily.   metoprolol succinate (TOPROL XL) 25 MG 24 hr tablet, Take 1 tablet (25 mg total) by mouth daily. (Patient taking differently: Take 12.5 mg by mouth daily.)  Current Outpatient Medications (Respiratory):    azelastine (ASTELIN) 0.1 % nasal spray, Place 2 sprays into both nostrils 2 (two) times daily. Use in each nostril as directed   Beclomethasone Dipropionate (QNASL) 80 MCG/ACT AERS, Place 2 puffs into the nose daily as needed (allergies).   levocetirizine (XYZAL) 5 MG tablet, TAKE 1 TABLET BY MOUTH IN THE EVENING  Current Outpatient Medications (Analgesics):    meloxicam (MOBIC) 15 MG tablet, Take 1 tablet (15 mg total) by mouth daily.   oxyCODONE (OXY IR/ROXICODONE) 5 MG immediate release tablet, Take 1 tablet (5 mg total) by mouth every 4 (four) hours as needed.  Current Outpatient Medications (Hematological):    ferrous sulfate 325 (65 FE) MG tablet, Take 1 tablet (325 mg total) by mouth daily with breakfast.  Current Outpatient Medications (Other):    Alpha-Lipoic Acid 600 MG CAPS, Take 1 capsule by mouth daily.    amphetamine-dextroamphetamine (ADDERALL XR) 20 MG 24 hr capsule, Take 1 capsule by mouth in the morning.   Ascorbic Acid (VITAMIN C) 1000 MG tablet, Take 1,000 mg by mouth daily.   b complex vitamins tablet, Take 1 tablet by mouth daily.   BAYER MICROLET LANCETS lancets, USE TO TEST BLOOD SUGAR 6 TIMES A DAY   Calcium-Magnesium-Vitamin D (CALCIUM 500 PO), Take 1 tablet by mouth daily.    clotrimazole-betamethasone (LOTRISONE) cream,  Apply to affected areas 2 (two) times daily. STOP USING AFTER 2 WEEKS   Continuous Glucose Sensor (DEXCOM G7 SENSOR) MISC, Apply 1 sensor every 10 days   cyclobenzaprine (FLEXERIL) 10 MG tablet, Take 1 tablet (10 mg total) by mouth 3 (three) times daily as needed for muscle spasms.   cyclobenzaprine (FLEXERIL) 5 MG tablet, Take 1 tablet (5 mg total) by mouth 3 (three) times daily as needed for muscle spasms.   famotidine (PEPCID) 40 MG tablet, Take 1 tablet by mouth at bedtime.   glucose blood (CONTOUR NEXT TEST) test strip, USE AS INSTRUCTED TO CHECK SUGAR 6 TIMES DAILY  hydrOXYzine (ATARAX) 10 MG tablet, Take 1 tablet by mouth at bedtime as needed.   Magnesium Oxide 400 MG CAPS, Take 1 capsule (400 mg total) by mouth daily.   NON FORMULARY, CPAP   omeprazole (PRILOSEC) 40 MG capsule, Take 20 mg by mouth daily.   traZODone (DESYREL) 50 MG tablet, TAKE 1 TABLET BY MOUTH AT BEDTIME   Vitamin D, Ergocalciferol, (DRISDOL) 1.25 MG (50000 UNIT) CAPS capsule, TAKE 1 CAPSULE BY MOUTH EVERY 7 DAYS    Objective  Blood pressure 118/76, pulse 71, height 5\' 10"  (1.778 m), weight 234 lb (106.1 kg), SpO2 96%.   General: No apparent distress alert and oriented x3 mood and affect normal, dressed appropriately.  HEENT: Pupils equal, extraocular movements intact  Respiratory: Patient's speak in full sentences and does not appear short of breath  Cardiovascular: No lower extremity edema, non tender, no erythema  Right wrist does still have some limitation of extension of the wrist compared to the contralateral side by approximately 10 degrees.  Good grip strength noted.  Neurovascularly intact.  No significant swelling noted.  Patient's left ribs still tender to palpation noted.  No crepitus noted.  No bruising noted.    Impression and Recommendations:

## 2022-12-23 ENCOUNTER — Other Ambulatory Visit: Payer: Self-pay

## 2022-12-23 ENCOUNTER — Ambulatory Visit (INDEPENDENT_AMBULATORY_CARE_PROVIDER_SITE_OTHER): Payer: 59 | Admitting: Family Medicine

## 2022-12-23 VITALS — BP 118/76 | HR 71 | Ht 70.0 in | Wt 234.0 lb

## 2022-12-23 DIAGNOSIS — M25531 Pain in right wrist: Secondary | ICD-10-CM

## 2022-12-23 NOTE — Patient Instructions (Signed)
Work on ROM but looks great Ribs are looking good See you again in 5-6 weeks

## 2022-12-23 NOTE — Assessment & Plan Note (Signed)
Patient seems to be much better at this time.  Has improvement in the range of motion.  Discussed icing regimen and home exercises, discussed which activities to do and which ones to avoid.  Increase activity slowly over the course the next several weeks.  Follow-up with me again in 6 to 8 weeks

## 2022-12-26 ENCOUNTER — Encounter: Payer: Self-pay | Admitting: Cardiovascular Disease

## 2022-12-26 NOTE — Progress Notes (Unsigned)
Cardiology Office Note:    Date:  12/27/2022   ID:  Zachary Graham, DOB Jul 05, 1960, MRN [id number]  PCP:  [name], MD   [facility] Providers Cardiologist:  Alyza Artiaga   }    Referring MD: [name], MD   Chief Complaint  Patient presents with   pulmonic valve stenosis    History of Present Illness:    Zachary Graham is a 62 y.o. male with a hx of pulmonic stenosis, HLD, HTN, DM   Has seen Dr. Jens Som in the past  Echo in 2014 showed a peak PV gradient of 16 mmHg. RV was normal  Echo June, 2022: Normal LV systolic function with EV 55-60% Normal RV size and function Peak PV gradient is 14 mmHg.  4-5 months ago, he developed some chest pressure . ? Indigestion The chest pressure is not related to exercise,  does not exercise Not position Not related to eating or drinking   Works 70-80 hrs a week. 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  Lucky if he gets 5 hours of sleep at night      Father had a valve replacement     Aug. 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  He was having lots of fatigue so we cut it in half.  He seems to be tolerating that better.  His heart rate remains high.  Will see if we can go back up to 25 mg a day.  Will have him see an APP in 3 months.  If his overall heart rate seems to be better controlled at that visit later this fall, I would like to place a 3-day event monitor to quantify his PVC burden.       Past Medical History:  Diagnosis Date   Diabetes mellitus    GERD  (gastroesophageal reflux disease)    Heart murmur    Hyperlipidemia    Hypertension    OSA (obstructive sleep apnea)    Pneumonia     Past Surgical History:  Procedure Laterality Date   CERVICAL DISC ARTHROPLASTY N/A 03/16/2017   Procedure: CERVICAL FIVE- CERVICAL SIX DISC ARTHROPLASTY;  Surgeon: Shirlean Kelly, MD;  Location: [facility] OR;  Service: Neurosurgery;  Laterality: N/A;  CERVICAL 5- CERVICAL 6 DISC ARTHROPLASTY   COLONOSCOPY     TONSILLECTOMY      Current Medications: Current Meds  Medication Sig   Alpha-Lipoic Acid 600 MG CAPS Take 1 capsule by mouth daily.    amphetamine-dextroamphetamine (ADDERALL XR) 20 MG 24 hr capsule Take 1 capsule by mouth in the morning.   Ascorbic Acid (VITAMIN C) 1000 MG tablet Take 1,000 mg by mouth daily.   aspirin EC 81 MG tablet Take by mouth daily.   atorvastatin (LIPITOR) 80 MG tablet Take 1 tablet (80 mg total) by mouth daily.   azelastine (ASTELIN) 0.1 % nasal spray Place 2 sprays into both nostrils 2 (two) times daily. Use in each  nostril as directed   b complex vitamins tablet Take 1 tablet by mouth daily.   BAYER MICROLET LANCETS lancets USE TO TEST BLOOD SUGAR 6 TIMES A DAY   Beclomethasone Dipropionate (QNASL) 80 MCG/ACT AERS Place 2 puffs into the nose daily as needed (allergies).   Calcium-Magnesium-Vitamin D (CALCIUM 500 PO) Take 1 tablet by mouth daily.    clotrimazole-betamethasone (LOTRISONE) cream Apply to affected areas 2 (two) times daily. STOP USING AFTER 2 WEEKS   Continuous Glucose Sensor (DEXCOM G7 SENSOR) MISC Apply 1 sensor every 10 days   cyclobenzaprine (FLEXERIL) 10 MG tablet Take 1 tablet (10 mg total) by mouth 3 (three) times daily as needed for muscle spasms.   dapagliflozin propanediol (FARXIGA) 5 MG TABS tablet Take 1 tablet (5 mg total) by mouth daily before breakfast.   EPINEPHrine 0.3 mg/0.3 mL IJ SOAJ injection Inject 0.3 mg into the muscle as needed for anaphylaxis.   famotidine (PEPCID) 40 MG tablet Take 1  tablet by mouth at bedtime.   ferrous sulfate 325 (65 FE) MG tablet Take 1 tablet (325 mg total) by mouth daily with breakfast.   Glucagon 3 MG/DOSE POWD Place 3 mg into the nose once as needed for up to 1 dose.   glucose blood (CONTOUR NEXT TEST) test strip USE AS INSTRUCTED TO CHECK SUGAR 6 TIMES DAILY   GVOKE HYPOPEN 1-PACK 1 MG/0.2ML SOAJ Inject under skin 0.2 mL as needed for hypoglycemia   hydrOXYzine (ATARAX) 10 MG tablet Take 1 tablet by mouth at bedtime as needed.   insulin lispro (HUMALOG) 100 UNIT/ML injection Inject up to 150 units into insulin pump daily.   levocetirizine (XYZAL) 5 MG tablet TAKE 1 TABLET BY MOUTH IN THE EVENING   losartan (COZAAR) 50 MG tablet Take 1 tablet (50 mg total) by mouth daily.   Magnesium Oxide 400 MG CAPS Take 1 capsule (400 mg total) by mouth daily.   metFORMIN (GLUCOPHAGE) 500 MG tablet Take 2 tablets (1,000 mg total) by mouth 2 (two) times daily before a meal.   metoprolol succinate (TOPROL XL) 25 MG 24 hr tablet Take 1 tablet (25 mg) by mouth daily.   NON FORMULARY CPAP   omeprazole (PRILOSEC) 40 MG capsule Take 20 mg by mouth daily.   traZODone (DESYREL) 50 MG tablet TAKE 1 TABLET BY MOUTH AT BEDTIME   [DISCONTINUED] metoprolol succinate (TOPROL XL) 25 MG 24 hr tablet Take 1 tablet (25 mg total) by mouth daily. (Patient taking differently: Take 12.5 mg by mouth daily.)     Allergies:   Lisinopril, Shellfish allergy, Zetia [ezetimibe], Dulaglutide, Gabapentin, Gabapentin (once-daily), Iodine, Penicillins, and Cymbalta [duloxetine hcl]   Social History   Socioeconomic History   Marital status: Married    Spouse name: Not on file   Number of children: 3   Years of education: Not on file   Highest education level: Not on file  Occupational History   Occupation: RADIATION SAFETY OFFICER    Employer: [employer]  Tobacco Use   Smoking status: Never    Passive exposure: Never   Smokeless tobacco: Never  Vaping Use   Vaping status: Never Used   Substance and Sexual Activity   Alcohol use: No    Alcohol/week: 0.0 standard drinks of alcohol   Drug use: No   Sexual activity: Yes    Partners: Female  Other Topics Concern   Not on file  Social History Narrative   Regular exercise: runs 3 miles 3x week, takes one day off  per week to rest   Caffeine use: stopped all diet soda on 04/23/15 per advice from Sports MD   Social Determinants of Health   Financial Resource Strain: Not on file  Food Insecurity: Not on file  Transportation Needs: Not on file  Physical Activity: Not on file  Stress: Not on file  Social Connections: Not on file     Family History: The patient's family history includes Colon cancer in his maternal grandfather and paternal grandfather; Heart disease in his father. There is no history of Alcohol abuse, Asthma, Diabetes, Hyperlipidemia, Hypertension, Kidney disease, or Stroke.  ROS:   Please see the history of present illness.     All other systems reviewed and are negative.  EKGs/Labs/Other Studies Reviewed:    The following studies were reviewed today:   EKG:           Recent Labs: 01/27/2022: ALT 20; BUN 15; Creatinine, Ser 0.92; Hemoglobin 14.0; Platelets 218.0; Potassium 4.2; Sodium 138  Recent Lipid Panel    Component Value Date/Time   CHOL 94 01/27/2022 1602   TRIG 98.0 01/27/2022 1602   HDL 40.70 01/27/2022 1602   CHOLHDL 2 01/27/2022 1602   VLDL 19.6 01/27/2022 1602   LDLCALC 34 01/27/2022 1602   LDLDIRECT 87.0 12/02/2016 1523     Risk Assessment/Calculations:              Physical Exam:    Physical Exam: Blood pressure 120/60, pulse 82, height 5' 9.5" (1.765 m), weight 236 lb (107 kg), SpO2 96%.       GEN:  moderately obese middle age male,  in no acute distress HEENT: Normal NECK: No JVD; No carotid bruits LYMPHATICS: No lymphadenopathy CARDIAC: RRR , soft systolic murmur  RESPIRATORY:  Clear to auscultation without rales, wheezing or rhonchi  ABDOMEN: Soft,  non-tender, non-distended MUSCULOSKELETAL:  No edema; No deformity  SKIN: Warm and dry NEUROLOGIC:  Alert and oriented x 3    ASSESSMENT:    1. Frequent PVCs     PLAN:      Pulmonary stenosis: He has mild to moderate pulmonic stenosis.  At this point I do not think that this is causing him any issues.  Continue to check this regularly.  2.  Chest pain:    3.  PVCs : He has a fair amount of premature ventricular contractions.  Will try increasing his Toprol-XL to see if we can suppress many of these PVCs and to slow his heart rate.  Will have him see an APP or me in 3 months.  At that time if his PVCs seem to be better controlled I would like to place a 3-day event monitor to quantify his premature ventricular contractions.            Medication Adjustments/Labs and Tests Ordered: Current medicines are reviewed at length with the patient today.  Concerns regarding medicines are outlined above.  No orders of the defined types were placed in this encounter.  Meds ordered this encounter  Medications   metoprolol succinate (TOPROL XL) 25 MG 24 hr tablet    Sig: Take 1 tablet (25 mg) by mouth daily.    Dispense:  90 tablet    Refill:  3    Dose INCREASE    Patient Instructions  Medication Instructions:  INCREASE Metoprolol Succinate/Toprol XL to 25mg  daily *If you need a refill on your cardiac medications before your next appointment, please call your pharmacy*   Lab Work: NONE If you have labs (  blood work) drawn today and your tests are completely normal, you will receive your results only by: MyChart Message (if you have MyChart) OR A paper copy in the mail If you have any lab test that is abnormal or we need to change your treatment, we will call you to review the results.   Testing/Procedures: NONE   Follow-Up: At [facility], you and your health needs are our priority.  As part of our continuing mission to provide you with exceptional heart care, we  have created designated Provider Care Teams.  These Care Teams include your primary Cardiologist (physician) and Advanced Practice Providers (APPs -  Physician Assistants and Nurse Practitioners) who all work together to provide you with the care you need, when you need it.  We recommend signing up for the patient portal called "MyChart".  Sign up information is provided on this After Visit Summary.  MyChart is used to connect with patients for Virtual Visits (Telemedicine).  Patients are able to view lab/test results, encounter notes, upcoming appointments, etc.  Non-urgent messages can be sent to your provider as well.   To learn more about what you can do with MyChart, go to [email or web address].    Your next appointment:   3 month(s)  Provider:   Venetia Constable, or Shellia Carwin, MD  12/27/2022 5:09 PM    [facility]

## 2022-12-27 ENCOUNTER — Ambulatory Visit: Payer: 59 | Attending: Cardiovascular Disease | Admitting: Cardiovascular Disease

## 2022-12-27 ENCOUNTER — Other Ambulatory Visit (HOSPITAL_COMMUNITY): Payer: Self-pay

## 2022-12-27 ENCOUNTER — Encounter: Payer: Self-pay | Admitting: Cardiovascular Disease

## 2022-12-27 VITALS — BP 120/60 | HR 82 | Ht 69.5 in | Wt 236.0 lb

## 2022-12-27 DIAGNOSIS — I493 Ventricular premature depolarization: Secondary | ICD-10-CM

## 2022-12-27 MED ORDER — METOPROLOL SUCCINATE ER 25 MG PO TB24
25.0000 mg | ORAL_TABLET | Freq: Every day | ORAL | 3 refills | Status: DC
  Filled 2022-12-27: qty 90, 90d supply, fill #0
  Filled 2023-03-28: qty 90, 90d supply, fill #1
  Filled 2023-05-23 – 2023-06-25 (×2): qty 90, 90d supply, fill #2

## 2022-12-27 NOTE — Patient Instructions (Signed)
Medication Instructions:  INCREASE Metoprolol Succinate/Toprol XL to 25mg  daily *If you need a refill on your cardiac medications before your next appointment, please call your pharmacy*   Lab Work: NONE If you have labs (blood work) drawn today and your tests are completely normal, you will receive your results only by: MyChart Message (if you have MyChart) OR A paper copy in the mail If you have any lab test that is abnormal or we need to change your treatment, we will call you to review the results.   Testing/Procedures: NONE   Follow-Up: At [facility], you and your health needs are our priority.  As part of our continuing mission to provide you with exceptional heart care, we have created designated Provider Care Teams.  These Care Teams include your primary Cardiologist (physician) and Advanced Practice Providers (APPs -  Physician Assistants and Nurse Practitioners) who all work together to provide you with the care you need, when you need it.  We recommend signing up for the patient portal called "MyChart".  Sign up information is provided on this After Visit Summary.  MyChart is used to connect with patients for Virtual Visits (Telemedicine).  Patients are able to view lab/test results, encounter notes, upcoming appointments, etc.  Non-urgent messages can be sent to your provider as well.   To learn more about what you can do with MyChart, go to [email or web address].    Your next appointment:   3 month(s)  Provider:   Venetia Constable, or [place]

## 2022-12-30 ENCOUNTER — Encounter (INDEPENDENT_AMBULATORY_CARE_PROVIDER_SITE_OTHER): Payer: Self-pay

## 2023-01-03 ENCOUNTER — Other Ambulatory Visit (HOSPITAL_COMMUNITY): Payer: Self-pay

## 2023-01-03 ENCOUNTER — Other Ambulatory Visit: Payer: Self-pay | Admitting: Internal Medicine

## 2023-01-03 DIAGNOSIS — F900 Attention-deficit hyperactivity disorder, predominantly inattentive type: Secondary | ICD-10-CM

## 2023-01-03 MED ORDER — AMPHETAMINE-DEXTROAMPHET ER 20 MG PO CP24
20.0000 mg | ORAL_CAPSULE | Freq: Every morning | ORAL | 0 refills | Status: DC
Start: 2023-01-03 — End: 2023-02-07
  Filled 2023-01-03: qty 30, 30d supply, fill #0

## 2023-01-04 ENCOUNTER — Other Ambulatory Visit: Payer: Self-pay | Admitting: Internal Medicine

## 2023-01-04 ENCOUNTER — Other Ambulatory Visit (HOSPITAL_COMMUNITY): Payer: Self-pay

## 2023-01-04 ENCOUNTER — Other Ambulatory Visit: Payer: Self-pay

## 2023-01-04 DIAGNOSIS — E785 Hyperlipidemia, unspecified: Secondary | ICD-10-CM

## 2023-01-04 MED ORDER — ATORVASTATIN CALCIUM 80 MG PO TABS
80.0000 mg | ORAL_TABLET | Freq: Every day | ORAL | 0 refills | Status: DC
Start: 2023-01-04 — End: 2023-02-04
  Filled 2023-01-04: qty 90, 90d supply, fill #0

## 2023-01-07 DIAGNOSIS — G4733 Obstructive sleep apnea (adult) (pediatric): Secondary | ICD-10-CM | POA: Diagnosis not present

## 2023-01-18 ENCOUNTER — Other Ambulatory Visit (HOSPITAL_COMMUNITY): Payer: Self-pay

## 2023-01-20 ENCOUNTER — Other Ambulatory Visit (HOSPITAL_COMMUNITY): Payer: Self-pay

## 2023-01-21 DIAGNOSIS — R682 Dry mouth, unspecified: Secondary | ICD-10-CM | POA: Diagnosis not present

## 2023-01-21 DIAGNOSIS — K219 Gastro-esophageal reflux disease without esophagitis: Secondary | ICD-10-CM | POA: Diagnosis not present

## 2023-01-21 DIAGNOSIS — J300 Vasomotor rhinitis: Secondary | ICD-10-CM | POA: Diagnosis not present

## 2023-01-21 DIAGNOSIS — Z91013 Allergy to seafood: Secondary | ICD-10-CM | POA: Diagnosis not present

## 2023-01-25 LAB — HEMOGLOBIN A1C: Hemoglobin A1C: 8

## 2023-01-26 NOTE — Progress Notes (Unsigned)
[name] [address] [place] [address] Phone: [phone] Subjective:   Zachary Graham, am serving as a scribe for Dr. Antoine Primas.  I'm seeing this patient by the request  of:  [name], MD  CC: Back pain, wrist pain follow-up  LKG:[id number]  Zachary Graham is a 62 y.o. male coming in with complaint of back and neck pain. Last seen on 8/8 for R wrist pain. Patient states that overall he is improving. Still has some pain in the wrist.  Has made improvement but still notices some limited range of motion.  Medications patient has been prescribed: None  Taking:         Reviewed prior external information including notes and imaging from previsou exam, outside providers and external EMR if available.   As well as notes that were available from care everywhere and other healthcare systems.  Past medical history, social, surgical and family history all reviewed in electronic medical record.  No pertanent information unless stated regarding to the chief complaint.   Past Medical History:  Diagnosis Date   Diabetes mellitus    GERD (gastroesophageal reflux disease)    Heart murmur    Hyperlipidemia    Hypertension    OSA (obstructive sleep apnea)    Pneumonia     Allergies  Allergen Reactions   Lisinopril Swelling and Other (See Comments)    Extreme facial swelling causing hospitalization   Shellfish Allergy Anaphylaxis   Zetia [Ezetimibe] Other (See Comments)    ABDOMINAL CRAMPING   Dulaglutide Other (See Comments)   Gabapentin     Other reaction(s): Dizziness   Gabapentin (Once-Daily) Other (See Comments)   Iodine    Penicillins Other (See Comments)    UNSPECIFIED REACTION > Childhood allergy     Cymbalta [Duloxetine Hcl] Other (See Comments)    Severe feet cramps     Review of Systems:  No headache, visual changes, nausea, vomiting, diarrhea, constipation, dizziness, abdominal pain, skin rash, fevers, chills,  night sweats, weight loss, swollen lymph nodes, body aches, joint swelling, chest pain, shortness of breath, mood changes. POSITIVE muscle aches  Objective  Blood pressure 112/72, pulse 75, height 5' 9.5" (1.765 m), weight 239 lb (108.4 kg), SpO2 97%.   General: No apparent distress alert and oriented x3 mood and affect normal, dressed appropriately.  HEENT: Pupils equal, extraocular movements intact  Respiratory: Patient's speak in full sentences and does not appear short of breath  Cardiovascular: No lower extremity edema, non tender, no erythema  Low back does have some loss of lordosis noted.  Some tenderness to palpation in the paraspinal musculature.  Patient does have tightness noted in the sacroiliac joints bilaterally.  Osteopathic findings   T5 extended rotated and side bent right inhaled rib T7 extended rotated and side bent left L2 flexed rotated and side bent right Sacrum right on right    Assessment and Plan:  Right wrist pain I do believe that there was a potential triquetrium fracture noted.  Seems to be relatively stable but could be potentially decreasing some of the range of motion.  We discussed that if worsening symptoms advanced imaging would be warranted.  Patient overall wants to continue with the conservative therapy at this time.  Increase activity slowly.  Follow-up again in 6 to 8 weeks.  Low back pain Chronic and multifactorial.  Discussed continuing to monitor.  Likely no radicular symptoms.  Responds well to osteopathic manipulation.  Increase activity otherwise  and follow-up again in 6 to 8 weeks    Nonallopathic problems  Decision today to treat with OMT was based on Physical Exam  After verbal consent patient was treated with HVLA, ME, FPR techniques in  thoracic, lumbar, and sacral  areas  Patient tolerated the procedure well with improvement in symptoms  Patient given exercises, stretches and lifestyle modifications  See medications in  patient instructions if given  Patient will follow up in 4-8 weeks      The above documentation has been reviewed and is accurate and complete Judi Saa, DO        Note: This dictation was prepared with Dragon dictation along with smaller phrase technology. Any transcriptional errors that result from this process are unintentional.

## 2023-01-27 ENCOUNTER — Ambulatory Visit (INDEPENDENT_AMBULATORY_CARE_PROVIDER_SITE_OTHER): Payer: 59 | Admitting: Family Medicine

## 2023-01-27 ENCOUNTER — Encounter: Payer: Self-pay | Admitting: Family Medicine

## 2023-01-27 VITALS — BP 112/72 | HR 75 | Ht 69.5 in | Wt 239.0 lb

## 2023-01-27 DIAGNOSIS — M9903 Segmental and somatic dysfunction of lumbar region: Secondary | ICD-10-CM

## 2023-01-27 DIAGNOSIS — M9902 Segmental and somatic dysfunction of thoracic region: Secondary | ICD-10-CM | POA: Diagnosis not present

## 2023-01-27 DIAGNOSIS — M545 Low back pain, unspecified: Secondary | ICD-10-CM | POA: Diagnosis not present

## 2023-01-27 DIAGNOSIS — G8929 Other chronic pain: Secondary | ICD-10-CM | POA: Diagnosis not present

## 2023-01-27 DIAGNOSIS — M25531 Pain in right wrist: Secondary | ICD-10-CM

## 2023-01-27 DIAGNOSIS — M9908 Segmental and somatic dysfunction of rib cage: Secondary | ICD-10-CM | POA: Diagnosis not present

## 2023-01-27 DIAGNOSIS — M9904 Segmental and somatic dysfunction of sacral region: Secondary | ICD-10-CM

## 2023-01-27 NOTE — Assessment & Plan Note (Signed)
Chronic and multifactorial.  Discussed continuing to monitor.  Likely no radicular symptoms.  Responds well to osteopathic manipulation.  Increase activity otherwise and follow-up again in 6 to 8 weeks

## 2023-01-27 NOTE — Patient Instructions (Signed)
See me in 6-8 weeks 

## 2023-01-27 NOTE — Assessment & Plan Note (Signed)
I do believe that there was a potential triquetrium fracture noted.  Seems to be relatively stable but could be potentially decreasing some of the range of motion.  We discussed that if worsening symptoms advanced imaging would be warranted.  Patient overall wants to continue with the conservative therapy at this time.  Increase activity slowly.  Follow-up again in 6 to 8 weeks.

## 2023-01-28 ENCOUNTER — Other Ambulatory Visit (HOSPITAL_COMMUNITY): Payer: Self-pay

## 2023-02-02 ENCOUNTER — Ambulatory Visit: Payer: 59 | Admitting: Internal Medicine

## 2023-02-02 ENCOUNTER — Encounter: Payer: Self-pay | Admitting: Internal Medicine

## 2023-02-02 VITALS — BP 128/80 | HR 72 | Temp 97.8°F | Resp 16 | Ht 69.5 in | Wt 238.0 lb

## 2023-02-02 DIAGNOSIS — I10 Essential (primary) hypertension: Secondary | ICD-10-CM | POA: Diagnosis not present

## 2023-02-02 DIAGNOSIS — E785 Hyperlipidemia, unspecified: Secondary | ICD-10-CM

## 2023-02-02 DIAGNOSIS — E1065 Type 1 diabetes mellitus with hyperglycemia: Secondary | ICD-10-CM

## 2023-02-02 DIAGNOSIS — Z0001 Encounter for general adult medical examination with abnormal findings: Secondary | ICD-10-CM

## 2023-02-02 DIAGNOSIS — E1142 Type 2 diabetes mellitus with diabetic polyneuropathy: Secondary | ICD-10-CM

## 2023-02-02 DIAGNOSIS — Z0000 Encounter for general adult medical examination without abnormal findings: Secondary | ICD-10-CM | POA: Diagnosis not present

## 2023-02-02 LAB — URINALYSIS, ROUTINE W REFLEX MICROSCOPIC
Bilirubin Urine: NEGATIVE
Hgb urine dipstick: NEGATIVE
Ketones, ur: NEGATIVE
Leukocytes,Ua: NEGATIVE
Nitrite: NEGATIVE
RBC / HPF: NONE SEEN (ref 0–?)
Specific Gravity, Urine: 1.015 (ref 1.000–1.030)
Total Protein, Urine: NEGATIVE
Urine Glucose: >=1000 — AB
Urobilinogen, UA: 0.2 (ref 0.0–1.0)
pH: 6 (ref 5.0–8.0)

## 2023-02-02 LAB — HEMOGLOBIN A1C: Hgb A1c MFr Bld: 8 % — ABNORMAL HIGH (ref 4.6–6.5)

## 2023-02-02 LAB — PSA: PSA: 0.25 ng/mL (ref 0.10–4.00)

## 2023-02-02 NOTE — Patient Instructions (Signed)
Health Maintenance, Male Adopting a healthy lifestyle and getting preventive care are important in promoting health and wellness. Ask your health care provider about: The right schedule for you to have regular tests and exams. Things you can do on your own to prevent diseases and keep yourself healthy. What should I know about diet, weight, and exercise? Eat a healthy diet  Eat a diet that includes plenty of vegetables, fruits, low-fat dairy products, and lean protein. Do not eat a lot of foods that are high in solid fats, added sugars, or sodium. Maintain a healthy weight Body mass index (BMI) is a measurement that can be used to identify possible weight problems. It estimates body fat based on height and weight. Your health care provider can help determine your BMI and help you achieve or maintain a healthy weight. Get regular exercise Get regular exercise. This is one of the most important things you can do for your health. Most adults should: Exercise for at least 150 minutes each week. The exercise should increase your heart rate and make you sweat (moderate-intensity exercise). Do strengthening exercises at least twice a week. This is in addition to the moderate-intensity exercise. Spend less time sitting. Even light physical activity can be beneficial. Watch cholesterol and blood lipids Have your blood tested for lipids and cholesterol at 60 years of age, then have this test every 5 years. You may need to have your cholesterol levels checked more often if: Your lipid or cholesterol levels are high. You are older than 60 years of age. You are at high risk for heart disease. What should I know about cancer screening? Many types of cancers can be detected early and may often be prevented. Depending on your health history and family history, you may need to have cancer screening at various ages. This may include screening for: Colorectal cancer. Prostate cancer. Skin cancer. Lung  cancer. What should I know about heart disease, diabetes, and high blood pressure? Blood pressure and heart disease High blood pressure causes heart disease and increases the risk of stroke. This is more likely to develop in people who have high blood pressure readings or are overweight. Talk with your health care provider about your target blood pressure readings. Have your blood pressure checked: Every 3-5 years if you are 18-39 years of age. Every year if you are 40 years old or older. If you are between the ages of 65 and 75 and are a current or former smoker, ask your health care provider if you should have a one-time screening for abdominal aortic aneurysm (AAA). Diabetes Have regular diabetes screenings. This checks your fasting blood sugar level. Have the screening done: Once every three years after age 45 if you are at a normal weight and have a low risk for diabetes. More often and at a younger age if you are overweight or have a high risk for diabetes. What should I know about preventing infection? Hepatitis B If you have a higher risk for hepatitis B, you should be screened for this virus. Talk with your health care provider to find out if you are at risk for hepatitis B infection. Hepatitis C Blood testing is recommended for: Everyone born from 1945 through 1965. Anyone with known risk factors for hepatitis C. Sexually transmitted infections (STIs) You should be screened each year for STIs, including gonorrhea and chlamydia, if: You are sexually active and are younger than 60 years of age. You are older than 60 years of age and your   health care provider tells you that you are at risk for this type of infection. Your sexual activity has changed since you were last screened, and you are at increased risk for chlamydia or gonorrhea. Ask your health care provider if you are at risk. Ask your health care provider about whether you are at high risk for HIV. Your health care provider  may recommend a prescription medicine to help prevent HIV infection. If you choose to take medicine to prevent HIV, you should first get tested for HIV. You should then be tested every 3 months for as long as you are taking the medicine. Follow these instructions at home: Alcohol use Do not drink alcohol if your health care provider tells you not to drink. If you drink alcohol: Limit how much you have to 0-2 drinks a day. Know how much alcohol is in your drink. In the [place] one drink equals one 12 oz bottle of beer (355 mL), one 5 oz glass of wine (148 mL), or one 1 oz glass of hard liquor (44 mL). Lifestyle Do not use any products that contain nicotine or tobacco. These products include cigarettes, chewing tobacco, and vaping devices, such as e-cigarettes. If you need help quitting, ask your health care provider. Do not use street drugs. Do not share needles. Ask your health care provider for help if you need support or information about quitting drugs. General instructions Schedule regular health, dental, and eye exams. Stay current with your vaccines. Tell your health care provider if: You often feel depressed. You have ever been abused or do not feel safe at home. Summary Adopting a healthy lifestyle and getting preventive care are important in promoting health and wellness. Follow your health care provider's instructions about healthy diet, exercising, and getting tested or screened for diseases. Follow your health care provider's instructions on monitoring your cholesterol and blood pressure. This information is not intended to replace advice given to you by your health care provider. Make sure you discuss any questions you have with your health care provider. Document Revised: 09/22/2020 Document Reviewed: 09/22/2020 Elsevier Patient Education  2024 [employer]  

## 2023-02-02 NOTE — Progress Notes (Signed)
Subjective:  Patient ID: Zachary Graham, male    DOB: 1960-12-29  Age: 62 y.o. MRN: [id number]  CC: Annual Exam   HPI CAMEREN BASSETTI presents for a CPX and f/up ---  He is active and denies DOE, CP, SOB, edema.     Outpatient Medications Prior to Visit  Medication Sig Dispense Refill   Alpha-Lipoic Acid 600 MG CAPS Take 1 capsule by mouth daily.      amphetamine-dextroamphetamine (ADDERALL XR) 20 MG 24 hr capsule Take 1 capsule by mouth in the morning. 30 capsule 0   Ascorbic Acid (VITAMIN C) 1000 MG tablet Take 1,000 mg by mouth daily.     aspirin EC 81 MG tablet Take by mouth daily.     azelastine (ASTELIN) 0.1 % nasal spray Place 2 sprays into both nostrils 2 (two) times daily. Use in each nostril as directed 90 mL 1   b complex vitamins tablet Take 1 tablet by mouth daily.     BAYER MICROLET LANCETS lancets USE TO TEST BLOOD SUGAR 6 TIMES A DAY 300 each 11   Beclomethasone Dipropionate (QNASL) 80 MCG/ACT AERS Place 2 puffs into the nose daily as needed (allergies). 10.6 g 5   Calcium-Magnesium-Vitamin D (CALCIUM 500 PO) Take 1 tablet by mouth daily.      clotrimazole-betamethasone (LOTRISONE) cream Apply to affected areas 2 (two) times daily. STOP USING AFTER 2 WEEKS 45 g 1   Continuous Glucose Sensor (DEXCOM G7 SENSOR) MISC Apply 1 sensor every 10 days 9 each 4   cyclobenzaprine (FLEXERIL) 10 MG tablet Take 1 tablet (10 mg total) by mouth 3 (three) times daily as needed for muscle spasms. 30 tablet 0   dapagliflozin propanediol (FARXIGA) 5 MG TABS tablet Take 1 tablet (5 mg total) by mouth daily before breakfast. 90 tablet 3   EPINEPHrine 0.3 mg/0.3 mL IJ SOAJ injection Inject 0.3 mg into the muscle as needed for anaphylaxis. 1 each PRN   famotidine (PEPCID) 40 MG tablet Take 1 tablet by mouth at bedtime.     ferrous sulfate 325 (65 FE) MG tablet Take 1 tablet (325 mg total) by mouth daily with breakfast. 90 tablet 0   Glucagon 3 MG/DOSE POWD Place 3 mg into the nose once as needed  for up to 1 dose. 1 each 11   glucose blood (CONTOUR NEXT TEST) test strip USE AS INSTRUCTED TO CHECK SUGAR 6 TIMES DAILY 400 strip 3   GVOKE HYPOPEN 1-PACK 1 MG/0.2ML SOAJ Inject under skin 0.2 mL as needed for hypoglycemia 0.2 mL PRN   hydrOXYzine (ATARAX) 10 MG tablet Take 1 tablet by mouth at bedtime as needed. 90 tablet 0   insulin lispro (HUMALOG) 100 UNIT/ML injection Inject up to 150 units into insulin pump daily. 50 mL 3   levocetirizine (XYZAL) 5 MG tablet TAKE 1 TABLET BY MOUTH IN THE EVENING 90 tablet 1   losartan (COZAAR) 50 MG tablet Take 1 tablet (50 mg total) by mouth daily. 90 tablet 1   Magnesium Oxide 400 MG CAPS Take 1 capsule (400 mg total) by mouth daily. 90 capsule 1   metFORMIN (GLUCOPHAGE) 500 MG tablet Take 2 tablets (1,000 mg total) by mouth 2 (two) times daily before a meal. 360 tablet 3   metoprolol succinate (TOPROL XL) 25 MG 24 hr tablet Take 1 tablet (25 mg) by mouth daily. 90 tablet 3   NON FORMULARY CPAP     omeprazole (PRILOSEC) 40 MG capsule Take 20 mg by mouth  daily.     traZODone (DESYREL) 50 MG tablet TAKE 1 TABLET BY MOUTH AT BEDTIME 90 tablet 1   atorvastatin (LIPITOR) 80 MG tablet Take 1 tablet (80 mg total) by mouth daily. 90 tablet 0   No facility-administered medications prior to visit.    ROS Review of Systems  Constitutional: Negative.  Negative for appetite change, diaphoresis, fatigue and fever.  HENT: Negative.    Eyes: Negative.   Respiratory: Negative.  Negative for cough, chest tightness, shortness of breath and wheezing.   Cardiovascular:  Negative for chest pain, palpitations and leg swelling.  Gastrointestinal:  Negative for abdominal pain, constipation, diarrhea, nausea and vomiting.  Endocrine: Positive for polyuria.  Genitourinary:  Positive for urgency. Negative for difficulty urinating, dysuria, frequency and hematuria.  Musculoskeletal: Negative.  Negative for arthralgias.  Skin: Negative.  Negative for color change.   Neurological: Negative.  Negative for dizziness and weakness.  Hematological:  Negative for adenopathy. Does not bruise/bleed easily.  Psychiatric/Behavioral:  Positive for decreased concentration. Negative for confusion, hallucinations, sleep disturbance and suicidal ideas. The patient is not nervous/anxious.     Objective:  BP 128/80 (BP Location: Left Arm, Patient Position: Sitting, Cuff Size: Large)   Pulse 72   Temp 97.8 F (36.6 C) (Oral)   Resp 16   Ht 5' 9.5" (1.765 m)   Wt 238 lb (108 kg)   SpO2 95%   BMI 34.64 kg/m   BP Readings from Last 3 Encounters:  02/02/23 128/80  01/27/23 112/72  12/27/22 120/60    Wt Readings from Last 3 Encounters:  02/02/23 238 lb (108 kg)  01/27/23 239 lb (108.4 kg)  12/27/22 236 lb (107 kg)    Physical Exam Vitals reviewed.  Constitutional:      Appearance: Normal appearance.  HENT:     Mouth/Throat:     Mouth: Mucous membranes are moist.  Eyes:     General: No scleral icterus.    Conjunctiva/sclera: Conjunctivae normal.  Cardiovascular:     Rate and Rhythm: Normal rate and regular rhythm.     Pulses: Normal pulses.     Heart sounds: No murmur heard.    No friction rub. No gallop.  Pulmonary:     Effort: Pulmonary effort is normal.     Breath sounds: No stridor. No wheezing, rhonchi or rales.  Abdominal:     General: Abdomen is flat. There is no distension.     Palpations: There is no mass.     Tenderness: There is no abdominal tenderness. There is no guarding.     Hernia: No hernia is present. There is no hernia in the left inguinal area or right inguinal area.  Genitourinary:    Pubic Area: No rash.      Penis: Normal and circumcised.      Testes: Normal.     Epididymis:     Right: Normal.     Left: Normal.     Prostate: Normal. Not enlarged, not tender and no nodules present.     Rectum: Normal. Guaiac result negative. No mass, tenderness, anal fissure, external hemorrhoid or internal hemorrhoid. Normal anal tone.   Musculoskeletal:        General: Normal range of motion.     Cervical back: Neck supple.     Right lower leg: No edema.     Left lower leg: No edema.  Lymphadenopathy:     Cervical: No cervical adenopathy.     Lower Body: No right inguinal adenopathy. No left  inguinal adenopathy.  Skin:    General: Skin is warm and dry.     Findings: No lesion.  Neurological:     General: No focal deficit present.     Mental Status: He is alert. Mental status is at baseline.  Psychiatric:        Mood and Affect: Mood normal.        Behavior: Behavior normal.     Lab Results  Component Value Date   WBC 8.6 10/29/2022   HGB 14.1 10/29/2022   HCT 41 10/29/2022   PLT 232 10/29/2022   GLUCOSE 138 (H) 01/27/2022   CHOL 95 10/29/2022   TRIG 99 10/29/2022   HDL 37 10/29/2022   LDLDIRECT 87.0 12/02/2016   LDLCALC 42 10/29/2022   ALT 21 10/29/2022   AST 19 10/29/2022   NA 139 10/29/2022   K 4.2 10/29/2022   CL 105 10/29/2022   CREATININE 0.8 10/29/2022   BUN 13 10/29/2022   CO2 25 (A) 10/29/2022   TSH 3.30 10/29/2022   PSA 0.25 02/02/2023   HGBA1C 8.0 (H) 02/02/2023   MICROALBUR 0.0 10/29/2022    DG Wrist Complete Right  Result Date: 12/02/2022 CLINICAL DATA:  Pain. EXAM: RIGHT WRIST - COMPLETE 4 VIEW COMPARISON:  None Available. FINDINGS: Osseous fragment that is well corticated may represent a chronic triquetral avulsion fracture. No acute fracture, dislocation or subluxation identified. No osteolytic or osteoblastic changes. Joint spaces are maintained. IMPRESSION: Osseous fragment posterior to the wrist which is consistent with an old avulsion of the triquetrum. No acute osseous abnormalities identified. Electronically Signed   By: Layla Maw M.D.   On: 12/02/2022 16:53   MR ABDOMEN WWO CONTRAST  Result Date: 11/26/2022 CLINICAL DATA:  Splenomegaly, injury of the spleen EXAM: MRI ABDOMEN WITHOUT AND WITH CONTRAST TECHNIQUE: Multiplanar multisequence MR imaging of the abdomen was  performed both before and after the administration of intravenous contrast. CONTRAST:  10mL GADAVIST GADOBUTROL 1 MMOL/ML IV SOLN COMPARISON:  None Available. FINDINGS: Lower chest: No acute abnormality. Hepatobiliary: No solid liver abnormality is seen. Hepatomegaly, maximum coronal span 23.7 cm. Contracted gallbladder. No gallstones, gallbladder wall thickening, or biliary dilatation. Pancreas: Unremarkable. No pancreatic ductal dilatation or surrounding inflammatory changes. Spleen: Normal in size without significant abnormality. No evidence of acute traumatic injury. Adrenals/Urinary Tract: Adrenal glands are unremarkable. Kidneys are normal, without renal calculi, solid lesion, or hydronephrosis. Stomach/Bowel: Stomach is within normal limits. No evidence of bowel wall thickening, distention, or inflammatory changes. Moderate burden of stool. Vascular/Lymphatic: No significant vascular findings are present. No enlarged abdominal lymph nodes. Other: No abdominal wall hernia or abnormality. No ascites. Musculoskeletal: No acute or significant osseous findings. IMPRESSION: 1. Normal size of the spleen without significant abnormality. No evidence of acute traumatic injury. 2. Hepatomegaly. Electronically Signed   By: Jearld Lesch M.D.   On: 11/26/2022 20:17   US SPLEEN (ABDOMEN LIMITED)  Result Date: 11/26/2022 CLINICAL DATA:  Status post fall with suspected injury to the spleen. EXAM: ULTRASOUND ABDOMEN LIMITED COMPARISON:  None Available. FINDINGS: The spleen measures 11.5 x 3.4 x 4.1 cm for a volume of 84.2 cm. No splenic laceration is seen. IMPRESSION: No evidence of splenomegaly or splenic laceration. Electronically Signed   By: Ted Mcalpine M.D.   On: 11/26/2022 18:56    Assessment & Plan:   Type 1 diabetes mellitus with hyperglycemia, with long-term current use of insulin (HCC)- A1C is 8.0% -     Hemoglobin A1c; Future -  HM Diabetes Foot Exam -     Urinalysis, Routine w reflex  microscopic; Future  Primary hypertension- His BP is well controlled. -     Urinalysis, Routine w reflex microscopic; Future  Encounter for general adult medical examination with abnormal findings- Exam completed, labs reviewed, vaccines reviewed, cancer screenings addressed, pt ed material was given.  -     PSA; Future  Diabetic polyneuropathy associated with type 2 diabetes mellitus (HCC) -     Urinalysis, Routine w reflex microscopic; Future  Hyperlipidemia with target LDL less than 100- LDL goal achieved. Doing well on the statin  -     Atorvastatin Calcium; Take 1 tablet (80 mg total) by mouth daily.  Dispense: 90 tablet; Refill: 1     Follow-up: Return in about 6 months (around 08/02/2023).  Sanda Linger, MD

## 2023-02-04 ENCOUNTER — Other Ambulatory Visit (HOSPITAL_COMMUNITY): Payer: Self-pay

## 2023-02-04 MED ORDER — ATORVASTATIN CALCIUM 80 MG PO TABS
80.0000 mg | ORAL_TABLET | Freq: Every day | ORAL | 1 refills | Status: DC
Start: 2023-02-04 — End: 2023-09-28
  Filled 2023-02-04 – 2023-04-01 (×2): qty 90, 90d supply, fill #0
  Filled 2023-06-30: qty 90, 90d supply, fill #1

## 2023-02-07 ENCOUNTER — Other Ambulatory Visit: Payer: Self-pay | Admitting: Internal Medicine

## 2023-02-07 ENCOUNTER — Other Ambulatory Visit (HOSPITAL_COMMUNITY): Payer: Self-pay

## 2023-02-07 DIAGNOSIS — F900 Attention-deficit hyperactivity disorder, predominantly inattentive type: Secondary | ICD-10-CM

## 2023-02-07 MED ORDER — AMPHETAMINE-DEXTROAMPHET ER 20 MG PO CP24
20.0000 mg | ORAL_CAPSULE | Freq: Every morning | ORAL | 0 refills | Status: DC
Start: 2023-02-07 — End: 2023-03-14
  Filled 2023-02-07: qty 30, 30d supply, fill #0

## 2023-02-14 ENCOUNTER — Other Ambulatory Visit (HOSPITAL_COMMUNITY): Payer: Self-pay

## 2023-02-14 ENCOUNTER — Other Ambulatory Visit: Payer: Self-pay | Admitting: Internal Medicine

## 2023-02-14 DIAGNOSIS — F320 Major depressive disorder, single episode, mild: Secondary | ICD-10-CM

## 2023-02-14 MED ORDER — TRAZODONE HCL 50 MG PO TABS
50.0000 mg | ORAL_TABLET | Freq: Every day | ORAL | 1 refills | Status: DC
Start: 2023-02-14 — End: 2023-08-13
  Filled 2023-02-14: qty 90, 90d supply, fill #0
  Filled 2023-05-05: qty 90, 90d supply, fill #1

## 2023-02-18 ENCOUNTER — Other Ambulatory Visit (HOSPITAL_COMMUNITY): Payer: Self-pay

## 2023-02-18 ENCOUNTER — Ambulatory Visit (INDEPENDENT_AMBULATORY_CARE_PROVIDER_SITE_OTHER): Payer: 59 | Admitting: Internal Medicine

## 2023-02-18 ENCOUNTER — Encounter: Payer: Self-pay | Admitting: Internal Medicine

## 2023-02-18 VITALS — BP 156/80 | HR 64 | Resp 20 | Ht 69.5 in | Wt 235.4 lb

## 2023-02-18 DIAGNOSIS — E785 Hyperlipidemia, unspecified: Secondary | ICD-10-CM

## 2023-02-18 DIAGNOSIS — E1065 Type 1 diabetes mellitus with hyperglycemia: Secondary | ICD-10-CM | POA: Diagnosis not present

## 2023-02-18 MED ORDER — DEXCOM G7 SENSOR MISC
3.0000 | 3 refills | Status: DC
  Filled 2023-02-18 – 2023-04-01 (×2): qty 9, 90d supply, fill #0
  Filled 2023-06-30: qty 9, 90d supply, fill #1
  Filled 2023-09-28: qty 9, 90d supply, fill #2
  Filled 2023-12-26: qty 9, 90d supply, fill #3

## 2023-02-18 MED ORDER — CONTOUR NEXT TEST VI STRP
ORAL_STRIP | 3 refills | Status: DC
  Filled 2023-02-18: qty 100, 30d supply, fill #0
  Filled 2023-04-01: qty 100, 30d supply, fill #1
  Filled 2023-05-05: qty 100, 30d supply, fill #2
  Filled 2023-07-28: qty 100, 30d supply, fill #3

## 2023-02-18 MED ORDER — INSULIN LISPRO 100 UNIT/ML IJ SOLN
INTRAMUSCULAR | 3 refills | Status: DC
  Filled 2023-02-18: qty 40, 28d supply, fill #0
  Filled 2023-03-14: qty 40, 28d supply, fill #1
  Filled 2023-04-11: qty 40, 28d supply, fill #2
  Filled 2023-05-05 – 2023-05-09 (×2): qty 40, 28d supply, fill #3
  Filled 2023-05-31 – 2023-12-19 (×2): qty 40, 28d supply, fill #4

## 2023-02-18 MED ORDER — METFORMIN HCL 500 MG PO TABS
1000.0000 mg | ORAL_TABLET | Freq: Two times a day (BID) | ORAL | 3 refills | Status: DC
Start: 2023-02-18 — End: 2024-02-18
  Filled 2023-02-18: qty 360, 90d supply, fill #0
  Filled 2023-05-28: qty 360, 90d supply, fill #1
  Filled 2023-08-26: qty 360, 90d supply, fill #2
  Filled 2023-11-21: qty 360, 90d supply, fill #3

## 2023-02-18 MED ORDER — DAPAGLIFLOZIN PROPANEDIOL 5 MG PO TABS
5.0000 mg | ORAL_TABLET | Freq: Every day | ORAL | 3 refills | Status: DC
  Filled 2023-02-18 – 2023-04-12 (×2): qty 90, 90d supply, fill #0
  Filled 2023-07-12: qty 90, 90d supply, fill #1
  Filled 2023-10-10: qty 90, 90d supply, fill #2
  Filled 2024-01-03: qty 90, 90d supply, fill #3

## 2023-02-18 NOTE — Patient Instructions (Addendum)
Please use the following pump settings: - basal: 12 AM: 1.15 4 AM: 1.30 6:30 AM: 1.60 8 AM: 1.90 10 AM: 1.65 12 PM: 1.45 4 PM: 1.35  9 PM: 1.55 10:30 PM: 1.45 - bolus:  - Insulin to carb ratio: 1:3, except 4-10:30 pm: 1:2.5 - Insulin sensitivity factor:  25  - target: 120-120 - Active insulin time: 3 hours  Please continue: - Metformin 1000 mg 2x with meals  - Farxiga 5 mg before breakfast  Please come back for a follow-up appointment in 4-6 months.

## 2023-02-18 NOTE — Progress Notes (Signed)
Patient ID: Zachary Graham, male   DOB: 1960/07/25, 62 y.o.   MRN: [id number]   HPI: Zachary Graham is a 62 y.o.-year-old male, presenting for f/u for DM1, dx 1993, insulin-dependent, uncontrolled, with complications (mild sensory peripheral neuropathy; hypoglycemia episodes, DR). Last visit 5 months ago.  Interim history: No increased urination, blurry vision, nausea.  He does mention easy bruising. He continues to have long hours at work and significantly increased stress and burnout.    Insulin pump: - Started in 2006 - Medtronic 670 G pump - now t:slim x2 - started beginning of 10/2021, replaced in 12/2022  CGM: -Libre CGM as a backup -now Dexcom G6 CGM  Insulin: - Prev. Humalog >> Lyumjev  (50 vials for 3 months).  He initially lost 15 pounds after switching from Humalog to Lyumjev. - before last visit, he wanted to get back to NovoLog due to Lyumjev not being approved in the pump  - then Lyumjev again, but this was causing burning at the infusion site  - then NovoLog  - now Humalog  Reviewed HbA1c levels: Lab Results  Component Value Date   HGBA1C 8.0 (H) 02/02/2023   HGBA1C 8.0 01/25/2023   HGBA1C 7.4 (A) 09/24/2022   HGBA1C 7.1 (A) 03/26/2022   HGBA1C 7.6 (H) 01/27/2022   HGBA1C 7.4 (A) 11/30/2021   HGBA1C 7.8 (A) 07/24/2021   HGBA1C 8.0 12/27/2020   HGBA1C 7.6 (A) 12/05/2020   HGBA1C 8.3 (H) 10/28/2020   HGBA1C 7.2 (A) 07/24/2020   HGBA1C 7.2 (A) 03/26/2020   HGBA1C 7.3 (A) 11/20/2019   HGBA1C 7.4 (A) 08/02/2019   HGBA1C 7.4 (A) 04/06/2019   HGBA1C 7.6 (A) 12/08/2018   HGBA1C 8.2 (H) 10/19/2018   HGBA1C 7.7 (A) 05/02/2018   HGBA1C 7.3 (A) 01/05/2018   HGBA1C 8.1 (A) 10/03/2017  07/26/2022: HbA1c 7.7% 10/2019: HbA1c calculated from fructosamine: 6.3%  Pump settings: - basal: 12 AM: 1.15 3 AM: 1.10 >> 1.15 4 AM: 1.30 6:30 AM: 1.60 8 AM: 1.90 10 AM: 1.65 12 PM: 1.45 4 PM: 1.35  8 pm: 1.60 >> 1.55 9 PM: 1.55 10:30 PM: 1.45 - bolus:  - Insulin to carb  ratio: 1:3,   (forgot) - Insulin sensitivity factor:  25  - target: 120-120 - Active insulin time: 3 hours  TDD basal: 33% >> 33% TDD bolus: 67% >> 67% TDD 122 units >> 210-240  >> 117-140 units a day - Bolus wizard: On - Changes the pump site:  every 1-2 >> every 2-3 days   He is also on:  - Metformin 500 >> 1000 mg twice a day - Actos 45 mg daily-started 12/2019 >> decreased to 22.5 mg in 01/2020 >> stopped - Farxiga 5 mg before breakfast-started 07/2021 We tried Trulicy in 09/2018 but he stopped in 02/2019 due to nausea and abdominal pain.  He checks his sugars more than 4 times a day with his Dexcom CGM:   Previously:  Prev.:   Lowest: 40 >> 40 >> 50s  >> 50. Highest: 380 >> 400 >> 300s >> 300s. He has a history of a severe hypoglycemic episode in 2016.  He has a glucagon kit at home (refilled by the [place]). No previous DKA admissions.  -No CKD, last BUN/creatinine:  Lab Results  Component Value Date   BUN 13 10/29/2022   CREATININE 0.8 10/29/2022   Lab Results  Component Value Date   MICRALBCREAT 0.0 10/29/2022   MICRALBCREAT 1.0 01/27/2022   MICRALBCREAT 0.7 10/28/2020   MICRALBCREAT 1.0  10/23/2019   MICRALBCREAT 0.8 10/19/2018   MICRALBCREAT 0.8 10/03/2017   MICRALBCREAT 0.8 03/01/2016   MICRALBCREAT 0.4 01/14/2015   MICRALBCREAT 0.4 02/05/2013   MICRALBCREAT 0.7 09/21/2011  On Cozaar.  -+ HL; last set of lipids: Lab Results  Component Value Date   CHOL 95 10/29/2022   HDL 37 10/29/2022   LDLCALC 42 10/29/2022   LDLDIRECT 87.0 12/02/2016   TRIG 99 10/29/2022   CHOLHDL 2 01/27/2022  03/19/2022: 110/141/48/34 06/23/2020: 79/74/43/21 On high-dose Lipitor.  - last eye exam was: No Retinopathy 10/06/2022 Previously + Mild NPDR. [facility].   -+ Numbness and tingling in his feet.  This has improved on the B complex.  Last foot exam-11/30/2021. B12 was normal, 855, on 01/28/2021.  He also has a history of GERD, HTN, OSA.  Latest TSH was  normal: Lab Results  Component Value Date   TSH 3.30 10/29/2022  03/19/2022: TSH 2.47 04/11/2018: TSH 3.02  He has a history of adhesive capsulitis >> improved.  Previously getting occasional steroid injections but we discussed to reduce these as much as possible.  On meloxicam.  ROS: + see HPI  I reviewed pt's medications, allergies, PMH, social hx, family hx, and changes were documented in the history of present illness. Otherwise, unchanged from my initial visit note.  Past Medical History:  Diagnosis Date   Diabetes mellitus    GERD (gastroesophageal reflux disease)    Heart murmur    Hyperlipidemia    Hypertension    OSA (obstructive sleep apnea)    Pneumonia    Past Surgical History:  Procedure Laterality Date   CERVICAL DISC ARTHROPLASTY N/A 03/16/2017   Procedure: CERVICAL FIVE- CERVICAL SIX DISC ARTHROPLASTY;  Surgeon: Shirlean Kelly, MD;  Location: [facility] OR;  Service: Neurosurgery;  Laterality: N/A;  CERVICAL 5- CERVICAL 6 DISC ARTHROPLASTY   COLONOSCOPY     TONSILLECTOMY     Social History   Socioeconomic History   Marital status: Married    Spouse name: Not on file   Number of children: 3   Years of education: Not on file   Highest education level: Not on file  Occupational History   Occupation: RADIATION SAFETY OFFICER    Employer: [employer]  Tobacco Use   Smoking status: Never    Passive exposure: Never   Smokeless tobacco: Never  Vaping Use   Vaping status: Never Used  Substance and Sexual Activity   Alcohol use: No    Alcohol/week: 0.0 standard drinks of alcohol   Drug use: No   Sexual activity: Yes    Partners: Female  Other Topics Concern   Not on file  Social History Narrative   Regular exercise: runs 3 miles 3x week, takes one day off per week to rest   Caffeine use: stopped all diet soda on 04/23/15 per advice from Sports MD   Social Determinants of Health   Financial Resource Strain: Not on file  Food Insecurity: Not on file   Transportation Needs: Not on file  Physical Activity: Not on file  Stress: Not on file  Social Connections: Not on file  Intimate Partner Violence: Not on file   Current Outpatient Medications on File Prior to Visit  Medication Sig Dispense Refill   Alpha-Lipoic Acid 600 MG CAPS Take 1 capsule by mouth daily.      amphetamine-dextroamphetamine (ADDERALL XR) 20 MG 24 hr capsule Take 1 capsule by mouth in the morning. 30 capsule 0   Ascorbic Acid (VITAMIN C) 1000 MG tablet  Take 1,000 mg by mouth daily.     aspirin EC 81 MG tablet Take by mouth daily.     atorvastatin (LIPITOR) 80 MG tablet Take 1 tablet (80 mg total) by mouth daily. 90 tablet 1   azelastine (ASTELIN) 0.1 % nasal spray Place 2 sprays into both nostrils 2 (two) times daily. Use in each nostril as directed 90 mL 1   b complex vitamins tablet Take 1 tablet by mouth daily.     BAYER MICROLET LANCETS lancets USE TO TEST BLOOD SUGAR 6 TIMES A DAY 300 each 11   Beclomethasone Dipropionate (QNASL) 80 MCG/ACT AERS Place 2 puffs into the nose daily as needed (allergies). 10.6 g 5   Calcium-Magnesium-Vitamin D (CALCIUM 500 PO) Take 1 tablet by mouth daily.      clotrimazole-betamethasone (LOTRISONE) cream Apply to affected areas 2 (two) times daily. STOP USING AFTER 2 WEEKS 45 g 1   Continuous Glucose Sensor (DEXCOM G7 SENSOR) MISC Apply 1 sensor every 10 days 9 each 4   cyclobenzaprine (FLEXERIL) 10 MG tablet Take 1 tablet (10 mg total) by mouth 3 (three) times daily as needed for muscle spasms. 30 tablet 0   dapagliflozin propanediol (FARXIGA) 5 MG TABS tablet Take 1 tablet (5 mg total) by mouth daily before breakfast. 90 tablet 3   EPINEPHrine 0.3 mg/0.3 mL IJ SOAJ injection Inject 0.3 mg into the muscle as needed for anaphylaxis. 1 each PRN   famotidine (PEPCID) 40 MG tablet Take 1 tablet by mouth at bedtime.     ferrous sulfate 325 (65 FE) MG tablet Take 1 tablet (325 mg total) by mouth daily with breakfast. 90 tablet 0   Glucagon  3 MG/DOSE POWD Place 3 mg into the nose once as needed for up to 1 dose. 1 each 11   glucose blood (CONTOUR NEXT TEST) test strip USE AS INSTRUCTED TO CHECK SUGAR 6 TIMES DAILY 400 strip 3   GVOKE HYPOPEN 1-PACK 1 MG/0.2ML SOAJ Inject under skin 0.2 mL as needed for hypoglycemia 0.2 mL PRN   hydrOXYzine (ATARAX) 10 MG tablet Take 1 tablet by mouth at bedtime as needed. 90 tablet 0   insulin lispro (HUMALOG) 100 UNIT/ML injection Inject up to 150 units into insulin pump daily. 50 mL 3   levocetirizine (XYZAL) 5 MG tablet TAKE 1 TABLET BY MOUTH IN THE EVENING 90 tablet 1   losartan (COZAAR) 50 MG tablet Take 1 tablet (50 mg total) by mouth daily. 90 tablet 1   Magnesium Oxide 400 MG CAPS Take 1 capsule (400 mg total) by mouth daily. 90 capsule 1   metFORMIN (GLUCOPHAGE) 500 MG tablet Take 2 tablets (1,000 mg total) by mouth 2 (two) times daily before a meal. 360 tablet 3   metoprolol succinate (TOPROL XL) 25 MG 24 hr tablet Take 1 tablet (25 mg) by mouth daily. 90 tablet 3   NON FORMULARY CPAP     omeprazole (PRILOSEC) 40 MG capsule Take 20 mg by mouth daily.     traZODone (DESYREL) 50 MG tablet Take 1 tablet (50 mg total) by mouth at bedtime. 90 tablet 1   [DISCONTINUED] insulin aspart (NOVOLOG) 100 UNIT/ML injection Inject max 150 units daily 50 mL 3   No current facility-administered medications on file prior to visit.   Allergies  Allergen Reactions   Lisinopril Swelling and Other (See Comments)    Extreme facial swelling causing hospitalization   Shellfish Allergy Anaphylaxis   Zetia [Ezetimibe] Other (See Comments)  ABDOMINAL CRAMPING   Dulaglutide Other (See Comments)   Gabapentin     Other reaction(s): Dizziness   Gabapentin (Once-Daily) Other (See Comments)   Iodine    Penicillins Other (See Comments)    UNSPECIFIED REACTION > Childhood allergy     Cymbalta [Duloxetine Hcl] Other (See Comments)    Severe feet cramps   Family History  Problem Relation Age of Onset   Colon  cancer Maternal Grandfather    Colon cancer Paternal Grandfather    Heart disease Father        Valve replacement; pacemaker   Alcohol abuse Neg Hx    Asthma Neg Hx    Diabetes Neg Hx    Hyperlipidemia Neg Hx    Hypertension Neg Hx    Kidney disease Neg Hx    Stroke Neg Hx    PE: BP (!) 156/80 (BP Location: Left Arm, Patient Position: Sitting, Cuff Size: Normal)   Pulse 64   Resp 20   Ht 5' 9.5" (1.765 m)   Wt 235 lb 6.4 oz (106.8 kg)   SpO2 99%   BMI 34.26 kg/m    Wt Readings from Last 10 Encounters:  02/18/23 235 lb 6.4 oz (106.8 kg)  02/02/23 238 lb (108 kg)  01/27/23 239 lb (108.4 kg)  12/27/22 236 lb (107 kg)  12/23/22 234 lb (106.1 kg)  11/22/22 233 lb 11 oz (106 kg)  11/11/22 234 lb (106.1 kg)  09/30/22 238 lb (108 kg)  09/29/22 240 lb 6.4 oz (109 kg)  09/24/22 237 lb 9.6 oz (107.8 kg)   Constitutional: overweight, in NAD Eyes: EOMI, no exophthalmos ENT:  no thyromegaly, no cervical lymphadenopathy Cardiovascular: RRR, No MRG Respiratory: CTA B Musculoskeletal: no deformities Skin: moist, warm, no rashes Neurological: no tremor with outstretched hands Diabetic Foot Exam - Simple   Simple Foot Form Diabetic Foot exam was performed with the following findings: Yes 02/18/2023  4:11 PM  Visual Inspection No deformities, no ulcerations, no other skin breakdown bilaterally: Yes Sensation Testing Intact to touch and monofilament testing bilaterally: Yes Pulse Check Posterior Tibialis and Dorsalis pulse intact bilaterally: Yes Comments    ASSESSMENT: 1. DM1, insulin-dependent, uncontrolled, without complications - DR - PN  - Barriers to good control: Very busy schedule >> less time to check sugars and eat but But he does a great job with bolusing during the day >> still has a lot of large boluses during the day, sometimes without documented carbs or sugars Lack of sleep >> usually sleeps max 5h a night (!) and tries to catch up in the weekend Reaches home  late at night  - sometimes at 1 am >> eats >65% of the daily calories then  2. HL  PLAN:  1. Patient with longstanding, uncontrolled, type 1 diabetes, with high insulin resistance, managed on an insulin pump.  He was previously on the Medtronic insulin pump but currently on the X2 insulin pump integrated with the Dexcom CGM.  He is also on oral medication with metformin and SGLT2 inhibitor.  He was previously on Actos, which was stopped.  At last visit, HbA1c was 7.4%, but he had another HbA1c obtained last month and this was higher, at 8.0%. -At last visit, sugars were fluctuating within the target range but increasing above target around 3 to 4 PM and staying elevated until the next morning.  Upon reviewing his pump settings, he forgot to strengthen his insulin to carb ratio for lunch.  We discussed about doing so but  otherwise we did not change his regimen. CGM interpretation: -At today's visit, we reviewed his CGM downloads: It appears that 75% of values are in target range (goal >70%), while 24% are higher than 180 (goal <25%), and 0% are lower than 70 (goal <4%).  The calculated average blood sugar is 155.  The projected HbA1c for the next 3 months (GMI) is 7.0%. -Reviewing the CGM trends, sugars appear to be fluctuating mostly within the target range, but they do increase approximately after 6 PM and they improved in the second half of the night.  Upon reviewing his pump settings, his insulin to carb ratios are not stronger in the afternoon, as discussed at last visit so at today's visit I advised him to try to strengthen them with dinner.  I feel that the ICR's with lunch are adequate so I suggested to change these starting at 4 PM.  No other changes are needed for now, especially if the sugars appear to be improved compared to the latest HbA1c. - I advised him to:  Patient Instructions  Please use the following pump settings: - basal: 12 AM: 1.15 4 AM: 1.30 6:30 AM: 1.60 8 AM: 1.90 10 AM:  1.65 12 PM: 1.45 4 PM: 1.35  9 PM: 1.55 10:30 PM: 1.45 - bolus:  - Insulin to carb ratio: 1:3, except 4-10:30 pm: 1:2.5 - Insulin sensitivity factor:  25  - target: 120-120 - Active insulin time: 3 hours  Please continue: - Metformin 1000 mg 2x with meals  - Farxiga 5 mg before breakfast  Please come back for a follow-up appointment in 4-6 months.  - advised to check sugars at different times of the day - 4x a day, rotating check times - advised for yearly eye exams >> he is UTD - return to clinic in 4-6 months  2. HL - Reviewed latest lipid panel from 3.5 months ago: All fractions at goal: Lab Results  Component Value Date   CHOL 95 10/29/2022   HDL 37 10/29/2022   LDLCALC 42 10/29/2022   LDLDIRECT 87.0 12/02/2016   TRIG 99 10/29/2022   CHOLHDL 2 01/27/2022  - Continues Lipitor 80 mg daily without side effects  Carlus Pavlov, MD PhD [facility]

## 2023-02-19 ENCOUNTER — Other Ambulatory Visit (HOSPITAL_COMMUNITY): Payer: Self-pay

## 2023-02-21 ENCOUNTER — Other Ambulatory Visit (HOSPITAL_COMMUNITY): Payer: Self-pay

## 2023-02-28 ENCOUNTER — Other Ambulatory Visit (HOSPITAL_COMMUNITY): Payer: Self-pay

## 2023-02-28 ENCOUNTER — Other Ambulatory Visit: Payer: Self-pay | Admitting: Internal Medicine

## 2023-02-28 DIAGNOSIS — J301 Allergic rhinitis due to pollen: Secondary | ICD-10-CM

## 2023-02-28 MED ORDER — LEVOCETIRIZINE DIHYDROCHLORIDE 5 MG PO TABS
ORAL_TABLET | Freq: Every evening | ORAL | 1 refills | Status: DC
Start: 2023-02-28 — End: 2023-08-26
  Filled 2023-02-28: qty 90, 90d supply, fill #0
  Filled 2023-05-28: qty 90, 90d supply, fill #1

## 2023-03-03 ENCOUNTER — Encounter: Payer: Self-pay | Admitting: General Practice

## 2023-03-09 NOTE — Progress Notes (Unsigned)
[name] [address] [place] [address] Phone: [phone] Subjective:   Zachary Graham, am serving as a scribe for Dr. Antoine Primas.  I'm seeing this patient by the request  of:  [name], MD  CC: low back pain follow up   [id number]  01/27/2023 I do believe that there was a potential triquetrium fracture noted.  Seems to be relatively stable but could be potentially decreasing some of the range of motion.  We discussed that if worsening symptoms advanced imaging would be warranted.  Patient overall wants to continue with the conservative therapy at this time.  Increase activity slowly.  Follow-up again in 6 to 8 weeks.      Update 03/10/2023 Zachary Graham is a 62 y.o. male coming in with complaint of R wrist pain and LBP. OMT also performed last visit. Patient states        Past Medical History:  Diagnosis Date   Diabetes mellitus    GERD (gastroesophageal reflux disease)    Heart murmur    Hyperlipidemia    Hypertension    OSA (obstructive sleep apnea)    Pneumonia    Past Surgical History:  Procedure Laterality Date   CERVICAL DISC ARTHROPLASTY N/A 03/16/2017   Procedure: CERVICAL FIVE- CERVICAL SIX DISC ARTHROPLASTY;  Surgeon: Shirlean Kelly, MD;  Location: [facility] OR;  Service: Neurosurgery;  Laterality: N/A;  CERVICAL 5- CERVICAL 6 DISC ARTHROPLASTY   COLONOSCOPY     TONSILLECTOMY     Social History   Socioeconomic History   Marital status: Married    Spouse name: Not on file   Number of children: 3   Years of education: Not on file   Highest education level: Not on file  Occupational History   Occupation: RADIATION SAFETY OFFICER    Employer: [employer]  Tobacco Use   Smoking status: Never    Passive exposure: Never   Smokeless tobacco: Never  Vaping Use   Vaping status: Never Used  Substance and Sexual Activity   Alcohol use: No    Alcohol/week: 0.0 standard drinks of alcohol   Drug use: No    Sexual activity: Yes    Partners: Female  Other Topics Concern   Not on file  Social History Narrative   Regular exercise: runs 3 miles 3x week, takes one day off per week to rest   Caffeine use: stopped all diet soda on 04/23/15 per advice from Sports MD   Social Determinants of Health   Financial Resource Strain: Not on file  Food Insecurity: Not on file  Transportation Needs: Not on file  Physical Activity: Not on file  Stress: Not on file  Social Connections: Not on file   Allergies  Allergen Reactions   Lisinopril Swelling and Other (See Comments)    Extreme facial swelling causing hospitalization   Shellfish Allergy Anaphylaxis   Zetia [Ezetimibe] Other (See Comments)    ABDOMINAL CRAMPING   Dulaglutide Other (See Comments)   Gabapentin     Other reaction(s): Dizziness   Gabapentin (Once-Daily) Other (See Comments)   Iodine    Penicillins Other (See Comments)    UNSPECIFIED REACTION > Childhood allergy     Cymbalta [Duloxetine Hcl] Other (See Comments)    Severe feet cramps   Family History  Problem Relation Age of Onset   Colon cancer Maternal Grandfather    Colon cancer Paternal Grandfather    Heart disease Father  Valve replacement; pacemaker   Alcohol abuse Neg Hx    Asthma Neg Hx    Diabetes Neg Hx    Hyperlipidemia Neg Hx    Hypertension Neg Hx    Kidney disease Neg Hx    Stroke Neg Hx     Current Outpatient Medications (Endocrine & Metabolic):    dapagliflozin propanediol (FARXIGA) 5 MG TABS tablet, Take 1 tablet (5 mg total) by mouth daily before breakfast.   Glucagon 3 MG/DOSE POWD, Place 3 mg into the nose once as needed for up to 1 dose.   GVOKE HYPOPEN 1-PACK 1 MG/0.2ML SOAJ, Inject under skin 0.2 mL as needed for hypoglycemia   insulin lispro (HUMALOG) 100 UNIT/ML injection, Inject up to 150 units into insulin pump daily.   metFORMIN (GLUCOPHAGE) 500 MG tablet, Take 2 tablets (1,000 mg total) by mouth 2 (two) times daily before a  meal.  Current Outpatient Medications (Cardiovascular):    atorvastatin (LIPITOR) 80 MG tablet, Take 1 tablet (80 mg total) by mouth daily.   EPINEPHrine 0.3 mg/0.3 mL IJ SOAJ injection, Inject 0.3 mg into the muscle as needed for anaphylaxis.   losartan (COZAAR) 50 MG tablet, Take 1 tablet (50 mg total) by mouth daily.   metoprolol succinate (TOPROL XL) 25 MG 24 hr tablet, Take 1 tablet (25 mg) by mouth daily.  Current Outpatient Medications (Respiratory):    azelastine (ASTELIN) 0.1 % nasal spray, Place 2 sprays into both nostrils 2 (two) times daily. Use in each nostril as directed   Beclomethasone Dipropionate (QNASL) 80 MCG/ACT AERS, Place 2 puffs into the nose daily as needed (allergies).   levocetirizine (XYZAL) 5 MG tablet, TAKE 1 TABLET BY MOUTH IN THE EVENING  Current Outpatient Medications (Analgesics):    aspirin EC 81 MG tablet, Take by mouth daily.  Current Outpatient Medications (Hematological):    ferrous sulfate 325 (65 FE) MG tablet, Take 1 tablet (325 mg total) by mouth daily with breakfast.  Current Outpatient Medications (Other):    Alpha-Lipoic Acid 600 MG CAPS, Take 1 capsule by mouth daily.    amphetamine-dextroamphetamine (ADDERALL XR) 20 MG 24 hr capsule, Take 1 capsule by mouth in the morning.   Ascorbic Acid (VITAMIN C) 1000 MG tablet, Take 1,000 mg by mouth daily.   b complex vitamins tablet, Take 1 tablet by mouth daily.   BAYER MICROLET LANCETS lancets, USE TO TEST BLOOD SUGAR 6 TIMES A DAY   Calcium-Magnesium-Vitamin D (CALCIUM 500 PO), Take 1 tablet by mouth daily.    clotrimazole-betamethasone (LOTRISONE) cream, Apply to affected areas 2 (two) times daily. STOP USING AFTER 2 WEEKS   Continuous Glucose Sensor (DEXCOM G7 SENSOR) MISC, Apply 1 sensor every 10 days   cyclobenzaprine (FLEXERIL) 10 MG tablet, Take 1 tablet (10 mg total) by mouth 3 (three) times daily as needed for muscle spasms.   famotidine (PEPCID) 40 MG tablet, Take 1 tablet by mouth at  bedtime.   glucose blood (CONTOUR NEXT TEST) test strip, USE AS INSTRUCTED TO CHECK SUGAR 3 TIMES DAILY   hydrOXYzine (ATARAX) 10 MG tablet, Take 1 tablet by mouth at bedtime as needed.   Magnesium Oxide 400 MG CAPS, Take 1 capsule (400 mg total) by mouth daily.   NON FORMULARY, CPAP   omeprazole (PRILOSEC) 40 MG capsule, Take 20 mg by mouth daily.   traZODone (DESYREL) 50 MG tablet, Take 1 tablet (50 mg total) by mouth at bedtime.   Reviewed prior external information including notes and imaging from  primary  care provider As well as notes that were available from care everywhere and other healthcare systems.  Past medical history, social, surgical and family history all reviewed in electronic medical record.  No pertanent information unless stated regarding to the chief complaint.   Review of Systems:  No headache, visual changes, nausea, vomiting, diarrhea, constipation, dizziness, abdominal pain, skin rash, fevers, chills, night sweats, weight loss, swollen lymph nodes, body aches, joint swelling, chest pain, shortness of breath, mood changes. POSITIVE muscle aches  Objective  Blood pressure (!) 124/90, pulse 78, height 5' 9.5" (1.765 m), weight 235 lb (106.6 kg), SpO2 98%.   General: No apparent distress alert and oriented x3 mood and affect normal, dressed appropriately.  HEENT: Pupils equal, extraocular movements intact  Respiratory: Patient's speak in full sentences and does not appear short of breath  Cardiovascular: No lower extremity edema, non tender, no erythema  Low back does have significant tightness.  Patient does appear to be anxious.  Mild positive FABER test bilaterally.    Osteopathic findings  T5 extended rotated and side bent right inhaled third rib T8 extended rotated and side bent left L1 flexed rotated and side bent right Sacrum right on right    Impression and Recommendations:     The above documentation has been reviewed and is accurate and complete  Judi Saa, DO  Low back pain Multifactorial.  Seems to have some increasing in discomfort but has been sitting a lot more.  Discussed with patient about core strengthening.  Patient will start to try to increase activity but at the moment unfortunately is having to work significantly more.  Discussed icing regimen exercises.  Discussed which activities to do weight.  Slowly.  Follow-up 6 to 8 weeks   Decision today to treat with OMT was based on Physical Exam  After verbal consent patient was treated with HVLA, ME, FPR techniques in cervical, thoracic, rib, lumbar and sacral areas, all areas are chronic   Patient tolerated the procedure well with improvement in symptoms  Patient given exercises, stretches and lifestyle modifications  See medications in patient instructions if given  Patient will follow up in 4-8 weeks

## 2023-03-10 ENCOUNTER — Ambulatory Visit: Payer: 59 | Admitting: Family Medicine

## 2023-03-10 ENCOUNTER — Encounter: Payer: Self-pay | Admitting: Family Medicine

## 2023-03-10 VITALS — BP 124/90 | HR 78 | Ht 69.5 in | Wt 235.0 lb

## 2023-03-10 DIAGNOSIS — M9901 Segmental and somatic dysfunction of cervical region: Secondary | ICD-10-CM | POA: Diagnosis not present

## 2023-03-10 DIAGNOSIS — M9903 Segmental and somatic dysfunction of lumbar region: Secondary | ICD-10-CM

## 2023-03-10 DIAGNOSIS — M9902 Segmental and somatic dysfunction of thoracic region: Secondary | ICD-10-CM

## 2023-03-10 DIAGNOSIS — M9904 Segmental and somatic dysfunction of sacral region: Secondary | ICD-10-CM | POA: Diagnosis not present

## 2023-03-10 DIAGNOSIS — M545 Low back pain, unspecified: Secondary | ICD-10-CM

## 2023-03-10 DIAGNOSIS — M9908 Segmental and somatic dysfunction of rib cage: Secondary | ICD-10-CM

## 2023-03-10 DIAGNOSIS — G8929 Other chronic pain: Secondary | ICD-10-CM | POA: Diagnosis not present

## 2023-03-10 NOTE — Patient Instructions (Signed)
Good to see you Find time for yourself Take a break every 2 hours See me again in 2 months

## 2023-03-10 NOTE — Assessment & Plan Note (Signed)
Multifactorial.  Seems to have some increasing in discomfort but has been sitting a lot more.  Discussed with patient about core strengthening.  Patient will start to try to increase activity but at the moment unfortunately is having to work significantly more.  Discussed icing regimen exercises.  Discussed which activities to do weight.  Slowly.  Follow-up 6 to 8 weeks

## 2023-03-11 ENCOUNTER — Other Ambulatory Visit (HOSPITAL_COMMUNITY): Payer: Self-pay

## 2023-03-14 ENCOUNTER — Encounter: Payer: Self-pay | Admitting: Internal Medicine

## 2023-03-14 ENCOUNTER — Other Ambulatory Visit: Payer: Self-pay | Admitting: Internal Medicine

## 2023-03-14 ENCOUNTER — Other Ambulatory Visit: Payer: Self-pay

## 2023-03-14 ENCOUNTER — Other Ambulatory Visit (HOSPITAL_COMMUNITY): Payer: Self-pay

## 2023-03-14 DIAGNOSIS — E139 Other specified diabetes mellitus without complications: Secondary | ICD-10-CM

## 2023-03-14 DIAGNOSIS — E1065 Type 1 diabetes mellitus with hyperglycemia: Secondary | ICD-10-CM

## 2023-03-14 DIAGNOSIS — I10 Essential (primary) hypertension: Secondary | ICD-10-CM

## 2023-03-14 DIAGNOSIS — F900 Attention-deficit hyperactivity disorder, predominantly inattentive type: Secondary | ICD-10-CM

## 2023-03-14 MED ORDER — AMPHETAMINE-DEXTROAMPHET ER 20 MG PO CP24
20.0000 mg | ORAL_CAPSULE | Freq: Every morning | ORAL | 0 refills | Status: DC
  Filled 2023-03-14: qty 30, 30d supply, fill #0

## 2023-03-14 MED ORDER — LOSARTAN POTASSIUM 50 MG PO TABS
50.0000 mg | ORAL_TABLET | Freq: Every day | ORAL | 1 refills | Status: DC
Start: 2023-03-14 — End: 2023-09-07
  Filled 2023-03-14: qty 90, 90d supply, fill #0
  Filled 2023-06-11: qty 90, 90d supply, fill #1

## 2023-03-15 ENCOUNTER — Other Ambulatory Visit (HOSPITAL_COMMUNITY): Payer: Self-pay

## 2023-03-28 ENCOUNTER — Other Ambulatory Visit (HOSPITAL_COMMUNITY): Payer: Self-pay

## 2023-03-29 ENCOUNTER — Ambulatory Visit: Payer: 59 | Admitting: Pulmonary Disease

## 2023-04-01 ENCOUNTER — Other Ambulatory Visit (HOSPITAL_COMMUNITY): Payer: Self-pay

## 2023-04-01 ENCOUNTER — Other Ambulatory Visit: Payer: Self-pay

## 2023-04-02 ENCOUNTER — Other Ambulatory Visit (HOSPITAL_COMMUNITY): Payer: Self-pay

## 2023-04-07 ENCOUNTER — Other Ambulatory Visit (HOSPITAL_COMMUNITY): Payer: Self-pay

## 2023-04-08 ENCOUNTER — Ambulatory Visit: Payer: 59 | Attending: Pulmonary Disease | Admitting: Pulmonary Disease

## 2023-04-11 ENCOUNTER — Other Ambulatory Visit: Payer: Self-pay | Admitting: Internal Medicine

## 2023-04-11 ENCOUNTER — Other Ambulatory Visit (HOSPITAL_COMMUNITY): Payer: Self-pay

## 2023-04-11 DIAGNOSIS — F900 Attention-deficit hyperactivity disorder, predominantly inattentive type: Secondary | ICD-10-CM

## 2023-04-12 ENCOUNTER — Other Ambulatory Visit (HOSPITAL_COMMUNITY): Payer: Self-pay

## 2023-04-12 MED ORDER — AMPHETAMINE-DEXTROAMPHET ER 20 MG PO CP24
20.0000 mg | ORAL_CAPSULE | Freq: Every morning | ORAL | 0 refills | Status: DC
  Filled 2023-04-12: qty 30, 30d supply, fill #0

## 2023-04-18 ENCOUNTER — Encounter: Payer: Self-pay | Admitting: Internal Medicine

## 2023-04-20 ENCOUNTER — Telehealth: Payer: Self-pay

## 2023-04-20 NOTE — Telephone Encounter (Signed)
Sample  Device/Supplies: 3 mL Cartridges and syringes Quantity:3  Dicie Beam

## 2023-04-21 ENCOUNTER — Ambulatory Visit: Payer: 59 | Admitting: Pulmonary Disease

## 2023-04-28 DIAGNOSIS — Z23 Encounter for immunization: Secondary | ICD-10-CM | POA: Diagnosis not present

## 2023-04-28 DIAGNOSIS — M7732 Calcaneal spur, left foot: Secondary | ICD-10-CM | POA: Diagnosis not present

## 2023-04-28 DIAGNOSIS — R002 Palpitations: Secondary | ICD-10-CM | POA: Diagnosis not present

## 2023-04-28 DIAGNOSIS — M79672 Pain in left foot: Secondary | ICD-10-CM | POA: Diagnosis not present

## 2023-04-28 DIAGNOSIS — M21612 Bunion of left foot: Secondary | ICD-10-CM | POA: Diagnosis not present

## 2023-04-28 DIAGNOSIS — K219 Gastro-esophageal reflux disease without esophagitis: Secondary | ICD-10-CM | POA: Diagnosis not present

## 2023-04-28 DIAGNOSIS — M19072 Primary osteoarthritis, left ankle and foot: Secondary | ICD-10-CM | POA: Diagnosis not present

## 2023-04-28 DIAGNOSIS — J310 Chronic rhinitis: Secondary | ICD-10-CM | POA: Diagnosis not present

## 2023-05-03 NOTE — Progress Notes (Unsigned)
[name] [address] [place] [address] Phone: [phone] Subjective:   Zachary Graham, am serving as a scribe for Dr. Antoine Primas.  I'm seeing this patient by the request  of:  [name], MD  CC: Back pain follow-up  VHQ:[id number]  Zachary Graham is a 62 y.o. male coming in with complaint of back and neck pain. OMT 03/10/2023. Patient states that his back has been doing well. Pain in arch of L foot more on lateral aspect for past few weeks. Pain with PF. No injury. Pain is the same regardless of shoe wear. No history of pain in this area.   Notes some tightness in chest last night when walking dogs. He has been working longer hours and wonders if he is more anxious. Sensation is gone in the morning but seems to come about when he takes a break during the work day. Denies any SOB.   Medications patient has been prescribed: None  Taking:         Reviewed prior external information including notes and imaging from previsou exam, outside providers and external EMR if available.   As well as notes that were available from care everywhere and other healthcare systems.  Past medical history, social, surgical and family history all reviewed in electronic medical record.  No pertanent information unless stated regarding to the chief complaint.   Past Medical History:  Diagnosis Date   Diabetes mellitus    GERD (gastroesophageal reflux disease)    Heart murmur    Hyperlipidemia    Hypertension    OSA (obstructive sleep apnea)    Pneumonia     Allergies  Allergen Reactions   Lisinopril Swelling and Other (See Comments)    Extreme facial swelling causing hospitalization   Shellfish Allergy Anaphylaxis   Zetia [Ezetimibe] Other (See Comments)    ABDOMINAL CRAMPING   Dulaglutide Other (See Comments)   Gabapentin     Other reaction(s): Dizziness   Gabapentin (Once-Daily) Other (See Comments)   Iodine    Penicillins Other (See  Comments)    UNSPECIFIED REACTION > Childhood allergy     Cymbalta [Duloxetine Hcl] Other (See Comments)    Severe feet cramps     Review of Systems:  No headache, visual changes, nausea, vomiting, diarrhea, constipation, dizziness, abdominal pain, skin rash, fevers, chills, night sweats, weight loss, swollen lymph nodes, body aches, joint swelling, chest pain, shortness of breath, mood changes. POSITIVE muscle aches  Objective  Blood pressure 128/78, pulse 66, height 5' 9.5" (1.765 m), weight 237 lb (107.5 kg), SpO2 97%.   General: No apparent distress alert and oriented x3 mood and affect normal, dressed appropriately.  HEENT: Pupils equal, extraocular movements intact  Respiratory: Patient's speak in full sentences and does not appear short of breath  Cardiovascular: No lower extremity edema, non tender, no erythema  Gait MSK:  Back does have some loss lordosis noted.  Some tenderness to palpation in the paraspinal musculature.  Tightness still with extension and flexion in the back.  That is much tenderness though noted  Osteopathic findings   T7 extended rotated and side bent left L4 flexed rotated and side bent left Sacrum right on right    Assessment and Plan:  Low back pain Multifactorial.  Still making some progress but very slow overall.  Discussed icing regimen and home exercises, discussed which activities to do and which ones to avoid.  Increase activity slowly over the course the next  several weeks.  No other significant changes with the holiday season follow-up with me again in 6 to 8 weeks    Nonallopathic problems  Decision today to treat with OMT was based on Physical Exam  After verbal consent patient was treated with HVLA, ME, FPR techniques in thoracic, lumbar, and sacral  areas  Patient tolerated the procedure well with improvement in symptoms  Patient given exercises, stretches and lifestyle modifications  See medications in patient instructions if  given  Patient will follow up in 4-8 weeks     The above documentation has been reviewed and is accurate and complete Judi Saa, DO         Note: This dictation was prepared with Dragon dictation along with smaller phrase technology. Any transcriptional errors that result from this process are unintentional.

## 2023-05-04 ENCOUNTER — Encounter: Payer: Self-pay | Admitting: Family Medicine

## 2023-05-04 ENCOUNTER — Ambulatory Visit: Payer: 59 | Admitting: Family Medicine

## 2023-05-04 VITALS — BP 128/78 | HR 66 | Ht 69.5 in | Wt 237.0 lb

## 2023-05-04 DIAGNOSIS — M9903 Segmental and somatic dysfunction of lumbar region: Secondary | ICD-10-CM | POA: Diagnosis not present

## 2023-05-04 DIAGNOSIS — M9904 Segmental and somatic dysfunction of sacral region: Secondary | ICD-10-CM | POA: Diagnosis not present

## 2023-05-04 DIAGNOSIS — M545 Low back pain, unspecified: Secondary | ICD-10-CM

## 2023-05-04 DIAGNOSIS — M9902 Segmental and somatic dysfunction of thoracic region: Secondary | ICD-10-CM | POA: Diagnosis not present

## 2023-05-04 DIAGNOSIS — G8929 Other chronic pain: Secondary | ICD-10-CM

## 2023-05-04 NOTE — Assessment & Plan Note (Signed)
Multifactorial.  Still making some progress but very slow overall.  Discussed icing regimen and home exercises, discussed which activities to do and which ones to avoid.  Increase activity slowly over the course the next several weeks.  No other significant changes with the holiday season follow-up with me again in 6 to 8 weeks

## 2023-05-04 NOTE — Patient Instructions (Signed)
Carbon fiber plate Avoid being barefoot  HOKA recovery sandals See me in 7-8 weeks

## 2023-05-05 ENCOUNTER — Other Ambulatory Visit (HOSPITAL_COMMUNITY): Payer: Self-pay

## 2023-05-06 ENCOUNTER — Other Ambulatory Visit (HOSPITAL_COMMUNITY): Payer: Self-pay

## 2023-05-09 ENCOUNTER — Other Ambulatory Visit: Payer: Self-pay | Admitting: Internal Medicine

## 2023-05-09 ENCOUNTER — Other Ambulatory Visit (HOSPITAL_COMMUNITY): Payer: Self-pay

## 2023-05-09 DIAGNOSIS — F900 Attention-deficit hyperactivity disorder, predominantly inattentive type: Secondary | ICD-10-CM

## 2023-05-10 ENCOUNTER — Other Ambulatory Visit (HOSPITAL_COMMUNITY): Payer: Self-pay

## 2023-05-10 MED ORDER — AMPHETAMINE-DEXTROAMPHET ER 20 MG PO CP24
20.0000 mg | ORAL_CAPSULE | Freq: Every morning | ORAL | 0 refills | Status: DC
  Filled 2023-05-10: qty 30, 30d supply, fill #0

## 2023-05-15 ENCOUNTER — Encounter: Payer: Self-pay | Admitting: Family Medicine

## 2023-05-16 ENCOUNTER — Other Ambulatory Visit (HOSPITAL_COMMUNITY): Payer: Self-pay

## 2023-05-17 ENCOUNTER — Encounter: Payer: Self-pay | Admitting: Family Medicine

## 2023-05-23 ENCOUNTER — Other Ambulatory Visit (HOSPITAL_COMMUNITY): Payer: Self-pay

## 2023-05-24 ENCOUNTER — Other Ambulatory Visit (HOSPITAL_COMMUNITY): Payer: Self-pay

## 2023-06-01 ENCOUNTER — Other Ambulatory Visit (HOSPITAL_COMMUNITY): Payer: Self-pay

## 2023-06-08 LAB — HM DIABETES EYE EXAM

## 2023-06-10 ENCOUNTER — Other Ambulatory Visit (HOSPITAL_COMMUNITY): Payer: Self-pay

## 2023-06-16 ENCOUNTER — Other Ambulatory Visit (HOSPITAL_COMMUNITY): Payer: Self-pay

## 2023-06-16 NOTE — Progress Notes (Signed)
 [facility] [address] [place] [address] Phone: [phone] Subjective:   Zachary Graham, am serving as a scribe for Dr. Arthea Claudene.  I'm seeing this patient by the request  of:  [name], MD  CC: back and neck pain   YEP:Dlagzrupcz  Zachary Graham is a 63 y.o. male coming in with complaint of back and neck pain. OMT 05/04/2023. He has not had issues with his back.   Patient states that he uses carbon fiber insole in L tennis shoe. Also using recovery sandals in the house. Continues to have pain when moving faster in the arch both on top of foot and on plantar aspect.   Cardiology saw increased PVCs and had to come off of adderol.   Medications patient has been prescribed: None  Taking:         Reviewed prior external information including notes and imaging from previsou exam, outside providers and external EMR if available.   As well as notes that were available from care everywhere and other healthcare systems.  Past medical history, social, surgical and family history all reviewed in electronic medical record.  No pertanent information unless stated regarding to the chief complaint.   Past Medical History:  Diagnosis Date   Diabetes mellitus    GERD (gastroesophageal reflux disease)    Heart murmur    Hyperlipidemia    Hypertension    OSA (obstructive sleep apnea)    Pneumonia     Allergies  Allergen Reactions   Lisinopril Swelling and Other (See Comments)    Extreme facial swelling causing hospitalization   Shellfish Allergy Anaphylaxis   Zetia  [Ezetimibe ] Other (See Comments)    ABDOMINAL CRAMPING   Dulaglutide  Other (See Comments)   Gabapentin     Other reaction(s): Dizziness   Gabapentin (Once-Daily) Other (See Comments)   Iodine    Penicillins Other (See Comments)    UNSPECIFIED REACTION > Childhood allergy     Cymbalta  [Duloxetine  Hcl] Other (See Comments)    Severe feet cramps     Review of  Systems:  No headache, visual changes, nausea, vomiting, diarrhea, constipation, dizziness, abdominal pain, skin rash, fevers, chills, night sweats, weight loss, swollen lymph nodes, body aches, joint swelling, chest pain, shortness of breath, mood changes. POSITIVE muscle aches  Objective  Blood pressure 102/62, pulse (!) 45, height 5' 9 (1.753 m), weight 224 lb (101.6 kg), SpO2 96%.   General: No apparent distress alert and oriented x3 mood and affect normal, dressed appropriately.  HEENT: Pupils equal, extraocular movements intact  Respiratory: Patient's speak in full sentences and does not appear short of breath  Cardiovascular: No lower extremity edema, non tender, no erythema patient does have a large very holosystolic murmur 3+ out of 6 at the left sternal border Gait antalgic favoring the left ankle MSK:  Back low back does have some loss lordosis noted.  Some tenderness to palpation in the paraspinal musculature.  Tightness around the sacroiliac joint.  Osteopathic findings   T9 extended rotated and side bent left L1 flexed rotated and side bent right Sacrum right on right       Assessment and Plan:  Low back pain Chronic problem with exacerbation.  Will continue to monitor.  Discussed icing regimen and home exercises, discussed which activities to do and which ones to avoid.  Patient has been on Ozempic is the only new thing.  No sign of any type of abdominal pain.  Continuing  to follow-up with his other providers.  Hypertension Hypotensive today with a sinus bradycardia noted today on exam.  Does have a fairly significant stenosis with a past medical history significant for pulmonic stenosis.  Patient is being followed by cardiology.  Awaiting the patient's recent cardiac monitor     Nonallopathic problems  Decision today to treat with OMT was based on Physical Exam  After verbal consent patient was treated with HVLA, ME, FPR techniques in thoracic, lumbar, and  sacral  areas  Patient tolerated the procedure well with improvement in symptoms  Patient given exercises, stretches and lifestyle modifications  See medications in patient instructions if given  Patient will follow up in 4-8 weeks     The above documentation has been reviewed and is accurate and complete Marili Vader M Malerie Eakins, DO         Note: This dictation was prepared with Dragon dictation along with smaller phrase technology. Any transcriptional errors that result from this process are unintentional.

## 2023-06-16 NOTE — Progress Notes (Signed)
Cardiology Office Note    Patient Name: Zachary Graham Date of Encounter: 06/16/2023  Primary Care Provider:  [name], MD Primary Cardiologist:  Kristeen Miss, MD Primary Electrophysiologist: None   Past Medical History    Past Medical History:  Diagnosis Date   Diabetes mellitus    GERD (gastroesophageal reflux disease)    Heart murmur    Hyperlipidemia    Hypertension    OSA (obstructive sleep apnea)    Pneumonia     History of Present Illness  Zachary Graham is a 63 y.o. male with a PMH of essential HTN, DM type II, HLD, pulmonic stenosis, OSA, GERD RBBB who presents today for 62-month follow-up.  [name] was seen initially by Dr. Jens Som in 2014 evaluation of a murmur.  He underwent a 2D echo that showed grade 2 DD with mild left atrial enlargement and mild pulmonic stenosis with peak gradient of 16 mmHg.  He underwent an exercise stress test in 2018 hypertensive exercise response and no evidence of ischemia with overall low risk.  He had a repeat 2D echo in 2022 that showed EF of 55 to 60% he was seen next by Dr. Elease Hashimoto on 09/29/2022 for complaint of chest pressure and palpitations.  He has frequent ventricular couplets and was tried on Toprol-XL 25 mg but experienced lots of fatigue.  This was decreased to 12.5 mg and heart rate seem to be better controlled.  2D echo was repeated showing EF of 55 to 60% with no RWMA and mild to moderate dilated LA with trivial MVR borderline mild pulmonic stenosis with mean gradient of 7 mmHg.  He was seen for follow-up on 12/27/2022 with plan to go back to 25 mg a day if heart rate remained elevated.  [name] presents with concerns about frequent premature ventricular contractions (PVCs). The patient reports that these PVCs have been present since his youth. The patient denies any chest discomfort or shortness of breath but does report a general feeling of malaise. The patient also reports a history of foot cramps for the past four years and  is currently taking a magnesium supplement. The patient's father had a history of thyroid problems. The patient is also on Ozempic for blood glucose control and reports a decrease in appetite since starting the medication. The patient consumes a significant amount of diet soda daily, which may be contributing to the PVCs.  Patient denies chest pain, palpitations, dyspnea, PND, orthopnea, nausea, vomiting, dizziness, syncope, edema, weight gain, or early satiety.   Review of Systems  Please see the history of present illness.    All other systems reviewed and are otherwise negative except as noted above.  Physical Exam    Wt Readings from Last 3 Encounters:  05/04/23 237 lb (107.5 kg)  03/10/23 235 lb (106.6 kg)  02/18/23 235 lb 6.4 oz (106.8 kg)   RU:EAVWU were no vitals filed for this visit.,There is no height or weight on file to calculate BMI. GEN: Well nourished, well developed in no acute distress Neck: No JVD; No carotid bruits Pulmonary: Clear to auscultation without rales, wheezing or rhonchi  Cardiovascular: Normal rate. Regular rhythm. Normal S1. Normal S2.   Murmurs: There is no murmur.  ABDOMEN: Soft, non-tender, non-distended EXTREMITIES:  No edema; No deformity   EKG/LABS/ Recent Cardiac Studies   ECG personally reviewed by me today -sinus rhythm with first-degree AVB and PVCs in trigeminy and bigeminy pattern with rate of 82 bpm.  Risk Assessment/Calculations:  Lab Results  Component Value Date   WBC 8.6 10/29/2022   HGB 14.1 10/29/2022   HCT 41 10/29/2022   MCV 83.9 01/27/2022   PLT 232 10/29/2022   Lab Results  Component Value Date   CREATININE 0.8 10/29/2022   BUN 13 10/29/2022   NA 139 10/29/2022   K 4.2 10/29/2022   CL 105 10/29/2022   CO2 25 (A) 10/29/2022   Lab Results  Component Value Date   CHOL 95 10/29/2022   HDL 37 10/29/2022   LDLCALC 42 10/29/2022   LDLDIRECT 87.0 12/02/2016   TRIG 99 10/29/2022   CHOLHDL 2 01/27/2022    Lab  Results  Component Value Date   HGBA1C 8.0 (H) 02/02/2023   Assessment & Plan    1.  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 No chest pain currently. Possible contributors include Adderall use and consumption of diet sodas. Risk of ventricular arrhythmia and potential heart failure if PVCs continue frequently. -Order 3-day event monitor to assess PVC burden and determine if medication adjustment or referral to electrophysiology is needed. -Discuss with primary care physician about potential discontinuation of Adderall. -Reduce consumption of diet sodas. -Check electrolytes, including magnesium, as imbalances can contribute to PVCs.  2.  Essential hypertension: -Patient's blood pressure today was stable at 104/72 -Continue losartan 50 mg and Toprol-XL 25 mg  3.  Hyperlipidemia: -Last LDL was 42 -Continue Lipitor 10 mg daily  4. Nonrheumatic PVS: -Patient's last TTE was completed 10/2022 showing stable EF with trivial MR and small PV gradient  5. Fatigue Possible contributors include PVCs, Adderall use, and potential iron deficiency. -Continue iron supplementation as currently prescribed. -Consider adjustments to insulin pump dwell time in discussion with primary care physician  6.  Diabetes type 2: -Patient's last hemoglobin A1c was trending down at 8 -He was recently started on Ozempic -Continue current treatment plan with insulin pump per as prescribed  Disposition: Follow-up with Kristeen Miss, MD or APP in 3 months    Signed, Napoleon Form, Leodis Rains, NP 06/16/2023, 7:47 PM [facility]

## 2023-06-17 ENCOUNTER — Encounter: Payer: Self-pay | Admitting: Nurse Practitioner

## 2023-06-17 ENCOUNTER — Ambulatory Visit (INDEPENDENT_AMBULATORY_CARE_PROVIDER_SITE_OTHER): Payer: 59

## 2023-06-17 ENCOUNTER — Encounter: Payer: Self-pay | Admitting: Internal Medicine

## 2023-06-17 ENCOUNTER — Ambulatory Visit: Payer: 59 | Attending: Nurse Practitioner | Admitting: Nurse Practitioner

## 2023-06-17 VITALS — BP 104/72 | HR 82 | Ht 69.5 in | Wt 225.4 lb

## 2023-06-17 DIAGNOSIS — I493 Ventricular premature depolarization: Secondary | ICD-10-CM

## 2023-06-17 DIAGNOSIS — Z794 Long term (current) use of insulin: Secondary | ICD-10-CM | POA: Diagnosis not present

## 2023-06-17 DIAGNOSIS — R5383 Other fatigue: Secondary | ICD-10-CM

## 2023-06-17 DIAGNOSIS — I10 Essential (primary) hypertension: Secondary | ICD-10-CM | POA: Diagnosis not present

## 2023-06-17 DIAGNOSIS — I370 Nonrheumatic pulmonary valve stenosis: Secondary | ICD-10-CM | POA: Diagnosis not present

## 2023-06-17 DIAGNOSIS — E785 Hyperlipidemia, unspecified: Secondary | ICD-10-CM

## 2023-06-17 DIAGNOSIS — E119 Type 2 diabetes mellitus without complications: Secondary | ICD-10-CM | POA: Diagnosis not present

## 2023-06-17 NOTE — Patient Instructions (Addendum)
Medication Instructions:  Your physician recommends that you continue on your current medications as directed. Please refer to the Current Medication list given to you today.  *If you need a refill on your cardiac medications before your next appointment, please call your pharmacy*  Lab Work: BMET and Mag -- Today  If you have labs (blood work) drawn today and your tests are completely normal, you will receive your results only by: MyChart Message (if you have MyChart) OR A paper copy in the mail If you have any lab test that is abnormal or we need to change your treatment, we will call you to review the results.  Testing/Procedures: Your physician has requested that you wear a Zio heart monitor for 3 days.  Please allow 2 weeks after returning the heart monitor before our office calls you with the results.   Follow-Up:  Decrease intake of [place]  At [facility], you and your health needs are our priority.  As part of our continuing mission to provide you with exceptional heart care, we have created designated Provider Care Teams.  These Care Teams include your primary Cardiologist (physician) and Advanced Practice Providers (APPs -  Physician Assistants and Nurse Practitioners) who all work together to provide you with the care you need, when you need it.     Your next appointment:   3 months  Follow up with Dr.Jones  The format for your next appointment:   In Person  Provider:   Dr Melburn Popper or Robin Searing, NP

## 2023-06-17 NOTE — Progress Notes (Unsigned)
Applied a 3 day Zio XT monitor to patient in the office  Nahser to read

## 2023-06-18 LAB — BASIC METABOLIC PANEL
BUN/Creatinine Ratio: 15 (ref 10–24)
BUN: 12 mg/dL (ref 8–27)
CO2: 22 mmol/L (ref 20–29)
Calcium: 9.7 mg/dL (ref 8.6–10.2)
Chloride: 102 mmol/L (ref 96–106)
Creatinine, Ser: 0.8 mg/dL (ref 0.76–1.27)
Glucose: 124 mg/dL — ABNORMAL HIGH (ref 70–99)
Potassium: 4.3 mmol/L (ref 3.5–5.2)
Sodium: 141 mmol/L (ref 134–144)
eGFR: 101 mL/min/{1.73_m2} (ref >59)

## 2023-06-18 LAB — MAGNESIUM: Magnesium: 1.7 mg/dL (ref 1.6–2.3)

## 2023-06-20 ENCOUNTER — Other Ambulatory Visit: Payer: Self-pay | Admitting: Internal Medicine

## 2023-06-22 ENCOUNTER — Ambulatory Visit (INDEPENDENT_AMBULATORY_CARE_PROVIDER_SITE_OTHER): Payer: 59

## 2023-06-22 ENCOUNTER — Encounter: Payer: Self-pay | Admitting: Family Medicine

## 2023-06-22 ENCOUNTER — Ambulatory Visit (INDEPENDENT_AMBULATORY_CARE_PROVIDER_SITE_OTHER): Payer: 59 | Admitting: Family Medicine

## 2023-06-22 VITALS — BP 102/62 | HR 45 | Ht 69.0 in | Wt 224.0 lb

## 2023-06-22 DIAGNOSIS — I10 Essential (primary) hypertension: Secondary | ICD-10-CM

## 2023-06-22 DIAGNOSIS — M545 Low back pain, unspecified: Secondary | ICD-10-CM | POA: Diagnosis not present

## 2023-06-22 DIAGNOSIS — M9904 Segmental and somatic dysfunction of sacral region: Secondary | ICD-10-CM | POA: Diagnosis not present

## 2023-06-22 DIAGNOSIS — M9902 Segmental and somatic dysfunction of thoracic region: Secondary | ICD-10-CM

## 2023-06-22 DIAGNOSIS — M25572 Pain in left ankle and joints of left foot: Secondary | ICD-10-CM | POA: Insufficient documentation

## 2023-06-22 DIAGNOSIS — M9903 Segmental and somatic dysfunction of lumbar region: Secondary | ICD-10-CM

## 2023-06-22 DIAGNOSIS — G8929 Other chronic pain: Secondary | ICD-10-CM | POA: Diagnosis not present

## 2023-06-22 NOTE — Assessment & Plan Note (Signed)
 Hypotensive today with a sinus bradycardia noted today on exam.  Does have a fairly significant stenosis with a past medical history significant for pulmonic stenosis.  Patient is being followed by cardiology.  Awaiting the patient's recent cardiac monitor

## 2023-06-22 NOTE — Assessment & Plan Note (Signed)
 Chronic problem with exacerbation.  Will continue to monitor.  Discussed icing regimen and home exercises, discussed which activities to do and which ones to avoid.  Patient has been on Ozempic is the only new thing.  No sign of any type of abdominal pain.  Continuing to follow-up with his other providers.

## 2023-06-22 NOTE — Assessment & Plan Note (Signed)
 Known midfoot arthritis.  Discussed icing regimen and home exercises, discussed which activities to do and which ones to avoid.  Increase activity slowly otherwise.  Follow-up again in 6 to 8 weeks.  X-rays are pending.

## 2023-06-22 NOTE — Patient Instructions (Signed)
 L ankle xray See me in 6 weeks

## 2023-06-23 ENCOUNTER — Encounter: Payer: Self-pay | Admitting: Family Medicine

## 2023-06-24 ENCOUNTER — Other Ambulatory Visit: Payer: Self-pay

## 2023-06-24 DIAGNOSIS — M722 Plantar fascial fibromatosis: Secondary | ICD-10-CM

## 2023-06-24 NOTE — Telephone Encounter (Signed)
 Samples have been put back in our stock. (Never Picked Up)  Device/Supplies: 3 mL Cartridges and syringes Quantity:3  Autum Benfer,RMA

## 2023-06-29 DIAGNOSIS — I493 Ventricular premature depolarization: Secondary | ICD-10-CM | POA: Diagnosis not present

## 2023-06-30 ENCOUNTER — Other Ambulatory Visit (HOSPITAL_COMMUNITY): Payer: Self-pay

## 2023-07-01 ENCOUNTER — Other Ambulatory Visit: Payer: Self-pay

## 2023-07-01 DIAGNOSIS — I493 Ventricular premature depolarization: Secondary | ICD-10-CM

## 2023-07-04 ENCOUNTER — Other Ambulatory Visit (HOSPITAL_COMMUNITY): Payer: Self-pay

## 2023-07-05 NOTE — Therapy (Signed)
OUTPATIENT PHYSICAL THERAPY LOWER EXTREMITY EVALUATION   Patient Name: Zachary Graham MRN: [id number] DOB:July 24, 1960, 63 y.o., male Today's Date: 07/06/2023  END OF SESSION:  PT End of Session - 07/06/23 1630     Visit Number 1    Number of Visits 13    Date for PT Re-Evaluation 08/26/23    Authorization Type [place] [employer] SAVE    PT Start Time 0805    PT Stop Time 0850    PT Time Calculation (min) 45 min    Activity Tolerance Patient tolerated treatment well    Behavior During Therapy WFL for tasks assessed/performed             Past Medical History:  Diagnosis Date   Diabetes mellitus    GERD (gastroesophageal reflux disease)    Heart murmur    Hyperlipidemia    Hypertension    OSA (obstructive sleep apnea)    Pneumonia    Past Surgical History:  Procedure Laterality Date   CERVICAL DISC ARTHROPLASTY N/A 03/16/2017   Procedure: CERVICAL FIVE- CERVICAL SIX DISC ARTHROPLASTY;  Surgeon: Shirlean Kelly, MD;  Location: [facility] OR;  Service: Neurosurgery;  Laterality: N/A;  CERVICAL 5- CERVICAL 6 DISC ARTHROPLASTY   COLONOSCOPY     TONSILLECTOMY     Patient Active Problem List   Diagnosis Date Noted   Left ankle pain 06/22/2023   Left rib fracture 11/26/2022   Right wrist pain 11/26/2022   Preglaucoma, unspecified, bilateral 01/14/2022   Seborrheic keratosis 01/14/2022   Type 1 diabetes mellitus with hyperglycemia (HCC) 01/14/2022   Vasomotor rhinitis 01/14/2022   Adult attention deficit hyperactivity disorder 01/14/2022   Allergy to shellfish 01/14/2022   Dysphagia, oropharyngeal phase 01/14/2022   Gastro-esophageal reflux disease without esophagitis 01/14/2022   Low back pain 10/29/2021   Iron deficiency anemia secondary to inadequate dietary iron intake 02/02/2021   Frequent PVCs 10/28/2020   Hypertension    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) 03/20/2018   Mild nonproliferative diabetic retinopathy of both eyes without  macular edema associated with type 2 diabetes mellitus (HCC) 08/31/2017   Mild cataract of right eye 08/31/2017   HNP (herniated nucleus pulposus), cervical 03/16/2017   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 (HCC) 01/06/2017   Herniation of cervical intervertebral disc with radiculopathy 01/04/2017   Current mild episode of major depressive disorder without prior episode (HCC) 12/02/2016   Diabetic neuropathy (HCC) 03/01/2016   Obesity, Class II, BMI 35.0-39.9, with comorbidity (see actual BMI) 04/24/2015   Type 1 diabetes mellitus with hyperglycemia, with long-term current use of insulin (HCC) 04/04/2015   Patellofemoral stress syndrome of left knee 03/19/2015   Allergic rhinitis due to pollen 11/14/2014   Equinus deformity of foot, acquired 05/29/2014   ADHD (attention deficit hyperactivity disorder), inattentive type 10/24/2013   Vitamin D deficiency 02/28/2013   Pulmonic stenosis 05/25/2012   Pure hyperglyceridemia 05/25/2012   Hyperlipidemia with target LDL less than 100 09/20/2011   OSA (obstructive sleep apnea) 09/20/2011   Routine general medical examination at a health care facility 09/20/2011    PCP: [name], MD   REFERRING PROVIDER: Judi Saa, DO  REFERRING DIAG: M72.2 (ICD-10-CM) - Plantar fasciitis, left   THERAPY DIAG:  Pain in left foot  Decreased range of motion of left ankle  Difficulty in walking, not elsewhere classified  Rationale for Evaluation and Treatment: Rehabilitation  ONSET DATE: 9 weeks  SUBJECTIVE:   SUBJECTIVE STATEMENT: Pt reports  an insidious onset of L foot pain. Pt notes using a carbon fiber insert, as well as orthotic insert together, have helped to make walking more comfortable. Pt states after experiencing increases in L foot pain, rest alone will lower the pain to a very low ache in a short time frame.  PERTINENT HISTORY: High BMI; See PMH  PAIN:  Are you having pain?  Yes: NPRS scale: Ave 3/10: pain range the week prior to start of PT: 1-8/10 Pain location: Ache of the foot Pain description: Sharp, ache Aggravating factors: Prolonged walking and certain movements Relieving factors: Rest  PRECAUTIONS: None  RED FLAGS: None   WEIGHT BEARING RESTRICTIONS: No  FALLS:  Has patient fallen in last 6 months? 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 Katrinka Blazing 3/19  OBJECTIVE:  Note: Objective measures were completed at Evaluation unless otherwise noted.  DIAGNOSTIC FINDINGS:  L ankle DG 06/22/23 FINDINGS: There is no evidence of fracture, dislocation, or joint effusion. There is no evidence of arthropathy or other focal bone abnormality. Soft tissues are unremarkable.   IMPRESSION: Negative.  PATIENT SURVEYS:  LEFS 42/80- Mild to mod functional limitation  COGNITION: Overall cognitive status: Within functional limits for tasks assessed     SENSATION: WFL  EDEMA:  None observed or palpated  MUSCLE LENGTH: Hamstrings: Right 30 deg; Left 30 deg Thomas test: Right tight deg; Left tight deg  POSTURE:  Out toeing, normal arch  PALPATION: TTP arch of L foot  LOWER EXTREMITY ROM:   Active ROM Right eval Left eval  Hip flexion    Hip extension    Hip abduction    Hip adduction    Hip internal rotation    Hip external rotation    Knee flexion    Knee extension    Ankle dorsiflexion 7 knee extended 7 knee extended  Ankle plantarflexion    Ankle inversion    Ankle eversion     (Blank rows = not tested)  LOWER EXTREMITY MMT:  MMT Right eval Left eval  Hip flexion 4+ 4+  Hip extension 4+ 4+  Hip abduction 5 5  Hip adduction 5 5  Hip internal rotation 5 5  Hip external rotation 5 5  Knee flexion 5 5  Knee extension 5 5  Ankle dorsiflexion 5 5  Ankle  plantarflexion 5 5  Ankle inversion 5 5  Ankle eversion 5 5   (Blank rows = not tested)  LOWER EXTREMITY SPECIAL TESTS:  NT  FUNCTIONAL TESTS:  2 minute walk test: TBA  GAIT: Distance walked: 200' Assistive device utilized: None Level of assistance: Complete Independence Comments: Decreased push off L when barefooted  TREATMENT DATE:  [facility] Adult PT Treatment:                                                DATE: 07/06/23 Therapeutic Exercise: Developed, instructed in, and pt completed therex as noted in HEP  Self Care: Anticipated course of care    PATIENT EDUCATION:  Education details: Eval findings, POC, HEP, self care  Person educated: Patient Education method: Explanation, Demonstration, Tactile cues, Verbal cues, and Handouts Education comprehension: verbalized understanding, returned demonstration, verbal cues required, and tactile cues required  HOME EXERCISE PROGRAM: Access Code: [email or web address] URL: [email or web address] Date: 07/07/2023 Prepared by: Joellyn Rued  Exercises - Long Sitting Calf Stretch with Strap  - 1 x daily - 7 x weekly - 1 sets - 5 reps - 20 hold - Heel Raises with Counter Support  - 1 x daily - 7 x weekly - 3 sets - 10 reps - 3 hold  ASSESSMENT:  CLINICAL IMPRESSION: Patient is a 63 y.o. male who was seen today for physical therapy evaluation and treatment for M72.2 (ICD-10-CM) - Plantar fasciitis, left . Pt presents with signs and symptoms consistent with the referring Dx. Pt has decreased ankle DF AROM, markedly decreased hamstring flexibility, and is significantly TTP of the arch of the L foot. A HEP for stretching, and strengthening (tissue remodeling) was initiated. Pt will benefit from skilled PT 2w6 to address impairments to optimize L foot function with less pain.   OBJECTIVE IMPAIRMENTS: decreased activity  tolerance, difficulty walking, decreased ROM, increased fascial restrictions, obesity, and pain.   ACTIVITY LIMITATIONS: carrying, lifting, standing, stairs, and locomotion level  PARTICIPATION LIMITATIONS: meal prep, cleaning, shopping, community activity, and occupation  PERSONAL FACTORS: Past/current experiences and 1 comorbidity: high BMI  are also affecting patient's functional outcome.   REHAB POTENTIAL: Good  CLINICAL DECISION MAKING: Stable/uncomplicated  EVALUATION COMPLEXITY: Low   GOALS:  LONG TERM GOALS: Target date: 08/26/23  Pt will be Ind in a final HEP to maintain achieved LOF  Baseline: initiated Goal status: INITIAL  2.  Increase L ankle DF AROM c ext knee to 12d or greater to minimize plantar fascia strain Baseline: 7d Goal status: INITIAL  3.  Increase L hamstring flexibility to 40d or greater to minimize posterior LE muscular strain Baseline: 30d Goal status: INITIAL  4.  Pt will report a 50% or greater improvement in L foot pain for improved function and QOL Baseline: 3/10 ave; 1-8/10 Goal status: INITIAL  5.  .Improve by MCID of 68ft as indication of improved functional mobility if pt scores below standard for age Baseline: TBA Goal status: INITIAL   PLAN:  PT FREQUENCY: 2x/week  PT DURATION: 6 weeks  PLANNED INTERVENTIONS: 97164- PT Re-evaluation, 97110-Therapeutic exercises, 97530- Therapeutic activity, [id number]- Neuromuscular re-education, 97535- Self Care, 21308- Manual therapy, [id number]- Gait training, [phone]- Electrical stimulation (unattended), [id number]- Electrical stimulation (manual), [id number]- Ionotophoresis 4mg /ml Dexamethasone, Patient/Family education, Taping, Dry Needling, Joint mobilization, Cryotherapy, and Moist heat  PLAN FOR NEXT SESSION: Assess ; assess response to HEP; progress therex as indicated; use of modalities, manual therapy; and TPDN as indicated.  Chantille Navarrete MS, PT 07/07/23 10:26 AM

## 2023-07-06 ENCOUNTER — Other Ambulatory Visit: Payer: Self-pay

## 2023-07-06 ENCOUNTER — Ambulatory Visit: Payer: 59 | Attending: Family Medicine

## 2023-07-06 DIAGNOSIS — M722 Plantar fascial fibromatosis: Secondary | ICD-10-CM | POA: Insufficient documentation

## 2023-07-06 DIAGNOSIS — M79672 Pain in left foot: Secondary | ICD-10-CM | POA: Insufficient documentation

## 2023-07-06 DIAGNOSIS — M25672 Stiffness of left ankle, not elsewhere classified: Secondary | ICD-10-CM | POA: Diagnosis not present

## 2023-07-06 DIAGNOSIS — R262 Difficulty in walking, not elsewhere classified: Secondary | ICD-10-CM | POA: Diagnosis not present

## 2023-07-11 ENCOUNTER — Encounter: Payer: Self-pay | Admitting: Physical Therapy

## 2023-07-11 ENCOUNTER — Ambulatory Visit: Payer: 59 | Admitting: Physical Therapy

## 2023-07-11 DIAGNOSIS — M79672 Pain in left foot: Secondary | ICD-10-CM | POA: Diagnosis not present

## 2023-07-11 DIAGNOSIS — R262 Difficulty in walking, not elsewhere classified: Secondary | ICD-10-CM | POA: Diagnosis not present

## 2023-07-11 DIAGNOSIS — M25672 Stiffness of left ankle, not elsewhere classified: Secondary | ICD-10-CM | POA: Diagnosis not present

## 2023-07-11 DIAGNOSIS — M722 Plantar fascial fibromatosis: Secondary | ICD-10-CM | POA: Diagnosis not present

## 2023-07-11 NOTE — Therapy (Signed)
 OUTPATIENT PHYSICAL THERAPY TREATMENT    Patient Name: Zachary Graham MRN: [id number] DOB:1960-05-29, 63 y.o., male Today's Date: 07/11/2023  END OF SESSION:  PT End of Session - 07/11/23 0849     Visit Number 2    Number of Visits 13    Date for PT Re-Evaluation 08/26/23    Authorization Type [place] [employer]ET[employer] SAVE    PT Start Time 0847    PT Stop Time 0930    PT Time Calculation (min) 43 min             Past Medical History:  Diagnosis Date   Diabetes mellitus    GERD (gastroesophageal reflux disease)    Heart murmur    Hyperlipidemia    Hypertension    OSA (obstructive sleep apnea)    Pneumonia    Past Surgical History:  Procedure Laterality Date   CERVICAL DISC ARTHROPLASTY N/A 03/16/2017   Procedure: CERVICAL FIVE- CERVICAL SIX DISC ARTHROPLASTY;  Surgeon: Shirlean Kelly, MD;  Location: [facility] OR;  Service: Neurosurgery;  Laterality: N/A;  CERVICAL 5- CERVICAL 6 DISC ARTHROPLASTY   COLONOSCOPY     TONSILLECTOMY     Patient Active Problem List   Diagnosis Date Noted   Left ankle pain 06/22/2023   Left rib fracture 11/26/2022   Right wrist pain 11/26/2022   Preglaucoma, unspecified, bilateral 01/14/2022   Seborrheic keratosis 01/14/2022   Type 1 diabetes mellitus with hyperglycemia (HCC) 01/14/2022   Vasomotor rhinitis 01/14/2022   Adult attention deficit hyperactivity disorder 01/14/2022   Allergy to shellfish 01/14/2022   Dysphagia, oropharyngeal phase 01/14/2022   Gastro-esophageal reflux disease without esophagitis 01/14/2022   Low back pain 10/29/2021   Iron deficiency anemia secondary to inadequate dietary iron intake 02/02/2021   Frequent PVCs 10/28/2020   Hypertension    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) 03/20/2018   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 diabetes mellitus (HCC) 08/31/2017   Mild cataract of right eye 08/31/2017   HNP (herniated  nucleus pulposus), cervical 03/16/2017   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 (HCC) 01/06/2017   Herniation of cervical intervertebral disc with radiculopathy 01/04/2017   Current mild episode of major depressive disorder without prior episode (HCC) 12/02/2016   Diabetic neuropathy (HCC) 03/01/2016   Obesity, Class II, BMI 35.0-39.9, with comorbidity (see actual BMI) 04/24/2015   Type 1 diabetes mellitus with hyperglycemia, with long-term current use of insulin (HCC) 04/04/2015   Patellofemoral stress syndrome of left knee 03/19/2015   Allergic rhinitis due to pollen 11/14/2014   Equinus deformity of foot, acquired 05/29/2014   ADHD (attention deficit hyperactivity disorder), inattentive type 10/24/2013   Vitamin D deficiency 02/28/2013   Pulmonic stenosis 05/25/2012   Pure hyperglyceridemia 05/25/2012   Hyperlipidemia with target LDL less than 100 09/20/2011   OSA (obstructive sleep apnea) 09/20/2011   Routine general medical examination at a health care facility 09/20/2011    PCP: [name], MD   REFERRING PROVIDER: Judi Saa, DO  REFERRING DIAG: M72.2 (ICD-10-CM) - Plantar fasciitis, left   THERAPY DIAG:  Pain in left foot  Decreased range of motion of left ankle  Difficulty in walking, not elsewhere classified  Rationale for Evaluation and Treatment: Rehabilitation  ONSET DATE: 9 weeks  SUBJECTIVE:   SUBJECTIVE STATEMENT: No pain on arrival  EVAL: Pt reports an insidious onset of L foot pain. Pt notes using a carbon fiber insert,  as well as orthotic insert together, have helped to make walking more comfortable. Pt states after experiencing increases in L foot pain, rest alone will lower the pain to a very low ache in a short time frame.  PERTINENT HISTORY: High BMI; See PMH  PAIN:  Are you having pain? Yes: NPRS scale: 0/10: pain range the week prior to start of PT: 1-8/10 Pain location: Ache of the  foot Pain description: Sharp, ache Aggravating factors: Prolonged walking and certain movements Relieving factors: Rest  PRECAUTIONS: None  RED FLAGS: None   WEIGHT BEARING RESTRICTIONS: No  FALLS:  Has patient fallen in last 6 months? 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 Katrinka Blazing 3/19  OBJECTIVE:  Note: Objective measures were completed at Evaluation unless otherwise noted.  DIAGNOSTIC FINDINGS:  L ankle DG 06/22/23 FINDINGS: There is no evidence of fracture, dislocation, or joint effusion. There is no evidence of arthropathy or other focal bone abnormality. Soft tissues are unremarkable.   IMPRESSION: Negative.  PATIENT SURVEYS:  LEFS 42/80- Mild to mod functional limitation  COGNITION: Overall cognitive status: Within functional limits for tasks assessed     SENSATION: WFL  EDEMA:  None observed or palpated  MUSCLE LENGTH: Hamstrings: Right 30 deg; Left 30 deg Thomas test: Right tight deg; Left tight deg  POSTURE:  Out toeing, normal arch  PALPATION: TTP arch of L foot  LOWER EXTREMITY ROM:   Active ROM Right eval Left eval  Hip flexion    Hip extension    Hip abduction    Hip adduction    Hip internal rotation    Hip external rotation    Knee flexion    Knee extension    Ankle dorsiflexion 7 knee extended 7 knee extended  Ankle plantarflexion    Ankle inversion    Ankle eversion     (Blank rows = not tested)  LOWER EXTREMITY MMT:  MMT Right eval Left eval  Hip flexion 4+ 4+  Hip extension 4+ 4+  Hip abduction 5 5  Hip adduction 5 5  Hip internal rotation 5 5  Hip external rotation 5 5  Knee flexion 5 5  Knee extension 5 5  Ankle dorsiflexion 5 5  Ankle plantarflexion 5 5  Ankle inversion 5 5  Ankle eversion 5 5   (Blank rows = not tested)  LOWER EXTREMITY  SPECIAL TESTS:  NT  FUNCTIONAL TESTS:  2 minute walk test: TBA SLS 8 sec   GAIT: Distance walked: 200' Assistive device utilized: None Level of assistance: Complete Independence Comments: Decreased push off L when barefooted                                                                                                                        TREATMENT DATE:  [facility] Adult PT Treatment:  DATE: 07/11/23 Therapeutic Exercise: Bilat heel raise 2 x 20- edge of towel roll  Gastroc stretch bilateral  Seated toe scrunches Seated toe Yoga Seated plantar fascia stretch active and  Seated self passive Pfascia stretch from figure 4  Therapeutic Activity: 2 MWT  SLS 8 sec Left SLS 20 sec right Self Care: Ice bottle rolling along plantar surface, seated (performed in clinic)  x 2 minutes Self plantar fascia massage from figure 4 position Modalities: Ice pack plantar surface x 5 minutes    [facility] Adult PT Treatment:                                                DATE: 07/06/23 Therapeutic Exercise: Developed, instructed in, and pt completed therex as noted in HEP  Self Care: Anticipated course of care    PATIENT EDUCATION:  Education details: Eval findings, POC, HEP, self care  Person educated: Patient Education method: Explanation, Demonstration, Tactile cues, Verbal cues, and Handouts Education comprehension: verbalized understanding, returned demonstration, verbal cues required, and tactile cues required  HOME EXERCISE PROGRAM: Access Code: [email or web address] URL: [email or web address] Date: 07/07/2023 Prepared by: Joellyn Rued  Exercises - Long Sitting Calf Stretch with Strap  - 1 x daily - 7 x weekly - 1 sets - 5 reps - 20 hold - Heel Raises with Counter Support  - 1 x daily - 7 x weekly - 3 sets - 10 reps - 3 hold Added 2/24 - Gastroc Stretch on Wall  - 1 x daily - 7 x weekly - 1 sets - 3 reps - 30 hold - Single Leg Stance  -  1 x daily - 7 x weekly - 1 sets - 3 reps - 30 hold - Seated Plantar Fascia Stretch  - 1 x daily - 7 x weekly - 1 sets - 1-3 reps - 30 hold  ASSESSMENT:  CLINICAL IMPRESSION: No pain on arrival. Captured 2 MWT at 555 feet. Pain increased to 3-4/10 with 2 MWT. Reviewed HEP and progressed with intrinsic foot strengthening, closed chain calf stretching and self care strategies to reduce plantar pain. Pt is found to have decreased SLS time on left compared to right. Updated HEP with SLS , calf stretching and plantar fascia stretching. Encouraged ice as he has never applied ice as a treatment for this condition.   EVAL: Patient is a 63 y.o. male who was seen today for physical therapy evaluation and treatment for M72.2 (ICD-10-CM) - Plantar fasciitis, left . Pt presents with signs and symptoms consistent with the referring Dx. Pt has decreased ankle DF AROM, markedly decreased hamstring flexibility, and is significantly TTP of the arch of the L foot. A HEP for stretching, and strengthening (tissue remodeling) was initiated. Pt will benefit from skilled PT 2w6 to address impairments to optimize L foot function with less pain.   OBJECTIVE IMPAIRMENTS: decreased activity tolerance, difficulty walking, decreased ROM, increased fascial restrictions, obesity, and pain.   ACTIVITY LIMITATIONS: carrying, lifting, standing, stairs, and locomotion level  PARTICIPATION LIMITATIONS: meal prep, cleaning, shopping, community activity, and occupation  PERSONAL FACTORS: Past/current experiences and 1 comorbidity: high BMI  are also affecting patient's functional outcome.   REHAB POTENTIAL: Good  CLINICAL DECISION MAKING: Stable/uncomplicated  EVALUATION COMPLEXITY: Low   GOALS:  LONG TERM GOALS: Target date: 08/26/23  Pt will be Ind in a final HEP to maintain achieved LOF  Baseline: initiated Goal status: INITIAL  2.  Increase L ankle DF AROM c ext knee to 12d or greater to minimize plantar fascia  strain Baseline: 7d Goal status: INITIAL  3.  Increase L hamstring flexibility to 40d or greater to minimize posterior LE muscular strain Baseline: 30d Goal status: INITIAL  4.  Pt will report a 50% or greater improvement in L foot pain for improved function and QOL Baseline: 3/10 ave; 1-8/10 Goal status: INITIAL  5.  .Improve by MCID of 70ft as indication of improved functional mobility if pt scores below standard for age Baseline: TBA Goal status: INITIAL   PLAN:  PT FREQUENCY: 2x/week  PT DURATION: 6 weeks  PLANNED INTERVENTIONS: 97164- PT Re-evaluation, 97110-Therapeutic exercises, 97530- Therapeutic activity, [id number]- Neuromuscular re-education, 97535- Self Care, 16109- Manual therapy, [id number]- Gait training, [phone]- Electrical stimulation (unattended), [id number]- Electrical stimulation (manual), [id number]- Ionotophoresis 4mg /ml Dexamethasone, Patient/Family education, Taping, Dry Needling, Joint mobilization, Cryotherapy, and Moist heat  PLAN FOR NEXT SESSION: Assess ; assess response to HEP; progress therex as indicated; use of modalities, manual therapy; and TPDN as indicated.  Jannette Spanner, PTA 07/11/23 10:04 AM Phone: [phone] Fax: [phone]

## 2023-07-12 ENCOUNTER — Other Ambulatory Visit (HOSPITAL_COMMUNITY): Payer: Self-pay

## 2023-07-20 NOTE — Therapy (Signed)
 OUTPATIENT PHYSICAL THERAPY TREATMENT    Patient Name: TION TSE MRN: [id number] DOB:12/30/1960, 63 y.o., male Today's Date: 07/22/2023  END OF SESSION:  PT End of Session - 07/22/23 1025     Visit Number 3    Number of Visits 13    Date for PT Re-Evaluation 08/26/23    Authorization Type [place] [employer] SAVE    PT Start Time 0848    PT Stop Time 0930    PT Time Calculation (min) 42 min    Activity Tolerance Patient tolerated treatment well    Behavior During Therapy WFL for tasks assessed/performed              Past Medical History:  Diagnosis Date   Diabetes mellitus    GERD (gastroesophageal reflux disease)    Heart murmur    Hyperlipidemia    Hypertension    OSA (obstructive sleep apnea)    Pneumonia    Past Surgical History:  Procedure Laterality Date   CERVICAL DISC ARTHROPLASTY N/A 03/16/2017   Procedure: CERVICAL FIVE- CERVICAL SIX DISC ARTHROPLASTY;  Surgeon: Shirlean Kelly, MD;  Location: [facility] OR;  Service: Neurosurgery;  Laterality: N/A;  CERVICAL 5- CERVICAL 6 DISC ARTHROPLASTY   COLONOSCOPY     TONSILLECTOMY     Patient Active Problem List   Diagnosis Date Noted   Left ankle pain 06/22/2023   Left rib fracture 11/26/2022   Right wrist pain 11/26/2022   Preglaucoma, unspecified, bilateral 01/14/2022   Seborrheic keratosis 01/14/2022   Type 1 diabetes mellitus with hyperglycemia (HCC) 01/14/2022   Vasomotor rhinitis 01/14/2022   Adult attention deficit hyperactivity disorder 01/14/2022   Allergy to shellfish 01/14/2022   Dysphagia, oropharyngeal phase 01/14/2022   Gastro-esophageal reflux disease without esophagitis 01/14/2022   Low back pain 10/29/2021   Iron deficiency anemia secondary to inadequate dietary iron intake 02/02/2021   Frequent PVCs 10/28/2020   Hypertension    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) 03/20/2018   Mild nonproliferative diabetic retinopathy of both eyes without macular edema  associated with type 2 diabetes mellitus (HCC) 08/31/2017   Mild cataract of right eye 08/31/2017   HNP (herniated nucleus pulposus), cervical 03/16/2017   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 (HCC) 01/06/2017   Herniation of cervical intervertebral disc with radiculopathy 01/04/2017   Current mild episode of major depressive disorder without prior episode (HCC) 12/02/2016   Diabetic neuropathy (HCC) 03/01/2016   Obesity, Class II, BMI 35.0-39.9, with comorbidity (see actual BMI) 04/24/2015   Type 1 diabetes mellitus with hyperglycemia, with long-term current use of insulin (HCC) 04/04/2015   Patellofemoral stress syndrome of left knee 03/19/2015   Allergic rhinitis due to pollen 11/14/2014   Equinus deformity of foot, acquired 05/29/2014   ADHD (attention deficit hyperactivity disorder), inattentive type 10/24/2013   Vitamin D deficiency 02/28/2013   Pulmonic stenosis 05/25/2012   Pure hyperglyceridemia 05/25/2012   Hyperlipidemia with target LDL less than 100 09/20/2011   OSA (obstructive sleep apnea) 09/20/2011   Routine general medical examination at a health care facility 09/20/2011    PCP: [name], MD   REFERRING PROVIDER: Judi Saa, DO  REFERRING DIAG: M72.2 (ICD-10-CM) - Plantar fasciitis, left   THERAPY DIAG:  Pain in left foot  Decreased range of motion of left ankle  Difficulty in walking, not elsewhere classified  Rationale for Evaluation and Treatment: Rehabilitation  ONSET DATE: 9 weeks  SUBJECTIVE:   SUBJECTIVE STATEMENT: L arch  feels tight, no pain. Overall, a little better. Pain when walking is decreased consistently.Marland Kitchen  EVAL: Pt reports an insidious onset of L foot pain. Pt notes using a carbon fiber insert, as well as orthotic insert together, have helped to make walking more comfortable. Pt states after experiencing increases in L foot pain, rest alone will lower the pain to a very low ache in  a short time frame.  PERTINENT HISTORY: High BMI; See PMH  PAIN:  Are you having pain? Yes: NPRS scale: 0/10: pain range the week prior to start of PT: 1-8/10 Pain location: Ache of the foot Pain description: Sharp, ache Aggravating factors: Prolonged walking and certain movements Relieving factors: Rest  PRECAUTIONS: None  RED FLAGS: None   WEIGHT BEARING RESTRICTIONS: No  FALLS:  Has patient fallen in last 6 months? 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 Katrinka Blazing 3/19  OBJECTIVE:  Note: Objective measures were completed at Evaluation unless otherwise noted.  DIAGNOSTIC FINDINGS:  L ankle DG 06/22/23 FINDINGS: There is no evidence of fracture, dislocation, or joint effusion. There is no evidence of arthropathy or other focal bone abnormality. Soft tissues are unremarkable.   IMPRESSION: Negative.  PATIENT SURVEYS:  LEFS 42/80- Mild to mod functional limitation  COGNITION: Overall cognitive status: Within functional limits for tasks assessed     SENSATION: WFL  EDEMA:  None observed or palpated  MUSCLE LENGTH: Hamstrings: Right 30 deg; Left 30 deg Thomas test: Right tight deg; Left tight deg  POSTURE:  Out toeing, normal arch  PALPATION: TTP arch of L foot  LOWER EXTREMITY ROM:   Active ROM Right eval Left eval  Hip flexion    Hip extension    Hip abduction    Hip adduction    Hip internal rotation    Hip external rotation    Knee flexion    Knee extension    Ankle dorsiflexion 7 knee extended 7 knee extended  Ankle plantarflexion    Ankle inversion    Ankle eversion     (Blank rows = not tested)  LOWER EXTREMITY MMT:  MMT Right eval Left eval  Hip flexion 4+ 4+  Hip extension 4+ 4+  Hip abduction 5 5  Hip adduction 5 5  Hip internal rotation 5 5  Hip external  rotation 5 5  Knee flexion 5 5  Knee extension 5 5  Ankle dorsiflexion 5 5  Ankle plantarflexion 5 5  Ankle inversion 5 5  Ankle eversion 5 5   (Blank rows = not tested)  LOWER EXTREMITY SPECIAL TESTS:  NT  FUNCTIONAL TESTS:  2 minute walk test: TBA SLS 8 sec   GAIT: Distance walked: 200' Assistive device utilized: None Level of assistance: Complete Independence Comments: Decreased push off L when barefooted  TREATMENT DATE:  [facility] Adult PT Treatment:                                                DATE: 07/22/23 Therapeutic Exercise: Bilat heel raise 3x10 c 2" box Gastroc stretch bilateral  Seated toe scrunches Seated toe Yoga Seated plantar fascia and hamstring stretch x2 30' c pillow case Seated self passive Pfascia stretch from figure 4 Seated hamstring stretch 2x 30" Updated HEP Therapeutic Activity: SLS on airex Lateral step ups/downs c airex on 2" box 2x10  [facility] Adult PT Treatment:                                                DATE: 07/11/23 Therapeutic Exercise: Bilat heel raise 2 x 20- edge of towel roll  Gastroc stretch bilateral  Seated toe scrunches Seated toe Yoga Seated plantar fascia stretch active and  Seated self passive Pfascia stretch from figure 4  Therapeutic Activity: 2 MWT  SLS 8 sec Left SLS 20 sec right Self Care: Ice bottle rolling along plantar surface, seated (performed in clinic)  x 2 minutes Self plantar fascia massage from figure 4 position Modalities: Ice pack plantar surface x 5 minutes    [facility] Adult PT Treatment:                                                DATE: 07/06/23 Therapeutic Exercise: Developed, instructed in, and pt completed therex as noted in HEP  Self Care: Anticipated course of care    PATIENT EDUCATION:  Education details: Eval findings, POC, HEP, self care  Person educated:  Patient Education method: Explanation, Demonstration, Tactile cues, Verbal cues, and Handouts Education comprehension: verbalized understanding, returned demonstration, verbal cues required, and tactile cues required  HOME EXERCISE PROGRAM: Access Code: [email or web address] URL: [email or web address] Date: 07/22/2023 Prepared by: Joellyn Rued  Exercises - Long Sitting Calf Stretch with Strap  - 1 x daily - 7 x weekly - 1 sets - 5 reps - 20 hold - Gastroc Stretch on Wall  - 1 x daily - 7 x weekly - 1 sets - 3 reps - 30 hold - Seated Plantar Fascia Stretch  - 1 x daily - 7 x weekly - 1 sets - 1-3 reps - 30 hold - Seated Hamstring Stretch with Strap  - 1 x daily - 7 x weekly - 3 sets - 10 reps - Heel Raises with Counter Support  - 1 x daily - 7 x weekly - 3 sets - 10 reps - 3 hold - Single Leg Stance  - 1 x daily - 7 x weekly - 1 sets - 3 reps - 30 hold - Toe Yoga - Alternating Great Toe and Lesser Toe Extension  - 1 x daily - 7 x weekly - 2 sets - 20 reps - Seated Toe Towel Scrunches  - 1 x daily - 7 x weekly - 2 sets - 10 reps  ASSESSMENT:  CLINICAL IMPRESSION: PT was completed for L LE flexibility c focus on the heel cord and plantar fascia, and for strengthening to promote tissue remodeling.  Pt was advised to complete strengthening therex with HEP every other day for recovery. Response to PT is appropriate. Pt will continue to benefit from skilled PT to address impairments for improved function. Consider manual therapy with future sessions.   EVAL: Patient is a 63 y.o. male who was seen today for physical therapy evaluation and treatment for M72.2 (ICD-10-CM) - Plantar fasciitis, left . Pt presents with signs and symptoms consistent with the referring Dx. Pt has decreased ankle DF AROM, markedly decreased hamstring flexibility, and is significantly TTP of the arch of the L foot. A HEP for stretching, and strengthening (tissue remodeling) was initiated. Pt will benefit from skilled PT 2w6  to address impairments to optimize L foot function with less pain.   OBJECTIVE IMPAIRMENTS: decreased activity tolerance, difficulty walking, decreased ROM, increased fascial restrictions, obesity, and pain.   ACTIVITY LIMITATIONS: carrying, lifting, standing, stairs, and locomotion level  PARTICIPATION LIMITATIONS: meal prep, cleaning, shopping, community activity, and occupation  PERSONAL FACTORS: Past/current experiences and 1 comorbidity: high BMI  are also affecting patient's functional outcome.   REHAB POTENTIAL: Good  CLINICAL DECISION MAKING: Stable/uncomplicated  EVALUATION COMPLEXITY: Low   GOALS:  LONG TERM GOALS: Target date: 08/26/23  Pt will be Ind in a final HEP to maintain achieved LOF  Baseline: initiated Goal status: INITIAL  2.  Increase L ankle DF AROM c ext knee to 12d or greater to minimize plantar fascia strain Baseline: 7d Goal status: INITIAL  3.  Increase L hamstring flexibility to 40d or greater to minimize posterior LE muscular strain Baseline: 30d Goal status: INITIAL  4.  Pt will report a 50% or greater improvement in L foot pain for improved function and QOL Baseline: 3/10 ave; 1-8/10 Goal status: INITIAL  5.  .Improve by MCID of 57ft as indication of improved functional mobility if pt scores below standard for age Baseline: TBA Goal status: INITIAL   PLAN:  PT FREQUENCY: 2x/week  PT DURATION: 6 weeks  PLANNED INTERVENTIONS: 97164- PT Re-evaluation, 97110-Therapeutic exercises, 97530- Therapeutic activity, [id number]- Neuromuscular re-education, 97535- Self Care, 16109- Manual therapy, [id number]- Gait training, [phone]- Electrical stimulation (unattended), [id number]- Electrical stimulation (manual), [id number]- Ionotophoresis 4mg /ml Dexamethasone, Patient/Family education, Taping, Dry Needling, Joint mobilization, Cryotherapy, and Moist heat  PLAN FOR NEXT SESSION: Assess ; assess response to HEP; progress therex as indicated; use of modalities,  manual therapy; and TPDN as indicated.  Jazz Rogala MS, PT 07/22/23 10:32 AM

## 2023-07-22 ENCOUNTER — Ambulatory Visit: Payer: 59 | Attending: Family Medicine

## 2023-07-22 DIAGNOSIS — R262 Difficulty in walking, not elsewhere classified: Secondary | ICD-10-CM | POA: Insufficient documentation

## 2023-07-22 DIAGNOSIS — M79672 Pain in left foot: Secondary | ICD-10-CM | POA: Insufficient documentation

## 2023-07-22 DIAGNOSIS — M25672 Stiffness of left ankle, not elsewhere classified: Secondary | ICD-10-CM | POA: Insufficient documentation

## 2023-07-25 ENCOUNTER — Ambulatory Visit: Payer: 59 | Admitting: Physical Therapy

## 2023-07-25 ENCOUNTER — Encounter: Payer: Self-pay | Admitting: Physical Therapy

## 2023-07-25 DIAGNOSIS — M79672 Pain in left foot: Secondary | ICD-10-CM | POA: Diagnosis not present

## 2023-07-25 DIAGNOSIS — M25672 Stiffness of left ankle, not elsewhere classified: Secondary | ICD-10-CM

## 2023-07-25 NOTE — Therapy (Unsigned)
 OUTPATIENT PHYSICAL THERAPY TREATMENT    Patient Name: Zachary Graham MRN: [id number] DOB:Dec 30, 1960, 63 y.o., male Today's Date: 07/25/2023  END OF SESSION:  PT End of Session - 07/25/23 1319     Visit Number 4    Number of Visits 13    Date for PT Re-Evaluation 08/26/23    Authorization Type [place] AET[employer] SAVE    PT Start Time PT end time Total  1317  1400 43             Past Medical History:  Diagnosis Date   Diabetes mellitus    GERD (gastroesophageal reflux disease)    Heart murmur    Hyperlipidemia    Hypertension    OSA (obstructive sleep apnea)    Pneumonia    Past Surgical History:  Procedure Laterality Date   CERVICAL DISC ARTHROPLASTY N/A 03/16/2017   Procedure: CERVICAL FIVE- CERVICAL SIX DISC ARTHROPLASTY;  Surgeon: Shirlean Kelly, MD;  Location: [facility] OR;  Service: Neurosurgery;  Laterality: N/A;  CERVICAL 5- CERVICAL 6 DISC ARTHROPLASTY   COLONOSCOPY     TONSILLECTOMY     Patient Active Problem List   Diagnosis Date Noted   Left ankle pain 06/22/2023   Left rib fracture 11/26/2022   Right wrist pain 11/26/2022   Preglaucoma, unspecified, bilateral 01/14/2022   Seborrheic keratosis 01/14/2022   Type 1 diabetes mellitus with hyperglycemia (HCC) 01/14/2022   Vasomotor rhinitis 01/14/2022   Adult attention deficit hyperactivity disorder 01/14/2022   Allergy to shellfish 01/14/2022   Dysphagia, oropharyngeal phase 01/14/2022   Gastro-esophageal reflux disease without esophagitis 01/14/2022   Low back pain 10/29/2021   Iron deficiency anemia secondary to inadequate dietary iron intake 02/02/2021   Frequent PVCs 10/28/2020   Hypertension    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) 03/20/2018   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 diabetes mellitus (HCC) 08/31/2017   Mild cataract of right eye 08/31/2017   HNP (herniated nucleus pulposus), cervical  03/16/2017   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 (HCC) 01/06/2017   Herniation of cervical intervertebral disc with radiculopathy 01/04/2017   Current mild episode of major depressive disorder without prior episode (HCC) 12/02/2016   Diabetic neuropathy (HCC) 03/01/2016   Obesity, Class II, BMI 35.0-39.9, with comorbidity (see actual BMI) 04/24/2015   Type 1 diabetes mellitus with hyperglycemia, with long-term current use of insulin (HCC) 04/04/2015   Patellofemoral stress syndrome of left knee 03/19/2015   Allergic rhinitis due to pollen 11/14/2014   Equinus deformity of foot, acquired 05/29/2014   ADHD (attention deficit hyperactivity disorder), inattentive type 10/24/2013   Vitamin D deficiency 02/28/2013   Pulmonic stenosis 05/25/2012   Pure hyperglyceridemia 05/25/2012   Hyperlipidemia with target LDL less than 100 09/20/2011   OSA (obstructive sleep apnea) 09/20/2011   Routine general medical examination at a health care facility 09/20/2011    PCP: [name], MD   REFERRING PROVIDER: Judi Saa, DO  REFERRING DIAG: M72.2 (ICD-10-CM) - Plantar fasciitis, left   THERAPY DIAG:  Pain in left foot  Decreased range of motion of left ankle  Rationale for Evaluation and Treatment: Rehabilitation  ONSET DATE: 9 weeks  SUBJECTIVE:   SUBJECTIVE STATEMENT: 3-4/10 with walking.   EVAL: Pt reports an insidious onset of L foot pain. Pt notes using a carbon fiber insert, as well as orthotic insert together, have helped to make walking more comfortable. Pt states  after experiencing increases in L foot pain, rest alone will lower the pain to a very low ache in a short time frame.  PERTINENT HISTORY: High BMI; See PMH  PAIN:  Are you having pain? Yes: NPRS scale: 3-4/10: pain range the week prior to start of PT: 1-8/10 Pain location: Ache of the foot Pain description: Sharp, ache Aggravating factors: Prolonged walking and  certain movements Relieving factors: Rest  PRECAUTIONS: None  RED FLAGS: None   WEIGHT BEARING RESTRICTIONS: No  FALLS:  Has patient fallen in last 6 months? 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 Katrinka Blazing 3/19  OBJECTIVE:  Note: Objective measures were completed at Evaluation unless otherwise noted.  DIAGNOSTIC FINDINGS:  L ankle DG 06/22/23 FINDINGS: There is no evidence of fracture, dislocation, or joint effusion. There is no evidence of arthropathy or other focal bone abnormality. Soft tissues are unremarkable.   IMPRESSION: Negative.  PATIENT SURVEYS:  LEFS 42/80- Mild to mod functional limitation  COGNITION: Overall cognitive status: Within functional limits for tasks assessed     SENSATION: WFL  EDEMA:  None observed or palpated  MUSCLE LENGTH: Hamstrings: Right 30 deg; Left 30 deg Thomas test: Right tight deg; Left tight deg  POSTURE:  Out toeing, normal arch  PALPATION: TTP arch of L foot  LOWER EXTREMITY ROM:   Active ROM Right eval Left eval Right  07/25/23 Left  07/25/23  Hip flexion      Hip extension      Hip abduction      Hip adduction      Hip internal rotation      Hip external rotation      Knee flexion      Knee extension      Ankle dorsiflexion 7 knee extended 7 knee extended 5 2  Ankle plantarflexion      Ankle inversion      Ankle eversion       (Blank rows = not tested)  LOWER EXTREMITY MMT:  MMT Right eval Left eval  Hip flexion 4+ 4+  Hip extension 4+ 4+  Hip abduction 5 5  Hip adduction 5 5  Hip internal rotation 5 5  Hip external rotation 5 5  Knee flexion 5 5  Knee extension 5 5  Ankle dorsiflexion 5 5  Ankle plantarflexion 5 5  Ankle inversion 5 5  Ankle eversion 5 5   (Blank rows = not tested)  LOWER EXTREMITY SPECIAL TESTS:   NT  FUNCTIONAL TESTS:  2 minute walk test: TBA SLS 8 sec   GAIT: Distance walked: 200' Assistive device utilized: None Level of assistance: Complete Independence Comments: Decreased push off L when barefooted  TREATMENT DATE:  [facility] Adult PT Treatment:                                                DATE: 07/25/23 Therapeutic Exercise: Supine h/s stretches with strap  3 each for 30 sec  Standing gastroc stretches 20 sec  DF mobs knee to counter Bilateral heel raises x 20  Manual Therapy: STW strumming to plantar fascia mod pressure  Modalities: Ionto patch to plantar fascia 1ml dexamethasone - 4-6 hour wear time.     [facility] Adult PT Treatment:                                                DATE: 07/22/23 Therapeutic Exercise: Bilat heel raise 3x10 c 2" box Gastroc stretch bilateral  Seated toe scrunches Seated toe Yoga Seated plantar fascia and hamstring stretch x2 30' c pillow case Seated self passive Pfascia stretch from figure 4 Seated hamstring stretch 2x 30" Updated HEP Therapeutic Activity: SLS on airex Lateral step ups/downs c airex on 2" box 2x10  [facility] Adult PT Treatment:                                                DATE: 07/11/23 Therapeutic Exercise: Bilat heel raise 2 x 20- edge of towel roll  Gastroc stretch bilateral  Seated toe scrunches Seated toe Yoga Seated plantar fascia stretch active and  Seated self passive Pfascia stretch from figure 4  Therapeutic Activity: 2 MWT  SLS 8 sec Left SLS 20 sec right Self Care: Ice bottle rolling along plantar surface, seated (performed in clinic)  x 2 minutes Self plantar fascia massage from figure 4 position Modalities: Ice pack plantar surface x 5 minutes    [facility] Adult PT Treatment:                                                DATE: 07/06/23 Therapeutic Exercise: Developed, instructed  in, and pt completed therex as noted in HEP  Self Care: Anticipated course of care    PATIENT EDUCATION:  Education details: Eval findings, POC, HEP, self care  Person educated: Patient Education method: Explanation, Demonstration, Tactile cues, Verbal cues, and Handouts Education comprehension: verbalized understanding, returned demonstration, verbal cues required, and tactile cues required  HOME EXERCISE PROGRAM: Access Code: [email or web address] URL: [email or web address] Date: 07/22/2023 Prepared by: Joellyn Rued  Exercises - Long Sitting Calf Stretch with Strap  - 1 x daily - 7 x weekly - 1 sets - 5 reps - 20 hold - Gastroc Stretch on Wall  - 1 x daily - 7 x weekly - 1 sets - 3 reps - 30 hold - Seated Plantar Fascia Stretch  - 1 x daily - 7 x weekly - 1 sets - 1-3 reps - 30 hold - Seated Hamstring Stretch with Strap  - 1 x daily - 7 x weekly - 3 sets - 10 reps - Heel Raises with Counter Support  - 1  x daily - 7 x weekly - 3 sets - 10 reps - 3 hold - Single Leg Stance  - 1 x daily - 7 x weekly - 1 sets - 3 reps - 30 hold - Toe Yoga - Alternating Great Toe and Lesser Toe Extension  - 1 x daily - 7 x weekly - 2 sets - 20 reps - Seated Toe Towel Scrunches  - 1 x daily - 7 x weekly - 2 sets - 10 reps  ASSESSMENT:  CLINICAL IMPRESSION: Trial of iontophoresis with dexamethasone to address pain and inflammation of plantar fascia. Supine DF AROM appears decreased, however closed chain DF appears WFL. Reviewed stretching technique for hamstrings and calves encouraging at least 30 sec hold times which he has not been completing. He does have improvement in hamstring length, although still very tight.  Pt will continue to benefit from skilled PT to address impairments for improved function. Consider manual therapy with future sessions.   EVAL: Patient is a 63 y.o. male who was seen today for physical therapy evaluation and treatment for M72.2 (ICD-10-CM) - Plantar fasciitis, left . Pt  presents with signs and symptoms consistent with the referring Dx. Pt has decreased ankle DF AROM, markedly decreased hamstring flexibility, and is significantly TTP of the arch of the L foot. A HEP for stretching, and strengthening (tissue remodeling) was initiated. Pt will benefit from skilled PT 2w6 to address impairments to optimize L foot function with less pain.   OBJECTIVE IMPAIRMENTS: decreased activity tolerance, difficulty walking, decreased ROM, increased fascial restrictions, obesity, and pain.   ACTIVITY LIMITATIONS: carrying, lifting, standing, stairs, and locomotion level  PARTICIPATION LIMITATIONS: meal prep, cleaning, shopping, community activity, and occupation  PERSONAL FACTORS: Past/current experiences and 1 comorbidity: high BMI  are also affecting patient's functional outcome.   REHAB POTENTIAL: Good  CLINICAL DECISION MAKING: Stable/uncomplicated  EVALUATION COMPLEXITY: Low   GOALS:  LONG TERM GOALS: Target date: 08/26/23  Pt will be Ind in a final HEP to maintain achieved LOF  Baseline: initiated Goal status: INITIAL  2.  Increase L ankle DF AROM c ext knee to 12d or greater to minimize plantar fascia strain Baseline: 7d Goal status: INITIAL  3.  Increase L hamstring flexibility to 40d or greater to minimize posterior LE muscular strain Baseline: 30d 07/25/23: 35d right, 50 deg  Goal status: INITIAL  4.  Pt will report a 50% or greater improvement in L foot pain for improved function and QOL Baseline: 3/10 ave; 1-8/10 Goal status: INITIAL  5.  .Improve by MCID of 30ft as indication of improved functional mobility if pt scores below standard for age Baseline: TBA Goal status: INITIAL   PLAN:  PT FREQUENCY: 2x/week  PT DURATION: 6 weeks  PLANNED INTERVENTIONS: 97164- PT Re-evaluation, 97110-Therapeutic exercises, 97530- Therapeutic activity, [id number]- Neuromuscular re-education, 97535- Self Care, 95621- Manual therapy, [id number]- Gait training,  [phone]- Electrical stimulation (unattended), [id number]- Electrical stimulation (manual), [id number]- Ionotophoresis 4mg /ml Dexamethasone, Patient/Family education, Taping, Dry Needling, Joint mobilization, Cryotherapy, and Moist heat  PLAN FOR NEXT SESSION: Assess ; assess response to HEP; progress therex as indicated; use of modalities, manual therapy; and TPDN as indicated.  Jannette Spanner, PTA 07/25/23 1:20 PM Phone: [phone] Fax: [phone]

## 2023-07-26 NOTE — Progress Notes (Unsigned)
 Electrophysiology Office Note:    Date:  07/27/2023   ID:  ARSHAWN VALDEZ, DOB 1960-06-13, MRN [id number]  [facility] Cardiologist:  Kristeen Miss, MD  [facility] Electrophysiologist:  Lanier Prude, MD   Referring MD: Gaston Islam., NP   Chief Complaint: PVCs  History of Present Illness:    Mr. Zachary Graham is a 63 year old man who I am seeing today for an evaluation of PVCs at the request of Robin Searing, NP.  The patient has a history of hypertension, hyperlipidemia and GERD.  He also has a history of pulmonic stenosis, sleep apnea and diabetes.  He saw Alden Server June 17, 2023.  He reported PVCs and palpitations at that appointment.  The PVCs were present since he was a child.  At that appointment he endorsed a high level of caffeine intake.  He also uses Adderall.  An EKG demonstrated frequent PVCs.  A 3-day ZIO monitor was ordered.  He reports an irregular heart rhythm since childhood. No symptoms. He is active. Works as a Actor at [employer].    Their past medical, social and family history was reviewed.   ROS:   Please see the history of present illness.    All other systems reviewed and are negative.  EKGs/Labs/Other Studies Reviewed:    The following studies were reviewed today:  June 30, 2023 ZIO monitor personally reviewed Frequent PVCs, 20%  June 17, 2023 EKG shows sinus rhythm with bigeminal PVCs.  PVCs have an inferior axis.  QS in aVR.  Biphasic in aVL.  Precordial transition in V3 which mimics the precordial transition of the sinus QRS.  November 01, 2022 echo EF 55-60 RV normal Trivial MR Mild to moderately dilated left atrium Mild pulmonic stenosis EKG Interpretation Date/Time:  Wednesday July 27 2023 09:14:00 EDT Ventricular Rate:  78 PR Interval:  264 QRS Duration:  96 QT Interval:  386 QTC Calculation: 440 R Axis:   2  Text Interpretation: Sinus rhythm with 1st degree A-V block with occasional Premature ventricular  complexes Confirmed by Steffanie Dunn [phone]) on 07/27/2023 9:43:45 AM    Physical Exam:    VS:  BP (!) 96/52   Pulse 78   Ht 5\' 9"  (1.753 m)   Wt 221 lb (100.2 kg)   SpO2 96%   BMI 32.64 kg/m     Wt Readings from Last 3 Encounters:  07/27/23 221 lb (100.2 kg)  06/22/23 224 lb (101.6 kg)  06/17/23 225 lb 6.4 oz (102.2 kg)     GEN: no distress CARD: irregular rhythm, No MRG RESP: No IWOB. CTAB.        ASSESSMENT AND PLAN:    1. 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  Morphology appears to be consistent with an outflow tract origin.  Currently on metoprolol succinate 25 mg by mouth once daily. Does have some fatigue with metoprolol.  I discussed pathophysiology of PVC's during the visit today. Discussed treatment options. Given asymptomatic nature of his PVC's and normal EF, I do not think suppression is indicated. Recommend reducing his metoprolol to 12.5mg  PO daily to help his fatigue and relatively low BP's.   #Hypertension At goal today.  Recommend checking blood pressures 1-2 times per week at home and recording the values.  Recommend bringing these recordings to the primary care physician. Reduce metoprolol as above.  Follow up with EP on an as needed basis.        Signed,  Sheria Lang T. Lalla Brothers, MD, [facility], [facility] 07/27/2023 9:55 AM    Electrophysiology [facility]

## 2023-07-27 ENCOUNTER — Other Ambulatory Visit (HOSPITAL_COMMUNITY): Payer: Self-pay

## 2023-07-27 ENCOUNTER — Ambulatory Visit: Payer: 59 | Attending: Cardiology | Admitting: Cardiology

## 2023-07-27 ENCOUNTER — Encounter: Payer: Self-pay | Admitting: Cardiology

## 2023-07-27 VITALS — BP 96/52 | HR 78 | Ht 69.0 in | Wt 221.0 lb

## 2023-07-27 DIAGNOSIS — I493 Ventricular premature depolarization: Secondary | ICD-10-CM | POA: Diagnosis not present

## 2023-07-27 MED ORDER — METOPROLOL SUCCINATE ER 25 MG PO TB24
12.5000 mg | ORAL_TABLET | Freq: Every day | ORAL | 3 refills | Status: DC
  Filled 2023-07-27 – 2023-09-07 (×8): qty 45, 90d supply, fill #0

## 2023-07-27 NOTE — Patient Instructions (Addendum)
 Medication Instructions:  Your physician has recommended you make the following change in your medication:  1) DECREASE metoprolol succinate to 12.5 mg daily *If you need a refill on your cardiac medications before your next appointment, please call your pharmacy*  Follow-Up: At [facility], you and your health needs are our priority.  As part of our continuing mission to provide you with exceptional heart care, we have created designated Provider Care Teams.  These Care Teams include your primary Cardiologist (physician) and Advanced Practice Providers (APPs -  Physician Assistants and Nurse Practitioners) who all work together to provide you with the care you need, when you need it.  Your next appointment:   As needed with Dr. Lalla Brothers

## 2023-07-28 ENCOUNTER — Other Ambulatory Visit (HOSPITAL_COMMUNITY): Payer: Self-pay

## 2023-07-28 ENCOUNTER — Encounter: Payer: Self-pay | Admitting: Physical Therapy

## 2023-07-28 ENCOUNTER — Ambulatory Visit: Payer: 59 | Admitting: Physical Therapy

## 2023-07-28 DIAGNOSIS — R262 Difficulty in walking, not elsewhere classified: Secondary | ICD-10-CM

## 2023-07-28 DIAGNOSIS — M25672 Stiffness of left ankle, not elsewhere classified: Secondary | ICD-10-CM

## 2023-07-28 DIAGNOSIS — M79672 Pain in left foot: Secondary | ICD-10-CM | POA: Diagnosis not present

## 2023-07-28 NOTE — Therapy (Signed)
 OUTPATIENT PHYSICAL THERAPY TREATMENT    Patient Name: Zachary Graham MRN: [id number] DOB:Mar 04, 1961, 63 y.o., male Today's Date: 07/28/2023  END OF SESSION:  PT End of Session - 07/25/23 1319     Visit Number 4    Number of Visits 13    Date for PT Re-Evaluation 08/26/23    Authorization Type [place] AETNA SAVE    PT Start Time PT end time Total  1317  1400 43             Past Medical History:  Diagnosis Date   Diabetes mellitus    GERD (gastroesophageal reflux disease)    Heart murmur    Hyperlipidemia    Hypertension    OSA (obstructive sleep apnea)    Pneumonia    Past Surgical History:  Procedure Laterality Date   CERVICAL DISC ARTHROPLASTY N/A 03/16/2017   Procedure: CERVICAL FIVE- CERVICAL SIX DISC ARTHROPLASTY;  Surgeon: Shirlean Kelly, MD;  Location: [facility] OR;  Service: Neurosurgery;  Laterality: N/A;  CERVICAL 5- CERVICAL 6 DISC ARTHROPLASTY   COLONOSCOPY     TONSILLECTOMY     Patient Active Problem List   Diagnosis Date Noted   Left ankle pain 06/22/2023   Left rib fracture 11/26/2022   Right wrist pain 11/26/2022   Preglaucoma, unspecified, bilateral 01/14/2022   Seborrheic keratosis 01/14/2022   Type 1 diabetes mellitus with hyperglycemia (HCC) 01/14/2022   Vasomotor rhinitis 01/14/2022   Adult attention deficit hyperactivity disorder 01/14/2022   Allergy to shellfish 01/14/2022   Dysphagia, oropharyngeal phase 01/14/2022   Gastro-esophageal reflux disease without esophagitis 01/14/2022   Low back pain 10/29/2021   Iron deficiency anemia secondary to inadequate dietary iron intake 02/02/2021   Frequent PVCs 10/28/2020   Hypertension    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) 03/20/2018   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 diabetes mellitus (HCC) 08/31/2017   Mild cataract of right eye 08/31/2017   HNP (herniated nucleus pulposus), cervical  03/16/2017   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 (HCC) 01/06/2017   Herniation of cervical intervertebral disc with radiculopathy 01/04/2017   Current mild episode of major depressive disorder without prior episode (HCC) 12/02/2016   Diabetic neuropathy (HCC) 03/01/2016   Obesity, Class II, BMI 35.0-39.9, with comorbidity (see actual BMI) 04/24/2015   Type 1 diabetes mellitus with hyperglycemia, with long-term current use of insulin (HCC) 04/04/2015   Patellofemoral stress syndrome of left knee 03/19/2015   Allergic rhinitis due to pollen 11/14/2014   Equinus deformity of foot, acquired 05/29/2014   ADHD (attention deficit hyperactivity disorder), inattentive type 10/24/2013   Vitamin D deficiency 02/28/2013   Pulmonic stenosis 05/25/2012   Pure hyperglyceridemia 05/25/2012   Hyperlipidemia with target LDL less than 100 09/20/2011   OSA (obstructive sleep apnea) 09/20/2011   Routine general medical examination at a health care facility 09/20/2011    PCP: [name], MD   REFERRING PROVIDER: Judi Saa, DO  REFERRING DIAG: M72.2 (ICD-10-CM) - Plantar fasciitis, left   THERAPY DIAG:  Pain in left foot  Decreased range of motion of left ankle  Difficulty in walking, not elsewhere classified  Rationale for Evaluation and Treatment: Rehabilitation  ONSET DATE: 9 weeks  SUBJECTIVE:   SUBJECTIVE STATEMENT: 4/10 with walking. The patch helped for the rest of the day last time.   EVAL: Pt reports an insidious onset of L foot pain. Pt notes using a  carbon fiber insert, as well as orthotic insert together, have helped to make walking more comfortable. Pt states after experiencing increases in L foot pain, rest alone will lower the pain to a very low ache in a short time frame.  PERTINENT HISTORY: High BMI; See PMH  PAIN:  Are you having pain? Yes: NPRS scale: 3-4/10: pain range the week prior to start of PT: 1-8/10 Pain  location: Ache of the foot Pain description: Sharp, ache Aggravating factors: Prolonged walking and certain movements Relieving factors: Rest  PRECAUTIONS: None  RED FLAGS: None   WEIGHT BEARING RESTRICTIONS: No  FALLS:  Has patient fallen in last 6 months? 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 Katrinka Blazing 3/19  OBJECTIVE:  Note: Objective measures were completed at Evaluation unless otherwise noted.  DIAGNOSTIC FINDINGS:  L ankle DG 06/22/23 FINDINGS: There is no evidence of fracture, dislocation, or joint effusion. There is no evidence of arthropathy or other focal bone abnormality. Soft tissues are unremarkable.   IMPRESSION: Negative.  PATIENT SURVEYS:  LEFS 42/80- Mild to mod functional limitation  COGNITION: Overall cognitive status: Within functional limits for tasks assessed     SENSATION: WFL  EDEMA:  None observed or palpated  MUSCLE LENGTH: Hamstrings: Right 30 deg; Left 30 deg Thomas test: Right tight deg; Left tight deg  POSTURE:  Out toeing, normal arch  PALPATION: TTP arch of L foot  LOWER EXTREMITY ROM:   Active ROM Right eval Left eval Right  07/25/23 Left  07/25/23  Hip flexion      Hip extension      Hip abduction      Hip adduction      Hip internal rotation      Hip external rotation      Knee flexion      Knee extension      Ankle dorsiflexion 7 knee extended 7 knee extended 5 2  Ankle plantarflexion      Ankle inversion      Ankle eversion       (Blank rows = not tested)  LOWER EXTREMITY MMT:  MMT Right eval Left eval  Hip flexion 4+ 4+  Hip extension 4+ 4+  Hip abduction 5 5  Hip adduction 5 5  Hip internal rotation 5 5  Hip external rotation 5 5  Knee flexion 5 5  Knee extension 5 5  Ankle dorsiflexion 5 5  Ankle plantarflexion 5 5  Ankle  inversion 5 5  Ankle eversion 5 5   (Blank rows = not tested)  LOWER EXTREMITY SPECIAL TESTS:  NT  FUNCTIONAL TESTS:  2 minute walk test: TBA SLS 8 sec  07/28/23  SLS Left 25 sec , SLS right 60  GAIT: Distance walked: 200' Assistive device utilized: None Level of assistance: Complete Independence Comments: Decreased push off L when barefooted  TREATMENT DATE:  [facility] Adult PT Treatment:                                                DATE: 07/28/23 Therapeutic Exercise: Supine h/s stretch x 3 each  Slant board stretch gastroc stretch / soleus stretch Slant board board heel raise x 8 Seated Marble pick up x 20 Seated toe extension AROM  Modalities: Ionto patch to plantar fascia 1ml dexamethasone - 4-6 hour wear time Ice pack to bottom of foot x 5 minutes , hooklying     [facility] Adult PT Treatment:                                                DATE: 07/25/23 Therapeutic Exercise: Supine h/s stretches with strap  3 each for 30 sec  Standing gastroc stretches 20 sec  DF mobs knee to counter Bilateral heel raises x 20  Manual Therapy: STW strumming to plantar fascia mod pressure  Modalities: Ionto patch to plantar fascia 1ml dexamethasone - 4-6 hour wear time.     [facility] Adult PT Treatment:                                                DATE: 07/22/23 Therapeutic Exercise: Bilat heel raise 3x10 c 2" box Gastroc stretch bilateral  Seated toe scrunches Seated toe Yoga Seated plantar fascia and hamstring stretch x2 30' c pillow case Seated self passive Pfascia stretch from figure 4 Seated hamstring stretch 2x 30" Updated HEP Therapeutic Activity: SLS on airex Lateral step ups/downs c airex on 2" box 2x10  [facility] Adult PT Treatment:                                                DATE: 07/11/23 Therapeutic Exercise: Bilat heel raise 2 x 20- edge of towel roll   Gastroc stretch bilateral  Seated toe scrunches Seated toe Yoga Seated plantar fascia stretch active and  Seated self passive Pfascia stretch from figure 4  Therapeutic Activity: 2 MWT  SLS 8 sec Left SLS 20 sec right Self Care: Ice bottle rolling along plantar surface, seated (performed in clinic)  x 2 minutes Self plantar fascia massage from figure 4 position Modalities: Ice pack plantar surface x 5 minutes    [facility] Adult PT Treatment:                                                DATE: 07/06/23 Therapeutic Exercise: Developed, instructed in, and pt completed therex as noted in HEP  Self Care: Anticipated course of care    PATIENT EDUCATION:  Education details: Eval findings, POC, HEP, self care  Person educated: Patient Education method: Explanation, Demonstration, Tactile cues, Verbal cues, and Handouts Education comprehension: verbalized understanding, returned demonstration, verbal cues required, and tactile cues required  HOME  EXERCISE PROGRAM: Access Code: [email or web address] URL: [email or web address] Date: 07/22/2023 Prepared by: Joellyn Rued  Exercises - Long Sitting Calf Stretch with Strap  - 1 x daily - 7 x weekly - 1 sets - 5 reps - 20 hold - Gastroc Stretch on Wall  - 1 x daily - 7 x weekly - 1 sets - 3 reps - 30 hold - Seated Plantar Fascia Stretch  - 1 x daily - 7 x weekly - 1 sets - 1-3 reps - 30 hold - Seated Hamstring Stretch with Strap  - 1 x daily - 7 x weekly - 3 sets - 10 reps - Heel Raises with Counter Support  - 1 x daily - 7 x weekly - 3 sets - 10 reps - 3 hold - Single Leg Stance  - 1 x daily - 7 x weekly - 1 sets - 3 reps - 30 hold - Toe Yoga - Alternating Great Toe and Lesser Toe Extension  - 1 x daily - 7 x weekly - 2 sets - 20 reps - Seated Toe Towel Scrunches  - 1 x daily - 7 x weekly - 2 sets - 10 reps  ASSESSMENT:  CLINICAL IMPRESSION: Pt reports ionto patch was beneficial. He is sore this morning from his exercises yesterday. His  SLS time has improved. Continued with stretching focus. Added slant board heel raises today. Repeated iontophoresis to plantar surface. Also reviewed ice application to bottom of foot and encouraged frequent application to control inflammation.  Pt will continue to benefit from skilled PT to address impairments for improved function. Consider manual therapy with future sessions.   EVAL: Patient is a 63 y.o. male who was seen today for physical therapy evaluation and treatment for M72.2 (ICD-10-CM) - Plantar fasciitis, left . Pt presents with signs and symptoms consistent with the referring Dx. Pt has decreased ankle DF AROM, markedly decreased hamstring flexibility, and is significantly TTP of the arch of the L foot. A HEP for stretching, and strengthening (tissue remodeling) was initiated. Pt will benefit from skilled PT 2w6 to address impairments to optimize L foot function with less pain.   OBJECTIVE IMPAIRMENTS: decreased activity tolerance, difficulty walking, decreased ROM, increased fascial restrictions, obesity, and pain.   ACTIVITY LIMITATIONS: carrying, lifting, standing, stairs, and locomotion level  PARTICIPATION LIMITATIONS: meal prep, cleaning, shopping, community activity, and occupation  PERSONAL FACTORS: Past/current experiences and 1 comorbidity: high BMI  are also affecting patient's functional outcome.   REHAB POTENTIAL: Good  CLINICAL DECISION MAKING: Stable/uncomplicated  EVALUATION COMPLEXITY: Low   GOALS:  LONG TERM GOALS: Target date: 08/26/23  Pt will be Ind in a final HEP to maintain achieved LOF  Baseline: initiated Goal status: INITIAL  2.  Increase L ankle DF AROM c ext knee to 12d or greater to minimize plantar fascia strain Baseline: 7d Goal status: INITIAL  3.  Increase L hamstring flexibility to 40d or greater to minimize posterior LE muscular strain Baseline: 30d 07/25/23: 35d right, 50 deg  Goal status: INITIAL  4.  Pt will report a 50% or greater  improvement in L foot pain for improved function and QOL Baseline: 3/10 ave; 1-8/10 Goal status: INITIAL  5.  .Improve by MCID of 50ft as indication of improved functional mobility if pt scores below standard for age Baseline: TBA Goal status: INITIAL   PLAN:  PT FREQUENCY: 2x/week  PT DURATION: 6 weeks  PLANNED INTERVENTIONS: 97164- PT Re-evaluation, 97110-Therapeutic exercises, 97530- Therapeutic activity, [id number]- Neuromuscular re-education, 97535- Self Care, 54098-  Manual therapy, [id number]- Gait training, [phone]- Electrical stimulation (unattended), [id number]- Electrical stimulation (manual), 01027- Ionotophoresis 4mg /ml Dexamethasone, Patient/Family education, Taping, Dry Needling, Joint mobilization, Cryotherapy, and Moist heat  PLAN FOR NEXT SESSION: Assess ; assess response to HEP; progress therex as indicated; use of modalities, manual therapy; and TPDN as indicated.  Jannette Spanner, PTA 07/28/23 8:50 AM Phone: [phone] Fax: [phone]

## 2023-07-28 NOTE — Progress Notes (Signed)
 [name] [address] [place] [address] Phone: [phone] Subjective:   Zachary Graham, am serving as a scribe for Dr. Antoine Primas.  I'm seeing this patient by the request  of:  [name], MD  CC: Back and neck pain follow-up  ONG:[id number]  [name] is a 63 y.o. male coming in with complaint of back and neck pain. OMT 06/22/2023. Also f/u for L ankle pain. Patient states that he has been doing better. Doing PT for plantar fasciitis. Has been stretching a lot at home as well.   Medications patient has been prescribed: None          Reviewed prior external information including notes and imaging from previsou exam, outside providers and external EMR if available.   As well as notes that were available from care everywhere and other healthcare systems.  Past medical history, social, surgical and family history all reviewed in electronic medical record.  No pertanent information unless stated regarding to the chief complaint.   Past Medical History:  Diagnosis Date   Diabetes mellitus    GERD (gastroesophageal reflux disease)    Heart murmur    Hyperlipidemia    Hypertension    OSA (obstructive sleep apnea)    Pneumonia     Allergies  Allergen Reactions   Lisinopril Swelling and Other (See Comments)    Extreme facial swelling causing hospitalization   Shellfish Allergy Anaphylaxis   Zetia [Ezetimibe] Other (See Comments)    ABDOMINAL CRAMPING   Dulaglutide Other (See Comments)   Gabapentin     Other reaction(s): Dizziness   Gabapentin (Once-Daily) Other (See Comments)   Iodine    Penicillins Other (See Comments)    UNSPECIFIED REACTION > Childhood allergy     Cymbalta [Duloxetine Hcl] Other (See Comments)    Severe feet cramps     Review of Systems:  No headache, visual changes, nausea, vomiting, diarrhea, constipation, dizziness, abdominal pain, skin rash, fevers, chills, night sweats, weight loss,  swollen lymph nodes, body aches, joint swelling, chest pain, shortness of breath, mood changes. POSITIVE muscle aches  Objective  Blood pressure 128/82, pulse (!) 54, height 5\' 9"  (1.753 m), SpO2 99%.   General: No apparent distress alert and oriented x3 mood and affect normal, dressed appropriately.  HEENT: Pupils equal, extraocular movements intact  Respiratory: Patient's speak in full sentences and does not appear short of breath  Cardiovascular: No lower extremity edema, non tender, no erythema  Gait MSK:  Back does have some loss lordosis noted.  Some tenderness to palpation in the paraspinal musculature.  Tightness noted in the low back especially SI joint. Foot exam shows patient does have a nodule noted on the midfoot on the dorsal aspect.  Osteopathic findings  T9 extended rotated and side bent left L2 flexed rotated and side bent right Sacrum right on right    Assessment and Plan:  No problem-specific Assessment & Plan notes found for this encounter.    Nonallopathic problems  Decision today to treat with OMT was based on Physical Exam  After verbal consent patient was treated with HVLA, ME, FPR techniques in  ribhoracic, lumbar, and sacral  areas  Patient tolerated the procedure well with improvement in symptoms  Patient given exercises, stretches and lifestyle modifications  See medications in patient instructions if given  Patient will follow up in 4-8 weeks     The above documentation has been reviewed and is accurate and complete Earna Coder  Dwan Bolt, DO         Note: This dictation was prepared with Dragon dictation along with smaller phrase technology. Any transcriptional errors that result from this process are unintentional.

## 2023-07-29 ENCOUNTER — Other Ambulatory Visit (HOSPITAL_COMMUNITY): Payer: Self-pay

## 2023-07-30 ENCOUNTER — Other Ambulatory Visit (HOSPITAL_COMMUNITY): Payer: Self-pay

## 2023-08-01 ENCOUNTER — Other Ambulatory Visit (HOSPITAL_COMMUNITY): Payer: Self-pay

## 2023-08-02 ENCOUNTER — Other Ambulatory Visit (HOSPITAL_COMMUNITY): Payer: Self-pay

## 2023-08-03 ENCOUNTER — Other Ambulatory Visit (HOSPITAL_COMMUNITY): Payer: Self-pay

## 2023-08-03 ENCOUNTER — Encounter: Payer: Self-pay | Admitting: Family Medicine

## 2023-08-03 ENCOUNTER — Ambulatory Visit (INDEPENDENT_AMBULATORY_CARE_PROVIDER_SITE_OTHER): Payer: 59 | Admitting: Family Medicine

## 2023-08-03 VITALS — BP 128/82 | HR 54 | Ht 69.0 in

## 2023-08-03 DIAGNOSIS — G8929 Other chronic pain: Secondary | ICD-10-CM

## 2023-08-03 DIAGNOSIS — M9903 Segmental and somatic dysfunction of lumbar region: Secondary | ICD-10-CM | POA: Diagnosis not present

## 2023-08-03 DIAGNOSIS — M9902 Segmental and somatic dysfunction of thoracic region: Secondary | ICD-10-CM | POA: Diagnosis not present

## 2023-08-03 DIAGNOSIS — M545 Low back pain, unspecified: Secondary | ICD-10-CM | POA: Diagnosis not present

## 2023-08-03 DIAGNOSIS — D2120 Benign neoplasm of connective and other soft tissue of unspecified lower limb, including hip: Secondary | ICD-10-CM | POA: Insufficient documentation

## 2023-08-03 DIAGNOSIS — M9904 Segmental and somatic dysfunction of sacral region: Secondary | ICD-10-CM | POA: Diagnosis not present

## 2023-08-03 DIAGNOSIS — I493 Ventricular premature depolarization: Secondary | ICD-10-CM

## 2023-08-03 DIAGNOSIS — D2122 Benign neoplasm of connective and other soft tissue of left lower limb, including hip: Secondary | ICD-10-CM

## 2023-08-03 NOTE — Assessment & Plan Note (Signed)
 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  Will continue to monitor.  Doing better with stretching and formal physical therapy.  Worsening pain ultrasound and potential injection.

## 2023-08-03 NOTE — Assessment & Plan Note (Signed)
 Recently saw cardiology who says there is a fairly intensive PVC load but is asymptomatic.  Decreased metoprolol recently.

## 2023-08-03 NOTE — Patient Instructions (Signed)
 Choline 500mg  daily See you again in 6-8 weeks

## 2023-08-03 NOTE — Assessment & Plan Note (Signed)
 Chronic problem that is secondary to some of the ergonomics with his working position.  Continuing to work on Designer, fashion/clothing.  Discussed icing regimen and home exercises, discussed avoiding certain activities.  Follow-up again in 6 to 8 weeks

## 2023-08-03 NOTE — Therapy (Signed)
 OUTPATIENT PHYSICAL THERAPY TREATMENT    Patient Name: Zachary Graham MRN: [id number] DOB:[birth year], 63 y.o., male Today's Date: 08/04/2023  END OF SESSION:  PT End of Session - 07/25/23 1319     Visit Number 4    Number of Visits 13    Date for PT Re-Evaluation 08/26/23    Authorization Type [place] [employer] SAVE    PT Start Time PT end time Total  1317  1400 43             Past Medical History:  Diagnosis Date   Diabetes mellitus    GERD (gastroesophageal reflux disease)    Heart murmur    Hyperlipidemia    Hypertension    OSA (obstructive sleep apnea)    Pneumonia    Past Surgical History:  Procedure Laterality Date   CERVICAL DISC ARTHROPLASTY N/A 03/16/2017   Procedure: CERVICAL FIVE- CERVICAL SIX DISC ARTHROPLASTY;  Surgeon: Shirlean Kelly, MD;  Location: [facility] OR;  Service: Neurosurgery;  Laterality: N/A;  CERVICAL 5- CERVICAL 6 DISC ARTHROPLASTY   COLONOSCOPY     TONSILLECTOMY     Patient Active Problem List   Diagnosis Date Noted   Fibroma of foot 08/03/2023   Left ankle pain 06/22/2023   Left rib fracture 11/26/2022   Right wrist pain 11/26/2022   Preglaucoma, unspecified, bilateral 01/14/2022   Seborrheic keratosis 01/14/2022   Type 1 diabetes mellitus with hyperglycemia (HCC) 01/14/2022   Vasomotor rhinitis 01/14/2022   Adult attention deficit hyperactivity disorder 01/14/2022   Allergy to shellfish 01/14/2022   Dysphagia, oropharyngeal phase 01/14/2022   Gastro-esophageal reflux disease without esophagitis 01/14/2022   Low back pain 10/29/2021   Iron deficiency anemia secondary to inadequate dietary iron intake 02/02/2021   Frequent PVCs 10/28/2020   Hypertension    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) 03/20/2018   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 diabetes mellitus (HCC) 08/31/2017   Mild cataract of right eye 08/31/2017   HNP (herniated  nucleus pulposus), cervical 03/16/2017   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 (HCC) 01/06/2017   Herniation of cervical intervertebral disc with radiculopathy 01/04/2017   Current mild episode of major depressive disorder without prior episode (HCC) 12/02/2016   Diabetic neuropathy (HCC) 03/01/2016   Obesity, Class II, BMI 35.0-39.9, with comorbidity (see actual BMI) 04/24/2015   Type 1 diabetes mellitus with hyperglycemia, with long-term current use of insulin (HCC) 04/04/2015   Patellofemoral stress syndrome of left knee 03/19/2015   Allergic rhinitis due to pollen 11/14/2014   Equinus deformity of foot, acquired 05/29/2014   ADHD (attention deficit hyperactivity disorder), inattentive type 10/24/2013   Vitamin D deficiency 02/28/2013   Pulmonic stenosis 05/25/2012   Pure hyperglyceridemia 05/25/2012   Hyperlipidemia with target LDL less than 100 09/20/2011   OSA (obstructive sleep apnea) 09/20/2011   Routine general medical examination at a health care facility 09/20/2011    PCP: [name], MD   REFERRING PROVIDER: Judi Saa, DO  REFERRING DIAG: M72.2 (ICD-10-CM) - Plantar fasciitis, left   THERAPY DIAG:  Pain in left foot  Decreased range of motion of left ankle  Difficulty in walking, not elsewhere classified  Rationale for Evaluation and Treatment: Rehabilitation  ONSET DATE: 9 weeks  SUBJECTIVE:   SUBJECTIVE STATEMENT: Pt's reports purchasing a slant board and has been completing stretches with it and his plantar pain is around 95% better.  EVAL: Pt  reports an insidious onset of L foot pain. Pt notes using a carbon fiber insert, as well as orthotic insert together, have helped to make walking more comfortable. Pt states after experiencing increases in L foot pain, rest alone will lower the pain to a very low ache in a short time frame.  PERTINENT HISTORY: High BMI; See PMH  PAIN:  Are you having pain?  Yes: NPRS scale: 3/10: pain range the week prior to start of PT: 1-8/10 Pain location: Ache of the foot Pain description: Sharp, ache Aggravating factors: Prolonged walking and certain movements Relieving factors: Rest  PRECAUTIONS: None  RED FLAGS: None   WEIGHT BEARING RESTRICTIONS: No  FALLS:  Has patient fallen in last 6 months? 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 Katrinka Blazing 3/19  OBJECTIVE:  Note: Objective measures were completed at Evaluation unless otherwise noted.  DIAGNOSTIC FINDINGS:  L ankle DG 06/22/23 FINDINGS: There is no evidence of fracture, dislocation, or joint effusion. There is no evidence of arthropathy or other focal bone abnormality. Soft tissues are unremarkable.   IMPRESSION: Negative.  PATIENT SURVEYS:  LEFS 42/80- Mild to mod functional limitation  COGNITION: Overall cognitive status: Within functional limits for tasks assessed     SENSATION: WFL  EDEMA:  None observed or palpated  MUSCLE LENGTH: Hamstrings: Right 30 deg; Left 30 deg Thomas test: Right tight deg; Left tight deg  POSTURE:  Out toeing, normal arch  PALPATION: TTP arch of L foot  LOWER EXTREMITY ROM:   Active ROM Right eval Left eval Right  07/25/23 Left  07/25/23  Hip flexion      Hip extension      Hip abduction      Hip adduction      Hip internal rotation      Hip external rotation      Knee flexion      Knee extension      Ankle dorsiflexion 7 knee extended 7 knee extended 5 2  Ankle plantarflexion      Ankle inversion      Ankle eversion       (Blank rows = not tested)  LOWER EXTREMITY MMT:  MMT Right eval Left eval  Hip flexion 4+ 4+  Hip extension 4+ 4+  Hip abduction 5 5  Hip adduction 5 5  Hip internal rotation 5 5  Hip external rotation 5 5  Knee flexion 5 5  Knee  extension 5 5  Ankle dorsiflexion 5 5  Ankle plantarflexion 5 5  Ankle inversion 5 5  Ankle eversion 5 5   (Blank rows = not tested)  LOWER EXTREMITY SPECIAL TESTS:  NT  FUNCTIONAL TESTS:  2 minute walk test: TBA SLS 8 sec  07/28/23  SLS Left 25 sec , SLS right 60  GAIT: Distance walked: 200' Assistive device utilized: None Level of assistance: Complete Independence Comments: Decreased push off L when barefooted  TREATMENT DATE:  [facility] Adult PT Treatment:                                                DATE: 08/04/23 Therapeutic Exercise: Rec bike for 5 mins L3 Slant board stretch gastroc stretch / soleus stretch c towel roll at toes for plantar fascia Seated hamstring and calf stretch c strap Ankle DF, Ev, and Inv RTB 2x12 each Manual Therapy: STM to the L gastroc and soleus muscles with TPR to the lateral gastroc  [facility] Adult PT Treatment:                                                DATE: 07/28/23 Therapeutic Exercise: Supine h/s stretch x 3 each  Slant board stretch gastroc stretch / soleus stretch Slant board board heel raise x 8 Seated Marble pick up x 20 Seated toe extension AROM  Modalities: Ionto patch to plantar fascia 1ml dexamethasone - 4-6 hour wear time Ice pack to bottom of foot x 5 minutes , hooklying   [facility] Adult PT Treatment:                                                DATE: 07/25/23 Therapeutic Exercise: Supine h/s stretches with strap  3 each for 30 sec  Standing gastroc stretches 20 sec  DF mobs knee to counter Bilateral heel raises x 20  Manual Therapy: STW strumming to plantar fascia mod pressure  Modalities: Ionto patch to plantar fascia 1ml dexamethasone - 4-6 hour wear time.    PATIENT EDUCATION:  Education details: Eval findings, POC, HEP, self care  Person educated: Patient Education method: Explanation,  Demonstration, Tactile cues, Verbal cues, and Handouts Education comprehension: verbalized understanding, returned demonstration, verbal cues required, and tactile cues required  HOME EXERCISE PROGRAM: Access Code: [email or web address] URL: [email or web address] Date: 07/22/2023 Prepared by: Joellyn Rued  Exercises - Long Sitting Calf Stretch with Strap  - 1 x daily - 7 x weekly - 1 sets - 5 reps - 20 hold - Gastroc Stretch on Wall  - 1 x daily - 7 x weekly - 1 sets - 3 reps - 30 hold - Seated Plantar Fascia Stretch  - 1 x daily - 7 x weekly - 1 sets - 1-3 reps - 30 hold - Seated Hamstring Stretch with Strap  - 1 x daily - 7 x weekly - 3 sets - 10 reps - Heel Raises with Counter Support  - 1 x daily - 7 x weekly - 3 sets - 10 reps - 3 hold - Single Leg Stance  - 1 x daily - 7 x weekly - 1 sets - 3 reps - 30 hold - Toe Yoga - Alternating Great Toe and Lesser Toe Extension  - 1 x daily - 7 x weekly - 2 sets - 20 reps - Seated Toe Towel Scrunches  - 1 x daily - 7 x weekly - 2 sets - 10 reps  ASSESSMENT:  CLINICAL IMPRESSION: Pt purchased a slant board and has found it to be very effective with decreasing his L foot pain.  Pt was completed today for STM to the gastroc and soleus muscles and TPR to the lateral gastroc. Strengthening exs included Tband exs for strengthening and stability. Efforts for hamstring flexibility were continued. Pt tolerated PT today without adverse effects. Pt will continue to benefit from skilled PT to address impairments for improved function. Will assess LTGs the next PT session.  EVAL: Patient is a 63 y.o. male who was seen today for physical therapy evaluation and treatment for M72.2 (ICD-10-CM) - Plantar fasciitis, left . Pt presents with signs and symptoms consistent with the referring Dx. Pt has decreased ankle DF AROM, markedly decreased hamstring flexibility, and is significantly TTP of the arch of the L foot. A HEP for stretching, and strengthening (tissue  remodeling) was initiated. Pt will benefit from skilled PT 2w6 to address impairments to optimize L foot function with less pain.   OBJECTIVE IMPAIRMENTS: decreased activity tolerance, difficulty walking, decreased ROM, increased fascial restrictions, obesity, and pain.   ACTIVITY LIMITATIONS: carrying, lifting, standing, stairs, and locomotion level  PARTICIPATION LIMITATIONS: meal prep, cleaning, shopping, community activity, and occupation  PERSONAL FACTORS: Past/current experiences and 1 comorbidity: high BMI  are also affecting patient's functional outcome.   REHAB POTENTIAL: Good  CLINICAL DECISION MAKING: Stable/uncomplicated  EVALUATION COMPLEXITY: Low   GOALS:  LONG TERM GOALS: Target date: 08/26/23  Pt will be Ind in a final HEP to maintain achieved LOF  Baseline: initiated Goal status: INITIAL  2.  Increase L ankle DF AROM c ext knee to 12d or greater to minimize plantar fascia strain Baseline: 7d Goal status: INITIAL  3.  Increase L hamstring flexibility to 40d or greater to minimize posterior LE muscular strain Baseline: 30d 07/25/23: 35d right, 50 deg  Goal status: INITIAL  4.  Pt will report a 50% or greater improvement in L foot pain for improved function and QOL Baseline: 3/10 ave; 1-8/10 Goal status: INITIAL  5.  .Improve by MCID of 11ft as indication of improved functional mobility if pt scores below standard for age Baseline: TBA Goal status: INITIAL   PLAN:  PT FREQUENCY: 2x/week  PT DURATION: 6 weeks  PLANNED INTERVENTIONS: 97164- PT Re-evaluation, 97110-Therapeutic exercises, 97530- Therapeutic activity, [id number]- Neuromuscular re-education, 97535- Self Care, 16109- Manual therapy, [id number]- Gait training, [phone]- Electrical stimulation (unattended), [id number]- Electrical stimulation (manual), [id number]- Ionotophoresis 4mg /ml Dexamethasone, Patient/Family education, Taping, Dry Needling, Joint mobilization, Cryotherapy, and Moist heat  PLAN FOR NEXT  SESSION: Assess ; assess response to HEP; progress therex as indicated; use of modalities, manual therapy; and TPDN as indicated.  Regnald Bowens MS, PT 08/04/23 6:03 PM

## 2023-08-04 ENCOUNTER — Ambulatory Visit: Payer: 59

## 2023-08-04 DIAGNOSIS — R262 Difficulty in walking, not elsewhere classified: Secondary | ICD-10-CM

## 2023-08-04 DIAGNOSIS — M79672 Pain in left foot: Secondary | ICD-10-CM

## 2023-08-04 DIAGNOSIS — M25672 Stiffness of left ankle, not elsewhere classified: Secondary | ICD-10-CM

## 2023-08-08 ENCOUNTER — Ambulatory Visit: Payer: 59 | Admitting: Internal Medicine

## 2023-08-08 ENCOUNTER — Encounter: Payer: Self-pay | Admitting: Internal Medicine

## 2023-08-08 VITALS — BP 132/68 | HR 70 | Temp 98.0°F | Ht 69.0 in | Wt 219.4 lb

## 2023-08-08 DIAGNOSIS — I10 Essential (primary) hypertension: Secondary | ICD-10-CM | POA: Diagnosis not present

## 2023-08-08 DIAGNOSIS — D508 Other iron deficiency anemias: Secondary | ICD-10-CM | POA: Diagnosis not present

## 2023-08-08 DIAGNOSIS — E785 Hyperlipidemia, unspecified: Secondary | ICD-10-CM | POA: Diagnosis not present

## 2023-08-08 DIAGNOSIS — M79672 Pain in left foot: Secondary | ICD-10-CM | POA: Insufficient documentation

## 2023-08-08 DIAGNOSIS — E1065 Type 1 diabetes mellitus with hyperglycemia: Secondary | ICD-10-CM

## 2023-08-08 DIAGNOSIS — H47233 Glaucomatous optic atrophy, bilateral: Secondary | ICD-10-CM | POA: Insufficient documentation

## 2023-08-08 LAB — URINALYSIS, ROUTINE W REFLEX MICROSCOPIC
Bilirubin Urine: NEGATIVE
Hgb urine dipstick: NEGATIVE
Ketones, ur: NEGATIVE
Leukocytes,Ua: NEGATIVE
Nitrite: NEGATIVE
RBC / HPF: NONE SEEN (ref 0–?)
Specific Gravity, Urine: 1.01 (ref 1.000–1.030)
Total Protein, Urine: NEGATIVE
Urine Glucose: >=1000 — AB
Urobilinogen, UA: 0.2 (ref 0.0–1.0)
WBC, UA: NONE SEEN (ref 0–?)
pH: 6 (ref 5.0–8.0)

## 2023-08-08 LAB — HEPATIC FUNCTION PANEL
ALT: 16 U/L (ref 0–53)
AST: 14 U/L (ref 0–37)
Albumin: 4.1 g/dL (ref 3.5–5.2)
Alkaline Phosphatase: 74 U/L (ref 39–117)
Bilirubin, Direct: 0.2 mg/dL (ref 0.0–0.3)
Total Bilirubin: 0.8 mg/dL (ref 0.2–1.2)
Total Protein: 6.6 g/dL (ref 6.0–8.3)

## 2023-08-08 LAB — TSH: TSH: 3.3 u[IU]/mL (ref 0.35–5.50)

## 2023-08-08 LAB — MICROALBUMIN / CREATININE URINE RATIO
Creatinine,U: 51.2 mg/dL
Microalb Creat Ratio: UNDETERMINED mg/g (ref 0.0–30.0)
Microalb, Ur: <0.7 mg/dL

## 2023-08-08 LAB — CBC WITH DIFFERENTIAL/PLATELET
Basophils Absolute: 0.1 10*3/uL (ref 0.0–0.1)
Basophils Relative: 0.6 % (ref 0.0–3.0)
Eosinophils Absolute: 0.4 10*3/uL (ref 0.0–0.7)
Eosinophils Relative: 4.3 % (ref 0.0–5.0)
HCT: 43.4 % (ref 39.0–52.0)
Hemoglobin: 14.4 g/dL (ref 13.0–17.0)
Lymphocytes Relative: 21.2 % (ref 12.0–46.0)
Lymphs Abs: 1.7 10*3/uL (ref 0.7–4.0)
MCHC: 33.2 g/dL (ref 30.0–36.0)
MCV: 87.1 fl (ref 78.0–100.0)
Monocytes Absolute: 0.6 10*3/uL (ref 0.1–1.0)
Monocytes Relative: 8 % (ref 3.0–12.0)
Neutro Abs: 5.3 10*3/uL (ref 1.4–7.7)
Neutrophils Relative %: 65.9 % (ref 43.0–77.0)
Platelets: 232 10*3/uL (ref 150.0–400.0)
RBC: 4.98 Mil/uL (ref 4.22–5.81)
RDW: 14.3 % (ref 11.5–15.5)
WBC: 8.1 10*3/uL (ref 4.0–10.5)

## 2023-08-08 LAB — IBC + FERRITIN
Ferritin: 101.5 ng/mL (ref 22.0–322.0)
Iron: 62 ug/dL (ref 42–165)
Saturation Ratios: 17 % — ABNORMAL LOW (ref 20.0–50.0)
TIBC: 365.4 ug/dL (ref 250.0–450.0)
Transferrin: 261 mg/dL (ref 212.0–360.0)

## 2023-08-08 LAB — HEMOGLOBIN A1C: Hgb A1c MFr Bld: 8 % — ABNORMAL HIGH (ref 4.6–6.5)

## 2023-08-08 NOTE — Progress Notes (Unsigned)
 Subjective:  Patient ID: Zachary Graham, male    DOB: 1960/12/29  Age: 63 y.o. MRN: [id number]  CC: Hypertension and Diabetes   HPI Zachary Graham presents for f/up ----  Discussed the use of AI scribe software for clinical note transcription with the patient, who gave verbal consent to proceed.  History of Present Illness   Zachary Graham is a 63 year old male with PVCs who presents with concerns about medication effects and plantar fasciitis.  He has been diagnosed with premature ventricular contractions (PVCs) and has stopped taking a stimulant medication, which was suspected to contribute to the PVCs. Since discontinuing the stimulant, he feels more sluggish and has difficulty maintaining focus, describing himself as more scattered during work. He wants to reduce the number of medications he is taking.  He has a history of diabetes and uses an insulin pump. There have been no recent warnings from the pump, and he has not experienced symptoms of hyperglycemia or hypoglycemia. However, he notes that changing the infusion port causes temporary spikes in blood glucose levels. His last HbA1c was 8.0% in September 2024.  He reports having plantar fasciitis in one foot, which he manages with incline stretches that alleviate the pain. He describes a knot in the affected area but notes that stretching helps relieve the discomfort.  No chest pain, shortness of breath, excessive thirst, or excessive urination. He stays active and has not noticed any swelling in his legs.       Outpatient Medications Prior to Visit  Medication Sig Dispense Refill   Alpha-Lipoic Acid 600 MG CAPS Take 1 capsule by mouth daily.      Ascorbic Acid (VITAMIN C) 1000 MG tablet Take 1,000 mg by mouth daily.     aspirin EC 81 MG tablet Take by mouth daily.     atorvastatin (LIPITOR) 80 MG tablet Take 1 tablet (80 mg total) by mouth daily. 90 tablet 1   azelastine (ASTELIN) 0.1 % nasal spray Place 2 sprays into both nostrils  2 (two) times daily. Use in each nostril as directed 90 mL 1   b complex vitamins tablet Take 1 tablet by mouth daily.     BAYER MICROLET LANCETS lancets USE TO TEST BLOOD SUGAR 6 TIMES A DAY 300 each 11   Beclomethasone Dipropionate (QNASL) 80 MCG/ACT AERS Place 2 puffs into the nose daily as needed (allergies). 10.6 g 5   Calcium-Magnesium-Vitamin D (CALCIUM 500 PO) Take 1 tablet by mouth daily.      clotrimazole-betamethasone (LOTRISONE) cream Apply to affected areas 2 (two) times daily. STOP USING AFTER 2 WEEKS 45 g 1   Continuous Glucose Sensor (DEXCOM G7 SENSOR) MISC Apply 1 sensor every 10 days 9 each 3   cyclobenzaprine (FLEXERIL) 10 MG tablet Take 1 tablet (10 mg total) by mouth 3 (three) times daily as needed for muscle spasms. 30 tablet 0   dapagliflozin propanediol (FARXIGA) 5 MG TABS tablet Take 1 tablet (5 mg total) by mouth daily before breakfast. 90 tablet 3   EPINEPHrine 0.3 mg/0.3 mL IJ SOAJ injection Inject 0.3 mg into the muscle as needed for anaphylaxis. 1 each PRN   famotidine (PEPCID) 40 MG tablet Take 1 tablet by mouth at bedtime.     ferrous sulfate 325 (65 FE) MG tablet Take 1 tablet (325 mg total) by mouth daily with breakfast. 90 tablet 0   Glucagon 3 MG/DOSE POWD Place 3 mg into the nose once as needed for up to 1  dose. 1 each 11   glucose blood (CONTOUR NEXT TEST) test strip USE AS INSTRUCTED TO CHECK SUGAR 3 TIMES DAILY 100 strip 3   GVOKE HYPOPEN 1-PACK 1 MG/0.2ML SOAJ Inject under skin 0.2 mL as needed for hypoglycemia 0.2 mL PRN   hydrOXYzine (ATARAX) 10 MG tablet Take 1 tablet by mouth at bedtime as needed. 90 tablet 0   insulin glargine-yfgn (SEMGLEE) 100 UNIT/ML injection Inject into the skin.     insulin lispro (HUMALOG) 100 UNIT/ML injection Inject up to 150 units into insulin pump daily. 50 mL 3   ipratropium (ATROVENT) 0.06 % nasal spray Place 2 sprays into the nose as needed for rhinitis.     levocetirizine (XYZAL) 5 MG tablet TAKE 1 TABLET BY MOUTH IN THE  EVENING 90 tablet 1   losartan (COZAAR) 50 MG tablet Take 1 tablet (50 mg total) by mouth daily. 90 tablet 1   Magnesium Oxide 400 MG CAPS Take 1 capsule (400 mg total) by mouth daily. 90 capsule 1   metFORMIN (GLUCOPHAGE) 500 MG tablet Take 2 tablets (1,000 mg total) by mouth 2 (two) times daily before a meal. 360 tablet 3   metoprolol succinate (TOPROL XL) 25 MG 24 hr tablet Take 0.5 tablets (12.5 mg total) by mouth daily. 45 tablet 3   NON FORMULARY CPAP     omeprazole (PRILOSEC) 40 MG capsule Take 20 mg by mouth daily.     Semaglutide,0.25 or 0.5MG /DOS, 2 MG/3ML SOPN Inject into the skin once a week.     traZODone (DESYREL) 50 MG tablet Take 1 tablet (50 mg total) by mouth at bedtime. 90 tablet 1   No facility-administered medications prior to visit.    ROS Review of Systems  Objective:  BP 132/68 (BP Location: Left Arm, Patient Position: Sitting, Cuff Size: Large)   Pulse 70   Temp 98 F (36.7 C) (Oral)   Ht 5\' 9"  (1.753 m)   Wt 219 lb 6.4 oz (99.5 kg)   SpO2 97%   BMI 32.40 kg/m   BP Readings from Last 3 Encounters:  08/08/23 132/68  08/03/23 128/82  07/27/23 (!) 96/52    Wt Readings from Last 3 Encounters:  08/08/23 219 lb 6.4 oz (99.5 kg)  07/27/23 221 lb (100.2 kg)  06/22/23 224 lb (101.6 kg)    Physical Exam Cardiovascular:     Rate and Rhythm: Normal rate and regular rhythm. Frequent Extrasystoles are present.    Lab Results  Component Value Date   WBC 8.6 10/29/2022   HGB 14.1 10/29/2022   HCT 41 10/29/2022   PLT 232 10/29/2022   GLUCOSE 124 (H) 06/17/2023   CHOL 95 10/29/2022   TRIG 99 10/29/2022   HDL 37 10/29/2022   LDLDIRECT 87.0 12/02/2016   LDLCALC 42 10/29/2022   ALT 21 10/29/2022   AST 19 10/29/2022   NA 141 06/17/2023   K 4.3 06/17/2023   CL 102 06/17/2023   CREATININE 0.80 06/17/2023   BUN 12 06/17/2023   CO2 22 06/17/2023   TSH 3.30 10/29/2022   PSA 0.25 02/02/2023   HGBA1C 8.0 (H) 02/02/2023   MICROALBUR 0.0 10/29/2022     DG Wrist Complete Right Result Date: 12/02/2022 CLINICAL DATA:  Pain. EXAM: RIGHT WRIST - COMPLETE 4 VIEW COMPARISON:  None Available. FINDINGS: Osseous fragment that is well corticated may represent a chronic triquetral avulsion fracture. No acute fracture, dislocation or subluxation identified. No osteolytic or osteoblastic changes. Joint spaces are maintained. IMPRESSION: Osseous fragment posterior to  the wrist which is consistent with an old avulsion of the triquetrum. No acute osseous abnormalities identified. Electronically Signed   By: Layla Maw M.D.   On: 12/02/2022 16:53   MR ABDOMEN WWO CONTRAST Result Date: 11/26/2022 CLINICAL DATA:  Splenomegaly, injury of the spleen EXAM: MRI ABDOMEN WITHOUT AND WITH CONTRAST TECHNIQUE: Multiplanar multisequence MR imaging of the abdomen was performed both before and after the administration of intravenous contrast. CONTRAST:  10mL GADAVIST GADOBUTROL 1 MMOL/ML IV SOLN COMPARISON:  None Available. FINDINGS: Lower chest: No acute abnormality. Hepatobiliary: No solid liver abnormality is seen. Hepatomegaly, maximum coronal span 23.7 cm. Contracted gallbladder. No gallstones, gallbladder wall thickening, or biliary dilatation. Pancreas: Unremarkable. No pancreatic ductal dilatation or surrounding inflammatory changes. Spleen: Normal in size without significant abnormality. No evidence of acute traumatic injury. Adrenals/Urinary Tract: Adrenal glands are unremarkable. Kidneys are normal, without renal calculi, solid lesion, or hydronephrosis. Stomach/Bowel: Stomach is within normal limits. No evidence of bowel wall thickening, distention, or inflammatory changes. Moderate burden of stool. Vascular/Lymphatic: No significant vascular findings are present. No enlarged abdominal lymph nodes. Other: No abdominal wall hernia or abnormality. No ascites. Musculoskeletal: No acute or significant osseous findings. IMPRESSION: 1. Normal size of the spleen without  significant abnormality. No evidence of acute traumatic injury. 2. Hepatomegaly. Electronically Signed   By: Jearld Lesch M.D.   On: 11/26/2022 20:17   US SPLEEN (ABDOMEN LIMITED) Result Date: 11/26/2022 CLINICAL DATA:  Status post fall with suspected injury to the spleen. EXAM: ULTRASOUND ABDOMEN LIMITED COMPARISON:  None Available. FINDINGS: The spleen measures 11.5 x 3.4 x 4.1 cm for a volume of 84.2 cm. No splenic laceration is seen. IMPRESSION: No evidence of splenomegaly or splenic laceration. Electronically Signed   By: Ted Mcalpine M.D.   On: 11/26/2022 18:56    Assessment & Plan:  Type 1 diabetes mellitus with hyperglycemia, with long-term current use of insulin (HCC) -     Urinalysis, Routine w reflex microscopic; Future -     Hepatic function panel; Future -     Hemoglobin A1c; Future -     Microalbumin / creatinine urine ratio; Future -     HM Diabetes Foot Exam  Iron deficiency anemia secondary to inadequate dietary iron intake -     IBC + Ferritin; Future -     CBC with Differential/Platelet; Future  Hyperlipidemia with target LDL less than 100 -     TSH; Future -     Hepatic function panel; Future  Primary hypertension -     CBC with Differential/Platelet; Future -     Urinalysis, Routine w reflex microscopic; Future     Follow-up: Return in about 6 months (around 02/08/2024).  Sanda Linger, MD

## 2023-08-08 NOTE — Patient Instructions (Signed)
 Hypertension, Adult High blood pressure (hypertension) is when the force of blood pumping through the arteries is too strong. The arteries are the blood vessels that carry blood from the heart throughout the body. Hypertension forces the heart to work harder to pump blood and may cause arteries to become narrow or stiff. Untreated or uncontrolled hypertension can lead to a heart attack, heart failure, a stroke, kidney disease, and other problems. A blood pressure reading consists of a higher number over a lower number. Ideally, your blood pressure should be below 120/80. The first ("top") number is called the systolic pressure. It is a measure of the pressure in your arteries as your heart beats. The second ("bottom") number is called the diastolic pressure. It is a measure of the pressure in your arteries as the heart relaxes. What are the causes? The exact cause of this condition is not known. There are some conditions that result in high blood pressure. What increases the risk? Certain factors may make you more likely to develop high blood pressure. Some of these risk factors are under your control, including: Smoking. Not getting enough exercise or physical activity. Being overweight. Having too much fat, sugar, calories, or salt (sodium) in your diet. Drinking too much alcohol. Other risk factors include: Having a personal history of heart disease, diabetes, high cholesterol, or kidney disease. Stress. Having a family history of high blood pressure and high cholesterol. Having obstructive sleep apnea. Age. The risk increases with age. What are the signs or symptoms? High blood pressure may not cause symptoms. Very high blood pressure (hypertensive crisis) may cause: Headache. Fast or irregular heartbeats (palpitations). Shortness of breath. Nosebleed. Nausea and vomiting. Vision changes. Severe chest pain, dizziness, and seizures. How is this diagnosed? This condition is diagnosed by  measuring your blood pressure while you are seated, with your arm resting on a flat surface, your legs uncrossed, and your feet flat on the floor. The cuff of the blood pressure monitor will be placed directly against the skin of your upper arm at the level of your heart. Blood pressure should be measured at least twice using the same arm. Certain conditions can cause a difference in blood pressure between your right and left arms. If you have a high blood pressure reading during one visit or you have normal blood pressure with other risk factors, you may be asked to: Return on a different day to have your blood pressure checked again. Monitor your blood pressure at home for 1 week or longer. If you are diagnosed with hypertension, you may have other blood or imaging tests to help your health care provider understand your overall risk for other conditions. How is this treated? This condition is treated by making healthy lifestyle changes, such as eating healthy foods, exercising more, and reducing your alcohol intake. You may be referred for counseling on a healthy diet and physical activity. Your health care provider may prescribe medicine if lifestyle changes are not enough to get your blood pressure under control and if: Your systolic blood pressure is above 130. Your diastolic blood pressure is above 80. Your personal target blood pressure may vary depending on your medical conditions, your age, and other factors. Follow these instructions at home: Eating and drinking  Eat a diet that is high in fiber and potassium, and low in sodium, added sugar, and fat. An example of this eating plan is called the DASH diet. DASH stands for Dietary Approaches to Stop Hypertension. To eat this way: Eat  plenty of fresh fruits and vegetables. Try to fill one half of your plate at each meal with fruits and vegetables. Eat whole grains, such as whole-wheat pasta, brown rice, or whole-grain bread. Fill about one  fourth of your plate with whole grains. Eat or drink low-fat dairy products, such as skim milk or low-fat yogurt. Avoid fatty cuts of meat, processed or cured meats, and poultry with skin. Fill about one fourth of your plate with lean proteins, such as fish, chicken without skin, beans, eggs, or tofu. Avoid pre-made and processed foods. These tend to be higher in sodium, added sugar, and fat. Reduce your daily sodium intake. Many people with hypertension should eat less than 1,500 mg of sodium a day. Do not drink alcohol if: Your health care provider tells you not to drink. You are pregnant, may be pregnant, or are planning to become pregnant. If you drink alcohol: Limit how much you have to: 0-1 drink a day for women. 0-2 drinks a day for men. Know how much alcohol is in your drink. In the [place] one drink equals one 12 oz bottle of beer (355 mL), one 5 oz glass of wine (148 mL), or one 1 oz glass of hard liquor (44 mL). Lifestyle  Work with your health care provider to maintain a healthy body weight or to lose weight. Ask what an ideal weight is for you. Get at least 30 minutes of exercise that causes your heart to beat faster (aerobic exercise) most days of the week. Activities may include walking, swimming, or biking. Include exercise to strengthen your muscles (resistance exercise), such as Pilates or lifting weights, as part of your weekly exercise routine. Try to do these types of exercises for 30 minutes at least 3 days a week. Do not use any products that contain nicotine or tobacco. These products include cigarettes, chewing tobacco, and vaping devices, such as e-cigarettes. If you need help quitting, ask your health care provider. Monitor your blood pressure at home as told by your health care provider. Keep all follow-up visits. This is important. Medicines Take over-the-counter and prescription medicines only as told by your health care provider. Follow directions carefully. Blood  pressure medicines must be taken as prescribed. Do not skip doses of blood pressure medicine. Doing this puts you at risk for problems and can make the medicine less effective. Ask your health care provider about side effects or reactions to medicines that you should watch for. Contact a health care provider if you: Think you are having a reaction to a medicine you are taking. Have headaches that keep coming back (recurring). Feel dizzy. Have swelling in your ankles. Have trouble with your vision. Get help right away if you: Develop a severe headache or confusion. Have unusual weakness or numbness. Feel faint. Have severe pain in your chest or abdomen. Vomit repeatedly. Have trouble breathing. These symptoms may be an emergency. Get help right away. Call 911. Do not wait to see if the symptoms will go away. Do not drive yourself to the hospital. Summary Hypertension is when the force of blood pumping through your arteries is too strong. If this condition is not controlled, it may put you at risk for serious complications. Your personal target blood pressure may vary depending on your medical conditions, your age, and other factors. For most people, a normal blood pressure is less than 120/80. Hypertension is treated with lifestyle changes, medicines, or a combination of both. Lifestyle changes include losing weight, eating a healthy,  low-sodium diet, exercising more, and limiting alcohol. This information is not intended to replace advice given to you by your health care provider. Make sure you discuss any questions you have with your health care provider. Document Revised: 03/10/2021 Document Reviewed: 03/10/2021 Elsevier Patient Education  2024 [employer].

## 2023-08-09 ENCOUNTER — Encounter: Payer: Self-pay | Admitting: Internal Medicine

## 2023-08-11 ENCOUNTER — Ambulatory Visit

## 2023-08-11 DIAGNOSIS — R262 Difficulty in walking, not elsewhere classified: Secondary | ICD-10-CM

## 2023-08-11 DIAGNOSIS — M79672 Pain in left foot: Secondary | ICD-10-CM | POA: Diagnosis not present

## 2023-08-11 DIAGNOSIS — M25672 Stiffness of left ankle, not elsewhere classified: Secondary | ICD-10-CM

## 2023-08-11 NOTE — Therapy (Signed)
 OUTPATIENT PHYSICAL THERAPY TREATMENT    Patient Name: Zachary Graham MRN: [id number] DOB:Nov 29, 1960, 63 y.o., male Today's Date: 08/11/2023  END OF SESSION:  PT End of Session - 07/25/23 1319     Visit Number 4    Number of Visits 13    Date for PT Re-Evaluation 08/26/23    Authorization Type [employer] SAVE    PT Start Time PT end time Total  1317  1400 43             Past Medical History:  Diagnosis Date   Diabetes mellitus    GERD (gastroesophageal reflux disease)    Heart murmur    Hyperlipidemia    Hypertension    OSA (obstructive sleep apnea)    Pneumonia    Past Surgical History:  Procedure Laterality Date   CERVICAL DISC ARTHROPLASTY N/A 03/16/2017   Procedure: CERVICAL FIVE- CERVICAL SIX DISC ARTHROPLASTY;  Surgeon: Shirlean Kelly, MD;  Location: [facility] OR;  Service: Neurosurgery;  Laterality: N/A;  CERVICAL 5- CERVICAL 6 DISC ARTHROPLASTY   COLONOSCOPY     TONSILLECTOMY     Patient Active Problem List   Diagnosis Date Noted   Glaucomatous optic atrophy, bilateral 08/08/2023   Fibroma of foot 08/03/2023   Preglaucoma, unspecified, bilateral 01/14/2022   Seborrheic keratosis 01/14/2022   Type 1 diabetes mellitus with hyperglycemia (HCC) 01/14/2022   Vasomotor rhinitis 01/14/2022   Adult attention deficit hyperactivity disorder 01/14/2022   Allergy to shellfish 01/14/2022   Dysphagia, oropharyngeal phase 01/14/2022   Gastro-esophageal reflux disease without esophagitis 01/14/2022   Low back pain 10/29/2021   Iron deficiency anemia secondary to inadequate dietary iron intake 02/02/2021   Frequent PVCs 10/28/2020   Hypertension    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) 03/20/2018   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 diabetes mellitus (HCC) 08/31/2017   Mild cataract of right eye 08/31/2017   HNP (herniated nucleus pulposus), cervical 03/16/2017    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 (HCC) 01/06/2017   Herniation of cervical intervertebral disc with radiculopathy 01/04/2017   Current mild episode of major depressive disorder without prior episode (HCC) 12/02/2016   Diabetic neuropathy (HCC) 03/01/2016   Obesity, Class II, BMI 35.0-39.9, with comorbidity (see actual BMI) 04/24/2015   Type 1 diabetes mellitus with hyperglycemia, with long-term current use of insulin (HCC) 04/04/2015   Patellofemoral stress syndrome of left knee 03/19/2015   Allergic rhinitis due to pollen 11/14/2014   Equinus deformity of foot, acquired 05/29/2014   ADHD (attention deficit hyperactivity disorder), inattentive type 10/24/2013   Vitamin D deficiency 02/28/2013   Pulmonic stenosis 05/25/2012   Pure hyperglyceridemia 05/25/2012   Hyperlipidemia with target LDL less than 100 09/20/2011   OSA (obstructive sleep apnea) 09/20/2011   Routine general medical examination at a health care facility 09/20/2011    PCP: [name], MD   REFERRING PROVIDER: Judi Saa, DO  REFERRING DIAG: M72.2 (ICD-10-CM) - Plantar fasciitis, left   THERAPY DIAG:  Pain in left foot  Decreased range of motion of left ankle  Difficulty in walking, not elsewhere classified  Rationale for Evaluation and Treatment: Rehabilitation  ONSET DATE: 9 weeks  SUBJECTIVE:   SUBJECTIVE STATEMENT: Pt's reports his foot pain is 96% better. Still tender to touch on the bottom of his L foot. T takes longer walks before he starts to experience plantar foot pain.  EVAL: Pt reports  an insidious onset of L foot pain. Pt notes using a carbon fiber insert, as well as orthotic insert together, have helped to make walking more comfortable. Pt states after experiencing increases in L foot pain, rest alone will lower the pain to a very low ache in a short time frame.  PERTINENT HISTORY: High BMI; See PMH  PAIN:  Are you having pain?  Yes: NPRS scale: 0/10: pain range the week prior to start of PT: 1-8/10 Pain location: Ache of the foot Pain description: Sharp, ache Aggravating factors: Prolonged walking and certain movements Relieving factors: Rest  PRECAUTIONS: None  RED FLAGS: None   WEIGHT BEARING RESTRICTIONS: No  FALLS:  Has patient fallen in last 6 months? 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 Katrinka Blazing 3/19  OBJECTIVE:  Note: Objective measures were completed at Evaluation unless otherwise noted.  DIAGNOSTIC FINDINGS:  L ankle DG 06/22/23 FINDINGS: There is no evidence of fracture, dislocation, or joint effusion. There is no evidence of arthropathy or other focal bone abnormality. Soft tissues are unremarkable.   IMPRESSION: Negative.  PATIENT SURVEYS:  LEFS 42/80- Mild to mod functional limitation  COGNITION: Overall cognitive status: Within functional limits for tasks assessed     SENSATION: WFL  EDEMA:  None observed or palpated  MUSCLE LENGTH: Hamstrings: Right 30 deg; Left 30 deg Thomas test: Right tight deg; Left tight deg  POSTURE:  Out toeing, normal arch  PALPATION: TTP arch of L foot  LOWER EXTREMITY ROM:   Active ROM Right eval Left eval Right  07/25/23 Left  07/25/23  Hip flexion      Hip extension      Hip abduction      Hip adduction      Hip internal rotation      Hip external rotation      Knee flexion      Knee extension      Ankle dorsiflexion 7 knee extended 7 knee extended 5 2  Ankle plantarflexion      Ankle inversion      Ankle eversion       (Blank rows = not tested)  LOWER EXTREMITY MMT:  MMT Right eval Left eval  Hip flexion 4+ 4+  Hip extension 4+ 4+  Hip abduction 5 5  Hip adduction 5 5  Hip internal rotation 5 5  Hip external rotation 5 5  Knee flexion 5 5  Knee  extension 5 5  Ankle dorsiflexion 5 5  Ankle plantarflexion 5 5  Ankle inversion 5 5  Ankle eversion 5 5   (Blank rows = not tested)  LOWER EXTREMITY SPECIAL TESTS:  NT  FUNCTIONAL TESTS:  2 minute walk test: TBA SLS 8 sec  07/28/23  SLS Left 25 sec , SLS right 60  GAIT: Distance walked: 200' Assistive device utilized: None Level of assistance: Complete Independence Comments: Decreased push off L when barefooted  TREATMENT DATE:  [facility] Adult PT Treatment:                                                DATE: 08/11/23 Manual Therapy: STM to the L gastroc and soleus muscles Skilled palpation to the L calf to identify Tps and taut muscle bands Trigger Point Dry Needling Initial Treatment: Pt instructed on Dry Needling rational, procedures, and possible side effects. Pt instructed to expect mild to moderate muscle soreness later in the day and/or into the next day.  Pt instructed in methods to reduce muscle soreness. Pt instructed to continue prescribed HEP. Because Dry Needling was performed over or adjacent to a lung field, pt was educated on S/S of pneumothorax and to seek immediate medical attention should they occur.  Patient was educated on signs and symptoms of infection and other risk factors and advised to seek medical attention should they occur.  Patient verbalized understanding of these instructions and education.   Patient Verbal Consent Given: Yes Education Handout Provided: Yes Muscles Treated: L gastroc and soleus Electrical Stimulation Performed: No Treatment Response/Outcome: Twitch responses   Therapeutic Exercise: Slant board stretch gastroc stretch / soleus stretch c towel roll at toes for plantar fascia Seated hamstring and calf stretch c strap Heel raises 2x10 on 2 inch riser Therapeutic Activity: SL balance on airex Rocker board AP and  laterally  [facility] Adult PT Treatment:                                                DATE: 08/04/23 Therapeutic Exercise: Rec bike for 5 mins L3 Slant board stretch gastroc stretch / soleus stretch c towel roll at toes for plantar fascia Seated hamstring and calf stretch c strap Ankle DF, Ev, and Inv RTB 2x12 each Manual Therapy: STM to the L gastroc and soleus muscles with TPR to the lateral gastroc  [facility] Adult PT Treatment:                                                DATE: 07/28/23 Therapeutic Exercise: Supine h/s stretch x 3 each  Slant board stretch gastroc stretch / soleus stretch Slant board board heel raise x 8 Seated Marble pick up x 20 Seated toe extension AROM  Modalities: Ionto patch to plantar fascia 1ml dexamethasone - 4-6 hour wear time Ice pack to bottom of foot x 5 minutes , hooklying    PATIENT EDUCATION:  Education details: Eval findings, POC, HEP, self care  Person educated: Patient Education method: Explanation, Demonstration, Tactile cues, Verbal cues, and Handouts Education comprehension: verbalized understanding, returned demonstration, verbal cues required, and tactile cues required  HOME EXERCISE PROGRAM: Access Code: [email or web address] URL: [email or web address] Date: 07/22/2023 Prepared by: Joellyn Rued  Exercises - Long Sitting Calf Stretch with Strap  - 1 x daily - 7 x weekly - 1 sets - 5 reps - 20 hold - Gastroc Stretch on Wall  - 1 x daily - 7 x weekly - 1 sets - 3 reps - 30 hold - Seated Plantar Fascia Stretch  - 1 x daily - 7  x weekly - 1 sets - 1-3 reps - 30 hold - Seated Hamstring Stretch with Strap  - 1 x daily - 7 x weekly - 3 sets - 10 reps - Heel Raises with Counter Support  - 1 x daily - 7 x weekly - 3 sets - 10 reps - 3 hold - Single Leg Stance  - 1 x daily - 7 x weekly - 1 sets - 3 reps - 30 hold - Toe Yoga - Alternating Great Toe and Lesser Toe Extension  - 1 x daily - 7 x weekly - 2 sets - 20 reps - Seated Toe Towel Scrunches  -  1 x daily - 7 x weekly - 2 sets - 10 reps  ASSESSMENT:  CLINICAL IMPRESSION: PT was completed for STM to the L calf f/b TPDN to Tps and taut muscles bands. Muscle twitch responses were elicited. Exercise were then completed for L LE/ankle flexibility, strengthening, and balance. At end of session, pt reported anticipated increased soreness of the L calf. Will assess pt's complete response to today's PT session his next visit. Pt is responding positively to PT intervention. Pt will continue to benefit from skilled PT to address impairments for improved function.  EVAL: Patient is a 63 y.o. male who was seen today for physical therapy evaluation and treatment for M72.2 (ICD-10-CM) - Plantar fasciitis, left . Pt presents with signs and symptoms consistent with the referring Dx. Pt has decreased ankle DF AROM, markedly decreased hamstring flexibility, and is significantly TTP of the arch of the L foot. A HEP for stretching, and strengthening (tissue remodeling) was initiated. Pt will benefit from skilled PT 2w6 to address impairments to optimize L foot function with less pain.   OBJECTIVE IMPAIRMENTS: decreased activity tolerance, difficulty walking, decreased ROM, increased fascial restrictions, obesity, and pain.   ACTIVITY LIMITATIONS: carrying, lifting, standing, stairs, and locomotion level  PARTICIPATION LIMITATIONS: meal prep, cleaning, shopping, community activity, and occupation  PERSONAL FACTORS: Past/current experiences and 1 comorbidity: high BMI  are also affecting patient's functional outcome.   REHAB POTENTIAL: Good  CLINICAL DECISION MAKING: Stable/uncomplicated  EVALUATION COMPLEXITY: Low   GOALS:  LONG TERM GOALS: Target date: 08/26/23  Pt will be Ind in a final HEP to maintain achieved LOF  Baseline: initiated Goal status: INITIAL  2.  Increase L ankle DF AROM c ext knee to 12d or greater to minimize plantar fascia strain Baseline: 7d Goal status: INITIAL  3.   Increase L hamstring flexibility to 40d or greater to minimize posterior LE muscular strain Baseline: 30d 07/25/23: 35d right, 50 deg  Goal status: INITIAL  4.  Pt will report a 50% or greater improvement in L foot pain for improved function and QOL Baseline: 3/10 ave; 1-8/10 08/11/23: 96% improvement Goal status: INITIAL  5.  .Improve by MCID of 74ft as indication of improved functional mobility if pt scores below standard for age Baseline: TBA Goal status: INITIAL   PLAN:  PT FREQUENCY: 2x/week  PT DURATION: 6 weeks  PLANNED INTERVENTIONS: 97164- PT Re-evaluation, 97110-Therapeutic exercises, 97530- Therapeutic activity, [id number]- Neuromuscular re-education, 97535- Self Care, [id number]- Manual therapy, [id number]- Gait training, [phone]- Electrical stimulation (unattended), [id number]- Electrical stimulation (manual), [id number]- Ionotophoresis 4mg /ml Dexamethasone, Patient/Family education, Taping, Dry Needling, Joint mobilization, Cryotherapy, and Moist heat  PLAN FOR NEXT SESSION: Assess ; assess response to HEP; progress therex as indicated; use of modalities, manual therapy; and TPDN as indicated.  Majestic Molony MS, PT 08/11/23 5:56 PM

## 2023-08-11 NOTE — Patient Instructions (Signed)

## 2023-08-12 ENCOUNTER — Ambulatory Visit (INDEPENDENT_AMBULATORY_CARE_PROVIDER_SITE_OTHER): Payer: 59 | Admitting: Internal Medicine

## 2023-08-12 ENCOUNTER — Encounter: Payer: Self-pay | Admitting: Internal Medicine

## 2023-08-12 VITALS — BP 130/70 | HR 63 | Resp 16 | Ht 69.0 in | Wt 221.4 lb

## 2023-08-12 DIAGNOSIS — E785 Hyperlipidemia, unspecified: Secondary | ICD-10-CM

## 2023-08-12 DIAGNOSIS — E1065 Type 1 diabetes mellitus with hyperglycemia: Secondary | ICD-10-CM

## 2023-08-12 NOTE — Patient Instructions (Addendum)
 Please use the following pump settings: - basal: 12 AM: 1.30 3 AM: 1.30 6:30 AM: 1.50 8 AM: 1.75 10 AM: 1.65 >> 1.75 12 PM: 1:35 >> 1:45 4 PM: 1.15 >> 1.25 8 pm: 1.20 >> 1.30 10:30 PM: 1.15 >> 1.25 - bolus:  - Insulin to carb ratio:  12 am: 1:4 >> 1:5 8 am: 1:3 >> 1:5 12 pm: 1:4 >> 1.5 (After increasing Ozempic, you may need to change this to 1:6) - Insulin sensitivity factor:  25  - target: 120-120 - Active insulin time: 3 hours  Please continue: - Metformin 1000 mg 2x with meals  - Farxiga 5 mg before breakfast - Ozempic 0.5 mg weekly  Please come back for a follow-up appointment in 4-6 months.

## 2023-08-12 NOTE — Progress Notes (Signed)
 Patient ID: Zachary Graham, male   DOB: 27-Dec-1960, 63 y.o.   MRN: [id number]   HPI: Zachary Graham is a 63 y.o.-year-old male, presenting for f/u for DM1, dx 1993, insulin-dependent, uncontrolled, with complications (mild sensory peripheral neuropathy; hypoglycemia episodes, DR). Last visit 5 months ago.  Interim history: No increased urination, blurry vision, nausea.   He had to come off Adderall since last visit due to PVCs - he had a 3-day heart monitor and was found to have frequent PVCs but for now, no intervention is needed. He continues to have long hours at work and significantly increased stress and burnout.   [facility]em[facility] 6 weeks ago. Now on 0.5 mg  - nausea improved. He cannot eat fried food, cannot eat too much 2/2 vomiting. On his scale at home, he lost 20 lbs.  Insulin pump: - Started in 2006 - Medtronic 670 G pump - now t:slim x2 - started beginning of 10/2021, replaced in 12/2022  CGM: -Libre CGM as a backup -now Dexcom G6 >> G7 CGM  Insulin: - Prev. Humalog >> Lyumjev  (50 vials for 3 months).  He initially lost 15 pounds after switching from Humalog to Lyumjev. - before last visit, he wanted to get back to NovoLog due to Lyumjev not being approved in the pump  - then Lyumjev again, but this was causing burning at the infusion site  - then NovoLog  - now Humalog  Reviewed HbA1c levels: Lab Results  Component Value Date   HGBA1C 8.0 (H) 08/08/2023   HGBA1C 8.0 (H) 02/02/2023   HGBA1C 8.0 01/25/2023   HGBA1C 7.4 (A) 09/24/2022   HGBA1C 7.1 (A) 03/26/2022   HGBA1C 7.6 (H) 01/27/2022   HGBA1C 7.4 (A) 11/30/2021   HGBA1C 7.8 (A) 07/24/2021   HGBA1C 8.0 12/27/2020   HGBA1C 7.6 (A) 12/05/2020   HGBA1C 8.3 (H) 10/28/2020   HGBA1C 7.2 (A) 07/24/2020   HGBA1C 7.2 (A) 03/26/2020   HGBA1C 7.3 (A) 11/20/2019   HGBA1C 7.4 (A) 08/02/2019   HGBA1C 7.4 (A) 04/06/2019   HGBA1C 7.6 (A) 12/08/2018   HGBA1C 8.2 (H) 10/19/2018   HGBA1C 7.7 (A) 05/02/2018   HGBA1C  7.3 (A) 01/05/2018  07/26/2022: HbA1c 7.7% 10/2019: HbA1c calculated from fructosamine: 6.3%  Pump settings: - basal: 12 AM: 1.15 >> 1.30 3 AM: 1.10 >> 1.15 >> 1.30 4 AM: 1.30 6:30 AM: 1.60 >> 1.50 8 AM: 1.90 >> 1.75 10 AM: 1.65  12 PM: 1.45 >> 1:35 4 PM: 1.35 >> 1.15 8 pm: 1.60 >> 1.55 >> 1.20 9 PM: 1.55 >> 1.20 10:30 PM: 1.45 >> 1.15 - bolus:  - Insulin to carb ratio: 1:3, except 4-10:30 pm: 1:2.5 >> 12 am: 1:4 8 am: 1:3 12 pm: 1:4, but usually does manual boluses - Insulin sensitivity factor:  25  - target: 120-120 - Active insulin time: 3 hours  TDD basal: 33% >> 33% >> 40% TDD bolus: 67% >> 67% >> 60% TDD 122 units >> 210-240  >> 117-140 >> 85-115 units a day - Bolus wizard: On - Changes the pump site:  every 1-2 >> every 2-3 days   He is also on:  - Metformin 500 >> 1000 mg twice a day - Actos 45 mg daily-started 12/2019 >> decreased to 22.5 mg in 01/2020 >> stopped - Farxiga 5 mg before breakfast-started 07/2021 - Ozempic 0.5 mg weekly - [facility] We tried Trulicy in 09/2018 but he stopped in 02/2019 due to nausea  and abdominal pain.  He checks his sugars more than 4 times a day with his Dexcom CGM:   Previously:   Previously:   Lowest: 40 >> 40 >> 50s  >> 50 >> 50s. Highest: 380 >> 400 >> 300s >> 300 >> 300s. He has a history of a severe hypoglycemic episode in 2016.  He has a glucagon kit at home (refilled by the [place]). No previous DKA admissions.  -No CKD, last BUN/creatinine:  Lab Results  Component Value Date   BUN 12 06/17/2023   CREATININE 0.80 06/17/2023   Lab Results  Component Value Date   MICRALBCREAT Unable to calculate 08/08/2023   MICRALBCREAT 0.0 10/29/2022   MICRALBCREAT 1.0 01/27/2022   MICRALBCREAT 0.7 10/28/2020   MICRALBCREAT 1.0 10/23/2019   MICRALBCREAT 0.8 10/19/2018   MICRALBCREAT 0.8 10/03/2017   MICRALBCREAT 0.8 03/01/2016   MICRALBCREAT 0.4 01/14/2015   MICRALBCREAT 0.4 02/05/2013  On Cozaar.  -+  HL; last set of lipids: Lab Results  Component Value Date   CHOL 95 10/29/2022   HDL 37 10/29/2022   LDLCALC 42 10/29/2022   LDLDIRECT 87.0 12/02/2016   TRIG 99 10/29/2022   CHOLHDL 2 01/27/2022  On high-dose Lipitor.  - last eye exam was: No Retinopathy 10/06/2022 Previously + Mild NPDR. [facility].   -+ Numbness and tingling in his feet.  This has improved on the B complex.  Last foot exam-02/18/2023. B12 was normal, 855, on 01/28/2021.  He also has a history of GERD, HTN, OSA.  Latest TSH was normal: Lab Results  Component Value Date   TSH 3.30 08/08/2023  03/19/2022: TSH 2.47 04/11/2018: TSH 3.02  He has a history of adhesive capsulitis >> improved.  Previously getting occasional steroid injections but we discussed to reduce these as much as possible.  On meloxicam.  ROS: + see HPI  I reviewed pt's medications, allergies, PMH, social hx, family hx, and changes were documented in the history of present illness. Otherwise, unchanged from my initial visit note.  Past Medical History:  Diagnosis Date   Diabetes mellitus    GERD (gastroesophageal reflux disease)    Heart murmur    Hyperlipidemia    Hypertension    OSA (obstructive sleep apnea)    Pneumonia    Past Surgical History:  Procedure Laterality Date   CERVICAL DISC ARTHROPLASTY N/A 03/16/2017   Procedure: CERVICAL FIVE- CERVICAL SIX DISC ARTHROPLASTY;  Surgeon: Shirlean Kelly, MD;  Location: [facility] OR;  Service: Neurosurgery;  Laterality: N/A;  CERVICAL 5- CERVICAL 6 DISC ARTHROPLASTY   COLONOSCOPY     TONSILLECTOMY     Social History   Socioeconomic History   Marital status: Married    Spouse name: Not on file   Number of children: 3   Years of education: Not on file   Highest education level: Master's degree (e.g., MA, MS, MEng, MEd, MSW, MBA)  Occupational History   Occupation: RADIATION SAFETY OFFICER    Employer: [employer]  Tobacco Use   Smoking status: Never    Passive exposure: Never    Smokeless tobacco: Never  Vaping Use   Vaping status: Never Used  Substance and Sexual Activity   Alcohol use: No    Alcohol/week: 0.0 standard drinks of alcohol   Drug use: No   Sexual activity: Yes    Partners: Female  Other Topics Concern   Not on file  Social History Narrative   Regular exercise: runs 3 miles 3x week, takes one day off per week to  rest   Caffeine use: stopped all diet soda on 04/23/15 per advice from Sports MD   Social Drivers of Health   Financial Resource Strain: Low Risk  (08/07/2023)   Overall Financial Resource Strain (CARDIA)    Difficulty of Paying Living Expenses: Not very hard  Food Insecurity: No Food Insecurity (08/07/2023)   Hunger Vital Sign    Worried About Running Out of Food in the Last Year: Never true    Ran Out of Food in the Last Year: Never true  Transportation Needs: No Transportation Needs (08/07/2023)   PRAPARE - Administrator, Civil Service (Medical): No    Lack of Transportation (Non-Medical): No  Physical Activity: Insufficiently Active (08/07/2023)   Exercise Vital Sign    Days of Exercise per Week: 2 days    Minutes of Exercise per Session: 30 min  Stress: No Stress Concern Present (08/07/2023)   [employer] of Occupational Health - Occupational Stress Questionnaire    Feeling of Stress : Only a little  Social Connections: Socially Integrated (08/07/2023)   Social Connection and Isolation Panel [NHANES]    Frequency of Communication with Friends and Family: Three times a week    Frequency of Social Gatherings with Friends and Family: Twice a week    Attends Religious Services: 1 to 4 times per year    Active Member of [employer] or Organizations: Yes    Attends Engineer, structural: More than 4 times per year    Marital Status: Married  Catering manager Violence: Not on file   Current Outpatient Medications on File Prior to Visit  Medication Sig Dispense Refill   Alpha-Lipoic Acid 600 MG CAPS Take 1  capsule by mouth daily.      Ascorbic Acid (VITAMIN C) 1000 MG tablet Take 1,000 mg by mouth daily.     aspirin EC 81 MG tablet Take by mouth daily.     atorvastatin (LIPITOR) 80 MG tablet Take 1 tablet (80 mg total) by mouth daily. 90 tablet 1   azelastine (ASTELIN) 0.1 % nasal spray Place 2 sprays into both nostrils 2 (two) times daily. Use in each nostril as directed 90 mL 1   b complex vitamins tablet Take 1 tablet by mouth daily.     BAYER MICROLET LANCETS lancets USE TO TEST BLOOD SUGAR 6 TIMES A DAY 300 each 11   Beclomethasone Dipropionate (QNASL) 80 MCG/ACT AERS Place 2 puffs into the nose daily as needed (allergies). 10.6 g 5   Calcium-Magnesium-Vitamin D (CALCIUM 500 PO) Take 1 tablet by mouth daily.      clotrimazole-betamethasone (LOTRISONE) cream Apply to affected areas 2 (two) times daily. STOP USING AFTER 2 WEEKS 45 g 1   Continuous Glucose Sensor (DEXCOM G7 SENSOR) MISC Apply 1 sensor every 10 days 9 each 3   cyclobenzaprine (FLEXERIL) 10 MG tablet Take 1 tablet (10 mg total) by mouth 3 (three) times daily as needed for muscle spasms. 30 tablet 0   dapagliflozin propanediol (FARXIGA) 5 MG TABS tablet Take 1 tablet (5 mg total) by mouth daily before breakfast. 90 tablet 3   EPINEPHrine 0.3 mg/0.3 mL IJ SOAJ injection Inject 0.3 mg into the muscle as needed for anaphylaxis. 1 each PRN   famotidine (PEPCID) 40 MG tablet Take 1 tablet by mouth at bedtime.     ferrous sulfate 325 (65 FE) MG tablet Take 1 tablet (325 mg total) by mouth daily with breakfast. 90 tablet 0   Glucagon 3 MG/DOSE POWD  Place 3 mg into the nose once as needed for up to 1 dose. 1 each 11   glucose blood (CONTOUR NEXT TEST) test strip USE AS INSTRUCTED TO CHECK SUGAR 3 TIMES DAILY 100 strip 3   GVOKE HYPOPEN 1-PACK 1 MG/0.2ML SOAJ Inject under skin 0.2 mL as needed for hypoglycemia 0.2 mL PRN   hydrOXYzine (ATARAX) 10 MG tablet Take 1 tablet by mouth at bedtime as needed. 90 tablet 0   insulin glargine-yfgn  (SEMGLEE) 100 UNIT/ML injection Inject into the skin.     insulin lispro (HUMALOG) 100 UNIT/ML injection Inject up to 150 units into insulin pump daily. 50 mL 3   ipratropium (ATROVENT) 0.06 % nasal spray Place 2 sprays into the nose as needed for rhinitis.     levocetirizine (XYZAL) 5 MG tablet TAKE 1 TABLET BY MOUTH IN THE EVENING 90 tablet 1   losartan (COZAAR) 50 MG tablet Take 1 tablet (50 mg total) by mouth daily. 90 tablet 1   Magnesium Oxide 400 MG CAPS Take 1 capsule (400 mg total) by mouth daily. 90 capsule 1   metFORMIN (GLUCOPHAGE) 500 MG tablet Take 2 tablets (1,000 mg total) by mouth 2 (two) times daily before a meal. 360 tablet 3   metoprolol succinate (TOPROL XL) 25 MG 24 hr tablet Take 0.5 tablets (12.5 mg total) by mouth daily. 45 tablet 3   NON FORMULARY CPAP     omeprazole (PRILOSEC) 40 MG capsule Take 20 mg by mouth daily.     Semaglutide,0.25 or 0.5MG /DOS, 2 MG/3ML SOPN Inject into the skin once a week.     traZODone (DESYREL) 50 MG tablet Take 1 tablet (50 mg total) by mouth at bedtime. 90 tablet 1   [DISCONTINUED] insulin aspart (NOVOLOG) 100 UNIT/ML injection Inject max 150 units daily 50 mL 3   No current facility-administered medications on file prior to visit.   Allergies  Allergen Reactions   Lisinopril Swelling and Other (See Comments)    Extreme facial swelling causing hospitalization   Shellfish Allergy Anaphylaxis   Zetia [Ezetimibe] Other (See Comments)    ABDOMINAL CRAMPING   Dulaglutide Other (See Comments)   Gabapentin     Other reaction(s): Dizziness   Gabapentin (Once-Daily) Other (See Comments)   Iodine    Penicillins Other (See Comments)    UNSPECIFIED REACTION > Childhood allergy     Cymbalta [Duloxetine Hcl] Other (See Comments)    Severe feet cramps   Family History  Problem Relation Age of Onset   Colon cancer Maternal Grandfather    Colon cancer Paternal Grandfather    Heart disease Father        Valve replacement; pacemaker    Alcohol abuse Neg Hx    Asthma Neg Hx    Diabetes Neg Hx    Hyperlipidemia Neg Hx    Hypertension Neg Hx    Kidney disease Neg Hx    Stroke Neg Hx    PE: BP 130/70   Pulse 63   Resp 16   Ht 5\' 9"  (1.753 m)   Wt 221 lb 6.4 oz (100.4 kg)   SpO2 96%   BMI 32.70 kg/m    Wt Readings from Last 10 Encounters:  08/12/23 221 lb 6.4 oz (100.4 kg)  08/08/23 219 lb 6.4 oz (99.5 kg)  07/27/23 221 lb (100.2 kg)  06/22/23 224 lb (101.6 kg)  06/17/23 225 lb 6.4 oz (102.2 kg)  05/04/23 237 lb (107.5 kg)  03/10/23 235 lb (106.6 kg)  02/18/23  235 lb 6.4 oz (106.8 kg)  02/02/23 238 lb (108 kg)  01/27/23 239 lb (108.4 kg)   Constitutional: overweight, in NAD Eyes: EOMI, no exophthalmos ENT:  no thyromegaly, no cervical lymphadenopathy Cardiovascular: RRR, No MRG Respiratory: CTA B Musculoskeletal: no deformities Skin: no rashes Neurological: no tremor with outstretched hands  ASSESSMENT: 1. DM1, insulin-dependent, uncontrolled, without complications - DR - PN  - Barriers to good control: Very busy schedule >> less time to check sugars and eat but But he does a great job with bolusing during the day >> still has a lot of large boluses during the day, sometimes without documented carbs or sugars Lack of sleep >> usually sleeps max 5h a night (!) and tries to catch up in the weekend Reaches home late at night  - sometimes at 1 am >> eats >65% of the daily calories then  2. HL  PLAN:  1. Patient with longstanding, uncontrolled, type 1 diabetes, with high insulin resistance, managed on insulin pump.  He was previously on Medtronic insulin pump but now on the t:slim X2 insulin pump integrated with the Dexcom CGM.  He is also on metformin and SGLT2 inhibitor.  Previously on Actos, which we stopped.  HbA1c at last check was stable, 8.0%, earlier this month. -At last visit, reviewing the CGM trends, sugars were fluctuating mostly within the target range but increasing after approximately 6 PM  and improving in the second half of the night.  His insulin to carb ratios were not stronger in the afternoon as suggested at the previous visit and I advised him to try to strengthen these between 4 PM and 10:30 PM.  We did not change the rest of the settings.  The sugars did appear to be improved, more than expected from the HbA1c. -In the past, after starting Adderall, he had an improvement in blood sugars.  Unfortunately, he had to come off Adderall since last visit due to PVCs. CGM interpretation: -At today's visit, we reviewed his CGM downloads: It appears that 68% of values are in target range (goal >70%), while 32% are higher than 180 (goal <25%), and 0% are lower than 70 (goal <4%).  The calculated average blood sugar is 165.  The projected HbA1c for the next 3 months (GMI) is 7.3%. -Reviewing the CGM trends, sugars appear to be improved in the last 2 weeks compared to the previous 2 weeks.  Upon questioning, she was able to [facility].  He started on low-dose and increase to 0.5 mg weekly.  He has quite a bit of nausea at the beginning but this started to improve.  He still cannot eat fried foods or larger meals.  He had to adjust his insulin doses down on Ozempic. -His sugars still appear to increase after lunch but upon reviewing daily pump downloads, she is not entering carbs and bolusing appropriately for meals.  He does manual boluses and manual corrections but we discussed why these are not ideal.  He feels that whenever entering all of the carbs he is eating, he may drop his blood sugars.  Especially now on Ozempic, we discussed that we need to relax his insulin to carb ratios.  Since he plans on increasing the dose of Ozempic soon, I advised him that he may need to relax them even more afterwards.  As he feels that his sugars are increasing during the afternoon, I advised him to increase the basal rates from 10  AM to midnight, as he did adjust these down  since last visit. - I advised him to:  Patient Instructions  Please use the following pump settings: - basal: 12 AM: 1.30 3 AM: 1.30 6:30 AM: 1.50 8 AM: 1.75 10 AM: 1.65 >> 1.75 12 PM: 1:35 >> 1:45 4 PM: 1.15 >> 1.25 8 pm: 1.20 >> 1.30 10:30 PM: 1.15 >> 1.25 - bolus:  - Insulin to carb ratio:  12 am: 1:4 >> 1:5 8 am: 1:3 >> 1:5 12 pm: 1:4 >> 1.5 (After increasing Ozempic, you may need to change this to 1:6) - Insulin sensitivity factor:  25  - target: 120-120 - Active insulin time: 3 hours  Please continue: - Metformin 1000 mg 2x with meals  - Farxiga 5 mg before breakfast - Ozempic 0.5 mg weekly  Please come back for a follow-up appointment in 4-6 months.  - advised to check sugars at different times of the day - 4x a day, rotating check times - advised for yearly eye exams >> he is UTD - return to clinic in 3-4 months  2. HL -Latest lipid panel was reviewed from last year, all fractions at goal: Lab Results  Component Value Date   CHOL 95 10/29/2022   HDL 37 10/29/2022   LDLCALC 42 10/29/2022   LDLDIRECT 87.0 12/02/2016   TRIG 99 10/29/2022   CHOLHDL 2 01/27/2022  -He is on Lipitor 80 mg daily-no side effects  Carlus Pavlov, MD PhD [facility]

## 2023-08-13 ENCOUNTER — Other Ambulatory Visit: Payer: Self-pay | Admitting: Internal Medicine

## 2023-08-13 DIAGNOSIS — F320 Major depressive disorder, single episode, mild: Secondary | ICD-10-CM

## 2023-08-15 ENCOUNTER — Other Ambulatory Visit (HOSPITAL_COMMUNITY): Payer: Self-pay

## 2023-08-15 MED ORDER — TRAZODONE HCL 50 MG PO TABS
50.0000 mg | ORAL_TABLET | Freq: Every day | ORAL | 1 refills | Status: DC
  Filled 2023-09-19: qty 90, 90d supply, fill #0
  Filled 2023-12-19: qty 90, 90d supply, fill #1

## 2023-08-16 NOTE — Therapy (Signed)
 OUTPATIENT PHYSICAL THERAPY TREATMENT    Patient Name: Zachary Graham MRN: [id number] DOB:1960-12-23, 63 y.o., male Today's Date: 08/17/2023  END OF SESSION:  PT End of Session - 07/25/23 1319     Visit Number 4    Number of Visits 13    Date for PT Re-Evaluation 08/26/23    Authorization Type [employer] [place] [employer] SAVE    PT Start Time PT end time Total  1317  1400 43             Past Medical History:  Diagnosis Date   Diabetes mellitus    GERD (gastroesophageal reflux disease)    Heart murmur    Hyperlipidemia    Hypertension    OSA (obstructive sleep apnea)    Pneumonia    Past Surgical History:  Procedure Laterality Date   CERVICAL DISC ARTHROPLASTY N/A 03/16/2017   Procedure: CERVICAL FIVE- CERVICAL SIX DISC ARTHROPLASTY;  Surgeon: Shirlean Kelly, MD;  Location: [facility] OR;  Service: Neurosurgery;  Laterality: N/A;  CERVICAL 5- CERVICAL 6 DISC ARTHROPLASTY   COLONOSCOPY     TONSILLECTOMY     Patient Active Problem List   Diagnosis Date Noted   Glaucomatous optic atrophy, bilateral 08/08/2023   Fibroma of foot 08/03/2023   Preglaucoma, unspecified, bilateral 01/14/2022   Seborrheic keratosis 01/14/2022   Type 1 diabetes mellitus with hyperglycemia (HCC) 01/14/2022   Vasomotor rhinitis 01/14/2022   Adult attention deficit hyperactivity disorder 01/14/2022   Allergy to shellfish 01/14/2022   Dysphagia, oropharyngeal phase 01/14/2022   Gastro-esophageal reflux disease without esophagitis 01/14/2022   Low back pain 10/29/2021   Iron deficiency anemia secondary to inadequate dietary iron intake 02/02/2021   Frequent PVCs 10/28/2020   Hypertension    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) 03/20/2018   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 diabetes mellitus (HCC) 08/31/2017   Mild cataract of right eye 08/31/2017   HNP (herniated nucleus pulposus), cervical 03/16/2017    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 (HCC) 01/06/2017   Herniation of cervical intervertebral disc with radiculopathy 01/04/2017   Current mild episode of major depressive disorder without prior episode (HCC) 12/02/2016   Diabetic neuropathy (HCC) 03/01/2016   Obesity, Class II, BMI 35.0-39.9, with comorbidity (see actual BMI) 04/24/2015   Type 1 diabetes mellitus with hyperglycemia, with long-term current use of insulin (HCC) 04/04/2015   Patellofemoral stress syndrome of left knee 03/19/2015   Allergic rhinitis due to pollen 11/14/2014   Equinus deformity of foot, acquired 05/29/2014   ADHD (attention deficit hyperactivity disorder), inattentive type 10/24/2013   Vitamin D deficiency 02/28/2013   Pulmonic stenosis 05/25/2012   Pure hyperglyceridemia 05/25/2012   Hyperlipidemia with target LDL less than 100 09/20/2011   OSA (obstructive sleep apnea) 09/20/2011   Routine general medical examination at a health care facility 09/20/2011    PCP: [name], MD   REFERRING PROVIDER: Judi Saa, DO  REFERRING DIAG: M72.2 (ICD-10-CM) - Plantar fasciitis, left   THERAPY DIAG:  Pain in left foot  Decreased range of motion of left ankle  Difficulty in walking, not elsewhere classified  Rationale for Evaluation and Treatment: Rehabilitation  ONSET DATE: 9 weeks  SUBJECTIVE:   SUBJECTIVE STATEMENT: Pt's reports his foot pain continues to be a lot better. It is still tender to touch.  EVAL: Pt reports an insidious onset of L foot pain. Pt notes using a carbon fiber insert,  as well as orthotic insert together, have helped to make walking more comfortable. Pt states after experiencing increases in L foot pain, rest alone will lower the pain to a very low ache in a short time frame.  PERTINENT HISTORY: High BMI; See PMH  PAIN:  Are you having pain? Yes: NPRS scale: 0/10: pain range the week prior to start of PT: 1-8/10 Pain  location: Ache of the foot Pain description: Sharp, ache Aggravating factors: Prolonged walking and certain movements Relieving factors: Rest  PRECAUTIONS: None  RED FLAGS: None   WEIGHT BEARING RESTRICTIONS: No  FALLS:  Has patient fallen in last 6 months? 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 Katrinka Blazing 3/19  OBJECTIVE:  Note: Objective measures were completed at Evaluation unless otherwise noted.  DIAGNOSTIC FINDINGS:  L ankle DG 06/22/23 FINDINGS: There is no evidence of fracture, dislocation, or joint effusion. There is no evidence of arthropathy or other focal bone abnormality. Soft tissues are unremarkable.   IMPRESSION: Negative.  PATIENT SURVEYS:  LEFS 42/80- Mild to mod functional limitation  COGNITION: Overall cognitive status: Within functional limits for tasks assessed     SENSATION: WFL  EDEMA:  None observed or palpated  MUSCLE LENGTH: Hamstrings: Right 30 deg; Left 30 deg Thomas test: Right tight deg; Left tight deg  POSTURE:  Out toeing, normal arch  PALPATION: TTP arch of L foot  LOWER EXTREMITY ROM:   Active ROM Right eval Left eval Right  07/25/23 Left  07/25/23  Hip flexion      Hip extension      Hip abduction      Hip adduction      Hip internal rotation      Hip external rotation      Knee flexion      Knee extension      Ankle dorsiflexion 7 knee extended 7 knee extended 5 2  Ankle plantarflexion      Ankle inversion      Ankle eversion       (Blank rows = not tested)  LOWER EXTREMITY MMT:  MMT Right eval Left eval  Hip flexion 4+ 4+  Hip extension 4+ 4+  Hip abduction 5 5  Hip adduction 5 5  Hip internal rotation 5 5  Hip external rotation 5 5  Knee flexion 5 5  Knee extension 5 5  Ankle dorsiflexion 5 5  Ankle plantarflexion 5 5  Ankle  inversion 5 5  Ankle eversion 5 5   (Blank rows = not tested)  LOWER EXTREMITY SPECIAL TESTS:  NT  FUNCTIONAL TESTS:  2 minute walk test: TBA SLS 8 sec  07/28/23  SLS Left 25 sec , SLS right 60  GAIT: Distance walked: 200' Assistive device utilized: None Level of assistance: Complete Independence Comments: Decreased push off L when barefooted  TREATMENT DATE:  [facility] Adult PT Treatment:                                                DATE: 08/17/23  Therapeutic Exercise: Rec bike 5 mins L3 Ankle DF, Ev, and Inv RTB 3x12 each Manual Therapy: STM and CFM to the L plantar foot- fascia and tendons  [facility] Adult PT Treatment:                                                DATE: 08/11/23 Manual Therapy: STM to the L gastroc and soleus muscles Skilled palpation to the L calf to identify Tps and taut muscle bands Trigger Point Dry Needling Initial Treatment: Pt instructed on Dry Needling rational, procedures, and possible side effects. Pt instructed to expect mild to moderate muscle soreness later in the day and/or into the next day.  Pt instructed in methods to reduce muscle soreness. Pt instructed to continue prescribed HEP. Because Dry Needling was performed over or adjacent to a lung field, pt was educated on S/S of pneumothorax and to seek immediate medical attention should they occur.  Patient was educated on signs and symptoms of infection and other risk factors and advised to seek medical attention should they occur.  Patient verbalized understanding of these instructions and education.   Patient Verbal Consent Given: Yes Education Handout Provided: Yes Muscles Treated: L gastroc and soleus Electrical Stimulation Performed: No Treatment Response/Outcome: Twitch responses   Therapeutic Exercise: Slant board stretch gastroc stretch / soleus stretch c towel roll at  toes for plantar fascia Seated hamstring and calf stretch c strap Heel raises 2x10 on 2 inch riser Therapeutic Activity: SL balance on airex Rocker board AP and laterally  [facility] Adult PT Treatment:                                                DATE: 08/04/23 Therapeutic Exercise: Rec bike for 5 mins L3 Slant board stretch gastroc stretch / soleus stretch c towel roll at toes for plantar fascia Seated hamstring and calf stretch c strap Ankle DF, Ev, and Inv RTB 2x12 each Manual Therapy: STM to the L gastroc and soleus muscles with TPR to the lateral gastroc  [facility] Adult PT Treatment:                                                DATE: 07/28/23 Therapeutic Exercise: Supine h/s stretch x 3 each  Slant board stretch gastroc stretch / soleus stretch Slant board board heel raise x 8 Seated Marble pick up x 20 Seated toe extension AROM  Modalities: Ionto patch to plantar fascia 1ml dexamethasone - 4-6 hour wear time Ice pack to bottom of foot x 5 minutes , hooklying    PATIENT EDUCATION:  Education details: Eval findings, POC, HEP, self care  Person educated: Patient Education method: Explanation, Demonstration, Tactile cues, Verbal cues, and Handouts Education comprehension: verbalized understanding, returned demonstration, verbal  cues required, and tactile cues required  HOME EXERCISE PROGRAM: Access Code: [email or web address] URL: [email or web address] Date: 07/22/2023 Prepared by: Joellyn Rued  Exercises - Long Sitting Calf Stretch with Strap  - 1 x daily - 7 x weekly - 1 sets - 5 reps - 20 hold - Gastroc Stretch on Wall  - 1 x daily - 7 x weekly - 1 sets - 3 reps - 30 hold - Seated Plantar Fascia Stretch  - 1 x daily - 7 x weekly - 1 sets - 1-3 reps - 30 hold - Seated Hamstring Stretch with Strap  - 1 x daily - 7 x weekly - 3 sets - 10 reps - Heel Raises with Counter Support  - 1 x daily - 7 x weekly - 3 sets - 10 reps - 3 hold - Single Leg Stance  - 1 x daily - 7 x weekly -  1 sets - 3 reps - 30 hold - Toe Yoga - Alternating Great Toe and Lesser Toe Extension  - 1 x daily - 7 x weekly - 2 sets - 20 reps - Seated Toe Towel Scrunches  - 1 x daily - 7 x weekly - 2 sets - 10 reps  ASSESSMENT:  CLINICAL IMPRESSION: STM revealed an area of induration/nodule in the area of the PT, FDL, and FHL tendons. Palpation is very tender to this area. STM was f/b therex for foot and ankle strengthening. TPDN to the gastroc and soleus muscles the last PT session did not result in a change in the L plantar foot pain. Overall, pt is experiencing good relief fo L foot and with increased function. Pt may benefit from TPDN to the PT, FDL, FHL muscles. Pt is going the address the area of concern with his next MD appt with Dr. Katrinka Blazing. Pt will continue to benefit from skilled PT to address impairments for improved L foot function with minimized pain.   EVAL: Patient is a 63 y.o. male who was seen today for physical therapy evaluation and treatment for M72.2 (ICD-10-CM) - Plantar fasciitis, left . Pt presents with signs and symptoms consistent with the referring Dx. Pt has decreased ankle DF AROM, markedly decreased hamstring flexibility, and is significantly TTP of the arch of the L foot. A HEP for stretching, and strengthening (tissue remodeling) was initiated. Pt will benefit from skilled PT 2w6 to address impairments to optimize L foot function with less pain.   OBJECTIVE IMPAIRMENTS: decreased activity tolerance, difficulty walking, decreased ROM, increased fascial restrictions, obesity, and pain.   ACTIVITY LIMITATIONS: carrying, lifting, standing, stairs, and locomotion level  PARTICIPATION LIMITATIONS: meal prep, cleaning, shopping, community activity, and occupation  PERSONAL FACTORS: Past/current experiences and 1 comorbidity: high BMI  are also affecting patient's functional outcome.   REHAB POTENTIAL: Good  CLINICAL DECISION MAKING: Stable/uncomplicated  EVALUATION COMPLEXITY:  Low   GOALS:  LONG TERM GOALS: Target date: 08/26/23  Pt will be Ind in a final HEP to maintain achieved LOF  Baseline: initiated Goal status: INITIAL  2.  Increase L ankle DF AROM c ext knee to 12d or greater to minimize plantar fascia strain Baseline: 7d Goal status: INITIAL  3.  Increase L hamstring flexibility to 40d or greater to minimize posterior LE muscular strain Baseline: 30d 07/25/23: 35d right, 50 deg  Goal status: INITIAL  4.  Pt will report a 50% or greater improvement in L foot pain for improved function and QOL Baseline: 3/10 ave; 1-8/10 08/11/23: 96% improvement Goal status: INITIAL  5.  Marland Kitchen  Improve by MCID of 67ft as indication of improved functional mobility if pt scores below standard for age Baseline: TBA Goal status: INITIAL   PLAN:  PT FREQUENCY: 2x/week  PT DURATION: 6 weeks  PLANNED INTERVENTIONS: 97164- PT Re-evaluation, 97110-Therapeutic exercises, 97530- Therapeutic activity, [id number]- Neuromuscular re-education, 97535- Self Care, [id number]- Manual therapy, [id number]- Gait training, [phone]- Electrical stimulation (unattended), [id number]- Electrical stimulation (manual), [id number]- Ionotophoresis 4mg /ml Dexamethasone, Patient/Family education, Taping, Dry Needling, Joint mobilization, Cryotherapy, and Moist heat  PLAN FOR NEXT SESSION: Assess ; assess response to HEP; progress therex as indicated; use of modalities, manual therapy; and TPDN as indicated.  Hridhaan Yohn MS, PT 08/17/23 11:13 AM

## 2023-08-17 ENCOUNTER — Encounter: Payer: Self-pay | Admitting: Family Medicine

## 2023-08-17 ENCOUNTER — Ambulatory Visit: Attending: Family Medicine

## 2023-08-17 DIAGNOSIS — R262 Difficulty in walking, not elsewhere classified: Secondary | ICD-10-CM | POA: Insufficient documentation

## 2023-08-17 DIAGNOSIS — M79672 Pain in left foot: Secondary | ICD-10-CM | POA: Diagnosis not present

## 2023-08-17 DIAGNOSIS — M25672 Stiffness of left ankle, not elsewhere classified: Secondary | ICD-10-CM | POA: Insufficient documentation

## 2023-08-18 ENCOUNTER — Other Ambulatory Visit: Payer: Self-pay

## 2023-08-18 DIAGNOSIS — M79672 Pain in left foot: Secondary | ICD-10-CM

## 2023-08-20 ENCOUNTER — Ambulatory Visit
Admission: RE | Admit: 2023-08-20 | Discharge: 2023-08-20 | Disposition: A | Discharge status: Home / Self Care | Source: Ambulatory Visit | Attending: Family Medicine | Admitting: Family Medicine

## 2023-08-20 DIAGNOSIS — M79672 Pain in left foot: Secondary | ICD-10-CM

## 2023-08-26 ENCOUNTER — Other Ambulatory Visit: Payer: Self-pay | Admitting: Internal Medicine

## 2023-08-26 ENCOUNTER — Other Ambulatory Visit (HOSPITAL_COMMUNITY): Payer: Self-pay

## 2023-08-26 ENCOUNTER — Other Ambulatory Visit: Payer: Self-pay

## 2023-08-26 DIAGNOSIS — J301 Allergic rhinitis due to pollen: Secondary | ICD-10-CM

## 2023-08-26 MED ORDER — LEVOCETIRIZINE DIHYDROCHLORIDE 5 MG PO TABS
ORAL_TABLET | Freq: Every evening | ORAL | 1 refills | Status: DC
  Filled 2023-08-26: qty 90, 90d supply, fill #0
  Filled 2023-11-21: qty 90, 90d supply, fill #1

## 2023-08-31 NOTE — Therapy (Incomplete)
 OUTPATIENT PHYSICAL THERAPY TREATMENT/Discharge/Re-Cert   Patient Name: Zachary Graham MRN: [id number] DOB:10-06-1960, 63 y.o., male Today's Date: 09/01/2023  END OF SESSION:    08/04/2023    09/01/23      1731  PT Visits / Re-Eval   Visit Number    9  Number of Visits    13  Date for PT Re-Evaluation    09/01/2023  Authorization   Authorization Type    [place] [employer]  Authorization Time Period -- -- -- --  Authorization - Visit Number -- -- -- --  Authorization - Number of Visits -- -- -- --  Progress Note Due on Visit -- -- -- --  PT Time Calculation   PT Start Time    1631  PT Stop Time    1716  PT Time Calculation (min)    45 min  PT - End of Session   Equipment Utilized During Treatment -- -- -- --  Activity Tolerance l   Patient tolerated treatment well  Behavior During Therapy    WFL for tasks assessed/performed    Past Medical History:  Diagnosis Date   Diabetes mellitus    GERD (gastroesophageal reflux disease)    Heart murmur    Hyperlipidemia    Hypertension    OSA (obstructive sleep apnea)    Pneumonia    Past Surgical History:  Procedure Laterality Date   CERVICAL DISC ARTHROPLASTY N/A 03/16/2017   Procedure: CERVICAL FIVE- CERVICAL SIX DISC ARTHROPLASTY;  Surgeon: Shirlean Kelly, MD;  Location: [facility] OR;  Service: Neurosurgery;  Laterality: N/A;  CERVICAL 5- CERVICAL 6 DISC ARTHROPLASTY   COLONOSCOPY     TONSILLECTOMY     Patient Active Problem List   Diagnosis Date Noted   Glaucomatous optic atrophy, bilateral 08/08/2023   Fibroma of foot 08/03/2023   Preglaucoma, unspecified, bilateral 01/14/2022   Seborrheic keratosis 01/14/2022   Type 1 diabetes mellitus with hyperglycemia (HCC) 01/14/2022   Vasomotor rhinitis 01/14/2022   Adult attention deficit hyperactivity disorder 01/14/2022   Allergy to shellfish 01/14/2022   Dysphagia, oropharyngeal phase 01/14/2022   Gastro-esophageal reflux disease without esophagitis 01/14/2022   Low back  pain 10/29/2021   Iron deficiency anemia secondary to inadequate dietary iron intake 02/02/2021   Frequent PVCs 10/28/2020   Hypertension    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) 03/20/2018   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 diabetes mellitus (HCC) 08/31/2017   Mild cataract of right eye 08/31/2017   HNP (herniated nucleus pulposus), cervical 03/16/2017   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 (HCC) 01/06/2017   Herniation of cervical intervertebral disc with radiculopathy 01/04/2017   Current mild episode of major depressive disorder without prior episode (HCC) 12/02/2016   Diabetic neuropathy (HCC) 03/01/2016   Obesity, Class II, BMI 35.0-39.9, with comorbidity (see actual BMI) 04/24/2015   Type 1 diabetes mellitus with hyperglycemia, with long-term current use of insulin (HCC) 04/04/2015   Patellofemoral stress syndrome of left knee 03/19/2015   Allergic rhinitis due to pollen 11/14/2014   Equinus deformity of foot, acquired 05/29/2014   ADHD (attention deficit hyperactivity disorder), inattentive type 10/24/2013   Vitamin D deficiency 02/28/2013   Pulmonic stenosis 05/25/2012   Pure hyperglyceridemia 05/25/2012   Hyperlipidemia with target LDL less than 100 09/20/2011   OSA (obstructive sleep apnea) 09/20/2011   Routine general medical examination at a health care facility 09/20/2011    PCP:  [name], MD  REFERRING PROVIDER: Judi Saa, DO  REFERRING DIAG: [phone] (ICD-10-CM) - Plantar fasciitis, left   THERAPY DIAG:  Pain in left foot  Decreased range of motion of left ankle  Difficulty in walking, not elsewhere classified  Rationale for Evaluation and Treatment: Rehabilitation  ONSET DATE: 9 weeks  SUBJECTIVE:   SUBJECTIVE STATEMENT: Pt's reports his foot pain continues to be 95% better. He experiences the  most discomfort.when barefoot and the stretching exercises help the most. Pt notices having some balance issues in single leg standing.   EVAL: Pt reports an insidious onset of L foot pain. Pt notes using a carbon fiber insert, as well as orthotic insert together, have helped to make walking more comfortable. Pt states after experiencing increases in L foot pain, rest alone will lower the pain to a very low ache in a short time frame.  PERTINENT HISTORY: High BMI; See PMH  PAIN:  Are you having pain? Yes: NPRS scale: 0/10: pain range the week prior to start of PT: 1-8/10 Pain location: Ache of the foot Pain description: Sharp, ache Aggravating factors: Prolonged walking and certain movements Relieving factors: Rest  PRECAUTIONS: None  RED FLAGS: None   WEIGHT BEARING RESTRICTIONS: No  FALLS:  Has patient fallen in last 6 months? 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 Katrinka Blazing 3/19  OBJECTIVE:  Note: Objective measures were completed at Evaluation unless otherwise noted.  DIAGNOSTIC FINDINGS:  L ankle DG 06/22/23 FINDINGS: There is no evidence of fracture, dislocation, or joint effusion. There is no evidence of arthropathy or other focal bone abnormality. Soft tissues are unremarkable.   IMPRESSION: Negative.  PATIENT SURVEYS:  LEFS 42/80- Mild to mod functional limitation  COGNITION: Overall cognitive status: Within functional limits for tasks assessed     SENSATION: WFL  EDEMA:  None observed or palpated  MUSCLE LENGTH: Hamstrings: Right 30 deg; Left 30 deg  08/31/23: L 45d, R 45d Thomas test: Right tight deg; Left tight deg  POSTURE:  Out toeing, normal arch  PALPATION: TTP arch of L foot  LOWER EXTREMITY ROM:   Active ROM Right eval Left eval Right  07/25/23 Left  07/25/23  Lt 09/01/23  Hip flexion       Hip extension       Hip abduction       Hip adduction       Hip internal rotation       Hip external rotation       Knee flexion       Knee extension       Ankle dorsiflexion 7 knee extended 7 knee extended 5 2 12   Ankle plantarflexion       Ankle inversion       Ankle eversion        (Blank rows = not tested)  LOWER EXTREMITY MMT:  MMT Right eval Left eval  Hip flexion 4+ 4+  Hip extension 4+ 4+  Hip abduction 5 5  Hip adduction 5 5  Hip internal rotation 5 5  Hip external rotation 5 5  Knee flexion 5 5  Knee extension 5 5  Ankle dorsiflexion 5 5  Ankle plantarflexion 5 5  Ankle inversion 5 5  Ankle eversion 5 5   (Blank rows = not tested)  LOWER EXTREMITY  SPECIAL TESTS:  NT  FUNCTIONAL TESTS:  2 minute walk test: TBA SLS 8 sec  07/28/23  SLS Left 25 sec , SLS right 60  GAIT: Distance walked: 200' Assistive device utilized: None Level of assistance: Complete Independence Comments: Decreased push off L when barefooted                                                                                                                        TREATMENT DATE:  [facility] Adult PT Treatment:                                                DATE: 09/01/23 Therapeutic Exercise: Ankle DF, Ev, and Inv RTB 3x12 each Therapeutic Activity: SL balance with challenges: opp leg knee lifts, opp leg semi-circles, opp leg abd, add to medial knee, airex Self Care Review of re-assessment findings, healing process and time frames for condition  [facility] Adult PT Treatment:                                                DATE: 08/17/23  Therapeutic Exercise: Rec bike 5 mins L3 Ankle DF, Ev, and Inv RTB 3x12 each Manual Therapy: STM and CFM to the L plantar foot- fascia and tendons  [facility] Adult PT Treatment:                                                DATE: 08/11/23 Manual Therapy: STM to the L gastroc and soleus muscles Skilled palpation to the L calf to  identify Tps and taut muscle bands Trigger Point Dry Needling Initial Treatment: Pt instructed on Dry Needling rational, procedures, and possible side effects. Pt instructed to expect mild to moderate muscle soreness later in the day and/or into the next day.  Pt instructed in methods to reduce muscle soreness. Pt instructed to continue prescribed HEP. Because Dry Needling was performed over or adjacent to a lung field, pt was educated on S/S of pneumothorax and to seek immediate medical attention should they occur.  Patient was educated on signs and symptoms of infection and other risk factors and advised to seek medical attention should they occur.  Patient verbalized understanding of these instructions and education.   Patient Verbal Consent Given: Yes Education Handout Provided: Yes Muscles Treated: L gastroc and soleus Electrical Stimulation Performed: No Treatment Response/Outcome: Twitch responses   Therapeutic Exercise: Slant board stretch gastroc stretch / soleus stretch c towel roll at toes for plantar fascia Seated hamstring and calf stretch c strap Heel raises 2x10 on 2 inch riser Therapeutic Activity: SL  balance on airex Rocker board AP and laterally  [facility] Adult PT Treatment:                                                DATE: 08/04/23 Therapeutic Exercise: Rec bike for 5 mins L3 Slant board stretch gastroc stretch / soleus stretch c towel roll at toes for plantar fascia Seated hamstring and calf stretch c strap Ankle DF, Ev, and Inv RTB 2x12 each Manual Therapy: STM to the L gastroc and soleus muscles with TPR to the lateral gastroc  [facility] Adult PT Treatment:                                                DATE: 07/28/23 Therapeutic Exercise: Supine h/s stretch x 3 each  Slant board stretch gastroc stretch / soleus stretch Slant board board heel raise x 8 Seated Marble pick up x 20 Seated toe extension AROM  Modalities: Ionto patch to plantar fascia 1ml  dexamethasone - 4-6 hour wear time Ice pack to bottom of foot x 5 minutes , hooklying    PATIENT EDUCATION:  Education details: Eval findings, POC, HEP, self care  Person educated: Patient Education method: Explanation, Demonstration, Tactile cues, Verbal cues, and Handouts Education comprehension: verbalized understanding, returned demonstration, verbal cues required, and tactile cues required  HOME EXERCISE PROGRAM: Access Code: [email or web address] URL: [email or web address] Date: 09/01/2023 Prepared by: Joellyn Rued  Exercises - Long Sitting Calf Stretch with Strap  - 1 x daily - 7 x weekly - 1 sets - 5 reps - 20 hold - Gastroc Stretch on Wall  - 1 x daily - 7 x weekly - 1 sets - 3 reps - 30 hold - Seated Plantar Fascia Stretch  - 1 x daily - 7 x weekly - 1 sets - 1-3 reps - 30 hold - Seated Hamstring Stretch with Strap  - 1 x daily - 7 x weekly - 3 sets - 10 reps - Heel Raises with Counter Support  - 1 x daily - 7 x weekly - 3 sets - 10 reps - 3 hold - Single Leg Stance  - 1 x daily - 7 x weekly - 1 sets - 3 reps - 30 hold - Toe Yoga - Alternating Great Toe and Lesser Toe Extension  - 1 x daily - 7 x weekly - 2 sets - 20 reps - Seated Toe Towel Scrunches  - 1 x daily - 7 x weekly - 2 sets - 10 reps - Seated Ankle Eversion with Resistance  - 1 x daily - 7 x weekly - 3 sets - 10 reps - Seated Figure 4 Ankle Inversion with Resistance (Mirrored)  - 1 x daily - 7 x weekly - 3 sets - 10 reps - Seated Ankle Dorsiflexion with Anchored Resistance  - 1 x daily - 7 x weekly - 3 sets - 10 reps - Seated Ankle Dorsiflexion with Resistance (Mirrored)  - 1 x daily - 7 x weekly - 3 sets - 10 reps  ASSESSMENT:  CLINICAL IMPRESSION: Pt completed his course of PT today. Pt has made very good progress re: pain reduction, flexibility and functional mobility. Pt noted having issues with single leg balance, so exercises were completed for  L ankle strengthening and balance. Pt completed with the  exercises with proper technique and they were added to his HEP. Pt is Ind with a HEP and he completes consistently to manage his L plantar foot pain. Pt is inagreement with DC from PT services at this time.  EVAL: Patient is a 63 y.o. male who was seen today for physical therapy evaluation and treatment for M72.2 (ICD-10-CM) - Plantar fasciitis, left . Pt presents with signs and symptoms consistent with the referring Dx. Pt has decreased ankle DF AROM, markedly decreased hamstring flexibility, and is significantly TTP of the arch of the L foot. A HEP for stretching, and strengthening (tissue remodeling) was initiated. Pt will benefit from skilled PT 2w6 to address impairments to optimize L foot function with less pain.   OBJECTIVE IMPAIRMENTS: decreased activity tolerance, difficulty walking, decreased ROM, increased fascial restrictions, obesity, and pain.   ACTIVITY LIMITATIONS: carrying, lifting, standing, stairs, and locomotion level  PARTICIPATION LIMITATIONS: meal prep, cleaning, shopping, community activity, and occupation  PERSONAL FACTORS: Past/current experiences and 1 comorbidity: high BMI  are also affecting patient's functional outcome.   REHAB POTENTIAL: Good  CLINICAL DECISION MAKING: Stable/uncomplicated  EVALUATION COMPLEXITY: Low   GOALS:  LONG TERM GOALS: Target date: 09/01/23  Pt will be Ind in a final HEP to maintain achieved LOF  Baseline: initiated Goal status: MET  2.  Increase L ankle DF AROM c ext knee to 12d or greater to minimize plantar fascia strain Baseline: 7d 09/01/23: 12d Goal status: MET  3.  Increase L hamstring flexibility to 40d or greater to minimize posterior LE muscular strain Baseline: 30d 07/25/23: 35d right, 50 deg  09/01/23: L 45d; R 45d Goal status: MET  4.  Pt will report a 50% or greater improvement in L foot pain for improved function and QOL Baseline: 3/10 ave; 1-8/10 08/11/23: 96% improvement 09/01/23: 95% improvement Goal status:  MET  5.  .Improve by MCID of 44ft as indication of improved functional mobility if pt scores below standard for age Baseline: TBA Goal status: Deferred   PLAN:  PT FREQUENCY: 1x/week  PT DURATION: 1 week  PLANNED INTERVENTIONS: 97164- PT Re-evaluation, 97110-Therapeutic exercises, 97530- Therapeutic activity, [id number]- Neuromuscular re-education, 97535- Self Care, 16109- Manual therapy, [id number]- Gait training, [phone]- Electrical stimulation (unattended), [id number]- Electrical stimulation (manual), [id number]- Ionotophoresis 4mg /ml Dexamethasone, Patient/Family education, Taping, Dry Needling, Joint mobilization, Cryotherapy, and Moist heat  PLAN FOR NEXT SESSION: Assess ; assess response to HEP; progress therex as indicated; use of modalities, manual therapy; and TPDN as indicated.  PHYSICAL THERAPY DISCHARGE SUMMARY  Visits from Start of Care: 9  Current functional level related to goals / functional outcomes: See clinical impression and PT goals    Remaining deficits: See clinical impression and PT goals    Education / Equipment: HEP; Pt Ed   Patient agrees to discharge. Patient goals were met. Patient is being discharged due to meeting the stated rehab goals.   Cortez Steelman MS, PT 09/01/23 6:10 PM  Liborio Reeds MS, PT 09/01/23 6:20 PM

## 2023-09-01 ENCOUNTER — Ambulatory Visit

## 2023-09-01 DIAGNOSIS — M25672 Stiffness of left ankle, not elsewhere classified: Secondary | ICD-10-CM | POA: Diagnosis not present

## 2023-09-01 DIAGNOSIS — R262 Difficulty in walking, not elsewhere classified: Secondary | ICD-10-CM

## 2023-09-01 DIAGNOSIS — M79672 Pain in left foot: Secondary | ICD-10-CM

## 2023-09-01 NOTE — Addendum Note (Signed)
 Addended by: Liborio Reeds on: 09/01/2023 06:20 PM   Modules accepted: Orders

## 2023-09-07 ENCOUNTER — Other Ambulatory Visit (HOSPITAL_COMMUNITY): Payer: Self-pay

## 2023-09-07 ENCOUNTER — Other Ambulatory Visit: Payer: Self-pay

## 2023-09-07 ENCOUNTER — Other Ambulatory Visit: Payer: Self-pay | Admitting: Internal Medicine

## 2023-09-07 DIAGNOSIS — E139 Other specified diabetes mellitus without complications: Secondary | ICD-10-CM

## 2023-09-07 DIAGNOSIS — I10 Essential (primary) hypertension: Secondary | ICD-10-CM

## 2023-09-07 DIAGNOSIS — E1065 Type 1 diabetes mellitus with hyperglycemia: Secondary | ICD-10-CM

## 2023-09-08 ENCOUNTER — Other Ambulatory Visit (HOSPITAL_COMMUNITY): Payer: Self-pay

## 2023-09-11 ENCOUNTER — Encounter: Payer: Self-pay | Admitting: Cardiovascular Disease

## 2023-09-11 NOTE — Progress Notes (Unsigned)
 Cardiology Office Note:    Date:  09/12/2023   ID:  Zachary Graham, DOB Apr 06, 1961, MRN [id number]  PCP:  Arcadio Knuckles, MD   [facility] Providers Cardiologist:  Arieonna Medine   }    Referring MD: Arcadio Knuckles, MD   Chief Complaint  Patient presents with   Palpitations    History of Present Illness:    Zachary Graham is a 63 y.o. male with a hx of pulmonic stenosis, HLD, HTN, DM   Has seen Dr. Audery Blazing in the past  Echo in 2014 showed a peak PV gradient of 16 mmHg. RV was normal  Echo June, 2022: Normal LV systolic function with EV 55-60% Normal RV size and function Peak PV gradient is 14 mmHg.  4-5 months ago, he developed some chest pressure . ? Indigestion The chest pressure is not related to exercise,  does not exercise Not position Not related to eating or drinking   Works 70-80 hrs a week. 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  Lucky if he gets 5 hours of sleep at night   Father had a valve replacement     Aug. 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  He was having lots of fatigue so we cut it in half.  He seems to be tolerating that better.  His heart rate remains high.  Will see if we can go back up to 25 mg a day.  Will have him see an APP in 3 months.  If his overall heart rate seems to be better controlled at that visit later this fall, I would like to place a 3-day event monitor to quantify his PVC burden.   September 12, 2023 Zachary Graham is seen for follow up of his PVCs  3 day event monitor showed frequent  PVCs - PVC burden of 20.8% He was seen by Dr. Marven Graham for consultation Given Zachary Graham's normal EF, Dr. Marven Graham did not recommend any treatment for his PVCs      Past Medical History:  Diagnosis Date   Diabetes mellitus    GERD (gastroesophageal reflux disease)    Heart murmur    Hyperlipidemia    Hypertension    OSA (obstructive sleep apnea)    Pneumonia     Past Surgical History:  Procedure Laterality Date   CERVICAL DISC ARTHROPLASTY N/A 03/16/2017   Procedure: CERVICAL FIVE- CERVICAL SIX DISC ARTHROPLASTY;  Surgeon: Yvonna Herder, MD;  Location: [facility] OR;  Service: Neurosurgery;  Laterality: N/A;  CERVICAL 5- CERVICAL 6 DISC ARTHROPLASTY   COLONOSCOPY     TONSILLECTOMY      Current Medications: Current Meds  Medication Sig   Alpha-Lipoic Acid 600 MG CAPS Take 1 capsule by mouth daily.    Ascorbic Acid (VITAMIN C ) 1000 MG tablet Take 1,000 mg by mouth daily.   aspirin EC 81 MG tablet Take by mouth daily.   atorvastatin  (LIPITOR) 80 MG tablet Take 1 tablet (80 mg  total) by mouth daily.   b complex vitamins tablet Take 1 tablet by mouth daily.   BAYER MICROLET LANCETS lancets USE TO TEST BLOOD SUGAR 6 TIMES A DAY   Calcium -Magnesium -Vitamin D  (CALCIUM  500 PO) Take 1 tablet by mouth daily.    Continuous Glucose Sensor (DEXCOM G7 SENSOR) MISC Apply 1 sensor every 10 days   dapagliflozin  propanediol (FARXIGA ) 5 MG TABS tablet Take 1 tablet (5 mg total) by mouth daily before breakfast.   famotidine (PEPCID) 40 MG tablet Take 1 tablet by mouth at bedtime.   ferrous sulfate  325 (65 FE) MG tablet Take 1 tablet (325 mg total) by mouth daily with breakfast.   glucose blood (CONTOUR NEXT TEST) test strip USE AS INSTRUCTED TO CHECK SUGAR 3 TIMES DAILY   insulin  lispro (HUMALOG ) 100 UNIT/ML injection Inject up to 150 units into insulin  pump daily.   levocetirizine (XYZAL ) 5 MG tablet TAKE 1 TABLET BY MOUTH IN THE EVENING   losartan  (COZAAR ) 50 MG tablet Take 1 tablet (50 mg total) by  mouth daily.   Magnesium  Oxide 400 MG CAPS Take 1 capsule (400 mg total) by mouth daily.   metFORMIN  (GLUCOPHAGE ) 500 MG tablet Take 2 tablets (1,000 mg total) by mouth 2 (two) times daily before a meal.   metoprolol  succinate (TOPROL  XL) 25 MG 24 hr tablet Take 1 tablet (25 mg total) by mouth daily.   NON FORMULARY CPAP   omeprazole (PRILOSEC) 40 MG capsule Take 20 mg by mouth daily.   Semaglutide,0.25 or 0.5MG /DOS, 2 MG/3ML SOPN Inject into the skin once a week.   traZODone  (DESYREL ) 50 MG tablet Take 1 tablet (50 mg total) by mouth at bedtime.   [DISCONTINUED] metoprolol  succinate (TOPROL  XL) 25 MG 24 hr tablet Take 0.5 tablets (12.5 mg total) by mouth daily.     Allergies:   Egg-derived products, Lisinopril, Shellfish allergy, Zetia  [ezetimibe ], Dulaglutide , Gabapentin, Gabapentin (once-daily), Iodine, Penicillins, and Cymbalta  [duloxetine  hcl]   Social History   Socioeconomic History   Marital status: Married    Spouse name: Not on file   Number of children: 3   Years of education: Not on file   Highest education level: Master's degree (e.g., MA, MS, MEng, MEd, MSW, MBA)  Occupational History   Occupation: RADIATION SAFETY OFFICER    Employer: [employer]  Tobacco Use   Smoking status: Never    Passive exposure: Never   Smokeless tobacco: Never  Vaping Use   Vaping status: Never Used  Substance and Sexual Activity   Alcohol use: No    Alcohol/week: 0.0 standard drinks of alcohol   Drug use: No   Sexual activity: Yes    Partners: Female  Other Topics Concern   Not on file  Social History Narrative   Regular exercise: runs 3 miles 3x week, takes one day off per week to rest   Caffeine use: stopped all diet soda on 04/23/15 per advice from Sports MD   Social Drivers of Health   Financial Resource Strain: Low Risk  (08/07/2023)   Overall Financial Resource Strain (CARDIA)    Difficulty of Paying Living Expenses: Not very hard  Food Insecurity: No Food Insecurity  (08/07/2023)   Hunger Vital Sign    Worried About Running Out of Food in the Last Year: Never true    Ran Out of Food in the Last Year: Never true  Transportation Needs: No Transportation Needs (08/07/2023)   PRAPARE - Administrator, Civil Service (Medical): No  Lack of Transportation (Non-Medical): No  Physical Activity: Insufficiently Active (08/07/2023)   Exercise Vital Sign    Days of Exercise per Week: 2 days    Minutes of Exercise per Session: 30 min  Stress: No Stress Concern Present (08/07/2023)   [employer] of Occupational Health - Occupational Stress Questionnaire    Feeling of Stress : Only a little  Social Connections: Socially Integrated (08/07/2023)   Social Connection and Isolation Panel [NHANES]    Frequency of Communication with Friends and Family: Three times a week    Frequency of Social Gatherings with Friends and Family: Twice a week    Attends Religious Services: 1 to 4 times per year    Active Member of [employer] or Organizations: Yes    Attends Engineer, structural: More than 4 times per year    Marital Status: Married     Family History: The patient's family history includes Colon cancer in his maternal grandfather and paternal grandfather; Heart disease in his father. There is no history of Alcohol abuse, Asthma, Diabetes, Hyperlipidemia, Hypertension, Kidney disease, or Stroke.  ROS:   Please see the history of present illness.     All other systems reviewed and are negative.  EKGs/Labs/Other Studies Reviewed:    The following studies were reviewed today:   EKG:           Recent Labs: 06/17/2023: BUN 12; Creatinine, Ser 0.80; Magnesium  1.7; Potassium 4.3; Sodium 141 08/08/2023: ALT 16; Hemoglobin 14.4; Platelets 232.0; TSH 3.30  Recent Lipid Panel    Component Value Date/Time   CHOL 95 10/29/2022 0000   TRIG 99 10/29/2022 0000   HDL 37 10/29/2022 0000   CHOLHDL 2 01/27/2022 1602   VLDL 19.6 01/27/2022 1602   LDLCALC 42  10/29/2022 0000   LDLDIRECT 87.0 12/02/2016 1523     Risk Assessment/Calculations:              Physical Exam:     Physical Exam: Blood pressure 102/62, pulse 70, height 5' 9.5" (1.765 m), weight 219 lb (99.3 kg), SpO2 95%.       GEN:  Well nourished, well developed in no acute distress HEENT: Normal NECK: No JVD; No carotid bruits LYMPHATICS: No lymphadenopathy CARDIAC: RRR , no murmurs, rubs, gallops RESPIRATORY:  Clear to auscultation without rales, wheezing or rhonchi  ABDOMEN: Soft, non-tender, non-distended MUSCULOSKELETAL:  No edema; No deformity  SKIN: Warm and dry NEUROLOGIC:  Alert and oriented x 3     ASSESSMENT:    1. Frequent PVCs      PLAN:      Pulmonary stenosis:    2.  Chest pain:    3.  PVCs : He has a fair amount of premature ventricular contractions.     He has been seen by electrophysiology.  Will try increasing the metoprolol  XL to 25 mg a day to see if that helps.  He will return to see us  in 1 year.              Medication Adjustments/Labs and Tests Ordered: Current medicines are reviewed at length with the patient today.  Concerns regarding medicines are outlined above.  No orders of the defined types were placed in this encounter.  Meds ordered this encounter  Medications   metoprolol  succinate (TOPROL  XL) 25 MG 24 hr tablet    Sig: Take 1 tablet (25 mg total) by mouth daily.    Dispense:  90 tablet    Refill:  3  Patient Instructions  Medication Instructions:  INCREASE Metoprolol  XL to 25 mg daily  *If you need a refill on your cardiac medications before your next appointment, please call your pharmacy*  Follow-Up: At [facility], you and your health needs are our priority.  As part of our continuing mission to provide you with exceptional heart care, our providers are all part of one team.  This team includes your primary Cardiologist (physician) and Advanced Practice Providers or APPs (Physician  Assistants and Nurse Practitioners) who all work together to provide you with the care you need, when you need it.  Your next appointment:   1 year(s)  Provider:   Ahmad Alert, MD    Signed, Ahmad Alert, MD  09/12/2023 5:07 PM    [facility]

## 2023-09-12 ENCOUNTER — Ambulatory Visit: Payer: 59 | Attending: Cardiovascular Disease | Admitting: Cardiovascular Disease

## 2023-09-12 ENCOUNTER — Encounter: Payer: Self-pay | Admitting: Cardiovascular Disease

## 2023-09-12 ENCOUNTER — Other Ambulatory Visit (HOSPITAL_COMMUNITY): Payer: Self-pay

## 2023-09-12 VITALS — BP 102/62 | HR 70 | Ht 69.5 in | Wt 219.0 lb

## 2023-09-12 DIAGNOSIS — I493 Ventricular premature depolarization: Secondary | ICD-10-CM | POA: Diagnosis not present

## 2023-09-12 MED ORDER — METOPROLOL SUCCINATE ER 25 MG PO TB24
25.0000 mg | ORAL_TABLET | Freq: Every day | ORAL | 3 refills | Status: AC
  Filled 2023-09-12 – 2024-01-17 (×4): qty 90, 90d supply, fill #0
  Filled 2024-05-07: qty 90, 90d supply, fill #1

## 2023-09-12 NOTE — Patient Instructions (Signed)
 Medication Instructions:  INCREASE Metoprolol  XL to 25 mg daily  *If you need a refill on your cardiac medications before your next appointment, please call your pharmacy*  Follow-Up: At [facility], you and your health needs are our priority.  As part of our continuing mission to provide you with exceptional heart care, our providers are all part of one team.  This team includes your primary Cardiologist (physician) and Advanced Practice Providers or APPs (Physician Assistants and Nurse Practitioners) who all work together to provide you with the care you need, when you need it.  Your next appointment:   1 year(s)  Provider:   Ahmad Alert, MD

## 2023-09-13 ENCOUNTER — Other Ambulatory Visit (HOSPITAL_COMMUNITY): Payer: Self-pay

## 2023-09-14 ENCOUNTER — Encounter: Payer: Self-pay | Admitting: Family Medicine

## 2023-09-16 ENCOUNTER — Other Ambulatory Visit: Payer: Self-pay | Admitting: Internal Medicine

## 2023-09-16 ENCOUNTER — Other Ambulatory Visit (HOSPITAL_COMMUNITY): Payer: Self-pay

## 2023-09-16 DIAGNOSIS — E139 Other specified diabetes mellitus without complications: Secondary | ICD-10-CM

## 2023-09-16 DIAGNOSIS — I10 Essential (primary) hypertension: Secondary | ICD-10-CM

## 2023-09-16 DIAGNOSIS — E1065 Type 1 diabetes mellitus with hyperglycemia: Secondary | ICD-10-CM

## 2023-09-19 ENCOUNTER — Other Ambulatory Visit (HOSPITAL_COMMUNITY): Payer: Self-pay

## 2023-09-19 NOTE — Progress Notes (Unsigned)
 [facility] [address] [place] [address] Phone: [phone] Subjective:   Zachary Graham, am serving as a scribe for Dr. Ronnell Coins.  I'm seeing this patient by the request  of:  Arcadio Knuckles, MD  CC: Low back pain follow-up  [id number]  Zachary Graham is a 63 y.o. male coming in with complaint of back and neck pain. OMT 08/03/2023. Patient states that he has been doing well since last visit. Foot and back have improved. Stretching on slant board has helped his foot pain.   Medications patient has been prescribed: None         Reviewed prior external information including notes and imaging from previsou exam, outside providers and external EMR if available.   As well as notes that were available from care everywhere and other healthcare systems.  Past medical history, social, surgical and family history all reviewed in electronic medical record.  No pertanent information unless stated regarding to the chief complaint.   Past Medical History:  Diagnosis Date   Diabetes mellitus    GERD (gastroesophageal reflux disease)    Heart murmur    Hyperlipidemia    Hypertension    OSA (obstructive sleep apnea)    Pneumonia     Allergies  Allergen Reactions   Egg-Derived Products Nausea And Vomiting   Lisinopril Swelling and Other (See Comments)    Extreme facial swelling causing hospitalization   Shellfish Allergy Anaphylaxis   Zetia  [Ezetimibe ] Other (See Comments)    ABDOMINAL CRAMPING   Dulaglutide  Other (See Comments)   Gabapentin     Other reaction(s): Dizziness   Gabapentin (Once-Daily) Other (See Comments)   Iodine    Penicillins Other (See Comments)    UNSPECIFIED REACTION > Childhood allergy     Cymbalta  [Duloxetine  Hcl] Other (See Comments)    Severe feet cramps     Review of Systems:  No headache, visual changes, nausea, vomiting, diarrhea, constipation, dizziness, abdominal pain, skin rash, fevers, chills,  night sweats, weight loss, swollen lymph nodes, body aches, joint swelling, chest pain, shortness of breath, mood changes. POSITIVE muscle aches  Objective  Blood pressure 118/78, height 5' 9.5" (1.765 m), weight 219 lb (99.3 kg).   General: No apparent distress alert and oriented x3 mood and affect normal, dressed appropriately.  HEENT: Pupils equal, extraocular movements intact  Respiratory: Patient's speak in full sentences and does not appear short of breath  Cardiovascular: No lower extremity edema, non tender, no erythema  Gait MSK:  Back patient does have some external rotation of the legs bilaterally.  Patient does have some tightness noted in the paraspinal musculature of the back.  Negative straight leg test noted today.  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  At this moment still doing relatively well with the strengthening as well as the stretching.  Patient will follow-up with me again in 8 weeks.  If worsening pain can can always consider injection  Low back pain Chronic prominence exacerbated secondary to muscle imbalances as well as stress level symptoms.  Discussed continuing to monitor for other things.  Watching for any radicular symptoms.  Watching for any weakness that is out of the ordinary.  Increase activity slowly otherwise.  Follow-up again in 6 to 8 weeks.    Nonallopathic problems  Decision today to treat with OMT was based on Physical Exam  After verbal consent patient was treated with HVLA, ME, FPR techniques in  rib, thoracic, lumbar, and sacral  areas  Patient tolerated the procedure well with improvement in symptoms  Patient given exercises, stretches and lifestyle modifications  See medications in patient instructions if  given  Patient will follow up in 4-8 weeks    The above documentation has been reviewed and is accurate and complete Nataliee Shurtz M Emmie Frakes, DO          Note: This dictation was prepared with Dragon dictation along with smaller phrase technology. Any transcriptional errors that result from this process are unintentional.

## 2023-09-21 ENCOUNTER — Encounter: Payer: Self-pay | Admitting: Family Medicine

## 2023-09-21 ENCOUNTER — Ambulatory Visit: Admitting: Family Medicine

## 2023-09-21 VITALS — BP 118/78 | Ht 69.5 in | Wt 219.0 lb

## 2023-09-21 DIAGNOSIS — M9902 Segmental and somatic dysfunction of thoracic region: Secondary | ICD-10-CM | POA: Diagnosis not present

## 2023-09-21 DIAGNOSIS — G8929 Other chronic pain: Secondary | ICD-10-CM | POA: Diagnosis not present

## 2023-09-21 DIAGNOSIS — M9908 Segmental and somatic dysfunction of rib cage: Secondary | ICD-10-CM

## 2023-09-21 DIAGNOSIS — M545 Low back pain, unspecified: Secondary | ICD-10-CM | POA: Diagnosis not present

## 2023-09-21 DIAGNOSIS — M9904 Segmental and somatic dysfunction of sacral region: Secondary | ICD-10-CM

## 2023-09-21 DIAGNOSIS — M9903 Segmental and somatic dysfunction of lumbar region: Secondary | ICD-10-CM

## 2023-09-21 DIAGNOSIS — D2122 Benign neoplasm of connective and other soft tissue of left lower limb, including hip: Secondary | ICD-10-CM | POA: Diagnosis not present

## 2023-09-21 NOTE — Assessment & Plan Note (Signed)
 Chronic prominence exacerbated secondary to muscle imbalances as well as stress level symptoms.  Discussed continuing to monitor for other things.  Watching for any radicular symptoms.  Watching for any weakness that is out of the ordinary.  Increase activity slowly otherwise.  Follow-up again in 6 to 8 weeks.

## 2023-09-21 NOTE — Patient Instructions (Signed)
 Doing great See me again in 2 months

## 2023-09-21 NOTE — Assessment & Plan Note (Signed)
 MRI does show the fibroma in the foot.  At this moment still doing relatively well with the strengthening as well as the stretching.  Patient will follow-up with me again in 8 weeks.  If worsening pain can can always consider injection

## 2023-09-22 ENCOUNTER — Other Ambulatory Visit: Payer: Self-pay | Admitting: Internal Medicine

## 2023-09-22 ENCOUNTER — Other Ambulatory Visit (HOSPITAL_COMMUNITY): Payer: Self-pay

## 2023-09-22 DIAGNOSIS — I10 Essential (primary) hypertension: Secondary | ICD-10-CM

## 2023-09-22 DIAGNOSIS — E139 Other specified diabetes mellitus without complications: Secondary | ICD-10-CM

## 2023-09-22 DIAGNOSIS — E1065 Type 1 diabetes mellitus with hyperglycemia: Secondary | ICD-10-CM

## 2023-09-23 ENCOUNTER — Other Ambulatory Visit (HOSPITAL_COMMUNITY): Payer: Self-pay

## 2023-09-24 ENCOUNTER — Other Ambulatory Visit (HOSPITAL_COMMUNITY): Payer: Self-pay

## 2023-09-26 ENCOUNTER — Telehealth: Payer: Self-pay

## 2023-09-26 ENCOUNTER — Other Ambulatory Visit (HOSPITAL_COMMUNITY): Payer: Self-pay

## 2023-09-26 MED ORDER — LOSARTAN POTASSIUM 50 MG PO TABS
50.0000 mg | ORAL_TABLET | Freq: Every day | ORAL | 1 refills | Status: DC
Start: 2023-09-26 — End: 2024-01-11
  Filled 2023-09-26: qty 90, 90d supply, fill #0
  Filled 2023-12-19: qty 90, 90d supply, fill #1

## 2023-09-26 NOTE — Telephone Encounter (Signed)
 Copied from CRM [id number]. Topic: Clinical - Prescription Issue >> Sep 23, 2023  3:37 PM Clydene Darner H wrote: Reason for CRM: Gareld June from [facility] called to follow up on Cozaar . She stated she's been faxing over the request several times and haven't heard anything back. Requesting a callback for clarification.  Callback Number: [phone]

## 2023-09-27 ENCOUNTER — Other Ambulatory Visit (HOSPITAL_COMMUNITY): Payer: Self-pay

## 2023-09-27 NOTE — Telephone Encounter (Signed)
 Medication has been refilled.

## 2023-09-28 ENCOUNTER — Other Ambulatory Visit: Payer: Self-pay

## 2023-09-28 ENCOUNTER — Other Ambulatory Visit: Payer: Self-pay | Admitting: Internal Medicine

## 2023-09-28 ENCOUNTER — Other Ambulatory Visit (HOSPITAL_COMMUNITY): Payer: Self-pay

## 2023-09-28 DIAGNOSIS — E785 Hyperlipidemia, unspecified: Secondary | ICD-10-CM

## 2023-09-28 MED ORDER — ATORVASTATIN CALCIUM 80 MG PO TABS
80.0000 mg | ORAL_TABLET | Freq: Every day | ORAL | 1 refills | Status: DC
  Filled 2023-09-28: qty 90, 90d supply, fill #0
  Filled 2023-12-24: qty 90, 90d supply, fill #1

## 2023-09-29 ENCOUNTER — Other Ambulatory Visit: Payer: Self-pay

## 2023-10-03 ENCOUNTER — Other Ambulatory Visit (HOSPITAL_COMMUNITY): Payer: Self-pay

## 2023-11-16 NOTE — Progress Notes (Signed)
 [facility] [address] [place] [address] Phone: [phone] Subjective:   Zachary Graham, am serving as a scribe for Dr. Arthea Claudene.  I'm seeing this patient by the request  of:  [name], MD  CC:  back pain  [id number]  Zachary Graham is a 63 y.o. male coming in with complaint of back pain. OMT on 09/21/2023. Patient states that he has been doing well. Has had some neck tightness and he took Advil  which was helpful.   Slight twinge of pain in patellar tendon and medial joint line of L knee. Pain is manageable and happens only intermittently.   Medications patient has been prescribed:   Taking:         Reviewed prior external information including notes and imaging from previsou exam, outside providers and external EMR if available.   As well as notes that were available from care everywhere and other healthcare systems.  Past medical history, social, surgical and family history all reviewed in electronic medical record.  No pertanent information unless stated regarding to the chief complaint.   Past Medical History:  Diagnosis Date   Diabetes mellitus    GERD (gastroesophageal reflux disease)    Heart murmur    Hyperlipidemia    Hypertension    OSA (obstructive sleep apnea)    Pneumonia     Allergies  Allergen Reactions   Egg-Derived Products Nausea And Vomiting   Lisinopril Swelling and Other (See Comments)    Extreme facial swelling causing hospitalization   Shellfish Allergy Anaphylaxis   Zetia  [Ezetimibe ] Other (See Comments)    ABDOMINAL CRAMPING   Dulaglutide  Other (See Comments)   Gabapentin     Other reaction(s): Dizziness   Gabapentin (Once-Daily) Other (See Comments)   Iodine    Penicillins Other (See Comments)    UNSPECIFIED REACTION > Childhood allergy     Cymbalta  [Duloxetine  Hcl] Other (See Comments)    Severe feet cramps     Review of Systems:  No headache, visual changes, nausea,  vomiting, diarrhea, constipation, dizziness, abdominal pain, skin rash, fevers, chills, night sweats, weight loss, swollen lymph nodes, body aches, joint swelling, chest pain, shortness of breath, mood changes. POSITIVE muscle aches  Objective  Blood pressure 118/66, pulse (!) 59, height 5' 9.5 (1.765 m), weight 222 lb (100.7 kg), SpO2 98%.   General: No apparent distress alert and oriented x3 mood and affect normal, dressed appropriately.  HEENT: Pupils equal, extraocular movements intact  Respiratory: Patient's speak in full sentences and does not appear short of breath  Cardiovascular: No lower extremity edema, non tender, no erythema  Gait MSK:  Back tightness in the back but overall not anything significant.  Patient still has some weakness of the hip abductors.  Some increasing external range of motion noted  Osteopathic findings  T6 extended rotated and side bent right inhaled rib T9 extended rotated and side bent left L2 flexed rotated and side bent right L5 flexed rotated and side bent left Sacrum right on right     Assessment and Plan:  Low back pain Low back does have some loss of lordosis noted.  Some tenderness to palpation in the paraspinal musculature.  Negative straight leg test.  No weakness of the hip abductor still noted.  Continue to work on core strengthening.  Follow-up again in 6 to 8 weeks    Nonallopathic problems  Decision today to treat with OMT was based on Physical Exam  After  verbal consent patient was treated with HVLA, ME, FPR techniques in rib, thoracic, lumbar, and sacral  areas  Patient tolerated the procedure well with improvement in symptoms  Patient given exercises, stretches and lifestyle modifications  See medications in patient instructions if given  Patient will follow up in 4-8 weeks    The above documentation has been reviewed and is accurate and complete Arthea CHRISTELLA Sharps, DO          Note: This dictation was prepared  with Dragon dictation along with smaller phrase technology. Any transcriptional errors that result from this process are unintentional.

## 2023-11-21 ENCOUNTER — Encounter: Payer: Self-pay | Admitting: Family Medicine

## 2023-11-21 ENCOUNTER — Ambulatory Visit (INDEPENDENT_AMBULATORY_CARE_PROVIDER_SITE_OTHER): Admitting: Family Medicine

## 2023-11-21 ENCOUNTER — Other Ambulatory Visit (HOSPITAL_COMMUNITY): Payer: Self-pay

## 2023-11-21 VITALS — BP 118/66 | HR 59 | Ht 69.5 in | Wt 222.0 lb

## 2023-11-21 DIAGNOSIS — M9904 Segmental and somatic dysfunction of sacral region: Secondary | ICD-10-CM | POA: Diagnosis not present

## 2023-11-21 DIAGNOSIS — M9902 Segmental and somatic dysfunction of thoracic region: Secondary | ICD-10-CM | POA: Diagnosis not present

## 2023-11-21 DIAGNOSIS — M9903 Segmental and somatic dysfunction of lumbar region: Secondary | ICD-10-CM

## 2023-11-21 DIAGNOSIS — G8929 Other chronic pain: Secondary | ICD-10-CM

## 2023-11-21 DIAGNOSIS — M545 Low back pain, unspecified: Secondary | ICD-10-CM

## 2023-11-21 NOTE — Assessment & Plan Note (Signed)
 Low back does have some loss of lordosis noted.  Some tenderness to palpation in the paraspinal musculature.  Negative straight leg test.  No weakness of the hip abductor still noted.  Continue to work on core strengthening.  Follow-up again in 6 to 8 weeks

## 2023-12-19 ENCOUNTER — Other Ambulatory Visit (HOSPITAL_COMMUNITY): Payer: Self-pay

## 2023-12-19 ENCOUNTER — Other Ambulatory Visit: Payer: Self-pay | Admitting: Internal Medicine

## 2023-12-20 ENCOUNTER — Other Ambulatory Visit (HOSPITAL_COMMUNITY): Payer: Self-pay

## 2023-12-20 MED ORDER — CONTOUR NEXT TEST VI STRP
ORAL_STRIP | 5 refills | Status: AC
  Filled 2023-12-20 – 2024-01-02 (×2): qty 300, 90d supply, fill #0
  Filled 2024-01-17: qty 250, 83d supply, fill #0
  Filled 2024-01-18: qty 250, 84d supply, fill #0
  Filled 2024-02-14: qty 250, 84d supply, fill #1

## 2023-12-23 ENCOUNTER — Other Ambulatory Visit (HOSPITAL_COMMUNITY): Payer: Self-pay

## 2023-12-26 ENCOUNTER — Other Ambulatory Visit (HOSPITAL_BASED_OUTPATIENT_CLINIC_OR_DEPARTMENT_OTHER): Payer: Self-pay

## 2023-12-26 ENCOUNTER — Other Ambulatory Visit (HOSPITAL_COMMUNITY): Payer: Self-pay

## 2023-12-27 ENCOUNTER — Other Ambulatory Visit (HOSPITAL_COMMUNITY): Payer: Self-pay

## 2023-12-30 ENCOUNTER — Other Ambulatory Visit (HOSPITAL_COMMUNITY): Payer: Self-pay

## 2023-12-30 ENCOUNTER — Telehealth (HOSPITAL_COMMUNITY): Payer: Self-pay

## 2024-01-02 ENCOUNTER — Other Ambulatory Visit (HOSPITAL_COMMUNITY): Payer: Self-pay

## 2024-01-03 ENCOUNTER — Telehealth: Payer: Self-pay | Admitting: Pharmacy Technician

## 2024-01-03 ENCOUNTER — Other Ambulatory Visit (HOSPITAL_COMMUNITY): Payer: Self-pay

## 2024-01-03 NOTE — Telephone Encounter (Signed)
 Pharmacy Patient Advocate Encounter   Received notification from Pt Calls Messages/Pharmacy that prior authorization for Contour Next Test Strips is required/requested.   Insurance verification completed.   The patient is insured through [facility] .   Per test claim:  Freestyle, Precisn, or OneTough is preferred by the insurance.  If suggested medication is appropriate, Please send in a new RX and discontinue this one. If not, please advise as to why it's not appropriate so that we may request a Prior Authorization. Please note, some preferred medications may still require a PA.  If the suggested medications have not been trialed and there are no contraindications to their use, the PA will not be submitted, as it will not be approved. Pt can get a free meter if needed.

## 2024-01-03 NOTE — Telephone Encounter (Signed)
 PA request has been Received. New Encounter has been or will be created for follow up. For additional info see Pharmacy Prior Auth telephone encounter from 01/03/24.

## 2024-01-04 ENCOUNTER — Other Ambulatory Visit (HOSPITAL_COMMUNITY): Payer: Self-pay

## 2024-01-04 ENCOUNTER — Encounter: Payer: Self-pay | Admitting: Internal Medicine

## 2024-01-04 ENCOUNTER — Encounter (HOSPITAL_COMMUNITY): Payer: Self-pay

## 2024-01-11 ENCOUNTER — Other Ambulatory Visit (HOSPITAL_COMMUNITY): Payer: Self-pay

## 2024-01-11 ENCOUNTER — Ambulatory Visit: Payer: Self-pay | Admitting: Internal Medicine

## 2024-01-11 ENCOUNTER — Ambulatory Visit (INDEPENDENT_AMBULATORY_CARE_PROVIDER_SITE_OTHER)

## 2024-01-11 ENCOUNTER — Encounter: Payer: Self-pay | Admitting: Internal Medicine

## 2024-01-11 ENCOUNTER — Ambulatory Visit (INDEPENDENT_AMBULATORY_CARE_PROVIDER_SITE_OTHER): Admitting: Internal Medicine

## 2024-01-11 VITALS — BP 134/70 | HR 74 | Temp 98.3°F | Resp 16 | Ht 69.5 in | Wt 233.0 lb

## 2024-01-11 DIAGNOSIS — M546 Pain in thoracic spine: Secondary | ICD-10-CM

## 2024-01-11 DIAGNOSIS — Z125 Encounter for screening for malignant neoplasm of prostate: Secondary | ICD-10-CM

## 2024-01-11 DIAGNOSIS — E139 Other specified diabetes mellitus without complications: Secondary | ICD-10-CM | POA: Insufficient documentation

## 2024-01-11 DIAGNOSIS — Z0000 Encounter for general adult medical examination without abnormal findings: Secondary | ICD-10-CM | POA: Diagnosis not present

## 2024-01-11 DIAGNOSIS — Z0001 Encounter for general adult medical examination with abnormal findings: Secondary | ICD-10-CM | POA: Insufficient documentation

## 2024-01-11 DIAGNOSIS — E1065 Type 1 diabetes mellitus with hyperglycemia: Secondary | ICD-10-CM

## 2024-01-11 DIAGNOSIS — F320 Major depressive disorder, single episode, mild: Secondary | ICD-10-CM | POA: Diagnosis not present

## 2024-01-11 DIAGNOSIS — E781 Pure hyperglyceridemia: Secondary | ICD-10-CM

## 2024-01-11 DIAGNOSIS — J301 Allergic rhinitis due to pollen: Secondary | ICD-10-CM | POA: Diagnosis not present

## 2024-01-11 DIAGNOSIS — R0789 Other chest pain: Secondary | ICD-10-CM | POA: Insufficient documentation

## 2024-01-11 DIAGNOSIS — I10 Essential (primary) hypertension: Secondary | ICD-10-CM

## 2024-01-11 DIAGNOSIS — E785 Hyperlipidemia, unspecified: Secondary | ICD-10-CM | POA: Diagnosis not present

## 2024-01-11 LAB — CBC WITH DIFFERENTIAL/PLATELET
Basophils Absolute: 0 K/uL (ref 0.0–0.1)
Basophils Relative: 0.4 % (ref 0.0–3.0)
Eosinophils Absolute: 0.6 K/uL (ref 0.0–0.7)
Eosinophils Relative: 6.6 % — ABNORMAL HIGH (ref 0.0–5.0)
HCT: 41.6 % (ref 39.0–52.0)
Hemoglobin: 13.8 g/dL (ref 13.0–17.0)
Lymphocytes Relative: 24.4 % (ref 12.0–46.0)
Lymphs Abs: 2.1 K/uL (ref 0.7–4.0)
MCHC: 33 g/dL (ref 30.0–36.0)
MCV: 86.9 fl (ref 78.0–100.0)
Monocytes Absolute: 0.6 K/uL (ref 0.1–1.0)
Monocytes Relative: 7.2 % (ref 3.0–12.0)
Neutro Abs: 5.3 K/uL (ref 1.4–7.7)
Neutrophils Relative %: 61.4 % (ref 43.0–77.0)
Platelets: 229 K/uL (ref 150.0–400.0)
RBC: 4.79 Mil/uL (ref 4.22–5.81)
RDW: 14.5 % (ref 11.5–15.5)
WBC: 8.6 K/uL (ref 4.0–10.5)

## 2024-01-11 LAB — HEMOGLOBIN A1C: Hgb A1c MFr Bld: 8.4 % — ABNORMAL HIGH (ref 4.6–6.5)

## 2024-01-11 MED ORDER — TRAZODONE HCL 50 MG PO TABS
50.0000 mg | ORAL_TABLET | Freq: Every day | ORAL | 1 refills | Status: AC
  Filled 2024-01-11 – 2024-03-14 (×3): qty 90, 90d supply, fill #0
  Filled 2024-03-26 – 2024-06-13 (×2): qty 90, 90d supply, fill #1

## 2024-01-11 MED ORDER — LEVOCETIRIZINE DIHYDROCHLORIDE 5 MG PO TABS
5.0000 mg | ORAL_TABLET | Freq: Every evening | ORAL | 1 refills | Status: AC
  Filled 2024-01-11: qty 90, fill #0
  Filled 2024-02-18: qty 90, 90d supply, fill #0

## 2024-01-11 MED ORDER — ATORVASTATIN CALCIUM 80 MG PO TABS
80.0000 mg | ORAL_TABLET | Freq: Every day | ORAL | 1 refills | Status: AC
  Filled 2024-01-11 – 2024-03-23 (×3): qty 90, 90d supply, fill #0
  Filled 2024-06-13: qty 90, 90d supply, fill #1

## 2024-01-11 MED ORDER — LOSARTAN POTASSIUM 50 MG PO TABS
50.0000 mg | ORAL_TABLET | Freq: Every day | ORAL | 1 refills | Status: AC
  Filled 2024-01-11 – 2024-03-17 (×3): qty 90, 90d supply, fill #0
  Filled 2024-06-15: qty 90, 90d supply, fill #1

## 2024-01-11 NOTE — Progress Notes (Signed)
 Subjective:  Patient ID: Zachary Graham, male    DOB: 06/04/1960  Age: 63 y.o. 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  History of Present Illness Zachary Graham is a 63 year old male who presents with mid back pain after lifting a heavy object.  He has been experiencing mid back pain for the past couple of days after lifting a heavy object, during which the weight shifted and he attempted to catch it. The pain is described as sore to the touch and does not radiate to his arms or legs, remaining localized in the mid back area. It is noticeable when turning, putting on a shirt, and walking.  He has been taking two Advil  a day, which alleviates the pain by about thirty percent, but the pain persists. No fever, chills, night sweats, chest pain, shortness of breath, abdominal pain, nausea, vomiting, or trouble swallowing.  He previously used Ozempic for blood sugar stabilization but discontinued it due to developing an intolerance to eggs and experiencing persistent nausea. Initially, Ozempic caused constipation and diarrhea, but these symptoms resolved. He is currently on a 25 mg dose of metoprolol  for PVCs.  No symptoms related to blood sugar such as excessive thirst or urination. His last eye exam was in January at the [facility], with no retinopathy noted, and he is scheduled for another exam in January.     Outpatient Medications Prior to Visit  Medication Sig Dispense Refill   Alpha-Lipoic Acid 600 MG CAPS Take 1 capsule by mouth daily.      Ascorbic Acid (VITAMIN C ) 1000 MG tablet Take 1,000 mg by mouth daily.     aspirin EC 81 MG tablet Take by mouth daily.     azelastine  (ASTELIN ) 0.1 % nasal spray Place 2 sprays into both nostrils 2 (two) times daily. Use in each nostril as  directed 90 mL 1   b complex vitamins tablet Take 1 tablet by mouth daily.     BAYER MICROLET LANCETS lancets USE TO TEST BLOOD SUGAR 6 TIMES A DAY 300 each 11   Beclomethasone Dipropionate  (QNASL ) 80 MCG/ACT AERS Place 2 puffs into the nose daily as needed (allergies). 10.6 g 5   Calcium -Magnesium -Vitamin D  (CALCIUM  500 PO) Take 1 tablet by mouth daily.      clotrimazole -betamethasone  (LOTRISONE ) cream Apply to affected areas 2 (two) times daily. STOP USING AFTER 2 WEEKS 45 g 1   Continuous Glucose Sensor (DEXCOM G7 SENSOR) MISC Apply 1 sensor every 10 days 9 each 3   cyclobenzaprine  (FLEXERIL ) 10 MG tablet Take 1 tablet (10 mg total) by mouth 3 (three) times daily as needed for muscle spasms. 30 tablet 0   dapagliflozin  propanediol (FARXIGA ) 5 MG TABS tablet Take 1 tablet (5 mg total) by mouth daily before breakfast. 90 tablet 3   EPINEPHrine  0.3 mg/0.3 mL IJ SOAJ injection Inject 0.3 mg into the muscle as needed for anaphylaxis. 1 each PRN   famotidine (PEPCID) 40 MG tablet Take 1 tablet by mouth at bedtime.     ferrous sulfate  325 (65 FE) MG tablet Take 1 tablet (325 mg total) by mouth daily with breakfast. 90 tablet 0   Glucagon  3 MG/DOSE POWD Place 3 mg into the nose once as needed for up to 1 dose. 1  each 11   glucose blood (CONTOUR NEXT TEST) test strip USE AS INSTRUCTED TO CHECK SUGAR 3 TIMES DAILY 300 strip 5   GVOKE HYPOPEN  1-PACK 1 MG/0.2ML SOAJ Inject under skin 0.2 mL as needed for hypoglycemia 0.2 mL PRN   hydrOXYzine  (ATARAX ) 10 MG tablet Take 1 tablet by mouth at bedtime as needed. 90 tablet 0   insulin  glargine-yfgn (SEMGLEE) 100 UNIT/ML injection Inject into the skin.     insulin  lispro (HUMALOG ) 100 UNIT/ML injection Inject up to 150 units into insulin  pump daily. 50 mL 3   ipratropium (ATROVENT) 0.06 % nasal spray Place 2 sprays into the nose as needed for rhinitis.     Magnesium  Oxide 400 MG CAPS Take 1 capsule (400 mg total) by mouth daily. 90 capsule 1   metFORMIN   (GLUCOPHAGE ) 500 MG tablet Take 2 tablets (1,000 mg total) by mouth 2 (two) times daily before a meal. 360 tablet 3   metoprolol  succinate (TOPROL  XL) 25 MG 24 hr tablet Take 1 tablet (25 mg total) by mouth daily. 90 tablet 3   NON FORMULARY CPAP     omeprazole (PRILOSEC) 40 MG capsule Take 20 mg by mouth daily.     Semaglutide,0.25 or 0.5MG /DOS, 2 MG/3ML SOPN Inject into the skin once a week.     atorvastatin  (LIPITOR) 80 MG tablet Take 1 tablet (80 mg total) by mouth daily. 90 tablet 1   levocetirizine (XYZAL ) 5 MG tablet TAKE 1 TABLET BY MOUTH IN THE EVENING 90 tablet 1   losartan  (COZAAR ) 50 MG tablet Take 1 tablet (50 mg total) by mouth daily. 90 tablet 1   traZODone  (DESYREL ) 50 MG tablet Take 1 tablet (50 mg total) by mouth at bedtime. 90 tablet 1   No facility-administered medications prior to visit.    ROS Review of Systems  Constitutional: Negative.  Negative for appetite change, chills, diaphoresis, fatigue and fever.  HENT: Negative.    Eyes: Negative.   Respiratory: Negative.  Negative for cough, chest tightness, shortness of breath and wheezing.   Cardiovascular:  Positive for leg swelling. Negative for chest pain and palpitations.  Gastrointestinal:  Negative for abdominal pain, constipation, diarrhea, nausea and vomiting.  Endocrine: Negative.   Genitourinary:  Negative for difficulty urinating, dysuria and hematuria.  Musculoskeletal:  Positive for back pain. Negative for arthralgias, gait problem, joint swelling and myalgias.  Skin: Negative.  Negative for color change.  Neurological:  Negative for dizziness, weakness and light-headedness.  Hematological:  Negative for adenopathy. Does not bruise/bleed easily.  Psychiatric/Behavioral:  Positive for dysphoric mood. Negative for agitation, confusion, decreased concentration, sleep disturbance and suicidal ideas. The patient is nervous/anxious. The patient is not hyperactive.     Objective:  BP 134/70 (BP Location: Left  Arm, Patient Position: Sitting)   Pulse 74   Temp 98.3 F (36.8 C) (Oral)   Resp 16   Ht 5' 9.5 (1.765 m)   Wt 233 lb (105.7 kg)   SpO2 98%   BMI 33.91 kg/m   BP Readings from Last 3 Encounters:  01/11/24 134/70  11/21/23 118/66  09/21/23 118/78    Wt Readings from Last 3 Encounters:  01/11/24 233 lb (105.7 kg)  11/21/23 222 lb (100.7 kg)  09/21/23 219 lb (99.3 kg)    Physical Exam Vitals reviewed.  Constitutional:      Appearance: Normal appearance. He is not ill-appearing.  HENT:     Mouth/Throat:     Mouth: Mucous membranes are moist.  Eyes:  General: No scleral icterus.    Conjunctiva/sclera: Conjunctivae normal.  Cardiovascular:     Rate and Rhythm: Normal rate and regular rhythm.     Heart sounds: Murmur heard.     Systolic murmur is present with a grade of 2/6.     No diastolic murmur is present.     No friction rub. No gallop.  Pulmonary:     Effort: Pulmonary effort is normal.     Breath sounds: No stridor. No wheezing, rhonchi or rales.  Abdominal:     General: Abdomen is flat.     Palpations: There is no mass.     Tenderness: There is no abdominal tenderness. There is no guarding.     Hernia: No hernia is present.  Musculoskeletal:        General: No swelling.       Arms:     Cervical back: Neck supple.     Right lower leg: 1+ Pitting Edema present.     Left lower leg: 1+ Pitting Edema present.  Lymphadenopathy:     Cervical: No cervical adenopathy.  Skin:    General: Skin is warm and dry.     Findings: No rash.  Neurological:     General: No focal deficit present.     Mental Status: He is alert. Mental status is at baseline.  Psychiatric:        Mood and Affect: Mood normal.        Behavior: Behavior normal.     Lab Results  Component Value Date   WBC 8.6 01/11/2024   HGB 13.8 01/11/2024   HCT 41.6 01/11/2024   PLT 229.0 01/11/2024   GLUCOSE 183 (H) 01/11/2024   CHOL 108 01/11/2024   TRIG 136.0 01/11/2024   HDL 42.60  01/11/2024   LDLDIRECT 87.0 12/02/2016   LDLCALC 38 01/11/2024   ALT 16 08/08/2023   AST 14 08/08/2023   NA 140 01/11/2024   K 4.3 01/11/2024   CL 102 01/11/2024   CREATININE 0.86 01/11/2024   BUN 16 01/11/2024   CO2 25 01/11/2024   TSH 3.30 08/08/2023   PSA 0.23 01/11/2024   HGBA1C 8.4 (H) 01/11/2024   MICROALBUR <0.7 08/08/2023    MR FOOT LEFT WO CONTRAST Result Date: 09/13/2023 MR FOOT WITHOUT IV CONTRAST LEFT COMPARISON: None. CLINICAL HISTORY: Left foot pain PULSE SEQUENCES: Ax T1, Ax T2 FS, Sag T1, Sag T2 FS, Cor STIR, Cor T1 without contrast. FINDINGS: Bones: There is no fracture or marrow replacing process. No significant joint effusion is identified. Minimal degenerative changes are identified without accelerated arthrosis. Musculotendinous structures: There is mild nodular thickening of the plantar fascia suggesting plantar fibromatosis in the area of the placed vitamin E capsule. No edema or collection is present. No aggressive features. Otherwise, the visualized musculotendinous structures and joint no significant abnormality. Very mild edema is seen in the flexor hallucis brevis muscle and the medial forefoot. IMPRESSION: Mild degenerative change. No accelerated arthrosis or acute bony abnormality. Mild nodular thickening of the medial plantar fascia in the area of palpable concern. This has the appearance of benign plantar fibromatosis. No aggressive features. Electronically signed by: Norleen Satchel MD 09/13/2023 02:50 PM EDT RP Workstation: [id number]   DG Thoracic Spine W/Swimmers Result Date: 01/11/2024 CLINICAL DATA:  Right posterior chest wall pain. EXAM: THORACIC SPINE - 3 VIEWS; CHEST - 2 VIEW COMPARISON:  None Available. FINDINGS: No focal consolidation, pleural effusion, or pneumothorax. Top-normal cardiac size. No acute osseous pathology. IMPRESSION: No active cardiopulmonary  disease. Electronically Signed   By: Vanetta Chou M.D.   On: 01/11/2024 16:30   DG Chest 2  View Result Date: 01/11/2024 CLINICAL DATA:  Right posterior chest wall pain. EXAM: THORACIC SPINE - 3 VIEWS; CHEST - 2 VIEW COMPARISON:  None Available. FINDINGS: No focal consolidation, pleural effusion, or pneumothorax. Top-normal cardiac size. No acute osseous pathology. IMPRESSION: No active cardiopulmonary disease. Electronically Signed   By: Vanetta Chou M.D.   On: 01/11/2024 16:30     Assessment & Plan:  Type 1 diabetes mellitus with hyperglycemia (HCC)- A1C is up to 8.4%. -     Basic metabolic panel with GFR; Future -     Hemoglobin A1c; Future -     Hepatitis B surface antibody,quantitative; Future  Pure hyperglyceridemia -     Lipid panel; Future  Hyperlipidemia with target LDL less than 100- LDL goal achieved. Doing well on the statin  -     Lipid panel; Future -     Atorvastatin  Calcium ; Take 1 tablet (80 mg total) by mouth daily.  Dispense: 90 tablet; Refill: 1  Primary hypertension- BP is well controlled. -     CBC with Differential/Platelet; Future -     Basic metabolic panel with GFR; Future -     Losartan  Potassium; Take 1 tablet (50 mg total) by mouth daily.  Dispense: 90 tablet; Refill: 1 -     Urinalysis, Routine w reflex microscopic; Future  Encounter for general adult medical examination with abnormal findings- Exam completed, labs reviewed, vaccines reviewed, cancer screenings addressed, pt ed material was given.  -     PSA; Future  Seasonal allergic rhinitis due to pollen -     Levocetirizine Dihydrochloride ; TAKE 1 TABLET BY MOUTH IN THE EVENING  Dispense: 90 tablet; Refill: 1  Diabetes 1.5, managed as type 1 (HCC) -     Losartan  Potassium; Take 1 tablet (50 mg total) by mouth daily.  Dispense: 90 tablet; Refill: 1 -     Urinalysis, Routine w reflex microscopic; Future  Current mild episode of major depressive disorder without prior episode (HCC) -     traZODone  HCl; Take 1 tablet (50 mg total) by mouth at bedtime.  Dispense: 90 tablet; Refill:  1  Acute bilateral thoracic back pain -     DG Thoracic Spine W/Swimmers; Future  Right-sided chest wall pain -     DG Chest 2 View; Future     Follow-up: Return in about 6 months (around 07/13/2024).  Debby Molt, MD

## 2024-01-11 NOTE — Patient Instructions (Signed)
 Health Maintenance, Male  Adopting a healthy lifestyle and getting preventive care are important in promoting health and wellness. Ask your health care provider about:  The right schedule for you to have regular tests and exams.  Things you can do on your own to prevent diseases and keep yourself healthy.  What should I know about diet, weight, and exercise?  Eat a healthy diet    Eat a diet that includes plenty of vegetables, fruits, low-fat dairy products, and lean protein.  Do not eat a lot of foods that are high in solid fats, added sugars, or sodium.  Maintain a healthy weight  Body mass index (BMI) is a measurement that can be used to identify possible weight problems. It estimates body fat based on height and weight. Your health care provider can help determine your BMI and help you achieve or maintain a healthy weight.  Get regular exercise  Get regular exercise. This is one of the most important things you can do for your health. Most adults should:  Exercise for at least 150 minutes each week. The exercise should increase your heart rate and make you sweat (moderate-intensity exercise).  Do strengthening exercises at least twice a week. This is in addition to the moderate-intensity exercise.  Spend less time sitting. Even light physical activity can be beneficial.  Watch cholesterol and blood lipids  Have your blood tested for lipids and cholesterol at 61 years of age, then have this test every 5 years.  You may need to have your cholesterol levels checked more often if:  Your lipid or cholesterol levels are high.  You are older than 61 years of age.  You are at high risk for heart disease.  What should I know about cancer screening?  Many types of cancers can be detected early and may often be prevented. Depending on your health history and family history, you may need to have cancer screening at various ages. This may include screening for:  Colorectal cancer.  Prostate cancer.  Skin cancer.  Lung  cancer.  What should I know about heart disease, diabetes, and high blood pressure?  Blood pressure and heart disease  High blood pressure causes heart disease and increases the risk of stroke. This is more likely to develop in people who have high blood pressure readings or are overweight.  Talk with your health care provider about your target blood pressure readings.  Have your blood pressure checked:  Every 3-5 years if you are 24-52 years of age.  Every year if you are 3 years old or older.  If you are between the ages of 60 and 72 and are a current or former smoker, ask your health care provider if you should have a one-time screening for abdominal aortic aneurysm (AAA).  Diabetes  Have regular diabetes screenings. This checks your fasting blood sugar level. Have the screening done:  Once every three years after age 66 if you are at a normal weight and have a low risk for diabetes.  More often and at a younger age if you are overweight or have a high risk for diabetes.  What should I know about preventing infection?  Hepatitis B  If you have a higher risk for hepatitis B, you should be screened for this virus. Talk with your health care provider to find out if you are at risk for hepatitis B infection.  Hepatitis C  Blood testing is recommended for:  Everyone born from [birth year] through 1963.  Anyone  with known risk factors for hepatitis C.  Sexually transmitted infections (STIs)  You should be screened each year for STIs, including gonorrhea and chlamydia, if:  You are sexually active and are younger than 61 years of age.  You are older than 61 years of age and your health care provider tells you that you are at risk for this type of infection.  Your sexual activity has changed since you were last screened, and you are at increased risk for chlamydia or gonorrhea. Ask your health care provider if you are at risk.  Ask your health care provider about whether you are at high risk for HIV. Your health care provider  may recommend a prescription medicine to help prevent HIV infection. If you choose to take medicine to prevent HIV, you should first get tested for HIV. You should then be tested every 3 months for as long as you are taking the medicine.  Follow these instructions at home:  Alcohol use  Do not drink alcohol if your health care provider tells you not to drink.  If you drink alcohol:  Limit how much you have to 0-2 drinks a day.  Know how much alcohol is in your drink. In the [place] one drink equals one 12 oz bottle of beer (355 mL), one 5 oz glass of wine (148 mL), or one 1 oz glass of hard liquor (44 mL).  Lifestyle  Do not use any products that contain nicotine or tobacco. These products include cigarettes, chewing tobacco, and vaping devices, such as e-cigarettes. If you need help quitting, ask your health care provider.  Do not use street drugs.  Do not share needles.  Ask your health care provider for help if you need support or information about quitting drugs.  General instructions  Schedule regular health, dental, and eye exams.  Stay current with your vaccines.  Tell your health care provider if:  You often feel depressed.  You have ever been abused or do not feel safe at home.  Summary  Adopting a healthy lifestyle and getting preventive care are important in promoting health and wellness.  Follow your health care provider's instructions about healthy diet, exercising, and getting tested or screened for diseases.  Follow your health care provider's instructions on monitoring your cholesterol and blood pressure.  This information is not intended to replace advice given to you by your health care provider. Make sure you discuss any questions you have with your health care provider.  Document Revised: 09/22/2020 Document Reviewed: 09/22/2020  Elsevier Patient Education  2024 [employer].

## 2024-01-12 LAB — LIPID PANEL
Cholesterol: 108 mg/dL (ref 0–200)
HDL: 42.6 mg/dL (ref >39.00)
LDL Cholesterol: 38 mg/dL (ref 0–99)
NonHDL: 65.01
Total CHOL/HDL Ratio: 3
Triglycerides: 136 mg/dL (ref 0.0–149.0)
VLDL: 27.2 mg/dL (ref 0.0–40.0)

## 2024-01-12 LAB — BASIC METABOLIC PANEL WITH GFR
BUN: 16 mg/dL (ref 6–23)
CO2: 25 meq/L (ref 19–32)
Calcium: 9.1 mg/dL (ref 8.4–10.5)
Chloride: 102 meq/L (ref 96–112)
Creatinine, Ser: 0.86 mg/dL (ref 0.40–1.50)
GFR: 93.94 mL/min (ref >60.00)
Glucose, Bld: 183 mg/dL — ABNORMAL HIGH (ref 70–99)
Potassium: 4.3 meq/L (ref 3.5–5.1)
Sodium: 140 meq/L (ref 135–145)

## 2024-01-12 LAB — PSA: PSA: 0.23 ng/mL (ref 0.10–4.00)

## 2024-01-12 LAB — HEPATITIS B SURFACE ANTIBODY, QUANTITATIVE: Hep B S AB Quant (Post): <5 m[IU]/mL — ABNORMAL LOW (ref >=10)

## 2024-01-13 NOTE — Telephone Encounter (Signed)
 Pt declines on changing meters since he already has one. Please submit appeal for test strips.

## 2024-01-14 ENCOUNTER — Encounter: Payer: Self-pay | Admitting: Internal Medicine

## 2024-01-17 ENCOUNTER — Other Ambulatory Visit (INDEPENDENT_AMBULATORY_CARE_PROVIDER_SITE_OTHER)

## 2024-01-17 ENCOUNTER — Other Ambulatory Visit: Payer: Self-pay | Admitting: Internal Medicine

## 2024-01-17 ENCOUNTER — Other Ambulatory Visit: Payer: Self-pay

## 2024-01-17 ENCOUNTER — Other Ambulatory Visit (HOSPITAL_COMMUNITY): Payer: Self-pay

## 2024-01-17 DIAGNOSIS — E139 Other specified diabetes mellitus without complications: Secondary | ICD-10-CM

## 2024-01-17 DIAGNOSIS — I10 Essential (primary) hypertension: Secondary | ICD-10-CM

## 2024-01-17 DIAGNOSIS — R0789 Other chest pain: Secondary | ICD-10-CM | POA: Diagnosis not present

## 2024-01-17 LAB — URINALYSIS, ROUTINE W REFLEX MICROSCOPIC
Bilirubin Urine: NEGATIVE
Hgb urine dipstick: NEGATIVE
Ketones, ur: NEGATIVE
Leukocytes,Ua: NEGATIVE
Nitrite: NEGATIVE
RBC / HPF: NONE SEEN (ref 0–?)
Specific Gravity, Urine: 1.02 (ref 1.000–1.030)
Total Protein, Urine: NEGATIVE
Urine Glucose: >=1000 — AB
Urobilinogen, UA: 0.2 (ref 0.0–1.0)
WBC, UA: NONE SEEN (ref 0–?)
pH: 5.5 (ref 5.0–8.0)

## 2024-01-17 LAB — D-DIMER, QUANTITATIVE: D-Dimer, Quant: <0.19 ug{FEU}/mL (ref <0.50)

## 2024-01-17 MED ORDER — INSULIN LISPRO 100 UNIT/ML IJ SOLN
INTRAMUSCULAR | 3 refills | Status: AC
Start: 2024-01-17 — End: ?
  Filled 2024-01-17: qty 40, 28d supply, fill #0
  Filled 2024-02-14: qty 40, 28d supply, fill #1
  Filled 2024-03-14: qty 40, 28d supply, fill #2

## 2024-01-17 NOTE — Progress Notes (Unsigned)
 [facility] [address] [place] [address] Phone: [phone] Subjective:   Zachary Graham, am serving as a scribe for Dr. Arthea Claudene.  I'm seeing this patient by the request  of:  Zachary Debby CROME, MD  CC: Back pain  YEP:Dlagzrupcz  Zachary Graham is a 63 y.o. male coming in with complaint of back pain. OMT 11/21/2023. Patient states that last Monday he developed acute lower thoracic upper lumbar spine pain on the R side. Pain is less when resting. Twisting, coughing and sneezing increases his pain. Using Advil  prn.   Medications patient has been prescribed: None  Taking:  Solids primary care provider did have laboratory workup that was fairly unremarkable except for some elevation in his A1c of 8.4.  Imaging showed no sign of any type of kidney stone.       Reviewed prior external information including notes and imaging from previsou exam, outside providers and external EMR if available.   As well as notes that were available from care everywhere and other healthcare systems.  Past medical history, social, surgical and family history all reviewed in electronic medical record.  No pertanent information unless stated regarding to the chief complaint.   Past Medical History:  Diagnosis Date   Diabetes mellitus    GERD (gastroesophageal reflux disease)    Heart murmur    Hyperlipidemia    Hypertension    OSA (obstructive sleep apnea)    Pneumonia     Allergies  Allergen Reactions   Egg-Derived Products Nausea And Vomiting   Lisinopril Swelling and Other (See Comments)    Extreme facial swelling causing hospitalization   Shellfish Allergy Anaphylaxis   Zetia  [Ezetimibe ] Other (See Comments)    ABDOMINAL CRAMPING   Dulaglutide  Other (See Comments)   Gabapentin     Other reaction(s): Dizziness   Gabapentin (Once-Daily) Other (See Comments)   Iodine    Penicillins Other (See Comments)    UNSPECIFIED REACTION > Childhood allergy      Cymbalta  [Duloxetine  Hcl] Other (See Comments)    Severe feet cramps     Review of Systems:  No headache, visual changes, nausea, vomiting, diarrhea, constipation, dizziness, abdominal pain, skin rash, fevers, chills, night sweats, weight loss, swollen lymph nodes, body aches, joint swelling, chest pain, shortness of breath, mood changes. POSITIVE muscle aches  Objective  Blood pressure 122/78, pulse 60, height 5' 9.5 (1.765 m), weight 233 lb (105.7 kg), SpO2 97%.   General: No apparent distress alert and oriented x3 mood and affect normal, dressed appropriately.  HEENT: Pupils equal, extraocular movements intact  Respiratory: Patient's speak in full sentences and does not appear short of breath  Cardiovascular: No lower extremity edema, non tender, no erythema  Gait MSK:  Back shows significant tightness noted.  Seems to be severe overall.  Patient has difficulty with rotation and sidebending to the right.  No palpable mass noted.  No CVA tenderness noted.  Seems to be more of the quadratus lumborum.      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  We do believe that this is contributing.  We discussed with patient about home exercises, given Toradol  injection but held on any Depo-Medrol  secondary to sugars being elevated again.  Last A1c was 8.4.  Given Zanaflex  to take at night.  Given range of motion exercises that I think will be helpful.  Discussed icing regimen increase activity slowly.  Follow-up again in  6 to 8 weeks otherwise.  Patient does have an appointment scheduled for next week and told him to keep it if necessary.       The above documentation has been reviewed and is accurate and complete Nicoya Friel M Janine Reller, DO          Note: This dictation was prepared with Dragon dictation along with smaller phrase technology. Any transcriptional errors that result from this process are unintentional.

## 2024-01-18 ENCOUNTER — Other Ambulatory Visit (HOSPITAL_COMMUNITY): Payer: Self-pay

## 2024-01-18 ENCOUNTER — Other Ambulatory Visit: Payer: Self-pay

## 2024-01-18 ENCOUNTER — Ambulatory Visit (INDEPENDENT_AMBULATORY_CARE_PROVIDER_SITE_OTHER): Admitting: Family Medicine

## 2024-01-18 VITALS — BP 122/78 | HR 60 | Ht 69.5 in | Wt 233.0 lb

## 2024-01-18 DIAGNOSIS — M62830 Muscle spasm of back: Secondary | ICD-10-CM | POA: Insufficient documentation

## 2024-01-18 MED ORDER — TIZANIDINE HCL 4 MG PO TABS
4.0000 mg | ORAL_TABLET | Freq: Every day | ORAL | 0 refills | Status: DC
  Filled 2024-01-18: qty 30, 30d supply, fill #0

## 2024-01-18 MED ORDER — KETOROLAC TROMETHAMINE 60 MG/2ML IM SOLN
60.0000 mg | Freq: Once | INTRAMUSCULAR | Status: AC
  Administered 2024-01-18: 60 mg via INTRAMUSCULAR

## 2024-01-18 NOTE — Assessment & Plan Note (Signed)
 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  We do believe that this is contributing.  We discussed with patient about home exercises, given Toradol  injection but held on any Depo-Medrol  secondary to sugars being elevated again.  Last A1c was 8.4.  Given Zanaflex  to take at night.  Given range of motion exercises that I think will be helpful.  Discussed icing regimen increase activity slowly.  Follow-up again in 6 to 8 weeks otherwise.  Patient does have an appointment scheduled for next week and told him to keep it if necessary.

## 2024-01-18 NOTE — Patient Instructions (Signed)
 Zanaflex  4mg  at night Keep appt next week

## 2024-01-19 ENCOUNTER — Other Ambulatory Visit: Payer: Self-pay

## 2024-01-19 ENCOUNTER — Other Ambulatory Visit (HOSPITAL_COMMUNITY): Payer: Self-pay

## 2024-01-19 ENCOUNTER — Ambulatory Visit

## 2024-01-19 DIAGNOSIS — Z23 Encounter for immunization: Secondary | ICD-10-CM

## 2024-01-19 NOTE — Progress Notes (Signed)
 Patient received both flu and Hep B vaccine today and responded well to both vaccines.

## 2024-01-20 NOTE — Progress Notes (Unsigned)
 [facility] [address] [place] [address] Phone: [phone] Subjective:   Zachary Graham, am serving as a scribe for Dr. Arthea Claudene.  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  We do believe that this is contributing.  We discussed with patient about home exercises, given Toradol  injection but held on any Depo-Medrol  secondary to sugars being elevated again.  Last A1c was 8.4.  Given Zanaflex  to take at night.  Given range of motion exercises that I think will be helpful.  Discussed icing regimen increase activity slowly.  Follow-up again in 6 to 8 weeks otherwise.  Patient does have an appointment scheduled for next week and told him to keep it if necessary.      Update 01/23/2024 Zachary Graham is a 63 y.o. male coming in with complaint of lumbar spine pain.  Was having severe amount of discomfort and pain mostly in the quadratus lumborum with multiple trigger points noted.  Patient states that his pain is radiating into lumbar spine. Less pain in thoracic spine. Pain is constant and worsens with movement.  Patient states that now seems to be much more lower than what it was previously.  Does not know if this is a migration from the previous 1 or different.  Describes this is more of an aching sensation on a regular basis.  Very intermittent radicular symptoms.      Past Medical History:  Diagnosis Date   Diabetes mellitus    GERD (gastroesophageal reflux disease)    Heart murmur    Hyperlipidemia    Hypertension    OSA (obstructive sleep apnea)    Pneumonia    Past Surgical History:  Procedure Laterality Date   CERVICAL DISC ARTHROPLASTY N/A 03/16/2017   Procedure: CERVICAL FIVE- CERVICAL SIX DISC ARTHROPLASTY;  Surgeon: Alix Charleston, MD;  Location: [facility] OR;  Service: Neurosurgery;  Laterality: N/A;  CERVICAL 5- CERVICAL  6 DISC ARTHROPLASTY   COLONOSCOPY     TONSILLECTOMY     Social History   Socioeconomic History   Marital status: Married    Spouse name: Not on file   Number of children: 3   Years of education: Not on file   Highest education level: Master's degree (e.g., MA, MS, MEng, MEd, MSW, MBA)  Occupational History   Occupation: RADIATION SAFETY OFFICER    Employer: [employer]  Tobacco Use   Smoking status: Never    Passive exposure: Never   Smokeless tobacco: Never  Vaping Use   Vaping status: Never Used  Substance and Sexual Activity   Alcohol use: No    Alcohol/week: 0.0 standard drinks of alcohol   Drug use: No   Sexual activity: Yes    Partners: Female  Other Topics Concern   Not on file  Social History Narrative   Regular exercise: runs 3 miles 3x week, takes one day off per week to rest   Caffeine use: stopped all diet soda on 04/23/15 per advice from Sports MD   Social Drivers of Health   Financial Resource Strain: Low Risk  (01/08/2024)   Overall Financial Resource Strain (CARDIA)    Difficulty of Paying Living Expenses: Not hard at all  Food Insecurity: No Food Insecurity (01/08/2024)   Hunger Vital Sign    Worried About Running Out of Food in the Last Year: Never true  Ran Out of Food in the Last Year: Never true  Transportation Needs: No Transportation Needs (01/08/2024)   PRAPARE - Administrator, Civil Service (Medical): No    Lack of Transportation (Non-Medical): No  Physical Activity: Sufficiently Active (01/08/2024)   Exercise Vital Sign    Days of Exercise per Week: 4 days    Minutes of Exercise per Session: 50 min  Stress: No Stress Concern Present (01/08/2024)   [employer] of Occupational Health - Occupational Stress Questionnaire    Feeling of Stress: Not at all  Social Connections: Moderately Isolated (01/08/2024)   Social Connection and Isolation Panel    Frequency of Communication with Friends and Family: Once a week    Frequency of  Social Gatherings with Friends and Family: Once a week    Attends Religious Services: Patient declined    Database administrator or Organizations: Yes    Attends Banker Meetings: 1 to 4 times per year    Marital Status: Married   Allergies  Allergen Reactions   Egg-Derived Products Nausea And Vomiting   Lisinopril Swelling and Other (See Comments)    Extreme facial swelling causing hospitalization   Shellfish Allergy Anaphylaxis   Zetia  [Ezetimibe ] Other (See Comments)    ABDOMINAL CRAMPING   Dulaglutide  Other (See Comments)   Gabapentin     Other reaction(s): Dizziness   Gabapentin (Once-Daily) Other (See Comments)   Iodine    Penicillins Other (See Comments)    UNSPECIFIED REACTION > Childhood allergy     Cymbalta  [Duloxetine  Hcl] Other (See Comments)    Severe feet cramps   Family History  Problem Relation Age of Onset   Colon cancer Maternal Grandfather    Colon cancer Paternal Grandfather    Heart disease Father        Valve replacement; pacemaker   Alcohol abuse Neg Hx    Asthma Neg Hx    Diabetes Neg Hx    Hyperlipidemia Neg Hx    Hypertension Neg Hx    Kidney disease Neg Hx    Stroke Neg Hx     Current Outpatient Medications (Endocrine & Metabolic):    dapagliflozin  propanediol (FARXIGA ) 5 MG TABS tablet, Take 1 tablet (5 mg total) by mouth daily before breakfast.   Glucagon  3 MG/DOSE POWD, Place 3 mg into the nose once as needed for up to 1 dose.   GVOKE HYPOPEN  1-PACK 1 MG/0.2ML SOAJ, Inject under skin 0.2 mL as needed for hypoglycemia   insulin  glargine-yfgn (SEMGLEE) 100 UNIT/ML injection, Inject into the skin.   insulin  lispro (HUMALOG ) 100 UNIT/ML injection, Inject up to 150 units into insulin  pump daily.   metFORMIN  (GLUCOPHAGE ) 500 MG tablet, Take 2 tablets (1,000 mg total) by mouth 2 (two) times daily before a meal.   Semaglutide,0.25 or 0.5MG /DOS, 2 MG/3ML SOPN, Inject into the skin once a week.  Current Outpatient Medications  (Cardiovascular):    atorvastatin  (LIPITOR) 80 MG tablet, Take 1 tablet (80 mg total) by mouth daily.   EPINEPHrine  0.3 mg/0.3 mL IJ SOAJ injection, Inject 0.3 mg into the muscle as needed for anaphylaxis.   losartan  (COZAAR ) 50 MG tablet, Take 1 tablet (50 mg total) by mouth daily.   metoprolol  succinate (TOPROL  XL) 25 MG 24 hr tablet, Take 1 tablet (25 mg total) by mouth daily.  Current Outpatient Medications (Respiratory):    azelastine  (ASTELIN ) 0.1 % nasal spray, Place 2 sprays into both nostrils 2 (two) times daily. Use in each nostril  as directed   Beclomethasone Dipropionate  (QNASL ) 80 MCG/ACT AERS, Place 2 puffs into the nose daily as needed (allergies).   ipratropium (ATROVENT) 0.06 % nasal spray, Place 2 sprays into the nose as needed for rhinitis.   levocetirizine (XYZAL ) 5 MG tablet, TAKE 1 TABLET BY MOUTH IN THE EVENING  Current Outpatient Medications (Analgesics):    aspirin EC 81 MG tablet, Take by mouth daily.  Current Outpatient Medications (Hematological):    ferrous sulfate  325 (65 FE) MG tablet, Take 1 tablet (325 mg total) by mouth daily with breakfast.  Current Outpatient Medications (Other):    Alpha-Lipoic Acid 600 MG CAPS, Take 1 capsule by mouth daily.    Ascorbic Acid (VITAMIN C ) 1000 MG tablet, Take 1,000 mg by mouth daily.   b complex vitamins tablet, Take 1 tablet by mouth daily.   BAYER MICROLET LANCETS lancets, USE TO TEST BLOOD SUGAR 6 TIMES A DAY   Calcium -Magnesium -Vitamin D  (CALCIUM  500 PO), Take 1 tablet by mouth daily.    clotrimazole -betamethasone  (LOTRISONE ) cream, Apply to affected areas 2 (two) times daily. STOP USING AFTER 2 WEEKS   Continuous Glucose Sensor (DEXCOM G7 SENSOR) MISC, Apply 1 sensor every 10 days   cyclobenzaprine  (FLEXERIL ) 10 MG tablet, Take 1 tablet (10 mg total) by mouth 3 (three) times daily as needed for muscle spasms.   famotidine (PEPCID) 40 MG tablet, Take 1 tablet by mouth at bedtime.   glucose blood (CONTOUR NEXT TEST)  test strip, USE AS INSTRUCTED TO CHECK SUGAR 3 TIMES DAILY   hydrOXYzine  (ATARAX ) 10 MG tablet, Take 1 tablet by mouth at bedtime as needed.   Magnesium  Oxide 400 MG CAPS, Take 1 capsule (400 mg total) by mouth daily.   NON FORMULARY, CPAP   omeprazole (PRILOSEC) 40 MG capsule, Take 20 mg by mouth daily.   tiZANidine  (ZANAFLEX ) 4 MG tablet, Take 1 tablet (4 mg total) by mouth at bedtime.   traZODone  (DESYREL ) 50 MG tablet, Take 1 tablet (50 mg total) by mouth at bedtime.   Reviewed prior external information including notes and imaging from  primary care provider As well as notes that were available from care everywhere and other healthcare systems.  Past medical history, social, surgical and family history all reviewed in electronic medical record.  No pertanent information unless stated regarding to the chief complaint.   Review of Systems:  No headache, visual changes, nausea, vomiting, diarrhea, constipation, dizziness, abdominal pain, skin rash, fevers, chills, night sweats, weight loss, swollen lymph nodes, body aches, joint swelling, chest pain, shortness of breath, mood changes. POSITIVE muscle aches  Objective  Blood pressure 124/74, pulse 60, height 5' 9.5 (1.765 m), weight 229 lb (103.9 kg), SpO2 97%.   General: No apparent distress alert and oriented x3 mood and affect normal, dressed appropriately.  HEENT: Pupils equal, extraocular movements intact  Respiratory: Patient's speak in full sentences and does not appear short of breath  Cardiovascular: No lower extremity edema, non tender, no erythema  Back exam shows tightness with flexion extension but significant improvement from previous exam.  Still some discomfort in the thoracolumbar juncture with rotation but severe tenderness noted more just to the right of midline at the L5-S1 area.  Tightness with straight leg test but no true radicular symptoms but patient does have voluntary guarding noted.  4 out of 5 strength of  dorsiflexion of the foot bilaterally.    Impression and Recommendations:  Low back pain Acute worsening right-sided low back pain.  Patient is taking  care of his ailing dog.  This could be contributing.  We discussed with patient at great length though and I do think a repeat x-ray of the low radiation would be beneficial to make sure there is no acute fracture with the severity of this as well as the progression from the thoracolumbar area to the lumbosacral area all on the right side.  Likely no radicular symptoms at the moment.  Do feel at this point we need to continue to monitor though.  Follow-up with me again in 4 weeks.  Worsening pain advanced imaging would be necessary including an MRI which I will order now if patient does not make significant improvement. Injection given today.   The above documentation has been reviewed and is accurate and complete Dnya Hickle M Eleanore Junio, DO

## 2024-01-20 NOTE — Telephone Encounter (Signed)
 Labs has been drawn and hepB has been given

## 2024-01-23 ENCOUNTER — Ambulatory Visit (INDEPENDENT_AMBULATORY_CARE_PROVIDER_SITE_OTHER): Admitting: Family Medicine

## 2024-01-23 ENCOUNTER — Ambulatory Visit

## 2024-01-23 VITALS — BP 124/74 | HR 60 | Ht 69.5 in | Wt 229.0 lb

## 2024-01-23 DIAGNOSIS — M47816 Spondylosis without myelopathy or radiculopathy, lumbar region: Secondary | ICD-10-CM | POA: Diagnosis not present

## 2024-01-23 DIAGNOSIS — G8929 Other chronic pain: Secondary | ICD-10-CM

## 2024-01-23 DIAGNOSIS — M545 Low back pain, unspecified: Secondary | ICD-10-CM

## 2024-01-23 DIAGNOSIS — M5136 Other intervertebral disc degeneration, lumbar region with discogenic back pain only: Secondary | ICD-10-CM | POA: Diagnosis not present

## 2024-01-23 MED ORDER — KETOROLAC TROMETHAMINE 60 MG/2ML IM SOLN
60.0000 mg | Freq: Once | INTRAMUSCULAR | Status: AC
  Administered 2024-01-23: 60 mg via INTRAMUSCULAR

## 2024-01-23 NOTE — Patient Instructions (Addendum)
 Xray today MRI [id number] We will be in touch See me again in 4 to 5 weeks.

## 2024-01-23 NOTE — Assessment & Plan Note (Signed)
 Acute worsening right-sided low back pain.  Patient is taking care of his ailing dog.  This could be contributing.  We discussed with patient at great length though and I do think a repeat x-ray of the low radiation would be beneficial to make sure there is no acute fracture with the severity of this as well as the progression from the thoracolumbar area to the lumbosacral area all on the right side.  Likely no radicular symptoms at the moment.  Do feel at this point we need to continue to monitor though.  Follow-up with me again in 4 weeks.  Worsening pain advanced imaging would be necessary including an MRI which I will order now if patient does not make significant improvement.

## 2024-01-30 ENCOUNTER — Other Ambulatory Visit (HOSPITAL_COMMUNITY): Payer: Self-pay

## 2024-01-30 NOTE — Telephone Encounter (Signed)
 Just to close the loop... the pharmacy was able to use an over-ride for the pt to get the test strips.

## 2024-01-31 ENCOUNTER — Other Ambulatory Visit: Payer: Self-pay

## 2024-01-31 ENCOUNTER — Encounter: Payer: Self-pay | Admitting: Family Medicine

## 2024-01-31 DIAGNOSIS — K5732 Diverticulitis of large intestine without perforation or abscess without bleeding: Secondary | ICD-10-CM

## 2024-02-02 ENCOUNTER — Telehealth: Payer: Self-pay | Admitting: Cardiovascular Disease

## 2024-02-02 NOTE — Telephone Encounter (Signed)
 Patient came by to see if we could schedule him with Dr Wonda and Dr Wonda is not taking new patients at the moment. Tried calling to inform him since I was unsure at the time of patient being present, called and no answer. Informed him in the voicemail we could schedule him with the 3 new providers.

## 2024-02-08 ENCOUNTER — Encounter: Payer: Self-pay | Admitting: Internal Medicine

## 2024-02-08 ENCOUNTER — Ambulatory Visit: Admitting: Internal Medicine

## 2024-02-10 ENCOUNTER — Other Ambulatory Visit: Payer: Self-pay | Admitting: Internal Medicine

## 2024-02-10 DIAGNOSIS — E139 Other specified diabetes mellitus without complications: Secondary | ICD-10-CM

## 2024-02-13 ENCOUNTER — Ambulatory Visit
Admission: RE | Admit: 2024-02-13 | Discharge: 2024-02-13 | Disposition: A | Discharge status: Home / Self Care | Source: Ambulatory Visit | Attending: Internal Medicine | Admitting: Internal Medicine

## 2024-02-13 DIAGNOSIS — E139 Other specified diabetes mellitus without complications: Secondary | ICD-10-CM

## 2024-02-13 DIAGNOSIS — I2510 Atherosclerotic heart disease of native coronary artery without angina pectoris: Secondary | ICD-10-CM | POA: Diagnosis not present

## 2024-02-14 ENCOUNTER — Other Ambulatory Visit: Payer: Self-pay | Admitting: Family Medicine

## 2024-02-15 ENCOUNTER — Other Ambulatory Visit: Payer: Self-pay

## 2024-02-15 ENCOUNTER — Other Ambulatory Visit (HOSPITAL_COMMUNITY): Payer: Self-pay

## 2024-02-15 ENCOUNTER — Ambulatory Visit: Payer: Self-pay | Admitting: Internal Medicine

## 2024-02-15 MED ORDER — TIZANIDINE HCL 4 MG PO TABS
4.0000 mg | ORAL_TABLET | Freq: Every day | ORAL | 0 refills | Status: AC
  Filled 2024-02-15: qty 30, 30d supply, fill #0

## 2024-02-18 ENCOUNTER — Other Ambulatory Visit: Payer: Self-pay | Admitting: Internal Medicine

## 2024-02-18 ENCOUNTER — Other Ambulatory Visit (HOSPITAL_COMMUNITY): Payer: Self-pay

## 2024-02-18 DIAGNOSIS — E1065 Type 1 diabetes mellitus with hyperglycemia: Secondary | ICD-10-CM

## 2024-02-21 ENCOUNTER — Other Ambulatory Visit (HOSPITAL_COMMUNITY): Payer: Self-pay

## 2024-02-21 MED ORDER — METFORMIN HCL 500 MG PO TABS
1000.0000 mg | ORAL_TABLET | Freq: Two times a day (BID) | ORAL | 1 refills | Status: AC
  Filled 2024-02-21: qty 360, 90d supply, fill #0
  Filled 2024-05-18: qty 360, 90d supply, fill #1

## 2024-02-22 ENCOUNTER — Other Ambulatory Visit (HOSPITAL_COMMUNITY): Payer: Self-pay

## 2024-03-01 NOTE — Progress Notes (Signed)
 [facility] [address] [place] [address] Phone: [phone] Subjective:   Zachary Graham, am serving as a scribe for Dr. Arthea Claudene.  I'm seeing this patient by the request  of:  [name], MD  CC: Low back pain  YEP:Dlagzrupcz  01/23/2024 Acute worsening right-sided low back pain.  Patient is taking care of his ailing dog.  This could be contributing.  We discussed with patient at great length though and I do think a repeat x-ray of the low radiation would be beneficial to make sure there is no acute fracture with the severity of this as well as the progression from the thoracolumbar area to the lumbosacral area all on the right side.  Likely no radicular symptoms at the moment.  Do feel at this point we need to continue to monitor though.  Follow-up with me again in 4 weeks.  Worsening pain advanced imaging would be necessary including an MRI which I will order now if patient does not make significant improvement.      Update 03/02/2024 Zachary Graham is a 63 y.o. male coming in with complaint of lumbar spine pain. Patient states that his pain is intermittent. Pain in R side of thoracic and lumbar spine. No pain today. Will wake up in pain.   Stretching heel cord for L-sided plantar fasciitis. Also having L ankle pain as well.   Had some LLQ pain for 3 days. Unsure of cause of this pain. Feels like he has a groin pull on B hips, L>R.   Pain in R wrist over dorsal aspect of wrist.   Had recent calcium  score.      Past Medical History:  Diagnosis Date   Diabetes mellitus    GERD (gastroesophageal reflux disease)    Heart murmur    Hyperlipidemia    Hypertension    OSA (obstructive sleep apnea)    Pneumonia    Past Surgical History:  Procedure Laterality Date   CERVICAL DISC ARTHROPLASTY N/A 03/16/2017   Procedure: CERVICAL FIVE- CERVICAL SIX DISC ARTHROPLASTY;  Surgeon: Alix Charleston, MD;  Location: [facility] OR;  Service:  Neurosurgery;  Laterality: N/A;  CERVICAL 5- CERVICAL 6 DISC ARTHROPLASTY   COLONOSCOPY     TONSILLECTOMY     Social History   Socioeconomic History   Marital status: Married    Spouse name: Not on file   Number of children: 3   Years of education: Not on file   Highest education level: Master's degree (e.g., MA, MS, MEng, MEd, MSW, MBA)  Occupational History   Occupation: RADIATION SAFETY OFFICER    Employer: [employer]  Tobacco Use   Smoking status: Never    Passive exposure: Never   Smokeless tobacco: Never  Vaping Use   Vaping status: Never Used  Substance and Sexual Activity   Alcohol use: No    Alcohol/week: 0.0 standard drinks of alcohol   Drug use: No   Sexual activity: Yes    Partners: Female  Other Topics Concern   Not on file  Social History Narrative   Regular exercise: runs 3 miles 3x week, takes one day off per week to rest   Caffeine use: stopped all diet soda on 04/23/15 per advice from Sports MD   Social Drivers of Health   Financial Resource Strain: Low Risk  (01/08/2024)   Overall Financial Resource Strain (CARDIA)    Difficulty of Paying Living Expenses: Not hard at all  Food Insecurity: No  Food Insecurity (01/08/2024)   Hunger Vital Sign    Worried About Running Out of Food in the Last Year: Never true    Ran Out of Food in the Last Year: Never true  Transportation Needs: No Transportation Needs (01/08/2024)   PRAPARE - Administrator, Civil Service (Medical): No    Lack of Transportation (Non-Medical): No  Physical Activity: Sufficiently Active (01/08/2024)   Exercise Vital Sign    Days of Exercise per Week: 4 days    Minutes of Exercise per Session: 50 min  Stress: No Stress Concern Present (01/08/2024)   [employer] of Occupational Health - Occupational Stress Questionnaire    Feeling of Stress: Not at all  Social Connections: Moderately Isolated (01/08/2024)   Social Connection and Isolation Panel    Frequency of Communication  with Friends and Family: Once a week    Frequency of Social Gatherings with Friends and Family: Once a week    Attends Religious Services: Patient declined    Database administrator or Organizations: Yes    Attends Banker Meetings: 1 to 4 times per year    Marital Status: Married   Allergies  Allergen Reactions   Egg Protein-Containing Drug Products Nausea And Vomiting   Lisinopril Swelling and Other (See Comments)    Extreme facial swelling causing hospitalization   Shellfish Allergy Anaphylaxis   Zetia  [Ezetimibe ] Other (See Comments)    ABDOMINAL CRAMPING   Dulaglutide  Other (See Comments)   Gabapentin     Other reaction(s): Dizziness   Gabapentin (Once-Daily) Other (See Comments)   Iodine    Penicillins Other (See Comments)    UNSPECIFIED REACTION > Childhood allergy     Cymbalta  [Duloxetine  Hcl] Other (See Comments)    Severe feet cramps   Family History  Problem Relation Age of Onset   Colon cancer Maternal Grandfather    Colon cancer Paternal Grandfather    Heart disease Father        Valve replacement; pacemaker   Alcohol abuse Neg Hx    Asthma Neg Hx    Diabetes Neg Hx    Hyperlipidemia Neg Hx    Hypertension Neg Hx    Kidney disease Neg Hx    Stroke Neg Hx     Current Outpatient Medications (Endocrine & Metabolic):    dapagliflozin  propanediol (FARXIGA ) 5 MG TABS tablet, Take 1 tablet (5 mg total) by mouth daily before breakfast.   Glucagon  3 MG/DOSE POWD, Place 3 mg into the nose once as needed for up to 1 dose.   GVOKE HYPOPEN  1-PACK 1 MG/0.2ML SOAJ, Inject under skin 0.2 mL as needed for hypoglycemia   insulin  glargine-yfgn (SEMGLEE) 100 UNIT/ML injection, Inject into the skin.   insulin  lispro (HUMALOG ) 100 UNIT/ML injection, Inject up to 150 units into insulin  pump daily.   metFORMIN  (GLUCOPHAGE ) 500 MG tablet, Take 2 tablets (1,000 mg total) by mouth 2 (two) times daily before a meal.   Semaglutide,0.25 or 0.5MG /DOS, 2 MG/3ML SOPN, Inject  into the skin once a week.  Current Outpatient Medications (Cardiovascular):    atorvastatin  (LIPITOR) 80 MG tablet, Take 1 tablet (80 mg total) by mouth daily.   EPINEPHrine  0.3 mg/0.3 mL IJ SOAJ injection, Inject 0.3 mg into the muscle as needed for anaphylaxis.   losartan  (COZAAR ) 50 MG tablet, Take 1 tablet (50 mg total) by mouth daily.   metoprolol  succinate (TOPROL  XL) 25 MG 24 hr tablet, Take 1 tablet (25 mg total) by mouth daily.  Current Outpatient Medications (Respiratory):    azelastine  (ASTELIN ) 0.1 % nasal spray, Place 2 sprays into both nostrils 2 (two) times daily. Use in each nostril as directed   Beclomethasone Dipropionate  (QNASL ) 80 MCG/ACT AERS, Place 2 puffs into the nose daily as needed (allergies).   ipratropium (ATROVENT) 0.06 % nasal spray, Place 2 sprays into the nose as needed for rhinitis.   levocetirizine (XYZAL ) 5 MG tablet, Take 1 tablet (5 mg total) by mouth every evening.  Current Outpatient Medications (Analgesics):    aspirin EC 81 MG tablet, Take by mouth daily.  Current Outpatient Medications (Hematological):    ferrous sulfate  325 (65 FE) MG tablet, Take 1 tablet (325 mg total) by mouth daily with breakfast.  Current Outpatient Medications (Other):    Alpha-Lipoic Acid 600 MG CAPS, Take 1 capsule by mouth daily.    Ascorbic Acid (VITAMIN C ) 1000 MG tablet, Take 1,000 mg by mouth daily.   b complex vitamins tablet, Take 1 tablet by mouth daily.   BAYER MICROLET LANCETS lancets, USE TO TEST BLOOD SUGAR 6 TIMES A DAY   Calcium -Magnesium -Vitamin D  (CALCIUM  500 PO), Take 1 tablet by mouth daily.    clotrimazole -betamethasone  (LOTRISONE ) cream, Apply to affected areas 2 (two) times daily. STOP USING AFTER 2 WEEKS   Continuous Glucose Sensor (DEXCOM G7 SENSOR) MISC, Apply 1 sensor every 10 days   cyclobenzaprine  (FLEXERIL ) 10 MG tablet, Take 1 tablet (10 mg total) by mouth 3 (three) times daily as needed for muscle spasms.   famotidine (PEPCID) 40 MG  tablet, Take 1 tablet by mouth at bedtime.   glucose blood (CONTOUR NEXT TEST) test strip, USE AS INSTRUCTED TO CHECK SUGAR 3 TIMES DAILY   hydrOXYzine  (ATARAX ) 10 MG tablet, Take 1 tablet by mouth at bedtime as needed.   Magnesium  Oxide 400 MG CAPS, Take 1 capsule (400 mg total) by mouth daily.   NON FORMULARY, CPAP   omeprazole (PRILOSEC) 40 MG capsule, Take 20 mg by mouth daily.   tiZANidine  (ZANAFLEX ) 4 MG tablet, Take 1 tablet (4 mg total) by mouth at bedtime.   traZODone  (DESYREL ) 50 MG tablet, Take 1 tablet (50 mg total) by mouth at bedtime.   Reviewed prior external information including notes and imaging from  primary care provider As well as notes that were available from care everywhere and other healthcare systems.  Past medical history, social, surgical and family history all reviewed in electronic medical record.  No pertanent information unless stated regarding to the chief complaint.   Review of Systems:  No headache, visual changes, nausea, vomiting, diarrhea, constipation, dizziness, abdominal pain, skin rash, fevers, chills, night sweats, weight loss, swollen lymph nodes, body aches, joint swelling, chest pain, shortness of breath, mood changes. POSITIVE muscle aches  Objective  Blood pressure 106/82, pulse 82, height 5' 9.5 (1.765 m), weight 234 lb (106.1 kg), SpO2 98%.   General: No apparent distress alert and oriented x3 mood and affect normal, dressed appropriately.  HEENT: Pupils equal, extraocular movements intact  Respiratory: Patient's speak in full sentences and does not appear short of breath  Cardiovascular: No lower extremity edema, non tender, no erythema  Low back exam shows loss of lordosis noted.  Significant tenderness noted in the paraspinal musculature.  Some tightness noted on the right side of the neck as well.  Osteopathic findings  T9 extended rotated and side bent left L2 flexed rotated and side bent right Sacrum right on right      Impression and Recommendations:  Low back pain Chronic low back pain, discussed icing regimen and home exercises, discussed which activities to do and which ones to avoid.  Increase activity slowly.  Discussed icing regimen.  Follow-up again in 6 to 8 weeks otherwise.  Elevated coronary artery calcium  score Patient's cardiologist has retired recently and referred to new cardiologist for further evaluation with patient having an elevated calcium  score with patient also having type 1 diabetes.     Decision today to treat with OMT was based on Physical Exam  After verbal consent patient was treated with  ME, FPR techniques in  thoracic,  lumbar and sacral areas, all areas are chronic   Patient tolerated the procedure well with improvement in symptoms  Patient given exercises, stretches and lifestyle modifications  See medications in patient instructions if given  Patient will follow up in 4-8 weeks  The above documentation has been reviewed and is accurate and complete Raza Bayless M Daniel Johndrow, DO

## 2024-03-02 ENCOUNTER — Ambulatory Visit: Admitting: Family Medicine

## 2024-03-02 ENCOUNTER — Ambulatory Visit

## 2024-03-02 ENCOUNTER — Encounter: Payer: Self-pay | Admitting: Family Medicine

## 2024-03-02 VITALS — BP 106/82 | HR 82 | Ht 69.5 in | Wt 234.0 lb

## 2024-03-02 DIAGNOSIS — R931 Abnormal findings on diagnostic imaging of heart and coronary circulation: Secondary | ICD-10-CM | POA: Diagnosis not present

## 2024-03-02 DIAGNOSIS — M9903 Segmental and somatic dysfunction of lumbar region: Secondary | ICD-10-CM | POA: Diagnosis not present

## 2024-03-02 DIAGNOSIS — I493 Ventricular premature depolarization: Secondary | ICD-10-CM

## 2024-03-02 DIAGNOSIS — G8929 Other chronic pain: Secondary | ICD-10-CM | POA: Diagnosis not present

## 2024-03-02 DIAGNOSIS — M25552 Pain in left hip: Secondary | ICD-10-CM

## 2024-03-02 DIAGNOSIS — M9902 Segmental and somatic dysfunction of thoracic region: Secondary | ICD-10-CM

## 2024-03-02 DIAGNOSIS — M9904 Segmental and somatic dysfunction of sacral region: Secondary | ICD-10-CM

## 2024-03-02 DIAGNOSIS — M160 Bilateral primary osteoarthritis of hip: Secondary | ICD-10-CM | POA: Diagnosis not present

## 2024-03-02 DIAGNOSIS — M545 Low back pain, unspecified: Secondary | ICD-10-CM

## 2024-03-02 NOTE — Assessment & Plan Note (Signed)
 Patient's cardiologist has retired recently and referred to new cardiologist for further evaluation with patient having an elevated calcium  score with patient also having type 1 diabetes.

## 2024-03-02 NOTE — Patient Instructions (Signed)
 Elbow will be fine Tennis ball for wrist We will get you in for cardiology See me in 2 months

## 2024-03-02 NOTE — Assessment & Plan Note (Signed)
 Chronic low back pain, discussed icing regimen and home exercises, discussed which activities to do and which ones to avoid.  Increase activity slowly.  Discussed icing regimen.  Follow-up again in 6 to 8 weeks otherwise.

## 2024-03-07 ENCOUNTER — Ambulatory Visit: Payer: Self-pay | Admitting: Family Medicine

## 2024-03-09 ENCOUNTER — Ambulatory Visit: Admitting: Internal Medicine

## 2024-03-14 ENCOUNTER — Encounter: Payer: Self-pay | Admitting: Internal Medicine

## 2024-03-14 ENCOUNTER — Ambulatory Visit (INDEPENDENT_AMBULATORY_CARE_PROVIDER_SITE_OTHER): Admitting: Internal Medicine

## 2024-03-14 ENCOUNTER — Other Ambulatory Visit (HOSPITAL_COMMUNITY): Payer: Self-pay

## 2024-03-14 VITALS — BP 118/70 | HR 66 | Resp 20 | Ht 69.5 in | Wt 238.6 lb

## 2024-03-14 DIAGNOSIS — E66811 Obesity, class 1: Secondary | ICD-10-CM

## 2024-03-14 DIAGNOSIS — E1065 Type 1 diabetes mellitus with hyperglycemia: Secondary | ICD-10-CM

## 2024-03-14 DIAGNOSIS — G4733 Obstructive sleep apnea (adult) (pediatric): Secondary | ICD-10-CM

## 2024-03-14 DIAGNOSIS — E785 Hyperlipidemia, unspecified: Secondary | ICD-10-CM

## 2024-03-14 LAB — POCT GLYCOSYLATED HEMOGLOBIN (HGB A1C): Hemoglobin A1C: 7.6 % — AB (ref 4.0–5.6)

## 2024-03-14 MED ORDER — INSULIN PEN NEEDLE 32G X 4 MM MISC
3 refills | Status: AC
  Filled 2024-03-14: qty 100, 90d supply, fill #0
  Filled 2024-06-05 – 2024-06-14 (×2): qty 100, 90d supply, fill #1

## 2024-03-14 MED ORDER — TIRZEPATIDE 2.5 MG/0.5ML ~~LOC~~ SOAJ
2.5000 mg | SUBCUTANEOUS | 1 refills | Status: DC
  Filled 2024-03-14 – 2024-03-15 (×3): qty 2, 28d supply, fill #0

## 2024-03-14 MED ORDER — GLUCAGON 3 MG/DOSE NA POWD
3.0000 mg | Freq: Once | NASAL | 11 refills | Status: AC | PRN
  Filled 2024-03-14: qty 1, 30d supply, fill #0

## 2024-03-14 MED ORDER — DAPAGLIFLOZIN PROPANEDIOL 5 MG PO TABS
5.0000 mg | ORAL_TABLET | Freq: Every day | ORAL | 3 refills | Status: AC
  Filled 2024-03-14 – 2024-04-02 (×2): qty 90, 90d supply, fill #0

## 2024-03-14 MED ORDER — DEXCOM G7 SENSOR MISC
3.0000 | 3 refills | Status: AC
  Filled 2024-03-14: qty 9, 90d supply, fill #0
  Filled 2024-04-17 – 2024-06-08 (×2): qty 9, 90d supply, fill #1

## 2024-03-14 NOTE — Addendum Note (Signed)
 Addended by: ARELIA DIETRICH SAILOR on: 03/14/2024 01:35 PM   Modules accepted: Orders

## 2024-03-14 NOTE — Patient Instructions (Addendum)
 Please use the following pump settings: - basal: 12 AM: 1.30 3 AM: 1.30 6:30 AM: 1.50 8 AM: 1.75 10 AM: 1.65 >> 1.75 12 PM: 1:35 >> 1:45 4 PM: 1.15 >> 1.25 8 pm: 1.20 >> 1.30 10:30 PM: 1.15 >> 1.25 - bolus:  - Insulin  to carb ratio:  12 am: 1:5 12 pm: 1:4   Try to change the infusion sites.  Please continue: - Metformin  1000 mg 2x with meals  - Farxiga  5 mg before breakfast  Try to start: - Mounjaro 2.5 mg weekly If we cannot use Mounjaro, let's try again: - Ozempic 0.25 mg weekly and increase the dose as tolerated  On the Ozempic 1 mg pen: - 18 clicks  - 0.25 mg - 36 clicks - 0.5 mg - 54 clicks - 0.75 mg - 72 clicks - 1 mg  Please come back for a follow-up appointment in 4-6 months.

## 2024-03-14 NOTE — Progress Notes (Addendum)
 Patient ID: Zachary Graham, male   DOB: 07/15/1960, 63 y.o.   MRN: [id number]   HPI: Zachary Graham is a 63 y.o.-year-old male, presenting for f/u for DM1, dx 1993, insulin -dependent, uncontrolled, with complications (mild sensory peripheral neuropathy; hypoglycemia episodes, DR). Last visit 7 months ago.  Interim history: No increased urination, blurry vision, nausea.   He had to come off Adderall  before last visit due to PVCs - he had a 3-day heart monitor and was found to have frequent PVCs but for now, no intervention is needed. He continues to have long hours at work and significantly increased stress and burnout.   [facility] on [facility]em[facility] 6 weeks at our last appointment.  Before last visit he has lost 20 pounds.  However, he started to develop nausea and food intolerance after increasing the dose of Ozempic so he stopped.  He gained 17 pounds since then.  Insulin  pump: - Started in 2006 - Medtronic 670G pump - now t:slim x2 - started beginning of 10/2021, replaced in 12/2022  CGM: -Libre CGM as a backup -now Dexcom G6 >> G7 CGM  Insulin : - Prev. Humalog  >> Lyumjev   (50 vials for 3 months).  He initially lost 15 pounds after switching from Humalog  to Lyumjev . - before last visit, he wanted to get back to NovoLog  due to Lyumjev  not being approved in the pump  - then Lyumjev  again, but this was causing burning at the infusion site  - then NovoLog   - now Humalog   Reviewed HbA1c levels: Lab Results  Component Value Date   HGBA1C 8.4 (H) 01/11/2024   HGBA1C 8.0 (H) 08/08/2023   HGBA1C 8.0 (H) 02/02/2023   HGBA1C 8.0 01/25/2023   HGBA1C 7.4 (A) 09/24/2022   HGBA1C 7.1 (A) 03/26/2022   HGBA1C 7.6 (H) 01/27/2022   HGBA1C 7.4 (A) 11/30/2021   HGBA1C 7.8 (A) 07/24/2021   HGBA1C 8.0 12/27/2020   HGBA1C 7.6 (A) 12/05/2020   HGBA1C 8.3 (H) 10/28/2020   HGBA1C 7.2 (A) 07/24/2020   HGBA1C 7.2 (A) 03/26/2020   HGBA1C 7.3 (A) 11/20/2019   HGBA1C 7.4 (A) 08/02/2019   HGBA1C 7.4 (A)  04/06/2019   HGBA1C 7.6 (A) 12/08/2018   HGBA1C 8.2 (H) 10/19/2018   HGBA1C 7.7 (A) 05/02/2018  07/26/2022: HbA1c 7.7% 10/2019: HbA1c calculated from fructosamine: 6.3%  Pump settings: He did not make the recommended pump changes (in bold) from last visit: - basal: 12 AM: 1.30  6:30 AM: 1.50 8 AM: 1.75 10 AM: 1.65  10 AM: 1.65 12 PM: 1:35 4 PM: 1.15 8 pm: 1.2010:30 PM: 1.15 - bolus:  - Insulin  to carb ratio:  12 am: 1:5 12 pm: 1:4  - Insulin  sensitivity factor:  25 >> 20  - target: 120-120 - Active insulin  time: 3 hours  TDD basal: 33% >> 33% >> 40% >> 29% (36 units) TDD bolus: 67% >> 67% >> 60% >> 71% (88 units) TDD 122 units >> 210-240  >> 117-140 >> 85-115 >> 124-140 units a day - Bolus wizard: On - Changes the pump site:  every 1-2 >> every 2-3 days   He is also on:  - Metformin  500 >> 1000 mg twice a day - Farxiga  5 mg before breakfast-started 07/2021 - Ozempic 0.5 mg weekly - [facility] We tried Trulicy in 09/2018 but he stopped in 02/2019 due to nausea and abdominal pain. He was previously on Actos  started 12/2019, but stopped.  He checks his sugars more than 4 times  a day with his Dexcom CGM:   Previously:    Lowest: 40 >> ...50s. Highest: 400 >> .SABRA. 300s. He has a history of a severe hypoglycemic episode in 2016.  He has a glucagon  kit at home (refilled by the [facility]). No previous DKA admissions.  -No CKD, last BUN/creatinine:  Lab Results  Component Value Date   BUN 16 01/11/2024   CREATININE 0.86 01/11/2024   Lab Results  Component Value Date   MICRALBCREAT Unable to calculate 08/08/2023   MICRALBCREAT 0.0 10/29/2022  On Cozaar .  -+ HL; last set of lipids: Lab Results  Component Value Date   CHOL 108 01/11/2024   HDL 42.60 01/11/2024   LDLCALC 38 01/11/2024   LDLDIRECT 87.0 12/02/2016   TRIG 136.0 01/11/2024   CHOLHDL 3 01/11/2024  On high-dose Lipitor.  - last eye exam was: 06/08/2023: No DR  Previously + Mild NPDR. [facility].   -+ Numbness and tingling in his feet.  This has improved on the B complex.  Last foot exam-08/08/2023. B12 was normal, 855, on 01/28/2021.  He also has a history of GERD, HTN, OSA.  Latest TSH was normal: Lab Results  Component Value Date   TSH 3.30 08/08/2023  03/19/2022: TSH 2.47 04/11/2018: TSH 3.02  He has a history of adhesive capsulitis >> improved.  Previously getting occasional steroid injections but we discussed to reduce these as much as possible.  On meloxicam .  ROS: + see HPI  I reviewed pt's medications, allergies, PMH, social hx, family hx, and changes were documented in the history of present illness. Otherwise, unchanged from my initial visit note.  Past Medical History:  Diagnosis Date   Diabetes mellitus    GERD (gastroesophageal reflux disease)    Heart murmur    Hyperlipidemia    Hypertension    OSA (obstructive sleep apnea)    Pneumonia    Past Surgical History:  Procedure Laterality Date   CERVICAL DISC ARTHROPLASTY N/A 03/16/2017   Procedure: CERVICAL FIVE- CERVICAL SIX DISC ARTHROPLASTY;  Surgeon: Alix Charleston, MD;  Location: [facility] OR;  Service: Neurosurgery;  Laterality: N/A;  CERVICAL 5- CERVICAL 6 DISC ARTHROPLASTY   COLONOSCOPY     TONSILLECTOMY     Social History   Socioeconomic History   Marital status: Married    Spouse name: Not on file   Number of children: 3   Years of education: Not on file   Highest education level: Master's degree (e.g., MA, MS, MEng, MEd, MSW, MBA)  Occupational History   Occupation: RADIATION SAFETY OFFICER    Employer: [employer]  Tobacco Use   Smoking status: Never    Passive exposure: Never   Smokeless tobacco: Never  Vaping Use   Vaping status: Never Used  Substance and Sexual Activity   Alcohol use: No    Alcohol/week: 0.0 standard drinks of alcohol   Drug use: No   Sexual activity: Yes    Partners: Female  Other Topics Concern   Not on file  Social History Narrative   Regular  exercise: runs 3 miles 3x week, takes one day off per week to rest   Caffeine use: stopped all diet soda on 04/23/15 per advice from Sports MD   Social Drivers of Health   Financial Resource Strain: Low Risk  (01/08/2024)   Overall Financial Resource Strain (CARDIA)    Difficulty of Paying Living Expenses: Not hard at all  Food Insecurity: No Food Insecurity (01/08/2024)   Hunger Vital Sign  Worried About Programme Researcher, Broadcasting/film/video in the Last Year: Never true    Ran Out of Food in the Last Year: Never true  Transportation Needs: No Transportation Needs (01/08/2024)   PRAPARE - Administrator, Civil Service (Medical): No    Lack of Transportation (Non-Medical): No  Physical Activity: Sufficiently Active (01/08/2024)   Exercise Vital Sign    Days of Exercise per Week: 4 days    Minutes of Exercise per Session: 50 min  Stress: No Stress Concern Present (01/08/2024)   [employer] of Occupational Health - Occupational Stress Questionnaire    Feeling of Stress: Not at all  Social Connections: Moderately Isolated (01/08/2024)   Social Connection and Isolation Panel    Frequency of Communication with Friends and Family: Once a week    Frequency of Social Gatherings with Friends and Family: Once a week    Attends Religious Services: Patient declined    Database Administrator or Organizations: Yes    Attends Banker Meetings: 1 to 4 times per year    Marital Status: Married  Catering Manager Violence: Not on file   Current Outpatient Medications on File Prior to Visit  Medication Sig Dispense Refill   Alpha-Lipoic Acid 600 MG CAPS Take 1 capsule by mouth daily.      Ascorbic Acid (VITAMIN C ) 1000 MG tablet Take 1,000 mg by mouth daily.     aspirin EC 81 MG tablet Take by mouth daily.     atorvastatin  (LIPITOR) 80 MG tablet Take 1 tablet (80 mg total) by mouth daily. 90 tablet 1   azelastine  (ASTELIN ) 0.1 % nasal spray Place 2 sprays into both nostrils 2 (two) times  daily. Use in each nostril as directed 90 mL 1   b complex vitamins tablet Take 1 tablet by mouth daily.     BAYER MICROLET LANCETS lancets USE TO TEST BLOOD SUGAR 6 TIMES A DAY 300 each 11   Beclomethasone Dipropionate  (QNASL ) 80 MCG/ACT AERS Place 2 puffs into the nose daily as needed (allergies). 10.6 g 5   Calcium -Magnesium -Vitamin D  (CALCIUM  500 PO) Take 1 tablet by mouth daily.      clotrimazole -betamethasone  (LOTRISONE ) cream Apply to affected areas 2 (two) times daily. STOP USING AFTER 2 WEEKS 45 g 1   Continuous Glucose Sensor (DEXCOM G7 SENSOR) MISC Apply 1 sensor every 10 days 9 each 3   cyclobenzaprine  (FLEXERIL ) 10 MG tablet Take 1 tablet (10 mg total) by mouth 3 (three) times daily as needed for muscle spasms. 30 tablet 0   dapagliflozin  propanediol (FARXIGA ) 5 MG TABS tablet Take 1 tablet (5 mg total) by mouth daily before breakfast. 90 tablet 3   EPINEPHrine  0.3 mg/0.3 mL IJ SOAJ injection Inject 0.3 mg into the muscle as needed for anaphylaxis. 1 each PRN   famotidine (PEPCID) 40 MG tablet Take 1 tablet by mouth at bedtime.     ferrous sulfate  325 (65 FE) MG tablet Take 1 tablet (325 mg total) by mouth daily with breakfast. 90 tablet 0   Glucagon  3 MG/DOSE POWD Place 3 mg into the nose once as needed for up to 1 dose. 1 each 11   glucose blood (CONTOUR NEXT TEST) test strip USE AS INSTRUCTED TO CHECK SUGAR 3 TIMES DAILY 300 strip 5   GVOKE HYPOPEN  1-PACK 1 MG/0.2ML SOAJ Inject under skin 0.2 mL as needed for hypoglycemia 0.2 mL PRN   hydrOXYzine  (ATARAX ) 10 MG tablet Take 1 tablet by mouth at  bedtime as needed. 90 tablet 0   insulin  glargine-yfgn (SEMGLEE) 100 UNIT/ML injection Inject into the skin.     insulin  lispro (HUMALOG ) 100 UNIT/ML injection Inject up to 150 units into insulin  pump daily. 50 mL 3   ipratropium (ATROVENT) 0.06 % nasal spray Place 2 sprays into the nose as needed for rhinitis.     levocetirizine (XYZAL ) 5 MG tablet Take 1 tablet (5 mg total) by mouth every  evening. 90 tablet 1   losartan  (COZAAR ) 50 MG tablet Take 1 tablet (50 mg total) by mouth daily. 90 tablet 1   Magnesium  Oxide 400 MG CAPS Take 1 capsule (400 mg total) by mouth daily. 90 capsule 1   metFORMIN  (GLUCOPHAGE ) 500 MG tablet Take 2 tablets (1,000 mg total) by mouth 2 (two) times daily before a meal. 360 tablet 1   metoprolol  succinate (TOPROL  XL) 25 MG 24 hr tablet Take 1 tablet (25 mg total) by mouth daily. 90 tablet 3   NON FORMULARY CPAP     omeprazole (PRILOSEC) 40 MG capsule Take 20 mg by mouth daily.     Semaglutide,0.25 or 0.5MG /DOS, 2 MG/3ML SOPN Inject into the skin once a week.     tiZANidine  (ZANAFLEX ) 4 MG tablet Take 1 tablet (4 mg total) by mouth at bedtime. 30 tablet 0   traZODone  (DESYREL ) 50 MG tablet Take 1 tablet (50 mg total) by mouth at bedtime. 90 tablet 1   [DISCONTINUED] insulin  aspart (NOVOLOG ) 100 UNIT/ML injection Inject max 150 units daily 50 mL 3   No current facility-administered medications on file prior to visit.   Allergies  Allergen Reactions   Egg Protein-Containing Drug Products Nausea And Vomiting   Lisinopril Swelling and Other (See Comments)    Extreme facial swelling causing hospitalization   Shellfish Allergy Anaphylaxis   Zetia  [Ezetimibe ] Other (See Comments)    ABDOMINAL CRAMPING   Dulaglutide  Other (See Comments)   Gabapentin     Other reaction(s): Dizziness   Gabapentin (Once-Daily) Other (See Comments)   Iodine    Penicillins Other (See Comments)    UNSPECIFIED REACTION > Childhood allergy     Cymbalta  [Duloxetine  Hcl] Other (See Comments)    Severe feet cramps   Family History  Problem Relation Age of Onset   Colon cancer Maternal Grandfather    Colon cancer Paternal Grandfather    Heart disease Father        Valve replacement; pacemaker   Alcohol abuse Neg Hx    Asthma Neg Hx    Diabetes Neg Hx    Hyperlipidemia Neg Hx    Hypertension Neg Hx    Kidney disease Neg Hx    Stroke Neg Hx    PE: BP 118/70   Pulse  66   Resp 20   Ht 5' 9.5 (1.765 m)   Wt 238 lb 9.6 oz (108.2 kg)   SpO2 97%   BMI 34.73 kg/m    Wt Readings from Last 10 Encounters:  03/14/24 238 lb 9.6 oz (108.2 kg)  03/02/24 234 lb (106.1 kg)  01/23/24 229 lb (103.9 kg)  01/18/24 233 lb (105.7 kg)  01/11/24 233 lb (105.7 kg)  11/21/23 222 lb (100.7 kg)  09/21/23 219 lb (99.3 kg)  09/12/23 219 lb (99.3 kg)  08/12/23 221 lb 6.4 oz (100.4 kg)  08/08/23 219 lb 6.4 oz (99.5 kg)   Constitutional: overweight, in NAD Eyes: EOMI, no exophthalmos ENT:  no thyromegaly, no cervical lymphadenopathy Cardiovascular: RRR, No MRG Respiratory: CTA B  Musculoskeletal: no deformities Skin: no rashes Neurological: no tremor with outstretched hands  ASSESSMENT: 1. DM1, insulin -dependent, uncontrolled, without complications - DR - PN  - Barriers to good control: Very busy schedule >> less time to check sugars and eat but But he does a great job with bolusing during the day >> still has a lot of large boluses during the day, sometimes without documented carbs or sugars Lack of sleep >> usually sleeps max 5h a night (!) and tries to catch up in the weekend Reaches home late at night  - sometimes at 1 am >> eats >65% of the daily calories then  2. HL  3.  Obesity class I  4. OSA  PLAN:  1. Patient with longstanding, uncontrolled, type 1 diabetes, with high insulin  resistance, managed on the t:slim X2 insulin  pump integrated with the Dexcom CGM.  At last visit, HbA1c was stable, at 8.0%, but 2 months ago he had higher HbA1c, at 8.4%.  At last visit, he had worse sugars after he had to come off Adderall  due to PVCs.  Reviewing the CGM trends, sugars started to improve in the 2 weeks prior to our last visit after starting Ozempic, as recommended at the [facility] 6 weeks prior to the appointment.  He had quite a bit of nausea at the beginning but this started to improve.  He was not able to eat fried foods or larger meals, and had to increase his  insulin  doses while on Ozempic.  Sugars were still high after lunch as he was not entering carbs and bolusing appropriately for meals.  He was doing manual boluses and manual corrections and we discussed that these were not ideal.  We made several adjustments in his basal rates (increased), and we discussed that he probably would need to relax his insulin  to carb ratios after Ozempic stabilized in his system.  Unfortunately, I do increase the dose Ozempic, he developed a food intolerance and nausea and stopped approximately 1 to 1.5 months ago. CGM interpretation: -At today's visit, we reviewed his CGM downloads: It appears that 56% of values are in target range (goal >70%), while 43% are higher than 180 (goal <25%), and 0.4% are lower than 70 (goal <4%).  The calculated average blood sugar is 180.  The projected HbA1c for the next 3 months (GMI) is 7.6%. -Reviewing the CGM trends, sugars appear to be fluctuating around the upper limit of the target range, but with frequent excursions above the target range even when he is bolusing for meals..  A persistent trend is that he is entering a low amount of carbs into the pump before meals and then enters more when the sugars began high after the meal.  We discussed that ideally he would prebolus before each meal for all of the carbs that he plans to eat.  Upon reviewing the pump settings, he did not enter the basal rate suggestions made at last visit, but he is using a more relaxed insulin  to carb ratio in the morning, until lunchtime.  The previous relaxed insulin  to carb ratios were adjusted in the setting of Ozempic, but he is off Ozempic now and sugars do appear to be higher than before.  They are particularly worse in the last 2 weeks and he has instances in which he is repeatedly bolusing insulin  without improvement in blood sugars.  We discussed that this is an indication of a site problem.  Upon questioning, he is using a relatively narrow area of his  upper  abdomen for his infusion site and I recommended to rotate the sites more and maybe use the lateral side of his chest over his thighs.  I still recommended to increase his basal rates after 10 AM to midnight but we also discussed about possibly restarting an incretin mimetic.  He was advised to try Mounjaro by his [facility] provider, but this was not covered by the [facility].  He would like to try this through the pharmacy.  I did advise him to start at a low dose and increase as tolerated.  If he is not able to tolerate this or if he is not able to obtain this, we could try Ozempic at a lower dose and increase it very slowly.  He agrees with this plan. - I advised him to:  Patient Instructions  Please use the following pump settings: - basal: 12 AM: 1.30 3 AM: 1.30 6:30 AM: 1.50 8 AM: 1.75 10 AM: 1.65 >> 1.75 12 PM: 1:35 >> 1:45 4 PM: 1.15 >> 1.25 8 pm: 1.20 >> 1.30 10:30 PM: 1.15 >> 1.25 - bolus:  - Insulin  to carb ratio:  12 am: 1:5 12 pm: 1:4   Try to change the infusion sites.  Please continue: - Metformin  1000 mg 2x with meals  - Farxiga  5 mg before breakfast  Try to start: - Mounjaro 2.5 mg weekly If we cannot use Mounjaro, let's try again: - Ozempic 0.25 mg weekly and increase the dose as tolerated  On the Ozempic 1 mg pen: - 18 clicks  - 0.25 mg - 36 clicks - 0.5 mg - 54 clicks - 0.75 mg - 72 clicks - 1 mg  Please come back for a follow-up appointment in 4-6 months.  - we checked his HbA1c: 7.6% (improved) - advised to check sugars at different times of the day - 4x a day, rotating check times - advised for yearly eye exams >> he is UTD - return to clinic in 4-6 months  2. HL - Latest lipid fractions were all at goal, Lab Results  Component Value Date   CHOL 108 01/11/2024   HDL 42.60 01/11/2024   LDLCALC 38 01/11/2024   LDLDIRECT 87.0 12/02/2016   TRIG 136.0 01/11/2024   CHOLHDL 3 01/11/2024  -He is on Lipitor 80 mg daily without side effects  3.  Obesity class I and  4. OSA - Complicated with OSA - He would greatly benefit from tirzepatide from the obesity, OSA, and diabetes point of view  Lela Fendt, MD PhD [facility] Endocrin[facility]

## 2024-03-15 ENCOUNTER — Other Ambulatory Visit: Payer: Self-pay

## 2024-03-15 ENCOUNTER — Other Ambulatory Visit (HOSPITAL_COMMUNITY): Payer: Self-pay

## 2024-03-15 ENCOUNTER — Telehealth: Payer: Self-pay

## 2024-03-15 DIAGNOSIS — E66811 Obesity, class 1: Secondary | ICD-10-CM

## 2024-03-15 DIAGNOSIS — G4733 Obstructive sleep apnea (adult) (pediatric): Secondary | ICD-10-CM

## 2024-03-15 NOTE — Telephone Encounter (Signed)
 Pharmacy Patient Advocate Encounter   Received notification from Pt Calls Messages that prior authorization for Mounjaro 2.5MG /0.5ML auto-injectors is required/requested.   Insurance verification completed.   The patient is insured through [facility].   Ozempic/Mounjaro is approved exclusively as an adjunct to diet and exercise to improve glycemic  control in adults with type 2 diabetes mellitus. A review of patient's medical chart reveals no  documented diagnosis of type 2 diabetes. Therefore, they do not  currently meet the criteria for prior authorization of this medication.   Please be advised that while [facility] and zepbound are alternatives, pt's plan does not cover them.

## 2024-03-17 ENCOUNTER — Other Ambulatory Visit (HOSPITAL_COMMUNITY): Payer: Self-pay

## 2024-03-19 ENCOUNTER — Encounter (HOSPITAL_COMMUNITY): Payer: Self-pay

## 2024-03-19 ENCOUNTER — Other Ambulatory Visit (HOSPITAL_COMMUNITY): Payer: Self-pay

## 2024-03-19 MED ORDER — TIRZEPATIDE-WEIGHT MANAGEMENT 2.5 MG/0.5ML ~~LOC~~ SOAJ
2.5000 mg | SUBCUTANEOUS | 3 refills | Status: AC
  Filled 2024-03-19: qty 2, 28d supply, fill #0
  Filled 2024-03-20: qty 6, 84d supply, fill #0

## 2024-03-19 NOTE — Addendum Note (Signed)
 Addended by: CLEOTILDE ROLIN RAMAN on: 03/19/2024 02:00 PM   Modules accepted: Orders

## 2024-03-19 NOTE — Telephone Encounter (Signed)
 I added a diagnosis of obesity class I and OSA to the last visit.  Let's send a new Rx for tirzepatide 2.5 mg weekly (Zepbound) along with the  2 diagnoses.  Ty! C

## 2024-03-20 ENCOUNTER — Other Ambulatory Visit (HOSPITAL_COMMUNITY): Payer: Self-pay

## 2024-03-21 ENCOUNTER — Other Ambulatory Visit (HOSPITAL_COMMUNITY): Payer: Self-pay

## 2024-03-22 ENCOUNTER — Encounter: Payer: Self-pay | Admitting: Internal Medicine

## 2024-03-22 ENCOUNTER — Ambulatory Visit (INDEPENDENT_AMBULATORY_CARE_PROVIDER_SITE_OTHER)

## 2024-03-22 DIAGNOSIS — Z23 Encounter for immunization: Secondary | ICD-10-CM

## 2024-03-22 NOTE — Progress Notes (Signed)
 After obtaining consent, and per orders of Dr. Joshua, injection of Hep B#2, given by Edsel CHRISTELLA Kerns. Patient tolerated well.

## 2024-03-23 ENCOUNTER — Other Ambulatory Visit (HOSPITAL_COMMUNITY): Payer: Self-pay

## 2024-03-26 ENCOUNTER — Other Ambulatory Visit (HOSPITAL_COMMUNITY): Payer: Self-pay

## 2024-04-02 ENCOUNTER — Other Ambulatory Visit (HOSPITAL_COMMUNITY): Payer: Self-pay

## 2024-04-05 ENCOUNTER — Other Ambulatory Visit (HOSPITAL_COMMUNITY): Payer: Self-pay

## 2024-04-17 ENCOUNTER — Other Ambulatory Visit (HOSPITAL_COMMUNITY): Payer: Self-pay

## 2024-04-30 NOTE — Progress Notes (Unsigned)
 [facility] [address] [place] [address] Phone: [phone] Subjective:   Zachary Graham, am serving as a scribe for Zachary Graham.  I'm seeing this patient by the request  of:  Zachary Debby CROME, MD  CC: Back and neck pain follow-up  YEP:Dlagzrupcz  Zachary Graham is a 63 y.o. male coming in with complaint of back and neck pain. OMT on 03/02/2024. Patient states same per usual. No new symptoms. Sometimes gets a pain in L left and worried about it.  Medications patient has been prescribed: zanaflex   Taking:         Reviewed prior external information including notes and imaging from previsou exam, outside providers and external EMR if available.   As well as notes that were available from care everywhere and other healthcare systems.  Past medical history, social, surgical and family history all reviewed in electronic medical record.  No pertanent information unless stated regarding to the chief complaint.   Past Medical History:  Diagnosis Date   Diabetes mellitus    GERD (gastroesophageal reflux disease)    Heart murmur    Hyperlipidemia    Hypertension    OSA (obstructive sleep apnea)    Pneumonia     Allergies[1]   Review of Systems:  No headache, visual changes, nausea, vomiting, diarrhea, constipation, dizziness, abdominal pain, skin rash, fevers, chills, night sweats, weight loss, swollen lymph nodes, body aches, joint swelling, chest pain, shortness of breath, mood changes. POSITIVE muscle aches  Objective  Blood pressure 128/68, pulse 80, height 5' 9 (1.753 m), weight 221 lb (100.2 kg), SpO2 96%.   General: No apparent distress alert and oriented x3 mood and affect normal, dressed appropriately.  HEENT: Pupils equal, extraocular movements intact  Respiratory: Patient's speak in full sentences and does not appear short of breath  Cardiovascular: No lower extremity edema, non tender, no erythema  Gait MSK:  Back  does have some loss of lordosis noted.  Some tenderness to palpation in the paraspinal musculature.  Osteopathic findings  T3 extended rotated and side bent right  T9 extended rotated and side bent left L2 flexed rotated and side bent right L3 flexed rotated sidebent left Sacrum left on left       Assessment and Plan:  Low back pain Multifactorial.  Discussed with patient about home exercises and icing regimen.  Increase activity slowly.  Discussed which activities to do and which ones to avoid.  Increase activity slowly.  Follow-up again in 6 to 8 weeks    Nonallopathic problems  Decision today to treat with OMT was based on Physical Exam  After verbal consent patient was treated with HVLA, ME, FPR techniques in thoracic, lumbar, and sacral  areas  Patient tolerated the procedure well with improvement in symptoms  Patient given exercises, stretches and lifestyle modifications  See medications in patient instructions if given  Patient will follow up in 4-8 weeks     The above documentation has been reviewed and is accurate and complete Zachary Catterton M Taivon Haroon, DO         Note: This dictation was prepared with Dragon dictation along with smaller phrase technology. Any transcriptional errors that result from this process are unintentional.            [1]  Allergies Allergen Reactions   Egg Protein-Containing Drug Products Nausea And Vomiting   Lisinopril Swelling and Other (See Comments)    Extreme facial swelling causing hospitalization   Shellfish  Allergy Anaphylaxis   Zetia  [Ezetimibe ] Other (See Comments)    ABDOMINAL CRAMPING   Dulaglutide  Other (See Comments)   Gabapentin     Other reaction(s): Dizziness   Gabapentin (Once-Daily) Other (See Comments)   Iodine    Penicillins Other (See Comments)    UNSPECIFIED REACTION > Childhood allergy     Cymbalta  [Duloxetine  Hcl] Other (See Comments)    Severe feet cramps

## 2024-05-02 ENCOUNTER — Ambulatory Visit: Admitting: Family Medicine

## 2024-05-02 ENCOUNTER — Encounter: Payer: Self-pay | Admitting: Family Medicine

## 2024-05-02 VITALS — BP 128/68 | HR 80 | Ht 69.0 in | Wt 221.0 lb

## 2024-05-02 DIAGNOSIS — G8929 Other chronic pain: Secondary | ICD-10-CM | POA: Diagnosis not present

## 2024-05-02 DIAGNOSIS — M545 Low back pain, unspecified: Secondary | ICD-10-CM | POA: Diagnosis not present

## 2024-05-02 DIAGNOSIS — M9902 Segmental and somatic dysfunction of thoracic region: Secondary | ICD-10-CM

## 2024-05-02 DIAGNOSIS — M9903 Segmental and somatic dysfunction of lumbar region: Secondary | ICD-10-CM

## 2024-05-02 DIAGNOSIS — M9904 Segmental and somatic dysfunction of sacral region: Secondary | ICD-10-CM | POA: Diagnosis not present

## 2024-05-02 NOTE — Patient Instructions (Signed)
 Good to see you! See you again in 2 months

## 2024-05-02 NOTE — Assessment & Plan Note (Signed)
 Multifactorial.  Discussed with patient about home exercises and icing regimen.  Increase activity slowly.  Discussed which activities to do and which ones to avoid.  Increase activity slowly.  Follow-up again in 6 to 8 weeks

## 2024-05-07 ENCOUNTER — Other Ambulatory Visit (HOSPITAL_COMMUNITY): Payer: Self-pay

## 2024-05-14 ENCOUNTER — Other Ambulatory Visit (HOSPITAL_COMMUNITY): Payer: Self-pay

## 2024-06-08 ENCOUNTER — Other Ambulatory Visit (HOSPITAL_COMMUNITY): Payer: Self-pay

## 2024-06-08 ENCOUNTER — Other Ambulatory Visit: Payer: Self-pay

## 2024-06-13 ENCOUNTER — Other Ambulatory Visit (HOSPITAL_COMMUNITY): Payer: Self-pay

## 2024-06-15 ENCOUNTER — Other Ambulatory Visit (HOSPITAL_COMMUNITY): Payer: Self-pay

## 2024-07-04 ENCOUNTER — Ambulatory Visit: Admitting: Family Medicine

## 2024-07-11 ENCOUNTER — Ambulatory Visit: Admitting: Internal Medicine

## 2024-09-06 ENCOUNTER — Ambulatory Visit: Admitting: Internal Medicine
